# Patient Record
Sex: Male | Born: 1955
Health system: Southern US, Community
[De-identification: ages and names within clinical notes are randomized; demographics above are authoritative.]

## PROBLEM LIST (undated history)

## (undated) DIAGNOSIS — C61 Malignant neoplasm of prostate: Principal | ICD-10-CM

## (undated) DIAGNOSIS — C419 Malignant neoplasm of bone and articular cartilage, unspecified: Secondary | ICD-10-CM

## (undated) DIAGNOSIS — I82409 Acute embolism and thrombosis of unspecified deep veins of unspecified lower extremity: Secondary | ICD-10-CM

## (undated) DIAGNOSIS — C801 Malignant (primary) neoplasm, unspecified: Secondary | ICD-10-CM

## (undated) DIAGNOSIS — N200 Calculus of kidney: Secondary | ICD-10-CM

## (undated) DIAGNOSIS — Z800 Family history of malignant neoplasm of digestive organs: Secondary | ICD-10-CM

## (undated) DIAGNOSIS — M1990 Unspecified osteoarthritis, unspecified site: Secondary | ICD-10-CM

## (undated) DIAGNOSIS — Z8042 Family history of malignant neoplasm of prostate: Secondary | ICD-10-CM

## (undated) DIAGNOSIS — C7951 Secondary malignant neoplasm of bone: Secondary | ICD-10-CM

## (undated) HISTORY — DX: Secondary malignant neoplasm of bone: C79.51

## (undated) HISTORY — DX: Malignant neoplasm of prostate: C61

## (undated) HISTORY — DX: Family history of malignant neoplasm of digestive organs: Z80.0

## (undated) HISTORY — PX: CIRCUMCISION: SUR203

## (undated) HISTORY — DX: Family history of malignant neoplasm of prostate: Z80.42

---

## 2002-08-04 DIAGNOSIS — I82409 Acute embolism and thrombosis of unspecified deep veins of unspecified lower extremity: Secondary | ICD-10-CM

## 2002-08-04 HISTORY — DX: Acute embolism and thrombosis of unspecified deep veins of unspecified lower extremity: I82.409

## 2004-01-22 ENCOUNTER — Inpatient Hospital Stay (HOSPITAL_COMMUNITY): Admission: EM | Admit: 2004-01-22 | Discharge: 2004-01-27 | Payer: Self-pay | Admitting: Emergency Medicine

## 2006-11-05 ENCOUNTER — Ambulatory Visit (HOSPITAL_COMMUNITY): Admission: RE | Admit: 2006-11-05 | Discharge: 2006-11-05 | Payer: Self-pay | Admitting: *Deleted

## 2012-02-04 ENCOUNTER — Ambulatory Visit (HOSPITAL_COMMUNITY)
Admission: RE | Admit: 2012-02-04 | Discharge: 2012-02-04 | Disposition: A | Discharge status: Home / Self Care | Payer: PRIVATE HEALTH INSURANCE | Source: Ambulatory Visit | Attending: Internal Medicine | Admitting: Internal Medicine

## 2012-02-04 ENCOUNTER — Other Ambulatory Visit (HOSPITAL_COMMUNITY): Payer: Self-pay | Admitting: Internal Medicine

## 2012-02-04 DIAGNOSIS — M76899 Other specified enthesopathies of unspecified lower limb, excluding foot: Secondary | ICD-10-CM

## 2012-02-04 DIAGNOSIS — M79609 Pain in unspecified limb: Secondary | ICD-10-CM

## 2014-09-04 ENCOUNTER — Emergency Department (HOSPITAL_COMMUNITY): Payer: 59

## 2014-09-04 ENCOUNTER — Emergency Department (HOSPITAL_COMMUNITY)
Admission: EM | Admit: 2014-09-04 | Discharge: 2014-09-04 | Disposition: A | Discharge status: Home / Self Care | Payer: 59 | Attending: Emergency Medicine | Admitting: Emergency Medicine

## 2014-09-04 ENCOUNTER — Encounter (HOSPITAL_COMMUNITY): Payer: Self-pay | Admitting: *Deleted

## 2014-09-04 DIAGNOSIS — M1990 Unspecified osteoarthritis, unspecified site: Secondary | ICD-10-CM | POA: Diagnosis not present

## 2014-09-04 DIAGNOSIS — M25559 Pain in unspecified hip: Secondary | ICD-10-CM

## 2014-09-04 DIAGNOSIS — M549 Dorsalgia, unspecified: Secondary | ICD-10-CM | POA: Diagnosis not present

## 2014-09-04 DIAGNOSIS — M25552 Pain in left hip: Secondary | ICD-10-CM | POA: Insufficient documentation

## 2014-09-04 DIAGNOSIS — Z720 Tobacco use: Secondary | ICD-10-CM | POA: Insufficient documentation

## 2014-09-04 DIAGNOSIS — R200 Anesthesia of skin: Secondary | ICD-10-CM | POA: Insufficient documentation

## 2014-09-04 DIAGNOSIS — Z86718 Personal history of other venous thrombosis and embolism: Secondary | ICD-10-CM | POA: Insufficient documentation

## 2014-09-04 HISTORY — DX: Unspecified osteoarthritis, unspecified site: M19.90

## 2014-09-04 HISTORY — DX: Acute embolism and thrombosis of unspecified deep veins of unspecified lower extremity: I82.409

## 2014-09-04 MED ORDER — IBUPROFEN 800 MG PO TABS
800.0000 mg | ORAL_TABLET | Freq: Once | ORAL | Status: AC
  Administered 2014-09-04: 800 mg via ORAL
  Filled 2014-09-04: qty 1

## 2014-09-04 MED ORDER — IBUPROFEN 600 MG PO TABS: 600.0000 mg | ORAL_TABLET | Freq: Three times a day (TID) | ORAL | Status: DC | PRN

## 2014-09-04 MED ORDER — HYDROMORPHONE HCL 2 MG/ML IJ SOLN
2.0000 mg | Freq: Once | INTRAMUSCULAR | Status: AC
  Administered 2014-09-04: 2 mg via INTRAMUSCULAR
  Filled 2014-09-04: qty 1

## 2014-09-04 MED ORDER — HYDROCODONE-ACETAMINOPHEN 5-325 MG PO TABS: 1.0000 | ORAL_TABLET | ORAL | Status: DC | PRN

## 2014-09-04 NOTE — Discharge Instructions (Signed)

## 2014-09-04 NOTE — ED Provider Notes (Signed)
CSN: [id number]     Arrival date & time 09/04/14  2115 History  This chart was scribed for Matthew Cable, MD by Chester Holstein, ED Scribe. This patient was seen in room APA19/APA19 and the patient's care was started at 9:57 PM.    Chief Complaint  Patient presents with  . Hip Pain    Patient is a 59 y.o. male presenting with hip pain. The history is provided by the patient. No language interpreter was used.  Hip Pain This is a new problem. The current episode started more than 1 week ago. The problem occurs constantly. The problem has not changed since onset.Pertinent negatives include no abdominal pain. The symptoms are aggravated by bending and twisting. The symptoms are relieved by heat.    HPI Comments: TYJON BOWEN is a 59 y.o. male with PMHx of DVT, arthritis, and prostate problems, who presents to the Emergency Department complaining of left hip pain with onset 2.5 weeks. Pt notes he does heavy lifting at work. Pt notes pain radiates to back, flank, and down leg. Pt notes associated numbness in toes but denies weakness in legs. Pt has been using heating pads for relief.  Pt denies any known injury, weakness, urinary incontinence, cough,and abdominal pain.  Reports h/o DVT but has completed therapy in 2012   Past Medical History  Diagnosis Date  . DVT (deep venous thrombosis)   . Arthritis    History reviewed. No pertinent past surgical history. History reviewed. No pertinent family history. History  Substance Use Topics  . Smoking status: Current Every Day Smoker  . Smokeless tobacco: Not on file  . Alcohol Use: Yes     Comment: occ    Review of Systems  Respiratory: Negative for cough.   Gastrointestinal: Negative for abdominal pain.  Musculoskeletal: Positive for myalgias, back pain and arthralgias.  Neurological: Positive for numbness. Negative for weakness.  All other systems reviewed and are negative.     Allergies  Review of patient's allergies indicates  not on file.  Home Medications   Prior to Admission medications   Not on File   BP 131/87 mmHg  Pulse 92  Temp(Src) 98.6 F (37 C) (Oral)  Resp 20  Ht 6' 1.5" (1.867 m)  Wt 256 lb (116.121 kg)  BMI 33.31 kg/m2  SpO2 98% Physical Exam  Nursing note and vitals reviewed. CONSTITUTIONAL: Well developed/well nourished HEAD: Normocephalic/atraumatic EYES: EOMI/PERRL ENMT: Mucous membranes moist NECK: supple no meningeal signs SPINE/BACK:entire spine nontender, lumbar paraspinal tenderness CV: S1/S2 noted, no murmurs/rubs/gallops noted LUNGS: Lungs are clear to auscultation bilaterally, no apparent distress ABDOMEN: soft, nontender, no rebound or guarding, bowel sounds noted throughout abdomen GU:no cva tenderness NEURO: Pt is awake/alert/appropriate, moves all extremitiesx4.  No facial droop., equal distal motor 5/5 strength noted with the following: hip flexion/knee flexion/extension, ankle dorsi/plantar flexion, great toe extension intact bilaterally, no sensory deficit in any dermatome.   EXTREMITIES: pulses normal/equal, full ROM, tenderness to left hip, no bruising or erythema SKIN: warm, color normal PSYCH: no abnormalities of mood noted, alert and oriented to situation   ED Course  Procedures  DIAGNOSTIC STUDIES: Oxygen Saturation is 98% on room air, normal by my interpretation.    COORDINATION OF CARE: 10:00 PM Discussed treatment plan with patient at beside, the patient agrees with the plan and has no further questions at this time.  11:43 PM Pt improved He can ambulate unassisted No focal neuro deficits This may represent lumbar radiculopathy Doubt occult fx (no falls,  xray negative) Advised heating pad, short course of ibuprofen, vicodin and close PCP followup We discussed strict return precautions    Imaging Review Dg Lumbar Spine Complete  09/04/2014   CLINICAL DATA:  Left hip pain with radiation into left lower back for 3 weeks. No known injury.  EXAM:  LUMBAR SPINE - COMPLETE 4+ VIEW  COMPARISON:  None.  FINDINGS: Degenerative disc and facet disease in the lower lumbar spine. SI joints are symmetric and unremarkable. Normal alignment. No fracture.  IMPRESSION: No acute bony abnormality.   Electronically Signed   By: Rolm Baptise M.D.   On: 09/04/2014 22:53   Dg Hip Unilat With Pelvis 2-3 Views Left  09/04/2014   CLINICAL DATA:  Left hip pain and left lower back pain for 3 weeks. No known injury.  EXAM: LEFT HIP (WITH PELVIS) 2-3 VIEWS  COMPARISON:  None.  FINDINGS: Mild joint space narrowing in the hips bilaterally, symmetric. SI joints are symmetric and unremarkable.  No acute bony abnormality. Specifically, no fracture, subluxation, or dislocation. Soft tissues are intact.  IMPRESSION: No acute bony abnormality.   Electronically Signed   By: Rolm Baptise M.D.   On: 09/04/2014 22:55    Medications  HYDROmorphone (DILAUDID) injection 2 mg (2 mg Intramuscular Given 09/04/14 2216)  ibuprofen (ADVIL,MOTRIN) tablet 800 mg (800 mg Oral Given 09/04/14 2331)     MDM   Final diagnoses:  Hip pain  Hip pain, acute, left    Nursing notes including past medical history and social history reviewed and considered in documentation xrays/imaging reviewed by myself and considered during evaluation   I personally performed the services described in this documentation, which was scribed in my presence. The recorded information has been reviewed and is accurate.      Matthew Cable, MD 09/04/14 [phone]

## 2014-09-04 NOTE — ED Notes (Signed)
Pain left hip for 3 weeks. With radiation into back and down lt leg. No injury.

## 2015-12-10 ENCOUNTER — Emergency Department (HOSPITAL_COMMUNITY): Payer: 59

## 2015-12-10 ENCOUNTER — Emergency Department (HOSPITAL_COMMUNITY)
Admission: EM | Admit: 2015-12-10 | Discharge: 2015-12-10 | Disposition: A | Discharge status: Home / Self Care | Payer: 59 | Attending: Emergency Medicine | Admitting: Emergency Medicine

## 2015-12-10 ENCOUNTER — Encounter (HOSPITAL_COMMUNITY): Payer: Self-pay | Admitting: Emergency Medicine

## 2015-12-10 DIAGNOSIS — Z7982 Long term (current) use of aspirin: Secondary | ICD-10-CM | POA: Diagnosis not present

## 2015-12-10 DIAGNOSIS — Z791 Long term (current) use of non-steroidal anti-inflammatories (NSAID): Secondary | ICD-10-CM | POA: Diagnosis not present

## 2015-12-10 DIAGNOSIS — M899 Disorder of bone, unspecified: Secondary | ICD-10-CM | POA: Insufficient documentation

## 2015-12-10 DIAGNOSIS — F17210 Nicotine dependence, cigarettes, uncomplicated: Secondary | ICD-10-CM | POA: Insufficient documentation

## 2015-12-10 DIAGNOSIS — Z86718 Personal history of other venous thrombosis and embolism: Secondary | ICD-10-CM | POA: Insufficient documentation

## 2015-12-10 DIAGNOSIS — R0789 Other chest pain: Secondary | ICD-10-CM

## 2015-12-10 DIAGNOSIS — M1990 Unspecified osteoarthritis, unspecified site: Secondary | ICD-10-CM | POA: Insufficient documentation

## 2015-12-10 DIAGNOSIS — Z79899 Other long term (current) drug therapy: Secondary | ICD-10-CM | POA: Insufficient documentation

## 2015-12-10 LAB — URINALYSIS W MICROSCOPIC (NOT AT ARMC)
Bilirubin Urine: NEGATIVE
GLUCOSE, UA: NEGATIVE mg/dL
HGB URINE DIPSTICK: NEGATIVE
Ketones, ur: NEGATIVE mg/dL
Leukocytes, UA: NEGATIVE
NITRITE: NEGATIVE
PROTEIN: NEGATIVE mg/dL
Specific Gravity, Urine: <1.005 — ABNORMAL LOW (ref 1.005–1.030)
Squamous Epithelial / LPF: NONE SEEN
pH: 5.5 (ref 5.0–8.0)

## 2015-12-10 LAB — CBC
HCT: 42.2 % (ref 39.0–52.0)
HEMOGLOBIN: 14 g/dL (ref 13.0–17.0)
MCH: 28.9 pg (ref 26.0–34.0)
MCHC: 33.2 g/dL (ref 30.0–36.0)
MCV: 87.2 fL (ref 78.0–100.0)
PLATELETS: 242 10*3/uL (ref 150–400)
RBC: 4.84 MIL/uL (ref 4.22–5.81)
RDW: 13.2 % (ref 11.5–15.5)
WBC: 8.7 10*3/uL (ref 4.0–10.5)

## 2015-12-10 LAB — DIFFERENTIAL
BASOS PCT: 0 %
Basophils Absolute: 0 10*3/uL (ref 0.0–0.1)
EOS PCT: 3 %
Eosinophils Absolute: 0.3 10*3/uL (ref 0.0–0.7)
Lymphocytes Relative: 26 %
Lymphs Abs: 2.2 10*3/uL (ref 0.7–4.0)
MONO ABS: 0.7 10*3/uL (ref 0.1–1.0)
Monocytes Relative: 8 %
NEUTROS ABS: 5.5 10*3/uL (ref 1.7–7.7)
NEUTROS PCT: 63 %

## 2015-12-10 LAB — BASIC METABOLIC PANEL
ANION GAP: 12 (ref 5–15)
BUN: 10 mg/dL (ref 6–20)
CALCIUM: 8.8 mg/dL — AB (ref 8.9–10.3)
CO2: 23 mmol/L (ref 22–32)
CREATININE: 0.96 mg/dL (ref 0.61–1.24)
Chloride: 101 mmol/L (ref 101–111)
Glucose, Bld: 116 mg/dL — ABNORMAL HIGH (ref 65–99)
Potassium: 3.7 mmol/L (ref 3.5–5.1)
SODIUM: 136 mmol/L (ref 135–145)

## 2015-12-10 LAB — BRAIN NATRIURETIC PEPTIDE: B NATRIURETIC PEPTIDE 5: 19 pg/mL (ref 0.0–100.0)

## 2015-12-10 LAB — TROPONIN I: Troponin I: <0.03 ng/mL (ref <0.031)

## 2015-12-10 LAB — D-DIMER, QUANTITATIVE (NOT AT ARMC): D DIMER QUANT: 1.12 ug{FEU}/mL — AB (ref 0.00–0.50)

## 2015-12-10 MED ORDER — HYDROMORPHONE HCL 1 MG/ML IJ SOLN
0.5000 mg | Freq: Once | INTRAMUSCULAR | Status: AC
  Administered 2015-12-10: 0.5 mg via INTRAVENOUS
  Filled 2015-12-10: qty 1

## 2015-12-10 MED ORDER — IOPAMIDOL (ISOVUE-370) INJECTION 76%
100.0000 mL | Freq: Once | INTRAVENOUS | Status: AC | PRN
  Administered 2015-12-10: 100 mL via INTRAVENOUS

## 2015-12-10 MED ORDER — HYDROCODONE-ACETAMINOPHEN 5-325 MG PO TABS: 1.0000 | ORAL_TABLET | ORAL | Status: DC | PRN

## 2015-12-10 MED ORDER — FENTANYL CITRATE (PF) 100 MCG/2ML IJ SOLN
100.0000 ug | Freq: Once | INTRAMUSCULAR | Status: AC
  Administered 2015-12-10: 100 ug via INTRAVENOUS
  Filled 2015-12-10: qty 2

## 2015-12-10 NOTE — ED Provider Notes (Signed)
CSN: [id number]     Arrival date & time 12/10/15  [id number] History   First MD Initiated Contact with Patient 12/10/15 [phone]     Chief Complaint  Patient presents with  . Chest Pain     (Consider location/radiation/quality/duration/timing/severity/associated sxs/prior Treatment) HPI 60 year old male Who presents with chest pain. He has a history of DVT, no longer on anticoagulation and history of tobacco use. States onset of intermittent chest pressure and pain over the past 5 days. He has a very strenuous job working in [employer], and states that he has been doing a lot of physical labor. Pain has been off and on, worse with movement, and better when is is not moving. Not exacerbated by walking. 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 Denies any syncope, shortness of breath, diaphoresis, nausea or vomiting. 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 Has chronic left lower extremity edema since his DVT. This is not changed. No recent illness, including fever, cough, congestion.   Past Medical History  Diagnosis Date  . DVT (deep venous thrombosis) ([place])   . Arthritis    History reviewed. No pertinent past surgical history. History reviewed. No pertinent family history. Social History  Substance Use Topics  . Smoking status: Current Every Day Smoker -- 1.50 packs/day    Types: Cigarettes  . Smokeless tobacco: None  . Alcohol Use: Yes     Comment: occ    Review of Systems 10/14 systems reviewed and are negative other than those stated in the HPI    Allergies  Meloxicam  Home Medications   Prior to Admission medications   Medication Sig Start Date End Date Taking? Authorizing Provider  aspirin EC 81 MG tablet Take 81 mg by mouth daily.   Yes Historical Provider, MD  ibuprofen (ADVIL,MOTRIN) 600 MG tablet Take 300 mg by mouth 2 (two) times daily as needed for moderate pain.   Yes Historical Provider, MD   Liniments (BLUE-EMU SUPER STRENGTH EX) Apply 1 application topically 4 (four) times daily as needed (knee/hip pain).   Yes Historical Provider, MD  HYDROcodone-acetaminophen (NORCO/VICODIN) 5-325 MG tablet Take 1-2 tablets by mouth every 4 (four) hours as needed. 12/10/15   Forde Dandy, MD   BP 112/76 mmHg  Pulse 74  Temp(Src) 97.8 F (36.6 C) (Oral)  Resp 12  Ht 6\' 1"  (1.854 m)  Wt 255 lb (115.667 kg)  BMI 33.65 kg/m2  SpO2 96% Physical Exam Physical Exam  Nursing note and vitals reviewed. Constitutional: Well developed, well nourished, non-toxic, and appears uncomfortable Head: Normocephalic and atraumatic.  Mouth/Throat: Oropharynx is clear and moist.  Neck: Normal range of motion. Neck supple.  Cardiovascular: Normal rate and regular rhythm. LLE +1 edema  (he reports baseline) Pulmonary/Chest: Effort normal and breath sounds normal.  Abdominal: Soft. There is no tenderness. There is no rebound and no guarding.  Musculoskeletal: Normal range of motion.  Neurological: Alert, no facial droop, fluent speech, moves all extremities symmetrically Skin: Skin is warm and dry.  Psychiatric: Cooperative  ED Course  Procedures (including critical care time) Labs Review Labs Reviewed  BASIC METABOLIC PANEL - Abnormal; Notable for the following:    Glucose, Bld 116 (*)    Calcium 8.8 (*)    All other components within normal limits  D-DIMER, QUANTITATIVE (NOT AT [facility]) - Abnormal; Notable for the following:    D-Dimer, Quant 1.12 (*)    All other components within normal limits  URINALYSIS W MICROSCOPIC (NOT AT [facility]) - Abnormal; Notable for the following:    Specific Gravity, Urine <1.005 (*)    Bacteria, UA RARE (*)    All other components within normal limits  CBC  TROPONIN I  DIFFERENTIAL  BRAIN NATRIURETIC PEPTIDE  TROPONIN I    Imaging Review Dg Chest 2 View  12/10/2015  CLINICAL DATA:  Chest pressure for 5 days with progression while at work last night. Increasing  shortness of breath. Pain is worse with movement. EXAM: CHEST - 2 VIEW COMPARISON:  One-view chest x-ray 11/05/2006 FINDINGS: The heart size is normal. Emphysematous changes are again noted. Left upper lobe airspace opacity is most consistent with pneumonia. Multilevel degenerative changes are present in the thoracic spine. IMPRESSION: 1. Left upper lobe airspace disease compatible with pneumonia. 2. Emphysema. Electronically Signed   By: San Morelle M.D.   On: 12/10/2015 08:01   Ct Angio Chest Pe W/cm &/or Wo Cm  12/10/2015  CLINICAL DATA:  PT states center chest pressure x5 days and worsening last night while at work with SOB. PT states pain worse with movement EXAM: CT ANGIOGRAPHY CHEST WITH CONTRAST TECHNIQUE: Multidetector CT imaging of the chest was performed using the standard protocol during bolus administration of intravenous contrast. Multiplanar CT image reconstructions and MIPs were obtained to evaluate the vascular anatomy. CONTRAST:  100 mL Isovue 370 IV COMPARISON:  None. FINDINGS: Vascular: Left arm IV contrast injection. Innominate vein and SVC patent. Right atrium and right ventricle are nondilated. Satisfactory opacification of pulmonary arteries noted, and there is no evidence of pulmonary emboli. Patent bilateral pulmonary veins drain into left atrium. Scattered coronary calcifications. Adequate contrast opacification of the thoracic aorta with no evidence of dissection, aneurysm, or stenosis. There is classic 3-vessel brachiocephalic arch anatomy without proximal stenosis. Mild eccentric nonocclusive plaque in the descending thoracic aorta. Mediastinum/Lymph Nodes: No masses or pathologically enlarged lymph nodes identified. No pericardial effusion. Lungs/Pleura: No pneumothorax. No pulmonary mass, infiltrate, or effusion.Mild posterior dependent subsegmental atelectasis or scarring in the posterior left upper lobe and bilateral lower lobes. Upper abdomen: No acute findings.  Musculoskeletal: Mixed sclerotic/lytic lesions in multiple upper and mid thoracic vertebral bodies, with involvement of posterior elements extensively at T2, T3, and T4. Negative for pathologic fracture. Sclerotic lesions in left fourth, fifth, and sixth ribs. Subcentimeter sclerotic foci in the manubrium. Review of the MIP images confirms the above findings. IMPRESSION: 1. Negative for acute PE or thoracic aortic dissection. 2. Multiple thoracic and rib osseous lesions suggesting osseous metastatic disease. No primary evident. Follow-up recommended. These results will be called to the ordering clinician or representative by the Radiologist Assistant, and communication documented in the PACS or zVision Dashboard. 3. Atherosclerosis, including aortic and coronary artery disease. Please note that although the presence of coronary artery calcium documents the presence of coronary artery disease, the severity of this disease and any potential stenosis cannot be assessed on this non-gated CT examination. Assessment for potential risk factor modification, dietary therapy or pharmacologic therapy may be warranted, if clinically indicated. Electronically Signed   By: Lucrezia Europe M.D.   On: 12/10/2015 10:23   I have personally reviewed and evaluated these images and lab results as part of my medical decision-making.   EKG Interpretation   Date/Time:  Monday Dec 10 2015 07:09:46 EDT Ventricular Rate:  95 PR Interval:  182 QRS Duration: 90 QT Interval:  357 QTC Calculation: 449 R Axis:   47 Text Interpretation:  Sinus rhythm Low voltage, extremity  leads No acute  ischemic changes. No prior EKG  Confirmed by Brondon Wann MD, Arleny Kruger [phone]) on  12/10/2015 7:12:24 AM      MDM   Final diagnoses:  Atypical chest pain  Bone lesion    60 year old male with history of DVT and tobacco use who presents with 5 days of chest pain. On presentation is nontoxic and in no acute distress but appears to be uncomfortable secondary  to pain. His vital signs are stable. Cardiopulmonary exam overall unremarkable and he does have some left lower extremity leg swelling compared to the right which she states is baseline. Pain seems to be reproducible with movement and palpation over anterior chest wall.  His EKG is without heart strain or ischemic changes. Serial troponins are negative. Heart score of 2 for age and tobacco use. But pain seems atypical for that of ACS and more consistent with that of more musculoskeletal pain. I did screen for PE and he has a positive d-dimer. A CT PE was performed. He has no PE but evidence of bony lesions throughout his spine as well as ribs, which could also play a role in his chest pain. These lesions are concerning for possible cancer. The patient states long-standing history of stage issues including urinary hesitancy, frequency. his UA looks normal today. The pain has been well controlled with analgesics. He has plans to follow up very closely with his primary care doctor for workup of potential malignant cause and he is also given urology referral for workup of potential prostate malignancy as etiology. Strict return and follow-up instructions reviewed. He expressed understanding of all discharge instructions and felt comfortable with the plan of care.    Forde Dandy, MD 12/10/15 [phone]

## 2015-12-10 NOTE — Discharge Instructions (Signed)
Please return without fail for worsening symptoms, including worsening pain, difficulty breathing, passing out, or any other symptoms concerning to you. Please call your primary care doctor tomorrow to set up very close follow-up appointment for further workup and management. He is also been given urology follow-up for workup of prostate issues. We discussed that your CT chest today was concerning for bony lesions in the ribs and thoracic spine that could be cancerous in nature and Require further workup with her primary care doctor for this.  Chest Wall Pain Chest wall pain is pain in or around the bones and muscles of your chest. Sometimes, an injury causes this pain. Sometimes, the cause may not be known. This pain may take several weeks or longer to get better. HOME CARE INSTRUCTIONS  Pay attention to any changes in your symptoms. Take these actions to help with your pain:   Rest as told by your health care provider.   Avoid activities that cause pain. These include any activities that use your chest muscles or your abdominal and side muscles to lift heavy items.   If directed, apply ice to the painful area:  Put ice in a plastic bag.  Place a towel between your skin and the bag.  Leave the ice on for 20 minutes, 2-3 times per day.  Take over-the-counter and prescription medicines only as told by your health care provider.  Do not use tobacco products, including cigarettes, chewing tobacco, and e-cigarettes. If you need help quitting, ask your health care provider.  Keep all follow-up visits as told by your health care provider. This is important. SEEK MEDICAL CARE IF:  You have a fever.  Your chest pain becomes worse.  You have new symptoms. SEEK IMMEDIATE MEDICAL CARE IF:  You have nausea or vomiting.  You feel sweaty or light-headed.  You have a cough with phlegm (sputum) or you cough up blood.  You develop shortness of breath.   This information is not intended to  replace advice given to you by your health care provider. Make sure you discuss any questions you have with your health care provider.   Document Released: 07/21/2005 Document Revised: 04/11/2015 Document Reviewed: 10/16/2014 Elsevier Interactive Patient Education 2016 [employer]  Nonspecific Chest Pain It is often hard to find the cause of chest pain. There is always a chance that your pain could be related to something serious, such as a heart attack or a blood clot in your lungs. Chest pain can also be caused by conditions that are not life-threatening. If you have chest pain, it is very important to follow up with your doctor.  HOME CARE  If you were prescribed an antibiotic medicine, finish it all even if you start to feel better.  Avoid any activities that cause chest pain.  Do not use any tobacco products, including cigarettes, chewing tobacco, or electronic cigarettes. If you need help quitting, ask your doctor.  Do not drink alcohol.  Take medicines only as told by your doctor.  Keep all follow-up visits as told by your doctor. This is important. This includes any further testing if your chest pain does not go away.  Your doctor may tell you to keep your head raised (elevated) while you sleep.  Make lifestyle changes as told by your doctor. These may include:  Getting regular exercise. Ask your doctor to suggest some activities that are safe for you.  Eating a heart-healthy diet. Your doctor or a diet specialist (dietitian) can help you to  learn healthy eating options.  Maintaining a healthy weight.  Managing diabetes, if necessary.  Reducing stress. GET HELP IF:  Your chest pain does not go away, even after treatment.  You have a rash with blisters on your chest.  You have a fever. GET HELP RIGHT AWAY IF:  Your chest pain is worse.  You have an increasing cough, or you cough up blood.  You have severe belly (abdominal) pain.  You feel extremely  weak.  You pass out (faint).  You have chills.  You have sudden, unexplained chest discomfort.  You have sudden, unexplained discomfort in your arms, back, neck, or jaw.  You have shortness of breath at any time.  You suddenly start to sweat, or your skin gets clammy.  You feel nauseous.  You vomit.  You suddenly feel light-headed or dizzy.  Your heart begins to beat quickly, or it feels like it is skipping beats. These symptoms may be an emergency. Do not wait to see if the symptoms will go away. Get medical help right away. Call your local emergency services (911 in the [place] Do not drive yourself to the hospital.   This information is not intended to replace advice given to you by your health care provider. Make sure you discuss any questions you have with your health care provider.   Document Released: 01/07/2008 Document Revised: 08/11/2014 Document Reviewed: 02/24/2014 Elsevier Interactive Patient Education [employer].

## 2015-12-10 NOTE — ED Notes (Addendum)
PT states center chest pressure x5 days and worsening last night while at work with SOB. PT states pain worse with movement.

## 2015-12-11 ENCOUNTER — Other Ambulatory Visit (HOSPITAL_COMMUNITY): Payer: Self-pay | Admitting: Oncology

## 2015-12-11 DIAGNOSIS — R9389 Abnormal findings on diagnostic imaging of other specified body structures: Secondary | ICD-10-CM

## 2015-12-12 ENCOUNTER — Encounter (HOSPITAL_COMMUNITY): Payer: PRIVATE HEALTH INSURANCE | Attending: Hematology & Oncology

## 2015-12-12 ENCOUNTER — Ambulatory Visit (HOSPITAL_COMMUNITY)
Admission: RE | Admit: 2015-12-12 | Discharge: 2015-12-12 | Disposition: A | Discharge status: Home / Self Care | Payer: 59 | Source: Ambulatory Visit | Attending: Oncology | Admitting: Oncology

## 2015-12-12 DIAGNOSIS — I82409 Acute embolism and thrombosis of unspecified deep veins of unspecified lower extremity: Secondary | ICD-10-CM | POA: Diagnosis not present

## 2015-12-12 DIAGNOSIS — Z7982 Long term (current) use of aspirin: Secondary | ICD-10-CM | POA: Insufficient documentation

## 2015-12-12 DIAGNOSIS — F17210 Nicotine dependence, cigarettes, uncomplicated: Secondary | ICD-10-CM | POA: Insufficient documentation

## 2015-12-12 DIAGNOSIS — M899 Disorder of bone, unspecified: Secondary | ICD-10-CM | POA: Insufficient documentation

## 2015-12-12 DIAGNOSIS — C7951 Secondary malignant neoplasm of bone: Secondary | ICD-10-CM | POA: Insufficient documentation

## 2015-12-12 DIAGNOSIS — Z79899 Other long term (current) drug therapy: Secondary | ICD-10-CM | POA: Insufficient documentation

## 2015-12-12 DIAGNOSIS — I714 Abdominal aortic aneurysm, without rupture: Secondary | ICD-10-CM | POA: Diagnosis not present

## 2015-12-12 DIAGNOSIS — R3911 Hesitancy of micturition: Secondary | ICD-10-CM | POA: Insufficient documentation

## 2015-12-12 DIAGNOSIS — M1990 Unspecified osteoarthritis, unspecified site: Secondary | ICD-10-CM | POA: Diagnosis not present

## 2015-12-12 DIAGNOSIS — N400 Enlarged prostate without lower urinary tract symptoms: Secondary | ICD-10-CM | POA: Insufficient documentation

## 2015-12-12 DIAGNOSIS — R634 Abnormal weight loss: Secondary | ICD-10-CM | POA: Insufficient documentation

## 2015-12-12 DIAGNOSIS — R9389 Abnormal findings on diagnostic imaging of other specified body structures: Secondary | ICD-10-CM

## 2015-12-12 DIAGNOSIS — R972 Elevated prostate specific antigen [PSA]: Secondary | ICD-10-CM | POA: Diagnosis not present

## 2015-12-12 DIAGNOSIS — R938 Abnormal findings on diagnostic imaging of other specified body structures: Secondary | ICD-10-CM | POA: Insufficient documentation

## 2015-12-12 LAB — LACTATE DEHYDROGENASE: LDH: 132 U/L (ref 98–192)

## 2015-12-12 LAB — PSA: PSA: 153.85 ng/mL — AB (ref 0.00–4.00)

## 2015-12-12 MED ORDER — IOPAMIDOL (ISOVUE-300) INJECTION 61%
100.0000 mL | Freq: Once | INTRAVENOUS | Status: AC | PRN
  Administered 2015-12-12: 100 mL via INTRAVENOUS

## 2015-12-13 ENCOUNTER — Encounter (HOSPITAL_BASED_OUTPATIENT_CLINIC_OR_DEPARTMENT_OTHER): Payer: PRIVATE HEALTH INSURANCE | Admitting: Hematology & Oncology

## 2015-12-13 ENCOUNTER — Encounter (HOSPITAL_COMMUNITY): Payer: Self-pay | Admitting: Hematology & Oncology

## 2015-12-13 VITALS — BP 125/80 | HR 79 | Temp 98.5°F | Resp 18 | Ht 73.0 in | Wt 239.8 lb

## 2015-12-13 DIAGNOSIS — R079 Chest pain, unspecified: Secondary | ICD-10-CM

## 2015-12-13 DIAGNOSIS — C7951 Secondary malignant neoplasm of bone: Secondary | ICD-10-CM | POA: Diagnosis not present

## 2015-12-13 DIAGNOSIS — C801 Malignant (primary) neoplasm, unspecified: Secondary | ICD-10-CM | POA: Diagnosis not present

## 2015-12-13 DIAGNOSIS — R350 Frequency of micturition: Secondary | ICD-10-CM

## 2015-12-13 DIAGNOSIS — R634 Abnormal weight loss: Secondary | ICD-10-CM

## 2015-12-13 DIAGNOSIS — Z720 Tobacco use: Secondary | ICD-10-CM

## 2015-12-13 DIAGNOSIS — R938 Abnormal findings on diagnostic imaging of other specified body structures: Secondary | ICD-10-CM

## 2015-12-13 DIAGNOSIS — G893 Neoplasm related pain (acute) (chronic): Secondary | ICD-10-CM

## 2015-12-13 DIAGNOSIS — Z86718 Personal history of other venous thrombosis and embolism: Secondary | ICD-10-CM

## 2015-12-13 DIAGNOSIS — R3911 Hesitancy of micturition: Secondary | ICD-10-CM

## 2015-12-13 DIAGNOSIS — R972 Elevated prostate specific antigen [PSA]: Secondary | ICD-10-CM | POA: Diagnosis not present

## 2015-12-13 LAB — PROTEIN ELECTROPHORESIS, SERUM
A/G Ratio: 1.3 (ref 0.7–1.7)
ALPHA-1-GLOBULIN: 0.3 g/dL (ref 0.0–0.4)
Albumin ELP: 3.8 g/dL (ref 2.9–4.4)
Alpha-2-Globulin: 0.8 g/dL (ref 0.4–1.0)
Beta Globulin: 1 g/dL (ref 0.7–1.3)
GAMMA GLOBULIN: 1 g/dL (ref 0.4–1.8)
Globulin, Total: 3 g/dL (ref 2.2–3.9)
TOTAL PROTEIN ELP: 6.8 g/dL (ref 6.0–8.5)

## 2015-12-13 LAB — IGG, IGA, IGM
IGG (IMMUNOGLOBIN G), SERUM: 996 mg/dL (ref 700–1600)
IgA: 254 mg/dL (ref 90–386)
IgM, Serum: 130 mg/dL (ref 20–172)

## 2015-12-13 LAB — KAPPA/LAMBDA LIGHT CHAINS
KAPPA FREE LGHT CHN: 23.65 mg/L — AB (ref 3.30–19.40)
Kappa, lambda light chain ratio: 1.46 (ref 0.26–1.65)
Lambda free light chains: 16.23 mg/L (ref 5.71–26.30)

## 2015-12-13 LAB — BETA 2 MICROGLOBULIN, SERUM: Beta-2 Microglobulin: 1.9 mg/L (ref 0.6–2.4)

## 2015-12-13 MED ORDER — HYDROCODONE-ACETAMINOPHEN 5-325 MG PO TABS: 1.0000 | ORAL_TABLET | ORAL | Status: DC | PRN

## 2015-12-13 NOTE — Patient Instructions (Signed)
[place] at [facility]  Discharge Instructions:  Seen and evaluated by Dr. Whitney Muse today.  Bone Scan and Bone biopsy for next week.  Return for office visit here in 2 weeks.  _______________________________________________________________  Thank you for choosing [place] at [facility] to provide your oncology and hematology care.  To afford each patient quality time with our providers, please arrive at least 15 minutes before your scheduled appointment.  You need to re-schedule your appointment if you arrive 10 or more minutes late.  We strive to give you quality time with our providers, and arriving late affects you and other patients whose appointments are after yours.  Also, if you no show three or more times for appointments you may be dismissed from the clinic.  Again, thank you for choosing [place] [facility] [place] hope is that these requests will allow you access to exceptional care and in a timely manner. _______________________________________________________________  If you have questions after your visit, please contact our office at [phone] between the hours of 8:30 a.m. and 5:00 p.m. Voicemails left after 4:30 p.m. will not be returned until the following business day. _______________________________________________________________  For prescription refill requests, have your pharmacy contact our office. _______________________________________________________________  Recommendations made by the consultant and any test results will be sent to your referring physician. _______________________________________________________________

## 2015-12-13 NOTE — Progress Notes (Signed)
[facility]  Progress Note  Patient Care Team: [employer], MD as PCP - General (Internal Medicine)  CHIEF COMPLAINTS/PURPOSE OF CONSULTATION:  CT angiography chest w/contrast 12/10/2015 multiple thoracic and rib osseous lesions suggesting osseous metastatic disease. No obvious primary PSA 12/12/2015 153.85  HISTORY OF PRESENTING ILLNESS:  Matthew [name] 60 y.o. male is here because of imaging showing widespread bony metastatic disease. PSA obtained on 12/12/2015 was 153.85. He has no prior known history of prostate cancer. He does report urinary urgency and frequency. Occasional urinary hesitancy.  He presented to the ED on 12/10/2015 with chest pain. Known history of DVT, given his history CTA was performed. D-dimer was positive. EKG was without heart strain or ischemia. Serial troponins were negative. Patient was advised of bone findings on CTA.  He presents today with his two daughters. He underwent CT abdomen/pelvis and laboratory studies (including PSA) prior to his appointment today.   He also notes that he was seen in the ED on 09/04/2014 for hip pain. Plain films were unremarkable.   He has a history of blood clots and DVT in 2004. He has had a big blood clot in his groin area once before.  His chest pain still hurts him now. The pain passes quickly but he says each time it lasts longer than before. Pain is sharp and can "take his breath away." Movement can make it worse. He has hydrocodone and takes 1/2 tablet which can help ease his discomfort.  His hip hurts him some now.   His L leg "swells". He puts an ice pack on this and this helps. He notes this is chronic "for years" and attributes it to a bad knee.   He does not like to go to the doctor and does not go unless he has to.   He smokes a pack a day. He notes he has lost approximately 20 pounds since the fall. 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  Currently he drives to [facility] and stays there in a hotel during the week.   MEDICAL HISTORY:  Past Medical History  Diagnosis Date  . DVT (deep venous thrombosis) ([place])   . Arthritis     SURGICAL HISTORY: History reviewed. No pertinent past surgical history.  SOCIAL HISTORY: Social History   Social History  . Marital Status: Single    Spouse Name: N/A  . Number of Children: N/A  . Years of Education: N/A   Occupational History  . Not on file.   Social History Main Topics  . Smoking status: Current Every Day Smoker -- 1.50 packs/day    Types: Cigarettes  . Smokeless tobacco: Not on file  . Alcohol Use: Yes     Comment: occ  . Drug Use: No  . Sexual Activity: Not on file   Other Topics Concern  . Not on file   Social History Narrative  Divorced for 63 years 2 girls 4 grandchildren Smokes pack a day; Started at 60 years old Beer every once in a while  Hobbies-plays pool Works in Architect  FAMILY HISTORY: No family history on file.  Grandmother (adopted him) Mother died in 68s from bone cancer Never met father Brothers and sisters all died from cancer 3 brother 3 sisters   ALLERGIES:  is allergic to meloxicam.  MEDICATIONS:  Current Outpatient Prescriptions  Medication Sig Dispense Refill  . aspirin EC 81 MG tablet Take 81 mg by  mouth daily.    Marland Kitchen HYDROcodone-acetaminophen (NORCO/VICODIN) 5-325 MG tablet Take 1-2 tablets by mouth every 4 (four) hours as needed. 15 tablet 0  . ibuprofen (ADVIL,MOTRIN) 600 MG tablet Take 300 mg by mouth 2 (two) times daily as needed for moderate pain.    . Liniments (BLUE-EMU SUPER STRENGTH EX) Apply 1 application topically 4 (four) times daily as needed (knee/hip pain).     No current facility-administered medications for this visit.    Review of Systems  Constitutional: Positive for weight loss.  HENT: Negative.   Eyes: Negative.   Respiratory: Negative.   Cardiovascular: Positive for chest pain.    Gastrointestinal: Negative.   Genitourinary: Positive for dysuria.       Every once in a while Dysuria. Has nocturia goes once or twice a night  Musculoskeletal: Negative.        Hip hurts him   Skin: Negative.   Neurological: Negative.        Left leg swells every day. Puts an ice pack on it and this helps it go down.   Endo/Heme/Allergies: Negative.   Psychiatric/Behavioral: Negative.   All other systems reviewed and are negative.  14 point ROS was done and is otherwise as detailed above or in HPI   PHYSICAL EXAMINATION: ECOG PERFORMANCE STATUS: 1 - Symptomatic but completely ambulatory  Filed Vitals:   12/13/15 1523  BP: 125/80  Pulse: 79  Temp: 98.5 F (36.9 C)  Resp: 18   Filed Weights   12/13/15 1523  Weight: 239 lb 12.8 oz (108.773 kg)    Physical Exam  Constitutional: He is oriented to person, place, and time and well-developed, well-nourished, and in no distress.  HENT:  Head: Normocephalic and atraumatic.  Nose: Nose normal.  Mouth/Throat: Oropharynx is clear and moist. No oropharyngeal exudate.  Poor dentition with multiple missing teeth  Eyes: Conjunctivae and EOM are normal. Pupils are equal, round, and reactive to light. Right eye exhibits no discharge. Left eye exhibits no discharge. No scleral icterus.  Neck: Normal range of motion. Neck supple. No tracheal deviation present. No thyromegaly present.  Cardiovascular: Normal rate, regular rhythm and normal heart sounds.  Exam reveals no gallop and no friction rub.   No murmur heard. Pulmonary/Chest: Effort normal and breath sounds normal. He has no wheezes. He has no rales.  Decreased throughout  Abdominal: Soft. Bowel sounds are normal. He exhibits no distension and no mass. There is no tenderness. There is no rebound and no guarding.  Musculoskeletal: Normal range of motion. He exhibits no edema.  Lymphadenopathy:    He has no cervical adenopathy.  Neurological: He is alert and oriented to person,  place, and time. He has normal reflexes. No cranial nerve deficit. Gait normal. Coordination normal.  Skin: Skin is warm and dry. No rash noted.  Psychiatric: Mood, memory, affect and judgment normal.  Nursing note and vitals reviewed.  LABORATORY DATA:  I have reviewed the data as listed  Results for Matthew [name], Matthew [name] (MRN [id number]) as of 12/13/2015 12:37  Ref. Range 12/10/2015 07:27  Sodium Latest Ref Range: 135-145 mmol/L 136  Potassium Latest Ref Range: 3.5-5.1 mmol/L 3.7  Chloride Latest Ref Range: 101-111 mmol/L 101  CO2 Latest Ref Range: 22-32 mmol/L 23  BUN Latest Ref Range: 6-20 mg/dL 10  Creatinine Latest Ref Range: 0.61-1.24 mg/dL 0.96  Calcium Latest Ref Range: 8.9-10.3 mg/dL 8.8 (L)  EGFR (Non-African Amer.) Latest Ref Range: >60 mL/min >60  EGFR (African American) Latest Ref Range: >60 mL/min >  60  Glucose Latest Ref Range: 65-99 mg/dL 116 (H)  Anion gap Latest Ref Range: 5-15  12  B Natriuretic Peptide Latest Ref Range: 0.0-100.0 pg/mL 19.0  Troponin I Latest Ref Range: <0.031 ng/mL <0.03  WBC Latest Ref Range: 4.0-10.5 K/uL 8.7  RBC Latest Ref Range: 4.22-5.81 MIL/uL 4.84  Hemoglobin Latest Ref Range: 13.0-17.0 g/dL 14.0  HCT Latest Ref Range: 39.0-52.0 % 42.2  MCV Latest Ref Range: 78.0-100.0 fL 87.2  MCH Latest Ref Range: 26.0-34.0 pg 28.9  MCHC Latest Ref Range: 30.0-36.0 g/dL 33.2  RDW Latest Ref Range: 11.5-15.5 % 13.2  Platelets Latest Ref Range: 150-400 K/uL 242  Neutrophils Latest Units: % 63  Lymphocytes Latest Units: % 26  Monocytes Relative Latest Units: % 8  Eosinophil Latest Units: % 3  Basophil Latest Units: % 0  NEUT# Latest Ref Range: 1.7-7.7 K/uL 5.5  Lymphocyte # Latest Ref Range: 0.7-4.0 K/uL 2.2  Monocyte # Latest Ref Range: 0.1-1.0 K/uL 0.7  Eosinophils Absolute Latest Ref Range: 0.0-0.7 K/uL 0.3  Basophils Absolute Latest Ref Range: 0.0-0.1 K/uL 0.0  D-Dimer, Quant Latest Ref Range: 0.00-0.50 ug/mL-FEU 1.12 (H)   Results for Matthew [name], Matthew [name]  (MRN [id number]) as of 12/13/2015 12:37  Ref. Range 12/12/2015 07:50 12/12/2015 07:51  LDH Latest Ref Range: 98-192 U/L  132  Beta-2 Microglobulin Latest Ref Range: 0.6-2.4 mg/L  1.9  IgG (Immunoglobin G), Serum Latest Ref Range: 724-227-5458 mg/dL  996  IgA Latest Ref Range: 90-386 mg/dL  254  IgM, Serum Latest Ref Range: 20-172 mg/dL  130  PSA Latest Ref Range: 0.00-4.00 ng/mL 153.85 (H)          RADIOGRAPHIC STUDIES: I have personally reviewed the radiological images as listed and agreed with the findings in the report. Dg Chest 2 View  12/10/2015  CLINICAL DATA:  Chest pressure for 5 days with progression while at work last night. Increasing shortness of breath. Pain is worse with movement. EXAM: CHEST - 2 VIEW COMPARISON:  One-view chest x-ray 11/05/2006 FINDINGS: The heart size is normal. Emphysematous changes are again noted. Left upper lobe airspace opacity is most consistent with pneumonia. Multilevel degenerative changes are present in the thoracic spine. IMPRESSION: 1. Left upper lobe airspace disease compatible with pneumonia. 2. Emphysema. Electronically Signed   By: San Morelle M.D.   On: 12/10/2015 08:01   Ct Angio Chest Pe W/cm &/or Wo Cm  12/10/2015  CLINICAL DATA:  PT states center chest pressure x5 days and worsening last night while at work with SOB. PT states pain worse with movement EXAM: CT ANGIOGRAPHY CHEST WITH CONTRAST TECHNIQUE: Multidetector CT imaging of the chest was performed using the standard protocol during bolus administration of intravenous contrast. Multiplanar CT image reconstructions and MIPs were obtained to evaluate the vascular anatomy. CONTRAST:  100 mL Isovue 370 IV COMPARISON:  None. FINDINGS: Vascular: Left arm IV contrast injection. Innominate vein and SVC patent. Right atrium and right ventricle are nondilated. Satisfactory opacification of pulmonary arteries noted, and there is no evidence of pulmonary emboli. Patent bilateral pulmonary veins drain  into left atrium. Scattered coronary calcifications. Adequate contrast opacification of the thoracic aorta with no evidence of dissection, aneurysm, or stenosis. There is classic 3-vessel brachiocephalic arch anatomy without proximal stenosis. Mild eccentric nonocclusive plaque in the descending thoracic aorta. Mediastinum/Lymph Nodes: No masses or pathologically enlarged lymph nodes identified. No pericardial effusion. Lungs/Pleura: No pneumothorax. No pulmonary mass, infiltrate, or effusion.Mild posterior dependent subsegmental atelectasis or scarring in the posterior left upper lobe  and bilateral lower lobes. Upper abdomen: No acute findings. Musculoskeletal: Mixed sclerotic/lytic lesions in multiple upper and mid thoracic vertebral bodies, with involvement of posterior elements extensively at T2, T3, and T4. Negative for pathologic fracture. Sclerotic lesions in left fourth, fifth, and sixth ribs. Subcentimeter sclerotic foci in the manubrium. Review of the MIP images confirms the above findings. IMPRESSION: 1. Negative for acute PE or thoracic aortic dissection. 2. Multiple thoracic and rib osseous lesions suggesting osseous metastatic disease. No primary evident. Follow-up recommended. These results will be called to the ordering clinician or representative by the Radiologist Assistant, and communication documented in the PACS or zVision Dashboard. 3. Atherosclerosis, including aortic and coronary artery disease. Please note that although the presence of coronary artery calcium documents the presence of coronary artery disease, the severity of this disease and any potential stenosis cannot be assessed on this non-gated CT examination. Assessment for potential risk factor modification, dietary therapy or pharmacologic therapy may be warranted, if clinically indicated. Electronically Signed   By: Lucrezia Europe M.D.   On: 12/10/2015 10:23   Ct Abdomen Pelvis W Contrast  12/12/2015  CLINICAL DATA:  Numerous  sclerotic lesions throughout the thoracic skeleton of unclear etiology on recent chest CT angiogram. EXAM: CT ABDOMEN AND PELVIS WITH CONTRAST TECHNIQUE: Multidetector CT imaging of the abdomen and pelvis was performed using the standard protocol following bolus administration of intravenous contrast. CONTRAST:  161m ISOVUE-300 IOPAMIDOL (ISOVUE-300) INJECTION 61% COMPARISON:  12/10/2015 chest CT.  No prior CT abdomen/pelvis. FINDINGS: Lower chest: No significant pulmonary nodules or acute consolidative airspace disease. Hepatobiliary: Normal liver with no liver mass. Normal gallbladder with no radiopaque cholelithiasis. No biliary ductal dilatation. Pancreas: Normal, with no mass or duct dilation. Spleen: Normal size. No mass. Adrenals/Urinary Tract: Normal adrenals. There are subjacent simple cysts in the lower right kidney measuring 3.5 cm anteriorly and 6.3 cm posteriorly. There are additional subcentimeter hypodense renal cortical lesions in both kidneys, too small to characterize. No hydronephrosis. Normal bladder. Stomach/Bowel: Grossly normal stomach. Normal caliber small bowel with no small bowel wall thickening. Normal appendix. Normal large bowel with no diverticulosis, large bowel wall thickening or pericolonic fat stranding. Vascular/Lymphatic: Atherosclerotic abdominal aorta with a 3.8 cm infrarenal abdominal aortic aneurysm. Patent portal, splenic, hepatic and renal veins. No pathologically enlarged lymph nodes in the abdomen or pelvis. Reproductive: Prostate is mildly enlarged with prostate volume 34 cc (3.9 x 3.4 x 4.9 cm). Nonspecific coarse internal prostatic calcifications. Seminal vesicles appear symmetric and normal in size. Other: No pneumoperitoneum, ascites or focal fluid collection. Musculoskeletal: Sclerotic osseous lesions of various size throughout the lumbar spine, sacrum, bilateral iliac bones, left ischium and left proximal femoral metaphysis. IMPRESSION: 1. Sclerotic osseous lesions  throughout the lumbar spine, sacrum, bilateral iliac bones and left proximal femur worrisome for sclerotic osseous metastases. Mild prostatomegaly. Recommend correlation with PSA level. 2. No lymphadenopathy or other findings of metastatic disease in the abdomen or pelvis. 3. Infrarenal 3.8 cm abdominal aortic aneurysm. Recommend followup by ultrasound in 2 years. This recommendation follows ACR consensus guidelines: White Paper of the ACR Incidental Findings Committee II on Vascular Findings. J Am Coll Radiol 2013; 10:789-794. Electronically Signed   By: JIlona SorrelM.D.   On: 12/12/2015 09:54    ASSESSMENT & PLAN:  Bony Metastatic Disease Abnormal CT imaging Elevated PSA Urinary hesitancy, frequency Tobacco Abuse Pain Weight loss  Findings are certainly most c/w metastatic prostate carcinoma. I have discussed this with the patient and his family in detail today.  He needs a biopsy and we will refer to IR for bone biopsy. Once pathology available additional recommendations will be made. We discussed how we would proceed if pathology c/w prostate carcinoma. They were given copies of NCCN guidelines for patients.   1. LHRH agonist + antiandrogen to prevent testosterone flare 2. Port a cath placement 3. Taxotere given disease burden and proven benefit in regards to OS  4. Institution of zometa, with prior dental exam 5. Bone scan next week for completeness 6. Calcium + vitamin D 7. We discussed surveillance once therapy is complete  I advised the patient and his family that we will discuss all of the above in much more detail once diagnosis is confirmed.  I will have Matthew [name] our navigator reach out to the patient and his family.   His hydrocodone was refilled.   He will return for follow-up post biopsy.  All questions were answered. The patient knows to call the clinic with any problems, questions or concerns.  This document serves as a record of services personally performed by Ancil Linsey, MD. It was created on her behalf by Kandace Blitz, a trained medical scribe. The creation of this record is based on the scribe's personal observations and the provider's statements to them. This document has been checked and approved by the attending provider.  I have reviewed the above documentation for accuracy and completeness, and I agree with the above.  This note was electronically signed.    Molli Hazard, MD  12/13/2015 4:14 PM

## 2015-12-14 ENCOUNTER — Encounter (HOSPITAL_COMMUNITY): Payer: Self-pay | Admitting: Hematology & Oncology

## 2015-12-14 LAB — IMMUNOFIXATION ELECTROPHORESIS
IGA: 254 mg/dL (ref 90–386)
IGM, SERUM: 132 mg/dL (ref 20–172)
IgG (Immunoglobin G), Serum: 949 mg/dL (ref 700–1600)
Total Protein ELP: 6.7 g/dL (ref 6.0–8.5)

## 2015-12-18 ENCOUNTER — Encounter (HOSPITAL_COMMUNITY): Payer: Self-pay

## 2015-12-18 ENCOUNTER — Encounter (HOSPITAL_COMMUNITY)
Admission: RE | Admit: 2015-12-18 | Discharge: 2015-12-18 | Disposition: A | Discharge status: Home / Self Care | Payer: 59 | Source: Ambulatory Visit | Attending: Hematology & Oncology | Admitting: Hematology & Oncology

## 2015-12-18 DIAGNOSIS — C7951 Secondary malignant neoplasm of bone: Secondary | ICD-10-CM | POA: Diagnosis not present

## 2015-12-18 HISTORY — DX: Malignant (primary) neoplasm, unspecified: C80.1

## 2015-12-18 MED ORDER — TECHNETIUM TC 99M MEDRONATE IV KIT
25.0000 | PACK | Freq: Once | INTRAVENOUS | Status: AC | PRN
  Administered 2015-12-18: 25.6 via INTRAVENOUS

## 2015-12-20 ENCOUNTER — Other Ambulatory Visit: Payer: Self-pay | Admitting: Radiology

## 2015-12-20 ENCOUNTER — Other Ambulatory Visit: Payer: Self-pay | Admitting: General Surgery

## 2015-12-21 ENCOUNTER — Other Ambulatory Visit: Payer: Self-pay | Admitting: Radiology

## 2015-12-24 ENCOUNTER — Ambulatory Visit (HOSPITAL_COMMUNITY)
Admission: RE | Admit: 2015-12-24 | Discharge: 2015-12-24 | Disposition: A | Discharge status: Home / Self Care | Payer: 59 | Source: Ambulatory Visit | Attending: Hematology & Oncology | Admitting: Hematology & Oncology

## 2015-12-24 ENCOUNTER — Encounter (HOSPITAL_COMMUNITY): Payer: Self-pay

## 2015-12-24 DIAGNOSIS — Z86718 Personal history of other venous thrombosis and embolism: Secondary | ICD-10-CM | POA: Diagnosis not present

## 2015-12-24 DIAGNOSIS — R9721 Rising PSA following treatment for malignant neoplasm of prostate: Secondary | ICD-10-CM | POA: Diagnosis present

## 2015-12-24 DIAGNOSIS — Z7982 Long term (current) use of aspirin: Secondary | ICD-10-CM | POA: Diagnosis not present

## 2015-12-24 DIAGNOSIS — F17210 Nicotine dependence, cigarettes, uncomplicated: Secondary | ICD-10-CM | POA: Insufficient documentation

## 2015-12-24 DIAGNOSIS — C7951 Secondary malignant neoplasm of bone: Secondary | ICD-10-CM | POA: Diagnosis not present

## 2015-12-24 DIAGNOSIS — C61 Malignant neoplasm of prostate: Secondary | ICD-10-CM | POA: Diagnosis present

## 2015-12-24 DIAGNOSIS — M1990 Unspecified osteoarthritis, unspecified site: Secondary | ICD-10-CM | POA: Diagnosis not present

## 2015-12-24 DIAGNOSIS — M899 Disorder of bone, unspecified: Secondary | ICD-10-CM | POA: Diagnosis not present

## 2015-12-24 LAB — CBC
HCT: 42.1 % (ref 39.0–52.0)
HEMOGLOBIN: 14.1 g/dL (ref 13.0–17.0)
MCH: 29.3 pg (ref 26.0–34.0)
MCHC: 33.5 g/dL (ref 30.0–36.0)
MCV: 87.3 fL (ref 78.0–100.0)
PLATELETS: 227 10*3/uL (ref 150–400)
RBC: 4.82 MIL/uL (ref 4.22–5.81)
RDW: 13.9 % (ref 11.5–15.5)
WBC: 7.3 10*3/uL (ref 4.0–10.5)

## 2015-12-24 LAB — APTT: APTT: 34 s (ref 24–37)

## 2015-12-24 LAB — PROTIME-INR
INR: 1.04 (ref 0.00–1.49)
PROTHROMBIN TIME: 13.4 s (ref 11.6–15.2)

## 2015-12-24 MED ORDER — SODIUM CHLORIDE 0.9 % IV SOLN
Freq: Once | INTRAVENOUS | Status: AC
  Administered 2015-12-24: 08:00:00 via INTRAVENOUS

## 2015-12-24 MED ORDER — FENTANYL CITRATE (PF) 100 MCG/2ML IJ SOLN
INTRAMUSCULAR | Status: AC
  Filled 2015-12-24: qty 2

## 2015-12-24 MED ORDER — HYDROCODONE-ACETAMINOPHEN 5-325 MG PO TABS
1.0000 | ORAL_TABLET | ORAL | Status: DC | PRN
  Filled 2015-12-24: qty 2

## 2015-12-24 MED ORDER — FENTANYL CITRATE (PF) 100 MCG/2ML IJ SOLN
INTRAMUSCULAR | Status: AC | PRN
  Administered 2015-12-24: 50 ug via INTRAVENOUS

## 2015-12-24 MED ORDER — MIDAZOLAM HCL 2 MG/2ML IJ SOLN
INTRAMUSCULAR | Status: AC
  Filled 2015-12-24: qty 4

## 2015-12-24 MED ORDER — FENTANYL CITRATE (PF) 100 MCG/2ML IJ SOLN
50.0000 ug | Freq: Once | INTRAMUSCULAR | Status: AC
  Administered 2015-12-24: 50 ug via INTRAVENOUS
  Filled 2015-12-24: qty 2

## 2015-12-24 MED ORDER — MIDAZOLAM HCL 2 MG/2ML IJ SOLN
INTRAMUSCULAR | Status: AC | PRN
  Administered 2015-12-24 (×3): 1 mg via INTRAVENOUS

## 2015-12-24 NOTE — Sedation Documentation (Signed)
Patient denies pain and is resting comfortably.  

## 2015-12-24 NOTE — H&P (Signed)
Chief Complaint: prostate cancer with bone mets  Referring Physician:Dr. Ancil Linsey  Supervising Physician: Arne Cleveland  Patient Status:  Out-pt  HPI: Matthew [name] is an 60 y.o. male who is followed by Dr. Whitney Muse for a recent CT of the chest that reveals multiple bony metastatic lesions with no obvious primary.  He was referred to her and a PSA was checked which was elevated.  He has been sent here today for a biopsy of one of the bone lesions in order to hopefully make a diagnosis.  The patient is having significant bone pain today in his chest as he has not been able to take his pain medications today.  Otherwise, he has no new complaints.    Past Medical History:  Past Medical History  Diagnosis Date  . DVT (deep venous thrombosis) ([employer])   . Arthritis   . Cancer [facility])     Prostate    Past Surgical History: History reviewed. No pertinent past surgical history.  Family History: History reviewed. No pertinent family history.  Social History:  reports that he has been smoking Cigarettes.  He has been smoking about 1.50 packs per day. He does not have any smokeless tobacco history on file. He reports that he drinks alcohol. He reports that he does not use illicit drugs.  Allergies:  Allergies  Allergen Reactions  . Meloxicam Other (See Comments)    Caused stomach bleeding.    Medications:   Medication List    ASK your doctor about these medications        aspirin EC 81 MG tablet  Take 81 mg by mouth daily.     BLUE-EMU SUPER STRENGTH EX  Apply 1 application topically 4 (four) times daily as needed (knee/hip pain).     HYDROcodone-acetaminophen 5-325 MG tablet  Commonly known as:  NORCO/VICODIN  Take 1-2 tablets by mouth every 4 (four) hours as needed.     ibuprofen 600 MG tablet  Commonly known as:  ADVIL,MOTRIN  Take 300 mg by mouth 2 (two) times daily as needed for moderate pain.        Please HPI for pertinent positives, otherwise complete 10  system ROS negative.  Mallampati Score: MD Evaluation Airway: WNL Heart: WNL Abdomen: WNL Chest/ Lungs: WNL ASA  Classification: 3 Mallampati/Airway Score: Two  Physical Exam: BP 114/76 mmHg  Pulse 82  Temp(Src) 97.8 F (36.6 C) (Oral)  Resp 18  Wt 238 lb 9.6 oz (108.228 kg)  SpO2 98% Body mass index is 31.49 kg/(m^2). General: pleasant, WD, WN white male who is laying in bed in mild discomfort from his chest pain HEENT: head is normocephalic, atraumatic.  Sclera are noninjected.  PERRL.  Ears and nose without any masses or lesions.  Mouth is pink and moist Heart: regular, rate, and rhythm.  Normal s1,s2. No obvious murmurs, gallops, or rubs noted.  Palpable radial and pedal pulses bilaterally Lungs: CTAB, no wheezes, rhonchi, or rales noted.  Respiratory effort nonlabored Abd: soft, NT, ND, +BS, no masses, hernias, or organomegaly MS: all 4 extremities are symmetrical with no cyanosis, clubbing, or edema. Psych: A&Ox3 with an appropriate affect.   Labs: Results for orders placed or performed during the hospital encounter of 12/24/15 (from the past 48 hour(s))  APTT upon arrival     Status: None   Collection Time: 12/24/15  7:40 AM  Result Value Ref Range   aPTT 34 24 - 37 seconds  CBC upon arrival     Status:  None   Collection Time: 12/24/15  7:40 AM  Result Value Ref Range   WBC 7.3 4.0 - 10.5 K/uL   RBC 4.82 4.22 - 5.81 MIL/uL   Hemoglobin 14.1 13.0 - 17.0 g/dL   HCT 42.1 39.0 - 52.0 %   MCV 87.3 78.0 - 100.0 fL   MCH 29.3 26.0 - 34.0 pg   MCHC 33.5 30.0 - 36.0 g/dL   RDW 13.9 11.5 - 15.5 %   Platelets 227 150 - 400 K/uL  Protime-INR upon arrival     Status: None   Collection Time: 12/24/15  7:40 AM  Result Value Ref Range   Prothrombin Time 13.4 11.6 - 15.2 seconds   INR 1.04 0.00 - 1.49    Imaging: No results found.  Assessment/Plan 1. Multiple bony lytic lesions -the patient has a lytic lesion on his left iliac crest.  We will plan for a bone biopsy  today of this area. -his labs and vitals have been reviewed -we will go ahead and give him 60mcg of fentanyl to help ease his significant pain since he has not been able to take his pain meds yet this morning. -Risks and Benefits discussed with the patient including, but not limited to bleeding, infection, damage to adjacent structures or low yield requiring additional tests. All of the patient's questions were answered, patient is agreeable to proceed. Consent signed and in chart.  Thank you for this interesting consult.  I greatly enjoyed meeting SLEVIN SAURER and look forward to participating in their care.  A copy of this report was sent to the requesting provider on this date.  Electronically Signed: Henreitta Cea 12/24/2015, 8:32 AM   I spent a total of  30 Minutes   in face to face in clinical consultation, greater than 50% of which was counseling/coordinating care for left iliac crest lytic lesion

## 2015-12-24 NOTE — Procedures (Signed)
CT-guided  LEFT iliac bone lesion  core biopsy No complication No blood loss. See complete dictation in [facility]

## 2015-12-24 NOTE — Discharge Instructions (Signed)
Needle Biopsy of the Bone, Care After °Refer to this sheet in the next few weeks. These instructions provide you with information about caring for yourself after your procedure. Your health care provider may also give you more specific instructions. Your treatment has been planned according to current medical practices, but problems sometimes occur. Call your health care provider if you have any problems or questions after your procedure. °WHAT TO EXPECT AFTER THE PROCEDURE  °After your procedure, it is common to have soreness or tenderness at the puncture site. °HOME CARE INSTRUCTIONS °· Take over-the-counter and prescription medicines only as told by your health care provider. °· Bathe and shower as told by your health care provider. °· Follow instructions from your health care provider about: °¨ How to take care of your puncture site. °¨ When and how you should change your bandage (dressing). °¨ When you should remove your dressing. °· Check your puncture site every day for signs of infection. Watch for: °¨ Redness, swelling, or worsening pain. °¨ Fluid, blood, or pus. °· Return to your normal activities as told by your health care provider. °· Keep all follow-up visits as told by your health care provider. This is important. °SEEK MEDICAL CARE IF: °· You have redness, swelling, or worsening pain at the site of your puncture. °· You have fluid, blood, or pus coming from your puncture site. °· You have a fever. °· You have persistent nausea or vomiting. °SEEK IMMEDIATE MEDICAL CARE IF: °· You develop a rash. °· You have difficulty breathing. °  °This information is not intended to replace advice given to you by your health care provider. Make sure you discuss any questions you have with your health care provider. °  °Document Released: 02/07/2005 Document Revised: 04/11/2015 Document Reviewed: 08/28/2014 °[employer] Interactive Patient Education ©2016 [employer] °Moderate Conscious Sedation, Adult, Care  After °Refer to this sheet in the next few weeks. These instructions provide you with information on caring for yourself after your procedure. Your health care provider may also give you more specific instructions. Your treatment has been planned according to current medical practices, but problems sometimes occur. Call your health care provider if you have any problems or questions after your procedure. °WHAT TO EXPECT AFTER THE PROCEDURE  °After your procedure: °· You may feel sleepy, clumsy, and have poor balance for several hours. °· Vomiting may occur if you eat too soon after the procedure. °HOME CARE INSTRUCTIONS °· Do not participate in any activities where you could become injured for at least 24 hours. Do not: °¨ Drive. °¨ Swim. °¨ Ride a bicycle. °¨ Operate heavy machinery. °¨ Cook. °¨ Use power tools. °¨ Climb ladders. °¨ Work from a high place. °· Do not make important decisions or sign legal documents until you are improved. °· If you vomit, drink water, juice, or soup when you can drink without vomiting. Make sure you have little or no nausea before eating solid foods. °· Only take over-the-counter or prescription medicines for pain, discomfort, or fever as directed by your health care provider. °· Make sure you and your family fully understand everything about the medicines given to you, including what side effects may occur. °· You should not drink alcohol, take sleeping pills, or take medicines that cause drowsiness for at least 24 hours. °· If you smoke, do not smoke without supervision. °· If you are feeling better, you may resume normal activities 24 hours after you were sedated. °· Keep all appointments with your health   care provider. °SEEK MEDICAL CARE IF: °· Your skin is pale or bluish in color. °· You continue to feel nauseous or vomit. °· Your pain is getting worse and is not helped by medicine. °· You have bleeding or swelling. °· You are still sleepy or feeling clumsy after 24  hours. °SEEK IMMEDIATE MEDICAL CARE IF: °· You develop a rash. °· You have difficulty breathing. °· You develop any type of allergic problem. °· You have a fever. °MAKE SURE YOU: °· Understand these instructions. °· Will watch your condition. °· Will get help right away if you are not doing well or get worse. °  °This information is not intended to replace advice given to you by your health care provider. Make sure you discuss any questions you have with your health care provider. °  °Document Released: 05/11/2013 Document Revised: 08/11/2014 Document Reviewed: 05/11/2013 °[employer] Interactive Patient Education ©2016 [employer] ° °

## 2015-12-26 ENCOUNTER — Other Ambulatory Visit (HOSPITAL_COMMUNITY): Payer: Self-pay | Admitting: *Deleted

## 2015-12-26 ENCOUNTER — Ambulatory Visit (HOSPITAL_COMMUNITY): Payer: PRIVATE HEALTH INSURANCE | Admitting: Hematology & Oncology

## 2015-12-26 ENCOUNTER — Other Ambulatory Visit (HOSPITAL_COMMUNITY): Payer: Self-pay | Admitting: Hematology & Oncology

## 2015-12-26 DIAGNOSIS — C7951 Secondary malignant neoplasm of bone: Secondary | ICD-10-CM | POA: Insufficient documentation

## 2015-12-26 DIAGNOSIS — C61 Malignant neoplasm of prostate: Secondary | ICD-10-CM

## 2015-12-26 HISTORY — DX: Malignant neoplasm of prostate: C61

## 2015-12-26 HISTORY — DX: Secondary malignant neoplasm of bone: C79.51

## 2015-12-27 ENCOUNTER — Other Ambulatory Visit (HOSPITAL_COMMUNITY): Payer: Self-pay | Admitting: Hematology & Oncology

## 2015-12-27 MED ORDER — ONDANSETRON HCL 8 MG PO TABS: 8.0000 mg | ORAL_TABLET | Freq: Three times a day (TID) | ORAL | Status: DC | PRN

## 2015-12-27 MED ORDER — LIDOCAINE-PRILOCAINE 2.5-2.5 % EX CREA: TOPICAL_CREAM | CUTANEOUS | Status: DC

## 2015-12-27 MED ORDER — DEXAMETHASONE 4 MG PO TABS: ORAL_TABLET | ORAL | Status: DC

## 2015-12-27 MED ORDER — PROCHLORPERAZINE MALEATE 10 MG PO TABS: 10.0000 mg | ORAL_TABLET | Freq: Four times a day (QID) | ORAL | Status: DC | PRN

## 2015-12-27 MED ORDER — CALCIUM CARB-CHOLECALCIFEROL 500-600 MG-UNIT PO TABS: 2.0000 | ORAL_TABLET | Freq: Every day | ORAL | Status: DC

## 2015-12-27 NOTE — Patient Instructions (Signed)
Matthew [name]   CHEMOTHERAPY INSTRUCTIONS  Premeds:  Dexamethasone - steroid given IV- given to reduce the risk of you developing fluid retention from the Taxotere chemotherapy. It can also decrease nausea/vomiting. Steroids can make you feel hot/flushed in the face/neck/chest and/or turn red/pink those areas. This will pass as the medication wears off. It can also make you feel jittery, anxious, have trouble sleeping the night after receiving it.   Chemo:   Taxotere - bone marrow suppression (lowers white blood cells (fight infection), lowers red blood cells (make up your blood), lowers platelets (help blood to clot). This chemo can cause fluid retention. You will be responsible for taking a steroid called Dexamethasone at home the day before, day of and day after Taxotere. This steroid will keep you from having fluid retention. Take it whether you think you need it or not. Can cause hair loss, skin/nail changes (darkening of the nail beds, pain where the nail bed meets the skin, loosening of the nail beds, dry skin, palms of hands and soles of feet may darken or get sensitive, nausea/vomiting, paresthesia (numbness or tingling) in extremities - we need to know if this develops, mucositis (inflammation of any mucosal membrane - the mouth, throat), mouth sores, neurotoxicity (loss of memory, headaches, trouble sleeping, etc.), can also cause excessive tear production. Please let us know if any side effect develops. (takes 1 hour to infuse)   Neulasta - this medication is not chemo but being given because you have had chemo. It is usually given 27 hours after the completion of chemotherapy. This medication works by boosting your bone marrow's supply of white blood cells. White blood cells are what protect our bodies against infection. The medication is given in the form of a subcutaneous injection. It is given in the fatty tissue of your abdomen. It is a short needle. The  major side effect of this medication is bone or muscle pain. The drug of choice to relieve or lessen the pain is Aleve or Ibuprofen. If a physician has ever told you not to take Aleve or Ibuprofen - then don't take it. You should then take Tylenol/acetaminophen. Take either medication as the bottle directs you to.  The level of pain you experience as a result of this injection can range from none, to mild or moderate, or severe. Please let us know if you develop moderate or severe bone pain.      [name] - used to treat advanced prostate cancer. We will inject this in the fatty tissue of the abdomen. After the first injection, you will only receive 1 injection every 28 days.  Common Side Effects: hot flashes, injection site pain/redness/swelling, weight gain, increase in some liver enzymes, decreased sex drive and erectile function problems  XGEVA - this is being given to hopefully protect/prevent your bones from fracturing/breaking. This medication is given in the abdominal tissue every 28 days. We use a short needle. The needle goes into the fatty tissue only.  We will monitor your calcium levels prior to you receiving this medication because it can cause low blood calcium levels.  You need to take Calcium 1200mg  and Vit D 1000mg  every day. You must tell your dentist/dental hygienist that you are on this medication!!!!! You must report jaw pain or tooth pain to [place]!!!!   POTENTIAL SIDE EFFECTS OF TREATMENT: Increased Susceptibility to Infection, Vomiting, Constipation, Hair Thinning, Changes in Character of Skin and Nails (brittleness, dryness,etc.), Pigment Changes (darkening of nail beds,  palms of hands, soles of feet, etc.), Bone Marrow Suppression, Abdominal Cramping, Complete Hair Loss, Nausea, Diarrhea, Sun Sensitivity and Mouth Sores   EDUCATIONAL MATERIALS GIVEN AND REVIEWED: Specific Instructions Sheets: Taxotere, Dexamethasone, Firmagon, XGEVA, Zofran, Compazine, EMLA   SELF CARE  ACTIVITIES WHILE ON CHEMOTHERAPY: Increase your fluid intake 48 hours prior to treatment and drink at least 2 quarts per day after treatment., No alcohol intake., No aspirin or other medications unless approved by your oncologist., Eat foods that are light and easy to digest., Eat foods at cold or room temperature., No fried, fatty, or spicy foods immediately before or after treatment., Have teeth cleaned professionally before starting treatment. Keep dentures and partial plates clean., Use soft toothbrush and do not use mouthwashes that contain alcohol. Biotene is a good mouthwash that is available at most pharmacies or may be ordered by calling [phone]., Use warm salt water gargles (1 teaspoon salt per 1 quart warm water) before and after meals and at bedtime. Or you may rinse with 2 tablespoons of three -percent hydrogen peroxide mixed in eight ounces of water., Always use sunscreen with SPF (Sun Protection Factor) of 30 or higher., Use your nausea medication as directed to prevent nausea., Use your stool softener or laxative as directed to prevent constipation. and Use your anti-diarrheal medication as directed to stop diarrhea.   MEDICATIONS: You have been given prescriptions for the following medications:  Dexamethasone 4mg  tablet. The day before, day of, and day after chemo take 2 tabs in the am and 2 tabs in the pm. Take with food.  Zofran/Ondansetron 8mg  tablet. Take 1 tablet every 8 hours as needed for nausea/vomiting. (#1 nausea med to take, this can constipate)  Compazine/Prochlorperazine 10mg  tablet. Take 1 tablet every 6 hours as needed for nausea/vomiting. (#2 nausea med to take, this can make you sleepy)  Calcium 1200mg /Vit D 1000 iu every day!!!  EMLA cream. Apply a quarter size amount to port site 1 hour prior to chemo. Do not rub in. Cover with plastic wrap.   Over-the-Counter Meds:  Miralax 1 capful in 8 oz of fluid daily. May increase to two times a day if needed. This  is a stool softener. If this doesn't work proceed you can add:  Senokot S  - start with 1 tablet two times a day and increase to 4 tablets two times a day if needed. (total of 8 tablets in a 24 hour period). This is a stimulant laxative.   Call us if this does not help your bowels move.   Imodium 2mg  capsule. Take 2 capsules after the 1st loose stool and then 1 capsule every 2 hours until you go a total of 12 hours without having a loose stool. Call the [facility] if loose stools continue. If diarrhea occurs @ bedtime, take 2 capsules @ bedtime. Then take 2 capsules every 4 hours until morning. Call [facility].    SYMPTOMS TO REPORT AS SOON AS POSSIBLE AFTER TREATMENT:  FEVER GREATER THAN 100.5 F  CHILLS WITH OR WITHOUT FEVER  NAUSEA AND VOMITING THAT IS NOT CONTROLLED WITH YOUR NAUSEA MEDICATION  UNUSUAL SHORTNESS OF BREATH  UNUSUAL BRUISING OR BLEEDING  TENDERNESS IN MOUTH AND THROAT WITH OR WITHOUT PRESENCE OF ULCERS  URINARY PROBLEMS  BOWEL PROBLEMS  UNUSUAL RASH    Wear comfortable clothing and clothing appropriate for easy access to any Portacath or PICC line. Let us know if there is anything that we can do to make your therapy better!  I have been informed and understand all of the instructions given to me and have received a copy. I have been instructed to call the clinic [phone] or my family physician as soon as possible for continued medical care, if indicated. I do not have any more questions at this time but understand that I may call the [facility] or the Patient Navigator at [phone] during office hours should I have questions or need assistance in obtaining follow-up care.            Docetaxel injection What is this medicine? DOCETAXEL (doe se TAX el) is a chemotherapy drug. It targets fast dividing cells, like cancer cells, and causes these cells to die. This medicine is used to treat many types of cancers like breast  cancer, certain stomach cancers, head and neck cancer, lung cancer, and prostate cancer. This medicine may be used for other purposes; ask your health care provider or pharmacist if you have questions. What should I tell my health care provider before I take this medicine? They need to know if you have any of these conditions: -infection (especially a virus infection such as chickenpox, cold sores, or herpes) -liver disease -low blood counts, like low white cell, platelet, or red cell counts -an unusual or allergic reaction to docetaxel, polysorbate 80, other chemotherapy agents, other medicines, foods, dyes, or preservatives -pregnant or trying to get pregnant -breast-feeding How should I use this medicine? This drug is given as an infusion into a vein. It is administered in a hospital or clinic by a specially trained health care professional. Talk to your pediatrician regarding the use of this medicine in children. Special care may be needed. Overdosage: If you think you have taken too much of this medicine contact a poison control center or emergency room at once. NOTE: This medicine is only for you. Do not share this medicine with others. What if I miss a dose? It is important not to miss your dose. Call your doctor or health care professional if you are unable to keep an appointment. What may interact with this medicine? -cyclosporine -erythromycin -ketoconazole -medicines to increase blood counts like filgrastim, pegfilgrastim, sargramostim -vaccines Talk to your doctor or health care professional before taking any of these medicines: -acetaminophen -aspirin -ibuprofen -ketoprofen -naproxen This list may not describe all possible interactions. Give your health care provider a list of all the medicines, herbs, non-prescription drugs, or dietary supplements you use. Also tell them if you smoke, drink alcohol, or use illegal drugs. Some items may interact with your medicine. What  should I watch for while using this medicine? Your condition will be monitored carefully while you are receiving this medicine. You will need important blood work done while you are taking this medicine. This drug may make you feel generally unwell. This is not uncommon, as chemotherapy can affect healthy cells as well as cancer cells. Report any side effects. Continue your course of treatment even though you feel ill unless your doctor tells you to stop. In some cases, you may be given additional medicines to help with side effects. Follow all directions for their use. Call your doctor or health care professional for advice if you get a fever, chills or sore throat, or other symptoms of a cold or flu. Do not treat yourself. This drug decreases your body's ability to fight infections. Try to avoid being around people who are sick. This medicine may increase your risk to bruise or bleed. Call your doctor or  health care professional if you notice any unusual bleeding. This medicine may contain alcohol in the product. You may get drowsy or dizzy. Do not drive, use machinery, or do anything that needs mental alertness until you know how this medicine affects you. Do not stand or sit up quickly, especially if you are an older patient. This reduces the risk of dizzy or fainting spells. Avoid alcoholic drinks. Do not become pregnant while taking this medicine. Women should inform their doctor if they wish to become pregnant or think they might be pregnant. There is a potential for serious side effects to an unborn child. Talk to your health care professional or pharmacist for more information. Do not breast-feed an infant while taking this medicine. What side effects may I notice from receiving this medicine? Side effects that you should report to your doctor or health care professional as soon as possible: -allergic reactions like skin rash, itching or hives, swelling of the face, lips, or tongue -low blood  counts - This drug may decrease the number of white blood cells, red blood cells and platelets. You may be at increased risk for infections and bleeding. -signs of infection - fever or chills, cough, sore throat, pain or difficulty passing urine -signs of decreased platelets or bleeding - bruising, pinpoint red spots on the skin, black, tarry stools, nosebleeds -signs of decreased red blood cells - unusually weak or tired, fainting spells, lightheadedness -breathing problems -fast or irregular heartbeat -low blood pressure -mouth sores -nausea and vomiting -pain, swelling, redness or irritation at the injection site -pain, tingling, numbness in the hands or feet -swelling of the ankle, feet, hands -weight gain Side effects that usually do not require medical attention (report to your prescriber or health care professional if they continue or are bothersome): -bone pain -complete hair loss including hair on your head, underarms, pubic hair, eyebrows, and eyelashes -diarrhea -excessive tearing -changes in the color of fingernails -loosening of the fingernails -nausea -muscle pain -red flush to skin -sweating -weak or tired This list may not describe all possible side effects. Call your doctor for medical advice about side effects. You may report side effects to FDA at 1-800-FDA-1088. Where should I keep my medicine? This drug is given in a hospital or clinic and will not be stored at home. NOTE: This sheet is a summary. It may not cover all possible information. If you have questions about this medicine, talk to your doctor, pharmacist, or health care provider.    2016, Elsevier/Gold Standard. (2014-08-07 16:04:57) Dexamethasone injection What is this medicine? DEXAMETHASONE (dex a METH a sone) is a corticosteroid. It is used to treat inflammation of the skin, joints, lungs, and other organs. Common conditions treated include asthma, allergies, and arthritis. It is also used for other  conditions, like blood disorders and diseases of the adrenal glands. This medicine may be used for other purposes; ask your health care provider or pharmacist if you have questions. What should I tell my health care provider before I take this medicine? They need to know if you have any of these conditions: -blood clotting problems -Cushing's syndrome -diabetes -glaucoma -heart problems or disease -high blood pressure -infection like herpes, measles, tuberculosis, or chickenpox -kidney disease -liver disease -mental problems -myasthenia gravis -osteoporosis -previous heart attack -seizures -stomach, ulcer or intestine disease including colitis and diverticulitis -thyroid problem -an unusual or allergic reaction to dexamethasone, corticosteroids, other medicines, lactose, foods, dyes, or preservatives -pregnant or trying to get pregnant -breast-feeding How should I  use this medicine? This medicine is for injection into a muscle, joint, lesion, soft tissue, or vein. It is given by a health care professional in a hospital or clinic setting. Talk to your pediatrician regarding the use of this medicine in children. Special care may be needed. Overdosage: If you think you have taken too much of this medicine contact a poison control center or emergency room at once. NOTE: This medicine is only for you. Do not share this medicine with others. What if I miss a dose? This may not apply. If you are having a series of injections over a prolonged period, try not to miss an appointment. Call your doctor or health care professional to reschedule if you are unable to keep an appointment. What may interact with this medicine? Do not take this medicine with any of the following medications: -mifepristone, RU-486 -vaccines This medicine may also interact with the following medications: -amphotericin B -antibiotics like clarithromycin, erythromycin, and troleandomycin -aspirin and aspirin-like  drugs -barbiturates like phenobarbital -carbamazepine -cholestyramine -cholinesterase inhibitors like donepezil, galantamine, rivastigmine, and tacrine -cyclosporine -digoxin -diuretics -ephedrine -male hormones, like estrogens or progestins and birth control pills -indinavir -isoniazid -ketoconazole -medicines for diabetes -medicines that improve muscle tone or strength for conditions like myasthenia gravis -NSAIDs, medicines for pain and inflammation, like ibuprofen or naproxen -phenytoin -rifampin -thalidomide -warfarin This list may not describe all possible interactions. Give your health care provider a list of all the medicines, herbs, non-prescription drugs, or dietary supplements you use. Also tell them if you smoke, drink alcohol, or use illegal drugs. Some items may interact with your medicine. What should I watch for while using this medicine? Your condition will be monitored carefully while you are receiving this medicine. If you are taking this medicine for a long time, carry an identification card with your name and address, the type and dose of your medicine, and your doctor's name and address. This medicine may increase your risk of getting an infection. Stay away from people who are sick. Tell your doctor or health care professional if you are around anyone with measles or chickenpox. Talk to your health care provider before you get any vaccines that you take this medicine. If you are going to have surgery, tell your doctor or health care professional that you have taken this medicine within the last twelve months. Ask your doctor or health care professional about your diet. You may need to lower the amount of salt you eat. The medicine can increase your blood sugar. If you are a diabetic check with your doctor if you need help adjusting the dose of your diabetic medicine. What side effects may I notice from receiving this medicine? Side effects that you should report  to your doctor or health care professional as soon as possible: -allergic reactions like skin rash, itching or hives, swelling of the face, lips, or tongue -black or tarry stools -change in the amount of urine -changes in vision -confusion, excitement, restlessness, a false sense of well-being -fever, sore throat, sneezing, cough, or other signs of infection, wounds that will not heal -hallucinations -increased thirst -mental depression, mood swings, mistaken feelings of self importance or of being mistreated -pain in hips, back, ribs, arms, shoulders, or legs -pain, redness, or irritation at the injection site -redness, blistering, peeling or loosening of the skin, including inside the mouth -rounding out of face -swelling of feet or lower legs -unusual bleeding or bruising -unusual tired or weak -wounds that do not heal Side effects  that usually do not require medical attention (report to your doctor or health care professional if they continue or are bothersome): -diarrhea or constipation -change in taste -headache -nausea, vomiting -skin problems, acne, thin and shiny skin -touble sleeping -unusual growth of hair on the face or body -weight gain This list may not describe all possible side effects. Call your doctor for medical advice about side effects. You may report side effects to FDA at 1-800-FDA-1088. Where should I keep my medicine? This drug is given in a hospital or clinic and will not be stored at home. NOTE: This sheet is a summary. It may not cover all possible information. If you have questions about this medicine, talk to your doctor, pharmacist, or health care provider.    2016, Elsevier/Gold Standard. (2007-11-11 14:04:12) Pegfilgrastim injection What is this medicine? PEGFILGRASTIM (PEG fil gra stim) is a long-acting granulocyte colony-stimulating factor that stimulates the growth of neutrophils, a type of white blood cell important in the body's fight against  infection. It is used to reduce the incidence of fever and infection in patients with certain types of cancer who are receiving chemotherapy that affects the bone marrow, and to increase survival after being exposed to high doses of radiation. This medicine may be used for other purposes; ask your health care provider or pharmacist if you have questions. What should I tell my health care provider before I take this medicine? They need to know if you have any of these conditions: -kidney disease -latex allergy -ongoing radiation therapy -sickle cell disease -skin reactions to acrylic adhesives (On-Body Injector only) -an unusual or allergic reaction to pegfilgrastim, filgrastim, other medicines, foods, dyes, or preservatives -pregnant or trying to get pregnant -breast-feeding How should I use this medicine? This medicine is for injection under the skin. If you get this medicine at home, you will be taught how to prepare and give the pre-filled syringe or how to use the On-body Injector. Refer to the patient Instructions for Use for detailed instructions. Use exactly as directed. Take your medicine at regular intervals. Do not take your medicine more often than directed. It is important that you put your used needles and syringes in a special sharps container. Do not put them in a trash can. If you do not have a sharps container, call your pharmacist or healthcare provider to get one. Talk to your pediatrician regarding the use of this medicine in children. While this drug may be prescribed for selected conditions, precautions do apply. Overdosage: If you think you have taken too much of this medicine contact a poison control center or emergency room at once. NOTE: This medicine is only for you. Do not share this medicine with others. What if I miss a dose? It is important not to miss your dose. Call your doctor or health care professional if you miss your dose. If you miss a dose due to an On-body  Injector failure or leakage, a new dose should be administered as soon as possible using a single prefilled syringe for manual use. What may interact with this medicine? Interactions have not been studied. Give your health care provider a list of all the medicines, herbs, non-prescription drugs, or dietary supplements you use. Also tell them if you smoke, drink alcohol, or use illegal drugs. Some items may interact with your medicine. This list may not describe all possible interactions. Give your health care provider a list of all the medicines, herbs, non-prescription drugs, or dietary supplements you use. Also tell  them if you smoke, drink alcohol, or use illegal drugs. Some items may interact with your medicine. What should I watch for while using this medicine? You may need blood work done while you are taking this medicine. If you are going to need a MRI, CT scan, or other procedure, tell your doctor that you are using this medicine (On-Body Injector only). What side effects may I notice from receiving this medicine? Side effects that you should report to your doctor or health care professional as soon as possible: -allergic reactions like skin rash, itching or hives, swelling of the face, lips, or tongue -dizziness -fever -pain, redness, or irritation at site where injected -pinpoint red spots on the skin -red or dark-brown urine -shortness of breath or breathing problems -stomach or side pain, or pain at the shoulder -swelling -tiredness -trouble passing urine or change in the amount of urine Side effects that usually do not require medical attention (report to your doctor or health care professional if they continue or are bothersome): -bone pain -muscle pain This list may not describe all possible side effects. Call your doctor for medical advice about side effects. You may report side effects to FDA at 1-800-FDA-1088. Where should I keep my medicine? Keep out of the reach of  children. Store pre-filled syringes in a refrigerator between 2 and 8 degrees C (36 and 46 degrees F). Do not freeze. Keep in carton to protect from light. Throw away this medicine if it is left out of the refrigerator for more than 48 hours. Throw away any unused medicine after the expiration date. NOTE: This sheet is a summary. It may not cover all possible information. If you have questions about this medicine, talk to your doctor, pharmacist, or health care provider.    2016, [employer] Standard. (2014-08-10 14:30:14) Degarelix injection What is this medicine? DEGARELIX (deg a REL ix) is used to treat men with advanced prostate cancer. This medicine may be used for other purposes; ask your health care provider or pharmacist if you have questions. What should I tell my health care provider before I take this medicine? They need to know if you have any of these conditions: -heart disease -kidney disease -liver disease -low levels of potassium or magnesium in the blood -osteoporosis -an unusual or allergic reaction to degarelix, mannitol, other medicines, foods, dyes, or preservatives -pregnant or trying to get pregnant -breast-feeding How should I use this medicine? This medicine is for injection under the skin. It is usually given by a health care professional in a hospital or clinic setting. If you get this medicine at home, you will be taught how to prepare and give this medicine. Use exactly as directed. Take your medicine at regular intervals. Do not take it more often than directed. It is important that you put your used needles and syringes in a special sharps container. Do not put them in a trash can. If you do not have a sharps container, call your pharmacist or healthcare provider to get one. Talk to your pediatrician regarding the use of this medicine in children. Special care may be needed. Overdosage: If you think you have taken too much of this medicine contact a poison  control center or emergency room at once. NOTE: This medicine is only for you. Do not share this medicine with others. What if I miss a dose? Try not to miss a dose. If you do miss a dose, call your doctor or health care professional for advice. What may interact  with this medicine? Do not take this medicine with any of the following medications: -amiodarone -bretylium -disopyramide -dofetilide -droperidol -ibutilide -procainamide -quinidine -sotalol This list may not describe all possible interactions. Give your health care provider a list of all the medicines, herbs, non-prescription drugs, or dietary supplements you use. Also tell them if you smoke, drink alcohol, or use illegal drugs. Some items may interact with your medicine. What should I watch for while using this medicine? Visit your doctor or health care professional for regular checks on your progress and discuss any issues before you start taking this medicine. Do not rub or scratch injection site. There may be a lump at the injection site, or it may be red or sore for a few days after your dose. Your doctor or health care professional will need to monitor your hormone levels in your blood to check your response to treatment. Try to keep any appointments for testing. What side effects may I notice from receiving this medicine? Side effects that you should report to your doctor or health care professional as soon as possible: -allergic reactions like skin rash, itching or hives, swelling of the face, lips, or tongue -fever or chills -irregular heartbeat -nausea and vomiting along with severe abdominal pain -pain or difficulty passing urine -pelvic pain or bloating Side effects that usually do not require medical attention (Report these to your doctor or health care professional if they continue or are bothersome.): -change in sex drive or performance -constipation -headache -high blood pressure - hot flashes (flushing of  skin, increased sweating) - itching, redness or mild pain at site where injected -joint pain -trouble sleeping -unusually weak or tired -weight gain This list may not describe all possible side effects. Call your doctor for medical advice about side effects. You may report side effects to FDA at 1-800-FDA-1088. Where should I keep my medicine? Keep out of the reach of children. This drug is usually given in a hospital or clinic and will not be stored at home. In rare cases, this medicine may be given at home. If you are using this medicine at home, you will be instructed on how to store this medicine. Throw away any unused medicine after the expiration date on the label. NOTE: This sheet is a summary. It may not cover all possible information. If you have questions about this medicine, talk to your doctor, pharmacist, or health care provider.    2016, [employer]. (2014-07-26 17:38:48) Denosumab injection What is this medicine? DENOSUMAB (den oh sue mab) slows bone breakdown. Prolia is used to treat osteoporosis in women after menopause and in men. [name] is used to prevent bone fractures and other bone problems caused by cancer bone metastases. [name] is also used to treat giant cell tumor of the bone. This medicine may be used for other purposes; ask your health care provider or pharmacist if you have questions. What should I tell my health care provider before I take this medicine? They need to know if you have any of these conditions: -dental disease -eczema -infection or history of infections -kidney disease or on dialysis -low blood calcium or vitamin D -malabsorption syndrome -scheduled to have surgery or tooth extraction -taking medicine that contains denosumab -thyroid or parathyroid disease -an unusual reaction to denosumab, other medicines, foods, dyes, or preservatives -pregnant or trying to get pregnant -breast-feeding How should I use this medicine? This  medicine is for injection under the skin. It is given by a health care professional in a  hospital or clinic setting. If you are getting Prolia, a special MedGuide will be given to you by the pharmacist with each prescription and refill. Be sure to read this information carefully each time. For Prolia, talk to your pediatrician regarding the use of this medicine in children. Special care may be needed. For [name], talk to your pediatrician regarding the use of this medicine in children. While this drug may be prescribed for children as young as 13 years for selected conditions, precautions do apply. Overdosage: If you think you have taken too much of this medicine contact a poison control center or emergency room at once. NOTE: This medicine is only for you. Do not share this medicine with others. What if I miss a dose? It is important not to miss your dose. Call your doctor or health care professional if you are unable to keep an appointment. What may interact with this medicine? Do not take this medicine with any of the following medications: -other medicines containing denosumab This medicine may also interact with the following medications: -medicines that suppress the immune system -medicines that treat cancer -steroid medicines like prednisone or cortisone This list may not describe all possible interactions. Give your health care provider a list of all the medicines, herbs, non-prescription drugs, or dietary supplements you use. Also tell them if you smoke, drink alcohol, or use illegal drugs. Some items may interact with your medicine. What should I watch for while using this medicine? Visit your doctor or health care professional for regular checks on your progress. Your doctor or health care professional may order blood tests and other tests to see how you are doing. Call your doctor or health care professional if you get a cold or other infection while receiving this medicine. Do not treat  yourself. This medicine may decrease your body's ability to fight infection. You should make sure you get enough calcium and vitamin D while you are taking this medicine, unless your doctor tells you not to. Discuss the foods you eat and the vitamins you take with your health care professional. See your dentist regularly. Brush and floss your teeth as directed. Before you have any dental work done, tell your dentist you are receiving this medicine. Do not become pregnant while taking this medicine or for 5 months after stopping it. Women should inform their doctor if they wish to become pregnant or think they might be pregnant. There is a potential for serious side effects to an unborn child. Talk to your health care professional or pharmacist for more information. What side effects may I notice from receiving this medicine? Side effects that you should report to your doctor or health care professional as soon as possible: -allergic reactions like skin rash, itching or hives, swelling of the face, lips, or tongue -breathing problems -chest pain -fast, irregular heartbeat -feeling faint or lightheaded, falls -fever, chills, or any other sign of infection -muscle spasms, tightening, or twitches -numbness or tingling -skin blisters or bumps, or is dry, peels, or red -slow healing or unexplained pain in the mouth or jaw -unusual bleeding or bruising Side effects that usually do not require medical attention (Report these to your doctor or health care professional if they continue or are bothersome.): -muscle pain -stomach upset, gas This list may not describe all possible side effects. Call your doctor for medical advice about side effects. You may report side effects to FDA at 1-800-FDA-1088. Where should I keep my medicine? This medicine is only given in  a clinic, doctor's office, or other health care setting and will not be stored at home. NOTE: This sheet is a summary. It may not cover all  possible information. If you have questions about this medicine, talk to your doctor, pharmacist, or health care provider.    2016, [employer]. (2012-01-19 12:37:47) Ondansetron tablets What is this medicine? ONDANSETRON (on DAN se tron) is used to treat nausea and vomiting caused by chemotherapy. It is also used to prevent or treat nausea and vomiting after surgery. This medicine may be used for other purposes; ask your health care provider or pharmacist if you have questions. What should I tell my health care provider before I take this medicine? They need to know if you have any of these conditions: -heart disease -history of irregular heartbeat -liver disease -low levels of magnesium or potassium in the blood -an unusual or allergic reaction to ondansetron, granisetron, other medicines, foods, dyes, or preservatives -pregnant or trying to get pregnant -breast-feeding How should I use this medicine? Take this medicine by mouth with a glass of water. Follow the directions on your prescription label. Take your doses at regular intervals. Do not take your medicine more often than directed. Talk to your pediatrician regarding the use of this medicine in children. Special care may be needed. Overdosage: If you think you have taken too much of this medicine contact a poison control center or emergency room at once. NOTE: This medicine is only for you. Do not share this medicine with others. What if I miss a dose? If you miss a dose, take it as soon as you can. If it is almost time for your next dose, take only that dose. Do not take double or extra doses. What may interact with this medicine? Do not take this medicine with any of the following medications: -apomorphine -certain medicines for fungal infections like fluconazole, itraconazole, ketoconazole, posaconazole, voriconazole -cisapride -dofetilide -dronedarone -pimozide -thioridazine -ziprasidone This medicine may  also interact with the following medications: -carbamazepine -certain medicines for depression, anxiety, or psychotic disturbances -fentanyl -linezolid -MAOIs like Carbex, Eldepryl, Marplan, Nardil, and Parnate -methylene blue (injected into a vein) -other medicines that prolong the QT interval (cause an abnormal heart rhythm) -phenytoin -rifampicin -tramadol This list may not describe all possible interactions. Give your health care provider a list of all the medicines, herbs, non-prescription drugs, or dietary supplements you use. Also tell them if you smoke, drink alcohol, or use illegal drugs. Some items may interact with your medicine. What should I watch for while using this medicine? Check with your doctor or health care professional right away if you have any sign of an allergic reaction. What side effects may I notice from receiving this medicine? Side effects that you should report to your doctor or health care professional as soon as possible: -allergic reactions like skin rash, itching or hives, swelling of the face, lips or tongue -breathing problems -confusion -dizziness -fast or irregular heartbeat -feeling faint or lightheaded, falls -fever and chills -loss of balance or coordination -seizures -sweating -swelling of the hands or feet -tightness in the chest -tremors -unusually weak or tired Side effects that usually do not require medical attention (report to your doctor or health care professional if they continue or are bothersome): -constipation or diarrhea -headache This list may not describe all possible side effects. Call your doctor for medical advice about side effects. You may report side effects to FDA at 1-800-FDA-1088. Where should I keep my medicine? Keep out of  the reach of children. Store between 2 and 30 degrees C (36 and 86 degrees F). Throw away any unused medicine after the expiration date. NOTE: This sheet is a summary. It may not cover all  possible information. If you have questions about this medicine, talk to your doctor, pharmacist, or health care provider.    2016, [employer]. (2013-04-27 16:27:45) Prochlorperazine tablets What is this medicine? PROCHLORPERAZINE (proe klor PER a zeen) helps to control severe nausea and vomiting. This medicine is also used to treat schizophrenia. It can also help patients who experience anxiety that is not due to psychological illness. This medicine may be used for other purposes; ask your health care provider or pharmacist if you have questions. What should I tell my health care provider before I take this medicine? They need to know if you have any of these conditions: -blood disorders or disease -dementia -liver disease or jaundice -Parkinson's disease -uncontrollable movement disorder -an unusual or allergic reaction to prochlorperazine, other medicines, foods, dyes, or preservatives -pregnant or trying to get pregnant -breast-feeding How should I use this medicine? Take this medicine by mouth with a glass of water. Follow the directions on the prescription label. Take your doses at regular intervals. Do not take your medicine more often than directed. Do not stop taking this medicine suddenly. This can cause nausea, vomiting, and dizziness. Ask your doctor or health care professional for advice. Talk to your pediatrician regarding the use of this medicine in children. Special care may be needed. While this drug may be prescribed for children as young as 2 years for selected conditions, precautions do apply. Overdosage: If you think you have taken too much of this medicine contact a poison control center or emergency room at once. NOTE: This medicine is only for you. Do not share this medicine with others. What if I miss a dose? If you miss a dose, take it as soon as you can. If it is almost time for your next dose, take only that dose. Do not take double or extra  doses. What may interact with this medicine? Do not take this medicine with any of the following medications: -amoxapine -antidepressants like citalopram, escitalopram, fluoxetine, paroxetine, and sertraline -deferoxamine -dofetilide -maprotiline -tricyclic antidepressants like amitriptyline, clomipramine, imipramine, nortiptyline and others This medicine may also interact with the following medications: -lithium -medicines for pain -phenytoin -propranolol -warfarin This list may not describe all possible interactions. Give your health care provider a list of all the medicines, herbs, non-prescription drugs, or dietary supplements you use. Also tell them if you smoke, drink alcohol, or use illegal drugs. Some items may interact with your medicine. What should I watch for while using this medicine? Visit your doctor or health care professional for regular checks on your progress. You may get drowsy or dizzy. Do not drive, use machinery, or do anything that needs mental alertness until you know how this medicine affects you. Do not stand or sit up quickly, especially if you are an older patient. This reduces the risk of dizzy or fainting spells. Alcohol may interfere with the effect of this medicine. Avoid alcoholic drinks. This medicine can reduce the response of your body to heat or cold. Dress warm in cold weather and stay hydrated in hot weather. If possible, avoid extreme temperatures like saunas, hot tubs, very hot or cold showers, or activities that can cause dehydration such as vigorous exercise. This medicine can make you more sensitive to the sun. Keep out of the sun.  If you cannot avoid being in the sun, wear protective clothing and use sunscreen. Do not use sun lamps or tanning beds/booths. Your mouth may get dry. Chewing sugarless gum or sucking hard candy, and drinking plenty of water may help. Contact your doctor if the problem does not go away or is severe. What side effects may I  notice from receiving this medicine? Side effects that you should report to your doctor or health care professional as soon as possible: -blurred vision -breast enlargement in men or women -breast milk in women who are not breast-feeding -chest pain, fast or irregular heartbeat -confusion, restlessness -dark yellow or brown urine -difficulty breathing or swallowing -dizziness or fainting spells -drooling, shaking, movement difficulty (shuffling walk) or rigidity -fever, chills, sore throat -involuntary or uncontrollable movements of the eyes, mouth, head, arms, and legs -seizures -stomach area pain -unusually weak or tired -unusual bleeding or bruising -yellowing of skin or eyes Side effects that usually do not require medical attention (report to your doctor or health care professional if they continue or are bothersome): -difficulty passing urine -difficulty sleeping -headache -sexual dysfunction -skin rash, or itching This list may not describe all possible side effects. Call your doctor for medical advice about side effects. You may report side effects to FDA at 1-800-FDA-1088. Where should I keep my medicine? Keep out of the reach of children. Store at room temperature between 15 and 30 degrees C (59 and 86 degrees F). Protect from light. Throw away any unused medicine after the expiration date. NOTE: This sheet is a summary. It may not cover all possible information. If you have questions about this medicine, talk to your doctor, pharmacist, or health care provider.    2016, [employer]. (2011-12-09 16:59:39) Lidocaine; Prilocaine cream What is this medicine? LIDOCAINE; PRILOCAINE (LYE doe kane; PRIL oh kane) is a topical anesthetic that causes loss of feeling in the skin and surrounding tissues. It is used to numb the skin before procedures or injections. This medicine may be used for other purposes; ask your health care provider or pharmacist if you have  questions. What should I tell my health care provider before I take this medicine? They need to know if you have any of these conditions: -glucose-6-phosphate deficiencies -heart disease -kidney or liver disease -methemoglobinemia -an unusual or allergic reaction to lidocaine, prilocaine, other medicines, foods, dyes, or preservatives -pregnant or trying to get pregnant -breast-feeding How should I use this medicine? This medicine is for external use only on the skin. Do not take by mouth. Follow the directions on the prescription label. Wash hands before and after use. Do not use more or leave in contact with the skin longer than directed. Do not apply to eyes or open wounds. It can cause irritation and blurred or temporary loss of vision. If this medicine comes in contact with your eyes, immediately rinse the eye with water. Do not touch or rub the eye. Contact your health care provider right away. Talk to your pediatrician regarding the use of this medicine in children. While this medicine may be prescribed for children for selected conditions, precautions do apply. Overdosage: If you think you have taken too much of this medicine contact a poison control center or emergency room at once. NOTE: This medicine is only for you. Do not share this medicine with others. What if I miss a dose? This medicine is usually only applied once prior to each procedure. It must be in contact with the skin for a period  of time for it to work. If you applied this medicine later than directed, tell your health care professional before starting the procedure. What may interact with this medicine? -acetaminophen -chloroquine -dapsone -medicines to control heart rhythm -nitrates like nitroglycerin and nitroprusside -other ointments, creams, or sprays that may contain anesthetic medicine -phenobarbital -phenytoin -quinine -sulfonamides like sulfacetamide, sulfamethoxazole, sulfasalazine and others This list  may not describe all possible interactions. Give your health care provider a list of all the medicines, herbs, non-prescription drugs, or dietary supplements you use. Also tell them if you smoke, drink alcohol, or use illegal drugs. Some items may interact with your medicine. What should I watch for while using this medicine? Be careful to avoid injury to the treated area while it is numb and you are not aware of pain. Avoid scratching, rubbing, or exposing the treated area to hot or cold temperatures until complete sensation has returned. The numb feeling will wear off a few hours after applying the cream. What side effects may I notice from receiving this medicine? Side effects that you should report to your doctor or health care professional as soon as possible: -blurred vision -chest pain -difficulty breathing -dizziness -drowsiness -fast or irregular heartbeat -skin rash or itching -swelling of your throat, lips, or face -trembling Side effects that usually do not require medical attention (report to your doctor or health care professional if they continue or are bothersome): -changes in ability to feel hot or cold -redness and swelling at the application site This list may not describe all possible side effects. Call your doctor for medical advice about side effects. You may report side effects to FDA at 1-800-FDA-1088. Where should I keep my medicine? Keep out of reach of children. Store at room temperature between 15 and 30 degrees C (59 and 86 degrees F). Keep container tightly closed. Throw away any unused medicine after the expiration date. NOTE: This sheet is a summary. It may not cover all possible information. If you have questions about this medicine, talk to your doctor, pharmacist, or health care provider.    2016, [employer]. (2008-01-24 17:14:35)

## 2015-12-28 ENCOUNTER — Encounter (HOSPITAL_COMMUNITY): Payer: PRIVATE HEALTH INSURANCE

## 2015-12-28 ENCOUNTER — Encounter (HOSPITAL_BASED_OUTPATIENT_CLINIC_OR_DEPARTMENT_OTHER): Payer: PRIVATE HEALTH INSURANCE | Admitting: Hematology & Oncology

## 2015-12-28 ENCOUNTER — Encounter (HOSPITAL_BASED_OUTPATIENT_CLINIC_OR_DEPARTMENT_OTHER): Payer: PRIVATE HEALTH INSURANCE

## 2015-12-28 ENCOUNTER — Ambulatory Visit (HOSPITAL_COMMUNITY)
Admission: RE | Admit: 2015-12-28 | Discharge: 2015-12-28 | Disposition: A | Discharge status: Home / Self Care | Payer: 59 | Source: Ambulatory Visit | Attending: Hematology & Oncology | Admitting: Hematology & Oncology

## 2015-12-28 ENCOUNTER — Encounter (HOSPITAL_COMMUNITY): Payer: Self-pay | Admitting: Hematology & Oncology

## 2015-12-28 ENCOUNTER — Other Ambulatory Visit: Payer: Self-pay | Admitting: Radiology

## 2015-12-28 ENCOUNTER — Other Ambulatory Visit: Payer: Self-pay | Admitting: Physician Assistant

## 2015-12-28 VITALS — BP 140/85 | HR 85 | Temp 97.7°F | Resp 16 | Wt 239.8 lb

## 2015-12-28 DIAGNOSIS — C61 Malignant neoplasm of prostate: Secondary | ICD-10-CM

## 2015-12-28 DIAGNOSIS — C7951 Secondary malignant neoplasm of bone: Secondary | ICD-10-CM

## 2015-12-28 DIAGNOSIS — R634 Abnormal weight loss: Secondary | ICD-10-CM

## 2015-12-28 DIAGNOSIS — Z5111 Encounter for antineoplastic chemotherapy: Secondary | ICD-10-CM | POA: Diagnosis not present

## 2015-12-28 DIAGNOSIS — Z720 Tobacco use: Secondary | ICD-10-CM

## 2015-12-28 DIAGNOSIS — R350 Frequency of micturition: Secondary | ICD-10-CM | POA: Diagnosis not present

## 2015-12-28 DIAGNOSIS — R3911 Hesitancy of micturition: Secondary | ICD-10-CM

## 2015-12-28 DIAGNOSIS — G893 Neoplasm related pain (acute) (chronic): Secondary | ICD-10-CM | POA: Diagnosis not present

## 2015-12-28 LAB — CBC WITH DIFFERENTIAL/PLATELET
BASOS PCT: 0 %
Basophils Absolute: 0 10*3/uL (ref 0.0–0.1)
Eosinophils Absolute: 0.3 10*3/uL (ref 0.0–0.7)
Eosinophils Relative: 3 %
HEMATOCRIT: 44.7 % (ref 39.0–52.0)
HEMOGLOBIN: 14.7 g/dL (ref 13.0–17.0)
LYMPHS ABS: 1.7 10*3/uL (ref 0.7–4.0)
LYMPHS PCT: 20 %
MCH: 29.1 pg (ref 26.0–34.0)
MCHC: 32.9 g/dL (ref 30.0–36.0)
MCV: 88.3 fL (ref 78.0–100.0)
MONOS PCT: 8 %
Monocytes Absolute: 0.7 10*3/uL (ref 0.1–1.0)
NEUTROS ABS: 5.7 10*3/uL (ref 1.7–7.7)
NEUTROS PCT: 69 %
Platelets: 254 10*3/uL (ref 150–400)
RBC: 5.06 MIL/uL (ref 4.22–5.81)
RDW: 13.9 % (ref 11.5–15.5)
WBC: 8.3 10*3/uL (ref 4.0–10.5)

## 2015-12-28 LAB — COMPREHENSIVE METABOLIC PANEL
ALBUMIN: 4.2 g/dL (ref 3.5–5.0)
ALK PHOS: 169 U/L — AB (ref 38–126)
ALT: 22 U/L (ref 17–63)
ANION GAP: 7 (ref 5–15)
AST: 19 U/L (ref 15–41)
BILIRUBIN TOTAL: 0.4 mg/dL (ref 0.3–1.2)
BUN: 10 mg/dL (ref 6–20)
CALCIUM: 8.9 mg/dL (ref 8.9–10.3)
CO2: 26 mmol/L (ref 22–32)
Chloride: 104 mmol/L (ref 101–111)
Creatinine, Ser: 0.85 mg/dL (ref 0.61–1.24)
GFR calc Af Amer: >60 mL/min (ref >60)
GLUCOSE: 96 mg/dL (ref 65–99)
Potassium: 4.3 mmol/L (ref 3.5–5.1)
Sodium: 137 mmol/L (ref 135–145)
TOTAL PROTEIN: 7.6 g/dL (ref 6.5–8.1)

## 2015-12-28 LAB — PSA: PSA: 159 ng/mL — AB (ref 0.00–4.00)

## 2015-12-28 MED ORDER — DEGARELIX ACETATE 120 MG ~~LOC~~ SOLR
240.0000 mg | Freq: Once | SUBCUTANEOUS | Status: AC
Start: 2015-12-28 — End: 2015-12-28
  Administered 2015-12-28: 240 mg via SUBCUTANEOUS
  Filled 2015-12-28: qty 6

## 2015-12-28 MED ORDER — FENTANYL 25 MCG/HR TD PT72: 25.0000 ug | MEDICATED_PATCH | TRANSDERMAL | Status: DC

## 2015-12-28 MED ORDER — HYDROCODONE-ACETAMINOPHEN 10-325 MG PO TABS: 1.0000 | ORAL_TABLET | ORAL | Status: DC | PRN

## 2015-12-28 MED ORDER — DEGARELIX ACETATE 120 MG ~~LOC~~ SOLR
240.0000 mg | Freq: Once | SUBCUTANEOUS | Status: DC
  Filled 2015-12-28: qty 6

## 2015-12-28 NOTE — Patient Instructions (Addendum)
[place] at [facility] Discharge Instructions  RECOMMENDATIONS MADE BY THE CONSULTANT AND ANY TEST RESULTS WILL BE SENT TO YOUR REFERRING PHYSICIAN.  Dr. Whitney Muse discussed test results with you and discussed starting treatment with monthly injections. Start firmagon injection treatment today (works to drop testosterone levels as Dr. Whitney Muse discussed.) Port placement planned, office will confirm date/time with you. Labs will be checked for hormone (testosterone) levels and Vitamin D levels. Films of femur today. You will need calcium and vitamin D supplements during treatment. May use benadryl or hydrocortisone lotion if any redness, itching reaction with treatment. Recommended smoking cessation. Discussed pain management options. Follow-up next week. Will start fentanyl patch 25 mcg change every 72 hours (3 days). Recommended dental appointment prior to starting bone medication.   Thank you for choosing [place] at [facility] to provide your oncology and hematology care.  To afford each patient quality time with our provider, please arrive at least 15 minutes before your scheduled appointment time.   Beginning January 23rd 2017 lab work for the [facility] will be done in the  Main lab at [facility] on 1st floor. If you have a lab appointment with the [facility] please come in thru the  Main Entrance and check in at the main information desk  You need to re-schedule your appointment should you arrive 10 or more minutes late.  We strive to give you quality time with our providers, and arriving late affects you and other patients whose appointments are after yours.  Also, if you no show three or more times for appointments you may be dismissed from the clinic at the providers discretion.     Again, thank you for choosing [facility].  Our hope is that these requests will decrease the amount of time that you wait before being seen by  our physicians.       _____________________________________________________________  Should you have questions after your visit to [facility], please contact our office at [phone] between the hours of 8:30 a.m. and 4:30 p.m.  Voicemails left after 4:30 p.m. will not be returned until the following business day.  For prescription refill requests, have your pharmacy contact our office.         Resources For Cancer Patients and their Caregivers ? American Cancer Society: Can assist with transportation, wigs, general needs, runs Look Good Feel Better.        301-770-4811 ? Cancer Care: Provides financial assistance, online support groups, medication/co-pay assistance.  1-800-813-HOPE 4143890607) ? [place] Assists [place] cancer patients and their families through emotional , educational and financial support.  2253853203 ? [place] DSS Where to apply for food stamps, Medicaid and utility assistance. [phone] ? RCATS: Transportation to medical appointments. ([phone] 300-1680 ? Social Security Administration: May apply for disability if have a Stage IV cancer. 857-515-1639 984-414-1682 ? [employer], Disability and Transit Services: Assists with nutrition, care and transit needs. [place] Support Programs: @10RELATIVEDAYS @ > Cancer Support Group  2nd Tuesday of the month 1pm-2pm, Journey Room  > Creative Journey  3rd Tuesday of the month 1130am-1pm, Journey Room  > Look Good Feel Better  1st Wednesday of the month 10am-12 noon, Journey Room (Call [place] to register [phone])

## 2015-12-28 NOTE — Patient Instructions (Addendum)
[place] at [facility] Discharge Instructions  RECOMMENDATIONS MADE BY THE CONSULTANT AND ANY TEST RESULTS WILL BE SENT TO YOUR REFERRING PHYSICIAN.  You were seen by Dr. Kataleyah Carducci Muse. Port placement scheduled. Chemotherapy scheduled to start next week. Firmagon injection today. Lab work today. Call the clinic with any concerns or questions.   (Firmagon)Degarelix injection What is this medicine? DEGARELIX (deg a REL ix) is used to treat men with advanced prostate cancer. This medicine may be used for other purposes; ask your health care provider or pharmacist if you have questions. What should I tell my health care provider before I take this medicine? They need to know if you have any of these conditions: -heart disease -kidney disease -liver disease -low levels of potassium or magnesium in the blood -osteoporosis -an unusual or allergic reaction to degarelix, mannitol, other medicines, foods, dyes, or preservatives -pregnant or trying to get pregnant -breast-feeding How should I use this medicine? This medicine is for injection under the skin. It is usually given by a health care professional in a hospital or clinic setting. If you get this medicine at home, you will be taught how to prepare and give this medicine. Use exactly as directed. Take your medicine at regular intervals. Do not take it more often than directed. It is important that you put your used needles and syringes in a special sharps container. Do not put them in a trash can. If you do not have a sharps container, call your pharmacist or healthcare provider to get one. Talk to your pediatrician regarding the use of this medicine in children. Special care may be needed. Overdosage: If you think you have taken too much of this medicine contact a poison control center or emergency room at once. NOTE: This medicine is only for you. Do not share this medicine with others. What if I miss a dose? Try not to  miss a dose. If you do miss a dose, call your doctor or health care professional for advice. What may interact with this medicine? Do not take this medicine with any of the following medications: -amiodarone -bretylium -disopyramide -dofetilide -droperidol -ibutilide -procainamide -quinidine -sotalol This list may not describe all possible interactions. Give your health care provider a list of all the medicines, herbs, non-prescription drugs, or dietary supplements you use. Also tell them if you smoke, drink alcohol, or use illegal drugs. Some items may interact with your medicine. What should I watch for while using this medicine? Visit your doctor or health care professional for regular checks on your progress and discuss any issues before you start taking this medicine. Do not rub or scratch injection site. There may be a lump at the injection site, or it may be red or sore for a few days after your dose. Your doctor or health care professional will need to monitor your hormone levels in your blood to check your response to treatment. Try to keep any appointments for testing. What side effects may I notice from receiving this medicine? Side effects that you should report to your doctor or health care professional as soon as possible: -allergic reactions like skin rash, itching or hives, swelling of the face, lips, or tongue -fever or chills -irregular heartbeat -nausea and vomiting along with severe abdominal pain -pain or difficulty passing urine -pelvic pain or bloating Side effects that usually do not require medical attention (Report these to your doctor or health care professional if they continue or are bothersome.): -change in sex  drive or performance -constipation -headache -high blood pressure - hot flashes (flushing of skin, increased sweating) - itching, redness or mild pain at site where injected -joint pain -trouble sleeping -unusually weak or tired -weight  gain This list may not describe all possible side effects. Call your doctor for medical advice about side effects. You may report side effects to FDA at 1-800-FDA-1088.    2016, Elsevier/Gold Standard. (2014-07-26 17:38:48)  (Taxotere)Docetaxel injection What is this medicine? DOCETAXEL (doe se TAX el) is a chemotherapy drug. It targets fast dividing cells, like cancer cells, and causes these cells to die. This medicine is used to treat many types of cancers like breast cancer, certain stomach cancers, head and neck cancer, lung cancer, and prostate cancer. This medicine may be used for other purposes; ask your health care provider or pharmacist if you have questions. What should I tell my health care provider before I take this medicine? They need to know if you have any of these conditions: -infection (especially a virus infection such as chickenpox, cold sores, or herpes) -liver disease -low blood counts, like low white cell, platelet, or red cell counts -an unusual or allergic reaction to docetaxel, polysorbate 80, other chemotherapy agents, other medicines, foods, dyes, or preservatives -pregnant or trying to get pregnant -breast-feeding How should I use this medicine? This drug is given as an infusion into a vein. It is administered in a hospital or clinic by a specially trained health care professional. Talk to your pediatrician regarding the use of this medicine in children. Special care may be needed. Overdosage: If you think you have taken too much of this medicine contact a poison control center or emergency room at once. NOTE: This medicine is only for you. Do not share this medicine with others. What if I miss a dose? It is important not to miss your dose. Call your doctor or health care professional if you are unable to keep an appointment. What may interact with this medicine? -cyclosporine -erythromycin -ketoconazole -medicines to increase blood counts like filgrastim,  pegfilgrastim, sargramostim -vaccines Talk to your doctor or health care professional before taking any of these medicines: -acetaminophen -aspirin -ibuprofen -ketoprofen -naproxen This list may not describe all possible interactions. Give your health care provider a list of all the medicines, herbs, non-prescription drugs, or dietary supplements you use. Also tell them if you smoke, drink alcohol, or use illegal drugs. Some items may interact with your medicine. What should I watch for while using this medicine? Your condition will be monitored carefully while you are receiving this medicine. You will need important blood work done while you are taking this medicine. This drug may make you feel generally unwell. This is not uncommon, as chemotherapy can affect healthy cells as well as cancer cells. Report any side effects. Continue your course of treatment even though you feel ill unless your doctor tells you to stop. In some cases, you may be given additional medicines to help with side effects. Follow all directions for their use. Call your doctor or health care professional for advice if you get a fever, chills or sore throat, or other symptoms of a cold or flu. Do not treat yourself. This drug decreases your body's ability to fight infections. Try to avoid being around people who are sick. This medicine may increase your risk to bruise or bleed. Call your doctor or health care professional if you notice any unusual bleeding. This medicine may contain alcohol in the product. You may get drowsy  or dizzy. Do not drive, use machinery, or do anything that needs mental alertness until you know how this medicine affects you. Do not stand or sit up quickly, especially if you are an older patient. This reduces the risk of dizzy or fainting spells. Avoid alcoholic drinks. Do not become pregnant while taking this medicine. Women should inform their doctor if they wish to become pregnant or think they might  be pregnant. There is a potential for serious side effects to an unborn child. Talk to your health care professional or pharmacist for more information. Do not breast-feed an infant while taking this medicine. What side effects may I notice from receiving this medicine? Side effects that you should report to your doctor or health care professional as soon as possible: -allergic reactions like skin rash, itching or hives, swelling of the face, lips, or tongue -low blood counts - This drug may decrease the number of white blood cells, red blood cells and platelets. You may be at increased risk for infections and bleeding. -signs of infection - fever or chills, cough, sore throat, pain or difficulty passing urine -signs of decreased platelets or bleeding - bruising, pinpoint red spots on the skin, black, tarry stools, nosebleeds -signs of decreased red blood cells - unusually weak or tired, fainting spells, lightheadedness -breathing problems -fast or irregular heartbeat -low blood pressure -mouth sores -nausea and vomiting -pain, swelling, redness or irritation at the injection site -pain, tingling, numbness in the hands or feet -swelling of the ankle, feet, hands -weight gain Side effects that usually do not require medical attention (report to your prescriber or health care professional if they continue or are bothersome): -bone pain -complete hair loss including hair on your head, underarms, pubic hair, eyebrows, and eyelashes -diarrhea -excessive tearing -changes in the color of fingernails -loosening of the fingernails -nausea -muscle pain -red flush to skin -sweating -weak or tired This list may not describe all possible side effects. Call your doctor for medical advice about side effects. You may report side effects to FDA at 1-800-FDA-1088. Where should I keep my medicine? This drug is given in a hospital or clinic and will not be stored at home. NOTE: This sheet is a summary.  It may not cover all possible information. If you have questions about this medicine, talk to your doctor, pharmacist, or health care provider.    2016, Elsevier/Gold Standard. (2014-08-07 16:04:57)    Thank you for choosing [place] at [facility] to provide your oncology and hematology care.  To afford each patient quality time with our provider, please arrive at least 15 minutes before your scheduled appointment time.   Beginning January 23rd 2017 lab work for the [facility] will be done in the  Main lab at [facility] on 1st floor. If you have a lab appointment with the [facility] please come in thru the  Main Entrance and check in at the main information desk  You need to re-schedule your appointment should you arrive 10 or more minutes late.  We strive to give you quality time with our providers, and arriving late affects you and other patients whose appointments are after yours.  Also, if you no show three or more times for appointments you may be dismissed from the clinic at the providers discretion.     Again, thank you for choosing [facility].  Our hope is that these requests will decrease the amount of time that you wait before  being seen by our physicians.       _____________________________________________________________  Should you have questions after your visit to [facility], please contact our office at [phone] between the hours of 8:30 a.m. and 4:30 p.m.  Voicemails left after 4:30 p.m. will not be returned until the following business day.  For prescription refill requests, have your pharmacy contact our office.         Resources For Cancer Patients and their Caregivers ? American Cancer Society: Can assist with transportation, wigs, general needs, runs Look Good Feel Better.        402-649-5924 ? Cancer Care: Provides financial assistance, online support groups, medication/co-pay assistance.   1-800-813-HOPE 662-496-3805) ? [place] Assists [place] cancer patients and their families through emotional , educational and financial support.  609-049-2594 ? [place] DSS Where to apply for food stamps, Medicaid and utility assistance. (813)709-9658 ? [employer]: Transportation to medical appointments. ([phone] ? Social Security Administration: May apply for disability if have a Stage IV cancer. ([phone] 573-625-1442 ? [employer], Disability and Transit Services: Assists with nutrition, care and transit needs. [place] Support Programs: @10RELATIVEDAYS @ > Cancer Support Group  2nd Tuesday of the month 1pm-2pm, Journey Room  > Creative Journey  3rd Tuesday of the month 1130am-1pm, Journey Room  > Look Good Feel Better  1st Wednesday of the month 10am-12 noon, Journey Room (Call [place] to register [phone])

## 2015-12-28 NOTE — Progress Notes (Signed)
[place]  Progress Note  Patient Care Team: [name], MD as PCP - General (Family Medicine)  CHIEF COMPLAINTS/PURPOSE OF CONSULTATION:  CT angiography chest w/contrast 12/10/2015 multiple thoracic and rib osseous lesions suggesting osseous metastatic disease. No obvious primary PSA 12/12/2015 153.85    Prostate carcinoma ([place])   12/10/2015 Imaging CTA chest, multiple thoracic and rib osseous lesions. no primary evident. Atherosclerosis including aortic and CAD   12/12/2015 Imaging CT abdomen/pelvis with sclerotic osseous lesions througout the lumbar spine, sacrum, bilateral iliac bones and L proximal femur worrisome for sclerotic osseous mets,mild prostatomegaly, no LAD, Infrarenal 3.8 cm AAA   12/12/2015 Tumor Marker PSA 153.85   12/18/2015 Imaging Bone Scan Diffuse bony metastatic disease, posterior calvarium, sternum, thoracic, lumbar spine, bilateral ribs, L shoulder, bilateral bony pelvis, proximal L femur   12/24/2015 Initial Biopsy CT biopsy of L iliac bone lesion   12/26/2015 Pathology Results Bone, biopsy, left iliac - POSITIVE FOR METASTATIC ADENOCARCINOMA.   12/28/2015 -  Chemotherapy Firmagon instituted 240 mg      HISTORY OF PRESENTING ILLNESS:  MATTY [name] 60 y.o. male is here because of imaging showing widespread bony metastatic disease. PSA obtained on 12/12/2015 was 153.85. He has no prior known history of prostate cancer. He does report urinary urgency and frequency. Occasional urinary hesitancy. He has undergone biopsy and is here to review results.   Matthew [name] is accompanied by his daughters. I personally reviewed and went over pathology results and imaging.  His sternum continues to hurt constantly. "When I cough or sneeze, I just feel like I'm blowin' apart". He has stopped working. Reports he cannot even pick up 15 lbs, he is in so much pain.  The patient is here for further evaluation and discussion of newly diagnosed prostate cancer along with  treatment options.  He has been pre-authorized for firmagon, xgeva, taxotere. He is willing to proceed with therapy. At our initial visit given the high index of suspicion for prostate cancer, he was given reading information.  His major complaint today is pain. Appetite is still good. Weight is stable. He continues to smoke.   MEDICAL HISTORY:  Past Medical History  Diagnosis Date  . DVT (deep venous thrombosis) ([place])   . Arthritis   . Cancer [facility])     Prostate  . Kidney stones     SURGICAL HISTORY: Past Surgical History  Procedure Laterality Date  . Circumcision  30 years ago    SOCIAL HISTORY: Social History   Social History  . Marital Status: Single    Spouse Name: N/A  . Number of Children: N/A  . Years of Education: N/A   Occupational History  . Not on file.   Social History Main Topics  . Smoking status: Current Every Day Smoker -- 1.00 packs/day    Types: Cigarettes  . Smokeless tobacco: Not on file  . Alcohol Use: Yes     Comment: occ  . Drug Use: No  . Sexual Activity: Not on file   Other Topics Concern  . Not on file   Social History Narrative  Divorced for 50 years 2 girls 4 grandchildren Smokes pack a day; Started at 60 years old Beer every once in a while  Hobbies-plays pool Works in Architect  FAMILY HISTORY: No family history on file.  Grandmother (adopted him) Mother died in 48s from bone cancer Never met father Brothers and sisters all died from cancer 3 brother 3 sisters   ALLERGIES:  is  allergic to meloxicam.  MEDICATIONS:  Current Outpatient Prescriptions  Medication Sig Dispense Refill  . aspirin EC 81 MG tablet Take 81 mg by mouth daily. Reported on 12/28/2015    . Calcium Carb-Cholecalciferol (CALCIUM 500 + D3) 500-600 MG-UNIT TABS Take 2 tablets by mouth daily. 60 tablet 6  . Degarelix Acetate (FIRMAGON ) Inject into the skin every 28 (twenty-eight) days. Reported on 12/28/2015    . denosumab (XGEVA) 120 MG/1.7ML SOLN  injection Inject 120 mg into the skin every 28 (twenty-eight) days.    Marland Kitchen dexamethasone (DECADRON) 4 MG tablet The day before, day of, and day after chemo take 2 tabs in am and 2 tabs in pm. Take with food. (Patient not taking: Reported on 12/28/2015) 36 tablet 1  . DOCEtaxel (TAXOTERE IV) Inject into the vein. Has not started yet, to be given every 21 days    . fentaNYL (DURAGESIC - DOSED MCG/HR) 25 MCG/HR patch Place 1 patch (25 mcg total) onto the skin every 3 (three) days. May change to 2 patches after 3 days if needed 10 patch 0  . HYDROcodone-acetaminophen (NORCO) 10-325 MG tablet Take 1 tablet by mouth every 4 (four) hours as needed. 60 tablet 0  . ibuprofen (ADVIL,MOTRIN) 600 MG tablet Take 300 mg by mouth 2 (two) times daily as needed for moderate pain. Reported on 12/28/2015    . lidocaine-prilocaine (EMLA) cream Apply a quarter size amount to port site 1 hour prior to chemo. Do not rub in. Cover with plastic wrap. 30 g 3  . Liniments (BLUE-EMU SUPER STRENGTH EX) Apply 1 application topically 4 (four) times daily as needed (knee/hip pain). Reported on 12/28/2015    . ondansetron (ZOFRAN) 8 MG tablet Take 1 tablet (8 mg total) by mouth every 8 (eight) hours as needed for nausea or vomiting. (Patient not taking: Reported on 12/28/2015) 30 tablet 2  . pegfilgrastim (NEULASTA ONPRO) 6 MG/0.6ML injection Inject 6 mg into the skin every 21 ( twenty-one) days. Inject via provided programmed delivery device.    . prochlorperazine (COMPAZINE) 10 MG tablet Take 1 tablet (10 mg total) by mouth every 6 (six) hours as needed for nausea or vomiting. (Patient not taking: Reported on 12/28/2015) 30 tablet 2   No current facility-administered medications for this visit.   Facility-Administered Medications Ordered in Other Visits  Medication Dose Route Frequency Provider Last Rate Last Dose  . 0.9 %  sodium chloride infusion   Intravenous Continuous Ardis Rowan, PA-C 50 mL/hr at 01/01/16 1253    .  fentaNYL (SUBLIMAZE) 100 MCG/2ML injection           . heparin lock flush 100 UNIT/ML injection           . midazolam (VERSED) 2 MG/2ML injection             Review of Systems  Constitutional: Positive for malaise/fatigue.  HENT: Negative.   Eyes: Negative.   Respiratory: Negative.   Cardiovascular: Positive for chest pain.  Gastrointestinal: Negative.   Genitourinary: Positive for dysuria.       Every once in a while Dysuria. Has nocturia goes once or twice a night  Musculoskeletal: Positive for joint pain.       Hip pain   Skin: Negative.   Neurological: Negative.   Endo/Heme/Allergies: Negative.   Psychiatric/Behavioral: Negative.   All other systems reviewed and are negative. 14 point ROS was done and is otherwise as detailed above or in HPI   PHYSICAL EXAMINATION: ECOG PERFORMANCE STATUS:  1 - Symptomatic but completely ambulatory  Filed Vitals:   12/28/15 0815  BP: 140/85  Pulse: 85  Temp: 97.7 F (36.5 C)  Resp: 16   Filed Weights   12/28/15 0815  Weight: 239 lb 12.8 oz (108.773 kg)    Physical Exam  Constitutional: He is oriented to person, place, and time and well-developed, well-nourished, and in no distress.  HENT:  Head: Normocephalic and atraumatic.  Nose: Nose normal.  Mouth/Throat: Oropharynx is clear and moist. No oropharyngeal exudate.  Poor dentition with multiple missing teeth  Eyes: Conjunctivae and EOM are normal. Pupils are equal, round, and reactive to light. Right eye exhibits no discharge. Left eye exhibits no discharge. No scleral icterus.  Neck: Normal range of motion. Neck supple. No tracheal deviation present. No thyromegaly present.  Cardiovascular: Normal rate, regular rhythm and normal heart sounds.  Exam reveals no gallop and no friction rub.   No murmur heard. Pulmonary/Chest: Effort normal. He has no wheezes. He has no rales.  Decreased throughout  Abdominal: Soft. Bowel sounds are normal. He exhibits no distension and no mass.  There is no tenderness. There is no rebound and no guarding.  Musculoskeletal: Normal range of motion. He exhibits no edema.  Lymphadenopathy:    He has no cervical adenopathy.  Neurological: He is alert and oriented to person, place, and time. He has normal reflexes. No cranial nerve deficit. Gait normal. Coordination normal.  Skin: Skin is warm and dry. No rash noted.  Psychiatric: Mood, memory, affect and judgment normal.  Nursing note and vitals reviewed.  LABORATORY DATA:  I have reviewed the data as listed Results for Matthew [name], Matthew [name] (MRN [id number]) as of 12/28/2015 08:31  Ref. Range 12/24/2015 07:40  WBC Latest Ref Range: 4.0-10.5 K/uL 7.3  RBC Latest Ref Range: 4.22-5.81 MIL/uL 4.82  Hemoglobin Latest Ref Range: 13.0-17.0 g/dL 14.1  HCT Latest Ref Range: 39.0-52.0 % 42.1  MCV Latest Ref Range: 78.0-100.0 fL 87.3  MCH Latest Ref Range: 26.0-34.0 pg 29.3  MCHC Latest Ref Range: 30.0-36.0 g/dL 33.5  RDW Latest Ref Range: 11.5-15.5 % 13.9  Platelets Latest Ref Range: 150-400 K/uL 227  Prothrombin Time Latest Ref Range: 11.6-15.2 seconds 13.4  INR Latest Ref Range: 0.00-1.49  1.04  APTT Latest Ref Range: 24-37 seconds 34         RADIOGRAPHIC STUDIES: I have personally reviewed the radiological images as listed and agreed with the findings in the report.  Study Result     CLINICAL DATA: 60 year old male with sclerotic bony lesions identified on recent chest abdomen and pelvis CT. Elevated PSA  EXAM: NUCLEAR MEDICINE WHOLE BODY BONE SCAN  TECHNIQUE: Whole body anterior and posterior images were obtained approximately 3 hours after intravenous injection of radiopharmaceutical.  RADIOPHARMACEUTICALS: 25.6 mCi Technetium-72mMDP IV  COMPARISON: 12/12/2015 CT  FINDINGS: Areas of increased focal activity within the posterior calvarium, sternum, thoracic and lumbar spine, bilateral ribs, left shoulder, bilateral bony pelvis, and proximal left femur are  compatible with bony metastatic disease.  Degenerative changes within both knees noted, left-greater-than-right.  IMPRESSION: Diffuse bony metastatic disease as described.   Electronically Signed  By: JMargarette CanadaM.D.  On: 12/18/2015 14:57     PATHOLOGY   ASSESSMENT & PLAN:  Stage IV adenocarcinoma of prostate, hormone sensitive disease Bony Metastatic Disease Abnormal CT imaging Elevated PSA Urinary hesitancy, frequency Tobacco Abuse Cancer related Pain Weight loss  We again reviewed initial therapy of stage IV prostate cancer and will proceed as follows:  1.  Free and total testosterone levels today followed by Firmagon 240 mg today and 80 mg monthly. Will consider changing to [name] at a later date.  2. Port a cath placement 3. Taxotere given disease burden and proven benefit in regards to OS  4. Institution of zometa or xgeva, with prior dental exam 5. Calcium + vitamin D, vitamin D level will be checked today as well.  7. Pain control  I personally reviewed and went over pathology results and bone scans.   I have written the patient a prescription for fentanyl patch 25 mcg. If he continues to have pain when he changes the patch in 72 hours, he can place two patches on for a 50 mcg.  He has been scheduled for a port placement next week. We will plan for 6 total treatments of Taxotere/prednisone. He will return next week for chemotherapy teaching.  All questions were answered. The patient knows to call the clinic with any problems, questions or concerns.  This document serves as a record of services personally performed by Ancil Linsey, MD. It was created on her behalf by Arlyce Harman, a trained medical scribe. The creation of this record is based on the scribe's personal observations and the provider's statements to them. This document has been checked and approved by the attending provider.  I have reviewed the above documentation for accuracy and  completeness, and I agree with the above.  This note was electronically signed.    Molli Hazard, MD  01/01/2016 6:03 PM

## 2015-12-28 NOTE — Progress Notes (Signed)
Matthew [name] presents today for injection per MD orders. Firmagon 240mg  administered SQ in left and right abdomen in divided doses.   Administration without incident. Patient tolerated well. Consents signed for Matthew [name], and taxotere.

## 2015-12-29 LAB — TESTOSTERONE: TESTOSTERONE: 285 ng/dL — AB (ref 348–1197)

## 2015-12-29 LAB — TESTOSTERONE, FREE: Testosterone, Free: 8.6 pg/mL (ref 6.6–18.1)

## 2015-12-29 LAB — VITAMIN D 25 HYDROXY (VIT D DEFICIENCY, FRACTURES): VIT D 25 HYDROXY: 15.2 ng/mL — AB (ref 30.0–100.0)

## 2016-01-01 ENCOUNTER — Other Ambulatory Visit (HOSPITAL_COMMUNITY): Payer: Self-pay | Admitting: Hematology & Oncology

## 2016-01-01 ENCOUNTER — Telehealth (HOSPITAL_COMMUNITY): Payer: Self-pay | Admitting: Emergency Medicine

## 2016-01-01 ENCOUNTER — Other Ambulatory Visit (HOSPITAL_COMMUNITY): Payer: Self-pay | Admitting: Oncology

## 2016-01-01 ENCOUNTER — Ambulatory Visit (HOSPITAL_COMMUNITY)
Admission: RE | Admit: 2016-01-01 | Discharge: 2016-01-01 | Disposition: A | Discharge status: Home / Self Care | Payer: 59 | Source: Ambulatory Visit | Attending: Hematology & Oncology | Admitting: Hematology & Oncology

## 2016-01-01 ENCOUNTER — Encounter (HOSPITAL_COMMUNITY): Payer: Self-pay

## 2016-01-01 DIAGNOSIS — C61 Malignant neoplasm of prostate: Secondary | ICD-10-CM

## 2016-01-01 DIAGNOSIS — Z79899 Other long term (current) drug therapy: Secondary | ICD-10-CM | POA: Insufficient documentation

## 2016-01-01 DIAGNOSIS — C7951 Secondary malignant neoplasm of bone: Secondary | ICD-10-CM | POA: Diagnosis present

## 2016-01-01 DIAGNOSIS — Z8546 Personal history of malignant neoplasm of prostate: Secondary | ICD-10-CM | POA: Diagnosis not present

## 2016-01-01 DIAGNOSIS — Z79891 Long term (current) use of opiate analgesic: Secondary | ICD-10-CM | POA: Insufficient documentation

## 2016-01-01 DIAGNOSIS — Z86718 Personal history of other venous thrombosis and embolism: Secondary | ICD-10-CM | POA: Insufficient documentation

## 2016-01-01 DIAGNOSIS — M1990 Unspecified osteoarthritis, unspecified site: Secondary | ICD-10-CM | POA: Diagnosis not present

## 2016-01-01 DIAGNOSIS — Z791 Long term (current) use of non-steroidal anti-inflammatories (NSAID): Secondary | ICD-10-CM | POA: Insufficient documentation

## 2016-01-01 DIAGNOSIS — Z7982 Long term (current) use of aspirin: Secondary | ICD-10-CM | POA: Diagnosis not present

## 2016-01-01 DIAGNOSIS — F17210 Nicotine dependence, cigarettes, uncomplicated: Secondary | ICD-10-CM | POA: Diagnosis not present

## 2016-01-01 HISTORY — DX: Calculus of kidney: N20.0

## 2016-01-01 LAB — PROTIME-INR
INR: 0.96 (ref 0.00–1.49)
PROTHROMBIN TIME: 13 s (ref 11.6–15.2)

## 2016-01-01 LAB — CBC
HCT: 45.8 % (ref 39.0–52.0)
Hemoglobin: 15.1 g/dL (ref 13.0–17.0)
MCH: 28.9 pg (ref 26.0–34.0)
MCHC: 33 g/dL (ref 30.0–36.0)
MCV: 87.6 fL (ref 78.0–100.0)
PLATELETS: 215 10*3/uL (ref 150–400)
RBC: 5.23 MIL/uL (ref 4.22–5.81)
RDW: 13.7 % (ref 11.5–15.5)
WBC: 6.3 10*3/uL (ref 4.0–10.5)

## 2016-01-01 LAB — APTT: APTT: 33 s (ref 24–37)

## 2016-01-01 MED ORDER — FENTANYL CITRATE (PF) 100 MCG/2ML IJ SOLN
INTRAMUSCULAR | Status: AC
  Filled 2016-01-01: qty 2

## 2016-01-01 MED ORDER — HEPARIN SOD (PORK) LOCK FLUSH 100 UNIT/ML IV SOLN
INTRAVENOUS | Status: AC | PRN
  Administered 2016-01-01: 500 [IU]

## 2016-01-01 MED ORDER — LIDOCAINE HCL 1 % IJ SOLN
INTRAMUSCULAR | Status: AC
  Filled 2016-01-01: qty 20

## 2016-01-01 MED ORDER — MIDAZOLAM HCL 2 MG/2ML IJ SOLN
INTRAMUSCULAR | Status: AC | PRN
  Administered 2016-01-01 (×2): 1 mg via INTRAVENOUS
  Administered 2016-01-01 (×3): 0.5 mg via INTRAVENOUS

## 2016-01-01 MED ORDER — FENTANYL CITRATE (PF) 100 MCG/2ML IJ SOLN
INTRAMUSCULAR | Status: AC | PRN
  Administered 2016-01-01 (×2): 25 ug via INTRAVENOUS
  Administered 2016-01-01: 50 ug via INTRAVENOUS

## 2016-01-01 MED ORDER — LIDOCAINE HCL 1 % IJ SOLN
INTRAMUSCULAR | Status: AC | PRN
  Administered 2016-01-01: 10 mL

## 2016-01-01 MED ORDER — SODIUM CHLORIDE 0.9 % IV SOLN
INTRAVENOUS | Status: DC
  Administered 2016-01-01: 13:00:00 via INTRAVENOUS

## 2016-01-01 MED ORDER — HEPARIN SOD (PORK) LOCK FLUSH 100 UNIT/ML IV SOLN
INTRAVENOUS | Status: AC
  Filled 2016-01-01: qty 5

## 2016-01-01 MED ORDER — CEFAZOLIN SODIUM-DEXTROSE 2-4 GM/100ML-% IV SOLN
2.0000 g | Freq: Once | INTRAVENOUS | Status: AC
  Administered 2016-01-01: 2 g via INTRAVENOUS
  Filled 2016-01-01 (×2): qty 100

## 2016-01-01 MED ORDER — MIDAZOLAM HCL 2 MG/2ML IJ SOLN
INTRAMUSCULAR | Status: DC
Start: 2016-01-01 — End: 2016-01-02
  Filled 2016-01-01: qty 4

## 2016-01-01 NOTE — Sedation Documentation (Signed)
Patient denies pain and is resting comfortably.  

## 2016-01-01 NOTE — H&P (Signed)
Chief Complaint: metastatic prostate cancer  Referring Physician:Dr. Ancil Linsey  Supervising Physician: Marybelle Killings  Patient Status: Out-pt  HPI: Matthew [name] is an 60 y.o. male who is known to [place] for a recent iliac bone biopsy which confirmed metastatic prostate cancer.  He represents today for placement of a PAC in order to start chemotherapy next week.  No change to his history since last week.  Past Medical History:  Past Medical History  Diagnosis Date  . DVT (deep venous thrombosis) ([place])   . Arthritis   . Cancer [facility])     Prostate  . Kidney stones     Past Surgical History:  Past Surgical History  Procedure Laterality Date  . Circumcision  30 years ago    Family History: History reviewed. No pertinent family history.  Social History:  reports that he has been smoking Cigarettes.  He has been smoking about 1.00 pack per day. He does not have any smokeless tobacco history on file. He reports that he drinks alcohol. He reports that he does not use illicit drugs.  Allergies:  Allergies  Allergen Reactions  . Meloxicam Other (See Comments)    Caused stomach bleeding.    Medications:   Medication List    ASK your doctor about these medications        aspirin EC 81 MG tablet  Take 81 mg by mouth daily. Reported on 12/28/2015     BLUE-EMU SUPER STRENGTH EX  Apply 1 application topically 4 (four) times daily as needed (knee/hip pain). Reported on 12/28/2015     Calcium Carb-Cholecalciferol 500-600 MG-UNIT Tabs  Commonly known as:  CALCIUM 500 + D3  Take 2 tablets by mouth daily.     dexamethasone 4 MG tablet  Commonly known as:  DECADRON  The day before, day of, and day after chemo take 2 tabs in am and 2 tabs in pm. Take with food.     fentaNYL 25 MCG/HR patch  Commonly known as:  DURAGESIC - dosed mcg/hr  Place 1 patch (25 mcg total) onto the skin every 3 (three) days. May change to 2 patches after 3 days if needed     [facility] [place]  Inject into  the skin every 28 (twenty-eight) days. Reported on 12/28/2015     HYDROcodone-acetaminophen 10-325 MG tablet  Commonly known as:  NORCO  Take 1 tablet by mouth every 4 (four) hours as needed.     ibuprofen 600 MG tablet  Commonly known as:  ADVIL,MOTRIN  Take 300 mg by mouth 2 (two) times daily as needed for moderate pain. Reported on 12/28/2015     lidocaine-prilocaine cream  Commonly known as:  EMLA  Apply a quarter size amount to port site 1 hour prior to chemo. Do not rub in. Cover with plastic wrap.     NEULASTA ONPRO 6 MG/0.6ML injection  Generic drug:  pegfilgrastim  Inject 6 mg into the skin every 21 ( twenty-one) days. Inject via provided programmed delivery device.     ondansetron 8 MG tablet  Commonly known as:  ZOFRAN  Take 1 tablet (8 mg total) by mouth every 8 (eight) hours as needed for nausea or vomiting.     prochlorperazine 10 MG tablet  Commonly known as:  COMPAZINE  Take 1 tablet (10 mg total) by mouth every 6 (six) hours as needed for nausea or vomiting.     TAXOTERE IV  Inject into the vein. Has not started yet, to be given every  21 days     XGEVA 120 MG/1.7ML Soln injection  Generic drug:  denosumab  Inject 120 mg into the skin every 28 (twenty-eight) days.        Please HPI for pertinent positives, otherwise complete 10 system ROS negative.  Mallampati Score: MD Evaluation Airway: WNL Heart: WNL Abdomen: WNL Chest/ Lungs: WNL ASA  Classification: 3 Mallampati/Airway Score: Two  Physical Exam: There were no vitals taken for this visit. There is no weight on file to calculate BMI. General: pleasant, WD, WN white male who is laying in bed in NAD HEENT: head is normocephalic, atraumatic.  Sclera are noninjected.  PERRL.  Ears and nose without any masses or lesions.  Mouth is pink and moist Heart: regular, rate, and rhythm.  Normal s1,s2. No obvious murmurs, gallops, or rubs noted.  Palpable radial and pedal pulses bilaterally Lungs: CTAB except  slight expiratory wheeze on the right side, otherwise no rhonchi, or rales noted.  Respiratory effort nonlabored Abd: soft, NT, ND, +BS, no masses, hernias, or organomegaly MS: all 4 extremities are symmetrical with no cyanosis, clubbing, or edema. Psych: A&Ox3 with an appropriate affect.   Labs: Results for orders placed or performed during the hospital encounter of 01/01/16 (from the past 48 hour(s))  APTT upon arrival     Status: None   Collection Time: 01/01/16 12:40 PM  Result Value Ref Range   aPTT 33 24 - 37 seconds  CBC upon arrival     Status: None   Collection Time: 01/01/16 12:40 PM  Result Value Ref Range   WBC 6.3 4.0 - 10.5 K/uL   RBC 5.23 4.22 - 5.81 MIL/uL   Hemoglobin 15.1 13.0 - 17.0 g/dL   HCT 45.8 39.0 - 52.0 %   MCV 87.6 78.0 - 100.0 fL   MCH 28.9 26.0 - 34.0 pg   MCHC 33.0 30.0 - 36.0 g/dL   RDW 13.7 11.5 - 15.5 %   Platelets 215 150 - 400 K/uL  Protime-INR upon arrival     Status: None   Collection Time: 01/01/16 12:40 PM  Result Value Ref Range   Prothrombin Time 13.0 11.6 - 15.2 seconds   INR 0.96 0.00 - 1.49    Imaging: No results found.  Assessment/Plan 1. Metastatic prostate cancer -he will start chemotherapy next week and needs a PAC placed. -vitals and labs reviewed -Risks and Benefits discussed with the patient including, but not limited to bleeding, infection, pneumothorax, or fibrin sheath development and need for additional procedures. All of the patient's questions were answered, patient is agreeable to proceed. Consent signed and in chart.   Thank you for this interesting consult.  I greatly enjoyed meeting Matthew [name] and look forward to participating in their care.  A copy of this report was sent to the requesting provider on this date.  Electronically Signed: Henreitta Cea 01/01/2016, 1:57 PM   I spent a total of    25 Minutes in face to face in clinical consultation, greater than 50% of which was counseling/coordinating care  for metastatic prostate cancer, poor venous access

## 2016-01-01 NOTE — Telephone Encounter (Signed)
24 hour follow up- pts family member states that he has did well after chemo injection.  His pain level has been lower.  Only complaint is injection site is tender.

## 2016-01-01 NOTE — Discharge Instructions (Signed)
Implanted Port Insertion, Care After °Refer to this sheet in the next few weeks. These instructions provide you with information on caring for yourself after your procedure. Your health care provider may also give you more specific instructions. Your treatment has been planned according to current medical practices, but problems sometimes occur. Call your health care provider if you have any problems or questions after your procedure. °WHAT TO EXPECT AFTER THE PROCEDURE °After your procedure, it is typical to have the following:  °· Discomfort at the port insertion site. Ice packs to the area will help. °· Bruising on the skin over the port. This will subside in 3-4 days. °HOME CARE INSTRUCTIONS °· After your port is placed, you will get a manufacturer's information card. The card has information about your port. Keep this card with you at all times.   °· Know what kind of port you have. There are many types of ports available.   °· Wear a medical alert bracelet in case of an emergency. This can help alert health care workers that you have a port.   °· The port can stay in for as long as your health care provider believes it is necessary.   °· A home health care nurse may give medicines and take care of the port.   °· You or a family member can get special training and directions for giving medicine and taking care of the port at home.   °SEEK MEDICAL CARE IF:  °· Your port does not flush or you are unable to get a blood return.   °· You have a fever or chills. °SEEK IMMEDIATE MEDICAL CARE IF: °· You have new fluid or pus coming from your incision.   °· You notice a bad smell coming from your incision site.   °· You have swelling, pain, or more redness at the incision or port site.   °· You have chest pain or shortness of breath. °  °This information is not intended to replace advice given to you by your health care provider. Make sure you discuss any questions you have with your health care provider. °  °Document  Released: 05/11/2013 Document Revised: 07/26/2013 Document Reviewed: 05/11/2013 °Elsevier Interactive Patient Education ©2016 Elsevier Inc. °Implanted Port Home Guide °An implanted port is a type of central line that is placed under the skin. Central lines are used to provide IV access when treatment or nutrition needs to be given through a person's veins. Implanted ports are used for long-term IV access. An implanted port may be placed because:  °· You need IV medicine that would be irritating to the small veins in your hands or arms.   °· You need long-term IV medicines, such as antibiotics.   °· You need IV nutrition for a long period.   °· You need frequent blood draws for lab tests.   °· You need dialysis.   °Implanted ports are usually placed in the chest area, but they can also be placed in the upper arm, the abdomen, or the leg. An implanted port has two main parts:  °· Reservoir. The reservoir is round and will appear as a small, raised area under your skin. The reservoir is the part where a needle is inserted to give medicines or draw blood.   °· Catheter. The catheter is a thin, flexible tube that extends from the reservoir. The catheter is placed into a large vein. Medicine that is inserted into the reservoir goes into the catheter and then into the vein.   °HOW WILL I CARE FOR MY INCISION SITE? °Do not get the   incision site wet. Bathe or shower as directed by your health care provider.  °HOW IS MY PORT ACCESSED? °Special steps must be taken to access the port:  °· Before the port is accessed, a numbing cream can be placed on the skin. This helps numb the skin over the port site.   °· Your health care provider uses a sterile technique to access the port. °· Your health care provider must put on a mask and sterile gloves. °· The skin over your port is cleaned carefully with an antiseptic and allowed to dry. °· The port is gently pinched between sterile gloves, and a needle is inserted into the  port. °· Only "non-coring" port needles should be used to access the port. Once the port is accessed, a blood return should be checked. This helps ensure that the port is in the vein and is not clogged.   °· If your port needs to remain accessed for a constant infusion, a clear (transparent) bandage will be placed over the needle site. The bandage and needle will need to be changed every week, or as directed by your health care provider.   °· Keep the bandage covering the needle clean and dry. Do not get it wet. Follow your health care provider's instructions on how to take a shower or bath while the port is accessed.   °· If your port does not need to stay accessed, no bandage is needed over the port.   °WHAT IS FLUSHING? °Flushing helps keep the port from getting clogged. Follow your health care provider's instructions on how and when to flush the port. Ports are usually flushed with saline solution or a medicine called heparin. The need for flushing will depend on how the port is used.  °· If the port is used for intermittent medicines or blood draws, the port will need to be flushed:   °· After medicines have been given.   °· After blood has been drawn.   °· As part of routine maintenance.   °· If a constant infusion is running, the port may not need to be flushed.   °HOW LONG WILL MY PORT STAY IMPLANTED? °The port can stay in for as long as your health care provider thinks it is needed. When it is time for the port to come out, surgery will be done to remove it. The procedure is similar to the one performed when the port was put in.  °WHEN SHOULD I SEEK IMMEDIATE MEDICAL CARE? °When you have an implanted port, you should seek immediate medical care if:  °· You notice a bad smell coming from the incision site.   °· You have swelling, redness, or drainage at the incision site.   °· You have more swelling or pain at the port site or the surrounding area.   °· You have a fever that is not controlled with  medicine. °  °This information is not intended to replace advice given to you by your health care provider. Make sure you discuss any questions you have with your health care provider. °  °Document Released: 07/21/2005 Document Revised: 05/11/2013 Document Reviewed: 03/28/2013 °Elsevier Interactive Patient Education ©2016 Elsevier Inc. ° ° °Moderate Conscious Sedation, Adult, Care After °Refer to this sheet in the next few weeks. These instructions provide you with information on caring for yourself after your procedure. Your health care provider may also give you more specific instructions. Your treatment has been planned according to current medical practices, but problems sometimes occur. Call your health care provider if you have any problems or questions   after your procedure. °WHAT TO EXPECT AFTER THE PROCEDURE  °After your procedure: °· You may feel sleepy, clumsy, and have poor balance for several hours. °· Vomiting may occur if you eat too soon after the procedure. °HOME CARE INSTRUCTIONS °· Do not participate in any activities where you could become injured for at least 24 hours. Do not: °¨ Drive. °¨ Swim. °¨ Ride a bicycle. °¨ Operate heavy machinery. °¨ Cook. °¨ Use power tools. °¨ Climb ladders. °¨ Work from a high place. °· Do not make important decisions or sign legal documents until you are improved. °· If you vomit, drink water, juice, or soup when you can drink without vomiting. Make sure you have little or no nausea before eating solid foods. °· Only take over-the-counter or prescription medicines for pain, discomfort, or fever as directed by your health care provider. °· Make sure you and your family fully understand everything about the medicines given to you, including what side effects may occur. °· You should not drink alcohol, take sleeping pills, or take medicines that cause drowsiness for at least 24 hours. °· If you smoke, do not smoke without supervision. °· If you are feeling better, you  may resume normal activities 24 hours after you were sedated. °· Keep all appointments with your health care provider. °SEEK MEDICAL CARE IF: °· Your skin is pale or bluish in color. °· You continue to feel nauseous or vomit. °· Your pain is getting worse and is not helped by medicine. °· You have bleeding or swelling. °· You are still sleepy or feeling clumsy after 24 hours. °SEEK IMMEDIATE MEDICAL CARE IF: °· You develop a rash. °· You have difficulty breathing. °· You develop any type of allergic problem. °· You have a fever. °MAKE SURE YOU: °· Understand these instructions. °· Will watch your condition. °· Will get help right away if you are not doing well or get worse. °  °This information is not intended to replace advice given to you by your health care provider. Make sure you discuss any questions you have with your health care provider. °  °Document Released: 05/11/2013 Document Revised: 08/11/2014 Document Reviewed: 05/11/2013 °Elsevier Interactive Patient Education ©2016 Elsevier Inc. ° °

## 2016-01-01 NOTE — Procedures (Signed)
RIJV PAC SVC RA No comp/EBL 

## 2016-01-03 ENCOUNTER — Other Ambulatory Visit (HOSPITAL_COMMUNITY): Payer: Self-pay | Admitting: Emergency Medicine

## 2016-01-03 MED ORDER — ERGOCALCIFEROL 1.25 MG (50000 UT) PO CAPS: 50000.0000 [IU] | ORAL_CAPSULE | ORAL | Status: DC

## 2016-01-05 ENCOUNTER — Encounter (HOSPITAL_COMMUNITY): Payer: Self-pay | Admitting: *Deleted

## 2016-01-07 ENCOUNTER — Encounter (HOSPITAL_COMMUNITY): Payer: Self-pay | Admitting: Oncology

## 2016-01-07 ENCOUNTER — Encounter (HOSPITAL_BASED_OUTPATIENT_CLINIC_OR_DEPARTMENT_OTHER): Payer: 59 | Admitting: Oncology

## 2016-01-07 ENCOUNTER — Encounter (HOSPITAL_COMMUNITY): Payer: 59 | Attending: Hematology & Oncology

## 2016-01-07 VITALS — BP 126/78 | HR 91 | Temp 98.4°F | Resp 16 | Wt 235.0 lb

## 2016-01-07 VITALS — BP 120/73 | HR 77 | Temp 97.7°F | Resp 20

## 2016-01-07 DIAGNOSIS — R3911 Hesitancy of micturition: Secondary | ICD-10-CM | POA: Diagnosis not present

## 2016-01-07 DIAGNOSIS — I82409 Acute embolism and thrombosis of unspecified deep veins of unspecified lower extremity: Secondary | ICD-10-CM | POA: Insufficient documentation

## 2016-01-07 DIAGNOSIS — R634 Abnormal weight loss: Secondary | ICD-10-CM | POA: Diagnosis not present

## 2016-01-07 DIAGNOSIS — F17210 Nicotine dependence, cigarettes, uncomplicated: Secondary | ICD-10-CM | POA: Diagnosis not present

## 2016-01-07 DIAGNOSIS — R972 Elevated prostate specific antigen [PSA]: Secondary | ICD-10-CM | POA: Diagnosis not present

## 2016-01-07 DIAGNOSIS — C61 Malignant neoplasm of prostate: Secondary | ICD-10-CM | POA: Diagnosis not present

## 2016-01-07 DIAGNOSIS — Z79899 Other long term (current) drug therapy: Secondary | ICD-10-CM | POA: Insufficient documentation

## 2016-01-07 DIAGNOSIS — Z7982 Long term (current) use of aspirin: Secondary | ICD-10-CM | POA: Insufficient documentation

## 2016-01-07 DIAGNOSIS — C7951 Secondary malignant neoplasm of bone: Secondary | ICD-10-CM | POA: Insufficient documentation

## 2016-01-07 DIAGNOSIS — Z5111 Encounter for antineoplastic chemotherapy: Secondary | ICD-10-CM | POA: Diagnosis not present

## 2016-01-07 DIAGNOSIS — M1990 Unspecified osteoarthritis, unspecified site: Secondary | ICD-10-CM | POA: Diagnosis not present

## 2016-01-07 LAB — CBC WITH DIFFERENTIAL/PLATELET
Basophils Absolute: 0 10*3/uL (ref 0.0–0.1)
Basophils Relative: 0 %
Eosinophils Absolute: 0 10*3/uL (ref 0.0–0.7)
Eosinophils Relative: 0 %
HEMATOCRIT: 42.8 % (ref 39.0–52.0)
HEMOGLOBIN: 14.2 g/dL (ref 13.0–17.0)
LYMPHS ABS: 1.1 10*3/uL (ref 0.7–4.0)
LYMPHS PCT: 7 %
MCH: 28.9 pg (ref 26.0–34.0)
MCHC: 33.2 g/dL (ref 30.0–36.0)
MCV: 87.2 fL (ref 78.0–100.0)
MONOS PCT: 3 %
Monocytes Absolute: 0.5 10*3/uL (ref 0.1–1.0)
NEUTROS ABS: 13.9 10*3/uL — AB (ref 1.7–7.7)
NEUTROS PCT: 90 %
Platelets: 248 10*3/uL (ref 150–400)
RBC: 4.91 MIL/uL (ref 4.22–5.81)
RDW: 13.4 % (ref 11.5–15.5)
WBC: 15.5 10*3/uL — ABNORMAL HIGH (ref 4.0–10.5)

## 2016-01-07 LAB — COMPREHENSIVE METABOLIC PANEL
ALK PHOS: 194 U/L — AB (ref 38–126)
ALT: 17 U/L (ref 17–63)
ANION GAP: 9 (ref 5–15)
AST: 19 U/L (ref 15–41)
Albumin: 4 g/dL (ref 3.5–5.0)
BILIRUBIN TOTAL: 0.4 mg/dL (ref 0.3–1.2)
BUN: 16 mg/dL (ref 6–20)
CALCIUM: 9.3 mg/dL (ref 8.9–10.3)
CO2: 24 mmol/L (ref 22–32)
CREATININE: 1.09 mg/dL (ref 0.61–1.24)
Chloride: 103 mmol/L (ref 101–111)
GFR calc non Af Amer: >60 mL/min (ref >60)
GLUCOSE: 171 mg/dL — AB (ref 65–99)
Potassium: 4.4 mmol/L (ref 3.5–5.1)
Sodium: 136 mmol/L (ref 135–145)
TOTAL PROTEIN: 7.4 g/dL (ref 6.5–8.1)

## 2016-01-07 LAB — PSA: PSA: 89.94 ng/mL — ABNORMAL HIGH (ref 0.00–4.00)

## 2016-01-07 MED ORDER — LORAZEPAM 2 MG/ML IJ SOLN
0.5000 mg | Freq: Once | INTRAMUSCULAR | Status: AC
  Administered 2016-01-07: 0.5 mg via INTRAVENOUS

## 2016-01-07 MED ORDER — HEPARIN SOD (PORK) LOCK FLUSH 100 UNIT/ML IV SOLN
500.0000 [IU] | Freq: Once | INTRAVENOUS | Status: AC | PRN
  Administered 2016-01-07: 500 [IU]

## 2016-01-07 MED ORDER — PREDNISONE 5 MG PO TABS: 5.0000 mg | ORAL_TABLET | Freq: Every day | ORAL | Status: DC

## 2016-01-07 MED ORDER — SODIUM CHLORIDE 0.9% FLUSH: 10.0000 mL | INTRAVENOUS | Status: DC | PRN

## 2016-01-07 MED ORDER — DOCETAXEL CHEMO INJECTION 160 MG/16ML
75.0000 mg/m2 | Freq: Once | INTRAVENOUS | Status: AC
  Administered 2016-01-07: 180 mg via INTRAVENOUS
  Filled 2016-01-07: qty 18

## 2016-01-07 MED ORDER — SODIUM CHLORIDE 0.9 % IV SOLN
10.0000 mg | Freq: Once | INTRAVENOUS | Status: DC
  Filled 2016-01-07: qty 1

## 2016-01-07 MED ORDER — LORAZEPAM 2 MG/ML IJ SOLN
INTRAMUSCULAR | Status: AC
  Filled 2016-01-07: qty 1

## 2016-01-07 MED ORDER — PEGFILGRASTIM 6 MG/0.6ML ~~LOC~~ PSKT
PREFILLED_SYRINGE | SUBCUTANEOUS | Status: AC
  Filled 2016-01-07: qty 0.6

## 2016-01-07 MED ORDER — SODIUM CHLORIDE 0.9 % IV SOLN
Freq: Once | INTRAVENOUS | Status: DC
  Filled 2016-01-07: qty 4

## 2016-01-07 MED ORDER — ONDANSETRON HCL 40 MG/20ML IJ SOLN
Freq: Once | INTRAMUSCULAR | Status: AC
  Administered 2016-01-07: 8 mg via INTRAVENOUS
  Filled 2016-01-07: qty 4

## 2016-01-07 MED ORDER — PEGFILGRASTIM 6 MG/0.6ML ~~LOC~~ PSKT
6.0000 mg | PREFILLED_SYRINGE | Freq: Once | SUBCUTANEOUS | Status: AC
  Administered 2016-01-07: 6 mg via SUBCUTANEOUS

## 2016-01-07 MED ORDER — SODIUM CHLORIDE 0.9 % IV SOLN
Freq: Once | INTRAVENOUS | Status: AC
  Administered 2016-01-07: 11:00:00 via INTRAVENOUS

## 2016-01-07 NOTE — Patient Instructions (Signed)
[facility] Discharge Instructions for Patients Receiving Chemotherapy   Beginning January 23rd 2017 lab work for the [facility] will be done in the  Main lab at [facility] on 1st floor. If you have a lab appointment with the [place] please come in thru the  Main Entrance and check in at the main information desk   Today you received the following chemotherapy agents taxotere   Docetaxel injection What is this medicine? DOCETAXEL (doe se TAX el) is a chemotherapy drug. It targets fast dividing cells, like cancer cells, and causes these cells to die. This medicine is used to treat many types of cancers like breast cancer, certain stomach cancers, head and neck cancer, lung cancer, and prostate cancer. This medicine may be used for other purposes; ask your health care provider or pharmacist if you have questions. What should I tell my health care provider before I take this medicine? They need to know if you have any of these conditions: -infection (especially a virus infection such as chickenpox, cold sores, or herpes) -liver disease -low blood counts, like low white cell, platelet, or red cell counts -an unusual or allergic reaction to docetaxel, polysorbate 80, other chemotherapy agents, other medicines, foods, dyes, or preservatives -pregnant or trying to get pregnant -breast-feeding How should I use this medicine? This drug is given as an infusion into a vein. It is administered in a hospital or clinic by a specially trained health care professional. Talk to your pediatrician regarding the use of this medicine in children. Special care may be needed. Overdosage: If you think you have taken too much of this medicine contact a poison control center or emergency room at once. NOTE: This medicine is only for you. Do not share this medicine with others. What if I miss a dose? It is important not to miss your dose. Call your doctor or health care professional if you  are unable to keep an appointment. What may interact with this medicine? -cyclosporine -erythromycin -ketoconazole -medicines to increase blood counts like filgrastim, pegfilgrastim, sargramostim -vaccines Talk to your doctor or health care professional before taking any of these medicines: -acetaminophen -aspirin -ibuprofen -ketoprofen -naproxen This list may not describe all possible interactions. Give your health care provider a list of all the medicines, herbs, non-prescription drugs, or dietary supplements you use. Also tell them if you smoke, drink alcohol, or use illegal drugs. Some items may interact with your medicine. What should I watch for while using this medicine? Your condition will be monitored carefully while you are receiving this medicine. You will need important blood work done while you are taking this medicine. This drug may make you feel generally unwell. This is not uncommon, as chemotherapy can affect healthy cells as well as cancer cells. Report any side effects. Continue your course of treatment even though you feel ill unless your doctor tells you to stop. In some cases, you may be given additional medicines to help with side effects. Follow all directions for their use. Call your doctor or health care professional for advice if you get a fever, chills or sore throat, or other symptoms of a cold or flu. Do not treat yourself. This drug decreases your body's ability to fight infections. Try to avoid being around people who are sick. This medicine may increase your risk to bruise or bleed. Call your doctor or health care professional if you notice any unusual bleeding. This medicine may contain alcohol in the product. You may get  drowsy or dizzy. Do not drive, use machinery, or do anything that needs mental alertness until you know how this medicine affects you. Do not stand or sit up quickly, especially if you are an older patient. This reduces the risk of dizzy or  fainting spells. Avoid alcoholic drinks. Do not become pregnant while taking this medicine. Women should inform their doctor if they wish to become pregnant or think they might be pregnant. There is a potential for serious side effects to an unborn child. Talk to your health care professional or pharmacist for more information. Do not breast-feed an infant while taking this medicine. What side effects may I notice from receiving this medicine? Side effects that you should report to your doctor or health care professional as soon as possible: -allergic reactions like skin rash, itching or hives, swelling of the face, lips, or tongue -low blood counts - This drug may decrease the number of white blood cells, red blood cells and platelets. You may be at increased risk for infections and bleeding. -signs of infection - fever or chills, cough, sore throat, pain or difficulty passing urine -signs of decreased platelets or bleeding - bruising, pinpoint red spots on the skin, black, tarry stools, nosebleeds -signs of decreased red blood cells - unusually weak or tired, fainting spells, lightheadedness -breathing problems -fast or irregular heartbeat -low blood pressure -mouth sores -nausea and vomiting -pain, swelling, redness or irritation at the injection site -pain, tingling, numbness in the hands or feet -swelling of the ankle, feet, hands -weight gain Side effects that usually do not require medical attention (report to your prescriber or health care professional if they continue or are bothersome): -bone pain -complete hair loss including hair on your head, underarms, pubic hair, eyebrows, and eyelashes -diarrhea -excessive tearing -changes in the color of fingernails -loosening of the fingernails -nausea -muscle pain -red flush to skin -sweating -weak or tired This list may not describe all possible side effects. Call your doctor for medical advice about side effects. You may report side  effects to FDA at 1-800-FDA-1088. Where should I keep my medicine? This drug is given in a hospital or clinic and will not be stored at home. NOTE: This sheet is a summary. It may not cover all possible information. If you have questions about this medicine, talk to your doctor, pharmacist, or health care provider.    2016, Elsevier/Gold Standard. (2014-08-07 16:04:57)   To help prevent nausea and vomiting after your treatment, we encourage you to take your nausea medication  If you develop nausea and vomiting, or diarrhea that is not controlled by your medication, call the clinic.  The clinic phone number is [phone]. Office hours are Monday-Friday 8:30am-5:00pm.  BELOW ARE SYMPTOMS THAT SHOULD BE REPORTED IMMEDIATELY:  *FEVER GREATER THAN 101.0 F  *CHILLS WITH OR WITHOUT FEVER  NAUSEA AND VOMITING THAT IS NOT CONTROLLED WITH YOUR NAUSEA MEDICATION  *UNUSUAL SHORTNESS OF BREATH  *UNUSUAL BRUISING OR BLEEDING  TENDERNESS IN MOUTH AND THROAT WITH OR WITHOUT PRESENCE OF ULCERS  *URINARY PROBLEMS  *BOWEL PROBLEMS  UNUSUAL RASH Items with * indicate a potential emergency and should be followed up as soon as possible. If you have an emergency after office hours please contact your primary care physician or go to the nearest emergency department.  Please call the clinic during office hours if you have any questions or concerns.   You may also contact the Patient Navigator at [phone] should you have any questions or need  assistance in obtaining follow up care.      Resources For Cancer Patients and their Caregivers ? American Cancer Society: Can assist with transportation, wigs, general needs, runs Look Good Feel Better.        ([phone] ? Cancer Care: Provides financial assistance, online support groups, medication/co-pay assistance.  1-800-813-HOPE 567-593-4186) ? [facility] Assists [place] cancer patients and their families  through emotional , educational and financial support.  217-396-1239 ? [place] DSS Where to apply for food stamps, Medicaid and utility assistance. (302) 059-0817 ? [facility]: Transportation to medical appointments. 757 669 3880 ? Social Security Administration: May apply for disability if have a Stage IV cancer. 4074298151 812-057-2668 ? [employer], Disability and Transit Services: Assists with nutrition, care and transit needs. ([phone]

## 2016-01-07 NOTE — Patient Instructions (Signed)
[place] at [facility] Discharge Instructions  RECOMMENDATIONS MADE BY THE CONSULTANT AND ANY TEST RESULTS WILL BE SENT TO YOUR REFERRING PHYSICIAN.  You were seen by Darcella Gasman, PA. He has called in your prednisone prescription to your pharmacy. Start this medication after the dexamethasone. Return as scheduled for your follow-up visit on 01/14/16 with Dr. Whitney Muse. Keep your dental appointment as scheduled. Call the office with any questions or concerns.   Thank you for choosing [place] at [facility] to provide your oncology and hematology care.  To afford each patient quality time with our provider, please arrive at least 15 minutes before your scheduled appointment time.   Beginning January 23rd 2017 lab work for the [facility] will be done in the  Main lab at [facility] on 1st floor. If you have a lab appointment with the [facility] please come in thru the  Main Entrance and check in at the main information desk  You need to re-schedule your appointment should you arrive 10 or more minutes late.  We strive to give you quality time with our providers, and arriving late affects you and other patients whose appointments are after yours.  Also, if you no show three or more times for appointments you may be dismissed from the clinic at the providers discretion.     Again, thank you for choosing [facility].  Our hope is that these requests will decrease the amount of time that you wait before being seen by our physicians.       _____________________________________________________________  Should you have questions after your visit to [facility], please contact our office at [phone] between the hours of 8:30 a.m. and 4:30 p.m.  Voicemails left after 4:30 p.m. will not be returned until the following business day.  For prescription refill requests, have your pharmacy contact our office.          Resources For Cancer Patients and their Caregivers ? American Cancer Society: Can assist with transportation, wigs, general needs, runs Look Good Feel Better.        707-177-0366 ? Cancer Care: Provides financial assistance, online support groups, medication/co-pay assistance.  1-800-813-HOPE 236-422-9162) ? [facility] [place] Assists [place] cancer patients and their families through emotional , educational and financial support.  (984)604-6630 ? [place] DSS Where to apply for food stamps, Medicaid and utility assistance. 7160858609 ? [facility]: Transportation to medical appointments. (534)452-2362 ? Social Security Administration: May apply for disability if have a Stage IV cancer. 806-167-5214 (618)043-8436 ? [employer], Disability and Transit Services: Assists with nutrition, care and transit needs. [place] Support Programs: @10RELATIVEDAYS @ > Cancer Support Group  2nd Tuesday of the month 1pm-2pm, Journey Room  > Creative Journey  3rd Tuesday of the month 1130am-1pm, Journey Room  > Look Good Feel Better  1st Wednesday of the month 10am-12 noon, Journey Room (Call [place] to register [phone])

## 2016-01-07 NOTE — Assessment & Plan Note (Addendum)
Metastatic prostate adenocarcinoma, to bone.  Began [place] on 12/28/2015.  Oncology history is updated.  Staging in [facility] problem list completed.  Oncology Flowsheet 12/28/2015  degarelix (FIRMAGON) [place] 240 mg   [name] is planned to begin on 01/25/2016.  He is to continue with Ca++ and Vit D. He has a dental appointment tomorrow, 01/08/2016 for dental exam and clearance for Xgeva treatment.  Firmagon dose will be reduced to 80 mg monthly moving forward per protocol.  Will transition to Depo-[name] in the future.  Current pain regimen: PO Hydrocodone 2/day.  He did not start Fentanyl due to significant pain improvement following Firmagon injection.  Pre-treatment labs today as ordered: CBC diff, CMET, PSA.  Ativan was added to treatment plan for chemotherapy-induced nausea prevention and anxiety.  Rx for Prednisone 5 mg BID escribed with his Docetaxel regimen.  Cancer policy information is completed.  Patient's daughter given original.  Copy is made and will be faxed accordingly while keeping a copy for [facility] scanning.  Return as scheduled for follow-up.

## 2016-01-07 NOTE — Progress Notes (Signed)
Matthew [name], [place] [address]  Prostate carcinoma [facility]) - Plan: CBC with Differential, Comprehensive metabolic panel, PSA, predniSONE (DELTASONE) 5 MG tablet  CURRENT THERAPY: Firmagon beginning on 12/28/2015.  Docetaxel every 21 days beginning on 01/07/2016.  Xgeva to begin in 01/25/2016.  INTERVAL HISTORY: Matthew [name] 60 y.o. 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    Prostate carcinoma ([place])   12/10/2015 Imaging CTA chest, multiple thoracic and rib osseous lesions. no primary evident. Atherosclerosis including aortic and CAD   12/12/2015 Imaging CT abdomen/pelvis with sclerotic osseous lesions througout the lumbar spine, sacrum, bilateral iliac bones and L proximal femur worrisome for sclerotic osseous mets,mild prostatomegaly, no LAD, Infrarenal 3.8 cm AAA   12/12/2015 Tumor Marker PSA 153.85   12/18/2015 Imaging Bone Scan Diffuse bony metastatic disease, posterior calvarium, sternum, thoracic, lumbar spine, bilateral ribs, L shoulder, bilateral bony pelvis, proximal L femur   12/24/2015 Initial Biopsy CT biopsy of L iliac bone lesion   12/26/2015 Pathology Results Bone, biopsy, left iliac - POSITIVE FOR METASTATIC ADENOCARCINOMA.   12/28/2015 -  Chemotherapy Firmagon instituted 240 mg    01/01/2016 Procedure Port placed by IR.   01/07/2016 -  Chemotherapy Docetaxel every 21 days   I personally reviewed and went over radiographic studies with the patient.  The results are noted within this dictation.  Labs will be updated today.  Today, he will start docetaxel systemic chemotherapy. He is a little anxious about this. We reviewed the risks, benefits, alternatives, and side effects of docetaxel chemotherapy. He is undergone chemotherapy teaching as well. He is ready.  His pain is significantly improved following firmagon injection. As a matter of fact, he continues to use  hydrocodone for pain management taking a prostate 2 tablets per day. He never started that due to significant pain improvement approximately 12-24 hours post Firmagon injection.  "How are my bones?"  I reviewed his bone scan in addition to femur x-rays. His real question is regarding density above. He knows that I do not have a bone density exam at this time but moving forward, we will ascertain on following completion of systemic chemotherapy while on androgen deprivation therapy.  He notes that he has a dental appointment tomorrow, 01/08/2016 for clearance to start Xgeva.   Review of Systems  Constitutional: Positive for malaise/fatigue. Negative for fever, chills and weight loss.  HENT: Negative.   Eyes: Negative.  Negative for blurred vision and double vision.  Respiratory: Negative.   Cardiovascular: Negative.   Gastrointestinal: Negative.   Genitourinary: Negative.   Musculoskeletal: Negative.   Skin: Negative.   Neurological: Negative.  Negative for headaches.  Endo/Heme/Allergies: Negative.   Psychiatric/Behavioral: Negative.     Past Medical History  Diagnosis Date  . DVT (deep venous thrombosis) ([place])   . Arthritis   . Cancer [facility])     Prostate  . Kidney stones   . Prostate carcinoma ([place]) 12/26/2015  . Bone metastases ([place]) 12/26/2015    Past Surgical History  Procedure Laterality Date  . Circumcision  30 years ago    History reviewed. No pertinent family history.  Social History   Social History  . Marital Status: Single    Spouse Name: N/A  . Number of Children: N/A  . Years of Education: N/A   Social History Main Topics  . Smoking status: Current Every Day Smoker -- 1.00 packs/day  Types: Cigarettes  . Smokeless tobacco: None  . Alcohol Use: Yes     Comment: occ  . Drug Use: No  . Sexual Activity: Not Asked   Other Topics Concern  . None   Social History Narrative     PHYSICAL EXAMINATION  ECOG PERFORMANCE STATUS: 1 - Symptomatic but  completely ambulatory  Filed Vitals:   01/07/16 1003  BP: 126/78  Pulse: 91  Temp: 98.4 F (36.9 C)  Resp: 16    GENERAL:alert, no distress, well nourished, well developed, comfortable, cooperative, obese, smiling and Accompanied by his daughter SKIN: skin color, texture, turgor are normal, no rashes or significant lesions HEAD: Normocephalic, No masses, lesions, tenderness or abnormalities EYES: normal, Conjunctiva are pink and non-injected EARS: External ears normal OROPHARYNX:lips, buccal mucosa, and tongue normal and mucous membranes are moist  NECK: supple, trachea midline LYMPH:  no palpable lymphadenopathy BREAST:not examined LUNGS: clear to auscultation  HEART: regular rate & rhythm, no murmurs, no gallops, S1 normal and S2 normal ABDOMEN:abdomen soft, non-tender, obese and normal bowel sounds BACK: Back symmetric, no curvature. EXTREMITIES:less then 2 second capillary refill, no joint deformities, effusion, or inflammation, no skin discoloration, no cyanosis  NEURO: alert & oriented x 3 with fluent speech, no focal motor/sensory deficits, gait normal   LABORATORY DATA: CBC    Component Value Date/Time   WBC 15.5* 01/07/2016 1021   RBC 4.91 01/07/2016 1021   HGB 14.2 01/07/2016 1021   HCT 42.8 01/07/2016 1021   PLT 248 01/07/2016 1021   MCV 87.2 01/07/2016 1021   MCH 28.9 01/07/2016 1021   MCHC 33.2 01/07/2016 1021   RDW 13.4 01/07/2016 1021   LYMPHSABS 1.1 01/07/2016 1021   MONOABS 0.5 01/07/2016 1021   EOSABS 0.0 01/07/2016 1021   BASOSABS 0.0 01/07/2016 1021      Chemistry      Component Value Date/Time   NA 136 01/07/2016 1021   K 4.4 01/07/2016 1021   CL 103 01/07/2016 1021   CO2 24 01/07/2016 1021   BUN 16 01/07/2016 1021   CREATININE 1.09 01/07/2016 1021      Component Value Date/Time   CALCIUM 9.3 01/07/2016 1021   ALKPHOS 194* 01/07/2016 1021   AST 19 01/07/2016 1021   ALT 17 01/07/2016 1021   BILITOT 0.4 01/07/2016 1021      Lab  Results  Component Value Date   PSA 159.00* 12/28/2015   PSA 153.85* 12/12/2015     PENDING LABS:   RADIOGRAPHIC STUDIES:  Dg Chest 2 View  12/10/2015  CLINICAL DATA:  Chest pressure for 5 days with progression while at work last night. Increasing shortness of breath. Pain is worse with movement. EXAM: CHEST - 2 VIEW COMPARISON:  One-view chest x-ray 11/05/2006 FINDINGS: The heart size is normal. Emphysematous changes are again noted. Left upper lobe airspace opacity is most consistent with pneumonia. Multilevel degenerative changes are present in the thoracic spine. IMPRESSION: 1. Left upper lobe airspace disease compatible with pneumonia. 2. Emphysema. Electronically Signed   By: San Morelle M.D.   On: 12/10/2015 08:01   Ct Angio Chest Pe W/cm &/or Wo Cm  12/10/2015  CLINICAL DATA:  PT states center chest pressure x5 days and worsening last night while at work with SOB. PT states pain worse with movement EXAM: CT ANGIOGRAPHY CHEST WITH CONTRAST TECHNIQUE: Multidetector CT imaging of the chest was performed using the standard protocol during bolus administration of intravenous contrast. Multiplanar CT image reconstructions and MIPs were obtained to evaluate  the vascular anatomy. CONTRAST:  100 mL Isovue 370 IV COMPARISON:  None. FINDINGS: Vascular: Left arm IV contrast injection. Innominate vein and SVC patent. Right atrium and right ventricle are nondilated. Satisfactory opacification of pulmonary arteries noted, and there is no evidence of pulmonary emboli. Patent bilateral pulmonary veins drain into left atrium. Scattered coronary calcifications. Adequate contrast opacification of the thoracic aorta with no evidence of dissection, aneurysm, or stenosis. There is classic 3-vessel brachiocephalic arch anatomy without proximal stenosis. Mild eccentric nonocclusive plaque in the descending thoracic aorta. Mediastinum/Lymph Nodes: No masses or pathologically enlarged lymph nodes identified. No  pericardial effusion. Lungs/Pleura: No pneumothorax. No pulmonary mass, infiltrate, or effusion.Mild posterior dependent subsegmental atelectasis or scarring in the posterior left upper lobe and bilateral lower lobes. Upper abdomen: No acute findings. Musculoskeletal: Mixed sclerotic/lytic lesions in multiple upper and mid thoracic vertebral bodies, with involvement of posterior elements extensively at T2, T3, and T4. Negative for pathologic fracture. Sclerotic lesions in left fourth, fifth, and sixth ribs. Subcentimeter sclerotic foci in the manubrium. Review of the MIP images confirms the above findings. IMPRESSION: 1. Negative for acute PE or thoracic aortic dissection. 2. Multiple thoracic and rib osseous lesions suggesting osseous metastatic disease. No primary evident. Follow-up recommended. These results will be called to the ordering clinician or representative by the Radiologist Assistant, and communication documented in the PACS or zVision Dashboard. 3. Atherosclerosis, including aortic and coronary artery disease. Please note that although the presence of coronary artery calcium documents the presence of coronary artery disease, the severity of this disease and any potential stenosis cannot be assessed on this non-gated CT examination. Assessment for potential risk factor modification, dietary therapy or pharmacologic therapy may be warranted, if clinically indicated. Electronically Signed   By: Lucrezia Europe M.D.   On: 12/10/2015 10:23   Nm Bone Scan Whole Body  12/18/2015  CLINICAL DATA:  60 year old male with sclerotic bony lesions identified on recent chest abdomen and pelvis CT. Elevated PSA EXAM: NUCLEAR MEDICINE WHOLE BODY BONE SCAN TECHNIQUE: Whole body anterior and posterior images were obtained approximately 3 hours after intravenous injection of radiopharmaceutical. RADIOPHARMACEUTICALS:  25.6 mCi Technetium-11mMDP IV COMPARISON:  12/12/2015 CT FINDINGS: Areas of increased focal activity  within the posterior calvarium, sternum, thoracic and lumbar spine, bilateral ribs, left shoulder, bilateral bony pelvis, and proximal left femur are compatible with bony metastatic disease. Degenerative changes within both knees noted, left-greater-than-right. IMPRESSION: Diffuse bony metastatic disease as described. Electronically Signed   By: JMargarette CanadaM.D.   On: 12/18/2015 14:57   Ct Abdomen Pelvis W Contrast  12/12/2015  CLINICAL DATA:  Numerous sclerotic lesions throughout the thoracic skeleton of unclear etiology on recent chest CT angiogram. EXAM: CT ABDOMEN AND PELVIS WITH CONTRAST TECHNIQUE: Multidetector CT imaging of the abdomen and pelvis was performed using the standard protocol following bolus administration of intravenous contrast. CONTRAST:  1014mISOVUE-300 IOPAMIDOL (ISOVUE-300) INJECTION 61% COMPARISON:  12/10/2015 chest CT.  No prior CT abdomen/pelvis. FINDINGS: Lower chest: No significant pulmonary nodules or acute consolidative airspace disease. Hepatobiliary: Normal liver with no liver mass. Normal gallbladder with no radiopaque cholelithiasis. No biliary ductal dilatation. Pancreas: Normal, with no mass or duct dilation. Spleen: Normal size. No mass. Adrenals/Urinary Tract: Normal adrenals. There are subjacent simple cysts in the lower right kidney measuring 3.5 cm anteriorly and 6.3 cm posteriorly. There are additional subcentimeter hypodense renal cortical lesions in both kidneys, too small to characterize. No hydronephrosis. Normal bladder. Stomach/Bowel: Grossly normal stomach. Normal caliber small bowel with  no small bowel wall thickening. Normal appendix. Normal large bowel with no diverticulosis, large bowel wall thickening or pericolonic fat stranding. Vascular/Lymphatic: Atherosclerotic abdominal aorta with a 3.8 cm infrarenal abdominal aortic aneurysm. Patent portal, splenic, hepatic and renal veins. No pathologically enlarged lymph nodes in the abdomen or pelvis.  Reproductive: Prostate is mildly enlarged with prostate volume 34 cc (3.9 x 3.4 x 4.9 cm). Nonspecific coarse internal prostatic calcifications. Seminal vesicles appear symmetric and normal in size. Other: No pneumoperitoneum, ascites or focal fluid collection. Musculoskeletal: Sclerotic osseous lesions of various size throughout the lumbar spine, sacrum, bilateral iliac bones, left ischium and left proximal femoral metaphysis. IMPRESSION: 1. Sclerotic osseous lesions throughout the lumbar spine, sacrum, bilateral iliac bones and left proximal femur worrisome for sclerotic osseous metastases. Mild prostatomegaly. Recommend correlation with PSA level. 2. No lymphadenopathy or other findings of metastatic disease in the abdomen or pelvis. 3. Infrarenal 3.8 cm abdominal aortic aneurysm. Recommend followup by ultrasound in 2 years. This recommendation follows ACR consensus guidelines: White Paper of the ACR Incidental Findings Committee II on Vascular Findings. J Am Coll Radiol 2013; 10:789-794. Electronically Signed   By: Ilona Sorrel M.D.   On: 12/12/2015 09:54   Ir Fluoro Guide Cv Line Right  01/01/2016  INDICATION: Cancer EXAM: IMPLANTED PORT A CATH PLACEMENT WITH ULTRASOUND AND FLUOROSCOPIC GUIDANCE MEDICATIONS: ; The antibiotic was administered within an appropriate time interval prior to skin puncture. ANESTHESIA/SEDATION: Moderate (conscious) sedation was employed during this procedure. A total of Versed 3.5 mg and Fentanyl 100 mcg was administered intravenously. Moderate Sedation Time: 27 minutes. The patient's level of consciousness and vital signs were monitored continuously by radiology nursing throughout the procedure under my direct supervision. FLUOROSCOPY TIME:  minutes, 24 seconds (50.3 mGy) COMPLICATIONS: None immediate. PROCEDURE: The procedure, risks, benefits, and alternatives were explained to the patient. Questions regarding the procedure were encouraged and answered. The patient understands  and consents to the procedure. The right neck and chest were prepped with chlorhexidine in a sterile fashion, and a sterile drape was applied covering the operative field. Maximum barrier sterile technique with sterile gowns and gloves were used for the procedure. A timeout was performed prior to the initiation of the procedure. Local anesthesia was provided with 1% lidocaine with epinephrine. After creating a small venotomy incision, a micropuncture kit was utilized to access the internal jugular vein under direct, real-time ultrasound guidance. Ultrasound image documentation was performed. The microwire was kinked to measure appropriate catheter length. A subcutaneous port pocket was then created along the upper chest wall utilizing a combination of sharp and blunt dissection. The pocket was irrigated with sterile saline. A single lumen ISP power injectable port was chosen for placement. The 8 Fr catheter was tunneled from the port pocket site to the venotomy incision. The port was placed in the pocket. 3-0 Ethilon stitches were utilized to secure the port into the pocket. The external catheter was trimmed to appropriate length. At the venotomy, an 8 Fr peel-away sheath was placed over a guidewire under fluoroscopic guidance. The catheter was then placed through the sheath and the sheath was removed. Final catheter positioning was confirmed and documented with a fluoroscopic spot radiograph. The port was accessed with a Huber needle, aspirated and flushed with heparinized saline. The venotomy site was closed with an interrupted 4-0 Vicryl suture. The port pocket incision was closed with interrupted 2-0 Vicryl suture and the skin was opposed with a running subcuticular 4-0 Vicryl suture. Dermabond and Steri-strips were applied to  both incisions. Dressings were placed. The patient tolerated the procedure well without immediate post procedural complication. IMPRESSION: Successful placement of a right internal jugular  approach power injectable Port-A-Cath. The catheter is ready for immediate use. Electronically Signed   By: Marybelle Killings M.D.   On: 01/01/2016 17:24   Ir US Guide Vasc Access Right  01/01/2016  INDICATION: Cancer EXAM: IMPLANTED PORT A CATH PLACEMENT WITH ULTRASOUND AND FLUOROSCOPIC GUIDANCE MEDICATIONS: ; The antibiotic was administered within an appropriate time interval prior to skin puncture. ANESTHESIA/SEDATION: Moderate (conscious) sedation was employed during this procedure. A total of Versed 3.5 mg and Fentanyl 100 mcg was administered intravenously. Moderate Sedation Time: 27 minutes. The patient's level of consciousness and vital signs were monitored continuously by radiology nursing throughout the procedure under my direct supervision. FLUOROSCOPY TIME:  minutes, 24 seconds (33.8 mGy) COMPLICATIONS: None immediate. PROCEDURE: The procedure, risks, benefits, and alternatives were explained to the patient. Questions regarding the procedure were encouraged and answered. The patient understands and consents to the procedure. The right neck and chest were prepped with chlorhexidine in a sterile fashion, and a sterile drape was applied covering the operative field. Maximum barrier sterile technique with sterile gowns and gloves were used for the procedure. A timeout was performed prior to the initiation of the procedure. Local anesthesia was provided with 1% lidocaine with epinephrine. After creating a small venotomy incision, a micropuncture kit was utilized to access the internal jugular vein under direct, real-time ultrasound guidance. Ultrasound image documentation was performed. The microwire was kinked to measure appropriate catheter length. A subcutaneous port pocket was then created along the upper chest wall utilizing a combination of sharp and blunt dissection. The pocket was irrigated with sterile saline. A single lumen ISP power injectable port was chosen for placement. The 8 Fr catheter was  tunneled from the port pocket site to the venotomy incision. The port was placed in the pocket. 3-0 Ethilon stitches were utilized to secure the port into the pocket. The external catheter was trimmed to appropriate length. At the venotomy, an 8 Fr peel-away sheath was placed over a guidewire under fluoroscopic guidance. The catheter was then placed through the sheath and the sheath was removed. Final catheter positioning was confirmed and documented with a fluoroscopic spot radiograph. The port was accessed with a Huber needle, aspirated and flushed with heparinized saline. The venotomy site was closed with an interrupted 4-0 Vicryl suture. The port pocket incision was closed with interrupted 2-0 Vicryl suture and the skin was opposed with a running subcuticular 4-0 Vicryl suture. Dermabond and Steri-strips were applied to both incisions. Dressings were placed. The patient tolerated the procedure well without immediate post procedural complication. IMPRESSION: Successful placement of a right internal jugular approach power injectable Port-A-Cath. The catheter is ready for immediate use. Electronically Signed   By: Marybelle Killings M.D.   On: 01/01/2016 17:24   Ct Biopsy  12/24/2015  CLINICAL DATA:  Multiple sclerotic osseous metastases EXAM: CT GUIDED core BIOPSY OF left iliac bone lesion ANESTHESIA/SEDATION: Intravenous Fentanyl and Versed were administered as conscious sedation during continuous monitoring of the patient's level of consciousness and physiological / cardiorespiratory status by the radiology RN, with a total moderate sedation time of 13 minutes. PROCEDURE: The procedure risks, benefits, and alternatives were explained to the patient. Questions regarding the procedure were encouraged and answered. The patient understands and consents to the procedure. Patient placed prone. Select axial scans through the pelvis obtained and a representative left iliac sclerotic lesion  was identified, and an  appropriate skin entry site determined and marked. The operative field was prepped with chlorhexidinein a sterile fashion, and a sterile drape was applied covering the operative field. A sterile gown and sterile gloves were used for the procedure. Local anesthesia was provided with 1% Lidocaine. Under CT fluoroscopic guidance, 11 gauge OnControl needle was advanced to the margin of the lesion. Core biopsy sample obtained , submitted in formalin to surgical pathology. Postprocedure scan shows no hemorrhage or other apparent complication. The patient tolerated the procedure well. COMPLICATIONS: None immediate FINDINGS: Sclerotic sacral and bilateral iliac bone lesions were identified. CT-guided core biopsy of a representative left iliac lesion obtained as above. IMPRESSION: 1. Technically successful CT-guided core biopsy of left iliac bone lesion. Electronically Signed   By: Lucrezia Europe M.D.   On: 12/24/2015 10:28   Dg Femur Min 2 Views Left  12/28/2015  CLINICAL DATA:  Prostate cancer metastatic to bone. EXAM: LEFT FEMUR 2 VIEWS COMPARISON:  Nuclear medicine whole-body bone scan 12/18/2015. Plain films 09/04/2014. FINDINGS: No acute bony abnormality. No focal bony lesion visualized. No abnormality seen by plain film to correspond to the area of abnormality within the proximal left femur on prior bone scan. IMPRESSION: No acute bony abnormality. Electronically Signed   By: Rolm Baptise M.D.   On: 12/28/2015 11:22     PATHOLOGY:    ASSESSMENT AND PLAN:  Prostate carcinoma ([place]) Metastatic prostate adenocarcinoma, to bone.  Began [place] on 12/28/2015.  Oncology history is updated.  Staging in [facility] problem list completed.  Oncology Flowsheet 12/28/2015  degarelix (FIRMAGON) [place] 240 mg   [name] is planned to begin on 01/25/2016.  He is to continue with Ca++ and Vit D. He has a dental appointment tomorrow, 01/08/2016 for dental exam and clearance for Xgeva treatment.  Firmagon dose will be reduced to 80 mg  monthly moving forward per protocol.  Will transition to Depo-[name] in the future.  Current pain regimen: PO Hydrocodone 2/day.  He did not start Fentanyl due to significant pain improvement following Firmagon injection.  Pre-treatment labs today as ordered: CBC diff, CMET, PSA.  Ativan was added to treatment plan for chemotherapy-induced nausea prevention and anxiety.  Rx for Prednisone 5 mg BID escribed with his Docetaxel regimen.  Cancer policy information is completed.  Patient's daughter given original.  Copy is made and will be faxed accordingly while keeping a copy for [facility] scanning.  Return as scheduled for follow-up.    ORDERS PLACED FOR THIS ENCOUNTER: Orders Placed This Encounter  Procedures  . CBC with Differential  . Comprehensive metabolic panel  . PSA    MEDICATIONS PRESCRIBED THIS ENCOUNTER: Meds ordered this encounter  Medications  . predniSONE (DELTASONE) 5 MG tablet    Sig: Take 1 tablet (5 mg total) by mouth daily with breakfast.    Dispense:  60 tablet    Refill:  1    Order Specific Question:  Supervising Provider    Answer:  [name] [id number]    THERAPY PLAN:  Continue palliative treatment as outlined above with monthly Firmagon and beginning systemic chemotherapy consisting of Docetaxel for a planned 6 cycles.  Xgeva to reduce the risk of SRE to begin in 3 weeks following dental evaluation and clearance.  All questions were answered. The patient knows to call the clinic with any problems, questions or concerns. We can certainly see the patient much sooner if necessary.  Patient and plan discussed with Dr. Ancil Linsey and she is  in agreement with the aforementioned.   This note is electronically signed by: Doy Mince 01/07/2016 12:05 PM

## 2016-01-07 NOTE — Progress Notes (Signed)
Matthew [name] chemotherapy well today Discharged ambulatory   .Marland KitchenNorton Pastel Hallpresents today for neulasta OBI placement per MD orders. OBI device filled per protocol and placed on left Upper Arm. Needle/catheter placement noted prior to patient leaving. [name] without incident and aware of injection to be delivered in  27 hours.

## 2016-01-08 ENCOUNTER — Encounter (HOSPITAL_COMMUNITY): Payer: Self-pay | Admitting: *Deleted

## 2016-01-08 ENCOUNTER — Encounter: Payer: Self-pay | Admitting: *Deleted

## 2016-01-08 NOTE — Progress Notes (Signed)
[facility] Psychosocial Distress Screening Clinical Social Work  Clinical Social Work was referred by distress screening protocol.  The patient scored a 10 on the Psychosocial Distress Thermometer which indicates severe distress. Clinical Social Worker phoned pt to assess for distress and other psychosocial needs. CSW introduced self and explained role of CSW, resources to assist and explained about support programs. Pt reports to be coping well currently. His biggest concern is finances, per his report. He reports he is applying for medicaid or medicare. He could not remember which one, but he shared financial concerns related to insurance. He reports to have his [employer] through work for three months, but is concerned about future coverage. Pt reports he will try to determine which resource he applied for.  CSW explained they do very different things. CSW educated Pt about additional resource of financial advocate. Pt reports his pain was addressed by the medical team and his anxiety has decreased now that he has a treatment plan in place. Pt was encouraged to reach out as needed.   ONCBCN DISTRESS SCREENING 01/07/2016  Screening Type Initial Screening  Distress experienced in past week (1-10) 10  Practical problem type Work/school  Emotional problem type Adjusting to illness;Nervousness/Anxiety  Physical Problem type Pain  Physician notified of physical symptoms Yes  Referral to clinical social work Yes    Clinical Social Worker follow up needed: No.  If yes, follow up plan: Loren Racer, [place] Tuesdays   [phone]

## 2016-01-08 NOTE — Progress Notes (Signed)
Pt called for 24 hour f/u post first tx.  Message left for pt to contact the clinic with any questions or concerns.

## 2016-01-14 ENCOUNTER — Encounter (HOSPITAL_BASED_OUTPATIENT_CLINIC_OR_DEPARTMENT_OTHER): Payer: 59 | Admitting: Hematology & Oncology

## 2016-01-14 ENCOUNTER — Encounter (HOSPITAL_COMMUNITY): Payer: Self-pay | Admitting: Hematology & Oncology

## 2016-01-14 VITALS — BP 118/72 | HR 101 | Temp 97.0°F | Resp 16 | Wt 235.0 lb

## 2016-01-14 DIAGNOSIS — C61 Malignant neoplasm of prostate: Secondary | ICD-10-CM

## 2016-01-14 DIAGNOSIS — C7951 Secondary malignant neoplasm of bone: Secondary | ICD-10-CM

## 2016-01-14 DIAGNOSIS — G893 Neoplasm related pain (acute) (chronic): Secondary | ICD-10-CM

## 2016-01-14 DIAGNOSIS — T300 Burn of unspecified body region, unspecified degree: Secondary | ICD-10-CM | POA: Insufficient documentation

## 2016-01-14 MED ORDER — HYDROCODONE-ACETAMINOPHEN 10-325 MG PO TABS: 1.0000 | ORAL_TABLET | Freq: Four times a day (QID) | ORAL | Status: DC | PRN

## 2016-01-14 MED ORDER — SILVER SULFADIAZINE 1 % EX CREA: 1.0000 "application " | TOPICAL_CREAM | Freq: Three times a day (TID) | CUTANEOUS | Status: DC

## 2016-01-14 NOTE — Patient Instructions (Signed)
[place] at [facility] Discharge Instructions  RECOMMENDATIONS MADE BY THE CONSULTANT AND ANY TEST RESULTS WILL BE SENT TO YOUR REFERRING PHYSICIAN.  When it is time to change your patch - Dr. Whitney Muse wants you to double your patches.   We are writing you a prescription for Hydrocodone 10/325. Take 1 tablet every 4 hours as needed for pain.   Continue the Miralax. This softens the stool.   Start Senokot if needed - This is a laxative. Sometimes people need this to help them have a bowel movement.  Return to [place] as scheduled.   Please review the Constipation Sheet that we gave you.     Thank you for choosing [place] at [facility] to provide your oncology and hematology care.  To afford each patient quality time with our provider, please arrive at least 15 minutes before your scheduled appointment time.   Beginning January 23rd 2017 lab work for the [facility] will be done in the  Main lab at [facility] on 1st floor. If you have a lab appointment with the [facility] please come in thru the  Main Entrance and check in at the main information desk  You need to re-schedule your appointment should you arrive 10 or more minutes late.  We strive to give you quality time with our providers, and arriving late affects you and other patients whose appointments are after yours.  Also, if you no show three or more times for appointments you may be dismissed from the clinic at the providers discretion.     Again, thank you for choosing [facility].  Our hope is that these requests will decrease the amount of time that you wait before being seen by our physicians.       _____________________________________________________________  Should you have questions after your visit to [facility], please contact our office at [phone] between the hours of 8:30 a.m. and 4:30 p.m.  Voicemails left after 4:30 p.m. will not  be returned until the following business day.  For prescription refill requests, have your pharmacy contact our office.         Resources For Cancer Patients and their Caregivers ? American Cancer Society: Can assist with transportation, wigs, general needs, runs Look Good Feel Better.        ([phone] ? Cancer Care: Provides financial assistance, online support groups, medication/co-pay assistance.  1-800-813-HOPE 5026956146) ? [place] Assists [place] cancer patients and their families through emotional , educational and financial support.  639-154-3648 ? [place] DSS Where to apply for food stamps, Medicaid and utility assistance. (307)852-1489 ? RCATS: Transportation to medical appointments. (531) 488-4001 ? Social Security Administration: May apply for disability if have a Stage IV cancer. 212-365-8303 (312)678-1575 ? [employer], Disability and Transit Services: Assists with nutrition, care and transit needs. [place] Support Programs: @10RELATIVEDAYS @ > Cancer Support Group  2nd Tuesday of the month 1pm-2pm, Journey Room  > Creative Journey  3rd Tuesday of the month 1130am-1pm, Journey Room  > Look Good Feel Better  1st Wednesday of the month 10am-12 noon, Journey Room (Call [place] to register [phone])

## 2016-01-14 NOTE — Progress Notes (Signed)
Matthew [name]  PROGRESS NOTE  Patient Care Team: [name], MD as PCP - General (Family Medicine)  CHIEF COMPLAINTS/PURPOSE OF CONSULTATION:  CT angiography chest w/contrast 12/10/2015 multiple thoracic and rib osseous lesions suggesting osseous metastatic disease. No obvious primary PSA 12/12/2015 153.85    Prostate carcinoma ([place])   12/10/2015 Imaging CTA chest, multiple thoracic and rib osseous lesions. no primary evident. Atherosclerosis including aortic and CAD   12/12/2015 Imaging CT abdomen/pelvis with sclerotic osseous lesions througout the lumbar spine, sacrum, bilateral iliac bones and L proximal femur worrisome for sclerotic osseous mets,mild prostatomegaly, no LAD, Infrarenal 3.8 cm AAA   12/12/2015 Tumor Marker PSA 153.85   12/18/2015 Imaging Bone Scan Diffuse bony metastatic disease, posterior calvarium, sternum, thoracic, lumbar spine, bilateral ribs, L shoulder, bilateral bony pelvis, proximal L femur   12/24/2015 Initial Biopsy CT biopsy of L iliac bone lesion   12/26/2015 Pathology Results Bone, biopsy, left iliac - POSITIVE FOR METASTATIC ADENOCARCINOMA.   12/28/2015 -  Chemotherapy Firmagon instituted 240 mg    01/01/2016 Procedure Port placed by IR.   01/07/2016 -  Chemotherapy Docetaxel every 21 days     HISTORY OF PRESENTING ILLNESS:  Matthew [name] 60 y.o. male is here for further follow-up of stage IV prostate cancer. He is on firmagon (which he notes drastically improved his pain) taxotere and prednisone.   [name] returns to the [facility] today accompanied by one of his daughters.  He says he's no more tired than what he usually is. He says he liked the firmagon. He noted an increase in pain after chemotherapy. He finally started the duragesic patch yesterday.   He currently feels that he doesn't have the pressure across his sternum when he gets up or gets down.  He notes that he's had bad teeth all of his life and needs to get all of his teeth  out, but he is having issues with his dental insurance. He has to be on his current dental insurance X 1 year before they will pay for his dental extractions.  He denies any nausea or vomiting, but notes that he has been constipated. He was reminded that he needs to drink liquids with the Miralax, and that he may need to use Senokot in addition to the Miralax to stimulate his stools along with softening them. His daughter adds that some of his pain was alleviated once he finally had a bowel movement recently. He additionally reports chronic swallowing issues "once in a while," and that he has never had an EGD. He thinks that being so stopped up was mostly what made him feel so bad.  When noted that it's obvious that he's more comfortable, he says "oh yeah, oh yeah; it's amazing what dope will do for you." But he notes that he seriously does notice an improvement in his pain.   As an aside, he reports some grease burns on his L hand from grilling out.  Overall he is pleased with how he has done with chemotherapy.    MEDICAL HISTORY:  Past Medical History  Diagnosis Date  . DVT (deep venous thrombosis) ([place])   . Arthritis   . Cancer [facility])     Prostate  . Kidney stones   . Prostate carcinoma ([place]) 12/26/2015  . Bone metastases ([place]) 12/26/2015    SURGICAL HISTORY: Past Surgical History  Procedure Laterality Date  . Circumcision  30 years ago    SOCIAL HISTORY: Social History   Social History  .  Marital Status: Single    Spouse Name: N/A  . Number of Children: N/A  . Years of Education: N/A   Occupational History  . Not on file.   Social History Main Topics  . Smoking status: Current Every Day Smoker -- 1.00 packs/day    Types: Cigarettes  . Smokeless tobacco: Not on file  . Alcohol Use: Yes     Comment: occ  . Drug Use: No  . Sexual Activity: Not on file   Other Topics Concern  . Not on file   Social History Narrative  Divorced for 20 years 2 girls 4  grandchildren Smokes pack a day; Started at 60 years old Beer every once in a while  Hobbies-plays pool Works in Architect  FAMILY HISTORY: History reviewed. No pertinent family history.  Grandmother (adopted him) Mother died in 101s from bone cancer Never met father Brothers and sisters all died from cancer 3 brother 3 sisters   ALLERGIES:  is allergic to meloxicam.  MEDICATIONS:  Current Outpatient Prescriptions  Medication Sig Dispense Refill  . fentaNYL (DURAGESIC - DOSED MCG/HR) 25 MCG/HR patch Place 1 patch (25 mcg total) onto the skin every 3 (three) days. May change to 2 patches after 3 days if needed 10 patch 0  . aspirin EC 81 MG tablet Take 81 mg by mouth daily. Reported on 01/14/2016    . Calcium Carb-Cholecalciferol (CALCIUM 500 + D3) 500-600 MG-UNIT TABS Take 2 tablets by mouth daily. 60 tablet 6  . Degarelix Acetate (FIRMAGON [place]) Inject into the skin every 28 (twenty-eight) days. Reported on 12/28/2015    . denosumab (XGEVA) 120 MG/1.7ML SOLN injection Inject 120 mg into the skin every 28 (twenty-eight) days.    Marland Kitchen dexamethasone (DECADRON) 4 MG tablet The day before, day of, and day after chemo take 2 tabs in am and 2 tabs in pm. Take with food. 36 tablet 1  . DOCEtaxel (TAXOTERE IV) Inject into the vein. Has not started yet, to be given every 21 days    . ergocalciferol (VITAMIN D2) 50000 units capsule Take 1 capsule (50,000 Units total) by mouth once a week. 4 capsule 6  . HYDROcodone-acetaminophen (NORCO) 10-325 MG tablet Take 1 tablet by mouth every 4 (four) hours as needed. 60 tablet 0  . HYDROcodone-acetaminophen (NORCO) 10-325 MG tablet Take 1 tablet by mouth every 6 (six) hours as needed. 90 tablet 0  . ibuprofen (ADVIL,MOTRIN) 600 MG tablet Take 300 mg by mouth 2 (two) times daily as needed for moderate pain. Reported on 01/14/2016    . lidocaine-prilocaine (EMLA) cream Apply a quarter size amount to port site 1 hour prior to chemo. Do not rub in. Cover with  plastic wrap. 30 g 3  . Liniments (BLUE-EMU SUPER STRENGTH EX) Apply 1 application topically 4 (four) times daily as needed (knee/hip pain). Reported on 01/14/2016    . ondansetron (ZOFRAN) 8 MG tablet Take 1 tablet (8 mg total) by mouth every 8 (eight) hours as needed for nausea or vomiting. 30 tablet 2  . pegfilgrastim (NEULASTA ONPRO) 6 MG/0.6ML injection Inject 6 mg into the skin every 21 ( twenty-one) days. Inject via provided programmed delivery device.    . predniSONE (DELTASONE) 5 MG tablet Take 1 tablet (5 mg total) by mouth daily with breakfast. 60 tablet 1  . prochlorperazine (COMPAZINE) 10 MG tablet Take 1 tablet (10 mg total) by mouth every 6 (six) hours as needed for nausea or vomiting. 30 tablet 2  . silver sulfADIAZINE (SILVADENE)  1 % cream Apply 1 application topically 3 (three) times daily. 400 g 0   No current facility-administered medications for this visit.    Review of Systems  Constitutional: Positive for malaise/fatigue.  HENT: Negative.   Eyes: Negative.   Respiratory: Negative.   Cardiovascular: Positive for chest pain.  Gastrointestinal: Negative.   Genitourinary: Positive for dysuria.       Every once in a while Dysuria. Has nocturia goes once or twice a night  Musculoskeletal: Positive for joint pain.       Hip pain   Skin: Negative.   Neurological: Negative.   Endo/Heme/Allergies: Negative.   Psychiatric/Behavioral: Negative.   All other systems reviewed and are negative. 14 point ROS was done and is otherwise as detailed above or in HPI   PHYSICAL EXAMINATION: ECOG PERFORMANCE STATUS: 1 - Symptomatic but completely ambulatory  Filed Vitals:   01/14/16 0900 01/14/16 0945  BP: 118/72 118/72  Pulse: 101 101  Temp: 97 F (36.1 C) 97 F (36.1 C)  Resp: 16 16   Filed Weights   01/14/16 0900 01/14/16 0945  Weight: 235 lb (106.595 kg) 235 lb (106.595 kg)    Physical Exam  Constitutional: He is oriented to person, place, and time and  well-developed, well-nourished, and in no distress.  HENT:  Head: Normocephalic and atraumatic.  Nose: Nose normal.  Mouth/Throat: Oropharynx is clear and moist. No oropharyngeal exudate.  Poor dentition with multiple missing teeth  Eyes: Conjunctivae and EOM are normal. Pupils are equal, round, and reactive to light. Right eye exhibits no discharge. Left eye exhibits no discharge. No scleral icterus.  Neck: Normal range of motion. Neck supple. No tracheal deviation present. No thyromegaly present.  Cardiovascular: Normal rate, regular rhythm and normal heart sounds.  Exam reveals no gallop and no friction rub.   No murmur heard. Pulmonary/Chest: Effort normal. He has no wheezes. He has no rales.  Decreased throughout  Abdominal: Soft. Bowel sounds are normal. He exhibits no distension and no mass. There is no tenderness. There is no rebound and no guarding.  Musculoskeletal: Normal range of motion. He exhibits no edema.  Lymphadenopathy:    He has no cervical adenopathy.  Neurological: He is alert and oriented to person, place, and time. He has normal reflexes. No cranial nerve deficit. Gait normal. Coordination normal.  Skin: Skin is warm and dry. No rash noted.  Psychiatric: Mood, memory, affect and judgment normal.  Nursing note and vitals reviewed.  LABORATORY DATA:  I have reviewed the data as listed  CBC    Component Value Date/Time   WBC 15.5* 01/07/2016 1021   RBC 4.91 01/07/2016 1021   HGB 14.2 01/07/2016 1021   HCT 42.8 01/07/2016 1021   PLT 248 01/07/2016 1021   MCV 87.2 01/07/2016 1021   MCH 28.9 01/07/2016 1021   MCHC 33.2 01/07/2016 1021   RDW 13.4 01/07/2016 1021   LYMPHSABS 1.1 01/07/2016 1021   MONOABS 0.5 01/07/2016 1021   EOSABS 0.0 01/07/2016 1021   BASOSABS 0.0 01/07/2016 1021   CMP     Component Value Date/Time   NA 136 01/07/2016 1021   K 4.4 01/07/2016 1021   CL 103 01/07/2016 1021   CO2 24 01/07/2016 1021   GLUCOSE 171* 01/07/2016 1021   BUN  16 01/07/2016 1021   CREATININE 1.09 01/07/2016 1021   CALCIUM 9.3 01/07/2016 1021   PROT 7.4 01/07/2016 1021   ALBUMIN 4.0 01/07/2016 1021   AST 19 01/07/2016 1021   ALT  17 01/07/2016 1021   ALKPHOS 194* 01/07/2016 1021   BILITOT 0.4 01/07/2016 1021   GFRNONAA >60 01/07/2016 1021   GFRAA >60 01/07/2016 1021    Results for SENECA, HOBACK (MRN [id number]) as of 01/14/2016 17:35  Ref. Range 12/12/2015 07:50 12/28/2015 09:15 01/07/2016 10:21  PSA Latest Ref Range: 0.00-4.00 ng/mL 153.85 (H) 159.00 (H) 89.94 (H)          RADIOGRAPHIC STUDIES: I have personally reviewed the radiological images as listed and agreed with the findings in the report.  Study Result     CLINICAL DATA: 60 year old male with sclerotic bony lesions identified on recent chest abdomen and pelvis CT. Elevated PSA  EXAM: NUCLEAR MEDICINE WHOLE BODY BONE SCAN  TECHNIQUE: Whole body anterior and posterior images were obtained approximately 3 hours after intravenous injection of radiopharmaceutical.  RADIOPHARMACEUTICALS: 25.6 mCi Technetium-57mMDP IV  COMPARISON: 12/12/2015 CT  FINDINGS: Areas of increased focal activity within the posterior calvarium, sternum, thoracic and lumbar spine, bilateral ribs, left shoulder, bilateral bony pelvis, and proximal left femur are compatible with bony metastatic disease.  Degenerative changes within both knees noted, left-greater-than-right.  IMPRESSION: Diffuse bony metastatic disease as described.   Electronically Signed  By: JMargarette CanadaM.D.  On: 12/18/2015 14:57     PATHOLOGY   ASSESSMENT & PLAN:  Stage IV adenocarcinoma of prostate, hormone sensitive disease Bony Metastatic Disease Abnormal CT imaging Elevated PSA Urinary hesitancy, frequency Tobacco Abuse Cancer related Pain Weight loss  We will proceed as follows:  1. He is to double his fentanyl when he changes his patch as he is still using his hydrocodone every 4 hours  for breakthrough 2. Patient was again instructed on treatment/prevention of constipation 3. He will return for follow-up on cycle #2 of taxotere with labs and physical exam 4. We will refer him to Dr. KEnrique Sack xgeva will continue to be held 5. Advised patient and family to call with any problems or concerns.  6. Silvidene prescribed for his burn on his hand  All questions were answered. The patient knows to call the clinic with any problems, questions or concerns.  This document serves as a record of services personally performed by SAncil Linsey MD. It was created on her behalf by KToni Amend a trained medical scribe. The creation of this record is based on the scribe's personal observations and the provider's statements to them. This document has been checked and approved by the attending provider.  I have reviewed the above documentation for accuracy and completeness, and I agree with the above.  This note was electronically signed.    PMolli Hazard MD  01/14/2016 5:35 PM

## 2016-01-21 ENCOUNTER — Telehealth (HOSPITAL_COMMUNITY): Payer: Self-pay

## 2016-01-21 ENCOUNTER — Other Ambulatory Visit (HOSPITAL_COMMUNITY): Payer: Self-pay | Admitting: Oncology

## 2016-01-21 NOTE — Telephone Encounter (Signed)
01/21/16       Called patient's daughter Melissa Hand and left msg.  for patient to  call Dental Medicine and schedule Dental Consult w/Dr. Enrique Sack.  LRI

## 2016-01-21 NOTE — Progress Notes (Signed)
I received the following message from Dr. Enrique Sack via a staff message:  I have been informed that the patient saw Dr. Gerald Stabs (DDS) for dental evaln and was treatment planned for full mouth extractions. However, he could not afford dental care with is DDS and Dr. Ahmed Prima has referred patient to me for evaluation and treatment. I am trying to schedule him for a consult this week.  DO NOT GIVE ANY XGEVA until I have extracted all remaining teeth and have sufficient time for healing before [name] is administered.  Thank you.   Dr. Jorja Loa   I have deleted the [facility] supportive therapy plan.  Firmagon and chemotherapy will continue as planned.  Kemon Devincenzi, PA-C 01/21/2016 2:30 PM

## 2016-01-25 ENCOUNTER — Encounter (HOSPITAL_COMMUNITY): Payer: Self-pay

## 2016-01-25 ENCOUNTER — Other Ambulatory Visit (HOSPITAL_COMMUNITY): Payer: Self-pay | Admitting: Oncology

## 2016-01-25 ENCOUNTER — Encounter (HOSPITAL_BASED_OUTPATIENT_CLINIC_OR_DEPARTMENT_OTHER): Payer: 59

## 2016-01-25 ENCOUNTER — Encounter (HOSPITAL_COMMUNITY): Payer: 59

## 2016-01-25 VITALS — BP 104/68 | HR 74 | Resp 16

## 2016-01-25 DIAGNOSIS — C61 Malignant neoplasm of prostate: Secondary | ICD-10-CM

## 2016-01-25 DIAGNOSIS — C7951 Secondary malignant neoplasm of bone: Secondary | ICD-10-CM

## 2016-01-25 DIAGNOSIS — Z5111 Encounter for antineoplastic chemotherapy: Secondary | ICD-10-CM | POA: Diagnosis not present

## 2016-01-25 LAB — COMPREHENSIVE METABOLIC PANEL
ALK PHOS: 219 U/L — AB (ref 38–126)
ALT: 23 U/L (ref 17–63)
AST: 18 U/L (ref 15–41)
Albumin: 3.6 g/dL (ref 3.5–5.0)
Anion gap: 6 (ref 5–15)
BUN: 13 mg/dL (ref 6–20)
CHLORIDE: 106 mmol/L (ref 101–111)
CO2: 25 mmol/L (ref 22–32)
CREATININE: 0.87 mg/dL (ref 0.61–1.24)
Calcium: 8.3 mg/dL — ABNORMAL LOW (ref 8.9–10.3)
GFR calc Af Amer: >60 mL/min (ref >60)
GFR calc non Af Amer: >60 mL/min (ref >60)
GLUCOSE: 109 mg/dL — AB (ref 65–99)
Potassium: 4.2 mmol/L (ref 3.5–5.1)
Sodium: 137 mmol/L (ref 135–145)
Total Bilirubin: 0.6 mg/dL (ref 0.3–1.2)
Total Protein: 6.3 g/dL — ABNORMAL LOW (ref 6.5–8.1)

## 2016-01-25 MED ORDER — DENOSUMAB 120 MG/1.7ML ~~LOC~~ SOLN
120.0000 mg | Freq: Once | SUBCUTANEOUS | Status: AC
  Administered 2016-01-25: 120 mg via SUBCUTANEOUS
  Filled 2016-01-25: qty 1.7

## 2016-01-25 MED ORDER — DEGARELIX ACETATE 80 MG ~~LOC~~ SOLR
80.0000 mg | Freq: Once | SUBCUTANEOUS | Status: AC
  Administered 2016-01-25: 80 mg via SUBCUTANEOUS
  Filled 2016-01-25: qty 4

## 2016-01-25 NOTE — Patient Instructions (Signed)
[place] at [facility] Discharge Instructions  RECOMMENDATIONS MADE BY THE CONSULTANT AND ANY TEST RESULTS WILL BE SENT TO YOUR REFERRING PHYSICIAN.  Jayme Cloud and Firmagon given today per orders Follow up as scheduled  Thank you for choosing [place] at [facility] to provide your oncology and hematology care.  To afford each patient quality time with our provider, please arrive at least 15 minutes before your scheduled appointment time.   Beginning January 23rd 2017 lab work for the [facility] will be done in the  Main lab at [facility] on 1st floor. If you have a lab appointment with the [facility] please come in thru the  Main Entrance and check in at the main information desk  You need to re-schedule your appointment should you arrive 10 or more minutes late.  We strive to give you quality time with our providers, and arriving late affects you and other patients whose appointments are after yours.  Also, if you no show three or more times for appointments you may be dismissed from the clinic at the providers discretion.     Again, thank you for choosing [facility].  Our hope is that these requests will decrease the amount of time that you wait before being seen by our physicians.       _____________________________________________________________  Should you have questions after your visit to [facility], please contact our office at [phone] between the hours of 8:30 a.m. and 4:30 p.m.  Voicemails left after 4:30 p.m. will not be returned until the following business day.  For prescription refill requests, have your pharmacy contact our office.         Resources For Cancer Patients and their Caregivers ? American Cancer Society: Can assist with transportation, wigs, general needs, runs Look Good Feel Better.        930-388-6790 ? Cancer Care: Provides financial assistance, online support  groups, medication/co-pay assistance.  1-800-813-HOPE 816-766-7866) ? [place] Assists [place] cancer patients and their families through emotional , educational and financial support.  734-014-7250 ? [place] DSS Where to apply for food stamps, Medicaid and utility assistance. 252-724-8101 ? [employer]: Transportation to medical appointments. (581)182-8050 ? Social Security Administration: May apply for disability if have a Stage IV cancer. [phone] 603-551-4784 ? [employer], Disability and Transit Services: Assists with nutrition, care and transit needs. [place] Support Programs: @10RELATIVEDAYS @ > Cancer Support Group  2nd Tuesday of the month 1pm-2pm, Journey Room  > Creative Journey  3rd Tuesday of the month 1130am-1pm, Journey Room  > Look Good Feel Better  1st Wednesday of the month 10am-12 noon, Journey Room (Call [place] to register [phone])

## 2016-01-25 NOTE — Progress Notes (Signed)
Matthew [name] presents today for injection per MD orders. X-geva 120mg  administered SQ in left Abdomen. Administration without incident. Patient tolerated well.   Matthew [name] presents today for injection per MD orders. Firgagon 80mg  administered SQ in right Abdomen. Administration without incident. Patient tolerated well.

## 2016-01-28 ENCOUNTER — Encounter (HOSPITAL_BASED_OUTPATIENT_CLINIC_OR_DEPARTMENT_OTHER): Payer: 59

## 2016-01-28 ENCOUNTER — Encounter (HOSPITAL_COMMUNITY): Payer: Self-pay

## 2016-01-28 ENCOUNTER — Encounter (HOSPITAL_BASED_OUTPATIENT_CLINIC_OR_DEPARTMENT_OTHER): Payer: 59 | Admitting: Oncology

## 2016-01-28 ENCOUNTER — Inpatient Hospital Stay (HOSPITAL_COMMUNITY): Payer: 59

## 2016-01-28 VITALS — BP 118/73 | HR 80 | Temp 97.3°F | Resp 18 | Wt 237.6 lb

## 2016-01-28 VITALS — BP 107/60 | HR 77 | Temp 98.0°F | Resp 18

## 2016-01-28 DIAGNOSIS — C61 Malignant neoplasm of prostate: Secondary | ICD-10-CM | POA: Diagnosis not present

## 2016-01-28 DIAGNOSIS — C7951 Secondary malignant neoplasm of bone: Secondary | ICD-10-CM

## 2016-01-28 DIAGNOSIS — Z5111 Encounter for antineoplastic chemotherapy: Secondary | ICD-10-CM | POA: Diagnosis not present

## 2016-01-28 LAB — PSA: PSA: 33.38 ng/mL — ABNORMAL HIGH (ref 0.00–4.00)

## 2016-01-28 LAB — CBC WITH DIFFERENTIAL/PLATELET
Basophils Absolute: 0 10*3/uL (ref 0.0–0.1)
Basophils Relative: 0 %
Eosinophils Absolute: 0 10*3/uL (ref 0.0–0.7)
Eosinophils Relative: 0 %
HEMATOCRIT: 39.3 % (ref 39.0–52.0)
HEMOGLOBIN: 13.1 g/dL (ref 13.0–17.0)
LYMPHS ABS: 0.7 10*3/uL (ref 0.7–4.0)
LYMPHS PCT: 8 %
MCH: 29.2 pg (ref 26.0–34.0)
MCHC: 33.3 g/dL (ref 30.0–36.0)
MCV: 87.5 fL (ref 78.0–100.0)
MONO ABS: 0.2 10*3/uL (ref 0.1–1.0)
MONOS PCT: 2 %
NEUTROS ABS: 7.8 10*3/uL — AB (ref 1.7–7.7)
NEUTROS PCT: 89 %
Platelets: 329 10*3/uL (ref 150–400)
RBC: 4.49 MIL/uL (ref 4.22–5.81)
RDW: 14.5 % (ref 11.5–15.5)
WBC: 8.7 10*3/uL (ref 4.0–10.5)

## 2016-01-28 LAB — COMPREHENSIVE METABOLIC PANEL
ALK PHOS: 207 U/L — AB (ref 38–126)
ALT: 18 U/L (ref 17–63)
ANION GAP: 6 (ref 5–15)
AST: 20 U/L (ref 15–41)
Albumin: 3.6 g/dL (ref 3.5–5.0)
BILIRUBIN TOTAL: 0.6 mg/dL (ref 0.3–1.2)
BUN: 14 mg/dL (ref 6–20)
CALCIUM: 8.3 mg/dL — AB (ref 8.9–10.3)
CO2: 24 mmol/L (ref 22–32)
Chloride: 106 mmol/L (ref 101–111)
Creatinine, Ser: 0.8 mg/dL (ref 0.61–1.24)
GFR calc Af Amer: >60 mL/min (ref >60)
GLUCOSE: 197 mg/dL — AB (ref 65–99)
POTASSIUM: 4.3 mmol/L (ref 3.5–5.1)
Sodium: 136 mmol/L (ref 135–145)
TOTAL PROTEIN: 6.8 g/dL (ref 6.5–8.1)

## 2016-01-28 MED ORDER — SODIUM CHLORIDE 0.9 % IV SOLN
Freq: Once | INTRAVENOUS | Status: DC
  Filled 2016-01-28: qty 4

## 2016-01-28 MED ORDER — LORAZEPAM 2 MG/ML IJ SOLN
0.5000 mg | Freq: Once | INTRAMUSCULAR | Status: AC
  Administered 2016-01-28: 0.5 mg via INTRAVENOUS

## 2016-01-28 MED ORDER — SODIUM CHLORIDE 0.9 % IV SOLN
Freq: Once | INTRAVENOUS | Status: AC
  Administered 2016-01-28: 11:00:00 via INTRAVENOUS

## 2016-01-28 MED ORDER — LORAZEPAM 2 MG/ML IJ SOLN
INTRAMUSCULAR | Status: AC
  Filled 2016-01-28: qty 1

## 2016-01-28 MED ORDER — SODIUM CHLORIDE 0.9 % IV SOLN
Freq: Once | INTRAVENOUS | Status: AC
  Administered 2016-01-28: 8 mg via INTRAVENOUS
  Filled 2016-01-28: qty 4

## 2016-01-28 MED ORDER — SODIUM CHLORIDE 0.9 % IV SOLN
10.0000 mg | Freq: Once | INTRAVENOUS | Status: DC
  Filled 2016-01-28: qty 1

## 2016-01-28 MED ORDER — PEGFILGRASTIM 6 MG/0.6ML ~~LOC~~ PSKT
6.0000 mg | PREFILLED_SYRINGE | Freq: Once | SUBCUTANEOUS | Status: AC
  Administered 2016-01-28: 6 mg via SUBCUTANEOUS
  Filled 2016-01-28: qty 0.6

## 2016-01-28 MED ORDER — DOCETAXEL CHEMO INJECTION 160 MG/16ML
75.0000 mg/m2 | Freq: Once | INTRAVENOUS | Status: AC
  Administered 2016-01-28: 180 mg via INTRAVENOUS
  Filled 2016-01-28: qty 10

## 2016-01-28 MED ORDER — SODIUM CHLORIDE 0.9% FLUSH: 10.0000 mL | INTRAVENOUS | Status: DC | PRN

## 2016-01-28 MED ORDER — HEPARIN SOD (PORK) LOCK FLUSH 100 UNIT/ML IV SOLN
500.0000 [IU] | Freq: Once | INTRAVENOUS | Status: AC | PRN
  Administered 2016-01-28: 500 [IU]
  Filled 2016-01-28: qty 5

## 2016-01-28 MED ORDER — FENTANYL 25 MCG/HR TD PT72: 25.0000 ug | MEDICATED_PATCH | TRANSDERMAL | Status: DC

## 2016-01-28 NOTE — Progress Notes (Signed)
Matthew [name] chemotherapy well today Discharged ambulatory   .Marland KitchenNorton Pastel Hallpresents today for neulasta OBI placement per MD orders. OBI device filled per protocol and placed on left Upper Arm. Needle/catheter placement noted prior to patient leaving. [name] without incident and aware of injection to be delivered in  27 hours.

## 2016-01-28 NOTE — Patient Instructions (Signed)
[place] at [facility] Discharge Instructions  RECOMMENDATIONS MADE BY THE CONSULTANT AND ANY TEST RESULTS WILL BE SENT TO YOUR REFERRING PHYSICIAN.    taxotere today Return as scheduled  Please call the clinic if you have any questions or concerns    Thank you for choosing [place] at [facility] to provide your oncology and hematology care.  To afford each patient quality time with our provider, please arrive at least 15 minutes before your scheduled appointment time.   Beginning January 23rd 2017 lab work for the [facility] will be done in the  Main lab at [facility] on 1st floor. If you have a lab appointment with the [facility] please come in thru the  Main Entrance and check in at the main information desk  You need to re-schedule your appointment should you arrive 10 or more minutes late.  We strive to give you quality time with our providers, and arriving late affects you and other patients whose appointments are after yours.  Also, if you no show three or more times for appointments you may be dismissed from the clinic at the providers discretion.     Again, thank you for choosing [facility].  Our hope is that these requests will decrease the amount of time that you wait before being seen by our physicians.       _____________________________________________________________  Should you have questions after your visit to [facility], please contact our office at [phone] between the hours of 8:30 a.m. and 4:30 p.m.  Voicemails left after 4:30 p.m. will not be returned until the following business day.  For prescription refill requests, have your pharmacy contact our office.         Resources For Cancer Patients and their Caregivers ? American Cancer Society: Can assist with transportation, wigs, general needs, runs Look Good Feel Better.        519 669 0632 ? Cancer Care: Provides  financial assistance, online support groups, medication/co-pay assistance.  1-800-813-HOPE 620-371-5634) ? [place] Assists [place] cancer patients and their families through emotional , educational and financial support.  986-096-0157 ? [place] DSS Where to apply for food stamps, Medicaid and utility assistance. (220)884-8879 ? RCATS: Transportation to medical appointments. (810)730-4640 ? Social Security Administration: May apply for disability if have a Stage IV cancer. 947-136-3919 228 632 1902 ? [employer], Disability and Transit Services: Assists with nutrition, care and transit needs. [place] Support Programs: @10RELATIVEDAYS @ > Cancer Support Group  2nd Tuesday of the month 1pm-2pm, Journey Room  > Creative Journey  3rd Tuesday of the month 1130am-1pm, Journey Room  > Look Good Feel Better  1st Wednesday of the month 10am-12 noon, Journey Room (Call [place] to register [phone])

## 2016-01-28 NOTE — Assessment & Plan Note (Signed)
Bone metastases.  [place] ON HOLD until dental clearance.

## 2016-01-28 NOTE — Patient Instructions (Signed)
[place] at [facility] Discharge Instructions  RECOMMENDATIONS MADE BY THE CONSULTANT AND ANY TEST RESULTS WILL BE SENT TO YOUR REFERRING PHYSICIAN.  Exam done and seen today by Kirby Crigler Taxotere every 3 weeks Nanda Quinton as planned [name] on Hold Dr. Enrique Sack appointment tomorrow Return to see the Doctor in 3 weeks for follow up. Call the clinic for any concerns or questions.  Thank you for choosing [place] at [facility] to provide your oncology and hematology care.  To afford each patient quality time with our provider, please arrive at least 15 minutes before your scheduled appointment time.   Beginning January 23rd 2017 lab work for the [facility] will be done in the  Main lab at [facility] on 1st floor. If you have a lab appointment with the [facility] please come in thru the  Main Entrance and check in at the main information desk  You need to re-schedule your appointment should you arrive 10 or more minutes late.  We strive to give you quality time with our providers, and arriving late affects you and other patients whose appointments are after yours.  Also, if you no show three or more times for appointments you may be dismissed from the clinic at the providers discretion.     Again, thank you for choosing [facility]  Our hope is that these requests will decrease the amount of time that you wait before being seen by our physicians.       _____________________________________________________________  Should you have questions after your visit to [facility], please contact our office at [phone] between the hours of 8:30 a.m. and 4:30 p.m.  Voicemails left after 4:30 p.m. will not be returned until the following business day.  For prescription refill requests, have your pharmacy contact our office.         Resources For Cancer Patients and their Caregivers ? American Cancer  Society: Can assist with transportation, wigs, general needs, runs Look Good Feel Better.        (507) 185-1589 ? Cancer Care: Provides financial assistance, online support groups, medication/co-pay assistance.  1-800-813-HOPE 971-111-3275) ? [place] Assists [place] cancer patients and their families through emotional , educational and financial support.  (878) 879-0366 ? [place] DSS Where to apply for food stamps, Medicaid and utility assistance. (716) 451-3221 ? RCATS: Transportation to medical appointments. 303-406-8625 ? Social Security Administration: May apply for disability if have a Stage IV cancer. 714-129-3520 223-514-1816 ? [employer], Disability and Transit Services: Assists with nutrition, care and transit needs. [place] Support Programs: @10RELATIVEDAYS @ > Cancer Support Group  2nd Tuesday of the month 1pm-2pm, Journey Room  > Creative Journey  3rd Tuesday of the month 1130am-1pm, Journey Room  > Look Good Feel Better  1st Wednesday of the month 10am-12 noon, Journey Room (Call [place] to register [phone])

## 2016-01-28 NOTE — Progress Notes (Signed)
Purvis Kilts, MD [place] [address]  Prostate carcinoma [facility]) - Plan: fentaNYL ([place] - DOSED MCG/HR) 25 MCG/HR patch  Bone metastases ([place]) - Plan: fentaNYL ([place] - DOSED MCG/HR) 25 MCG/HR patch  CURRENT THERAPY: Firmagon beginning on 12/28/2015.  Docetaxel every 21 days beginning on 01/07/2016.  XGEVA INAPPROPRIATELY GIVEN ON 01/25/2016. XGEVA ON HOLD UNTIL DENTAL CLEARANCE.  INTERVAL HISTORY: CHANEY MACLAREN 60 y.o. 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    Prostate carcinoma ([place])   12/10/2015 Imaging CTA chest, multiple thoracic and rib osseous lesions. no primary evident. Atherosclerosis including aortic and CAD   12/12/2015 Imaging CT abdomen/pelvis with sclerotic osseous lesions througout the lumbar spine, sacrum, bilateral iliac bones and L proximal femur worrisome for sclerotic osseous mets,mild prostatomegaly, no LAD, Infrarenal 3.8 cm AAA   12/12/2015 Tumor Marker PSA 153.85   12/18/2015 Imaging Bone Scan Diffuse bony metastatic disease, posterior calvarium, sternum, thoracic, lumbar spine, bilateral ribs, L shoulder, bilateral bony pelvis, proximal L femur   12/24/2015 Initial Biopsy CT biopsy of L iliac bone lesion   12/26/2015 Pathology Results Bone, biopsy, left iliac - POSITIVE FOR METASTATIC ADENOCARCINOMA.   12/28/2015 -  Chemotherapy Firmagon instituted 240 mg    01/01/2016 Procedure Port placed by IR.   01/07/2016 -  Chemotherapy Docetaxel every 21 days    He is doing well.  He has two nonspecific complaints that are unrelated to chemotherapy and his prostate cancer: 1. A left chest "odd feeling."  This occurred when he was resting in bed, laying in the left lateral, decubitus position.  He denies any SOB or dyspnea.  He denies any chest pain at that time.  He denies any other issues related to it.  He notes that it resolved with re-positioning in bed and  has not occurred since. 2. Low pelvic/groin pain x 3.  Each lasting less than 1 minute.  It resolved after BM (loose stool).  No recurrence since.  He denies any nausea/vomiting.  He denies any peripheral neuropathy.  He denies any diarrhea (other than the episode above).   Review of Systems  Constitutional: Negative for fever, chills and weight loss.  HENT: Negative.   Eyes: Negative.  Negative for blurred vision and double vision.  Respiratory: Negative.   Cardiovascular: Negative.   Gastrointestinal: Negative.   Genitourinary: Negative.   Musculoskeletal: Negative.   Skin: Negative.   Neurological: Negative.  Negative for headaches.  Endo/Heme/Allergies: Negative.   Psychiatric/Behavioral: Negative.     Past Medical History  Diagnosis Date  . DVT (deep venous thrombosis) ([place])   . Arthritis   . Cancer [facility])     Prostate  . Kidney stones   . Prostate carcinoma ([place]) 12/26/2015  . Bone metastases ([place]) 12/26/2015    Past Surgical History  Procedure Laterality Date  . Circumcision  30 years ago    No family history on file.  Social History   Social History  . Marital Status: Single    Spouse Name: N/A  . Number of Children: N/A  . Years of Education: N/A   Social History Main Topics  . Smoking status: Current Every Day Smoker -- 1.00 packs/day    Types: Cigarettes  . Smokeless tobacco: Not on file  . Alcohol Use: Yes     Comment: occ  . Drug Use: No  . Sexual Activity: Not on file   Other  Topics Concern  . Not on file   Social History Narrative     PHYSICAL EXAMINATION  ECOG PERFORMANCE STATUS: 1 - Symptomatic but completely ambulatory  Filed Vitals:   01/28/16 0936  BP: 118/73  Pulse: 80  Temp: 97.3 F (36.3 C)  Resp: 18    GENERAL:alert, no distress, well nourished, well developed, comfortable, cooperative, obese, smiling and Accompanied by his daughter.  He is in chemo-recliner having started chemotherapy. SKIN: skin color, texture, turgor  are normal, no rashes or significant lesions HEAD: Normocephalic, No masses, lesions, tenderness or abnormalities EYES: normal, Conjunctiva are pink and non-injected EARS: External ears normal OROPHARYNX:lips, buccal mucosa, and tongue normal and mucous membranes are moist  NECK: supple, trachea midline LYMPH:  no palpable lymphadenopathy BREAST:not examined LUNGS: clear to auscultation and percussion. HEART: regular rate & rhythm, no murmurs, no gallops, S1 normal and S2 normal ABDOMEN:abdomen soft, non-tender, obese and normal bowel sounds BACK: Back symmetric, no curvature. EXTREMITIES:less then 2 second capillary refill, no joint deformities, effusion, or inflammation, no skin discoloration, no cyanosis  NEURO: alert & oriented x 3 with fluent speech, no focal motor/sensory deficits, gait normal   LABORATORY DATA: CBC    Component Value Date/Time   WBC 8.7 01/28/2016 0915   RBC 4.49 01/28/2016 0915   HGB 13.1 01/28/2016 0915   HCT 39.3 01/28/2016 0915   PLT 329 01/28/2016 0915   MCV 87.5 01/28/2016 0915   MCH 29.2 01/28/2016 0915   MCHC 33.3 01/28/2016 0915   RDW 14.5 01/28/2016 0915   LYMPHSABS 0.7 01/28/2016 0915   MONOABS 0.2 01/28/2016 0915   EOSABS 0.0 01/28/2016 0915   BASOSABS 0.0 01/28/2016 0915      Chemistry      Component Value Date/Time   NA 136 01/28/2016 0915   K 4.3 01/28/2016 0915   CL 106 01/28/2016 0915   CO2 24 01/28/2016 0915   BUN 14 01/28/2016 0915   CREATININE 0.80 01/28/2016 0915      Component Value Date/Time   CALCIUM 8.3* 01/28/2016 0915   ALKPHOS 207* 01/28/2016 0915   AST 20 01/28/2016 0915   ALT 18 01/28/2016 0915   BILITOT 0.6 01/28/2016 0915      Lab Results  Component Value Date   PSA 89.94* 01/07/2016   PSA 159.00* 12/28/2015   PSA 153.85* 12/12/2015     PENDING LABS:   RADIOGRAPHIC STUDIES:  Ir Fluoro Guide Cv Line Right  01/01/2016  INDICATION: Cancer EXAM: IMPLANTED PORT A CATH PLACEMENT WITH ULTRASOUND AND  FLUOROSCOPIC GUIDANCE MEDICATIONS: ; The antibiotic was administered within an appropriate time interval prior to skin puncture. ANESTHESIA/SEDATION: Moderate (conscious) sedation was employed during this procedure. A total of Versed 3.5 mg and Fentanyl 100 mcg was administered intravenously. Moderate Sedation Time: 27 minutes. The patient's level of consciousness and vital signs were monitored continuously by radiology nursing throughout the procedure under my direct supervision. FLUOROSCOPY TIME:  minutes, 24 seconds (73.7 mGy) COMPLICATIONS: None immediate. PROCEDURE: The procedure, risks, benefits, and alternatives were explained to the patient. Questions regarding the procedure were encouraged and answered. The patient understands and consents to the procedure. The right neck and chest were prepped with chlorhexidine in a sterile fashion, and a sterile drape was applied covering the operative field. Maximum barrier sterile technique with sterile gowns and gloves were used for the procedure. A timeout was performed prior to the initiation of the procedure. Local anesthesia was provided with 1% lidocaine with epinephrine. After creating a small venotomy incision,  a micropuncture kit was utilized to access the internal jugular vein under direct, real-time ultrasound guidance. Ultrasound image documentation was performed. The microwire was kinked to measure appropriate catheter length. A subcutaneous port pocket was then created along the upper chest wall utilizing a combination of sharp and blunt dissection. The pocket was irrigated with sterile saline. A single lumen ISP power injectable port was chosen for placement. The 8 Fr catheter was tunneled from the port pocket site to the venotomy incision. The port was placed in the pocket. 3-0 Ethilon stitches were utilized to secure the port into the pocket. The external catheter was trimmed to appropriate length. At the venotomy, an 8 Fr peel-away sheath was placed  over a guidewire under fluoroscopic guidance. The catheter was then placed through the sheath and the sheath was removed. Final catheter positioning was confirmed and documented with a fluoroscopic spot radiograph. The port was accessed with a Huber needle, aspirated and flushed with heparinized saline. The venotomy site was closed with an interrupted 4-0 Vicryl suture. The port pocket incision was closed with interrupted 2-0 Vicryl suture and the skin was opposed with a running subcuticular 4-0 Vicryl suture. Dermabond and Steri-strips were applied to both incisions. Dressings were placed. The patient tolerated the procedure well without immediate post procedural complication. IMPRESSION: Successful placement of a right internal jugular approach power injectable Port-A-Cath. The catheter is ready for immediate use. Electronically Signed   By: Marybelle Killings M.D.   On: 01/01/2016 17:24   Ir US Guide Vasc Access Right  01/01/2016  INDICATION: Cancer EXAM: IMPLANTED PORT A CATH PLACEMENT WITH ULTRASOUND AND FLUOROSCOPIC GUIDANCE MEDICATIONS: ; The antibiotic was administered within an appropriate time interval prior to skin puncture. ANESTHESIA/SEDATION: Moderate (conscious) sedation was employed during this procedure. A total of Versed 3.5 mg and Fentanyl 100 mcg was administered intravenously. Moderate Sedation Time: 27 minutes. The patient's level of consciousness and vital signs were monitored continuously by radiology nursing throughout the procedure under my direct supervision. FLUOROSCOPY TIME:  minutes, 24 seconds (13.2 mGy) COMPLICATIONS: None immediate. PROCEDURE: The procedure, risks, benefits, and alternatives were explained to the patient. Questions regarding the procedure were encouraged and answered. The patient understands and consents to the procedure. The right neck and chest were prepped with chlorhexidine in a sterile fashion, and a sterile drape was applied covering the operative field. Maximum  barrier sterile technique with sterile gowns and gloves were used for the procedure. A timeout was performed prior to the initiation of the procedure. Local anesthesia was provided with 1% lidocaine with epinephrine. After creating a small venotomy incision, a micropuncture kit was utilized to access the internal jugular vein under direct, real-time ultrasound guidance. Ultrasound image documentation was performed. The microwire was kinked to measure appropriate catheter length. A subcutaneous port pocket was then created along the upper chest wall utilizing a combination of sharp and blunt dissection. The pocket was irrigated with sterile saline. A single lumen ISP power injectable port was chosen for placement. The 8 Fr catheter was tunneled from the port pocket site to the venotomy incision. The port was placed in the pocket. 3-0 Ethilon stitches were utilized to secure the port into the pocket. The external catheter was trimmed to appropriate length. At the venotomy, an 8 Fr peel-away sheath was placed over a guidewire under fluoroscopic guidance. The catheter was then placed through the sheath and the sheath was removed. Final catheter positioning was confirmed and documented with a fluoroscopic spot radiograph. The port was accessed  with a Huber needle, aspirated and flushed with heparinized saline. The venotomy site was closed with an interrupted 4-0 Vicryl suture. The port pocket incision was closed with interrupted 2-0 Vicryl suture and the skin was opposed with a running subcuticular 4-0 Vicryl suture. Dermabond and Steri-strips were applied to both incisions. Dressings were placed. The patient tolerated the procedure well without immediate post procedural complication. IMPRESSION: Successful placement of a right internal jugular approach power injectable Port-A-Cath. The catheter is ready for immediate use. Electronically Signed   By: Marybelle Killings M.D.   On: 01/01/2016 17:24     PATHOLOGY:      ASSESSMENT AND PLAN:  Prostate carcinoma ([place]) Metastatic prostate adenocarcinoma, to bone.  Began [place] on 12/28/2015 and systemic chemotherapy with Docetaxel on 01/07/2016 and Prednisone 5 mg BID.  Oncology history is up to date.  Oncology Flowsheet 01/25/2016  degarelix (FIRMAGON) [place] 80 mg   Firmagon monthly moving forward per protocol.  Will transition to Depo-[name] in the future.  XGEVA INAPPROPRIATELY GIVEN ON 01/25/2016. XGEVA ON HOLD UNTIL DENTAL CLEARANCE.  All orders for Xgeva were deleted weeks ago, but on 01/25/2016, Xgeva injection was approved by a covering physician in Dr. Donald Pore absence.  Message via [facility], has been sent to Dr. Enrique Sack to update him on this issue.  He has an appointment to see Dr. Enrique Sack tomorrow, 01/29/2016.   Current pain regimen: Fentanyl 25 mcg and PO Hydrocodone 2/day.  He requests a refill on his Fentanyl which will be done today.  He notes #50 of Hydrocodone left in his Rx and therefore, he does not need a refill at this time.  Pre-treatment labs today as ordered: CBC diff, CMET, PSA.  I personally reviewed and went over laboratory results with the patient.  The results are noted within this dictation.  Labs today meet chemotherapy treatment parameters.  Therefore, chemotherapy will be administered today as planned.  He note a groin pain x 3 lasting about 30 seconds, happening in one after another.  He notes that it bent him over in pain.  This was relieved with a BM and has not occurred.  Stools have returned back to normal.  He notes a left chest "odd feeling."  He reports that it was like a vein pulsating.  He denies any pain.  He reports that it occurred resting when he was laying on his left side in bed (about 30 min after getting in bed).  It lasted for a few minutes and resolved on re-positioning.   This occurred about 1 week ago.  No recurrence since.  He has a right upper chest suture that has broken through the skin and is causing irritation.   I have cut this suture.  Return as scheduled for follow-up on 02/18/2016.  Bone metastases (HCC) Bone metastases.  [place] ON HOLD until dental clearance.    ORDERS PLACED FOR THIS ENCOUNTER: No orders of the defined types were placed in this encounter.    MEDICATIONS PRESCRIBED THIS ENCOUNTER: Meds ordered this encounter  Medications  . fentaNYL ([place] - DOSED MCG/HR) 25 MCG/HR patch    Sig: Place 1 patch (25 mcg total) onto the skin every 3 (three) days. May change to 2 patches after 3 days if needed    Dispense:  10 patch    Refill:  0    Order Specific Question:  Supervising Provider    Answer:  [name] [id number]    THERAPY PLAN:  Continue palliative treatment as outlined above with  monthly Firmagon and beginning systemic chemotherapy consisting of Docetaxel for a planned 6 cycles.  Xgeva to reduce the risk of SRE to begin AFTER dental clearance.  All questions were answered. The patient knows to call the clinic with any problems, questions or concerns. We can certainly see the patient much sooner if necessary.  Patient and plan discussed with Dr. Ancil Linsey and she is in agreement with the aforementioned.   This note is electronically signed by: Robynn Pane, PA-C 01/28/2016 1:16 PM

## 2016-01-28 NOTE — Assessment & Plan Note (Addendum)
Metastatic prostate adenocarcinoma, to bone.  Began [place] on 12/28/2015 and systemic chemotherapy with Docetaxel on 01/07/2016 and Prednisone 5 mg BID.  Oncology history is up to date.  Oncology Flowsheet 01/25/2016  degarelix (FIRMAGON) [place] 80 mg   Firmagon monthly moving forward per protocol.  Will transition to Depo-[name] in the future.  XGEVA INAPPROPRIATELY GIVEN ON 01/25/2016. XGEVA ON HOLD UNTIL DENTAL CLEARANCE.  All orders for Xgeva were deleted weeks ago, but on 01/25/2016, Xgeva injection was approved by a covering physician in Dr. Donald Pore absence.  Message via [facility], has been sent to Dr. Enrique Sack to update him on this issue.  He has an appointment to see Dr. Enrique Sack tomorrow, 01/29/2016.   Current pain regimen: Fentanyl 25 mcg and PO Hydrocodone 2/day.  He requests a refill on his Fentanyl which will be done today.  He notes #50 of Hydrocodone left in his Rx and therefore, he does not need a refill at this time.  Pre-treatment labs today as ordered: CBC diff, CMET, PSA.  I personally reviewed and went over laboratory results with the patient.  The results are noted within this dictation.  Labs today meet chemotherapy treatment parameters.  Therefore, chemotherapy will be administered today as planned.  He note a groin pain x 3 lasting about 30 seconds, happening in one after another.  He notes that it bent him over in pain.  This was relieved with a BM and has not occurred.  Stools have returned back to normal.  He notes a left chest "odd feeling."  He reports that it was like a vein pulsating.  He denies any pain.  He reports that it occurred resting when he was laying on his left side in bed (about 30 min after getting in bed).  It lasted for a few minutes and resolved on re-positioning.   This occurred about 1 week ago.  No recurrence since.  He has a right upper chest suture that has broken through the skin and is causing irritation.  I have cut this suture.  Return as scheduled for  follow-up on 02/18/2016.

## 2016-01-29 ENCOUNTER — Encounter (HOSPITAL_COMMUNITY): Payer: Self-pay | Admitting: Dentistry

## 2016-01-29 ENCOUNTER — Ambulatory Visit (HOSPITAL_COMMUNITY): Payer: Self-pay | Admitting: Dentistry

## 2016-01-29 VITALS — BP 128/74 | HR 78 | Temp 97.8°F

## 2016-01-29 DIAGNOSIS — M264 Malocclusion, unspecified: Secondary | ICD-10-CM

## 2016-01-29 DIAGNOSIS — C7951 Secondary malignant neoplasm of bone: Secondary | ICD-10-CM

## 2016-01-29 DIAGNOSIS — IMO0002 Reserved for concepts with insufficient information to code with codable children: Secondary | ICD-10-CM

## 2016-01-29 DIAGNOSIS — K0530 Chronic periodontitis, unspecified: Secondary | ICD-10-CM | POA: Diagnosis not present

## 2016-01-29 DIAGNOSIS — C61 Malignant neoplasm of prostate: Secondary | ICD-10-CM

## 2016-01-29 DIAGNOSIS — K029 Dental caries, unspecified: Secondary | ICD-10-CM

## 2016-01-29 DIAGNOSIS — K0889 Other specified disorders of teeth and supporting structures: Secondary | ICD-10-CM

## 2016-01-29 DIAGNOSIS — K08409 Partial loss of teeth, unspecified cause, unspecified class: Secondary | ICD-10-CM

## 2016-01-29 DIAGNOSIS — Z9189 Other specified personal risk factors, not elsewhere classified: Secondary | ICD-10-CM

## 2016-01-29 DIAGNOSIS — M270 Developmental disorders of jaws: Secondary | ICD-10-CM

## 2016-01-29 DIAGNOSIS — K045 Chronic apical periodontitis: Secondary | ICD-10-CM | POA: Diagnosis not present

## 2016-01-29 DIAGNOSIS — K083 Retained dental root: Secondary | ICD-10-CM

## 2016-01-29 DIAGNOSIS — K036 Deposits [accretions] on teeth: Secondary | ICD-10-CM

## 2016-01-29 NOTE — Patient Instructions (Signed)
Informed consent was obtained for the surgical procedures including the risk for osteonecrosis of the jaw related to previous Xgeva therapy. Patient wishes to proceed with extraction of all remaining teeth with alveoloplasty and pre-prosthetic surgery in the operating room with general anesthesia. Patient will need approximately 6-8 weeks of being off the [facility] therapy before proceeding with the dental procedures in the operating room. Patient will also need to have additional time between the last anticipated chemotherapy's dose and the dose expected on approximately 03/10/2016.  A tentative date for the surgical procedures will be approximately August 3 or 4, 2017 after further discussion with the medical oncology team. No additional Xgeva therapy is to be given until clearance was provided by DENTAL MEDICINE.  Dr. Enrique Sack

## 2016-01-29 NOTE — Progress Notes (Signed)
DENTAL CONSULTATION  Date of Consultation:  01/29/2016 Patient Name:   Matthew Matthew [name] Date of Birth:   25-Sep-1955 Medical Record Number: [id number]  VITALS: BP 128/74 mmHg  Pulse 78  Temp(Src) 97.8 F (36.6 C) (Oral)  CHIEF COMPLAINT: Patient referred by Dr. Whitney Muse for a dental consultation.  HPI: CASTO Matthew [name] is a 60 year old male recently diagnosed with prostate cancer with metastasis to bone. Patient has had 1 dose of IV Xgeva therapy. Patient now seen to evaluate poor dentition and to provide treatment with discussion of potential risk for osteonecrosis of the jaw with invasive dental procedures related to previous Xgeva therapy.  The patient currently denies acute toothaches, swellings, or abscesses. Patient was last seen in May 2017 by Dr. Gerald Stabs as part of a pre-Xgeva therapy dental protocol examination. Patient was scheduled for extraction of remaining teeth with alveoloplasty and pre-prosthetic surgery as needed. Due to concerns with his dental insurance coverage, the patient was subsequently referred to dental medicine to evaluate patient did discuss treatment options in relationship to the risks, benefits and potential complications.   The patient was previously seen for dental treatment approximately 3-4 years ago with Dr. Ahmed Prima to have several teeth extracted. There were no complications with those extraction procedures. The patient does not seek regular dental care. Patient denies having any partial dentures. Patient denies having dental phobia. Patient is interested in having all remaining teeth extracted at this time.  "I want them all pulled out ".   PROBLEM LIST: Patient Active Problem List   Diagnosis Date Noted  . Prostate carcinoma ([place]) 12/26/2015    Priority: High  . Bone metastases (Matthew [name]) 12/26/2015    Priority: Medium  . Burn 01/14/2016    PMH: Past Medical History  Diagnosis Date  . DVT (deep venous thrombosis) ([place]) 2004  . Arthritis     Left  knee  . Cancer [name])     Prostate  . Kidney stones     ~2002  . Prostate carcinoma (Matthew [name]) 12/26/2015  . Bone metastases (Matthew Matthew [name]) 12/26/2015     PSH: Past Surgical History  Procedure Laterality Date  . Circumcision  30 years ago    ALLERGIES: Allergies  Allergen Reactions  . Meloxicam Other (See Comments)    Caused stomach bleeding.    MEDICATIONS: Current Outpatient Prescriptions  Medication Sig Dispense Refill  . Calcium Carb-Cholecalciferol (CALCIUM 500 + D3) 500-600 MG-UNIT TABS Take 2 tablets by mouth daily. 60 tablet 6  . Degarelix Acetate (FIRMAGON [place]) Inject into the skin every 28 (twenty-eight) days. Reported on 12/28/2015    . denosumab (XGEVA) 120 MG/1.7ML SOLN injection Inject 120 mg into the skin every 28 (twenty-eight) days.    Marland Kitchen dexamethasone (DECADRON) 4 MG tablet The day before, day of, and day after chemo take 2 tabs in am and 2 tabs in pm. Take with food. 36 tablet 1  . DOCEtaxel (TAXOTERE IV) Inject into the vein. Has not started yet, to be given every 21 days    . ergocalciferol (VITAMIN D2) 50000 units capsule Take 1 capsule (50,000 Units total) by mouth once a week. 4 capsule 6  . fentaNYL (DURAGESIC - DOSED MCG/HR) 25 MCG/HR patch Place 1 patch (25 mcg total) onto the skin every 3 (three) days. May change to 2 patches after 3 days if needed 10 patch 0  . HYDROcodone-acetaminophen (NORCO) 10-325 MG tablet Take 1 tablet by mouth every 4 (four) hours as needed. 60 tablet 0  . lidocaine-prilocaine (EMLA) cream Apply  a quarter size amount to port site 1 hour prior to chemo. Do not rub in. Cover with plastic wrap. 30 g 3  . ondansetron (ZOFRAN) 8 MG tablet Take 1 tablet (8 mg total) by mouth every 8 (eight) hours as needed for nausea or vomiting. 30 tablet 2  . pegfilgrastim (NEULASTA ONPRO) 6 MG/0.6ML injection Inject 6 mg into the skin every 21 ( twenty-one) days. Inject via provided programmed delivery device.    . predniSONE (DELTASONE) 5 MG tablet Take 1 tablet (5  mg total) by mouth daily with breakfast. 60 tablet 1  . prochlorperazine (COMPAZINE) 10 MG tablet Take 1 tablet (10 mg total) by mouth every 6 (six) hours as needed for nausea or vomiting. 30 tablet 2  . silver sulfADIAZINE (SILVADENE) 1 % cream Apply 1 application topically 3 (three) times daily. 400 g 0  . aspirin EC 81 MG tablet Take 81 mg by mouth daily. Reported on 01/29/2016    . HYDROcodone-acetaminophen (NORCO) 10-325 MG tablet Take 1 tablet by mouth every 6 (six) hours as needed. (Patient not taking: Reported on 01/29/2016) 90 tablet 0  . ibuprofen (ADVIL,MOTRIN) 600 MG tablet Take 300 mg by mouth 2 (two) times daily as needed for moderate pain. Reported on 01/29/2016    . Liniments (BLUE-EMU SUPER STRENGTH EX) Apply 1 application topically 4 (four) times daily as needed (knee/hip pain). Reported on 01/29/2016     No current facility-administered medications for this visit.     LABS: Lab Results  Component Value Date   WBC 8.7 01/28/2016   HGB 13.1 01/28/2016   HCT 39.3 01/28/2016   MCV 87.5 01/28/2016   PLT 329 01/28/2016      Component Value Date/Time   NA 136 01/28/2016 0915   K 4.3 01/28/2016 0915   CL 106 01/28/2016 0915   CO2 24 01/28/2016 0915   GLUCOSE 197* 01/28/2016 0915   BUN 14 01/28/2016 0915   CREATININE 0.80 01/28/2016 0915   CALCIUM 8.3* 01/28/2016 0915   GFRNONAA >60 01/28/2016 0915   GFRAA >60 01/28/2016 0915   Lab Results  Component Value Date   INR 0.96 01/01/2016   INR 1.04 12/24/2015   No results found for: PTT  SOCIAL HISTORY: Social History   Social History  . Marital Status: Single    Spouse Name: N/A  . Number of Children: 2  . Years of Education: N/A   Occupational History  . Not on file.   Social History Main Topics  . Smoking status: Current Every Day Smoker -- 1.00 packs/day    Types: Cigarettes  . Smokeless tobacco: Former Systems developer    Types: Chew    Quit date: 08/04/2005  . Alcohol Use: 0.0 oz/week    0 Standard drinks or  equivalent per week     Comment: occ'l  . Drug Use: Yes     Comment: THC for appetite  . Sexual Activity: Not on file   Other Topics Concern  . Not on file   Social History Narrative     FAMILY HISTORY: Family History  Problem Relation Age of Onset  . Cancer Mother     REVIEW OF SYSTEMS: Reviewed with patient as per History of present illness. Psych: Patient denies dental phobia.  DENTAL HISTORY: CHIEF COMPLAINT: Patient referred by Dr. Whitney Muse for a dental consultation.  HPI: BAIRON WOLTHUIS is a 60 year old male recently diagnosed with prostate cancer with metastasis to bone. Patient has had 1 dose of IV Xgeva therapy. Patient now  seen to evaluate poor dentition and to provide treatment with discussion of potential risk for osteonecrosis of the jaw with invasive dental procedures related to previous Xgeva therapy.  The patient currently denies acute toothaches, swellings, or abscesses. Patient was last seen in May 2017 by Dr. Gerald Stabs as part of a pre-Xgeva therapy dental protocol examination. Patient was scheduled for extraction of remaining teeth with alveoloplasty and pre-prosthetic surgery as needed. Due to concerns with his dental insurance coverage, the patient was subsequently referred to dental medicine to evaluate patient did discuss treatment options in relationship to the risks, benefits and potential complications.   The patient was previously seen for dental treatment approximately 3-4 years ago with Dr. Ahmed Prima to have several teeth extracted. There were no complications with those extraction procedures. The patient does not seek regular dental care. Patient denies having any partial dentures. Patient denies having dental phobia. Patient is interested in having all remaining teeth extracted at this time.  "I want them all pulled out ".  DENTAL EXAMINATION: GENERAL:The patient is a well-developed, well-nourished male in no acute distress. HEAD AND NECK: There is  no palpable submandibular lymphadenopathy. Patient denies acute TMJ symptoms. Patient has a maximum interincisal opening of 45 mm. INTRAORAL EXAM: The patient has normal saliva. Patient with bilateral mandibular lingual tori.  There is no evidence of oral abscess formation. DENTITION: Patient is missing tooth numbers 1, 2, 4, 7, 13-19, 23-26, 29,30,32.  There is a retained root segment in the area of tooth #21.  PERIODONTAL: Patient has chronic periodontal disease with plaque and calculus accumulations, generalized gingival recession, and tooth mobility as charted. There is moderate to severe bone loss. DENTAL CARIES/SUBOPTIMAL RESTORATIONS: There are multiple dental caries noted as per dental charting form. There is evidence of maxillary and mandibular incisal attrition. ENDODONTIC: Patient currently denies acute pulpitis symptoms. The patient has multiple areas of periapical pathology involving tooth numbers 12 and 21. CROWN AND BRIDGE: Patient has a PFM crown on tooth #6 with PFM pontic attached involving #7. PROSTHODONTIC: Patient denies having any partial dentures. OCCLUSION: Patient has a poor occlusal scheme secondary to multiple missing teeth, supra-eruption and drifting of the unopposed teeth into the edentulous areas, and lack of replacement of missing teeth with dental prostheses.  RADIOGRAPHIC INTERPRETATION: An orthopantogram was taken on 01/29/2016 and previous full series from Dr. Ahmed Prima was reviewed from 01/08/2016. There are multiple missing teeth. There is moderate to severe bone loss. There is supra-eruption and drifting of the unopposed teeth into the edentulous areas. There is a retained root segment in the area #21. There is periapical pathology associated with the apices of tooth numbers 12 and 21. Multiple dental caries are noted. There is a radiopaque area at the previous apices area of numbers 7 consistent with probable amalgam tattoo.  There is an earring in the left ear with  superimposition of this earring in the area of the the right maxillary sinus.   ASSESSMENTS: 1. Prostate cancer with metastasis to bone 2. History of one dose of Xgeva with the the risk for osteonecrosis of the jaw with anticipated invasive dental procedures 3. Chronic apical periodontitis 4. Retained root segment #21 5. Dental caries 6. Chronic periodontitis with bone loss 7. Gingival recession 8. Accretions 9. Tooth mobility 10. Supra-eruption and drifting of the unopposed teeth into the edentulous areas 11. Multiple missing teeth 12. No history of partial dentures 13. Poor occlusal scheme with malocclusion 14. Radiopaque area #7 probably due to an amalgam tattoo 15.  Risk for osteonecrosis of jaw with anticipated invasive dental procedures  PLAN/RECOMMENDATIONS: 1. I discussed the risks, benefits, and complications of various treatment options with the patient in relationship to his medical and dental conditions, current chemotherapy, and history of one dose of IV Xgeva therapy and the risk for osteonecrosis of the jaw with anticipated invasive dental procedures. We discussed various treatment options to include no treatment, multiple extractions with alveoloplasty, pre-prosthetic surgery as indicated, periodontal therapy, dental restorations, root canal therapy, crown and bridge therapy, implant therapy, and replacement of missing teeth as indicated. The patient currently wishes to proceed with extraction of remaining teeth with alveoloplasty and pre-prosthetic surgery as needed in the operating room with general anesthesia. Patient understands that we will need to have approximately 2 months of time between the last Xgeva therapy dose and the anticipated oral surgical procedures to reduce the risk for osteonecrosis of the jaw. Patient also is aware that the dental treatment will need to be time between the current schedule of chemotherapy. Therefore, the surgery date will be approximately  August 2 or March 06, 2016 prior to the anticipated chemotherapy dose on 03/10/2016.  The exact date of treatment will be determined after further discussion with Dr. Whitney Muse in the medical oncology team.    2. Discussion of findings with medical team and coordination of future medical and dental care as needed.  I spent in excess of  120 minutes during the conduct of this consultation and >50% of this time involved direct face-to-face encounter for counseling and/or coordination of the patient's care.    Lenn Cal, DDS

## 2016-01-31 ENCOUNTER — Other Ambulatory Visit (HOSPITAL_COMMUNITY): Payer: Self-pay | Admitting: Dentistry

## 2016-02-18 ENCOUNTER — Inpatient Hospital Stay (HOSPITAL_COMMUNITY): Payer: 59

## 2016-02-18 ENCOUNTER — Encounter (HOSPITAL_COMMUNITY): Payer: PRIVATE HEALTH INSURANCE | Attending: Hematology & Oncology | Admitting: Hematology & Oncology

## 2016-02-18 ENCOUNTER — Encounter (HOSPITAL_COMMUNITY): Payer: Self-pay | Admitting: Hematology & Oncology

## 2016-02-18 ENCOUNTER — Encounter (HOSPITAL_BASED_OUTPATIENT_CLINIC_OR_DEPARTMENT_OTHER): Payer: PRIVATE HEALTH INSURANCE

## 2016-02-18 VITALS — BP 109/47 | HR 79 | Temp 98.4°F | Resp 18

## 2016-02-18 VITALS — BP 136/76 | HR 76 | Temp 97.9°F | Resp 18 | Wt 239.8 lb

## 2016-02-18 DIAGNOSIS — F17210 Nicotine dependence, cigarettes, uncomplicated: Secondary | ICD-10-CM | POA: Insufficient documentation

## 2016-02-18 DIAGNOSIS — C7951 Secondary malignant neoplasm of bone: Secondary | ICD-10-CM

## 2016-02-18 DIAGNOSIS — I82409 Acute embolism and thrombosis of unspecified deep veins of unspecified lower extremity: Secondary | ICD-10-CM | POA: Insufficient documentation

## 2016-02-18 DIAGNOSIS — K30 Functional dyspepsia: Secondary | ICD-10-CM

## 2016-02-18 DIAGNOSIS — R972 Elevated prostate specific antigen [PSA]: Secondary | ICD-10-CM | POA: Diagnosis not present

## 2016-02-18 DIAGNOSIS — Z5111 Encounter for antineoplastic chemotherapy: Secondary | ICD-10-CM

## 2016-02-18 DIAGNOSIS — C61 Malignant neoplasm of prostate: Secondary | ICD-10-CM | POA: Diagnosis not present

## 2016-02-18 DIAGNOSIS — M1990 Unspecified osteoarthritis, unspecified site: Secondary | ICD-10-CM | POA: Diagnosis not present

## 2016-02-18 DIAGNOSIS — R3911 Hesitancy of micturition: Secondary | ICD-10-CM | POA: Insufficient documentation

## 2016-02-18 DIAGNOSIS — Z7982 Long term (current) use of aspirin: Secondary | ICD-10-CM | POA: Insufficient documentation

## 2016-02-18 DIAGNOSIS — Z79899 Other long term (current) drug therapy: Secondary | ICD-10-CM | POA: Diagnosis not present

## 2016-02-18 DIAGNOSIS — R634 Abnormal weight loss: Secondary | ICD-10-CM | POA: Diagnosis not present

## 2016-02-18 LAB — COMPREHENSIVE METABOLIC PANEL
ALK PHOS: 172 U/L — AB (ref 38–126)
ALT: 20 U/L (ref 17–63)
ANION GAP: 6 (ref 5–15)
AST: 17 U/L (ref 15–41)
Albumin: 3.8 g/dL (ref 3.5–5.0)
BUN: 14 mg/dL (ref 6–20)
CALCIUM: 9 mg/dL (ref 8.9–10.3)
CO2: 23 mmol/L (ref 22–32)
Chloride: 105 mmol/L (ref 101–111)
Creatinine, Ser: 0.76 mg/dL (ref 0.61–1.24)
GFR calc non Af Amer: >60 mL/min (ref >60)
Glucose, Bld: 143 mg/dL — ABNORMAL HIGH (ref 65–99)
POTASSIUM: 4.6 mmol/L (ref 3.5–5.1)
SODIUM: 134 mmol/L — AB (ref 135–145)
TOTAL PROTEIN: 6.8 g/dL (ref 6.5–8.1)
Total Bilirubin: 0.5 mg/dL (ref 0.3–1.2)

## 2016-02-18 LAB — CBC WITH DIFFERENTIAL/PLATELET
BASOS PCT: 0 %
Basophils Absolute: 0 10*3/uL (ref 0.0–0.1)
EOS ABS: 0 10*3/uL (ref 0.0–0.7)
EOS PCT: 0 %
HCT: 37.9 % — ABNORMAL LOW (ref 39.0–52.0)
HEMOGLOBIN: 12.8 g/dL — AB (ref 13.0–17.0)
LYMPHS ABS: 0.8 10*3/uL (ref 0.7–4.0)
Lymphocytes Relative: 10 %
MCH: 29.8 pg (ref 26.0–34.0)
MCHC: 33.8 g/dL (ref 30.0–36.0)
MCV: 88.1 fL (ref 78.0–100.0)
MONO ABS: 0.5 10*3/uL (ref 0.1–1.0)
MONOS PCT: 5 %
Neutro Abs: 7.3 10*3/uL (ref 1.7–7.7)
Neutrophils Relative %: 85 %
PLATELETS: 256 10*3/uL (ref 150–400)
RBC: 4.3 MIL/uL (ref 4.22–5.81)
RDW: 16.1 % — AB (ref 11.5–15.5)
WBC: 8.6 10*3/uL (ref 4.0–10.5)

## 2016-02-18 LAB — PSA: PSA: 12.57 ng/mL — ABNORMAL HIGH (ref 0.00–4.00)

## 2016-02-18 MED ORDER — SODIUM CHLORIDE 0.9 % IV SOLN
Freq: Once | INTRAVENOUS | Status: AC
  Administered 2016-02-18: 11:00:00 via INTRAVENOUS
  Filled 2016-02-18: qty 4

## 2016-02-18 MED ORDER — LORAZEPAM 2 MG/ML IJ SOLN
0.5000 mg | Freq: Once | INTRAMUSCULAR | Status: AC
  Administered 2016-02-18: 0.5 mg via INTRAVENOUS

## 2016-02-18 MED ORDER — OMEPRAZOLE 40 MG PO CPDR: 40.0000 mg | DELAYED_RELEASE_CAPSULE | Freq: Every day | ORAL | Status: DC

## 2016-02-18 MED ORDER — HYDROCODONE-ACETAMINOPHEN 10-325 MG PO TABS: 1.0000 | ORAL_TABLET | ORAL | Status: DC | PRN

## 2016-02-18 MED ORDER — SODIUM CHLORIDE 0.9 % IV SOLN
Freq: Once | INTRAVENOUS | Status: AC
  Administered 2016-02-18: 11:00:00 via INTRAVENOUS

## 2016-02-18 MED ORDER — PEGFILGRASTIM 6 MG/0.6ML ~~LOC~~ PSKT
6.0000 mg | PREFILLED_SYRINGE | Freq: Once | SUBCUTANEOUS | Status: AC
  Administered 2016-02-18: 6 mg via SUBCUTANEOUS

## 2016-02-18 MED ORDER — PEGFILGRASTIM 6 MG/0.6ML ~~LOC~~ PSKT
PREFILLED_SYRINGE | SUBCUTANEOUS | Status: AC
Start: 2016-02-18 — End: 2016-02-18
  Filled 2016-02-18: qty 0.6

## 2016-02-18 MED ORDER — SODIUM CHLORIDE 0.9% FLUSH: 10.0000 mL | INTRAVENOUS | Status: DC | PRN

## 2016-02-18 MED ORDER — LORAZEPAM 2 MG/ML IJ SOLN
INTRAMUSCULAR | Status: AC
  Filled 2016-02-18: qty 1

## 2016-02-18 MED ORDER — DOCETAXEL CHEMO INJECTION 160 MG/16ML
60.0000 mg/m2 | Freq: Once | INTRAVENOUS | Status: AC
  Administered 2016-02-18: 140 mg via INTRAVENOUS
  Filled 2016-02-18: qty 8

## 2016-02-18 MED ORDER — HEPARIN SOD (PORK) LOCK FLUSH 100 UNIT/ML IV SOLN
500.0000 [IU] | Freq: Once | INTRAVENOUS | Status: AC | PRN
  Administered 2016-02-18: 500 [IU]

## 2016-02-18 MED ORDER — FENTANYL 25 MCG/HR TD PT72: 25.0000 ug | MEDICATED_PATCH | TRANSDERMAL | Status: DC

## 2016-02-18 NOTE — Patient Instructions (Signed)
[facility] Discharge Instructions for Patients Receiving Chemotherapy   Beginning January 23rd 2017 lab work for the Cancer Center will be done in the  Main lab at [facility] on 1st floor. If you have a lab appointment with the Cancer Center please come in thru the  Main Entrance and check in at the main information desk   Today you received the following chemotherapy agents:  Taxotere  If you develop nausea and vomiting, or diarrhea that is not controlled by your medication, call the clinic.  The clinic phone number is [phone]. Office hours are Monday-Friday 8:30am-5:00pm.  BELOW ARE SYMPTOMS THAT SHOULD BE REPORTED IMMEDIATELY:  *FEVER GREATER THAN 101.0 F  *CHILLS WITH OR WITHOUT FEVER  NAUSEA AND VOMITING THAT IS NOT CONTROLLED WITH YOUR NAUSEA MEDICATION  *UNUSUAL SHORTNESS OF BREATH  *UNUSUAL BRUISING OR BLEEDING  TENDERNESS IN MOUTH AND THROAT WITH OR WITHOUT PRESENCE OF ULCERS  *URINARY PROBLEMS  *BOWEL PROBLEMS  UNUSUAL RASH Items with * indicate a potential emergency and should be followed up as soon as possible. If you have an emergency after office hours please contact your primary care physician or go to the nearest emergency department.  Please call the clinic during office hours if you have any questions or concerns.   You may also contact the Patient Navigator at [phone] should you have any questions or need assistance in obtaining follow up care.      Resources For Cancer Patients and their Caregivers ? American Cancer Society: Can assist with transportation, wigs, general needs, runs Look Good Feel Better.        1-888-227-6333 ? Cancer Care: Provides financial assistance, online support groups, medication/co-pay assistance.  1-800-813-HOPE (4673) ? [facility] Assists [place] cancer patients and their families through emotional , educational and financial support.   336-427-4357 ? [place] DSS Where to apply for food stamps, Medicaid and utility assistance. 336-342-1394 ? RCATS: Transportation to medical appointments. 336-347-2287 ? Social Security Administration: May apply for disability if have a Stage IV cancer. 336-342-7796 1-800-772-1213 ? [place] Aging, Disability and Transit Services: Assists with nutrition, care and transit needs. 336-349-2343         

## 2016-02-18 NOTE — Patient Instructions (Signed)
[place] at [facility] Discharge Instructions  RECOMMENDATIONS MADE BY THE CONSULTANT AND ANY TEST RESULTS WILL BE SENT TO YOUR REFERRING PHYSICIAN.  You were seen by Dr. Whitney Muse today Return to clinic in 3 weeks for chemo and labs. Call clinic with any concerns.  Thank you for choosing [facility] [place] [facility] to provide your oncology and hematology care.  To afford each patient quality time with our provider, please arrive at least 15 minutes before your scheduled appointment time.   Beginning January 23rd 2017 lab work for the [facility] will be done in the  Main lab at [facility] on 1st floor. If you have a lab appointment with the [facility] please come in thru the  Main Entrance and check in at the main information desk  You need to re-schedule your appointment should you arrive 10 or more minutes late.  We strive to give you quality time with our providers, and arriving late affects you and other patients whose appointments are after yours.  Also, if you no show three or more times for appointments you may be dismissed from the clinic at the providers discretion.     Again, thank you for choosing [facility].  Our hope is that these requests will decrease the amount of time that you wait before being seen by our physicians.       _____________________________________________________________  Should you have questions after your visit to [facility], please contact our office at [phone] between the hours of 8:30 a.m. and 4:30 p.m.  Voicemails left after 4:30 p.m. will not be returned until the following business day.  For prescription refill requests, have your pharmacy contact our office.         Resources For Cancer Patients and their Caregivers ? American Cancer Society: Can assist with transportation, wigs, general needs, runs Look Good Feel Better.        838-377-1389 ? Cancer  Care: Provides financial assistance, online support groups, medication/co-pay assistance.  1-800-813-HOPE 931-696-0364) ? [facility] Assists [place] cancer patients and their families through emotional , educational and financial support.  787-293-0419 ? [place] DSS Where to apply for food stamps, Medicaid and utility assistance. (910)189-3077 ? RCATS: Transportation to medical appointments. 607-096-3875 ? Social Security Administration: May apply for disability if have a Stage IV cancer. 760-882-9813 445-857-0021 ? [employer], Disability and Transit Services: Assists with nutrition, care and transit needs. [place] Support Programs: @10RELATIVEDAYS @ > Cancer Support Group  2nd Tuesday of the month 1pm-2pm, Journey Room  > Creative Journey  3rd Tuesday of the month 1130am-1pm, Journey Room  > Look Good Feel Better  1st Wednesday of the month 10am-12 noon, Journey Room (Call [facility] to register [phone])

## 2016-02-18 NOTE — Progress Notes (Signed)
Patient tolerated infusion well.  VSS.  Patient understands the directions on OnPro injector.

## 2016-02-18 NOTE — Progress Notes (Signed)
[name]  PROGRESS NOTE  Patient Care Team: [name], MD as PCP - General (Family Medicine)  CHIEF COMPLAINTS:  CT angiography chest w/contrast 12/10/2015 multiple thoracic and rib osseous lesions suggesting osseous metastatic disease. No obvious primary PSA 12/12/2015 153.85    Prostate carcinoma ([place])   12/10/2015 Imaging CTA chest, multiple thoracic and rib osseous lesions. no primary evident. Atherosclerosis including aortic and CAD   12/12/2015 Imaging CT abdomen/pelvis with sclerotic osseous lesions througout the lumbar spine, sacrum, bilateral iliac bones and L proximal femur worrisome for sclerotic osseous mets,mild prostatomegaly, no LAD, Infrarenal 3.8 cm AAA   12/12/2015 Tumor Marker PSA 153.85   12/18/2015 Imaging Bone Scan Diffuse bony metastatic disease, posterior calvarium, sternum, thoracic, lumbar spine, bilateral ribs, L shoulder, bilateral bony pelvis, proximal L femur   12/24/2015 Initial Biopsy CT biopsy of L iliac bone lesion   12/26/2015 Pathology Results Bone, biopsy, left iliac - POSITIVE FOR METASTATIC ADENOCARCINOMA.   12/28/2015 -  Chemotherapy Firmagon instituted 240 mg    01/01/2016 Procedure Port placed by IR.   01/07/2016 -  Chemotherapy Docetaxel every 21 days   02/18/2016 Adverse Reaction GI toxicity from docetaxel, dose reduced to 60 mg/m2     HISTORY OF PRESENTING ILLNESS:  Matthew [name] 60 y.o. male is here for further follow-up of stage IV prostate cancer. He is on firmagon (which he notes drastically improved his pain) taxotere and prednisone.   Matthew [name] is accompanied by his daughter and here for Cycle #3 Docetaxel.  He complains of bad, bad abdominal pain and intestinal cramps. He was feeling really bad intermittent abdominal pain for around 2 weeks. He would experience diarrhea with this. The pain got so intense that he could not even pick up a gallon of milk. He had to increase his pain medication to deal with this pain. He only takes  them unless he has to. The last three days his stomach has quit feeling like this and felt good. He felt his best day was yesterday. He was able to get outside and do things. His daughter reports his main issue is the stomach pain.   Reports daily nausea, though he is not taking his anti-nausea medication regularly. He has experienced three episodes of vomiting since our last visit. He explains one of these was due to acid reflux after drinking too much coffee.  Reports his bowels are irregular and alternate between constipation, normal stool, and diarrhea. Diarrhea was significant after the first week of treatment. He did use immodium. He did not call the clinic.   He has also been dealing with dizziness. He has dealt with this dizziness before, however it has worsened with treatment. He has been drinking gatorade, tea, and water, as well as coffee. His daughter notes he has been taking in plenty of fluids and even eats popsicles.   He has been swishing listerine and salt rinses when he had what was presumed to be thrush on his tongue and in his throat. This affected his taste. Admits he was "hard-headed" and did not reach out to the clinic because he believed he could deal with it himself. His daughter purchased biotene when he complained of dry mouth and also encouraged him to eat yogurt. After starting the biotene and eating yogurt, the thrush resolved.  His daughter reports he has acid reflux as well. He took one antiacid which resolved the reflux.   He is scheduled to have teeth pulled on August 3rd with Dr.  Kulinski.  He notes that for the last 3 days he feels great. He remarks that the abdominal cramping was present after his first cycle however after cycle #2 it significantly worsened in duration and severity.     MEDICAL HISTORY:  Past Medical History  Diagnosis Date  . DVT (deep venous thrombosis) ([place]) 2004  . Arthritis     Left knee  . Cancer [facility])     Prostate  . Kidney stones      ~2002  . Prostate carcinoma ([place]) 12/26/2015  . Bone metastases ([place]) 12/26/2015    SURGICAL HISTORY: Past Surgical History  Procedure Laterality Date  . Circumcision  30 years ago    SOCIAL HISTORY: Social History   Social History  . Marital Status: Single    Spouse Name: N/A  . Number of Children: 2  . Years of Education: N/A   Occupational History  . Construction/carpenter    Social History Main Topics  . Smoking status: Current Every Day Smoker -- 1.00 packs/day for 50 years    Types: Cigarettes  . Smokeless tobacco: Former Systems developer    Types: Chew    Quit date: 08/04/2005  . Alcohol Use: 0.0 oz/week    0 Standard drinks or equivalent per week     Comment: occ'l  . Drug Use: Yes     Comment: THC for appetite  . Sexual Activity: Not on file   Other Topics Concern  . Not on file   Social History Narrative  Divorced for 32 years 2 girls 4 grandchildren Smokes pack a day; Started at 60 years old Beer every once in a while  Hobbies-plays pool Works in Architect  FAMILY HISTORY: Family History  Problem Relation Age of Onset  . Cancer Mother     Cory Roughen (adopted him) Mother died in 60s from bone cancer Never met father Brothers and sisters all died from cancer 3 brother 3 sisters   ALLERGIES:  is allergic to meloxicam.  MEDICATIONS:  Current Outpatient Prescriptions  Medication Sig Dispense Refill  . Calcium Carb-Cholecalciferol (CALCIUM 500 + D3) 500-600 MG-UNIT TABS Take 2 tablets by mouth daily. 60 tablet 6  . Degarelix Acetate (FIRMAGON [place]) Inject into the skin every 28 (twenty-eight) days. Reported on 12/28/2015    . denosumab (XGEVA) 120 MG/1.7ML SOLN injection Inject 120 mg into the skin every 28 (twenty-eight) days.    Marland Kitchen dexamethasone (DECADRON) 4 MG tablet The day before, day of, and day after chemo take 2 tabs in am and 2 tabs in pm. Take with food. 36 tablet 1  . DOCEtaxel (TAXOTERE IV) Inject into the vein. Has not started yet, to be  given every 21 days    . ergocalciferol (VITAMIN D2) 50000 units capsule Take 1 capsule (50,000 Units total) by mouth once a week. 4 capsule 6  . fentaNYL (DURAGESIC - DOSED MCG/HR) 25 MCG/HR patch Place 1 patch (25 mcg total) onto the skin every 3 (three) days. May change to 2 patches after 3 days if needed 10 patch 0  . HYDROcodone-acetaminophen (NORCO) 10-325 MG tablet Take 1 tablet by mouth every 6 (six) hours as needed. 90 tablet 0  . HYDROcodone-acetaminophen (NORCO) 10-325 MG tablet Take 1 tablet by mouth every 4 (four) hours as needed. 60 tablet 0  . ibuprofen (ADVIL,MOTRIN) 600 MG tablet Take 300 mg by mouth 2 (two) times daily as needed for moderate pain. Reported on 01/29/2016    . lidocaine-prilocaine (EMLA) cream Apply a quarter size amount to port  site 1 hour prior to chemo. Do not rub in. Cover with plastic wrap. 30 g 3  . ondansetron (ZOFRAN) 8 MG tablet Take 1 tablet (8 mg total) by mouth every 8 (eight) hours as needed for nausea or vomiting. 30 tablet 2  . pegfilgrastim (NEULASTA ONPRO) 6 MG/0.6ML injection Inject 6 mg into the skin every 21 ( twenty-one) days. Inject via provided programmed delivery device.    . predniSONE (DELTASONE) 5 MG tablet Take 1 tablet (5 mg total) by mouth daily with breakfast. 60 tablet 1  . prochlorperazine (COMPAZINE) 10 MG tablet Take 1 tablet (10 mg total) by mouth every 6 (six) hours as needed for nausea or vomiting. 30 tablet 2  . silver sulfADIAZINE (SILVADENE) 1 % cream Apply 1 application topically 3 (three) times daily. 400 g 0  . aspirin EC 81 MG tablet Take 81 mg by mouth daily. Reported on 02/18/2016    . Liniments (BLUE-EMU SUPER STRENGTH EX) Apply 1 application topically 4 (four) times daily as needed (knee/hip pain). Reported on 02/18/2016    . omeprazole (PRILOSEC) 40 MG capsule Take 1 capsule (40 mg total) by mouth daily. 30 capsule 3   No current facility-administered medications for this visit.    Review of Systems  HENT: Negative.     Eyes: Negative.   Respiratory: Negative.   Cardiovascular: Positive for chest pain.       Occasional chest cramping  Gastrointestinal: Positive for nausea, vomiting, abdominal pain, diarrhea and constipation.       Intermittent abdominal pain / intestinal cramping for two weeks. Resolved over the last 3 days. Nausea almost daily, not taking antinausea regularly 3 episodes of vomiting since our last visit Irregular bowels.  Musculoskeletal: Positive for joint pain.       Hip pain   Skin: Negative.   Neurological: Positive for dizziness and tingling.       Worse tingling in feet. Tingling near his eyes associated with migraine headaches. Dizziness worse with treatment  Endo/Heme/Allergies: Negative.   Psychiatric/Behavioral: Negative.   All other systems reviewed and are negative. 14 point ROS was done and is otherwise as detailed above or in HPI   PHYSICAL EXAMINATION: ECOG PERFORMANCE STATUS: 1 - Symptomatic but completely ambulatory  Filed Vitals:   02/18/16 0944  BP: 136/76  Pulse: 76  Temp: 97.9 F (36.6 C)  Resp: 18   Filed Weights   02/18/16 0944  Weight: 239 lb 12.8 oz (108.773 kg)    Physical Exam  Constitutional: He is oriented to person, place, and time and well-developed, well-nourished, and in no distress.  New hair growth noted  HENT:  Head: Normocephalic and atraumatic.  Nose: Nose normal.  Mouth/Throat: Oropharynx is clear and moist. No oropharyngeal exudate.  Poor dentition with multiple missing teeth  Eyes: Conjunctivae and EOM are normal. Pupils are equal, round, and reactive to light. Right eye exhibits no discharge. Left eye exhibits no discharge. No scleral icterus.  Neck: Normal range of motion. Neck supple. No tracheal deviation present. No thyromegaly present.  Cardiovascular: Normal rate, regular rhythm and normal heart sounds.  Exam reveals no gallop and no friction rub.   No murmur heard. Pulmonary/Chest: Effort normal. He has no wheezes.  He has no rales.  Decreased throughout but clear  Abdominal: Soft. Bowel sounds are normal. He exhibits no distension and no mass. There is no tenderness. There is no rebound and no guarding.  Musculoskeletal: Normal range of motion. He exhibits no edema.  Lymphadenopathy:  He has no cervical adenopathy.  Neurological: He is alert and oriented to person, place, and time. He has normal reflexes. No cranial nerve deficit. Gait normal. Coordination normal.  Skin: Skin is warm and dry. No rash noted.  Psychiatric: Mood, memory, affect and judgment normal.  Nursing note and vitals reviewed.  LABORATORY DATA:  I have reviewed the data as listed  CBC    Component Value Date/Time   WBC 8.6 02/18/2016 0959   RBC 4.30 02/18/2016 0959   HGB 12.8* 02/18/2016 0959   HCT 37.9* 02/18/2016 0959   PLT 256 02/18/2016 0959   MCV 88.1 02/18/2016 0959   MCH 29.8 02/18/2016 0959   MCHC 33.8 02/18/2016 0959   RDW 16.1* 02/18/2016 0959   LYMPHSABS 0.8 02/18/2016 0959   MONOABS 0.5 02/18/2016 0959   EOSABS 0.0 02/18/2016 0959   BASOSABS 0.0 02/18/2016 0959   CMP     Component Value Date/Time   NA 134* 02/18/2016 0959   K 4.6 02/18/2016 0959   CL 105 02/18/2016 0959   CO2 23 02/18/2016 0959   GLUCOSE 143* 02/18/2016 0959   BUN 14 02/18/2016 0959   CREATININE 0.76 02/18/2016 0959   CALCIUM 9.0 02/18/2016 0959   PROT 6.8 02/18/2016 0959   ALBUMIN 3.8 02/18/2016 0959   AST 17 02/18/2016 0959   ALT 20 02/18/2016 0959   ALKPHOS 172* 02/18/2016 0959   BILITOT 0.5 02/18/2016 0959   GFRNONAA >60 02/18/2016 0959   GFRAA >60 02/18/2016 0959    Results for ISHAQ, MAFFEI (MRN [id number]) as of 01/14/2016 17:35  Ref. Range 12/12/2015 07:50 12/28/2015 09:15 01/07/2016 10:21  PSA Latest Ref Range: 0.00-4.00 ng/mL 153.85 (H) 159.00 (H) 89.94 (H)          RADIOGRAPHIC STUDIES: I have personally reviewed the radiological images as listed and agreed with the findings in the report.  Study Result      CLINICAL DATA: Prostate cancer metastatic to bone.  EXAM: LEFT FEMUR 2 VIEWS  COMPARISON: Nuclear medicine whole-body bone scan 12/18/2015. Plain films 09/04/2014.  FINDINGS: No acute bony abnormality. No focal bony lesion visualized. No abnormality seen by plain film to correspond to the area of abnormality within the proximal left femur on prior bone scan.  IMPRESSION: No acute bony abnormality.   Electronically Signed  By: Rolm Baptise M.D.  On: 12/28/2015 11:22     PATHOLOGY   ASSESSMENT & PLAN:  Stage IV adenocarcinoma of prostate, hormone sensitive disease Bony Metastatic Disease Abnormal CT imaging Elevated PSA Urinary hesitancy, frequency Tobacco Abuse Cancer related Pain Weight loss  We will proceed as follows:  The patient is here for Cycle #3 Docetaxel today. I have adjusted his dose due to significant GI symptoms.  The patient has been experiencing GI symptoms including increased acid reflux, abdominal pain / intestinal cramping, irregular bowels, nausea, and vomiting. He was instructed again to please call with problems after treatment so we can address his symptoms sooner.   I have refilled his pain medications. He was also prescribed a PPI to take daily during the remainder of his therapy.   He will return for follow up with his next cycle of treatment.   All questions were answered. The patient knows to call the clinic with any problems, questions or concerns.  This document serves as a record of services personally performed by Ancil Linsey, MD. It was created on her behalf by Arlyce Harman, a trained medical scribe. The creation of this record is based on the  scribe's personal observations and the provider's statements to them. This document has been checked and approved by the attending provider.  I have reviewed the above documentation for accuracy and completeness, and I agree with the above.  This note was electronically  signed.    Molli Hazard, MD  02/18/2016 8:03 PM

## 2016-02-19 MED ORDER — FENTANYL 50 MCG/HR TD PT72: 50.0000 ug | MEDICATED_PATCH | TRANSDERMAL | Status: DC

## 2016-02-22 ENCOUNTER — Encounter (HOSPITAL_BASED_OUTPATIENT_CLINIC_OR_DEPARTMENT_OTHER): Payer: PRIVATE HEALTH INSURANCE

## 2016-02-22 ENCOUNTER — Encounter (HOSPITAL_COMMUNITY): Payer: Self-pay

## 2016-02-22 VITALS — BP 105/65 | HR 91 | Temp 98.2°F | Resp 18

## 2016-02-22 DIAGNOSIS — Z5111 Encounter for antineoplastic chemotherapy: Secondary | ICD-10-CM | POA: Diagnosis not present

## 2016-02-22 DIAGNOSIS — C7951 Secondary malignant neoplasm of bone: Secondary | ICD-10-CM | POA: Diagnosis not present

## 2016-02-22 DIAGNOSIS — C61 Malignant neoplasm of prostate: Secondary | ICD-10-CM | POA: Diagnosis not present

## 2016-02-22 MED ORDER — DEGARELIX ACETATE 80 MG ~~LOC~~ SOLR
80.0000 mg | Freq: Once | SUBCUTANEOUS | Status: AC
  Administered 2016-02-22: 80 mg via SUBCUTANEOUS
  Filled 2016-02-22: qty 4

## 2016-02-22 NOTE — Patient Instructions (Signed)
[place] at [facility] Discharge Instructions  RECOMMENDATIONS MADE BY THE CONSULTANT AND ANY TEST RESULTS WILL BE SENT TO YOUR REFERRING PHYSICIAN.  Firmagon injection today. Magic Mouthwash called into your pharmacy. Return as scheduled for chemotherapy. Return as scheduled for office visit.   Thank you for choosing [place] at [facility] to provide your oncology and hematology care.  To afford each patient quality time with our provider, please arrive at least 15 minutes before your scheduled appointment time.   Beginning January 23rd 2017 lab work for the [facility] will be done in the  Main lab at [facility] on 1st floor. If you have a lab appointment with the [facility] please come in thru the  Main Entrance and check in at the main information desk  You need to re-schedule your appointment should you arrive 10 or more minutes late.  We strive to give you quality time with our providers, and arriving late affects you and other patients whose appointments are after yours.  Also, if you no show three or more times for appointments you may be dismissed from the clinic at the providers discretion.     Again, thank you for choosing [facility].  Our hope is that these requests will decrease the amount of time that you wait before being seen by our physicians.       _____________________________________________________________  Should you have questions after your visit to [facility], please contact our office at [phone] between the hours of 8:30 a.m. and 4:30 p.m.  Voicemails left after 4:30 p.m. will not be returned until the following business day.  For prescription refill requests, have your pharmacy contact our office.         Resources For Cancer Patients and their Caregivers ? American Cancer Society: Can assist with transportation, wigs, general needs, runs Look Good Feel Better.         (352)292-1808 ? Cancer Care: Provides financial assistance, online support groups, medication/co-pay assistance.  1-800-813-HOPE (956)366-3354) ? [place] Assists [place] cancer patients and their families through emotional , educational and financial support.  551-267-8341 ? [place] DSS Where to apply for food stamps, Medicaid and utility assistance. (312)580-2651 ? [facility]: Transportation to medical appointments. 234-578-6205 ? Social Security Administration: May apply for disability if have a Stage IV cancer. 270-609-7467 873-130-1256 ? [employer], Disability and Transit Services: Assists with nutrition, care and transit needs. [place] Support Programs: @10RELATIVEDAYS @ > Cancer Support Group  2nd Tuesday of the month 1pm-2pm, Journey Room  > Creative Journey  3rd Tuesday of the month 1130am-1pm, Journey Room  > Look Good Feel Better  1st Wednesday of the month 10am-12 noon, Journey Room (Call [place] to register [phone])

## 2016-02-22 NOTE — Progress Notes (Signed)
1120:  Matthew [name]. Linke presents today for injection per the provider's orders.  Firmagon administration without incident; see MAR for injection details.  Patient tolerated procedure well and without incident.  No questions or complaints noted at this time.

## 2016-03-03 ENCOUNTER — Encounter (HOSPITAL_COMMUNITY): Payer: Self-pay

## 2016-03-03 ENCOUNTER — Other Ambulatory Visit (HOSPITAL_COMMUNITY): Payer: Self-pay | Admitting: *Deleted

## 2016-03-03 NOTE — Pre-Procedure Instructions (Signed)
Matthew [name]  03/03/2016      [facility] [place], [place] - [id number] [place] [address] Phone: [phone] Fax: [phone]    Your procedure is scheduled on August 3rd  Report to [place] at [place].  Call this number if you have problems the morning of surgery:  [phone]   Remember:  Do not eat food or drink liquids after midnight.   Take these medicines the morning of surgery with A SIP OF WATER hydrocodone-acetaminophen if needed, omeprazole (prilosec), ondansetron (zofran) if needed,   7 days prior to surgery STOP taking any Aspirin, Aleve, Naproxen, Ibuprofen, Motrin, Advil, Goody's, BC's, all herbal medications, fish oil, and all vitamins    Do not wear jewelry.  Do not wear lotions, powders, or colonge.  You may NOT wear deoderant.  Men may shave face and neck.  Do not bring valuables to the hospital.  [facility] is not responsible for any belongings or valuables.  Contacts, dentures or bridgework may not be worn into surgery.  Leave your suitcase in the car.  After surgery it may be brought to your room.  For patients admitted to the hospital, discharge time will be determined by your treatment team.  Patients discharged the day of surgery will not be allowed to drive home.    Special instructions:   [place]- Preparing For Surgery  Before surgery, you can play an important role. Because skin is not sterile, your skin needs to be as free of germs as possible. You can reduce the number of germs on your skin by washing with CHG (chlorahexidine gluconate) Soap before surgery.  CHG is an antiseptic cleaner which kills germs and bonds with the skin to continue killing germs even after washing.  Please do not use if you have an allergy to CHG or antibacterial soaps. If your skin becomes reddened/irritated stop using the CHG.  Do not shave (including legs and underarms) for at least 48  hours prior to first CHG shower. It is OK to shave your face.  Please follow these instructions carefully.   1. Shower the NIGHT BEFORE SURGERY and the MORNING OF SURGERY with CHG.   2. If you chose to wash your hair, wash your hair first as usual with your normal shampoo.  3. After you shampoo, rinse your hair and body thoroughly to remove the shampoo.  4. Use CHG as you would any other liquid soap. You can apply CHG directly to the skin and wash gently with a scrungie or a clean washcloth.   5. Apply the CHG Soap to your body ONLY FROM THE NECK DOWN.  Do not use on open wounds or open sores. Avoid contact with your eyes, ears, mouth and genitals (private parts). Wash genitals (private parts) with your normal soap.  6. Wash thoroughly, paying special attention to the area where your surgery will be performed.  7. Thoroughly rinse your body with warm water from the neck down.  8. DO NOT shower/wash with your normal soap after using and rinsing off the CHG Soap.  9. Pat yourself dry with a CLEAN TOWEL.   10. Wear CLEAN PAJAMAS   11. Place CLEAN SHEETS on your bed the night of your first shower and DO NOT SLEEP WITH PETS.    Day of Surgery: Do not apply any deodorants/lotions. Please wear clean clothes to the hospital/surgery center.      Please read  over the following fact sheets that you were given. Pain Booklet, Coughing and Deep Breathing and Surgical Site Infection Prevention

## 2016-03-04 ENCOUNTER — Encounter (HOSPITAL_COMMUNITY)
Admission: RE | Admit: 2016-03-04 | Discharge: 2016-03-04 | Disposition: A | Discharge status: Home / Self Care | Payer: 59 | Source: Ambulatory Visit | Attending: Dentistry | Admitting: Dentistry

## 2016-03-04 ENCOUNTER — Encounter (HOSPITAL_COMMUNITY): Payer: Self-pay

## 2016-03-04 DIAGNOSIS — K0889 Other specified disorders of teeth and supporting structures: Secondary | ICD-10-CM | POA: Diagnosis not present

## 2016-03-04 DIAGNOSIS — Z7982 Long term (current) use of aspirin: Secondary | ICD-10-CM | POA: Diagnosis not present

## 2016-03-04 DIAGNOSIS — Z7952 Long term (current) use of systemic steroids: Secondary | ICD-10-CM | POA: Diagnosis not present

## 2016-03-04 DIAGNOSIS — C7951 Secondary malignant neoplasm of bone: Secondary | ICD-10-CM | POA: Diagnosis not present

## 2016-03-04 DIAGNOSIS — K045 Chronic apical periodontitis: Secondary | ICD-10-CM | POA: Diagnosis present

## 2016-03-04 DIAGNOSIS — Z79899 Other long term (current) drug therapy: Secondary | ICD-10-CM | POA: Diagnosis not present

## 2016-03-04 DIAGNOSIS — K083 Retained dental root: Secondary | ICD-10-CM | POA: Diagnosis not present

## 2016-03-04 DIAGNOSIS — F17210 Nicotine dependence, cigarettes, uncomplicated: Secondary | ICD-10-CM | POA: Diagnosis not present

## 2016-03-04 DIAGNOSIS — C61 Malignant neoplasm of prostate: Secondary | ICD-10-CM | POA: Diagnosis not present

## 2016-03-04 DIAGNOSIS — M270 Developmental disorders of jaws: Secondary | ICD-10-CM | POA: Diagnosis not present

## 2016-03-04 DIAGNOSIS — K029 Dental caries, unspecified: Secondary | ICD-10-CM | POA: Diagnosis not present

## 2016-03-04 DIAGNOSIS — J449 Chronic obstructive pulmonary disease, unspecified: Secondary | ICD-10-CM | POA: Diagnosis not present

## 2016-03-04 LAB — BASIC METABOLIC PANEL
ANION GAP: 6 (ref 5–15)
BUN: 8 mg/dL (ref 6–20)
CO2: 23 mmol/L (ref 22–32)
CREATININE: 0.89 mg/dL (ref 0.61–1.24)
Calcium: 8.2 mg/dL — ABNORMAL LOW (ref 8.9–10.3)
Chloride: 109 mmol/L (ref 101–111)
GFR calc non Af Amer: >60 mL/min (ref >60)
GLUCOSE: 118 mg/dL — AB (ref 65–99)
POTASSIUM: 4.3 mmol/L (ref 3.5–5.1)
Sodium: 138 mmol/L (ref 135–145)

## 2016-03-04 LAB — CBC
HEMATOCRIT: 40.4 % (ref 39.0–52.0)
Hemoglobin: 12.7 g/dL — ABNORMAL LOW (ref 13.0–17.0)
MCH: 29.1 pg (ref 26.0–34.0)
MCHC: 31.4 g/dL (ref 30.0–36.0)
MCV: 92.4 fL (ref 78.0–100.0)
PLATELETS: 166 10*3/uL (ref 150–400)
RBC: 4.37 MIL/uL (ref 4.22–5.81)
RDW: 17.3 % — AB (ref 11.5–15.5)
WBC: 8 10*3/uL (ref 4.0–10.5)

## 2016-03-04 NOTE — Progress Notes (Addendum)
PCP - [name] Cardiologist - denies  Chest x-ray - not needed EKG - 12/10/15 Stress Test - denies ECHO -  >10 years Cardiac Cath - denies    Patient denies shortness of breath, fever, cough and chest pain at PAT appointment   Patient smokes marijuana occasionally to help with his eating since he was diagnosed with cancer.  Patient stated that his last smoke was sometime last week.  Instructed patient to reframed from smoking anymore until surgery

## 2016-03-05 MED ORDER — CEFAZOLIN SODIUM-DEXTROSE 2-4 GM/100ML-% IV SOLN
2.0000 g | INTRAVENOUS | Status: AC
  Administered 2016-03-06: 2 g via INTRAVENOUS
  Filled 2016-03-05: qty 100

## 2016-03-06 ENCOUNTER — Encounter (HOSPITAL_COMMUNITY)
Admission: RE | Disposition: A | Discharge status: Home / Self Care | Payer: Self-pay | Source: Ambulatory Visit | Attending: Dentistry

## 2016-03-06 ENCOUNTER — Encounter (HOSPITAL_COMMUNITY): Payer: Self-pay | Admitting: Urology

## 2016-03-06 ENCOUNTER — Ambulatory Visit (HOSPITAL_COMMUNITY)
Admission: RE | Admit: 2016-03-06 | Discharge: 2016-03-06 | Disposition: A | Discharge status: Home / Self Care | Payer: 59 | Source: Ambulatory Visit | Attending: Dentistry | Admitting: Dentistry

## 2016-03-06 ENCOUNTER — Ambulatory Visit (HOSPITAL_COMMUNITY): Payer: 59 | Admitting: Anesthesiology

## 2016-03-06 DIAGNOSIS — K0530 Chronic periodontitis, unspecified: Secondary | ICD-10-CM | POA: Diagnosis present

## 2016-03-06 DIAGNOSIS — K0889 Other specified disorders of teeth and supporting structures: Secondary | ICD-10-CM

## 2016-03-06 DIAGNOSIS — F17210 Nicotine dependence, cigarettes, uncomplicated: Secondary | ICD-10-CM | POA: Insufficient documentation

## 2016-03-06 DIAGNOSIS — K083 Retained dental root: Secondary | ICD-10-CM | POA: Diagnosis not present

## 2016-03-06 DIAGNOSIS — M270 Developmental disorders of jaws: Secondary | ICD-10-CM | POA: Diagnosis present

## 2016-03-06 DIAGNOSIS — C61 Malignant neoplasm of prostate: Secondary | ICD-10-CM | POA: Insufficient documentation

## 2016-03-06 DIAGNOSIS — K045 Chronic apical periodontitis: Secondary | ICD-10-CM | POA: Diagnosis not present

## 2016-03-06 DIAGNOSIS — Z7982 Long term (current) use of aspirin: Secondary | ICD-10-CM | POA: Insufficient documentation

## 2016-03-06 DIAGNOSIS — Z79899 Other long term (current) drug therapy: Secondary | ICD-10-CM | POA: Insufficient documentation

## 2016-03-06 DIAGNOSIS — C7951 Secondary malignant neoplasm of bone: Secondary | ICD-10-CM

## 2016-03-06 DIAGNOSIS — Z7952 Long term (current) use of systemic steroids: Secondary | ICD-10-CM | POA: Insufficient documentation

## 2016-03-06 DIAGNOSIS — K029 Dental caries, unspecified: Secondary | ICD-10-CM | POA: Insufficient documentation

## 2016-03-06 DIAGNOSIS — J449 Chronic obstructive pulmonary disease, unspecified: Secondary | ICD-10-CM | POA: Insufficient documentation

## 2016-03-06 HISTORY — PX: MULTIPLE EXTRACTIONS WITH ALVEOLOPLASTY: SHX5342

## 2016-03-06 SURGERY — MULTIPLE EXTRACTION WITH ALVEOLOPLASTY
Anesthesia: General | Site: Mouth

## 2016-03-06 MED ORDER — SUCCINYLCHOLINE CHLORIDE 200 MG/10ML IV SOSY
PREFILLED_SYRINGE | INTRAVENOUS | Status: AC
  Filled 2016-03-06: qty 10

## 2016-03-06 MED ORDER — ROCURONIUM BROMIDE 50 MG/5ML IV SOLN
INTRAVENOUS | Status: AC
  Filled 2016-03-06: qty 1

## 2016-03-06 MED ORDER — MIDAZOLAM HCL 5 MG/5ML IJ SOLN
INTRAMUSCULAR | Status: DC | PRN
  Administered 2016-03-06 (×2): 1 mg via INTRAVENOUS

## 2016-03-06 MED ORDER — NEOSTIGMINE METHYLSULFATE 5 MG/5ML IV SOSY
PREFILLED_SYRINGE | INTRAVENOUS | Status: AC
  Filled 2016-03-06: qty 5

## 2016-03-06 MED ORDER — HYDROMORPHONE HCL 1 MG/ML IJ SOLN: 0.2500 mg | INTRAMUSCULAR | Status: DC | PRN

## 2016-03-06 MED ORDER — BUPIVACAINE-EPINEPHRINE 0.5% -1:200000 IJ SOLN
INTRAMUSCULAR | Status: DC | PRN
  Administered 2016-03-06 (×2): 1.8 mL

## 2016-03-06 MED ORDER — LACTATED RINGERS IV SOLN
INTRAVENOUS | Status: DC | PRN
  Administered 2016-03-06 (×2): via INTRAVENOUS

## 2016-03-06 MED ORDER — NEOSTIGMINE METHYLSULFATE 10 MG/10ML IV SOLN
INTRAVENOUS | Status: DC | PRN
  Administered 2016-03-06: 4 mg via INTRAVENOUS

## 2016-03-06 MED ORDER — LIDOCAINE HCL (CARDIAC) 20 MG/ML IV SOLN
INTRAVENOUS | Status: DC | PRN
  Administered 2016-03-06: 100 mg via INTRAVENOUS

## 2016-03-06 MED ORDER — 0.9 % SODIUM CHLORIDE (POUR BTL) OPTIME
TOPICAL | Status: DC | PRN
  Administered 2016-03-06: 1000 mL

## 2016-03-06 MED ORDER — SUCCINYLCHOLINE CHLORIDE 20 MG/ML IJ SOLN
INTRAMUSCULAR | Status: DC | PRN
  Administered 2016-03-06: 120 mg via INTRAVENOUS

## 2016-03-06 MED ORDER — LIDOCAINE-EPINEPHRINE 2 %-1:100000 IJ SOLN
INTRAMUSCULAR | Status: AC
  Filled 2016-03-06: qty 10.2

## 2016-03-06 MED ORDER — MIDAZOLAM HCL 2 MG/2ML IJ SOLN
INTRAMUSCULAR | Status: AC
  Filled 2016-03-06: qty 2

## 2016-03-06 MED ORDER — BUPIVACAINE-EPINEPHRINE (PF) 0.5% -1:200000 IJ SOLN
INTRAMUSCULAR | Status: AC
  Filled 2016-03-06: qty 3.6

## 2016-03-06 MED ORDER — STERILE WATER FOR IRRIGATION IR SOLN
Status: DC | PRN
  Administered 2016-03-06: 1000 mL

## 2016-03-06 MED ORDER — PROPOFOL 10 MG/ML IV BOLUS
INTRAVENOUS | Status: DC | PRN
  Administered 2016-03-06: 180 mg via INTRAVENOUS
  Administered 2016-03-06: 50 mg via INTRAVENOUS

## 2016-03-06 MED ORDER — HEMOSTATIC AGENTS (NO CHARGE) OPTIME
TOPICAL | Status: DC | PRN
  Administered 2016-03-06: 1 via TOPICAL

## 2016-03-06 MED ORDER — FENTANYL CITRATE (PF) 100 MCG/2ML IJ SOLN
INTRAMUSCULAR | Status: DC | PRN
  Administered 2016-03-06: 50 ug via INTRAVENOUS
  Administered 2016-03-06: 75 ug via INTRAVENOUS
  Administered 2016-03-06: 25 ug via INTRAVENOUS
  Administered 2016-03-06: 50 ug via INTRAVENOUS
  Administered 2016-03-06 (×2): 25 ug via INTRAVENOUS

## 2016-03-06 MED ORDER — OXYMETAZOLINE HCL 0.05 % NA SOLN
1.0000 | NASAL | Status: AC
  Administered 2016-03-06 (×2): 2 via NASAL
  Filled 2016-03-06: qty 15

## 2016-03-06 MED ORDER — METOPROLOL TARTRATE 5 MG/5ML IV SOLN
INTRAVENOUS | Status: AC
  Filled 2016-03-06: qty 5

## 2016-03-06 MED ORDER — GLYCOPYRROLATE 0.2 MG/ML IJ SOLN
INTRAMUSCULAR | Status: DC | PRN
  Administered 2016-03-06: 0.6 mg via INTRAVENOUS

## 2016-03-06 MED ORDER — PROPOFOL 10 MG/ML IV BOLUS
INTRAVENOUS | Status: AC
  Filled 2016-03-06: qty 40

## 2016-03-06 MED ORDER — ONDANSETRON HCL 4 MG/2ML IJ SOLN
INTRAMUSCULAR | Status: DC | PRN
Start: 2016-03-06 — End: 2016-03-06
  Administered 2016-03-06: 4 mg via INTRAVENOUS

## 2016-03-06 MED ORDER — LIDOCAINE-EPINEPHRINE 2 %-1:100000 IJ SOLN
INTRAMUSCULAR | Status: DC | PRN
  Administered 2016-03-06 (×6): 1.7 mL via INTRADERMAL

## 2016-03-06 MED ORDER — DEXAMETHASONE SODIUM PHOSPHATE 10 MG/ML IJ SOLN
INTRAMUSCULAR | Status: AC
  Filled 2016-03-06: qty 1

## 2016-03-06 MED ORDER — FENTANYL CITRATE (PF) 250 MCG/5ML IJ SOLN
INTRAMUSCULAR | Status: AC
  Filled 2016-03-06: qty 5

## 2016-03-06 MED ORDER — LIDOCAINE 2% (20 MG/ML) 5 ML SYRINGE
INTRAMUSCULAR | Status: AC
  Filled 2016-03-06: qty 5

## 2016-03-06 MED ORDER — DEXAMETHASONE SODIUM PHOSPHATE 10 MG/ML IJ SOLN
INTRAMUSCULAR | Status: DC | PRN
  Administered 2016-03-06: 10 mg via INTRAVENOUS

## 2016-03-06 MED ORDER — ROCURONIUM BROMIDE 100 MG/10ML IV SOLN
INTRAVENOUS | Status: DC | PRN
  Administered 2016-03-06: 30 mg via INTRAVENOUS

## 2016-03-06 MED ORDER — ONDANSETRON HCL 4 MG/2ML IJ SOLN
INTRAMUSCULAR | Status: AC
  Filled 2016-03-06: qty 2

## 2016-03-06 MED ORDER — PROMETHAZINE HCL 25 MG/ML IJ SOLN: 6.2500 mg | INTRAMUSCULAR | Status: DC | PRN

## 2016-03-06 MED ORDER — GLYCOPYRROLATE 0.2 MG/ML IV SOSY
PREFILLED_SYRINGE | INTRAVENOUS | Status: AC
  Filled 2016-03-06: qty 3

## 2016-03-06 MED ORDER — PHENYLEPHRINE HCL 10 MG/ML IJ SOLN
INTRAMUSCULAR | Status: DC | PRN
  Administered 2016-03-06 (×5): 40 ug via INTRAVENOUS
  Administered 2016-03-06: 80 ug via INTRAVENOUS

## 2016-03-06 SURGICAL SUPPLY — 36 items

## 2016-03-06 NOTE — Progress Notes (Signed)
PRE-OPERATIVE NOTE:  03/06/2016 Matthew [name] [id number]  VITALS: BP 109/81   Pulse 80   Temp 97.9 F (36.6 C) (Oral)   Resp 18   Wt 239 lb (108.4 kg)   SpO2 100%   BMI 31.53 kg/m   Lab Results  Component Value Date   WBC 8.0 03/04/2016   HGB 12.7 (L) 03/04/2016   HCT 40.4 03/04/2016   MCV 92.4 03/04/2016   PLT 166 03/04/2016   BMET    Component Value Date/Time   NA 138 03/04/2016 0835   K 4.3 03/04/2016 0835   CL 109 03/04/2016 0835   CO2 23 03/04/2016 0835   GLUCOSE 118 (H) 03/04/2016 0835   BUN 8 03/04/2016 0835   CREATININE 0.89 03/04/2016 0835   CALCIUM 8.2 (L) 03/04/2016 0835   GFRNONAA >60 03/04/2016 0835   GFRAA >60 03/04/2016 0835    Lab Results  Component Value Date   INR 0.96 01/01/2016   INR 1.04 12/24/2015   No results found for: PTT   Matthew [name] presents for Multiple dental extractions with alveoloplasty and pre-prosthetic surgery as needed in the operating room with  general anesthesia.    SUBJECTIVE: The patient denies any acute medical or dental changes and agrees to proceed with treatment as planned.  EXAM: No sign of acute dental changes.  ASSESSMENT: Patient is affected by chronic apical periodontitis, multiple retained root segments, dental caries, chronic periodontitis, loose teeth, and bilateral mandibular lingual tori.  PLAN: Patient agrees to proceed with treatment as planned in the operating room as previously discussed and accepts the risks, benefits, and complications of the proposed treatment. Patient is aware of the risk for bleeding, bruising, swelling, infection, pain, nerve damage, soft tissue damage, sinus involvement, root tip fracture, mandible fracture, and the risks of complications associated with the anesthesia. Patient is aware of the risk for potential osteonecrosis of the jaws related to the previous IV dose of Xgeva therapy. Patient also is aware of the potential for other complications not mentioned  above.   Lenn Cal, DDS

## 2016-03-06 NOTE — H&P (Signed)
03/06/2016  Patient:            Matthew [name] Date of Birth:  Dec 24, 1955 MRN:                [id number]   BP 109/81   Pulse 80   Temp 97.9 F (36.6 C) (Oral)   Resp 18   Wt 239 lb (108.4 kg)   SpO2 100%   BMI 31.53 kg/m   Matthew [name] is a 60 year old male with history of prostate cancer with bone metastases. Patient now presents for extraction remaining teeth with alveoloplasty and pre-prosthetic surgery as needed in the operative room and general anesthesia. Patient denies any acute medical or dental changes. Please see note from Dr. Whitney [name] dated 02/18/2016 to act as the H&P for the dental operating room procedure.   Matthew [name], DDS  Progress Notes Encounter Date: 02/18/2016 Matthew Ranks, MD  Oncology  Expand All Collapse All     [place]  PROGRESS NOTE  Patient Care Team: [name], MD as PCP - General (Family Medicine)  CHIEF COMPLAINTS:  CT angiography chest w/contrast 12/10/2015 multiple thoracic and rib osseous lesions suggesting osseous metastatic disease. No obvious primary PSA 12/12/2015 153.85         Prostate carcinoma ([place])   12/10/2015 Imaging CTA chest, multiple thoracic and rib osseous lesions. no primary evident. Atherosclerosis including aortic and CAD   12/12/2015 Imaging CT abdomen/pelvis with sclerotic osseous lesions througout the lumbar spine, sacrum, bilateral iliac bones and L proximal femur worrisome for sclerotic osseous mets,mild prostatomegaly, no LAD, Infrarenal 3.8 cm AAA   12/12/2015 Tumor Marker PSA 153.85   12/18/2015 Imaging Bone Scan Diffuse bony metastatic disease, posterior calvarium, sternum, thoracic, lumbar spine, bilateral ribs, L shoulder, bilateral bony pelvis, proximal L femur   12/24/2015 Initial Biopsy CT biopsy of L iliac bone lesion   12/26/2015 Pathology Results Bone, biopsy, left iliac - POSITIVE FOR METASTATIC ADENOCARCINOMA.   12/28/2015 -  Chemotherapy Firmagon instituted 240 mg     01/01/2016 Procedure Port placed by IR.   01/07/2016 -  Chemotherapy Docetaxel every 21 days   02/18/2016 Adverse Reaction GI toxicity from docetaxel, dose reduced to 60 mg/m2     HISTORY OF PRESENTING ILLNESS:  Matthew [name] 60 y.o. male is here for further follow-up of stage IV prostate cancer. He is on firmagon (which he notes drastically improved his pain) taxotere and prednisone.   Matthew [name] is accompanied by his daughter and here for Cycle #3 Docetaxel.  He complains of bad, bad abdominal pain and intestinal cramps. He was feeling really bad intermittent abdominal pain for around 2 weeks. He would experience diarrhea with this. The pain got so intense that he could not even pick up a gallon of milk. He had to increase his pain medication to deal with this pain. He only takes them unless he has to. The last three days his stomach has quit feeling like this and felt good. He felt his best day was yesterday. He was able to get outside and do things. His daughter reports his main issue is the stomach pain.   Reports daily nausea, though he is not taking his anti-nausea medication regularly. He has experienced three episodes of vomiting since our last visit. He explains one of these was due to acid reflux after drinking too much coffee.  Reports his bowels are irregular and alternate between constipation, normal stool, and diarrhea. Diarrhea was significant after the first week  of treatment. He did use immodium. He did not call the clinic.   He has also been dealing with dizziness. He has dealt with this dizziness before, however it has worsened with treatment. He has been drinking gatorade, tea, and water, as well as coffee. His daughter notes he has been taking in plenty of fluids and even eats popsicles.   He has been swishing listerine and salt rinses when he had what was presumed to be thrush on his tongue and in his throat. This affected his taste. Admits he was "hard-headed" and did  not reach out to the clinic because he believed he could deal with it himself. His daughter purchased biotene when he complained of dry mouth and also encouraged him to eat yogurt. After starting the biotene and eating yogurt, the thrush resolved.  His daughter reports he has acid reflux as well. He took one antiacid which resolved the reflux.   He is scheduled to have teeth pulled on August 3rd with Dr. Enrique [name].  He notes that for the last 3 days he feels great. He remarks that the abdominal cramping was present after his first cycle however after cycle #2 it significantly worsened in duration and severity.     MEDICAL HISTORY:       Past Medical History  Diagnosis Date  . DVT (deep venous thrombosis) ([place]) 2004  . Arthritis     Left knee  . Cancer [facility])     Prostate  . Kidney stones     ~2002  . Prostate carcinoma ([place]) 12/26/2015  . Bone metastases ([place]) 12/26/2015    SURGICAL HISTORY:      Past Surgical History  Procedure Laterality Date  . Circumcision  30 years ago    SOCIAL HISTORY: Social History        Social History  . Marital Status: Single    Spouse Name: N/A  . Number of Children: 2  . Years of Education: N/A       Occupational History  . Construction/carpenter          Social History Main Topics  . Smoking status: Current Every Day Smoker -- 1.00 packs/day for 50 years    Types: Cigarettes  . Smokeless tobacco: Former Systems developer    Types: Chew    Quit date: 08/04/2005  . Alcohol Use: 0.0 oz/week    0 Standard drinks or equivalent per week     Comment: occ'l  . Drug Use: Yes     Comment: THC for appetite  . Sexual Activity: Not on file       Other Topics Concern  . Not on file   Social History Narrative  Divorced for 60 years 2 girls 4 grandchildren Smokes pack a day; Started at 61 years old Beer every once in a while  Hobbies-plays pool Works in Architect  FAMILY HISTORY:      Family  History  Problem Relation Age of Onset  . Cancer Mother     Matthew [name] (adopted him) Mother died in 58s from bone cancer Never met father Brothers and sisters all died from cancer 3 brother 3 sisters   ALLERGIES:  is allergic to meloxicam.  MEDICATIONS:        Current Outpatient Prescriptions  Medication Sig Dispense Refill  . Calcium Carb-Cholecalciferol (CALCIUM 500 + D3) 500-600 MG-UNIT TABS Take 2 tablets by mouth daily. 60 tablet 6  . Degarelix Acetate (FIRMAGON [place]) Inject into the skin every 28 (twenty-eight) days. Reported on 12/28/2015    .  denosumab (XGEVA) 120 MG/1.7ML SOLN injection Inject 120 mg into the skin every 28 (twenty-eight) days.    Marland Kitchen dexamethasone (DECADRON) 4 MG tablet The day before, day of, and day after chemo take 2 tabs in am and 2 tabs in pm. Take with food. 36 tablet 1  . DOCEtaxel (TAXOTERE IV) Inject into the vein. Has not started yet, to be given every 21 days    . ergocalciferol (VITAMIN D2) 50000 units capsule Take 1 capsule (50,000 Units total) by mouth once a week. 4 capsule 6  . fentaNYL (DURAGESIC - DOSED MCG/HR) 25 MCG/HR patch Place 1 patch (25 mcg total) onto the skin every 3 (three) days. May change to 2 patches after 3 days if needed 10 patch 0  . HYDROcodone-acetaminophen (NORCO) 10-325 MG tablet Take 1 tablet by mouth every 6 (six) hours as needed. 90 tablet 0  . HYDROcodone-acetaminophen (NORCO) 10-325 MG tablet Take 1 tablet by mouth every 4 (four) hours as needed. 60 tablet 0  . ibuprofen (ADVIL,MOTRIN) 600 MG tablet Take 300 mg by mouth 2 (two) times daily as needed for moderate pain. Reported on 01/29/2016    . lidocaine-prilocaine (EMLA) cream Apply a quarter size amount to port site 1 hour prior to chemo. Do not rub in. Cover with plastic wrap. 30 g 3  . ondansetron (ZOFRAN) 8 MG tablet Take 1 tablet (8 mg total) by mouth every 8 (eight) hours as needed for nausea or vomiting. 30 tablet 2  . pegfilgrastim (NEULASTA ONPRO) 6  MG/0.6ML injection Inject 6 mg into the skin every 21 ( twenty-one) days. Inject via provided programmed delivery device.    . predniSONE (DELTASONE) 5 MG tablet Take 1 tablet (5 mg total) by mouth daily with breakfast. 60 tablet 1  . prochlorperazine (COMPAZINE) 10 MG tablet Take 1 tablet (10 mg total) by mouth every 6 (six) hours as needed for nausea or vomiting. 30 tablet 2  . silver sulfADIAZINE (SILVADENE) 1 % cream Apply 1 application topically 3 (three) times daily. 400 g 0  . aspirin EC 81 MG tablet Take 81 mg by mouth daily. Reported on 02/18/2016    . Liniments (BLUE-EMU SUPER STRENGTH EX) Apply 1 application topically 4 (four) times daily as needed (knee/hip pain). Reported on 02/18/2016    . omeprazole (PRILOSEC) 40 MG capsule Take 1 capsule (40 mg total) by mouth daily. 30 capsule 3   No current facility-administered medications for this visit.    Review of Systems  HENT: Negative.   Eyes: Negative.   Respiratory: Negative.   Cardiovascular: Positive for chest pain.       Occasional chest cramping  Gastrointestinal: Positive for nausea, vomiting, abdominal pain, diarrhea and constipation.       Intermittent abdominal pain / intestinal cramping for two weeks. Resolved over the last 3 days. Nausea almost daily, not taking antinausea regularly 3 episodes of vomiting since our last visit Irregular bowels.  Musculoskeletal: Positive for joint pain.       Hip pain   Skin: Negative.   Neurological: Positive for dizziness and tingling.       Worse tingling in feet. Tingling near his eyes associated with migraine headaches. Dizziness worse with treatment  Endo/Heme/Allergies: Negative.   Psychiatric/Behavioral: Negative.   All other systems reviewed and are negative. 14 point ROS was done and is otherwise as detailed above or in HPI   PHYSICAL EXAMINATION: ECOG PERFORMANCE STATUS: 1 - Symptomatic but completely ambulatory     Filed Vitals:  02/18/16 0944  BP:  136/76  Pulse: 76  Temp: 97.9 F (36.6 C)  Resp: 18      Filed Weights   02/18/16 0944  Weight: 239 lb 12.8 oz (108.773 kg)    Physical Exam  Constitutional: He is oriented to person, place, and time and well-developed, well-nourished, and in no distress.  New hair growth noted  HENT:  Head: Normocephalic and atraumatic.  Nose: Nose normal.  Mouth/Throat: Oropharynx is clear and moist. No oropharyngeal exudate.  Poor dentition with multiple missing teeth  Eyes: Conjunctivae and EOM are normal. Pupils are equal, round, and reactive to light. Right eye exhibits no discharge. Left eye exhibits no discharge. No scleral icterus.  Neck: Normal range of motion. Neck supple. No tracheal deviation present. No thyromegaly present.  Cardiovascular: Normal rate, regular rhythm and normal heart sounds.  Exam reveals no gallop and no friction rub.   No murmur heard. Pulmonary/Chest: Effort normal. He has no wheezes. He has no rales.  Decreased throughout but clear  Abdominal: Soft. Bowel sounds are normal. He exhibits no distension and no mass. There is no tenderness. There is no rebound and no guarding.  Musculoskeletal: Normal range of motion. He exhibits no edema.  Lymphadenopathy:    He has no cervical adenopathy.  Neurological: He is alert and oriented to person, place, and time. He has normal reflexes. No cranial nerve deficit. Gait normal. Coordination normal.  Skin: Skin is warm and dry. No rash noted.  Psychiatric: Mood, memory, affect and judgment normal.  Nursing note and vitals reviewed.  LABORATORY DATA:  I have reviewed the data as listed  CBC Labs (Brief)          Component Value Date/Time   WBC 8.6 02/18/2016 0959   RBC 4.30 02/18/2016 0959   HGB 12.8* 02/18/2016 0959   HCT 37.9* 02/18/2016 0959   PLT 256 02/18/2016 0959   MCV 88.1 02/18/2016 0959   MCH 29.8 02/18/2016 0959   MCHC 33.8 02/18/2016 0959   RDW 16.1* 02/18/2016 0959   LYMPHSABS 0.8  02/18/2016 0959   MONOABS 0.5 02/18/2016 0959   EOSABS 0.0 02/18/2016 0959   BASOSABS 0.0 02/18/2016 0959     CMP               Labs (Brief)          Component Value Date/Time   NA 134* 02/18/2016 0959   K 4.6 02/18/2016 0959   CL 105 02/18/2016 0959   CO2 23 02/18/2016 0959   GLUCOSE 143* 02/18/2016 0959   BUN 14 02/18/2016 0959   CREATININE 0.76 02/18/2016 0959   CALCIUM 9.0 02/18/2016 0959   PROT 6.8 02/18/2016 0959   ALBUMIN 3.8 02/18/2016 0959   AST 17 02/18/2016 0959   ALT 20 02/18/2016 0959   ALKPHOS 172* 02/18/2016 0959   BILITOT 0.5 02/18/2016 0959   GFRNONAA >60 02/18/2016 0959   GFRAA >60 02/18/2016 0959      Results for AEDON, DEASON (MRN [id number]) as of 01/14/2016 17:35  Ref. Range 12/12/2015 07:50 12/28/2015 09:15 01/07/2016 10:21  PSA Latest Ref Range: 0.00-4.00 ng/mL 153.85 (H) 159.00 (H) 89.94 (H)          RADIOGRAPHIC STUDIES: I have personally reviewed the radiological images as listed and agreed with the findings in the report.      Study Result     CLINICAL DATA: Prostate cancer metastatic to bone.  EXAM: LEFT FEMUR 2 VIEWS  COMPARISON: Nuclear medicine whole-body bone scan  12/18/2015. Plain films 09/04/2014.  FINDINGS: No acute bony abnormality. No focal bony lesion visualized. No abnormality seen by plain film to correspond to the area of abnormality within the proximal left femur on prior bone scan.  IMPRESSION: No acute bony abnormality.   Electronically Signed  By: Rolm Baptise M.D.  On: 12/28/2015 11:22     PATHOLOGY   ASSESSMENT & PLAN:  Stage IV adenocarcinoma of prostate, hormone sensitive disease Bony Metastatic Disease Abnormal CT imaging Elevated PSA Urinary hesitancy, frequency Tobacco Abuse Cancer related Pain Weight loss  We will proceed as follows:  The patient is here for Cycle #3 Docetaxel today. I have adjusted his dose due to significant GI  symptoms.  The patient has been experiencing GI symptoms including increased acid reflux, abdominal pain / intestinal cramping, irregular bowels, nausea, and vomiting. He was instructed again to please call with problems after treatment so we can address his symptoms sooner.   I have refilled his pain medications. He was also prescribed a PPI to take daily during the remainder of his therapy.   He will return for follow up with his next cycle of treatment.   All questions were answered. The patient knows to call the clinic with any problems, questions or concerns.  This document serves as a record of services personally performed by Ancil Linsey, MD. It was created on her behalf by Arlyce Harman, a trained medical scribe. The creation of this record is based on the scribe's personal observations and the provider's statements to them. This document has been checked and approved by the attending provider.  I have reviewed the above documentation for accuracy and completeness, and I agree with the above.  This note was electronically signed.    Molli Hazard, MD  02/18/2016 8:03 PM

## 2016-03-06 NOTE — Discharge Instructions (Signed)

## 2016-03-06 NOTE — Transfer of Care (Signed)
Immediate Anesthesia Transfer of Care Note  Patient: Matthew [name]  Procedure(s) Performed: Procedure(s): Extraction of tooth #'s 3,5,6,8-12,20-22, 27, 28, and 31 with alveoloplasty and bilateral mandibular tori reductions (N/A)  Patient Location: PACU  Anesthesia Type:General  Level of Consciousness: awake  Airway & Oxygen Therapy: Patient Spontanous Breathing and Patient connected to face mask oxygen  Post-op Assessment: Report given to RN, Post -op Vital signs reviewed and stable and Patient moving all extremities  Post vital signs: Reviewed and stable  Last Vitals:  Vitals:   03/06/16 0946 03/06/16 0947  BP:  (!) 143/91  Pulse: 97 98  Resp: 18 19  Temp:      Last Pain:  Vitals:   03/06/16 0945  TempSrc:   PainSc: 0-No pain         Complications: No apparent anesthesia complications

## 2016-03-06 NOTE — Anesthesia Preprocedure Evaluation (Addendum)
Anesthesia Evaluation  Patient identified by MRN, date of birth, ID band Patient awake    Reviewed: Allergy & Precautions, NPO status , Patient's Chart, lab work & pertinent test results, reviewed documented beta blocker date and time   Airway Mallampati: I  TM Distance: >3 FB Neck ROM: Full    Dental  (+) Poor Dentition, Loose, Dental Advisory Given   Pulmonary neg pulmonary ROS, COPD, Current Smoker,  Pt has a loose nonproductive cough every day   breath sounds clear to auscultation       Cardiovascular  Rhythm:Regular Rate:Normal     Neuro/Psych Neck pain from ?disease negative neurological ROS     GI/Hepatic   Endo/Other  Pt is on daily prednisone and decadron on chemo days; has a porta cath right upper chest  Renal/GU Renal diseaseProstate ca c mets      Musculoskeletal  (+) Arthritis ,   Abdominal   Peds  Hematology   Anesthesia Other Findings Discussed nasal intubation with pt.  Reproductive/Obstetrics                          Anesthesia Physical Anesthesia Plan  ASA: II  Anesthesia Plan: General   Post-op Pain Management:    Induction: Intravenous  Airway Management Planned: Nasal ETT  Additional Equipment:   Intra-op Plan:   Post-operative Plan: Extubation in OR  Informed Consent: I have reviewed the patients History and Physical, chart, labs and discussed the procedure including the risks, benefits and alternatives for the proposed anesthesia with the patient or authorized representative who has indicated his/her understanding and acceptance.   Dental advisory given  Plan Discussed with: CRNA  Anesthesia Plan Comments:         Anesthesia Quick Evaluation

## 2016-03-06 NOTE — Progress Notes (Signed)
Pt with fentanyl patch to right chest, per daughter, 74mcg, placed last night.

## 2016-03-06 NOTE — Op Note (Signed)
OPERATIVE REPORT  Patient:            Matthew [name] Date of Birth:  May 16, 1956 MRN:                [id number]   DATE OF PROCEDURE:  03/06/2016  PREOPERATIVE DIAGNOSES: 1. Prostate cancer with bone metastases 2. Chronic apical periodontitis 3. Retained root segment 4. Dental caries 5. Chronic periodontitis 6. Loose teeth 7. Bilateral mandibular lingual tori  POSTOPERATIVE DIAGNOSES: 1. Prostate cancer with bone metastases 2. Chronic apical periodontitis 3. Retained root segment 4. Dental caries 5. Chronic periodontitis 6. Loose teeth 7. Bilateral mandibular lingual tori  OPERATIONS: 1. Multiple extraction of tooth numbers 3, 5, 6, 8, 9, 10, 11, 12, 20, 21, 22, 27, 28, and 31 2. 4 Quadrants of alveoloplasty 3. Bilateral mandibular lingual tori reductions   SURGEON: Lenn Cal, DDS  ASSISTANT: Camie Patience, (dental assistant)  ANESTHESIA: General anesthesia via nasoendotracheal tube.  MEDICATIONS: 1. Ancef 2 g IV prior to invasive dental procedures. 2. Local anesthesia with a total utilization of 6 carpules each containing 34 mg of lidocaine with 0.017 mg of epinephrine as well as 2 carpules each containing 9 mg of bupivacaine with 0.009 mg of epinephrine.  SPECIMENS: There are 14 teeth that were discarded.  DRAINS: None  CULTURES: None  COMPLICATIONS: None   ESTIMATED BLOOD LOSS: 100 mLs.  INTRAVENOUS FLUIDS: 1400 mLs of Lactated ringers solution.  INDICATIONS: The patient was recently diagnosed with prostate cancer with metastasis to bone.  A dental consultation was then requested to evaluate poor dentition as part of a pre-Xgeva therapy dental protocol examination.  The patient was examined and treatment planned for extraction of remaining teeth with alveoloplasty and pre-prosthetic surgery as indicated in the operating room with general anesthesia.  This treatment plan was formulated to decrease the risks and complications associated with dental  infection from affecting the patient's systemic health and to prevent future complications such as osteonecrosis of the jaws.  OPERATIVE FINDINGS: Patient was examined operating room number 8.  The teeth were identified for extraction. The patient was noted be affected by chronic periodontitis, chronic apical periodontitis, dental caries, retained root segment, loose teeth and bilateral mandibular lingual tori.   DESCRIPTION OF PROCEDURE: Patient was brought to the main operating room number 8. Patient was then placed in the supine position on the operating table. General anesthesia was then induced per the anesthesia team. The patient was then prepped and draped in the usual manner for dental medicine procedure. A timeout was performed. The patient was identified and procedures were verified. A throat pack was placed at this time. The oral cavity was then thoroughly examined with the findings noted above. The patient was then ready for dental medicine procedure as follows:  Local anesthesia was then administered sequentially with a total utilization of 6 carpules each containing 34 mg of lidocaine with 0.017 mg of epinephrine as well as 2 carpules  each containing 9 mg bupivacaine with 0.009 mg of epinephrine.  The Maxillary left and right quadrants first approached. Anesthesia was then delivered utilizing infiltration with lidocaine with epinephrine. A #15 blade incision was then made from the mesial of #1 and extended to the distal of #15.  A  surgical flap was then carefully reflected. The maxillary teeth were then subluxated with a series of straight elevators. Tooth #3 was then removed with a 53R forceps without complications. Tooth #5, 6, 8, 9, 10, 11, 12 were then removed with a  150 forceps leaving retained roots in the area of tooth numbers 5 and 12. Further bone was then removed around retained root tips in the area of #5 and 12 and the root tips were elevated out with a root tip pick without  further complications.  Alveoloplasty was then performed utilizing a ronguers and bone file to assist in obtaining primary closure. The surgical site was then irrigated with copious amounts of sterile saline 4. The tissues were approximated and trimmed appropriately. A piece of Surgifoam was placed extraction socket area #3. The surgical site was then closed from the mesial of #1 and extended to the mesial #8 utilizing 3-0 chromic gut suture in a continuous interrupted suture technique 1. The maxillary left surgical site was then closed from the distal of #15 extended the mesial of #9 utilizing 3-0 chromic gut suture in a continuous interrupted suture technique 1.  At this point time, the mandibular quadrants were approached. The patient was given bilateral inferior alveolar nerve blocks and long buccal nerve blocks utilizing the bupivacaine with epinephrine. Further infiltration was then achieved utilizing the lidocaine with epinephrine. A 15 blade incision was then made from the distal of number 18 and extended to the distal of #32.  A surgical flap was then carefully reflected. The lower teeth were then subluxated with a series of straight elevators. Tooth #31 was then removed with a 23 forceps leaving the distal root remaining. Further bone was then removed with a surgical handpiece and bur and copious amounts sterile water around the retained root tip as well as tooth numbers 20 and 22 at this time.  This root tip was then elevated out with a tip pick without further complication. Tooth numbers 28, 27, 22, 21, and 20 were then removed with a 151 forceps without complications. At this point time, the surgical flap was further reflected to expose the bilateral mandibular lingual tori. These tori were then reduced utilizing a surgical handpiece and bur and copious amounts sterile water. Alveoloplasty was then performed utilizing a rongeurs and bone file. The tissues were approximated and trimmed  appropriately. The surgical sites were then irrigated with copious amounts of sterile saline 4. The mandibular left surgical site was then closed from the distal of  18 and extended the mesial #24 utilizing 3-0 chromic gut suture in a continuous interrupted suture technique 1.  The mandibular right surgical site was then closed from the distal of 32 and extended the mesial #25 utilizing 3-0 chromic gut suture in a continuous surface suture technique 1. One additional interrupted sutures then placed to further closed surgical site as needed with 3-0 chromic gut material.   At this point time, the entire mouth was irrigated with copious amounts of sterile saline. The patient was examined for complications, seeing none, the dental medicine procedure was deemed to be complete. The throat pack was removed at this time. An oral airway was then placed at the request of the anesthesia team. A series of 4 x 4 gauze were placed in the mouth to aid hemostasis. The patient was then handed over to the anesthesia team for final disposition. After an appropriate amount of time, the patient was extubated and taken to the postanesthsia care unit in good condition. All counts were correct for the dental medicine procedure. The patient will be seen and approximately 7-10 days for evaluation for suture removal. Ideally, the Xgeva therapy will continue to be withheld until approximately one month after the surgical extractions to allow for initial  healing.  Patient will then follow-up with a dentist of his choice for fabrication of upper and lower complete dentures after adequate healing.   Lenn Cal, DDS.

## 2016-03-06 NOTE — Anesthesia Procedure Notes (Signed)
Procedure Name: Intubation Date/Time: 03/06/2016 7:50 AM Performed by: Terrill Mohr Pre-anesthesia Checklist: Patient identified, Emergency Drugs available, Suction available and Patient being monitored Patient Re-evaluated:Patient Re-evaluated prior to inductionOxygen Delivery Method: Circle system utilized Preoxygenation: Pre-oxygenation with 100% oxygen Intubation Type: IV induction Ventilation: Mask ventilation without difficulty Laryngoscope Size: Mac, 3 and 4 Grade View: Grade II Nasal Tubes: Right and Magill forceps- large, utilized Tube size: 7.0 mm Number of attempts: 2 (3 Mac Blade not long enough) Airway Equipment and Method: Stylet Placement Confirmation: ETT inserted through vocal cords under direct vision,  positive ETCO2 and breath sounds checked- equal and bilateral Tube secured with: Tape (taped across nose and forhead) Dental Injury: Teeth and Oropharynx as per pre-operative assessment  Comments: Dr. Orene Desanctis verified depth of cuff with Video Glide

## 2016-03-07 ENCOUNTER — Encounter (HOSPITAL_COMMUNITY): Payer: Self-pay | Admitting: Dentistry

## 2016-03-07 NOTE — Anesthesia Postprocedure Evaluation (Signed)
Anesthesia Post Note  Patient: Matthew [name]  Procedure(s) Performed: Procedure(s) (LRB): Extraction of tooth #'s 3,5,6,8-12,20-22, 27, 28, and 31 with alveoloplasty and bilateral mandibular tori reductions (N/A)  Patient location during evaluation: PACU Anesthesia Type: General Level of consciousness: awake and alert Pain management: pain level controlled Vital Signs Assessment: post-procedure vital signs reviewed and stable Respiratory status: spontaneous breathing, nonlabored ventilation, respiratory function stable and patient connected to nasal cannula oxygen Cardiovascular status: blood pressure returned to baseline and stable Postop Assessment: no signs of nausea or vomiting Anesthetic complications: no    Last Vitals:  Vitals:   03/06/16 1030 03/06/16 1040  BP: 106/72 116/69  Pulse: 77   Resp: 17   Temp: 36.7 C     Last Pain:  Vitals:   03/06/16 1040  TempSrc:   PainSc: 0-No pain                 Kerolos Nehme,JAMES TERRILL

## 2016-03-10 ENCOUNTER — Encounter (HOSPITAL_BASED_OUTPATIENT_CLINIC_OR_DEPARTMENT_OTHER): Payer: 59 | Admitting: Oncology

## 2016-03-10 ENCOUNTER — Encounter (HOSPITAL_COMMUNITY): Payer: 59 | Attending: Hematology & Oncology

## 2016-03-10 VITALS — BP 121/78 | HR 88 | Temp 98.9°F | Resp 16 | Wt 234.0 lb

## 2016-03-10 DIAGNOSIS — Z5111 Encounter for antineoplastic chemotherapy: Secondary | ICD-10-CM

## 2016-03-10 DIAGNOSIS — F17210 Nicotine dependence, cigarettes, uncomplicated: Secondary | ICD-10-CM | POA: Diagnosis not present

## 2016-03-10 DIAGNOSIS — I82409 Acute embolism and thrombosis of unspecified deep veins of unspecified lower extremity: Secondary | ICD-10-CM | POA: Diagnosis not present

## 2016-03-10 DIAGNOSIS — R3911 Hesitancy of micturition: Secondary | ICD-10-CM | POA: Insufficient documentation

## 2016-03-10 DIAGNOSIS — M1990 Unspecified osteoarthritis, unspecified site: Secondary | ICD-10-CM | POA: Insufficient documentation

## 2016-03-10 DIAGNOSIS — Z79899 Other long term (current) drug therapy: Secondary | ICD-10-CM | POA: Diagnosis not present

## 2016-03-10 DIAGNOSIS — R972 Elevated prostate specific antigen [PSA]: Secondary | ICD-10-CM | POA: Insufficient documentation

## 2016-03-10 DIAGNOSIS — C7951 Secondary malignant neoplasm of bone: Secondary | ICD-10-CM | POA: Diagnosis not present

## 2016-03-10 DIAGNOSIS — Z7982 Long term (current) use of aspirin: Secondary | ICD-10-CM | POA: Insufficient documentation

## 2016-03-10 DIAGNOSIS — C61 Malignant neoplasm of prostate: Secondary | ICD-10-CM

## 2016-03-10 DIAGNOSIS — R634 Abnormal weight loss: Secondary | ICD-10-CM | POA: Diagnosis not present

## 2016-03-10 LAB — COMPREHENSIVE METABOLIC PANEL
ALBUMIN: 3.9 g/dL (ref 3.5–5.0)
ALT: 28 U/L (ref 17–63)
ANION GAP: 8 (ref 5–15)
AST: 19 U/L (ref 15–41)
Alkaline Phosphatase: 126 U/L (ref 38–126)
BUN: 15 mg/dL (ref 6–20)
CHLORIDE: 102 mmol/L (ref 101–111)
CO2: 24 mmol/L (ref 22–32)
Calcium: 8.9 mg/dL (ref 8.9–10.3)
Creatinine, Ser: 0.82 mg/dL (ref 0.61–1.24)
GFR calc Af Amer: >60 mL/min (ref >60)
GFR calc non Af Amer: >60 mL/min (ref >60)
GLUCOSE: 159 mg/dL — AB (ref 65–99)
POTASSIUM: 4.4 mmol/L (ref 3.5–5.1)
SODIUM: 134 mmol/L — AB (ref 135–145)
TOTAL PROTEIN: 7 g/dL (ref 6.5–8.1)
Total Bilirubin: 0.6 mg/dL (ref 0.3–1.2)

## 2016-03-10 LAB — CBC WITH DIFFERENTIAL/PLATELET
BASOS ABS: 0 10*3/uL (ref 0.0–0.1)
BASOS PCT: 0 %
EOS ABS: 0 10*3/uL (ref 0.0–0.7)
Eosinophils Relative: 0 %
HEMATOCRIT: 39.9 % (ref 39.0–52.0)
Hemoglobin: 13.3 g/dL (ref 13.0–17.0)
Lymphocytes Relative: 7 %
Lymphs Abs: 0.7 10*3/uL (ref 0.7–4.0)
MCH: 29.9 pg (ref 26.0–34.0)
MCHC: 33.3 g/dL (ref 30.0–36.0)
MCV: 89.7 fL (ref 78.0–100.0)
MONO ABS: 0.4 10*3/uL (ref 0.1–1.0)
MONOS PCT: 4 %
NEUTROS ABS: 8.2 10*3/uL — AB (ref 1.7–7.7)
Neutrophils Relative %: 89 %
PLATELETS: 269 10*3/uL (ref 150–400)
RBC: 4.45 MIL/uL (ref 4.22–5.81)
RDW: 16.4 % — AB (ref 11.5–15.5)
WBC: 9.2 10*3/uL (ref 4.0–10.5)

## 2016-03-10 LAB — PSA: PSA: 6.17 ng/mL — ABNORMAL HIGH (ref 0.00–4.00)

## 2016-03-10 MED ORDER — ERGOCALCIFEROL 1.25 MG (50000 UT) PO CAPS: 50000.0000 [IU] | ORAL_CAPSULE | ORAL | 2 refills | Status: DC

## 2016-03-10 MED ORDER — PEGFILGRASTIM 6 MG/0.6ML ~~LOC~~ PSKT
6.0000 mg | PREFILLED_SYRINGE | Freq: Once | SUBCUTANEOUS | Status: AC
Start: 2016-03-10 — End: 2016-03-10
  Administered 2016-03-10: 6 mg via SUBCUTANEOUS
  Filled 2016-03-10: qty 0.6

## 2016-03-10 MED ORDER — FENTANYL 50 MCG/HR TD PT72: 50.0000 ug | MEDICATED_PATCH | TRANSDERMAL | 0 refills | Status: DC

## 2016-03-10 MED ORDER — HEPARIN SOD (PORK) LOCK FLUSH 100 UNIT/ML IV SOLN
500.0000 [IU] | Freq: Once | INTRAVENOUS | Status: AC | PRN
  Administered 2016-03-10: 500 [IU]
  Filled 2016-03-10: qty 5

## 2016-03-10 MED ORDER — LORAZEPAM 2 MG/ML IJ SOLN
0.5000 mg | Freq: Once | INTRAMUSCULAR | Status: AC
  Administered 2016-03-10: 0.5 mg via INTRAVENOUS

## 2016-03-10 MED ORDER — SODIUM CHLORIDE 0.9 % IV SOLN
Freq: Once | INTRAVENOUS | Status: AC
  Administered 2016-03-10: 12:00:00 via INTRAVENOUS
  Filled 2016-03-10: qty 4

## 2016-03-10 MED ORDER — SODIUM CHLORIDE 0.9% FLUSH
10.0000 mL | INTRAVENOUS | Status: DC | PRN
  Administered 2016-03-10: 10 mL
  Filled 2016-03-10: qty 10

## 2016-03-10 MED ORDER — DOCETAXEL CHEMO INJECTION 160 MG/16ML
60.0000 mg/m2 | Freq: Once | INTRAVENOUS | Status: AC
  Administered 2016-03-10: 140 mg via INTRAVENOUS
  Filled 2016-03-10: qty 8

## 2016-03-10 MED ORDER — LORAZEPAM 2 MG/ML IJ SOLN
INTRAMUSCULAR | Status: AC
  Filled 2016-03-10: qty 1

## 2016-03-10 MED ORDER — SODIUM CHLORIDE 0.9 % IV SOLN
Freq: Once | INTRAVENOUS | Status: AC
  Administered 2016-03-10: 11:00:00 via INTRAVENOUS

## 2016-03-10 NOTE — Progress Notes (Signed)
Matthew Kilts, MD [place] [id number]  Bone metastases [facility]) - Plan: fentaNYL ([place] - DOSED MCG/HR) 50 MCG/HR  Prostate carcinoma ([place]) - Plan: fentaNYL ([place] - DOSED MCG/HR) 50 MCG/HR  CURRENT THERAPY: Firmagon beginning on 12/28/2015.  Docetaxel every 21 days beginning on 01/07/2016.  XGEVA on HOLD for 8-12 weeks due to recent extraction.  INTERVAL HISTORY: Matthew [name] 60 y.o. 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    Prostate carcinoma ([place])   12/10/2015 Imaging    CTA chest, multiple thoracic and rib osseous lesions. no primary evident. Atherosclerosis including aortic and CAD     12/12/2015 Imaging    CT abdomen/pelvis with sclerotic osseous lesions througout the lumbar spine, sacrum, bilateral iliac bones and L proximal femur worrisome for sclerotic osseous mets,mild prostatomegaly, no LAD, Infrarenal 3.8 cm AAA     12/12/2015 Tumor Marker    PSA 153.85     12/18/2015 Imaging    Bone Scan Diffuse bony metastatic disease, posterior calvarium, sternum, thoracic, lumbar spine, bilateral ribs, L shoulder, bilateral bony pelvis, proximal L femur     12/24/2015 Initial Biopsy    CT biopsy of L iliac bone lesion     12/26/2015 Pathology Results    Bone, biopsy, left iliac - POSITIVE FOR METASTATIC ADENOCARCINOMA.     12/28/2015 -  Chemotherapy    Firmagon instituted 240 mg      01/01/2016 Procedure    Port placed by IR.     01/07/2016 -  Chemotherapy    Docetaxel every 21 days     02/18/2016 Adverse Reaction    GI toxicity from docetaxel, dose reduced to 60 mg/m2     02/18/2016 Treatment Plan Change    Docetaxel dose reduced by 20%     03/06/2016 Procedure    Dr. Enrique Sack- Multiple extraction of tooth numbers 3, 5, 6, 8, 9, 10, 11, 12, 20, 21, 22, 27, 28, and 31. 4 Quadrants of alveoloplasty. Bilateral mandibular lingual tori reductions.      He is  doing well.  He really denies any oncology or chemotherapy-related complaints.  He notes a "bump" on the anterior aspect of his left lower leg.  He does have varicosities.  It appears to be an ecchymosis with surrounding erythema.  He denies any pain or changes in the area.  Review of Systems  Constitutional: Negative for chills, fever and weight loss.  HENT: Negative.   Eyes: Negative.  Negative for blurred vision and double vision.  Respiratory: Negative.   Cardiovascular: Negative.   Gastrointestinal: Negative.   Genitourinary: Negative.   Musculoskeletal: Negative.   Skin: Negative.   Neurological: Negative.  Negative for headaches.  Endo/Heme/Allergies: Negative.   Psychiatric/Behavioral: Negative.     Past Medical History:  Diagnosis Date  . Arthritis    Left knee  . Bone metastases ([place]) 12/26/2015  . Cancer [facility])    Prostate  . DVT (deep venous thrombosis) ([place]) 2004  . Kidney stones    ~2002  . Prostate carcinoma ([place]) 12/26/2015    Past Surgical History:  Procedure Laterality Date  . CIRCUMCISION  30 years ago  . MULTIPLE EXTRACTIONS WITH ALVEOLOPLASTY N/A 03/06/2016   Procedure: Extraction of tooth #'s 3,5,6,8-12,20-22, 27, 28, and 31 with alveoloplasty and bilateral mandibular tori reductions;  Surgeon: Lenn Cal, DDS;  Location: [facility];  Service: Oral Surgery;  Laterality: N/A;  Family History  Problem Relation Age of Onset  . Cancer Mother     Social History   Social History  . Marital status: Single    Spouse name: N/A  . Number of children: 2  . Years of education: N/A   Occupational History  . Construction/carpenter    Social History Main Topics  . Smoking status: Current Every Day Smoker    Packs/day: 1.00    Years: 50.00    Types: Cigarettes  . Smokeless tobacco: Former Systems developer    Types: Chew    Quit date: 08/04/2005  . Alcohol use No  . Drug use:     Types: Marijuana     Comment: THC for appetite  . Sexual activity: Not on file    Other Topics Concern  . Not on file   Social History Narrative  . No narrative on file     PHYSICAL EXAMINATION  ECOG PERFORMANCE STATUS: 1 - Symptomatic but completely ambulatory  There were no vitals filed for this visit.  BP 121/78 P 88 T 98.9 R 16  GENERAL:alert, no distress, well nourished, well developed, comfortable, cooperative, obese, smiling and unaccompanied and in chemo-recliner having started chemotherapy. SKIN: skin color, texture, turgor are normal, no rashes or significant lesions.  See extremity exam below. HEAD: Normocephalic, No masses, lesions, tenderness or abnormalities EYES: normal, Conjunctiva are pink and non-injected EARS: External ears normal OROPHARYNX:lips, buccal mucosa, and tongue normal and mucous membranes are moist, edentulous NECK: supple, trachea midline LYMPH:  no palpable lymphadenopathy BREAST:not examined LUNGS: clear to auscultation and percussion. HEART: regular rate & rhythm, no murmurs, no gallops, S1 normal and S2 normal ABDOMEN:abdomen soft, non-tender, obese and normal bowel sounds BACK: Back symmetric, no curvature. EXTREMITIES:less then 2 second capillary refill, no joint deformities, effusion, or inflammation, no skin discoloration, no cyanosis.  Left anterior aspect of lower leg, superior section, there is a small dark purple lesion measuring 0.5 cm in size with surrounding erythema, well demarcated.  Erythematous area is not blanchable on palpation.  Left LE with many varicosities. NEURO: alert & oriented x 3 with fluent speech, no focal motor/sensory deficits, gait normal   LABORATORY DATA: CBC    Component Value Date/Time   WBC 9.2 03/10/2016 1017   RBC 4.45 03/10/2016 1017   HGB 13.3 03/10/2016 1017   HCT 39.9 03/10/2016 1017   PLT 269 03/10/2016 1017   MCV 89.7 03/10/2016 1017   MCH 29.9 03/10/2016 1017   MCHC 33.3 03/10/2016 1017   RDW 16.4 (H) 03/10/2016 1017   LYMPHSABS 0.7 03/10/2016 1017   MONOABS 0.4  03/10/2016 1017   EOSABS 0.0 03/10/2016 1017   BASOSABS 0.0 03/10/2016 1017      Chemistry      Component Value Date/Time   NA 134 (L) 03/10/2016 1017   K 4.4 03/10/2016 1017   CL 102 03/10/2016 1017   CO2 24 03/10/2016 1017   BUN 15 03/10/2016 1017   CREATININE 0.82 03/10/2016 1017      Component Value Date/Time   CALCIUM 8.9 03/10/2016 1017   ALKPHOS 126 03/10/2016 1017   AST 19 03/10/2016 1017   ALT 28 03/10/2016 1017   BILITOT 0.6 03/10/2016 1017      Lab Results  Component Value Date   PSA 6.17 (H) 03/10/2016   PSA 12.57 (H) 02/18/2016   PSA 33.38 (H) 01/28/2016     PENDING LABS:   RADIOGRAPHIC STUDIES:  No results found.   PATHOLOGY:    ASSESSMENT AND  PLAN:  Prostate carcinoma ([place]) Metastatic prostate adenocarcinoma, to bone.  Began [place] on 12/28/2015 and systemic chemotherapy with Docetaxel on 01/07/2016 and Prednisone 5 mg BID.  Oncology history is up to date.  Firmagon monthly moving forward per protocol.  Will transition to Depo-[name] in the future.  Oncology Flowsheet 02/22/2016  degarelix (FIRMAGON) [place] 80 mg    XGEVA ON HOLD for 8-12 weeks after undergoing extractions by Dr. Enrique Sack on 03/06/2016.  Current pain regimen: Fentanyl 50 mcg and PO Hydrocodone 2/day.  He requests a refill on his Fentanyl which will be done today.  He reports 40-50 Hydrocodone pills left in his last Rx.  Pre-treatment labs today as ordered: CBC diff, CMET, PSA.  I personally reviewed and went over laboratory results with the patient.  The results are noted within this dictation.  Labs today meet chemotherapy treatment parameters.  Therefore, chemotherapy will be administered today as planned.  Left lower extremity "bump" is suspicious for a varicosity subcutaneous bleed.  No intervention needed at this time other than supportive care.  Return in 3 weeks for follow-up and next cycle of chemotherapy.   ORDERS PLACED FOR THIS ENCOUNTER: No orders of the defined  types were placed in this encounter.   MEDICATIONS PRESCRIBED THIS ENCOUNTER: Meds ordered this encounter  Medications  . fentaNYL ([place] - DOSED MCG/HR) 50 MCG/HR    Sig: Place 1 patch (50 mcg total) onto the skin every 3 (three) days.    Dispense:  10 patch    Refill:  0    THERAPY PLAN:  Continue palliative treatment as outlined above with monthly Firmagon and beginning systemic chemotherapy consisting of Docetaxel for a planned 6 cycles.  Xgeva to reduce the risk of SRE on HOLD for 8-12 weeks following dental extractions as outlined above.  All questions were answered. The patient knows to call the clinic with any problems, questions or concerns. We can certainly see the patient much sooner if necessary.  Patient and plan discussed with Dr. Ancil Linsey and she is in agreement with the aforementioned.   This note is electronically signed by: Doy Mince 03/10/2016 6:13 PM

## 2016-03-10 NOTE — Patient Instructions (Signed)
[facility] Discharge Instructions for Patients Receiving Chemotherapy   Beginning January 23rd 2017 lab work for the [facility] will be done in the  Main lab at [facility] on 1st floor. If you have a lab appointment with the [facility] please come in thru the  Main Entrance and check in at the main information desk   Today you received the following chemotherapy agents Taxotere.  Neulasta OnPro will dispense medication between 4pm and 5pm tomorrow (Tuesday). You may remove device after 5pm tomorrow.   If you develop nausea and vomiting, or diarrhea that is not controlled by your medication, call the clinic.  The clinic phone number is [phone]. Office hours are Monday-Friday 8:30am-5:00pm.  BELOW ARE SYMPTOMS THAT SHOULD BE REPORTED IMMEDIATELY:  *FEVER GREATER THAN 101.0 F  *CHILLS WITH OR WITHOUT FEVER  NAUSEA AND VOMITING THAT IS NOT CONTROLLED WITH YOUR NAUSEA MEDICATION  *UNUSUAL SHORTNESS OF BREATH  *UNUSUAL BRUISING OR BLEEDING  TENDERNESS IN MOUTH AND THROAT WITH OR WITHOUT PRESENCE OF ULCERS  *URINARY PROBLEMS  *BOWEL PROBLEMS  UNUSUAL RASH Items with * indicate a potential emergency and should be followed up as soon as possible. If you have an emergency after office hours please contact your primary care physician or go to the nearest emergency department.  Please call the clinic during office hours if you have any questions or concerns.   You may also contact the Patient Navigator at [phone] should you have any questions or need assistance in obtaining follow up care.      Resources For Cancer Patients and their Caregivers ? American Cancer Society: Can assist with transportation, wigs, general needs, runs Look Good Feel Better.        815-083-0529 ? Cancer Care: Provides financial assistance, online support groups, medication/co-pay assistance.  1-800-813-HOPE (782)860-2361) ? [facility] Assists  [place] cancer patients and their families through emotional , educational and financial support.  727-142-4367 ? [place] DSS Where to apply for food stamps, Medicaid and utility assistance. 601-121-8075 ? RCATS: Transportation to medical appointments. 813 165 6370 ? Social Security Administration: May apply for disability if have a Stage IV cancer. 912-079-3733 858-496-0366 ? [employer], Disability and Transit Services: Assists with nutrition, care and transit needs. 8058477089

## 2016-03-10 NOTE — Progress Notes (Signed)
Tolerated chemo well. Marland KitchenLeonette Most arrived today for [facility] neulasta on body injector. See MAR for administration details. Injector in place and engaged with green light indicator on flashing. Tolerated application with out problems.  Ambulatory on discharge home to self.

## 2016-03-10 NOTE — Patient Instructions (Addendum)
[facility] at [facility] Discharge Instructions  RECOMMENDATIONS MADE BY THE CONSULTANT AND ANY TEST RESULTS WILL BE SENT TO YOUR REFERRING PHYSICIAN.    Thank you for choosing [facility] at [facility] to provide your oncology and hematology care.  To afford each patient quality time with our provider, please arrive at least 15 minutes before your scheduled appointment time.   Beginning January 23rd 2017 lab work for the Cancer Center will be done in the  Main lab at [facility] on 1st floor. If you have a lab appointment with the Cancer Center please come in thru the  Main Entrance and check in at the main information desk  You need to re-schedule your appointment should you arrive 10 or more minutes late.  We strive to give you quality time with our providers, and arriving late affects you and other patients whose appointments are after yours.  Also, if you no show three or more times for appointments you may be dismissed from the clinic at the providers discretion.     Again, thank you for choosing [facility].  Our hope is that these requests will decrease the amount of time that you wait before being seen by our physicians.       _____________________________________________________________  Should you have questions after your visit to [facility], please contact our office at [phone] between the hours of 8:30 a.m. and 4:30 p.m.  Voicemails left after 4:30 p.m. will not be returned until the following business day.  For prescription refill requests, have your pharmacy contact our office.         Resources For Cancer Patients and their Caregivers ? American Cancer Society: Can assist with transportation, wigs, general needs, runs Look Good Feel Better.        1-888-227-6333 ? Cancer Care: Provides financial assistance, online support groups, medication/co-pay assistance.  1-800-813-HOPE (4673) ? [facility] Assists [place] cancer patients and their families through emotional , educational and financial support.  336-427-4357 ? [place] DSS Where to apply for food stamps, Medicaid and utility assistance. 336-342-1394 ? RCATS: Transportation to medical appointments. 336-347-2287 ? Social Security Administration: May apply for disability if have a Stage IV cancer. 336-342-7796 1-800-772-1213 ? [place] Aging, Disability and Transit Services: Assists with nutrition, care and transit needs. 336-349-2343  Cancer Center Support Programs: @[email or web address]@ > Cancer Support Group  2nd Tuesday of the month 1pm-2pm, Journey Room  > Creative Journey  3rd Tuesday of the month 1130am-1pm, Journey Room  > Look Good Feel Better  1st Wednesday of the month 10am-12 noon, Journey Room (Call American Cancer Society to register 1-800-395-5775)   

## 2016-03-10 NOTE — Assessment & Plan Note (Addendum)
Metastatic prostate adenocarcinoma, to bone.  Began [place] on 12/28/2015 and systemic chemotherapy with Docetaxel on 01/07/2016 and Prednisone 5 mg BID.  Oncology history is up to date.  Firmagon monthly moving forward per protocol.  Will transition to Depo-[name] in the future.  Oncology Flowsheet 02/22/2016  degarelix (FIRMAGON) [place] 80 mg    XGEVA ON HOLD for 8-12 weeks after undergoing extractions by Dr. Enrique Sack on 03/06/2016.  Current pain regimen: Fentanyl 50 mcg and PO Hydrocodone 2/day.  He requests a refill on his Fentanyl which will be done today.  He reports 40-50 Hydrocodone pills left in his last Rx.  Pre-treatment labs today as ordered: CBC diff, CMET, PSA.  I personally reviewed and went over laboratory results with the patient.  The results are noted within this dictation.  Labs today meet chemotherapy treatment parameters.  Therefore, chemotherapy will be administered today as planned.  Left lower extremity "bump" is suspicious for a varicosity subcutaneous bleed.  No intervention needed at this time other than supportive care.  Return in 3 weeks for follow-up and next cycle of chemotherapy.

## 2016-03-17 ENCOUNTER — Encounter (HOSPITAL_COMMUNITY): Payer: Self-pay | Admitting: Dentistry

## 2016-03-17 ENCOUNTER — Ambulatory Visit (HOSPITAL_COMMUNITY): Payer: Medicaid - Dental | Admitting: Dentistry

## 2016-03-17 VITALS — BP 112/82 | HR 84 | Temp 98.4°F

## 2016-03-17 DIAGNOSIS — C7951 Secondary malignant neoplasm of bone: Secondary | ICD-10-CM

## 2016-03-17 DIAGNOSIS — K08109 Complete loss of teeth, unspecified cause, unspecified class: Secondary | ICD-10-CM

## 2016-03-17 DIAGNOSIS — C61 Malignant neoplasm of prostate: Secondary | ICD-10-CM

## 2016-03-17 DIAGNOSIS — T148XXD Other injury of unspecified body region, subsequent encounter: Secondary | ICD-10-CM

## 2016-03-17 DIAGNOSIS — K0820 Unspecified atrophy of edentulous alveolar ridge: Secondary | ICD-10-CM

## 2016-03-17 MED ORDER — CHLORHEXIDINE GLUCONATE 0.12 % MT SOLN: OROMUCOSAL | 99 refills | Status: AC

## 2016-03-17 NOTE — Patient Instructions (Signed)
PLAN: 1. Use chlorhexidine rinses 3 times daily after breakfast, lunch, and at bedtime. Patient is rinse for 30 seconds. Patient is to use in a swish and spit manner. Prescription was sent to [facility] in [place], [place]. 2. Continue soft diet at this time. 3. Return to clinic in 1 month for reevaluation of healing. Call if problems arise before then. 4. We will suggest holding of Xgeva therapy for at least 2 months to allow for healing of the extraction sites especially the lower right  lingual aspect.  Restarting of Xgeva therapy, however, is at the discretion of Dr. Whitney Muse should clinical conditions arise for the restart before 2 months.   Lenn Cal, DDS

## 2016-03-17 NOTE — Progress Notes (Signed)
POST OPERATIVE NOTE:  03/17/2016 Matthew [name] [id number]  VITALS: BP 112/82   Pulse 84   Temp 98.4 F (36.9 C)   LABS:  Lab Results  Component Value Date   WBC 9.2 03/10/2016   HGB 13.3 03/10/2016   HCT 39.9 03/10/2016   MCV 89.7 03/10/2016   PLT 269 03/10/2016   BMET    Component Value Date/Time   NA 134 (L) 03/10/2016 1017   K 4.4 03/10/2016 1017   CL 102 03/10/2016 1017   CO2 24 03/10/2016 1017   GLUCOSE 159 (H) 03/10/2016 1017   BUN 15 03/10/2016 1017   CREATININE 0.82 03/10/2016 1017   CALCIUM 8.9 03/10/2016 1017   GFRNONAA >60 03/10/2016 1017   GFRAA >60 03/10/2016 1017    Lab Results  Component Value Date   INR 0.96 01/01/2016   INR 1.04 12/24/2015   No results found for: PTT   Matthew [name] is status post extraction remaining teeth with alveoloplasty and pre-prosthetic surgery as needed the operating room with general anesthesia on 03/06/2016.patient now presents for examination of healing and suture removal as needed.  SUBJECTIVE: Patient with minimal complaints. Patient is using pain medication as needed. Patient did proceed with chemotherapy last week. Patient is using a soft diet at this time.  EXAM: There is no sign of infection, heme, or ooze. Sutures are loosely intact. There is an area of delayed healing involving the lower right lingual in the area of tooth numbers 30-32.  PROCEDURE: The patient was given a chlorhexidine gluconate rinse for 30 seconds. Sutures were then removed without complication. Patient tolerated the procedure well.  ASSESSMENT: Post operative course is consistent with dental procedures performed in the operating room with general anesthesia. The patient is now edentulous. There is atrophy of the edentulous alveolar ridges. There is some delayed healing involving the lower right lingual aspect area #30-32.  PLAN: 1. Use chlorhexidine rinses 3 times daily after breakfast, lunch, and at bedtime. Patient is rinse for 30  seconds. Patient is to use in a swish and spit manner. Prescription was sent to [facility] in [place], [place]. 2. Continue soft diet at this time. 3. Return to clinic in 1 month for reevaluation of healing. Call if problems arise before then. 4. We will suggest holding of Xgeva therapy for at least 2 months to allow for healing of the extraction sites especially the lower right  lingual aspect.  Restarting of Xgeva therapy, however, is at the discretion of Dr. Whitney Muse should clinical conditions arise for the restart before 2 months.   Lenn Cal, DDS

## 2016-03-21 ENCOUNTER — Other Ambulatory Visit (HOSPITAL_COMMUNITY): Payer: Self-pay | Admitting: Hematology & Oncology

## 2016-03-21 ENCOUNTER — Encounter (HOSPITAL_BASED_OUTPATIENT_CLINIC_OR_DEPARTMENT_OTHER): Payer: 59

## 2016-03-21 VITALS — BP 104/72 | HR 78 | Temp 98.3°F

## 2016-03-21 DIAGNOSIS — C61 Malignant neoplasm of prostate: Secondary | ICD-10-CM | POA: Diagnosis not present

## 2016-03-21 DIAGNOSIS — Z5111 Encounter for antineoplastic chemotherapy: Secondary | ICD-10-CM

## 2016-03-21 DIAGNOSIS — C7951 Secondary malignant neoplasm of bone: Secondary | ICD-10-CM

## 2016-03-21 MED ORDER — DEGARELIX ACETATE 80 MG ~~LOC~~ SOLR
80.0000 mg | Freq: Once | SUBCUTANEOUS | Status: AC
  Administered 2016-03-21: 80 mg via SUBCUTANEOUS
  Filled 2016-03-21: qty 4

## 2016-03-21 MED ORDER — HYDROCODONE-ACETAMINOPHEN 10-325 MG PO TABS: 1.0000 | ORAL_TABLET | ORAL | 0 refills | Status: DC | PRN

## 2016-03-21 NOTE — Progress Notes (Signed)
Matthew [name] presents today for injection per MD orders. Firmagon administered SQ in right Abdomen. Administration without incident. Patient tolerated well.

## 2016-03-21 NOTE — Progress Notes (Signed)
Patient discharged ambulatory and stable.  

## 2016-03-28 NOTE — Progress Notes (Signed)
[facility]  PROGRESS NOTE  Patient Care Team: [name], MD as PCP - General (Family Medicine)  CHIEF COMPLAINTS:  CT angiography chest w/contrast 12/10/2015 multiple thoracic and rib osseous lesions suggesting osseous metastatic disease. No obvious primary PSA 12/12/2015 153.85    Prostate carcinoma ([place])   12/10/2015 Imaging    CTA chest, multiple thoracic and rib osseous lesions. no primary evident. Atherosclerosis including aortic and CAD      12/12/2015 Imaging    CT abdomen/pelvis with sclerotic osseous lesions througout the lumbar spine, sacrum, bilateral iliac bones and L proximal femur worrisome for sclerotic osseous mets,mild prostatomegaly, no LAD, Infrarenal 3.8 cm AAA      12/12/2015 Tumor Marker    PSA 153.85      12/18/2015 Imaging    Bone Scan Diffuse bony metastatic disease, posterior calvarium, sternum, thoracic, lumbar spine, bilateral ribs, L shoulder, bilateral bony pelvis, proximal L femur      12/24/2015 Initial Biopsy    CT biopsy of L iliac bone lesion      12/26/2015 Pathology Results    Bone, biopsy, left iliac - POSITIVE FOR METASTATIC ADENOCARCINOMA.      12/28/2015 -  Chemotherapy    Firmagon instituted 240 mg       01/01/2016 Procedure    Port placed by IR.      01/07/2016 -  Chemotherapy    Docetaxel every 21 days      02/18/2016 Adverse Reaction    GI toxicity from docetaxel, dose reduced to 60 mg/m2      02/18/2016 Treatment Plan Change    Docetaxel dose reduced by 20%      03/06/2016 Procedure    Dr. Enrique Sack- Multiple extraction of tooth numbers 3, 5, 6, 8, 9, 10, 11, 12, 20, 21, 22, 27, 28, and 31. 4 Quadrants of alveoloplasty. Bilateral mandibular lingual tori reductions.        HISTORY OF PRESENTING ILLNESS:  Matthew [name] 60 y.o. male is here for further follow-up of stage IV prostate cancer. He is on firmagon (which he notes drastically improved his pain) taxotere and prednisone.   Matthew [name] is accompanied  by his daughter and here for Cycle #5 Docetaxel.  He is tearful today. He notes that the reality of what has been going on has "finally" settled in. His daughter does want something for his mood, he is agreeable. He is however doing much better physically overall. Occasional sternal pain rated as a 2/10.   He reports an increase in his GERD over the past few weeks. He takes omeprazole once daily. He adds he cannot take tums because he can't currently chew them.  He complains of headaches that have increased over the last few weeks as well. He noes dizziness at times that accompanies them. No change in vision. No nausea or vomiting with the headaches.   MEDICAL HISTORY:  Past Medical History:  Diagnosis Date  . Arthritis    Left knee  . Bone metastases ([facility]) 12/26/2015  . Cancer [facility])    Prostate  . DVT (deep venous thrombosis) ([facility]) 2004  . Kidney stones    ~2002  . Prostate carcinoma ([facility]) 12/26/2015    SURGICAL HISTORY: Past Surgical History:  Procedure Laterality Date  . CIRCUMCISION  30 years ago  . MULTIPLE EXTRACTIONS WITH ALVEOLOPLASTY N/A 03/06/2016   Procedure: Extraction of tooth #'s 3,5,6,8-12,20-22, 27, 28, and 31 with alveoloplasty and bilateral mandibular tori reductions;  Surgeon: Lenn Cal, DDS;  Location: [facility] OR;  Service: Oral Surgery;  Laterality: N/A;    SOCIAL HISTORY: Social History   Social History  . Marital status: Single    Spouse name: N/A  . Number of children: 2  . Years of education: N/A   Occupational History  . Construction/carpenter    Social History Main Topics  . Smoking status: Current Every Day Smoker    Packs/day: 1.00    Years: 50.00    Types: Cigarettes  . Smokeless tobacco: Former Systems developer    Types: Chew    Quit date: 08/04/2005  . Alcohol use No  . Drug use:     Types: Marijuana     Comment: THC for appetite  . Sexual activity: Not on file   Other Topics Concern  . Not on file   Social History Narrative  . No narrative  on file  Divorced for 46 years 2 girls 4 grandchildren Smokes pack a day; Started at 60 years old Beer every once in a while  Hobbies-plays pool Works in Architect  FAMILY HISTORY: Family History  Problem Relation Age of Onset  . Cancer Mother     Matthew [name] (adopted him) Mother died in 54s from bone cancer Never met father Brothers and sisters all died from cancer 3 brother 3 sisters   ALLERGIES:  is allergic to meloxicam.  MEDICATIONS:  Current Outpatient Prescriptions  Medication Sig Dispense Refill  . Calcium Carb-Cholecalciferol (CALCIUM 500 + D3) 500-600 MG-UNIT TABS Take 2 tablets by mouth daily. 60 tablet 6  . calcium carbonate (OS-CAL) 1250 (500 Ca) MG chewable tablet Chew 1 tablet by mouth daily as needed for heartburn.    . chlorhexidine (PERIDEX) 0.12 % solution Rinse with 15 mls three times daily for 30 seconds. Use after breakfast, lunch, and at bedtime. Spit out excess. Do not swallow. 960 mL prn  . Degarelix Acetate (FIRMAGON [place]) Inject into the skin every 28 (twenty-eight) days. Reported on 12/28/2015    . dexamethasone (DECADRON) 4 MG tablet The day before, day of, and day after chemo take 2 tabs in am and 2 tabs in pm. Take with food. 36 tablet 1  . DOCEtaxel (TAXOTERE IV) Inject into the vein. Has not started yet, to be given every 21 days    . ergocalciferol (VITAMIN D2) 50000 units capsule Take 1 capsule (50,000 Units total) by mouth every Friday. 30 capsule 2  . fentaNYL (DURAGESIC - DOSED MCG/HR) 50 MCG/HR Place 1 patch (50 mcg total) onto the skin every 3 (three) days. 10 patch 0  . HYDROcodone-acetaminophen (NORCO) 10-325 MG tablet Take 1 tablet by mouth every 4 (four) hours as needed. 60 tablet 0  . lidocaine-prilocaine (EMLA) cream Apply a quarter size amount to port site 1 hour prior to chemo. Do not rub in. Cover with plastic wrap. 30 g 3  . omeprazole (PRILOSEC) 40 MG capsule Take 1 capsule (40 mg total) by mouth 2 (two) times daily. 60 capsule 3    . ondansetron (ZOFRAN) 8 MG tablet Take 1 tablet (8 mg total) by mouth every 8 (eight) hours as needed for nausea or vomiting. 30 tablet 2  . pegfilgrastim (NEULASTA ONPRO) 6 MG/0.6ML injection Inject 6 mg into the skin every 21 ( twenty-one) days. Inject via provided programmed delivery device.    . predniSONE (DELTASONE) 5 MG tablet Take 1 tablet (5 mg total) by mouth daily with breakfast. 60 tablet 1  . prochlorperazine (COMPAZINE) 10 MG tablet Take 1 tablet (10 mg total) by  mouth every 6 (six) hours as needed for nausea or vomiting. 30 tablet 2  . silver sulfADIAZINE (SILVADENE) 1 % cream Apply 1 application topically 3 (three) times daily. 400 g 0  . escitalopram (LEXAPRO) 10 MG tablet Take 1 tablet (10 mg total) by mouth daily. 30 tablet 3   No current facility-administered medications for this visit.     Review of Systems  Eyes: Negative.   Respiratory: Negative.   Cardiovascular: Positive for chest pain.       Occasional chest cramping  Gastrointestinal: Positive for heartburn and nausea. Negative for abdominal pain, diarrhea and vomiting.  Musculoskeletal: Positive for joint pain.       Hip pain   Skin: Negative.   Neurological: Positive for dizziness, tingling and headaches.  Endo/Heme/Allergies: Negative.   Psychiatric/Behavioral: Negative.   All other systems reviewed and are negative. 14 point ROS was done and is otherwise as detailed above or in HPI   PHYSICAL EXAMINATION: ECOG PERFORMANCE STATUS: 1 - Symptomatic but completely ambulatory Vitals - 1 value per visit 5/32/9924  SYSTOLIC 268  DIASTOLIC 74  Pulse 80  Temperature 98.6  Respirations 18  Weight (lb) 235.4  Height   BMI 31.06  VISIT REPORT     Physical Exam  Constitutional: He is oriented to person, place, and time and well-developed, well-nourished, and in no distress.  New hair growth noted  HENT:  Head: Normocephalic and atraumatic.  Nose: Nose normal.  Mouth/Throat: Oropharynx is clear and  moist. No oropharyngeal exudate.  Edentulous  Eyes: Conjunctivae and EOM are normal. Pupils are equal, round, and reactive to light. Right eye exhibits no discharge. Left eye exhibits no discharge. No scleral icterus.  Neck: Normal range of motion. Neck supple. No tracheal deviation present. No thyromegaly present.  Cardiovascular: Normal rate, regular rhythm and normal heart sounds.  Exam reveals no gallop and no friction rub.   No murmur heard. Pulmonary/Chest: Effort normal. He has no wheezes. He has no rales.  Decreased throughout but clear  Abdominal: Soft. Bowel sounds are normal. He exhibits no distension and no mass. There is no tenderness. There is no rebound and no guarding.  Musculoskeletal: Normal range of motion. He exhibits no edema.  Lymphadenopathy:    He has no cervical adenopathy.  Neurological: He is alert and oriented to person, place, and time. He has normal reflexes. No cranial nerve deficit. Gait normal. Coordination normal.  Skin: Skin is warm and dry. No rash noted.  Psychiatric: Mood, memory, affect and judgment normal.  Nursing note and vitals reviewed.  LABORATORY DATA:  I have reviewed the data as listed  CBC    Component Value Date/Time   WBC 9.4 03/31/2016 1010   RBC 4.36 03/31/2016 1010   HGB 13.1 03/31/2016 1010   HCT 40.0 03/31/2016 1010   PLT 279 03/31/2016 1010   MCV 91.7 03/31/2016 1010   MCH 30.0 03/31/2016 1010   MCHC 32.8 03/31/2016 1010   RDW 16.0 (H) 03/31/2016 1010   LYMPHSABS 0.8 03/31/2016 1010   MONOABS 0.6 03/31/2016 1010   EOSABS 0.0 03/31/2016 1010   BASOSABS 0.0 03/31/2016 1010   CMP     Component Value Date/Time   NA 136 03/31/2016 1010   K 4.3 03/31/2016 1010   CL 104 03/31/2016 1010   CO2 22 03/31/2016 1010   GLUCOSE 145 (H) 03/31/2016 1010   BUN 13 03/31/2016 1010   CREATININE 0.76 03/31/2016 1010   CALCIUM 9.0 03/31/2016 1010   PROT 6.8 03/31/2016  1010   ALBUMIN 3.8 03/31/2016 1010   AST 19 03/31/2016 1010   ALT  17 03/31/2016 1010   ALKPHOS 125 03/31/2016 1010   BILITOT 0.5 03/31/2016 1010   GFRNONAA >60 03/31/2016 1010   GFRAA >60 03/31/2016 1010   Results for TAYON, PAREKH (MRN [id number])   Ref. Range 12/28/2015 09:15 01/07/2016 10:21 01/28/2016 09:15 02/18/2016 09:59 03/10/2016 10:17  PSA Latest Ref Range: 0.00 - 4.00 ng/mL 159.00 (H) 89.94 (H) 33.38 (H) 12.57 (H) 6.17 (H)      RADIOGRAPHIC STUDIES: I have personally reviewed the radiological images as listed and agreed with the findings in the report.  Study Result     CLINICAL DATA: Prostate cancer metastatic to bone.  EXAM: LEFT FEMUR 2 VIEWS  COMPARISON: Nuclear medicine whole-body bone scan 12/18/2015. Plain films 09/04/2014.  FINDINGS: No acute bony abnormality. No focal bony lesion visualized. No abnormality seen by plain film to correspond to the area of abnormality within the proximal left femur on prior bone scan.  IMPRESSION: No acute bony abnormality.   Electronically Signed  By: Rolm Baptise M.D.  On: 12/28/2015 11:22     PATHOLOGY   ASSESSMENT & PLAN:  Stage IV adenocarcinoma of prostate, hormone sensitive disease Bony Metastatic Disease Abnormal CT imaging Elevated PSA Urinary hesitancy, frequency Tobacco Abuse Cancer related Pain Weight loss GERD Situational Depression headache  We will proceed as follows:  The patient is here for Cycle #5 Docetaxel today. We will proceed with therapy.   Weight is stable. GI symptoms are overall improved since decreasing docetaxel dose. I have asked him to increase his omeprazole to twice daily for his GERD. If his symptoms persist after completion of chemotherapy we will refer him to GI.  I have called him in a 40 mg celexa, he will take 20 mg for 1 to 2 weeks and then increase to 40 mg thereafter.   He will return for follow up with his next cycle of treatment.   In regards to his headaches, MRI of the brain is ordered. He will be apprised of the  results once available.   Orders Placed This Encounter  Procedures  . MR Brain W Wo Contrast    Standing Status:   Future    Standing Expiration Date:   03/31/2017    Order Specific Question:   If indicated for the ordered procedure, I authorize the administration of contrast media per Radiology protocol    Answer:   Yes    Order Specific Question:   Reason for Exam (SYMPTOM  OR DIAGNOSIS REQUIRED)    Answer:   stage IV prostate cancer, severe headaches    Order Specific Question:   Preferred imaging location?    Answer:   [facility] (table limit-350lbs)    Order Specific Question:   What is the patient's sedation requirement?    Answer:   No Sedation    Order Specific Question:   Does the patient have a pacemaker or implanted devices?    Answer:   No  . CBC with Differential    Standing Status:   Future    Standing Expiration Date:   03/31/2017  . Comprehensive metabolic panel    Standing Status:   Future    Standing Expiration Date:   03/31/2017  . PSA    Standing Status:   Future    Standing Expiration Date:   03/31/2017   All questions were answered. The patient knows to call the clinic with any problems,  questions or concerns.  This note was electronically signed.    Molli Hazard, MD  03/31/2016 5:26 PM

## 2016-03-31 ENCOUNTER — Encounter (HOSPITAL_COMMUNITY): Payer: Self-pay | Admitting: Hematology & Oncology

## 2016-03-31 ENCOUNTER — Encounter (HOSPITAL_COMMUNITY): Payer: 59

## 2016-03-31 ENCOUNTER — Encounter (HOSPITAL_BASED_OUTPATIENT_CLINIC_OR_DEPARTMENT_OTHER): Payer: 59 | Admitting: Hematology & Oncology

## 2016-03-31 VITALS — BP 113/74 | HR 80 | Temp 98.6°F | Resp 18 | Wt 235.4 lb

## 2016-03-31 DIAGNOSIS — G893 Neoplasm related pain (acute) (chronic): Secondary | ICD-10-CM

## 2016-03-31 DIAGNOSIS — R972 Elevated prostate specific antigen [PSA]: Secondary | ICD-10-CM

## 2016-03-31 DIAGNOSIS — Z5111 Encounter for antineoplastic chemotherapy: Secondary | ICD-10-CM | POA: Diagnosis not present

## 2016-03-31 DIAGNOSIS — C61 Malignant neoplasm of prostate: Secondary | ICD-10-CM

## 2016-03-31 DIAGNOSIS — R519 Headache, unspecified: Secondary | ICD-10-CM

## 2016-03-31 DIAGNOSIS — R51 Headache: Secondary | ICD-10-CM

## 2016-03-31 DIAGNOSIS — Z720 Tobacco use: Secondary | ICD-10-CM

## 2016-03-31 DIAGNOSIS — C7951 Secondary malignant neoplasm of bone: Secondary | ICD-10-CM | POA: Diagnosis not present

## 2016-03-31 DIAGNOSIS — F4321 Adjustment disorder with depressed mood: Secondary | ICD-10-CM

## 2016-03-31 DIAGNOSIS — R634 Abnormal weight loss: Secondary | ICD-10-CM

## 2016-03-31 DIAGNOSIS — R350 Frequency of micturition: Secondary | ICD-10-CM

## 2016-03-31 DIAGNOSIS — K219 Gastro-esophageal reflux disease without esophagitis: Secondary | ICD-10-CM

## 2016-03-31 LAB — COMPREHENSIVE METABOLIC PANEL
ALK PHOS: 125 U/L (ref 38–126)
ALT: 17 U/L (ref 17–63)
AST: 19 U/L (ref 15–41)
Albumin: 3.8 g/dL (ref 3.5–5.0)
Anion gap: 10 (ref 5–15)
BUN: 13 mg/dL (ref 6–20)
CALCIUM: 9 mg/dL (ref 8.9–10.3)
CHLORIDE: 104 mmol/L (ref 101–111)
CO2: 22 mmol/L (ref 22–32)
CREATININE: 0.76 mg/dL (ref 0.61–1.24)
Glucose, Bld: 145 mg/dL — ABNORMAL HIGH (ref 65–99)
Potassium: 4.3 mmol/L (ref 3.5–5.1)
Sodium: 136 mmol/L (ref 135–145)
Total Bilirubin: 0.5 mg/dL (ref 0.3–1.2)
Total Protein: 6.8 g/dL (ref 6.5–8.1)

## 2016-03-31 LAB — CBC WITH DIFFERENTIAL/PLATELET
BASOS PCT: 0 %
Basophils Absolute: 0 10*3/uL (ref 0.0–0.1)
EOS ABS: 0 10*3/uL (ref 0.0–0.7)
EOS PCT: 0 %
HCT: 40 % (ref 39.0–52.0)
HEMOGLOBIN: 13.1 g/dL (ref 13.0–17.0)
LYMPHS PCT: 8 %
Lymphs Abs: 0.8 10*3/uL (ref 0.7–4.0)
MCH: 30 pg (ref 26.0–34.0)
MCHC: 32.8 g/dL (ref 30.0–36.0)
MCV: 91.7 fL (ref 78.0–100.0)
MONOS PCT: 6 %
Monocytes Absolute: 0.6 10*3/uL (ref 0.1–1.0)
NEUTROS ABS: 8 10*3/uL — AB (ref 1.7–7.7)
NEUTROS PCT: 86 %
PLATELETS: 279 10*3/uL (ref 150–400)
RBC: 4.36 MIL/uL (ref 4.22–5.81)
RDW: 16 % — ABNORMAL HIGH (ref 11.5–15.5)
WBC: 9.4 10*3/uL (ref 4.0–10.5)

## 2016-03-31 LAB — PSA: PSA: 3.12 ng/mL (ref 0.00–4.00)

## 2016-03-31 MED ORDER — SODIUM CHLORIDE 0.9 % IV SOLN
Freq: Once | INTRAVENOUS | Status: AC
  Administered 2016-03-31: 13:00:00 via INTRAVENOUS
  Filled 2016-03-31: qty 4

## 2016-03-31 MED ORDER — SODIUM CHLORIDE 0.9% FLUSH
10.0000 mL | INTRAVENOUS | Status: DC | PRN
  Administered 2016-03-31: 10 mL
  Filled 2016-03-31: qty 10

## 2016-03-31 MED ORDER — OMEPRAZOLE 40 MG PO CPDR: 40.0000 mg | DELAYED_RELEASE_CAPSULE | Freq: Two times a day (BID) | ORAL | 3 refills | Status: DC

## 2016-03-31 MED ORDER — LORAZEPAM 2 MG/ML IJ SOLN
INTRAMUSCULAR | Status: AC
  Filled 2016-03-31: qty 1

## 2016-03-31 MED ORDER — SODIUM CHLORIDE 0.9 % IV SOLN
Freq: Once | INTRAVENOUS | Status: AC
  Administered 2016-03-31: 12:00:00 via INTRAVENOUS

## 2016-03-31 MED ORDER — PEGFILGRASTIM 6 MG/0.6ML ~~LOC~~ PSKT
6.0000 mg | PREFILLED_SYRINGE | Freq: Once | SUBCUTANEOUS | Status: AC
  Administered 2016-03-31: 6 mg via SUBCUTANEOUS

## 2016-03-31 MED ORDER — PEGFILGRASTIM 6 MG/0.6ML ~~LOC~~ PSKT
PREFILLED_SYRINGE | SUBCUTANEOUS | Status: AC
  Filled 2016-03-31: qty 0.6

## 2016-03-31 MED ORDER — LORAZEPAM 2 MG/ML IJ SOLN
0.5000 mg | Freq: Once | INTRAMUSCULAR | Status: AC
  Administered 2016-03-31: 0.5 mg via INTRAVENOUS

## 2016-03-31 MED ORDER — DOCETAXEL CHEMO INJECTION 160 MG/16ML
60.0000 mg/m2 | Freq: Once | INTRAVENOUS | Status: AC
  Administered 2016-03-31: 140 mg via INTRAVENOUS
  Filled 2016-03-31: qty 14

## 2016-03-31 MED ORDER — HEPARIN SOD (PORK) LOCK FLUSH 100 UNIT/ML IV SOLN
INTRAVENOUS | Status: AC
  Filled 2016-03-31: qty 5

## 2016-03-31 MED ORDER — ESCITALOPRAM OXALATE 10 MG PO TABS: 10.0000 mg | ORAL_TABLET | Freq: Every day | ORAL | 3 refills | Status: DC

## 2016-03-31 MED ORDER — HEPARIN SOD (PORK) LOCK FLUSH 100 UNIT/ML IV SOLN
500.0000 [IU] | Freq: Once | INTRAVENOUS | Status: AC | PRN
  Administered 2016-03-31: 500 [IU]

## 2016-03-31 NOTE — Patient Instructions (Signed)
[facility] Discharge Instructions for Patients Receiving Chemotherapy   Beginning January 23rd 2017 lab work for the [facility] will be done in the  Main lab at [facility] on 1st floor. If you have a lab appointment with the [facility] please come in thru the  Main Entrance and check in at the main information desk   Today you received the following chemotherapy agents Taxotere. Neulasta Onpro on left upper arm. Medication will dispense between 5pm and 6pm tomorrow. You may remove devive after 6pm tomorrow.  If you develop nausea and vomiting, or diarrhea that is not controlled by your medication, call the clinic.  The clinic phone number is [phone]. Office hours are Monday-Friday 8:30am-5:00pm.  BELOW ARE SYMPTOMS THAT SHOULD BE REPORTED IMMEDIATELY:  *FEVER GREATER THAN 101.0 F  *CHILLS WITH OR WITHOUT FEVER  NAUSEA AND VOMITING THAT IS NOT CONTROLLED WITH YOUR NAUSEA MEDICATION  *UNUSUAL SHORTNESS OF BREATH  *UNUSUAL BRUISING OR BLEEDING  TENDERNESS IN MOUTH AND THROAT WITH OR WITHOUT PRESENCE OF ULCERS  *URINARY PROBLEMS  *BOWEL PROBLEMS  UNUSUAL RASH Items with * indicate a potential emergency and should be followed up as soon as possible. If you have an emergency after office hours please contact your primary care physician or go to the nearest emergency department.  Please call the clinic during office hours if you have any questions or concerns.   You may also contact the Patient Navigator at [phone] should you have any questions or need assistance in obtaining follow up care.      Resources For Cancer Patients and their Caregivers ? American Cancer Society: Can assist with transportation, wigs, general needs, runs Look Good Feel Better.        (712) 214-3762 ? Cancer Care: Provides financial assistance, online support groups, medication/co-pay assistance.  1-800-813-HOPE 437-340-8381) ? [place] Assists [place] cancer patients and their families through emotional , educational and financial support.  929-521-9144 ? [place] DSS Where to apply for food stamps, Medicaid and utility assistance. 516-630-6394 ? RCATS: Transportation to medical appointments. 671-721-4872 ? Social Security Administration: May apply for disability if have a Stage IV cancer. 702-867-1410 (434)076-5093 ? [employer], Disability and Transit Services: Assists with nutrition, care and transit needs. 726-012-8245

## 2016-03-31 NOTE — Patient Instructions (Addendum)
[facility] at [facility] Discharge Instructions  RECOMMENDATIONS MADE BY THE CONSULTANT AND ANY TEST RESULTS WILL BE SENT TO YOUR REFERRING PHYSICIAN.  You saw Dr. Whitney Muse today. Follow up in 3 weeks with labs and chemo. We will schedule you for head MRI. Increase Prilosec to twice daily. Start Lexapro once a day.  Thank you for choosing [facility] at [facility] to provide your oncology and hematology care.  To afford each patient quality time with our provider, please arrive at least 15 minutes before your scheduled appointment time.   Beginning January 23rd 2017 lab work for the [facility] will be done in the  Main lab at [facility] on 1st floor. If you have a lab appointment with the [facility] please come in thru the  Main Entrance and check in at the main information desk  You need to re-schedule your appointment should you arrive 10 or more minutes late.  We strive to give you quality time with our providers, and arriving late affects you and other patients whose appointments are after yours.  Also, if you no show three or more times for appointments you may be dismissed from the clinic at the providers discretion.     Again, thank you for choosing [facility].  Our hope is that these requests will decrease the amount of time that you wait before being seen by our physicians.       _____________________________________________________________  Should you have questions after your visit to [facility], please contact our office at [phone] between the hours of 8:30 a.m. and 4:30 p.m.  Voicemails left after 4:30 p.m. will not be returned until the following business day.  For prescription refill requests, have your pharmacy contact our office.         Resources For Cancer Patients and their Caregivers ? American Cancer Society: Can assist with transportation, wigs, general needs, runs Look Good  Feel Better.        [phone] ? Cancer Care: Provides financial assistance, online support groups, medication/co-pay assistance.  1-800-813-HOPE 8481495994) ? [place] Assists [place] cancer patients and their families through emotional , educational and financial support.  (581) 369-1720 ? [place] DSS Where to apply for food stamps, Medicaid and utility assistance. 6030596013 ? RCATS: Transportation to medical appointments. 617-023-3277 ? Social Security Administration: May apply for disability if have a Stage IV cancer. 949-586-4145 831-619-0976 ? [employer], Disability and Transit Services: Assists with nutrition, care and transit needs. [facility] Support Programs: @10RELATIVEDAYS @ > Cancer Support Group  2nd Tuesday of the month 1pm-2pm, Journey Room  > Creative Journey  3rd Tuesday of the month 1130am-1pm, Journey Room  > Look Good Feel Better  1st Wednesday of the month 10am-12 noon, Journey Room (Call [facility] to register [phone])

## 2016-03-31 NOTE — Progress Notes (Signed)
..  Matthew [name] arrived today for [employer] on body injector. See MAR for administration details. Injector in place and engaged with green light indicator on flashing. Tolerated application with out problems. Tolerated chemo well. Stable and ambulatory on discharge home with daughter.

## 2016-04-01 ENCOUNTER — Telehealth (HOSPITAL_COMMUNITY): Payer: Self-pay | Admitting: *Deleted

## 2016-04-01 NOTE — Telephone Encounter (Signed)
-----   Message from [facility], PA-C sent at 04/01/2016  9:54 AM EDT ----- PSA is WNL.  Let him know.

## 2016-04-01 NOTE — Telephone Encounter (Signed)
Pt aware of lab results 

## 2016-04-09 ENCOUNTER — Ambulatory Visit (HOSPITAL_COMMUNITY)
Admission: RE | Admit: 2016-04-09 | Discharge: 2016-04-09 | Disposition: A | Discharge status: Home / Self Care | Payer: 59 | Source: Ambulatory Visit | Attending: Hematology & Oncology | Admitting: Hematology & Oncology

## 2016-04-09 DIAGNOSIS — C61 Malignant neoplasm of prostate: Secondary | ICD-10-CM

## 2016-04-09 DIAGNOSIS — R51 Headache: Secondary | ICD-10-CM | POA: Insufficient documentation

## 2016-04-09 MED ORDER — GADOBENATE DIMEGLUMINE 529 MG/ML IV SOLN
20.0000 mL | Freq: Once | INTRAVENOUS | Status: AC | PRN
  Administered 2016-04-09: 20 mL via INTRAVENOUS

## 2016-04-10 ENCOUNTER — Telehealth (HOSPITAL_COMMUNITY): Payer: Self-pay | Admitting: *Deleted

## 2016-04-10 NOTE — Telephone Encounter (Signed)
-----   Message from [name], MD sent at 04/10/2016  2:57 PM EDT ----- Advise head MRI is WNL. Dr.P

## 2016-04-11 NOTE — Telephone Encounter (Signed)
-----   Message from [name], MD sent at 04/10/2016  2:57 PM EDT ----- Advise head MRI is WNL. Dr.P

## 2016-04-14 ENCOUNTER — Ambulatory Visit (HOSPITAL_COMMUNITY): Payer: Self-pay | Admitting: Dentistry

## 2016-04-14 NOTE — Telephone Encounter (Signed)
Multiple attempts to reach the pt and advise of MRI results. Pt not available.

## 2016-04-14 NOTE — Telephone Encounter (Signed)
-----   Message from [name], MD sent at 04/10/2016  2:57 PM EDT ----- Advise head MRI is WNL. Dr.P

## 2016-04-15 ENCOUNTER — Encounter (HOSPITAL_COMMUNITY): Payer: Self-pay | Admitting: Dentistry

## 2016-04-15 ENCOUNTER — Ambulatory Visit (HOSPITAL_COMMUNITY): Payer: Medicaid - Dental | Admitting: Dentistry

## 2016-04-15 VITALS — BP 128/79 | HR 76 | Temp 97.6°F

## 2016-04-15 DIAGNOSIS — K0820 Unspecified atrophy of edentulous alveolar ridge: Secondary | ICD-10-CM | POA: Diagnosis not present

## 2016-04-15 DIAGNOSIS — C7951 Secondary malignant neoplasm of bone: Secondary | ICD-10-CM

## 2016-04-15 DIAGNOSIS — R29898 Other symptoms and signs involving the musculoskeletal system: Secondary | ICD-10-CM

## 2016-04-15 DIAGNOSIS — T148 Other injury of unspecified body region: Secondary | ICD-10-CM | POA: Diagnosis not present

## 2016-04-15 DIAGNOSIS — K08109 Complete loss of teeth, unspecified cause, unspecified class: Secondary | ICD-10-CM

## 2016-04-15 DIAGNOSIS — T148XXD Other injury of unspecified body region, subsequent encounter: Secondary | ICD-10-CM

## 2016-04-15 DIAGNOSIS — M898X9 Other specified disorders of bone, unspecified site: Secondary | ICD-10-CM | POA: Diagnosis not present

## 2016-04-15 DIAGNOSIS — C61 Malignant neoplasm of prostate: Secondary | ICD-10-CM

## 2016-04-15 NOTE — Progress Notes (Signed)
PROGRESS NOTE:  04/15/2016 Matthew [name] [id number]  VITALS: BP 128/79 (BP Location: Left Arm)   Pulse 76   Temp 97.6 F (36.4 C) (Oral)   LABS:  Lab Results  Component Value Date   WBC 9.4 03/31/2016   HGB 13.1 03/31/2016   HCT 40.0 03/31/2016   MCV 91.7 03/31/2016   PLT 279 03/31/2016   BMET    Component Value Date/Time   NA 136 03/31/2016 1010   K 4.3 03/31/2016 1010   CL 104 03/31/2016 1010   CO2 22 03/31/2016 1010   GLUCOSE 145 (H) 03/31/2016 1010   BUN 13 03/31/2016 1010   CREATININE 0.76 03/31/2016 1010   CALCIUM 9.0 03/31/2016 1010   GFRNONAA >60 03/31/2016 1010   GFRAA >60 03/31/2016 1010    Lab Results  Component Value Date   INR 0.96 01/01/2016   INR 1.04 12/24/2015   No results found for: PTT   Matthew [name] is status post extraction OF remaining teeth with alveoloplasty and pre-prosthetic surgery as needed in the operating room with general anesthesia on 03/06/2016. Patient was seen on 03/17/16 for Evaluation of healing and suture removal.  Delayed healing was noted on the mandibular right lingual alveolar ridge area 30-32. Patient was then scheduled for follow-up evaluation in 1 month. Patient now presents for reevaluation of healing.  SUBJECTIVE: Patient with minimal complaints. Patient denies having any problems with tongue irritation. Patient's last chemotherapy was on 03/31/2016 with next dose due on 04/21/2016.  Patient continues to use a soft diet at this time.  EXAM: The patient is edentulous. There is atrophy of the edentulous alveolar ridges. There is an area of exposed bone measuring approximately 2 mm x 8 mm involving the lower right lingual alveolar ridge area #30 through 32. There is no evidence of trauma to the right lateral tongue.   ASSESSMENT: The patient is now edentulous. There is atrophy of the edentulous alveolar ridges. There is exposed mandible in the area of #30-32.   PLAN: 1.  We discussed the risks, benefits, and  complications associated with various treatment options at this time to include no treatment, continued chlorhexidine rinses, partial ostectomy today or in the future.  Patient currently refuses partial ostectomy procedure today. Patient instead will return to dental medicine in approximately 1 month for re-evaluation of the exposed bone with consideration for sequestrectomy at that time.  In the meantime, the patient will continue to use chlorhexidine rinses 3 times daily after breakfast, lunch, and at bedtime.  2. Continue soft diet at this time. 3. Return to clinic in 1 month for reevaluation of healing. Call if problems arise before then. 4. We will suggest continued holding of Xgeva therapy for at least 1-2 months to allow for healing of the lower right lingual area #30-32.   Lenn Cal, DDS

## 2016-04-18 ENCOUNTER — Encounter (HOSPITAL_COMMUNITY): Payer: Self-pay

## 2016-04-18 ENCOUNTER — Encounter (HOSPITAL_COMMUNITY): Payer: 59 | Attending: Hematology & Oncology

## 2016-04-18 VITALS — BP 106/75 | HR 86 | Temp 98.3°F | Resp 22

## 2016-04-18 DIAGNOSIS — C7951 Secondary malignant neoplasm of bone: Secondary | ICD-10-CM

## 2016-04-18 DIAGNOSIS — Z7982 Long term (current) use of aspirin: Secondary | ICD-10-CM | POA: Insufficient documentation

## 2016-04-18 DIAGNOSIS — Z5111 Encounter for antineoplastic chemotherapy: Secondary | ICD-10-CM | POA: Diagnosis not present

## 2016-04-18 DIAGNOSIS — F17210 Nicotine dependence, cigarettes, uncomplicated: Secondary | ICD-10-CM | POA: Insufficient documentation

## 2016-04-18 DIAGNOSIS — R634 Abnormal weight loss: Secondary | ICD-10-CM | POA: Insufficient documentation

## 2016-04-18 DIAGNOSIS — Z79899 Other long term (current) drug therapy: Secondary | ICD-10-CM | POA: Insufficient documentation

## 2016-04-18 DIAGNOSIS — M1990 Unspecified osteoarthritis, unspecified site: Secondary | ICD-10-CM | POA: Insufficient documentation

## 2016-04-18 DIAGNOSIS — C61 Malignant neoplasm of prostate: Secondary | ICD-10-CM

## 2016-04-18 DIAGNOSIS — R3911 Hesitancy of micturition: Secondary | ICD-10-CM | POA: Insufficient documentation

## 2016-04-18 DIAGNOSIS — R972 Elevated prostate specific antigen [PSA]: Secondary | ICD-10-CM | POA: Insufficient documentation

## 2016-04-18 DIAGNOSIS — I82409 Acute embolism and thrombosis of unspecified deep veins of unspecified lower extremity: Secondary | ICD-10-CM | POA: Insufficient documentation

## 2016-04-18 MED ORDER — HYDROCODONE-ACETAMINOPHEN 10-325 MG PO TABS: 1.0000 | ORAL_TABLET | ORAL | 0 refills | Status: DC | PRN

## 2016-04-18 MED ORDER — FENTANYL 50 MCG/HR TD PT72: 50.0000 ug | MEDICATED_PATCH | TRANSDERMAL | 0 refills | Status: DC

## 2016-04-18 MED ORDER — DEGARELIX ACETATE 80 MG ~~LOC~~ SOLR
80.0000 mg | Freq: Once | SUBCUTANEOUS | Status: AC
  Administered 2016-04-18: 80 mg via SUBCUTANEOUS
  Filled 2016-04-18: qty 4

## 2016-04-18 NOTE — Patient Instructions (Signed)
[place] at [facility] Discharge Instructions  RECOMMENDATIONS MADE BY THE CONSULTANT AND ANY TEST RESULTS WILL BE SENT TO YOUR REFERRING PHYSICIAN.  You were given a Firmagon injection today. Return as scheduled.   Thank you for choosing [place] at [facility] to provide your oncology and hematology care.  To afford each patient quality time with our provider, please arrive at least 15 minutes before your scheduled appointment time.   Beginning January 23rd 2017 lab work for the [facility] will be done in the  Main lab at [facility] on 1st floor. If you have a lab appointment with the [place] please come in thru the  Main Entrance and check in at the main information desk  You need to re-schedule your appointment should you arrive 10 or more minutes late.  We strive to give you quality time with our providers, and arriving late affects you and other patients whose appointments are after yours.  Also, if you no show three or more times for appointments you may be dismissed from the clinic at the providers discretion.     Again, thank you for choosing [facility].  Our hope is that these requests will decrease the amount of time that you wait before being seen by our physicians.       _____________________________________________________________  Should you have questions after your visit to [facility], please contact our office at [phone] between the hours of 8:30 a.m. and 4:30 p.m.  Voicemails left after 4:30 p.m. will not be returned until the following business day.  For prescription refill requests, have your pharmacy contact our office.         Resources For Cancer Patients and their Caregivers ? American Cancer Society: Can assist with transportation, wigs, general needs, runs Look Good Feel Better.        313 754 6519 ? Cancer Care: Provides financial assistance, online support  groups, medication/co-pay assistance.  1-800-813-HOPE 863-384-9035) ? [place] Assists [place] cancer patients and their families through emotional , educational and financial support.  (313)396-7057 ? [place] DSS Where to apply for food stamps, Medicaid and utility assistance. 878-375-9392 ? [facility]: Transportation to medical appointments. (303)564-5256 ? Social Security Administration: May apply for disability if have a Stage IV cancer. 916-588-6539 424 061 5175 ? [employer], Disability and Transit Services: Assists with nutrition, care and transit needs. [place] Support Programs: @10RELATIVEDAYS @ > Cancer Support Group  2nd Tuesday of the month 1pm-2pm, Journey Room  > Creative Journey  3rd Tuesday of the month 1130am-1pm, Journey Room  > Look Good Feel Better  1st Wednesday of the month 10am-12 noon, Journey Room (Call [place] to register [phone])

## 2016-04-18 NOTE — Progress Notes (Signed)
 [facility]  PROGRESS NOTE  Patient Care Team: John Golding, MD as PCP - General (Family Medicine)  CHIEF COMPLAINTS:  CT angiography chest w/contrast 12/10/2015 multiple thoracic and rib osseous lesions suggesting osseous metastatic disease. No obvious primary PSA 12/12/2015 153.85    Prostate carcinoma (HCC)   12/10/2015 Imaging    CTA chest, multiple thoracic and rib osseous lesions. no primary evident. Atherosclerosis including aortic and CAD      12/12/2015 Imaging    CT abdomen/pelvis with sclerotic osseous lesions througout the lumbar spine, sacrum, bilateral iliac bones and L proximal femur worrisome for sclerotic osseous mets,mild prostatomegaly, no LAD, Infrarenal 3.8 cm AAA      12/12/2015 Tumor Marker    PSA 153.85      12/18/2015 Imaging    Bone Scan Diffuse bony metastatic disease, posterior calvarium, sternum, thoracic, lumbar spine, bilateral ribs, L shoulder, bilateral bony pelvis, proximal L femur      12/24/2015 Initial Biopsy    CT biopsy of L iliac bone lesion      12/26/2015 Pathology Results    Bone, biopsy, left iliac - POSITIVE FOR METASTATIC ADENOCARCINOMA.      12/28/2015 -  Chemotherapy    Firmagon instituted 240 mg       01/01/2016 Procedure    Port placed by IR.      01/07/2016 -  Chemotherapy    Docetaxel every 21 days      02/18/2016 Adverse Reaction    GI toxicity from docetaxel, dose reduced to 60 mg/m2      02/18/2016 Treatment Plan Change    Docetaxel dose reduced by 20%      03/06/2016 Procedure    Dr. Kulinski- Multiple extraction of tooth numbers 3, 5, 6, 8, 9, 10, 11, 12, 20, 21, 22, 27, 28, and 31. 4 Quadrants of alveoloplasty. Bilateral mandibular lingual tori reductions.      04/09/2016 Imaging    Brain MRI 1. No intracranial metastatic disease 2. No acute intracranial abnormality 3. Findings of chronic microvascular disease        HISTORY OF PRESENTING ILLNESS:  Matthew [name] 60 y.o. male is here for  further follow-up of stage IV prostate cancer. He is on firmagon (which he notes drastically improved his pain) taxotere and prednisone.   Mr. Marina is accompanied by his daughter and presents in treatment chair. He is here for Cycle #6 Docetaxel. I personally reviewed and went over recent brain MRI results with the patient.   He feels pretty good right now. He mentions his appetite comes and goes.  He reports severe chest pain/sternal pain the other night which woke him up. Since this episode he has had no issues like that again. He notes really hurting on Friday. He states the pain patches really help keep his pain under control. He asks why it seems that the medication is wearing off faster. He takes 2 to 4 pain pills, as needed.  He reports his pain is mostly in his chest and across his neck. If he tries to lift anything over 15 lbs, "I'm just wooped". He has been doing weight lifting exercises with small dumbbells to strengthen his arms.    He states some interest in having his prostate taken out and wonders if this is even necessary.    He continues to smoke 1 ppd.   He denies any bowel issues. No abdominal pain or cramping. No urinary retention. No hematuria. Weight is stable.    MEDICAL   HISTORY:  Past Medical History:  Diagnosis Date  . Arthritis    Left knee  . Bone metastases ([place]) 12/26/2015  . Cancer [facility])    Prostate  . DVT (deep venous thrombosis) ([place]) 2004  . Kidney stones    ~2002  . Prostate carcinoma ([place]) 12/26/2015    SURGICAL HISTORY: Past Surgical History:  Procedure Laterality Date  . CIRCUMCISION  30 years ago  . MULTIPLE EXTRACTIONS WITH ALVEOLOPLASTY N/A 03/06/2016   Procedure: Extraction of tooth #'s 3,5,6,8-12,20-22, 27, 28, and 31 with alveoloplasty and bilateral mandibular tori reductions;  Surgeon: Lenn Cal, DDS;  Location: [facility];  Service: Oral Surgery;  Laterality: N/A;    SOCIAL HISTORY: Social History   Social History  . Marital  status: Single    Spouse name: N/A  . Number of children: 2  . Years of education: N/A   Occupational History  . Construction/carpenter    Social History Main Topics  . Smoking status: Current Every Day Smoker    Packs/day: 1.00    Years: 50.00    Types: Cigarettes  . Smokeless tobacco: Former Systems developer    Types: Chew    Quit date: 08/04/2005  . Alcohol use No  . Drug use:     Types: Marijuana     Comment: THC for appetite  . Sexual activity: Not on file   Other Topics Concern  . Not on file   Social History Narrative  . No narrative on file  Divorced for 9 years 2 girls 4 grandchildren Smokes pack a day; Started at 60 years old Beer every once in a while  Hobbies-plays pool Works in Architect  FAMILY HISTORY: Family History  Problem Relation Age of Onset  . Cancer Mother     Cory Roughen (adopted him) Mother died in 67s from bone cancer Never met father Brothers and sisters all died from cancer 3 brother 3 sisters  ALLERGIES:  is allergic to meloxicam.  MEDICATIONS:  Current Outpatient Prescriptions  Medication Sig Dispense Refill  . Calcium Carb-Cholecalciferol (CALCIUM 500 + D3) 500-600 MG-UNIT TABS Take 2 tablets by mouth daily. 60 tablet 6  . calcium carbonate (OS-CAL) 1250 (500 Ca) MG chewable tablet Chew 1 tablet by mouth daily as needed for heartburn.    . chlorhexidine (PERIDEX) 0.12 % solution Rinse with 15 mls three times daily for 30 seconds. Use after breakfast, lunch, and at bedtime. Spit out excess. Do not swallow. 960 mL prn  . Degarelix Acetate (FIRMAGON [place]) Inject into the skin every 28 (twenty-eight) days. Reported on 12/28/2015    . dexamethasone (DECADRON) 4 MG tablet The day before, day of, and day after chemo take 2 tabs in am and 2 tabs in pm. Take with food. 36 tablet 1  . DOCEtaxel (TAXOTERE IV) Inject into the vein. Has not started yet, to be given every 21 days    . ergocalciferol (VITAMIN D2) 50000 units capsule Take 1 capsule (50,000  Units total) by mouth every Friday. 30 capsule 2  . fentaNYL (DURAGESIC - DOSED MCG/HR) 50 MCG/HR Place 1 patch (50 mcg total) onto the skin every 3 (three) days. 10 patch 0  . HYDROcodone-acetaminophen (NORCO) 10-325 MG tablet Take 1 tablet by mouth every 4 (four) hours as needed. 60 tablet 0  . lidocaine-prilocaine (EMLA) cream Apply a quarter size amount to port site 1 hour prior to chemo. Do not rub in. Cover with plastic wrap. 30 g 3  . omeprazole (PRILOSEC) 40 MG capsule Take 1  capsule (40 mg total) by mouth 2 (two) times daily. 60 capsule 3  . ondansetron (ZOFRAN) 8 MG tablet Take 1 tablet (8 mg total) by mouth every 8 (eight) hours as needed for nausea or vomiting. 30 tablet 2  . pegfilgrastim (NEULASTA ONPRO) 6 MG/0.6ML injection Inject 6 mg into the skin every 21 ( twenty-one) days. Inject via provided programmed delivery device.    . predniSONE (DELTASONE) 5 MG tablet Take 1 tablet (5 mg total) by mouth daily with breakfast. 60 tablet 1  . prochlorperazine (COMPAZINE) 10 MG tablet Take 1 tablet (10 mg total) by mouth every 6 (six) hours as needed for nausea or vomiting. 30 tablet 2  . silver sulfADIAZINE (SILVADENE) 1 % cream Apply 1 application topically 3 (three) times daily. 400 g 0  . escitalopram (LEXAPRO) 10 MG tablet Take 1 tablet (10 mg total) by mouth daily. (Patient not taking: Reported on 04/21/2016) 30 tablet 3   No current facility-administered medications for this visit.    Facility-Administered Medications Ordered in Other Visits  Medication Dose Route Frequency Provider Last Rate Last Dose  . 0.9 %  sodium chloride infusion   Intravenous Once Shannon K Penland, MD      . DOCEtaxel (TAXOTERE) 140 mg in dextrose 5 % 250 mL chemo infusion  60 mg/m2 (Treatment Plan Recorded) Intravenous Once Shannon K Penland, MD      . heparin lock flush 100 unit/mL  500 Units Intracatheter Once PRN Shannon K Penland, MD      . ondansetron (ZOFRAN) 8 mg, dexamethasone (DECADRON) 10 mg in  sodium chloride 0.9 % 50 mL IVPB   Intravenous Once Shannon K Penland, MD      . pegfilgrastim (NEULASTA ONPRO KIT) injection 6 mg  6 mg Subcutaneous Once Shannon K Penland, MD      . sodium chloride flush (NS) 0.9 % injection 10 mL  10 mL Intracatheter PRN Shannon K Penland, MD      . sodium chloride flush (NS) 0.9 % injection 3 mL  3 mL Intravenous PRN Shannon K Penland, MD        Review of Systems  Constitutional: Negative for weight loss.  Eyes: Negative.   Respiratory: Negative.   Cardiovascular: Positive for chest pain.       Occasional chest cramping  Gastrointestinal: Positive for heartburn and nausea. Negative for abdominal pain, diarrhea and vomiting.  Musculoskeletal: Positive for joint pain and neck pain.  Skin: Negative.   Neurological: Positive for dizziness, tingling and headaches.  Endo/Heme/Allergies: Negative.   Psychiatric/Behavioral: Negative.   All other systems reviewed and are negative. 14 point ROS was done and is otherwise as detailed above or in HPI   PHYSICAL EXAMINATION: ECOG PERFORMANCE STATUS: 1 - Symptomatic but completely ambulatory   Vitals with BMI 04/21/2016  Height   Weight 235 lbs  BMI   Systolic 126  Diastolic 69  Pulse 80  Respirations 20   Physical Exam  Constitutional: He is oriented to person, place, and time and well-developed, well-nourished, and in no distress.  In treatment chair  HENT:  Head: Normocephalic and atraumatic.  Nose: Nose normal.  Mouth/Throat: Oropharynx is clear and moist. No oropharyngeal exudate.  Edentulous  Eyes: Conjunctivae and EOM are normal. Pupils are equal, round, and reactive to light. Right eye exhibits no discharge. Left eye exhibits no discharge. No scleral icterus.  Neck: Normal range of motion. Neck supple. No tracheal deviation present. No thyromegaly present.  Cardiovascular: Normal rate, regular rhythm and   normal heart sounds.  Exam reveals no gallop and no friction rub.   No murmur  heard. Pulmonary/Chest: Effort normal. He has no wheezes. He has no rales.  Decreased throughout but clear  Abdominal: Soft. Bowel sounds are normal. He exhibits no distension and no mass. There is no tenderness. There is no rebound and no guarding.  Musculoskeletal: Normal range of motion. He exhibits no edema.  Lymphadenopathy:    He has no cervical adenopathy.  Neurological: He is alert and oriented to person, place, and time. He has normal reflexes. No cranial nerve deficit. Gait normal. Coordination normal.  Skin: Skin is warm and dry. No rash noted.  Psychiatric: Mood, memory, affect and judgment normal.  Nursing note and vitals reviewed.  LABORATORY DATA:  I have reviewed the data as listed  CBC    Component Value Date/Time   WBC 11.9 (H) 04/21/2016 1032   RBC 3.85 (L) 04/21/2016 1032   HGB 11.9 (L) 04/21/2016 1032   HCT 35.5 (L) 04/21/2016 1032   PLT 231 04/21/2016 1032   MCV 92.2 04/21/2016 1032   MCH 30.9 04/21/2016 1032   MCHC 33.5 04/21/2016 1032   RDW 15.6 (H) 04/21/2016 1032   LYMPHSABS 0.6 (L) 04/21/2016 1032   MONOABS 0.6 04/21/2016 1032   EOSABS 0.0 04/21/2016 1032   BASOSABS 0.0 04/21/2016 1032   CMP     Component Value Date/Time   NA 135 04/21/2016 1032   K 4.4 04/21/2016 1032   CL 105 04/21/2016 1032   CO2 22 04/21/2016 1032   GLUCOSE 184 (H) 04/21/2016 1032   BUN 13 04/21/2016 1032   CREATININE 0.75 04/21/2016 1032   CALCIUM 8.9 04/21/2016 1032   PROT 6.6 04/21/2016 1032   ALBUMIN 3.6 04/21/2016 1032   AST 18 04/21/2016 1032   ALT 19 04/21/2016 1032   ALKPHOS 101 04/21/2016 1032   BILITOT 0.4 04/21/2016 1032   GFRNONAA >60 04/21/2016 1032   GFRAA >60 04/21/2016 1032   Results for Kithcart, Nilesh J (MRN [id number]) as of 05/01/2016 21:05  Ref. Range 01/28/2016 09:15 02/18/2016 09:59 03/10/2016 10:17 03/31/2016 10:10 04/21/2016 10:32  PSA Latest Ref Range: 0.00 - 4.00 ng/mL 33.38 (H) 12.57 (H) 6.17 (H) 3.12 2.10     RADIOGRAPHIC STUDIES: I have  personally reviewed the radiological images as listed and agreed with the findings in the report. Study Result   CLINICAL DATA:  Prostate cancer with severe headaches. Evaluation for metastases.  EXAM: MRI HEAD WITHOUT AND WITH CONTRAST  TECHNIQUE: Multiplanar, multiecho pulse sequences of the brain and surrounding structures were obtained without and with intravenous contrast.  CONTRAST:  20mL MULTIHANCE GADOBENATE DIMEGLUMINE 529 MG/ML IV SOLN  COMPARISON:  None.  FINDINGS: Despite efforts by the technologist and patient, motion artifact is present on today's examination and could not be eliminated. This reduces the sensitivity and specificity of the study.  Brain: No acute infarct or intraparenchymal hemorrhage. The midline structures are normal. There is multifocal hyperintense T2 weighted signal within the periventricular and subcortical white matter. No mass lesion or midline shift. No contrast-enhancing lesions. No hydrocephalus or extra-axial fluid collection.  Vascular: Major intracranial flow voids are preserved. No evidence of chronic microhemorrhage or amyloid angiopathy.  Skull and upper cervical spine: The visualized skull base, calvarium, upper cervical spine and extracranial soft tissues are normal.  Sinuses/Orbits: No fluid levels or advanced mucosal thickening. No mastoid effusion. Normal orbits.  IMPRESSION: 1. No intracranial metastatic disease. 2. No acute intracranial abnormality. 3. Findings of chronic microvascular   disease.   Electronically Signed   By: Ulyses Jarred M.D.   On: 04/09/2016 23:41     PATHOLOGY   ASSESSMENT & PLAN:  Stage IV adenocarcinoma of prostate, hormone sensitive disease Bony Metastatic Disease Abnormal CT imaging Elevated PSA Urinary hesitancy, frequency Tobacco Abuse Cancer related Pain Weight loss GERD Situational Depression headache   The patient is here for Cycle #6 Docetaxel.  04/09/2016  Brain MRI reviewed.  I spoke with Dr. Drexel Iha about the patient's case. Will continue to hold XGEVA until cleared.   The patient continues to complain of chest and neck pain. I spoke with the patient about potential for palliative radiation treatment for continued pain. I have increased his fentanyl patch dose to 75 mcg. He has sternal disease, I would however prefer not to have this area radiated to preserve marrow. His pain is markedly improved since diagnosis.  I would recommend cardiology evaluation given findings on CTA from May and his heavy tobacco use. We will discuss at his follow-up.   I have ordered repeat Chest CT.   He will return for follow up post scans to discuss these results. We will transition him from firmagon in the near future.  Orders Placed This Encounter  Procedures  . CT Abdomen Pelvis W Contrast    Standing Status:   Future    Standing Expiration Date:   04/21/2017    Order Specific Question:   If indicated for the ordered procedure, I authorize the administration of contrast media per Radiology protocol    Answer:   Yes    Order Specific Question:   Reason for Exam (SYMPTOM  OR DIAGNOSIS REQUIRED)    Answer:   restaging stage IV prostate cancer    Order Specific Question:   Preferred imaging location?    Answer:   [facility]  . CT Chest W Contrast    Standing Status:   Future    Standing Expiration Date:   04/21/2017    Order Specific Question:   If indicated for the ordered procedure, I authorize the administration of contrast media per Radiology protocol    Answer:   Yes    Order Specific Question:   Reason for Exam (SYMPTOM  OR DIAGNOSIS REQUIRED)    Answer:   restaging stage IV prostate cancer    Order Specific Question:   Preferred imaging location?    Answer:   [facility]    Standing Status:   Future    Standing Expiration Date:   04/21/2017    Order Specific Question:   Reason for Exam (SYMPTOM  OR  DIAGNOSIS REQUIRED)    Answer:   restaging stage IV prostate cancer    Order Specific Question:   Preferred imaging location?    Answer:   [facility]    Order Specific Question:   If indicated for the ordered procedure, I authorize the administration of a radiopharmaceutical per Radiology protocol    Answer:   Yes  . CBC with Differential    Standing Status:   Future    Standing Expiration Date:   05/01/2017  . Comprehensive metabolic panel    Standing Status:   Future    Standing Expiration Date:   05/01/2017  . PSA    Standing Status:   Future    Standing Expiration Date:   05/01/2017  . Testosterone, free    Standing Status:   Future    Standing Expiration  Date:   05/01/2017  . Testosterone    Standing Status:   Future    Standing Expiration Date:   05/01/2017    All questions were answered. The patient knows to call the clinic with any problems, questions or concerns.  This document serves as a record of services personally performed by Ancil Linsey, MD. It was created on her behalf by Arlyce Harman, a trained medical scribe. The creation of this record is based on the scribe's personal observations and the provider's statements to them. This document has been checked and approved by the attending provider.  I have reviewed the above documentation for accuracy and completeness and I agree with the above.  This note was electronically signed.    Molli Hazard, MD  04/21/2016 11:49 AM

## 2016-04-18 NOTE — Progress Notes (Signed)
Pt here today for his firmagon injection. Pt given injection in right lower abdomen. Pt tolerated well. Pt stable and discharged home ambulatory. Pt to return as scheduled.

## 2016-04-21 ENCOUNTER — Encounter (HOSPITAL_COMMUNITY): Payer: Self-pay | Admitting: Hematology & Oncology

## 2016-04-21 ENCOUNTER — Ambulatory Visit (HOSPITAL_COMMUNITY): Payer: Self-pay

## 2016-04-21 ENCOUNTER — Encounter (HOSPITAL_BASED_OUTPATIENT_CLINIC_OR_DEPARTMENT_OTHER): Payer: 59 | Admitting: Hematology & Oncology

## 2016-04-21 ENCOUNTER — Encounter (HOSPITAL_BASED_OUTPATIENT_CLINIC_OR_DEPARTMENT_OTHER): Payer: 59

## 2016-04-21 VITALS — BP 118/70 | HR 76 | Temp 98.1°F | Resp 20 | Wt 235.0 lb

## 2016-04-21 DIAGNOSIS — R972 Elevated prostate specific antigen [PSA]: Secondary | ICD-10-CM | POA: Diagnosis not present

## 2016-04-21 DIAGNOSIS — I82409 Acute embolism and thrombosis of unspecified deep veins of unspecified lower extremity: Secondary | ICD-10-CM | POA: Diagnosis not present

## 2016-04-21 DIAGNOSIS — R634 Abnormal weight loss: Secondary | ICD-10-CM | POA: Diagnosis not present

## 2016-04-21 DIAGNOSIS — C7951 Secondary malignant neoplasm of bone: Secondary | ICD-10-CM | POA: Diagnosis not present

## 2016-04-21 DIAGNOSIS — Z79899 Other long term (current) drug therapy: Secondary | ICD-10-CM | POA: Diagnosis not present

## 2016-04-21 DIAGNOSIS — G893 Neoplasm related pain (acute) (chronic): Secondary | ICD-10-CM

## 2016-04-21 DIAGNOSIS — R3911 Hesitancy of micturition: Secondary | ICD-10-CM | POA: Diagnosis not present

## 2016-04-21 DIAGNOSIS — C61 Malignant neoplasm of prostate: Secondary | ICD-10-CM | POA: Diagnosis not present

## 2016-04-21 DIAGNOSIS — Z5111 Encounter for antineoplastic chemotherapy: Secondary | ICD-10-CM | POA: Diagnosis not present

## 2016-04-21 DIAGNOSIS — F17210 Nicotine dependence, cigarettes, uncomplicated: Secondary | ICD-10-CM | POA: Diagnosis not present

## 2016-04-21 DIAGNOSIS — M1990 Unspecified osteoarthritis, unspecified site: Secondary | ICD-10-CM | POA: Diagnosis not present

## 2016-04-21 DIAGNOSIS — Z720 Tobacco use: Secondary | ICD-10-CM | POA: Diagnosis not present

## 2016-04-21 DIAGNOSIS — Z7982 Long term (current) use of aspirin: Secondary | ICD-10-CM | POA: Diagnosis not present

## 2016-04-21 LAB — COMPREHENSIVE METABOLIC PANEL
ALK PHOS: 101 U/L (ref 38–126)
ALT: 19 U/L (ref 17–63)
ANION GAP: 8 (ref 5–15)
AST: 18 U/L (ref 15–41)
Albumin: 3.6 g/dL (ref 3.5–5.0)
BUN: 13 mg/dL (ref 6–20)
CALCIUM: 8.9 mg/dL (ref 8.9–10.3)
CO2: 22 mmol/L (ref 22–32)
Chloride: 105 mmol/L (ref 101–111)
Creatinine, Ser: 0.75 mg/dL (ref 0.61–1.24)
GFR calc non Af Amer: >60 mL/min (ref >60)
Glucose, Bld: 184 mg/dL — ABNORMAL HIGH (ref 65–99)
POTASSIUM: 4.4 mmol/L (ref 3.5–5.1)
SODIUM: 135 mmol/L (ref 135–145)
TOTAL PROTEIN: 6.6 g/dL (ref 6.5–8.1)
Total Bilirubin: 0.4 mg/dL (ref 0.3–1.2)

## 2016-04-21 LAB — CBC WITH DIFFERENTIAL/PLATELET
Basophils Absolute: 0 10*3/uL (ref 0.0–0.1)
Basophils Relative: 0 %
Eosinophils Absolute: 0 10*3/uL (ref 0.0–0.7)
Eosinophils Relative: 0 %
HEMATOCRIT: 35.5 % — AB (ref 39.0–52.0)
Hemoglobin: 11.9 g/dL — ABNORMAL LOW (ref 13.0–17.0)
LYMPHS PCT: 5 %
Lymphs Abs: 0.6 10*3/uL — ABNORMAL LOW (ref 0.7–4.0)
MCH: 30.9 pg (ref 26.0–34.0)
MCHC: 33.5 g/dL (ref 30.0–36.0)
MCV: 92.2 fL (ref 78.0–100.0)
MONO ABS: 0.6 10*3/uL (ref 0.1–1.0)
MONOS PCT: 5 %
NEUTROS ABS: 10.6 10*3/uL — AB (ref 1.7–7.7)
Neutrophils Relative %: 90 %
Platelets: 231 10*3/uL (ref 150–400)
RBC: 3.85 MIL/uL — ABNORMAL LOW (ref 4.22–5.81)
RDW: 15.6 % — AB (ref 11.5–15.5)
WBC: 11.9 10*3/uL — ABNORMAL HIGH (ref 4.0–10.5)

## 2016-04-21 LAB — PSA: PSA: 2.1 ng/mL (ref 0.00–4.00)

## 2016-04-21 MED ORDER — DEXTROSE 5 % IV SOLN
60.0000 mg/m2 | Freq: Once | INTRAVENOUS | Status: AC
  Administered 2016-04-21: 140 mg via INTRAVENOUS
  Filled 2016-04-21: qty 14

## 2016-04-21 MED ORDER — SODIUM CHLORIDE 0.9% FLUSH: 3.0000 mL | INTRAVENOUS | Status: DC | PRN

## 2016-04-21 MED ORDER — FENTANYL 25 MCG/HR TD PT72: 25.0000 ug | MEDICATED_PATCH | TRANSDERMAL | 0 refills | Status: DC

## 2016-04-21 MED ORDER — SODIUM CHLORIDE 0.9% FLUSH
10.0000 mL | INTRAVENOUS | Status: DC | PRN
  Administered 2016-04-21: 10 mL
  Filled 2016-04-21: qty 10

## 2016-04-21 MED ORDER — HEPARIN SOD (PORK) LOCK FLUSH 100 UNIT/ML IV SOLN
500.0000 [IU] | Freq: Once | INTRAVENOUS | Status: AC | PRN
  Administered 2016-04-21: 500 [IU]
  Filled 2016-04-21: qty 5

## 2016-04-21 MED ORDER — PEGFILGRASTIM 6 MG/0.6ML ~~LOC~~ PSKT
6.0000 mg | PREFILLED_SYRINGE | Freq: Once | SUBCUTANEOUS | Status: AC
  Administered 2016-04-21: 6 mg via SUBCUTANEOUS
  Filled 2016-04-21: qty 0.6

## 2016-04-21 MED ORDER — SODIUM CHLORIDE 0.9 % IV SOLN
Freq: Once | INTRAVENOUS | Status: AC
  Administered 2016-04-21: 11:00:00 via INTRAVENOUS

## 2016-04-21 MED ORDER — LORAZEPAM 2 MG/ML IJ SOLN
0.5000 mg | Freq: Once | INTRAMUSCULAR | Status: AC
  Administered 2016-04-21: 0.5 mg via INTRAVENOUS
  Filled 2016-04-21: qty 1

## 2016-04-21 MED ORDER — DEXAMETHASONE SODIUM PHOSPHATE 100 MG/10ML IJ SOLN
Freq: Once | INTRAMUSCULAR | Status: AC
  Administered 2016-04-21: 12:00:00 via INTRAVENOUS
  Filled 2016-04-21: qty 4

## 2016-04-21 NOTE — Progress Notes (Signed)
Matthew [name] tolerated chemo tx well without incident or complaints. Pt refused Influenza vaccine for today. VSS upon discharge. Pt discharged self ambulatory in satisfactory condition with daughter

## 2016-04-21 NOTE — Patient Instructions (Signed)
[place] at [facility] Discharge Instructions  RECOMMENDATIONS MADE BY THE CONSULTANT AND ANY TEST RESULTS WILL BE SENT TO YOUR REFERRING PHYSICIAN.  You saw Dr. Whitney Muse today. Follow up in 4 weeks, after scans, with lab work. Continue [place].  Thank you for choosing [place] at [facility] to provide your oncology and hematology care.  To afford each patient quality time with our provider, please arrive at least 15 minutes before your scheduled appointment time.   Beginning January 23rd 2017 lab work for the [facility] will be done in the  Main lab at [facility] on 1st floor. If you have a lab appointment with the [facility] please come in thru the  Main Entrance and check in at the main information desk  You need to re-schedule your appointment should you arrive 10 or more minutes late.  We strive to give you quality time with our providers, and arriving late affects you and other patients whose appointments are after yours.  Also, if you no show three or more times for appointments you may be dismissed from the clinic at the providers discretion.     Again, thank you for choosing [facility].  Our hope is that these requests will decrease the amount of time that you wait before being seen by our physicians.       _____________________________________________________________  Should you have questions after your visit to [facility], please contact our office at [phone] between the hours of 8:30 a.m. and 4:30 p.m.  Voicemails left after 4:30 p.m. will not be returned until the following business day.  For prescription refill requests, have your pharmacy contact our office.         Resources For Cancer Patients and their Caregivers ? American Cancer Society: Can assist with transportation, wigs, general needs, runs Look Good Feel Better.        ([phone] ? Cancer Care: Provides financial  assistance, online support groups, medication/co-pay assistance.  1-800-813-HOPE 306-617-2162) ? [place] Assists [place] cancer patients and their families through emotional , educational and financial support.  (647)369-1483 ? [place] DSS Where to apply for food stamps, Medicaid and utility assistance. 334-291-3103 ? RCATS: Transportation to medical appointments. 956-502-4269 ? Social Security Administration: May apply for disability if have a Stage IV cancer. [phone] 8485944900 ? [employer], Disability and Transit Services: Assists with nutrition, care and transit needs. [place] Support Programs: @10RELATIVEDAYS @ > Cancer Support Group  2nd Tuesday of the month 1pm-2pm, Journey Room  > Creative Journey  3rd Tuesday of the month 1130am-1pm, Journey Room  > Look Good Feel Better  1st Wednesday of the month 10am-12 noon, Journey Room (Call [place] to register [phone])

## 2016-04-21 NOTE — Patient Instructions (Signed)
[facility] Discharge Instructions for Patients Receiving Chemotherapy   Beginning January 23rd 2017 lab work for the Cancer Center will be done in the  Main lab at [facility] on 1st floor. If you have a lab appointment with the Cancer Center please come in thru the  Main Entrance and check in at the main information desk   Today you received the following chemotherapy agents Taxotere. Follow-up as scheduled. Call clinic for any questions or concerns  To help prevent nausea and vomiting after your treatment, we encourage you to take your nausea medication   If you develop nausea and vomiting, or diarrhea that is not controlled by your medication, call the clinic.  The clinic phone number is [phone]. Office hours are Monday-Friday 8:30am-5:00pm.  BELOW ARE SYMPTOMS THAT SHOULD BE REPORTED IMMEDIATELY:  *FEVER GREATER THAN 101.0 F  *CHILLS WITH OR WITHOUT FEVER  NAUSEA AND VOMITING THAT IS NOT CONTROLLED WITH YOUR NAUSEA MEDICATION  *UNUSUAL SHORTNESS OF BREATH  *UNUSUAL BRUISING OR BLEEDING  TENDERNESS IN MOUTH AND THROAT WITH OR WITHOUT PRESENCE OF ULCERS  *URINARY PROBLEMS  *BOWEL PROBLEMS  UNUSUAL RASH Items with * indicate a potential emergency and should be followed up as soon as possible. If you have an emergency after office hours please contact your primary care physician or go to the nearest emergency department.  Please call the clinic during office hours if you have any questions or concerns.   You may also contact the Patient Navigator at [phone] should you have any questions or need assistance in obtaining follow up care.      Resources For Cancer Patients and their Caregivers ? American Cancer Society: Can assist with transportation, wigs, general needs, runs Look Good Feel Better.        1-888-227-6333 ? Cancer Care: Provides financial assistance, online support groups, medication/co-pay assistance.  1-800-813-HOPE  (4673) ? [facility] Assists [place] cancer patients and their families through emotional , educational and financial support.  336-427-4357 ? [place] DSS Where to apply for food stamps, Medicaid and utility assistance. 336-342-1394 ? RCATS: Transportation to medical appointments. 336-347-2287 ? Social Security Administration: May apply for disability if have a Stage IV cancer. 336-342-7796 1-800-772-1213 ? [place] Aging, Disability and Transit Services: Assists with nutrition, care and transit needs. 336-349-2343         

## 2016-04-28 ENCOUNTER — Other Ambulatory Visit (HOSPITAL_COMMUNITY): Payer: Self-pay

## 2016-04-28 DIAGNOSIS — C61 Malignant neoplasm of prostate: Secondary | ICD-10-CM

## 2016-04-28 MED ORDER — PREDNISONE 5 MG PO TABS: 5.0000 mg | ORAL_TABLET | Freq: Every day | ORAL | 1 refills | Status: DC

## 2016-04-28 NOTE — Telephone Encounter (Signed)
Received refill request for prednisone for patient. Chart checked and was refilled.

## 2016-05-12 ENCOUNTER — Encounter (HOSPITAL_COMMUNITY)
Admission: RE | Admit: 2016-05-12 | Discharge: 2016-05-12 | Disposition: A | Discharge status: Home / Self Care | Payer: 59 | Source: Ambulatory Visit | Attending: Hematology & Oncology | Admitting: Hematology & Oncology

## 2016-05-12 ENCOUNTER — Encounter (HOSPITAL_COMMUNITY): Payer: Self-pay

## 2016-05-12 DIAGNOSIS — C61 Malignant neoplasm of prostate: Secondary | ICD-10-CM | POA: Diagnosis present

## 2016-05-12 DIAGNOSIS — C7951 Secondary malignant neoplasm of bone: Secondary | ICD-10-CM | POA: Diagnosis present

## 2016-05-12 MED ORDER — TECHNETIUM TC 99M MEDRONATE IV KIT
25.0000 | PACK | Freq: Once | INTRAVENOUS | Status: AC | PRN
  Administered 2016-05-12: 19.9 via INTRAVENOUS

## 2016-05-14 ENCOUNTER — Ambulatory Visit (HOSPITAL_COMMUNITY)
Admission: RE | Admit: 2016-05-14 | Discharge: 2016-05-14 | Disposition: A | Discharge status: Home / Self Care | Payer: 59 | Source: Ambulatory Visit | Attending: Hematology & Oncology | Admitting: Hematology & Oncology

## 2016-05-14 DIAGNOSIS — I700 Atherosclerosis of aorta: Secondary | ICD-10-CM | POA: Insufficient documentation

## 2016-05-14 DIAGNOSIS — J432 Centrilobular emphysema: Secondary | ICD-10-CM | POA: Insufficient documentation

## 2016-05-14 DIAGNOSIS — C61 Malignant neoplasm of prostate: Secondary | ICD-10-CM | POA: Diagnosis present

## 2016-05-14 DIAGNOSIS — I714 Abdominal aortic aneurysm, without rupture: Secondary | ICD-10-CM | POA: Diagnosis not present

## 2016-05-14 DIAGNOSIS — R911 Solitary pulmonary nodule: Secondary | ICD-10-CM | POA: Diagnosis not present

## 2016-05-14 DIAGNOSIS — N281 Cyst of kidney, acquired: Secondary | ICD-10-CM | POA: Insufficient documentation

## 2016-05-14 DIAGNOSIS — C7951 Secondary malignant neoplasm of bone: Secondary | ICD-10-CM | POA: Diagnosis present

## 2016-05-14 DIAGNOSIS — M47814 Spondylosis without myelopathy or radiculopathy, thoracic region: Secondary | ICD-10-CM | POA: Insufficient documentation

## 2016-05-14 MED ORDER — IOPAMIDOL (ISOVUE-300) INJECTION 61%
100.0000 mL | Freq: Once | INTRAVENOUS | Status: AC | PRN
  Administered 2016-05-14: 100 mL via INTRAVENOUS

## 2016-05-16 ENCOUNTER — Ambulatory Visit (HOSPITAL_COMMUNITY): Payer: Self-pay | Admitting: Hematology & Oncology

## 2016-05-16 ENCOUNTER — Other Ambulatory Visit (HOSPITAL_COMMUNITY): Payer: Self-pay

## 2016-05-21 ENCOUNTER — Other Ambulatory Visit (HOSPITAL_COMMUNITY): Payer: Self-pay | Admitting: Oncology

## 2016-05-21 DIAGNOSIS — C7951 Secondary malignant neoplasm of bone: Secondary | ICD-10-CM

## 2016-05-21 DIAGNOSIS — C61 Malignant neoplasm of prostate: Secondary | ICD-10-CM

## 2016-05-21 MED ORDER — HYDROCODONE-ACETAMINOPHEN 10-325 MG PO TABS: 1.0000 | ORAL_TABLET | ORAL | 0 refills | Status: DC | PRN

## 2016-05-26 ENCOUNTER — Encounter (HOSPITAL_COMMUNITY): Payer: Self-pay | Admitting: Dentistry

## 2016-05-26 ENCOUNTER — Ambulatory Visit (HOSPITAL_COMMUNITY): Payer: Medicaid - Dental | Admitting: Dentistry

## 2016-05-26 VITALS — BP 116/79 | HR 77 | Temp 98.0°F

## 2016-05-26 DIAGNOSIS — R29898 Other symptoms and signs involving the musculoskeletal system: Secondary | ICD-10-CM

## 2016-05-26 DIAGNOSIS — K0820 Unspecified atrophy of edentulous alveolar ridge: Secondary | ICD-10-CM

## 2016-05-26 DIAGNOSIS — C7951 Secondary malignant neoplasm of bone: Secondary | ICD-10-CM

## 2016-05-26 DIAGNOSIS — T148XXD Other injury of unspecified body region, subsequent encounter: Secondary | ICD-10-CM

## 2016-05-26 DIAGNOSIS — K08109 Complete loss of teeth, unspecified cause, unspecified class: Secondary | ICD-10-CM

## 2016-05-26 DIAGNOSIS — C61 Malignant neoplasm of prostate: Secondary | ICD-10-CM

## 2016-05-26 NOTE — Progress Notes (Signed)
PROGRESS NOTE:  05/26/2016 Matthew [name] [id number]  VITALS: BP 116/79 (BP Location: Left Arm)   Pulse 77   Temp 98 F (36.7 C) (Oral)   LABS:  Lab Results  Component Value Date   WBC 11.9 (H) 04/21/2016   HGB 11.9 (L) 04/21/2016   HCT 35.5 (L) 04/21/2016   MCV 92.2 04/21/2016   PLT 231 04/21/2016   BMET    Component Value Date/Time   NA 135 04/21/2016 1032   K 4.4 04/21/2016 1032   CL 105 04/21/2016 1032   CO2 22 04/21/2016 1032   GLUCOSE 184 (H) 04/21/2016 1032   BUN 13 04/21/2016 1032   CREATININE 0.75 04/21/2016 1032   CALCIUM 8.9 04/21/2016 1032   GFRNONAA >60 04/21/2016 1032   GFRAA >60 04/21/2016 1032    Lab Results  Component Value Date   INR 0.96 01/01/2016   INR 1.04 12/24/2015   No results found for: PTT   Matthew [name] is status post extraction of remaining teeth with alveoloplasty and pre-prosthetic surgery as needed in the operating room with general anesthesia on 03/06/2016. Patient was seen on 03/17/16 for evaluation of healing and suture removal.  Delayed healing was noted on the mandibular right lingual alveolar ridge area 30-32. Patient was then scheduled for follow-up evaluation in 1 month. Patient was seen on 04/15/2016 with persistent delayed healing and exposed bone area #30-32 measuring 2 mm x 8 mm. The patient now presents for reevaluation of healing of the lower right quadrant.  SUBJECTIVE: Patient with minimal complaints. Patient indicates the bone fell out about a week ago. Patient denies having any problems with this area. Patient denies having any tongue irritation. Patient completed 6 cycles of chemotherapy.  Patient will follow-up examination with Dr. Whitney Muse for 06/02/16 for discussion of since cans and additional need for chemotherapy or Xgeva therapy. Patient continues to use a soft diet at this time.  EXAM: The patient is edentulous. There is atrophy of the edentulous alveolar ridges. The area of exposed bone measuring of the lower  right quadrant has healed in. Bone sequestrum is gone. Soft tissue is covering all extraction sites.  ASSESSMENT: The patient is now edentulous. There is atrophy of the edentulous alveolar ridges. The previous area of exposed mandible in the area of #30-32 has resolved.   PLAN: 1. The patient will continue to use chlorhexidine rinses 3 times daily after breakfast, lunch, and at bedtime.  2. Continue soft diet at this time. 3. Patient is currently cleared for [facility] therapy with Dr. Whitney Muse as needed.  4. We discussed fabrication of upper lower complete dentures with a dentist of his choice. A quote was provided for the denture fabrication. Patient to call if he desires to proceed with denture fabrication with Dental Medicine. 5. Patient will call if problems arise before then.   Lenn Cal, DDS

## 2016-05-26 NOTE — Patient Instructions (Signed)
PLAN: 1. The patient will continue to use chlorhexidine rinses 3 times daily after breakfast, lunch, and at bedtime.  2. Continue soft diet at this time. 3. Patient is currently cleared for [facility] therapy with Dr. Whitney Muse as needed.  4. We discussed fabrication of upper lower complete dentures with a dentist of his choice. A quote was provided for the denture fabrication. Patient to call if he desires to proceed with denture fabrication with Dental Medicine. 5. Patient will call if problems arise before then.   Lenn Cal, DDS

## 2016-06-02 ENCOUNTER — Encounter (HOSPITAL_COMMUNITY): Payer: 59 | Attending: Hematology & Oncology | Admitting: Hematology & Oncology

## 2016-06-02 ENCOUNTER — Encounter (HOSPITAL_COMMUNITY): Payer: Self-pay | Admitting: Hematology & Oncology

## 2016-06-02 ENCOUNTER — Encounter (HOSPITAL_COMMUNITY): Payer: 59

## 2016-06-02 VITALS — BP 122/78 | HR 76 | Temp 98.6°F | Resp 18 | Wt 243.0 lb

## 2016-06-02 DIAGNOSIS — R634 Abnormal weight loss: Secondary | ICD-10-CM | POA: Diagnosis not present

## 2016-06-02 DIAGNOSIS — M1990 Unspecified osteoarthritis, unspecified site: Secondary | ICD-10-CM | POA: Diagnosis not present

## 2016-06-02 DIAGNOSIS — Z79899 Other long term (current) drug therapy: Secondary | ICD-10-CM | POA: Insufficient documentation

## 2016-06-02 DIAGNOSIS — Z7982 Long term (current) use of aspirin: Secondary | ICD-10-CM | POA: Insufficient documentation

## 2016-06-02 DIAGNOSIS — C61 Malignant neoplasm of prostate: Secondary | ICD-10-CM

## 2016-06-02 DIAGNOSIS — F17210 Nicotine dependence, cigarettes, uncomplicated: Secondary | ICD-10-CM | POA: Insufficient documentation

## 2016-06-02 DIAGNOSIS — R3911 Hesitancy of micturition: Secondary | ICD-10-CM | POA: Diagnosis not present

## 2016-06-02 DIAGNOSIS — R972 Elevated prostate specific antigen [PSA]: Secondary | ICD-10-CM | POA: Insufficient documentation

## 2016-06-02 DIAGNOSIS — I82409 Acute embolism and thrombosis of unspecified deep veins of unspecified lower extremity: Secondary | ICD-10-CM | POA: Insufficient documentation

## 2016-06-02 DIAGNOSIS — K5900 Constipation, unspecified: Secondary | ICD-10-CM

## 2016-06-02 DIAGNOSIS — G893 Neoplasm related pain (acute) (chronic): Secondary | ICD-10-CM | POA: Diagnosis not present

## 2016-06-02 DIAGNOSIS — F4321 Adjustment disorder with depressed mood: Secondary | ICD-10-CM

## 2016-06-02 DIAGNOSIS — C7951 Secondary malignant neoplasm of bone: Secondary | ICD-10-CM

## 2016-06-02 DIAGNOSIS — R110 Nausea: Secondary | ICD-10-CM | POA: Diagnosis not present

## 2016-06-02 DIAGNOSIS — Z720 Tobacco use: Secondary | ICD-10-CM

## 2016-06-02 DIAGNOSIS — M2550 Pain in unspecified joint: Secondary | ICD-10-CM

## 2016-06-02 LAB — COMPREHENSIVE METABOLIC PANEL
ALK PHOS: 102 U/L (ref 38–126)
ALT: 15 U/L — ABNORMAL LOW (ref 17–63)
ANION GAP: 6 (ref 5–15)
AST: 18 U/L (ref 15–41)
Albumin: 3.9 g/dL (ref 3.5–5.0)
BUN: 14 mg/dL (ref 6–20)
CALCIUM: 8.9 mg/dL (ref 8.9–10.3)
CHLORIDE: 105 mmol/L (ref 101–111)
CO2: 28 mmol/L (ref 22–32)
Creatinine, Ser: 0.99 mg/dL (ref 0.61–1.24)
GFR calc non Af Amer: >60 mL/min (ref >60)
Glucose, Bld: 96 mg/dL (ref 65–99)
Potassium: 4.1 mmol/L (ref 3.5–5.1)
SODIUM: 139 mmol/L (ref 135–145)
Total Bilirubin: 0.5 mg/dL (ref 0.3–1.2)
Total Protein: 6.8 g/dL (ref 6.5–8.1)

## 2016-06-02 LAB — CBC WITH DIFFERENTIAL/PLATELET
Basophils Absolute: 0 10*3/uL (ref 0.0–0.1)
Basophils Relative: 0 %
EOS PCT: 6 %
Eosinophils Absolute: 0.4 10*3/uL (ref 0.0–0.7)
HCT: 44.1 % (ref 39.0–52.0)
Hemoglobin: 14.5 g/dL (ref 13.0–17.0)
LYMPHS ABS: 2.1 10*3/uL (ref 0.7–4.0)
Lymphocytes Relative: 33 %
MCH: 30.7 pg (ref 26.0–34.0)
MCHC: 32.9 g/dL (ref 30.0–36.0)
MCV: 93.4 fL (ref 78.0–100.0)
MONOS PCT: 9 %
Monocytes Absolute: 0.6 10*3/uL (ref 0.1–1.0)
Neutro Abs: 3.4 10*3/uL (ref 1.7–7.7)
Neutrophils Relative %: 52 %
PLATELETS: 193 10*3/uL (ref 150–400)
RBC: 4.72 MIL/uL (ref 4.22–5.81)
RDW: 13 % (ref 11.5–15.5)
WBC: 6.4 10*3/uL (ref 4.0–10.5)

## 2016-06-02 LAB — PSA: PSA: 0.99 ng/mL (ref 0.00–4.00)

## 2016-06-02 MED ORDER — PREDNISONE 5 MG PO TABS: 5.0000 mg | ORAL_TABLET | Freq: Every day | ORAL | 1 refills | Status: DC

## 2016-06-02 MED ORDER — HYDROCODONE-ACETAMINOPHEN 10-325 MG PO TABS: 1.0000 | ORAL_TABLET | ORAL | 0 refills | Status: DC | PRN

## 2016-06-02 MED ORDER — FENTANYL 50 MCG/HR TD PT72: 50.0000 ug | MEDICATED_PATCH | TRANSDERMAL | 0 refills | Status: DC

## 2016-06-02 MED ORDER — FENTANYL 25 MCG/HR TD PT72: 25.0000 ug | MEDICATED_PATCH | TRANSDERMAL | 0 refills | Status: DC

## 2016-06-02 NOTE — Patient Instructions (Addendum)
[place] at [facility] Discharge Instructions  RECOMMENDATIONS MADE BY THE CONSULTANT AND ANY TEST RESULTS WILL BE SENT TO YOUR REFERRING PHYSICIAN.  You saw Dr.Penland today. You are being referred to Cardiology because of Chest Pain. Labs today. Follow up in 4 weeks. Start Xgeva monthly. See Amy at checkout for appointments.  Thank you for choosing [place] at [facility] to provide your oncology and hematology care.  To afford each patient quality time with our provider, please arrive at least 15 minutes before your scheduled appointment time.   Beginning January 23rd 2017 lab work for the [facility] will be done in the  Main lab at [facility] on 1st floor. If you have a lab appointment with the [place] please come in thru the  Main Entrance and check in at the main information desk  You need to re-schedule your appointment should you arrive 10 or more minutes late.  We strive to give you quality time with our providers, and arriving late affects you and other patients whose appointments are after yours.  Also, if you no show three or more times for appointments you may be dismissed from the clinic at the providers discretion.     Again, thank you for choosing [facility].  Our hope is that these requests will decrease the amount of time that you wait before being seen by our physicians.       _____________________________________________________________  Should you have questions after your visit to [facility], please contact our office at [phone] between the hours of 8:30 a.m. and 4:30 p.m.  Voicemails left after 4:30 p.m. will not be returned until the following business day.  For prescription refill requests, have your pharmacy contact our office.         Resources For Cancer Patients and their Caregivers ? American Cancer Society: Can assist with transportation, wigs, general needs,  runs Look Good Feel Better.        ([phone] ? Cancer Care: Provides financial assistance, online support groups, medication/co-pay assistance.  1-800-813-HOPE 364-731-6947) ? [place] Assists [place] cancer patients and their families through emotional , educational and financial support.  6295697170 ? [place] DSS Where to apply for food stamps, Medicaid and utility assistance. 661-429-1423 ? [employer]ATS: Transportation to medical appointments. 606-142-1041 ? Social Security Administration: May apply for disability if have a Stage IV cancer. 204-229-1053 254-341-0638 ? [employer], Disability and Transit Services: Assists with nutrition, care and transit needs. [place] Support Programs: @10RELATIVEDAYS @ > Cancer Support Group  2nd Tuesday of the month 1pm-2pm, Journey Room  > Creative Journey  3rd Tuesday of the month 1130am-1pm, Journey Room  > Look Good Feel Better  1st Wednesday of the month 10am-12 noon, Journey Room (Call [place] to register [phone])

## 2016-06-02 NOTE — Progress Notes (Signed)
[place]  PROGRESS NOTE  Patient Care Team: [name], MD as PCP - General (Family Medicine)  CHIEF COMPLAINTS:  CT angiography chest w/contrast 12/10/2015 multiple thoracic and rib osseous lesions suggesting osseous metastatic disease. No obvious primary PSA 12/12/2015 153.85    Prostate carcinoma ([place])   12/10/2015 Imaging    CTA chest, multiple thoracic and rib osseous lesions. no primary evident. Atherosclerosis including aortic and CAD      12/12/2015 Imaging    CT abdomen/pelvis with sclerotic osseous lesions througout the lumbar spine, sacrum, bilateral iliac bones and L proximal femur worrisome for sclerotic osseous mets,mild prostatomegaly, no LAD, Infrarenal 3.8 cm AAA      12/12/2015 Tumor Marker    PSA 153.85      12/18/2015 Imaging    Bone Scan Diffuse bony metastatic disease, posterior calvarium, sternum, thoracic, lumbar spine, bilateral ribs, L shoulder, bilateral bony pelvis, proximal L femur      12/24/2015 Initial Biopsy    CT biopsy of L iliac bone lesion      12/26/2015 Pathology Results    Bone, biopsy, left iliac - POSITIVE FOR METASTATIC ADENOCARCINOMA.      12/28/2015 -  Chemotherapy    Firmagon instituted 240 mg       01/01/2016 Procedure    Port placed by IR.      01/07/2016 -  Chemotherapy    Docetaxel every 21 days      02/18/2016 Adverse Reaction    GI toxicity from docetaxel, dose reduced to 60 mg/m2      02/18/2016 Treatment Plan Change    Docetaxel dose reduced by 20%      03/06/2016 Procedure    Dr. Enrique Sack- Multiple extraction of tooth numbers 3, 5, 6, 8, 9, 10, 11, 12, 20, 21, 22, 27, 28, and 31. 4 Quadrants of alveoloplasty. Bilateral mandibular lingual tori reductions.      04/09/2016 Imaging    Brain MRI 1. No intracranial metastatic disease 2. No acute intracranial abnormality 3. Findings of chronic microvascular disease      05/14/2016 Imaging    CT C/A/P Stable appearing diffuse bony metastatic disease.  No new significant disease is seen Innumerable patchy sclerotic osseous metastases throughout the axial and proximal appendicular skeleton, stable in size and distribution, significantly increased in density in the interval, likely reflecting treatment effect. 2. No new or progressive metastatic disease in the chest, abdomen or pelvis. 3. Bilateral perifissural pulmonary nodules are stable and probably benign. 4. Aortic atherosclerosis. Infrarenal 4.3 cm abdominal aortic aneurysm, minimally increased in size. Recommend followup by ultrasound in 1 year.      06/02/2016 Tumor Marker    PSA 0.99 ng/ml        HISTORY OF PRESENTING ILLNESS:  Matthew [name] 60 y.o. male is here for further follow-up of stage IV prostate cancer. He is on firmagon (which he notes drastically improved his pain). He completed 6 cycles of taxotere/prednisone. He could not tolerate full dose taxotere secondary to GI side effects.   [name] is accompanied by his daughter. I personally reviewed and went over imaging studies with the patient.   His chest still hurts a bit. His hip still hurts as well. He also notes swelling in his left knee.  He reports shortness of breath when his "chest gets to hurting".  It is hard to differentiate if his CP is from sternal metastases vs. Cardiac, he is not specific. He continues to smoke heavily.   He is  nauseated on a daily basis per his daughter and they are not sure why. He is currently taking a PPI twice daily. He continues to eat well. Patient states the nausea comes and goes.  He reports poor energy level. He is unable to weed eat or split wood. He walks as much as he can. He can hardly pick up his grandson.   He has been constipated and only has a bowel movement about once weekly.  He smokes about 1 ppd.   His daughter believes he may benefit from an antidepressant.   MEDICAL HISTORY:  Past Medical History:  Diagnosis Date  . Arthritis    Left knee  . Bone  metastases ([place]) 12/26/2015  . Cancer [facility])    Prostate  . DVT (deep venous thrombosis) ([place]) 2004  . Kidney stones    ~2002  . Prostate carcinoma ([place]) 12/26/2015    SURGICAL HISTORY: Past Surgical History:  Procedure Laterality Date  . CIRCUMCISION  30 years ago  . MULTIPLE EXTRACTIONS WITH ALVEOLOPLASTY N/A 03/06/2016   Procedure: Extraction of tooth #'s 3,5,6,8-12,20-22, 27, 28, and 31 with alveoloplasty and bilateral mandibular tori reductions;  Surgeon: Lenn Cal, DDS;  Location: [facility];  Service: Oral Surgery;  Laterality: N/A;    SOCIAL HISTORY: Social History   Social History  . Marital status: Single    Spouse name: N/A  . Number of children: 2  . Years of education: N/A   Occupational History  . Construction/carpenter    Social History Main Topics  . Smoking status: Current Every Day Smoker    Packs/day: 1.00    Years: 50.00    Types: Cigarettes    Start date: 06/16/1965  . Smokeless tobacco: Former Systems developer    Types: Chew    Quit date: 08/04/2005  . Alcohol use No  . Drug use:     Types: Marijuana     Comment: THC for appetite  . Sexual activity: Not on file   Other Topics Concern  . Not on file   Social History Narrative  . No narrative on file  Divorced for 57 years 2 girls 4 grandchildren Smokes pack a day; Started at 60 years old Beer every once in a while  Hobbies-plays pool Works in Architect  FAMILY HISTORY: Family History  Problem Relation Age of Onset  . Cancer Mother     Cory Roughen (adopted him) Mother died in 39s from bone cancer Never met father Brothers and sisters all died from cancer 3 brother 3 sisters  ALLERGIES:  is allergic to meloxicam.  MEDICATIONS:  Current Outpatient Prescriptions  Medication Sig Dispense Refill  . Calcium Carb-Cholecalciferol (CALCIUM 500 + D3) 500-600 MG-UNIT TABS Take 2 tablets by mouth daily. 60 tablet 6  . calcium carbonate (OS-CAL) 1250 (500 Ca) MG chewable tablet Chew 1 tablet by mouth  daily as needed for heartburn.    . chlorhexidine (PERIDEX) 0.12 % solution Rinse with 15 mls three times daily for 30 seconds. Use after breakfast, lunch, and at bedtime. Spit out excess. Do not swallow. 960 mL prn  . Degarelix Acetate (FIRMAGON [place]) Inject into the skin every 28 (twenty-eight) days. Reported on 12/28/2015    . ergocalciferol (VITAMIN D2) 50000 units capsule Take 1 capsule (50,000 Units total) by mouth every Friday. 30 capsule 2  . fentaNYL (DURAGESIC - DOSED MCG/HR) 25 MCG/HR patch Place 1 patch (25 mcg total) onto the skin every 3 (three) days. Add to 50 mcg patch for total of 75 mcg  10 patch 0  . fentaNYL (DURAGESIC - DOSED MCG/HR) 50 MCG/HR Place 1 patch (50 mcg total) onto the skin every 3 (three) days. 10 patch 0  . HYDROcodone-acetaminophen (NORCO) 10-325 MG tablet Take 1 tablet by mouth every 4 (four) hours as needed. 60 tablet 0  . lidocaine-prilocaine (EMLA) cream Apply a quarter size amount to port site 1 hour prior to chemo. Do not rub in. Cover with plastic wrap. 30 g 3  . omeprazole (PRILOSEC) 40 MG capsule Take 1 capsule (40 mg total) by mouth 2 (two) times daily. 60 capsule 3  . ondansetron (ZOFRAN) 8 MG tablet Take 1 tablet (8 mg total) by mouth every 8 (eight) hours as needed for nausea or vomiting. 30 tablet 2  . prochlorperazine (COMPAZINE) 10 MG tablet Take 1 tablet (10 mg total) by mouth every 6 (six) hours as needed for nausea or vomiting. 30 tablet 2  . predniSONE (DELTASONE) 5 MG tablet Take 1 tablet (5 mg total) by mouth daily with breakfast. 60 tablet 1   No current facility-administered medications for this visit.     Review of Systems  Constitutional: Positive for malaise/fatigue. Negative for weight loss.  Eyes: Negative.   Respiratory: Positive for shortness of breath.        SOB with chest pain  Cardiovascular: Positive for chest pain.  Gastrointestinal: Positive for constipation, heartburn and nausea. Negative for abdominal pain, diarrhea and  vomiting.  Musculoskeletal: Positive for joint pain.       Hip pain and left knee pain  Skin: Negative.   Endo/Heme/Allergies: Negative.   Psychiatric/Behavioral: Negative.   All other systems reviewed and are negative. 14 point ROS was done and is otherwise as detailed above or in HPI   PHYSICAL EXAMINATION: ECOG PERFORMANCE STATUS: 1 - Symptomatic but completely ambulatory  Vitals with BMI 06/02/2016  Height   Weight 243 lbs  BMI   Systolic 468  Diastolic 78  Pulse 76  Respirations 18    Physical Exam  Constitutional: He is oriented to person, place, and time and well-developed, well-nourished, and in no distress.  HENT:  Head: Normocephalic and atraumatic.  Nose: Nose normal.  Mouth/Throat: Oropharynx is clear and moist. No oropharyngeal exudate.  Edentulous  Eyes: Conjunctivae and EOM are normal. Pupils are equal, round, and reactive to light. Right eye exhibits no discharge. Left eye exhibits no discharge. No scleral icterus.  Neck: Normal range of motion. Neck supple. No tracheal deviation present. No thyromegaly present.  Cardiovascular: Normal rate, regular rhythm and normal heart sounds.  Exam reveals no gallop and no friction rub.   No murmur heard. Pulmonary/Chest: Effort normal. He has no wheezes. He has no rales.  Decreased throughout but clear  Abdominal: Soft. Bowel sounds are normal. He exhibits no distension and no mass. There is no tenderness. There is no rebound and no guarding.  Musculoskeletal: Normal range of motion. He exhibits no edema.  Left knee effusion  Lymphadenopathy:    He has no cervical adenopathy.  Neurological: He is alert and oriented to person, place, and time. He has normal reflexes. No cranial nerve deficit. Gait normal. Coordination normal.  Skin: Skin is warm and dry. No rash noted.  Psychiatric: Mood, memory, affect and judgment normal.  Nursing note and vitals reviewed.  LABORATORY DATA:  I have reviewed the data as  listed  CBC    Component Value Date/Time   WBC 6.4 06/02/2016 0931   RBC 4.72 06/02/2016 0931   HGB 14.5 06/02/2016  0931   HCT 44.1 06/02/2016 0931   PLT 193 06/02/2016 0931   MCV 93.4 06/02/2016 0931   MCH 30.7 06/02/2016 0931   MCHC 32.9 06/02/2016 0931   RDW 13.0 06/02/2016 0931   LYMPHSABS 2.1 06/02/2016 0931   MONOABS 0.6 06/02/2016 0931   EOSABS 0.4 06/02/2016 0931   BASOSABS 0.0 06/02/2016 0931   CMP     Component Value Date/Time   NA 139 06/02/2016 0931   K 4.1 06/02/2016 0931   CL 105 06/02/2016 0931   CO2 28 06/02/2016 0931   GLUCOSE 96 06/02/2016 0931   BUN 14 06/02/2016 0931   CREATININE 0.99 06/02/2016 0931   CALCIUM 8.9 06/02/2016 0931   PROT 6.8 06/02/2016 0931   ALBUMIN 3.9 06/02/2016 0931   AST 18 06/02/2016 0931   ALT 15 (L) 06/02/2016 0931   ALKPHOS 102 06/02/2016 0931   BILITOT 0.5 06/02/2016 0931   GFRNONAA >60 06/02/2016 0931   GFRAA >60 06/02/2016 0931   Results for SHANE, BADEAUX (MRN [id number])   Ref. Range 02/18/2016 09:59 03/10/2016 10:17 03/31/2016 10:10 04/21/2016 10:32 06/02/2016 09:31  PSA Latest Ref Range: 0.00 - 4.00 ng/mL 12.57 (H) 6.17 (H) 3.12 2.10 0.99     RADIOGRAPHIC STUDIES: I have personally reviewed the radiological images as listed and agreed with the findings in the report. Study Result   CLINICAL DATA:  Stage IV prostate cancer diagnosed May 2017 presenting for restaging with ongoing chemotherapy.  EXAM: CT CHEST, ABDOMEN, AND PELVIS WITH CONTRAST  TECHNIQUE: Multidetector CT imaging of the chest, abdomen and pelvis was performed following the standard protocol during bolus administration of intravenous contrast.  CONTRAST:  118m ISOVUE-300 IOPAMIDOL (ISOVUE-300) INJECTION 61%  COMPARISON:  12/10/2015 chest CT angiogram. 12/12/2015 CT abdomen/ pelvis.  FINDINGS: CT CHEST FINDINGS  Cardiovascular: Normal heart size. No significant pericardial fluid/thickening. Right internal jugular MediPort terminates  at the cavoatrial junction. Great vessels are normal in course and caliber. No central pulmonary emboli.  Mediastinum/Nodes: No discrete thyroid nodules. Unremarkable esophagus. No pathologically enlarged axillary, mediastinal or hilar lymph nodes.  Lungs/Pleura: No pneumothorax. No pleural effusion. Subpleural anterior right lower lobe 6 mm pulmonary nodule associated with the right major fissure (series 3/ image 97) is stable since 12/10/2015. Subpleural anterior left lower lobe 5 mm pulmonary nodule associated with the left major fissure (series 3/image 80) is stable. No acute consolidative airspace disease, lung masses or new significant pulmonary nodules. Mild centrilobular emphysema with mild diffuse bronchial wall thickening.  Musculoskeletal: Innumerable patchy sclerotic osseous metastases throughout the entire thoracic skeleton including the bilateral shoulder girdles, sternum, bilateral ribs and thoracic spine are significantly increased in density since 12/10/2015 and appear stable in size and distribution. Moderate thoracic spondylosis.  CT ABDOMEN PELVIS FINDINGS  Hepatobiliary: Normal liver with no liver mass. Normal gallbladder with no radiopaque cholelithiasis. No biliary ductal dilatation.  Pancreas: Normal, with no mass or duct dilation.  Spleen: Normal size. No mass.  Adrenals/Urinary Tract: Normal adrenals. No hydronephrosis. Simple renal cysts in the lower right kidney measuring up to 6.2 cm. Numerous subcentimeter hypodense renal cortical lesions in both kidneys are too small to characterize and are not appreciably changed, most consistent with benign renal cysts. Relatively collapsed and grossly normal bladder.  Stomach/Bowel: Grossly normal stomach. Normal caliber small bowel with no small bowel wall thickening. Normal appendix. Normal large bowel with no diverticulosis, large bowel wall thickening or pericolonic fat  stranding.  Vascular/Lymphatic: Abdominal aortic atherosclerosis. Infrarenal abdominal aortic aneurysm measures 4.3 cm maximum  diameter, previously 4.2 cm using similar measurement technique, minimally increased. Patent portal, splenic, hepatic and renal veins. No pathologically enlarged lymph nodes in the abdomen or pelvis.  Reproductive: Top-normal size prostate is decreased in size (transverse diameter 4.0 cm, previously 4.9 cm). Nonspecific internal prostatic calcifications.  Other: No pneumoperitoneum, ascites or focal fluid collection.  Musculoskeletal: Innumerable scattered and confluent sclerotic osseous metastases throughout the abdominopelvic skeleton, including the lumbar spine, sacrum, bilateral iliac bones and left proximal femur, are significantly increased in density since 12/12/2015, and appear stable in size and distribution. Mild lumbar spondylosis.  IMPRESSION: 1. Innumerable patchy sclerotic osseous metastases throughout the axial and proximal appendicular skeleton, stable in size and distribution, significantly increased in density in the interval, likely reflecting treatment effect. 2. No new or progressive metastatic disease in the chest, abdomen or pelvis. 3. Bilateral perifissural pulmonary nodules are stable and probably benign. 4. Aortic atherosclerosis. Infrarenal 4.3 cm abdominal aortic aneurysm, minimally increased in size. Recommend followup by ultrasound in 1 year. This recommendation follows ACR consensus guidelines: White Paper of the ACR Incidental Findings Committee II on Vascular Findings. J Am Coll Radiol 2013; 10:789-794. 5. Mild centrilobular emphysema with mild diffuse bronchial wall thickening, suggesting COPD.   Electronically Signed   By: Ilona Sorrel M.D.   On: 05/14/2016 15:37   Study Result   CLINICAL DATA:  History of prostate carcinoma with increasing neck and back pain common no known injury, initial  encounter  EXAM: NUCLEAR MEDICINE WHOLE BODY BONE SCAN  TECHNIQUE: Whole body anterior and posterior images were obtained approximately 3 hours after intravenous injection of radiopharmaceutical.  RADIOPHARMACEUTICALS:  19.9 mCi Technetium-75mMDP IV  COMPARISON:  12/18/2015  FINDINGS: There again noted is areas of increased activity in the posterior calvarium, Rib cage, thoracic spine, lumbar spine, proximal left femur, sacrum, left shoulder and pelvic bones similar to that noted on the prior exam. No new focal area of increased activity is identified to correspond progressive metastatic disease.  Degenerative changes are again seen within the knee joints predominately on the left as well as about the shoulder girdles bilaterally.  IMPRESSION: Stable appearing diffuse bony metastatic disease. No new significant disease is seen.   Electronically Signed   By: MInez CatalinaM.D.   On: 05/12/2016 15:46     PATHOLOGY   ASSESSMENT & PLAN:  Stage IV adenocarcinoma of prostate, hormone sensitive disease Bony Metastatic Disease Abnormal CT imaging Elevated PSA Urinary hesitancy, frequency Tobacco Abuse Cancer related Pain Weight loss GERD Situational Depression headache  Imaging studies were reviewed. Results are noted above. No new disease, post treatment effect noted. Aneurysm noted, this will continue to be followed.  PSA level reviewed. Results noted above.   He has been cleared by Dr.Kalinski for Xgeva. He will begin taking Xgeva. He will continue taking [place] be transitioned to [name].   He complains of joint pain and nausea. The patient will restart prednisone and then taper off. If his nausea persists we will refer him to GI for an EGD.  I have referred the patient to cardiology for ongoing CP complaints. I suspect it is related to his disease but he is a heavy smoker.  After he has been checked out, the patient will be referred to physical  therapy.  Smoking cessation was addressed again.   He will return for follow up in 1 month. Medications refilled as below:  Meds ordered this encounter  Medications  . HYDROcodone-acetaminophen (NORCO) 10-325 MG tablet    Sig:  Take 1 tablet by mouth every 4 (four) hours as needed.    Dispense:  60 tablet    Refill:  0  . predniSONE (DELTASONE) 5 MG tablet    Sig: Take 1 tablet (5 mg total) by mouth daily with breakfast.    Dispense:  60 tablet    Refill:  1  . fentaNYL (DURAGESIC - DOSED MCG/HR) 50 MCG/HR    Sig: Place 1 patch (50 mcg total) onto the skin every 3 (three) days.    Dispense:  10 patch    Refill:  0  . fentaNYL (DURAGESIC - DOSED MCG/HR) 25 MCG/HR patch    Sig: Place 1 patch (25 mcg total) onto the skin every 3 (three) days. Add to 50 mcg patch for total of 75 mcg    Dispense:  10 patch    Refill:  0   All questions were answered. The patient knows to call the clinic with any problems, questions or concerns.  This document serves as a record of services personally performed by Ancil Linsey, MD. It was created on her behalf by Arlyce Harman, a trained medical scribe. The creation of this record is based on the scribe's personal observations and the provider's statements to them. This document has been checked and approved by the attending provider.  I have reviewed the above documentation for accuracy and completeness and I agree with the above.  This note was electronically signed.    Molli Hazard, MD  06/28/2016 3:02 PM

## 2016-06-02 NOTE — Assessment & Plan Note (Signed)
Metastatic prostate adenocarcinoma, to bone.  Began Real on 12/28/2015 and systemic chemotherapy with Docetaxel on 01/07/2016 and Prednisone 5 mg BID.  Oncology history is up to date.  Firmagon monthly moving forward per protocol.  Will transition to Depo-[name] in the future.  Oncology Flowsheet 02/22/2016  degarelix (FIRMAGON) [place] 80 mg    XGEVA ON HOLD for 8-12 weeks after undergoing extractions by Dr. Enrique Sack on 03/06/2016.  Current pain regimen: Fentanyl 50 mcg and PO Hydrocodone 2/day.  He requests a refill on his Fentanyl which will be done today.  He reports 40-50 Hydrocodone pills left in his last Rx.  Pre-treatment labs today as ordered: CBC diff, CMET, PSA.  I personally reviewed and went over laboratory results with the patient.  The results are noted within this dictation.  Labs today meet chemotherapy treatment parameters.  Therefore, chemotherapy will be administered today as planned.  Left lower extremity "bump" is suspicious for a varicosity subcutaneous bleed.  No intervention needed at this time other than supportive care.  Return in 3 weeks for follow-up and next cycle of chemotherapy.

## 2016-06-03 ENCOUNTER — Other Ambulatory Visit (HOSPITAL_COMMUNITY): Payer: Self-pay | Admitting: Oncology

## 2016-06-03 LAB — TESTOSTERONE, FREE: Testosterone, Free: 1.2 pg/mL — ABNORMAL LOW (ref 6.6–18.1)

## 2016-06-03 LAB — TESTOSTERONE: Testosterone: 16 ng/dL — ABNORMAL LOW (ref 264–916)

## 2016-06-16 ENCOUNTER — Encounter: Payer: Self-pay | Admitting: Internal Medicine

## 2016-06-16 ENCOUNTER — Ambulatory Visit (INDEPENDENT_AMBULATORY_CARE_PROVIDER_SITE_OTHER): Payer: 59 | Admitting: Internal Medicine

## 2016-06-16 VITALS — BP 110/72 | HR 83 | Ht 73.0 in | Wt 238.0 lb

## 2016-06-16 DIAGNOSIS — R072 Precordial pain: Secondary | ICD-10-CM | POA: Diagnosis not present

## 2016-06-16 DIAGNOSIS — I713 Abdominal aortic aneurysm, ruptured, unspecified: Secondary | ICD-10-CM

## 2016-06-16 NOTE — Progress Notes (Signed)
Cardiology Office Note   Date:  06/16/2016   ID:  Matthew [name], DOB 11-05-1955, MRN [id number]  PCP:  Purvis Kilts, MD  Cardiologist:   Dorris Carnes, MD   Pt referred for eval of CP      History of Present Illness: Matthew [name] is a 60 y.o. male with a history of  CP  Referrred by Dr Whitney Muse  Pt with a history of prostate Ca  He presented to ER last spring with severe CP  More R sided  Found to have metastatic prostated CA    Rx with [name] and Docetaxel   He has continued to have some CP throughout He does note cough  Pain now is more right sided an pleuritic  Not worse with walking       Outpatient Medications Prior to Visit  Medication Sig Dispense Refill  . Calcium Carb-Cholecalciferol (CALCIUM 500 + D3) 500-600 MG-UNIT TABS Take 2 tablets by mouth daily. 60 tablet 6  . calcium carbonate (OS-CAL) 1250 (500 Ca) MG chewable tablet Chew 1 tablet by mouth daily as needed for heartburn.    . chlorhexidine (PERIDEX) 0.12 % solution Rinse with 15 mls three times daily for 30 seconds. Use after breakfast, lunch, and at bedtime. Spit out excess. Do not swallow. 960 mL prn  . Degarelix Acetate (FIRMAGON [place]) Inject into the skin every 28 (twenty-eight) days. Reported on 12/28/2015    . ergocalciferol (VITAMIN D2) 50000 units capsule Take 1 capsule (50,000 Units total) by mouth every Friday. 30 capsule 2  . fentaNYL (DURAGESIC - DOSED MCG/HR) 25 MCG/HR patch Place 1 patch (25 mcg total) onto the skin every 3 (three) days. Add to 50 mcg patch for total of 75 mcg 10 patch 0  . fentaNYL (DURAGESIC - DOSED MCG/HR) 50 MCG/HR Place 1 patch (50 mcg total) onto the skin every 3 (three) days. 10 patch 0  . HYDROcodone-acetaminophen (NORCO) 10-325 MG tablet Take 1 tablet by mouth every 4 (four) hours as needed. 60 tablet 0  . lidocaine-prilocaine (EMLA) cream Apply a quarter size amount to port site 1 hour prior to chemo. Do not rub in. Cover with plastic wrap. 30 g 3  . omeprazole  (PRILOSEC) 40 MG capsule Take 1 capsule (40 mg total) by mouth 2 (two) times daily. 60 capsule 3  . ondansetron (ZOFRAN) 8 MG tablet Take 1 tablet (8 mg total) by mouth every 8 (eight) hours as needed for nausea or vomiting. 30 tablet 2  . predniSONE (DELTASONE) 5 MG tablet Take 1 tablet (5 mg total) by mouth daily with breakfast. 60 tablet 1  . prochlorperazine (COMPAZINE) 10 MG tablet Take 1 tablet (10 mg total) by mouth every 6 (six) hours as needed for nausea or vomiting. 30 tablet 2  . dexamethasone (DECADRON) 4 MG tablet The day before, day of, and day after chemo take 2 tabs in am and 2 tabs in pm. Take with food. (Patient not taking: Reported on 06/02/2016) 36 tablet 1  . DOCEtaxel (TAXOTERE IV) Inject into the vein. Has not started yet, to be given every 21 days    . escitalopram (LEXAPRO) 10 MG tablet Take 1 tablet (10 mg total) by mouth daily. (Patient not taking: Reported on 06/02/2016) 30 tablet 3  . pegfilgrastim (NEULASTA ONPRO) 6 MG/0.6ML injection Inject 6 mg into the skin every 21 ( twenty-one) days. Inject via provided programmed delivery device.    . silver sulfADIAZINE (SILVADENE) 1 % cream Apply 1 application  topically 3 (three) times daily. 400 g 0   No facility-administered medications prior to visit.      Allergies:   Meloxicam   Past Medical History:  Diagnosis Date  . Arthritis    Left knee  . Bone metastases ([place]) 12/26/2015  . Cancer [facility])    Prostate  . DVT (deep venous thrombosis) ([place]) 2004  . Kidney stones    ~2002  . Prostate carcinoma ([place]) 12/26/2015    Past Surgical History:  Procedure Laterality Date  . CIRCUMCISION  30 years ago  . MULTIPLE EXTRACTIONS WITH ALVEOLOPLASTY N/A 03/06/2016   Procedure: Extraction of tooth #'s 3,5,6,8-12,20-22, 27, 28, and 31 with alveoloplasty and bilateral mandibular tori reductions;  Surgeon: Lenn Cal, DDS;  Location: [facility];  Service: Oral Surgery;  Laterality: N/A;     Social History:  The patient   reports that he has been smoking Cigarettes.  He started smoking about 51 years ago. He has a 50.00 pack-year smoking history. He quit smokeless tobacco use about 10 years ago. His smokeless tobacco use included Chew. He reports that he uses drugs, including Marijuana. He reports that he does not drink alcohol.   Family History:  The patient's family history includes Cancer in his mother.    ROS:  Please see the history of present illness. All other systems are reviewed and  Negative to the above problem except as noted.    PHYSICAL EXAM: VS:  BP 110/72 (BP Location: Right Arm)   Pulse 83   Ht 6\' 1"  (1.854 m)   Wt 238 lb (108 kg)   SpO2 95%   BMI 31.40 kg/m   GEN: Well nourished, well developed, in no acute distress  HEENT: normal  Neck: no JVD, carotid bruits, or masses Cardiac: RRR; no murmurs, rubs, or gallops,no edema  Chest  Nontender   Respiratory:  clear to auscultation bilaterally, normal work of breathing GI: soft, nontender, nondistended, + BS  No hepatomegaly  MS: no deformity Moving all extremities   Skin: warm and dry, no rash Neuro:  Strength and sensation are intact Psych: euthymic mood, full affect   EKG:  EKG is not ordered today.  In May 2017  SR     Lipid Panel No results found for: CHOL, TRIG, HDL, CHOLHDL, VLDL, LDLCALC, LDLDIRECT    Wt Readings from Last 3 Encounters:  06/16/16 238 lb (108 kg)  06/02/16 243 lb (110.2 kg)  04/21/16 235 lb (106.6 kg)      ASSESSMENT AND PLAN:  60 yo who is referred for CP  Histoy of metastatic prostate CA  Has undgeone chemotherapy CP has been present to some degree since last spring  Less intense now   Current discomfort is more R sided an pleuritic  He does have history of coughng   I do not think current symptoms represent coronary ischemia  Appears more musculoskel or pleuritic in origin    2  Aortic aneurysm  Patient with evid of perip vasc dz  Aorta 4.2 cm  I do not think this is causing him symptoms   Needs t  obe followed though  Would repeat CT in 1 yeatr Also would check lipids  He should be on a statin  I have written order so that he can have this drawn when he gets his other labs done  Will f/u with labs  F/U in clinic in 1 year       Current medicines are reviewed at length with the  patient today.  The patient does not have concerns regarding medicines.  Signed, Dorris Carnes, MD  06/16/2016 1:19 PM    [facility] [address], [place], [place]  [address] Phone: [phone]; Fax: [phone]

## 2016-06-16 NOTE — Patient Instructions (Signed)
Your physician wants you to follow-up in: 1 year with Dr Theressa Stamps will receive a reminder letter in the mail two months in advance. If you don't receive a letter, please call our office to schedule the follow-up appointment.      Your physician recommends that you continue on your current medications as directed. Please refer to the Current Medication list given to you today.       Thank you for choosing [facility] !

## 2016-06-28 ENCOUNTER — Encounter (HOSPITAL_COMMUNITY): Payer: Self-pay | Admitting: Hematology & Oncology

## 2016-07-01 ENCOUNTER — Encounter (HOSPITAL_COMMUNITY): Payer: Self-pay | Admitting: Lab

## 2016-07-01 ENCOUNTER — Encounter (HOSPITAL_COMMUNITY): Payer: Self-pay | Admitting: Hematology & Oncology

## 2016-07-01 ENCOUNTER — Other Ambulatory Visit (HOSPITAL_COMMUNITY)
Admission: RE | Admit: 2016-07-01 | Discharge: 2016-07-01 | Disposition: A | Discharge status: Home / Self Care | Payer: 59 | Source: Ambulatory Visit | Attending: Internal Medicine | Admitting: Internal Medicine

## 2016-07-01 ENCOUNTER — Encounter (HOSPITAL_COMMUNITY): Payer: 59

## 2016-07-01 ENCOUNTER — Encounter (HOSPITAL_BASED_OUTPATIENT_CLINIC_OR_DEPARTMENT_OTHER): Payer: 59

## 2016-07-01 ENCOUNTER — Encounter (HOSPITAL_COMMUNITY): Payer: 59 | Attending: Hematology & Oncology | Admitting: Hematology & Oncology

## 2016-07-01 VITALS — BP 101/76 | HR 95 | Temp 97.8°F | Resp 18 | Wt 239.4 lb

## 2016-07-01 DIAGNOSIS — J432 Centrilobular emphysema: Secondary | ICD-10-CM | POA: Diagnosis not present

## 2016-07-01 DIAGNOSIS — E785 Hyperlipidemia, unspecified: Secondary | ICD-10-CM | POA: Insufficient documentation

## 2016-07-01 DIAGNOSIS — Z809 Family history of malignant neoplasm, unspecified: Secondary | ICD-10-CM | POA: Insufficient documentation

## 2016-07-01 DIAGNOSIS — R634 Abnormal weight loss: Secondary | ICD-10-CM | POA: Insufficient documentation

## 2016-07-01 DIAGNOSIS — C7951 Secondary malignant neoplasm of bone: Secondary | ICD-10-CM

## 2016-07-01 DIAGNOSIS — I700 Atherosclerosis of aorta: Secondary | ICD-10-CM | POA: Diagnosis not present

## 2016-07-01 DIAGNOSIS — R3911 Hesitancy of micturition: Secondary | ICD-10-CM | POA: Diagnosis not present

## 2016-07-01 DIAGNOSIS — G893 Neoplasm related pain (acute) (chronic): Secondary | ICD-10-CM

## 2016-07-01 DIAGNOSIS — Z5111 Encounter for antineoplastic chemotherapy: Secondary | ICD-10-CM

## 2016-07-01 DIAGNOSIS — Z79899 Other long term (current) drug therapy: Secondary | ICD-10-CM | POA: Diagnosis not present

## 2016-07-01 DIAGNOSIS — C61 Malignant neoplasm of prostate: Secondary | ICD-10-CM

## 2016-07-01 DIAGNOSIS — F4321 Adjustment disorder with depressed mood: Secondary | ICD-10-CM

## 2016-07-01 DIAGNOSIS — M25552 Pain in left hip: Secondary | ICD-10-CM | POA: Diagnosis not present

## 2016-07-01 DIAGNOSIS — Z8546 Personal history of malignant neoplasm of prostate: Secondary | ICD-10-CM | POA: Insufficient documentation

## 2016-07-01 DIAGNOSIS — I714 Abdominal aortic aneurysm, without rupture: Secondary | ICD-10-CM | POA: Insufficient documentation

## 2016-07-01 DIAGNOSIS — Z9221 Personal history of antineoplastic chemotherapy: Secondary | ICD-10-CM | POA: Diagnosis not present

## 2016-07-01 DIAGNOSIS — F17210 Nicotine dependence, cigarettes, uncomplicated: Secondary | ICD-10-CM | POA: Insufficient documentation

## 2016-07-01 DIAGNOSIS — Z9889 Other specified postprocedural states: Secondary | ICD-10-CM | POA: Diagnosis not present

## 2016-07-01 DIAGNOSIS — Z720 Tobacco use: Secondary | ICD-10-CM

## 2016-07-01 DIAGNOSIS — Z87442 Personal history of urinary calculi: Secondary | ICD-10-CM | POA: Insufficient documentation

## 2016-07-01 DIAGNOSIS — R51 Headache: Secondary | ICD-10-CM | POA: Diagnosis not present

## 2016-07-01 DIAGNOSIS — R079 Chest pain, unspecified: Secondary | ICD-10-CM

## 2016-07-01 DIAGNOSIS — K219 Gastro-esophageal reflux disease without esophagitis: Secondary | ICD-10-CM | POA: Diagnosis not present

## 2016-07-01 LAB — LIPID PANEL
CHOL/HDL RATIO: 5.5 ratio
CHOLESTEROL: 235 mg/dL — AB (ref 0–200)
HDL: 43 mg/dL (ref >40)
LDL Cholesterol: 155 mg/dL — ABNORMAL HIGH (ref 0–99)
TRIGLYCERIDES: 186 mg/dL — AB (ref <150)
VLDL: 37 mg/dL (ref 0–40)

## 2016-07-01 LAB — COMPREHENSIVE METABOLIC PANEL
ALK PHOS: 87 U/L (ref 38–126)
ALT: 19 U/L (ref 17–63)
ANION GAP: 9 (ref 5–15)
AST: 21 U/L (ref 15–41)
Albumin: 3.9 g/dL (ref 3.5–5.0)
BILIRUBIN TOTAL: 0.4 mg/dL (ref 0.3–1.2)
BUN: 13 mg/dL (ref 6–20)
CALCIUM: 9.4 mg/dL (ref 8.9–10.3)
CO2: 24 mmol/L (ref 22–32)
CREATININE: 1.02 mg/dL (ref 0.61–1.24)
Chloride: 102 mmol/L (ref 101–111)
GFR calc non Af Amer: >60 mL/min (ref >60)
GLUCOSE: 110 mg/dL — AB (ref 65–99)
Potassium: 4 mmol/L (ref 3.5–5.1)
Sodium: 135 mmol/L (ref 135–145)
TOTAL PROTEIN: 7.1 g/dL (ref 6.5–8.1)

## 2016-07-01 LAB — CBC WITH DIFFERENTIAL/PLATELET
Basophils Absolute: 0 10*3/uL (ref 0.0–0.1)
Basophils Relative: 1 %
Eosinophils Absolute: 0.3 10*3/uL (ref 0.0–0.7)
Eosinophils Relative: 5 %
HEMATOCRIT: 44.4 % (ref 39.0–52.0)
HEMOGLOBIN: 14.9 g/dL (ref 13.0–17.0)
LYMPHS ABS: 2.9 10*3/uL (ref 0.7–4.0)
LYMPHS PCT: 40 %
MCH: 30 pg (ref 26.0–34.0)
MCHC: 33.6 g/dL (ref 30.0–36.0)
MCV: 89.5 fL (ref 78.0–100.0)
MONOS PCT: 6 %
Monocytes Absolute: 0.4 10*3/uL (ref 0.1–1.0)
NEUTROS ABS: 3.5 10*3/uL (ref 1.7–7.7)
NEUTROS PCT: 48 %
Platelets: 202 10*3/uL (ref 150–400)
RBC: 4.96 MIL/uL (ref 4.22–5.81)
RDW: 12.7 % (ref 11.5–15.5)
WBC: 7.2 10*3/uL (ref 4.0–10.5)

## 2016-07-01 LAB — PSA: PSA: 1.05 ng/mL (ref 0.00–4.00)

## 2016-07-01 MED ORDER — FENTANYL 25 MCG/HR TD PT72: 25.0000 ug | MEDICATED_PATCH | TRANSDERMAL | 0 refills | Status: DC

## 2016-07-01 MED ORDER — FENTANYL 50 MCG/HR TD PT72: 50.0000 ug | MEDICATED_PATCH | TRANSDERMAL | 0 refills | Status: DC

## 2016-07-01 MED ORDER — HYDROCODONE-ACETAMINOPHEN 10-325 MG PO TABS: 1.0000 | ORAL_TABLET | ORAL | 0 refills | Status: DC | PRN

## 2016-07-01 MED ORDER — DEGARELIX ACETATE 80 MG ~~LOC~~ SOLR
80.0000 mg | Freq: Once | SUBCUTANEOUS | Status: AC
  Administered 2016-07-01: 80 mg via SUBCUTANEOUS
  Filled 2016-07-01: qty 4

## 2016-07-01 NOTE — Progress Notes (Signed)
[name]  PROGRESS NOTE  Patient Care Team: [name], MD as PCP - General (Family Medicine)  CHIEF COMPLAINTS:  CT angiography chest w/contrast 12/10/2015 multiple thoracic and rib osseous lesions suggesting osseous metastatic disease. No obvious primary PSA 12/12/2015 153.85    Prostate carcinoma ([place])   12/10/2015 Imaging    CTA chest, multiple thoracic and rib osseous lesions. no primary evident. Atherosclerosis including aortic and CAD      12/12/2015 Imaging    CT abdomen/pelvis with sclerotic osseous lesions througout the lumbar spine, sacrum, bilateral iliac bones and L proximal femur worrisome for sclerotic osseous mets,mild prostatomegaly, no LAD, Infrarenal 3.8 cm AAA      12/12/2015 Tumor Marker    PSA 153.85      12/18/2015 Imaging    Bone Scan Diffuse bony metastatic disease, posterior calvarium, sternum, thoracic, lumbar spine, bilateral ribs, L shoulder, bilateral bony pelvis, proximal L femur      12/24/2015 Initial Biopsy    CT biopsy of L iliac bone lesion      12/26/2015 Pathology Results    Bone, biopsy, left iliac - POSITIVE FOR METASTATIC ADENOCARCINOMA.      12/28/2015 -  Chemotherapy    Firmagon instituted 240 mg       01/01/2016 Procedure    Port placed by IR.      01/07/2016 -  Chemotherapy    Docetaxel every 21 days      02/18/2016 Adverse Reaction    GI toxicity from docetaxel, dose reduced to 60 mg/m2      02/18/2016 Treatment Plan Change    Docetaxel dose reduced by 20%      03/06/2016 Procedure    Dr. Enrique Sack- Multiple extraction of tooth numbers 3, 5, 6, 8, 9, 10, 11, 12, 20, 21, 22, 27, 28, and 31. 4 Quadrants of alveoloplasty. Bilateral mandibular lingual tori reductions.      04/09/2016 Imaging    Brain MRI 1. No intracranial metastatic disease 2. No acute intracranial abnormality 3. Findings of chronic microvascular disease      05/14/2016 Imaging    CT C/A/P Stable appearing diffuse bony metastatic disease.  No new significant disease is seen Innumerable patchy sclerotic osseous metastases throughout the axial and proximal appendicular skeleton, stable in size and distribution, significantly increased in density in the interval, likely reflecting treatment effect. 2. No new or progressive metastatic disease in the chest, abdomen or pelvis. 3. Bilateral perifissural pulmonary nodules are stable and probably benign. 4. Aortic atherosclerosis. Infrarenal 4.3 cm abdominal aortic aneurysm, minimally increased in size. Recommend followup by ultrasound in 1 year.      06/02/2016 Tumor Marker    PSA 0.99 ng/ml        HISTORY OF PRESENTING ILLNESS:  Matthew [name] 60 y.o. male is here for further follow-up of stage IV prostate cancer. He is on firmagon (which he notes drastically improved his pain). He completed 6 cycles of taxotere/prednisone. He could not tolerate full dose taxotere secondary to GI side effects.   Patient is accompanied by his daughter today. He continues to experience chest pain and left hip pain. Patient has been evaluated by a cardiologist, Dr. Harrington Challenger. Dr. Harrington Challenger' note indicates that Christobal's symptoms appear more musculoskeletal in origin.   Patient has cut back on smoking. He now smokes one to two packs per week.   He has not had [place] injection since September. He has been cleared to start [place] by Dr. Enrique Sack.  Appetite is good. Weight  is stable.  Energy is so so. He finds it hard to do strenuous activity. Previous employment would be very difficult for him.  No nausea vomiting, diarrhea or constipation.   MEDICAL HISTORY:  Past Medical History:  Diagnosis Date  . Arthritis    Left knee  . Bone metastases ([place]) 12/26/2015  . Cancer [facility])    Prostate  . DVT (deep venous thrombosis) ([place]) 2004  . Kidney stones    ~2002  . Prostate carcinoma ([place]) 12/26/2015    SURGICAL HISTORY: Past Surgical History:  Procedure Laterality Date  . CIRCUMCISION  30 years ago    . MULTIPLE EXTRACTIONS WITH ALVEOLOPLASTY N/A 03/06/2016   Procedure: Extraction of tooth #'s 3,5,6,8-12,20-22, 27, 28, and 31 with alveoloplasty and bilateral mandibular tori reductions;  Surgeon: Lenn Cal, DDS;  Location: [facility];  Service: Oral Surgery;  Laterality: N/A;    SOCIAL HISTORY: Social History   Social History  . Marital status: Single    Spouse name: N/A  . Number of children: 2  . Years of education: N/A   Occupational History  . Construction/carpenter    Social History Main Topics  . Smoking status: Current Every Day Smoker    Packs/day: 1.00    Years: 50.00    Types: Cigarettes    Start date: 06/16/1965  . Smokeless tobacco: Former Systems developer    Types: Chew    Quit date: 08/04/2005  . Alcohol use No  . Drug use:     Types: Marijuana     Comment: THC for appetite  . Sexual activity: Not on file   Other Topics Concern  . Not on file   Social History Narrative  . No narrative on file  Divorced for 80 years 2 girls 4 grandchildren Smokes pack a day; Started at 60 years old Beer every once in a while  Hobbies-plays pool Works in Architect  FAMILY HISTORY: Family History  Problem Relation Age of Onset  . Cancer Mother     Cory Roughen (adopted him) Mother died in 44s from bone cancer Never met father Brothers and sisters all died from cancer 3 brother 3 sisters  ALLERGIES:  is allergic to meloxicam.  MEDICATIONS:  Current Outpatient Prescriptions  Medication Sig Dispense Refill  . Calcium Carb-Cholecalciferol (CALCIUM 500 + D3) 500-600 MG-UNIT TABS Take 2 tablets by mouth daily. 60 tablet 6  . calcium carbonate (OS-CAL) 1250 (500 Ca) MG chewable tablet Chew 1 tablet by mouth daily as needed for heartburn.    . chlorhexidine (PERIDEX) 0.12 % solution Rinse with 15 mls three times daily for 30 seconds. Use after breakfast, lunch, and at bedtime. Spit out excess. Do not swallow. 960 mL prn  . Degarelix Acetate (FIRMAGON [place]) Inject into the skin  every 28 (twenty-eight) days. Reported on 12/28/2015    . ergocalciferol (VITAMIN D2) 50000 units capsule Take 1 capsule (50,000 Units total) by mouth every Friday. 30 capsule 2  . fentaNYL (DURAGESIC - DOSED MCG/HR) 25 MCG/HR patch Place 1 patch (25 mcg total) onto the skin every 3 (three) days. Add to 50 mcg patch for total of 75 mcg 10 patch 0  . fentaNYL (DURAGESIC - DOSED MCG/HR) 50 MCG/HR Place 1 patch (50 mcg total) onto the skin every 3 (three) days. 10 patch 0  . HYDROcodone-acetaminophen (NORCO) 10-325 MG tablet Take 1 tablet by mouth every 4 (four) hours as needed. 60 tablet 0  . lidocaine-prilocaine (EMLA) cream Apply a quarter size amount to port site 1 hour  prior to chemo. Do not rub in. Cover with plastic wrap. 30 g 3  . omeprazole (PRILOSEC) 40 MG capsule Take 1 capsule (40 mg total) by mouth 2 (two) times daily. 60 capsule 3  . ondansetron (ZOFRAN) 8 MG tablet Take 1 tablet (8 mg total) by mouth every 8 (eight) hours as needed for nausea or vomiting. 30 tablet 2  . predniSONE (DELTASONE) 5 MG tablet Take 1 tablet (5 mg total) by mouth daily with breakfast. 60 tablet 1  . prochlorperazine (COMPAZINE) 10 MG tablet Take 1 tablet (10 mg total) by mouth every 6 (six) hours as needed for nausea or vomiting. 30 tablet 2   No current facility-administered medications for this visit.     Review of Systems  Constitutional: Positive for malaise/fatigue. Negative for weight loss.  Eyes: Negative.   Respiratory: Positive for shortness of breath.        SOB with chest pain  Gastrointestinal: Negative for abdominal pain, diarrhea, heartburn, nausea and vomiting.  Musculoskeletal: Positive for joint pain.       Hip pain, left knee pain, chest (sternum) pain  Skin: Negative.   Endo/Heme/Allergies: Negative.   Psychiatric/Behavioral: Negative.   All other systems reviewed and are negative. 14 point ROS was done and is otherwise as detailed above or in HPI   PHYSICAL EXAMINATION: ECOG  PERFORMANCE STATUS: 1 - Symptomatic but completely ambulatory  Vitals with BMI 07/01/2016  Height   Weight 239 lbs 6 oz  BMI   Systolic 643  Diastolic 76  Pulse 95  Respirations 18     Physical Exam  Constitutional: He is oriented to person, place, and time and well-developed, well-nourished, and in no distress.  HENT:  Head: Normocephalic and atraumatic.  Nose: Nose normal.  Mouth/Throat: Oropharynx is clear and moist. No oropharyngeal exudate.  Edentulous  Eyes: Conjunctivae and EOM are normal. Pupils are equal, round, and reactive to light. Right eye exhibits no discharge. Left eye exhibits no discharge. No scleral icterus.  Neck: Normal range of motion. Neck supple. No tracheal deviation present. No thyromegaly present.  Cardiovascular: Normal rate, regular rhythm and normal heart sounds.  Exam reveals no gallop and no friction rub.   No murmur heard. Pulmonary/Chest: Effort normal. He has no wheezes. He has no rales.  Decreased throughout but clear  Abdominal: Soft. Bowel sounds are normal. He exhibits no distension and no mass. There is no tenderness. There is no rebound and no guarding.  Musculoskeletal: Normal range of motion. He exhibits no edema.  Lymphadenopathy:    He has no cervical adenopathy.  Neurological: He is alert and oriented to person, place, and time. He has normal reflexes. No cranial nerve deficit. Gait normal. Coordination normal.  Skin: Skin is warm and dry. No rash noted.  Psychiatric: Mood, memory, affect and judgment normal.  Nursing note and vitals reviewed.  LABORATORY DATA:  I have reviewed the data as listed  CBC    Component Value Date/Time   WBC 7.2 07/01/2016 0921   RBC 4.96 07/01/2016 0921   HGB 14.9 07/01/2016 0921   HCT 44.4 07/01/2016 0921   PLT 202 07/01/2016 0921   MCV 89.5 07/01/2016 0921   MCH 30.0 07/01/2016 0921   MCHC 33.6 07/01/2016 0921   RDW 12.7 07/01/2016 0921   LYMPHSABS 2.9 07/01/2016 0921   MONOABS 0.4  07/01/2016 0921   EOSABS 0.3 07/01/2016 0921   BASOSABS 0.0 07/01/2016 0921   CMP     Component Value Date/Time  NA 135 07/01/2016 0921   K 4.0 07/01/2016 0921   CL 102 07/01/2016 0921   CO2 24 07/01/2016 0921   GLUCOSE 110 (H) 07/01/2016 0921   BUN 13 07/01/2016 0921   CREATININE 1.02 07/01/2016 0921   CALCIUM 9.4 07/01/2016 0921   PROT 7.1 07/01/2016 0921   ALBUMIN 3.9 07/01/2016 0921   AST 21 07/01/2016 0921   ALT 19 07/01/2016 0921   ALKPHOS 87 07/01/2016 0921   BILITOT 0.4 07/01/2016 0921   GFRNONAA >60 07/01/2016 0921   GFRAA >60 07/01/2016 0921   Results for JERAMI, TAMMEN (MRN [id number])   Ref. Range 02/18/2016 09:59 03/10/2016 10:17 03/31/2016 10:10 04/21/2016 10:32 06/02/2016 09:31  PSA Latest Ref Range: 0.00 - 4.00 ng/mL 12.57 (H) 6.17 (H) 3.12 2.10 0.99     RADIOGRAPHIC STUDIES: I have personally reviewed the radiological images as listed and agreed with the findings in the report. Study Result   CLINICAL DATA:  Stage IV prostate cancer diagnosed May 2017 presenting for restaging with ongoing chemotherapy.  EXAM: CT CHEST, ABDOMEN, AND PELVIS WITH CONTRAST  TECHNIQUE: Multidetector CT imaging of the chest, abdomen and pelvis was performed following the standard protocol during bolus administration of intravenous contrast.  CONTRAST:  160m ISOVUE-300 IOPAMIDOL (ISOVUE-300) INJECTION 61%  COMPARISON:  12/10/2015 chest CT angiogram. 12/12/2015 CT abdomen/ pelvis.  FINDINGS: CT CHEST FINDINGS  Cardiovascular: Normal heart size. No significant pericardial fluid/thickening. Right internal jugular MediPort terminates at the cavoatrial junction. Great vessels are normal in course and caliber. No central pulmonary emboli.  Mediastinum/Nodes: No discrete thyroid nodules. Unremarkable esophagus. No pathologically enlarged axillary, mediastinal or hilar lymph nodes.  Lungs/Pleura: No pneumothorax. No pleural effusion. Subpleural anterior right lower  lobe 6 mm pulmonary nodule associated with the right major fissure (series 3/ image 97) is stable since 12/10/2015. Subpleural anterior left lower lobe 5 mm pulmonary nodule associated with the left major fissure (series 3/image 80) is stable. No acute consolidative airspace disease, lung masses or new significant pulmonary nodules. Mild centrilobular emphysema with mild diffuse bronchial wall thickening.  Musculoskeletal: Innumerable patchy sclerotic osseous metastases throughout the entire thoracic skeleton including the bilateral shoulder girdles, sternum, bilateral ribs and thoracic spine are significantly increased in density since 12/10/2015 and appear stable in size and distribution. Moderate thoracic spondylosis.  CT ABDOMEN PELVIS FINDINGS  Hepatobiliary: Normal liver with no liver mass. Normal gallbladder with no radiopaque cholelithiasis. No biliary ductal dilatation.  Pancreas: Normal, with no mass or duct dilation.  Spleen: Normal size. No mass.  Adrenals/Urinary Tract: Normal adrenals. No hydronephrosis. Simple renal cysts in the lower right kidney measuring up to 6.2 cm. Numerous subcentimeter hypodense renal cortical lesions in both kidneys are too small to characterize and are not appreciably changed, most consistent with benign renal cysts. Relatively collapsed and grossly normal bladder.  Stomach/Bowel: Grossly normal stomach. Normal caliber small bowel with no small bowel wall thickening. Normal appendix. Normal large bowel with no diverticulosis, large bowel wall thickening or pericolonic fat stranding.  Vascular/Lymphatic: Abdominal aortic atherosclerosis. Infrarenal abdominal aortic aneurysm measures 4.3 cm maximum diameter, previously 4.2 cm using similar measurement technique, minimally increased. Patent portal, splenic, hepatic and renal veins. No pathologically enlarged lymph nodes in the abdomen or pelvis.  Reproductive: Top-normal size  prostate is decreased in size (transverse diameter 4.0 cm, previously 4.9 cm). Nonspecific internal prostatic calcifications.  Other: No pneumoperitoneum, ascites or focal fluid collection.  Musculoskeletal: Innumerable scattered and confluent sclerotic osseous metastases throughout the abdominopelvic skeleton, including the lumbar spine,  sacrum, bilateral iliac bones and left proximal femur, are significantly increased in density since 12/12/2015, and appear stable in size and distribution. Mild lumbar spondylosis.  IMPRESSION: 1. Innumerable patchy sclerotic osseous metastases throughout the axial and proximal appendicular skeleton, stable in size and distribution, significantly increased in density in the interval, likely reflecting treatment effect. 2. No new or progressive metastatic disease in the chest, abdomen or pelvis. 3. Bilateral perifissural pulmonary nodules are stable and probably benign. 4. Aortic atherosclerosis. Infrarenal 4.3 cm abdominal aortic aneurysm, minimally increased in size. Recommend followup by ultrasound in 1 year. This recommendation follows ACR consensus guidelines: White Paper of the ACR Incidental Findings Committee II on Vascular Findings. J Am Coll Radiol 2013; 10:789-794. 5. Mild centrilobular emphysema with mild diffuse bronchial wall thickening, suggesting COPD.   Electronically Signed   By: Ilona Sorrel M.D.   On: 05/14/2016 15:37   Study Result   CLINICAL DATA:  History of prostate carcinoma with increasing neck and back pain common no known injury, initial encounter  EXAM: NUCLEAR MEDICINE WHOLE BODY BONE SCAN  TECHNIQUE: Whole body anterior and posterior images were obtained approximately 3 hours after intravenous injection of radiopharmaceutical.  RADIOPHARMACEUTICALS:  19.9 mCi Technetium-11mMDP IV  COMPARISON:  12/18/2015  FINDINGS: There again noted is areas of increased activity in the posterior calvarium,  Rib cage, thoracic spine, lumbar spine, proximal left femur, sacrum, left shoulder and pelvic bones similar to that noted on the prior exam. No new focal area of increased activity is identified to correspond progressive metastatic disease.  Degenerative changes are again seen within the knee joints predominately on the left as well as about the shoulder girdles bilaterally.  IMPRESSION: Stable appearing diffuse bony metastatic disease. No new significant disease is seen.   Electronically Signed   By: MInez CatalinaM.D.   On: 05/12/2016 15:46     PATHOLOGY   ASSESSMENT & PLAN:  Stage IV adenocarcinoma of prostate, hormone sensitive disease Bony Metastatic Disease Abnormal CT imaging Elevated PSA Urinary hesitancy, frequency Tobacco Abuse Cancer related Pain Weight loss GERD Situational Depression headache  I was going to transition him to [name] but he has missed two of his firmagon injections for reasons that are not currently clear to me. We are going to look into this today. He will get his firmagon today. XDelton Seewill be preauthorized and he will return to receive his XGEVA.   He has cut back on his smoking significantly. We will continue to work on this.   He continues to experience chest (sternal) pain and left hip pain. I highly recommended palliative radiation. [place] to do [place] I have made the referral.  I have refilled his pain medication.  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   Follow up in one month with labs including testosterone. Will transition to [name] after he has been back on firmagon X 2 months.   Meds ordered this encounter  Medications  . fentaNYL (DURAGESIC - DOSED MCG/HR) 25 MCG/HR patch    Sig: Place 1 patch (25 mcg total) onto the skin every 3 (three) days. Add to 50 mcg patch for total of 75 mcg    Dispense:  10 patch    Refill:  0    . fentaNYL (DURAGESIC - DOSED MCG/HR) 50 MCG/HR    Sig: Place 1 patch (50 mcg total) onto the skin every 3 (three) days.  Dispense:  10 patch    Refill:  0  . HYDROcodone-acetaminophen (NORCO) 10-325 MG tablet    Sig: Take 1 tablet by mouth every 4 (four) hours as needed.    Dispense:  60 tablet    Refill:  0   Orders Placed This Encounter  Procedures  . CBC with Differential    Standing Status:   Future    Standing Expiration Date:   07/01/2017  . Comprehensive metabolic panel    Standing Status:   Future    Standing Expiration Date:   07/01/2017  . PSA    Standing Status:   Future    Standing Expiration Date:   07/01/2017  . Testosterone, free    Standing Status:   Future    Standing Expiration Date:   07/01/2017  . Testosterone, % free    Standing Status:   Future    Standing Expiration Date:   07/01/2017  . Testosterone    Standing Status:   Future    Standing Expiration Date:   07/01/2017   All questions were answered. The patient knows to call the clinic with any problems, questions or concerns.  This document serves as a record of services personally performed by Ancil Linsey, MD. It was created on her behalf by Elmyra Ricks, a trained medical scribe. The creation of this record is based on the scribe's personal observations and the provider's statements to them. This document has been checked and approved by the attending provider.  I have reviewed the above documentation for accuracy and completeness and I agree with the above.  This note was electronically signed.    Molli Hazard, MD  07/01/2016 11:40 AM

## 2016-07-01 NOTE — Patient Instructions (Addendum)
[place] at [facility] Discharge Instructions  RECOMMENDATIONS MADE BY THE CONSULTANT AND ANY TEST RESULTS WILL BE SENT TO YOUR REFERRING PHYSICIAN.  Exam with Dr. Aneesh Faller Muse today. Firmagon injection today.  We have to get authorization from insurance to give you the Xgeva.  We will call you once we get that to have you come in for the injection.  Please see Amy as you leave for your follow up appointments.  Thank you for choosing [place] at [facility] to provide your oncology and hematology care.  To afford each patient quality time with our provider, please arrive at least 15 minutes before your scheduled appointment time.   Beginning January 23rd 2017 lab work for the [facility] will be done in the  Main lab at [facility] on 1st floor. If you have a lab appointment with the [facility] please come in thru the  Main Entrance and check in at the main information desk  You need to re-schedule your appointment should you arrive 10 or more minutes late.  We strive to give you quality time with our providers, and arriving late affects you and other patients whose appointments are after yours.  Also, if you no show three or more times for appointments you may be dismissed from the clinic at the providers discretion.     Again, thank you for choosing [facility].  Our hope is that these requests will decrease the amount of time that you wait before being seen by our physicians.       _____________________________________________________________  Should you have questions after your visit to [facility], please contact our office at [phone] between the hours of 8:30 a.m. and 4:30 p.m.  Voicemails left after 4:30 p.m. will not be returned until the following business day.  For prescription refill requests, have your pharmacy contact our office.         Resources For Cancer Patients and their  Caregivers ? American Cancer Society: Can assist with transportation, wigs, general needs, runs Look Good Feel Better.        301 184 7751 ? Cancer Care: Provides financial assistance, online support groups, medication/co-pay assistance.  1-800-813-HOPE 308-496-8763) ? [facility] Assists [place] cancer patients and their families through emotional , educational and financial support.  952-039-1878 ? [place] DSS Where to apply for food stamps, Medicaid and utility assistance. (662) 178-0015 ? [facility]: Transportation to medical appointments. 236-517-6433 ? Social Security Administration: May apply for disability if have a Stage IV cancer. (404)671-6631 615-117-0158 ? [employer], Disability and Transit Services: Assists with nutrition, care and transit needs. [place] Support Programs: @10RELATIVEDAYS @ > Cancer Support Group  2nd Tuesday of the month 1pm-2pm, Journey Room  > Creative Journey  3rd Tuesday of the month 1130am-1pm, Journey Room  > Look Good Feel Better  1st Wednesday of the month 10am-12 noon, Journey Room (Call [place] to register [phone])

## 2016-07-01 NOTE — Progress Notes (Unsigned)
Referral sent to [facility].  Records faxed on 11/28.  They will contact patient with appt.

## 2016-07-02 ENCOUNTER — Other Ambulatory Visit: Payer: Self-pay | Admitting: *Deleted

## 2016-07-02 DIAGNOSIS — E7800 Pure hypercholesterolemia, unspecified: Secondary | ICD-10-CM

## 2016-07-02 MED ORDER — ROSUVASTATIN CALCIUM 20 MG PO TABS
20.0000 mg | ORAL_TABLET | Freq: Every day | ORAL | 3 refills | Status: DC
Start: 2016-07-02 — End: 2017-08-05

## 2016-07-07 ENCOUNTER — Ambulatory Visit (HOSPITAL_COMMUNITY): Payer: Self-pay

## 2016-07-08 ENCOUNTER — Encounter (HOSPITAL_COMMUNITY): Payer: 59 | Attending: Hematology & Oncology

## 2016-07-08 ENCOUNTER — Encounter (HOSPITAL_COMMUNITY): Payer: Self-pay

## 2016-07-08 VITALS — BP 118/74 | HR 76 | Temp 98.2°F | Resp 18

## 2016-07-08 DIAGNOSIS — R972 Elevated prostate specific antigen [PSA]: Secondary | ICD-10-CM | POA: Insufficient documentation

## 2016-07-08 DIAGNOSIS — R3911 Hesitancy of micturition: Secondary | ICD-10-CM | POA: Insufficient documentation

## 2016-07-08 DIAGNOSIS — R634 Abnormal weight loss: Secondary | ICD-10-CM | POA: Insufficient documentation

## 2016-07-08 DIAGNOSIS — Z79899 Other long term (current) drug therapy: Secondary | ICD-10-CM | POA: Insufficient documentation

## 2016-07-08 DIAGNOSIS — Z7982 Long term (current) use of aspirin: Secondary | ICD-10-CM | POA: Insufficient documentation

## 2016-07-08 DIAGNOSIS — I82409 Acute embolism and thrombosis of unspecified deep veins of unspecified lower extremity: Secondary | ICD-10-CM | POA: Insufficient documentation

## 2016-07-08 DIAGNOSIS — F17210 Nicotine dependence, cigarettes, uncomplicated: Secondary | ICD-10-CM | POA: Insufficient documentation

## 2016-07-08 DIAGNOSIS — C7951 Secondary malignant neoplasm of bone: Secondary | ICD-10-CM | POA: Insufficient documentation

## 2016-07-08 DIAGNOSIS — C61 Malignant neoplasm of prostate: Secondary | ICD-10-CM

## 2016-07-08 DIAGNOSIS — M1990 Unspecified osteoarthritis, unspecified site: Secondary | ICD-10-CM | POA: Insufficient documentation

## 2016-07-08 MED ORDER — DENOSUMAB 120 MG/1.7ML ~~LOC~~ SOLN
120.0000 mg | Freq: Once | SUBCUTANEOUS | Status: AC
  Administered 2016-07-08: 120 mg via SUBCUTANEOUS
  Filled 2016-07-08: qty 1.7

## 2016-07-08 NOTE — Patient Instructions (Signed)
[place] at [facility] Discharge Instructions  RECOMMENDATIONS MADE BY THE CONSULTANT AND ANY TEST RESULTS WILL BE SENT TO YOUR REFERRING PHYSICIAN.  Xgeva injection today. Return as scheduled for injections. Return as scheduled for lab work and office visit.   Thank you for choosing [place] at [facility] to provide your oncology and hematology care.  To afford each patient quality time with our provider, please arrive at least 15 minutes before your scheduled appointment time.   Beginning January 23rd 2017 lab work for the [facility] will be done in the  Main lab at [facility] on 1st floor. If you have a lab appointment with the [place] please come in thru the  Main Entrance and check in at the main information desk  You need to re-schedule your appointment should you arrive 10 or more minutes late.  We strive to give you quality time with our providers, and arriving late affects you and other patients whose appointments are after yours.  Also, if you no show three or more times for appointments you may be dismissed from the clinic at the providers discretion.     Again, thank you for choosing [facility].  Our hope is that these requests will decrease the amount of time that you wait before being seen by our physicians.       _____________________________________________________________  Should you have questions after your visit to [facility], please contact our office at [phone] between the hours of 8:30 a.m. and 4:30 p.m.  Voicemails left after 4:30 p.m. will not be returned until the following business day.  For prescription refill requests, have your pharmacy contact our office.         Resources For Cancer Patients and their Caregivers ? American Cancer Society: Can assist with transportation, wigs, general needs, runs Look Good Feel Better.        831-177-3925 ? Cancer  Care: Provides financial assistance, online support groups, medication/co-pay assistance.  1-800-813-HOPE (978)339-7405) ? [place] Assists [place] cancer patients and their families through emotional , educational and financial support.  (205)529-3230 ? [place] DSS Where to apply for food stamps, Medicaid and utility assistance. (801)258-6414 ? RCATS: Transportation to medical appointments. 806-830-4189 ? Social Security Administration: May apply for disability if have a Stage IV cancer. (831)356-4518 321-670-2880 ? [employer], Disability and Transit Services: Assists with nutrition, care and transit needs. [place] Support Programs: @10RELATIVEDAYS @ > Cancer Support Group  2nd Tuesday of the month 1pm-2pm, Journey Room  > Creative Journey  3rd Tuesday of the month 1130am-1pm, Journey Room  > Look Good Feel Better  1st Wednesday of the month 10am-12 noon, Journey Room (Call [place] to register [phone])

## 2016-07-08 NOTE — Progress Notes (Signed)
Matthew [name] presents today for injection per the provider's orders.  Xgeva administration without incident; see MAR for injection details.  Patient tolerated procedure well and without incident.  No questions or complaints noted at this time. Alert, in no distress.  Discharged ambulatory.

## 2016-07-25 ENCOUNTER — Other Ambulatory Visit (HOSPITAL_COMMUNITY): Payer: Self-pay | Admitting: Hematology & Oncology

## 2016-07-25 DIAGNOSIS — C61 Malignant neoplasm of prostate: Secondary | ICD-10-CM

## 2016-07-31 ENCOUNTER — Ambulatory Visit (HOSPITAL_COMMUNITY): Payer: Self-pay | Admitting: Oncology

## 2016-07-31 ENCOUNTER — Ambulatory Visit (HOSPITAL_COMMUNITY): Payer: Self-pay

## 2016-07-31 ENCOUNTER — Ambulatory Visit (HOSPITAL_COMMUNITY): Payer: Self-pay | Admitting: Hematology & Oncology

## 2016-07-31 ENCOUNTER — Other Ambulatory Visit (HOSPITAL_COMMUNITY): Payer: Self-pay

## 2016-08-01 ENCOUNTER — Encounter (HOSPITAL_COMMUNITY): Payer: 59

## 2016-08-01 ENCOUNTER — Encounter (HOSPITAL_BASED_OUTPATIENT_CLINIC_OR_DEPARTMENT_OTHER): Payer: 59 | Admitting: Oncology

## 2016-08-01 ENCOUNTER — Encounter (HOSPITAL_BASED_OUTPATIENT_CLINIC_OR_DEPARTMENT_OTHER): Payer: 59

## 2016-08-01 ENCOUNTER — Encounter (HOSPITAL_COMMUNITY): Payer: Self-pay | Admitting: Oncology

## 2016-08-01 VITALS — BP 89/65 | HR 89 | Temp 98.6°F | Resp 16 | Wt 235.2 lb

## 2016-08-01 DIAGNOSIS — Z79899 Other long term (current) drug therapy: Secondary | ICD-10-CM | POA: Diagnosis not present

## 2016-08-01 DIAGNOSIS — R112 Nausea with vomiting, unspecified: Secondary | ICD-10-CM | POA: Diagnosis not present

## 2016-08-01 DIAGNOSIS — F17210 Nicotine dependence, cigarettes, uncomplicated: Secondary | ICD-10-CM | POA: Diagnosis not present

## 2016-08-01 DIAGNOSIS — R197 Diarrhea, unspecified: Secondary | ICD-10-CM

## 2016-08-01 DIAGNOSIS — C61 Malignant neoplasm of prostate: Secondary | ICD-10-CM

## 2016-08-01 DIAGNOSIS — Z5111 Encounter for antineoplastic chemotherapy: Secondary | ICD-10-CM | POA: Diagnosis not present

## 2016-08-01 DIAGNOSIS — K219 Gastro-esophageal reflux disease without esophagitis: Secondary | ICD-10-CM

## 2016-08-01 DIAGNOSIS — K5900 Constipation, unspecified: Secondary | ICD-10-CM

## 2016-08-01 DIAGNOSIS — I959 Hypotension, unspecified: Secondary | ICD-10-CM | POA: Diagnosis not present

## 2016-08-01 DIAGNOSIS — C7951 Secondary malignant neoplasm of bone: Secondary | ICD-10-CM

## 2016-08-01 DIAGNOSIS — I82409 Acute embolism and thrombosis of unspecified deep veins of unspecified lower extremity: Secondary | ICD-10-CM | POA: Diagnosis not present

## 2016-08-01 DIAGNOSIS — Z7982 Long term (current) use of aspirin: Secondary | ICD-10-CM | POA: Diagnosis not present

## 2016-08-01 DIAGNOSIS — R3911 Hesitancy of micturition: Secondary | ICD-10-CM | POA: Diagnosis not present

## 2016-08-01 DIAGNOSIS — M1990 Unspecified osteoarthritis, unspecified site: Secondary | ICD-10-CM | POA: Diagnosis not present

## 2016-08-01 DIAGNOSIS — E860 Dehydration: Secondary | ICD-10-CM

## 2016-08-01 DIAGNOSIS — R634 Abnormal weight loss: Secondary | ICD-10-CM | POA: Diagnosis not present

## 2016-08-01 DIAGNOSIS — R972 Elevated prostate specific antigen [PSA]: Secondary | ICD-10-CM | POA: Diagnosis not present

## 2016-08-01 LAB — CBC WITH DIFFERENTIAL/PLATELET
Basophils Absolute: 0 10*3/uL (ref 0.0–0.1)
Basophils Relative: 0 %
EOS PCT: 6 %
Eosinophils Absolute: 0.2 10*3/uL (ref 0.0–0.7)
HEMATOCRIT: 41.7 % (ref 39.0–52.0)
Hemoglobin: 13.9 g/dL (ref 13.0–17.0)
LYMPHS ABS: 0.5 10*3/uL — AB (ref 0.7–4.0)
LYMPHS PCT: 11 %
MCH: 30 pg (ref 26.0–34.0)
MCHC: 33.3 g/dL (ref 30.0–36.0)
MCV: 89.9 fL (ref 78.0–100.0)
MONO ABS: 0.2 10*3/uL (ref 0.1–1.0)
Monocytes Relative: 5 %
NEUTROS ABS: 3.3 10*3/uL (ref 1.7–7.7)
Neutrophils Relative %: 78 %
PLATELETS: 116 10*3/uL — AB (ref 150–400)
RBC: 4.64 MIL/uL (ref 4.22–5.81)
RDW: 13.5 % (ref 11.5–15.5)
WBC: 4.2 10*3/uL (ref 4.0–10.5)

## 2016-08-01 LAB — COMPREHENSIVE METABOLIC PANEL
ALT: 18 U/L (ref 17–63)
AST: 18 U/L (ref 15–41)
Albumin: 3.7 g/dL (ref 3.5–5.0)
Alkaline Phosphatase: 61 U/L (ref 38–126)
Anion gap: 6 (ref 5–15)
BILIRUBIN TOTAL: 0.5 mg/dL (ref 0.3–1.2)
BUN: 8 mg/dL (ref 6–20)
CHLORIDE: 105 mmol/L (ref 101–111)
CO2: 24 mmol/L (ref 22–32)
CREATININE: 0.88 mg/dL (ref 0.61–1.24)
Calcium: 7.8 mg/dL — ABNORMAL LOW (ref 8.9–10.3)
Glucose, Bld: 116 mg/dL — ABNORMAL HIGH (ref 65–99)
Potassium: 4 mmol/L (ref 3.5–5.1)
Sodium: 135 mmol/L (ref 135–145)
TOTAL PROTEIN: 6.7 g/dL (ref 6.5–8.1)

## 2016-08-01 LAB — PSA: PSA: 0.84 ng/mL (ref 0.00–4.00)

## 2016-08-01 MED ORDER — SODIUM CHLORIDE 0.9% FLUSH
10.0000 mL | INTRAVENOUS | Status: DC | PRN
  Administered 2016-08-01: 10 mL via INTRAVENOUS
  Filled 2016-08-01: qty 10

## 2016-08-01 MED ORDER — FENTANYL 50 MCG/HR TD PT72: 50.0000 ug | MEDICATED_PATCH | TRANSDERMAL | 0 refills | Status: DC

## 2016-08-01 MED ORDER — FENTANYL 25 MCG/HR TD PT72: 25.0000 ug | MEDICATED_PATCH | TRANSDERMAL | 0 refills | Status: DC

## 2016-08-01 MED ORDER — DEGARELIX ACETATE 80 MG ~~LOC~~ SOLR
80.0000 mg | Freq: Once | SUBCUTANEOUS | Status: AC
  Administered 2016-08-01: 80 mg via SUBCUTANEOUS
  Filled 2016-08-01: qty 4

## 2016-08-01 MED ORDER — SODIUM CHLORIDE 0.9 % IV SOLN
INTRAVENOUS | Status: DC
  Administered 2016-08-01: 15:00:00 via INTRAVENOUS

## 2016-08-01 MED ORDER — HEPARIN SOD (PORK) LOCK FLUSH 100 UNIT/ML IV SOLN
500.0000 [IU] | Freq: Once | INTRAVENOUS | Status: AC
  Administered 2016-08-01: 500 [IU] via INTRAVENOUS

## 2016-08-01 MED ORDER — ERGOCALCIFEROL 1.25 MG (50000 UT) PO CAPS: 50000.0000 [IU] | ORAL_CAPSULE | ORAL | 2 refills | Status: DC

## 2016-08-01 NOTE — Progress Notes (Signed)
Patient received one liter of IVF today per Kirby Crigler PA-C. No problems noted.  Vitals stable, discharged home ambulatory from clinic.

## 2016-08-01 NOTE — Progress Notes (Signed)
Matthew [name] tolerated hydration well without complaints or incident.VSS Pt discharged self ambulatory in satisfactory condition.

## 2016-08-01 NOTE — Progress Notes (Signed)
Matthew [name] presents today for injection per MD orders. Firmagon 80 mg  administered SQ in the abdomen Administration without incident. Patient tolerated well.   No Xgeva given due to abnormal labs per MD.

## 2016-08-01 NOTE — Patient Instructions (Signed)
[place] at [facility] Discharge Instructions  RECOMMENDATIONS MADE BY THE CONSULTANT AND ANY TEST RESULTS WILL BE SENT TO YOUR REFERRING PHYSICIAN.  firmagon given and one unit of hydration fluids given. Follow up as scheduled.  Thank you for choosing [place] at [facility] to provide your oncology and hematology care.  To afford each patient quality time with our provider, please arrive at least 15 minutes before your scheduled appointment time.    If you have a lab appointment with the [place] please come in thru the  Main Entrance and check in at the main information desk  You need to re-schedule your appointment should you arrive 10 or more minutes late.  We strive to give you quality time with our providers, and arriving late affects you and other patients whose appointments are after yours.  Also, if you no show three or more times for appointments you may be dismissed from the clinic at the providers discretion.     Again, thank you for choosing [facility].  Our hope is that these requests will decrease the amount of time that you wait before being seen by our physicians.       _____________________________________________________________  Should you have questions after your visit to [facility], please contact our office at [phone] between the hours of 8:30 a.m. and 4:30 p.m.  Voicemails left after 4:30 p.m. will not be returned until the following business day.  For prescription refill requests, have your pharmacy contact our office.       Resources For Cancer Patients and their Caregivers ? American Cancer Society: Can assist with transportation, wigs, general needs, runs Look Good Feel Better.        610-538-9548 ? Cancer Care: Provides financial assistance, online support groups, medication/co-pay assistance.  1-800-813-HOPE 269-736-2793) ? [place] Assists  [place] cancer patients and their families through emotional , educational and financial support.  703-586-4979 ? [place] DSS Where to apply for food stamps, Medicaid and utility assistance. 979-325-3932 ? RCATS: Transportation to medical appointments. 780-351-5682 ? Social Security Administration: May apply for disability if have a Stage IV cancer. 312 620 1030 4146264208 ? [employer], Disability and Transit Services: Assists with nutrition, care and transit needs. [place] Support Programs: @10RELATIVEDAYS @ > Cancer Support Group  2nd Tuesday of the month 1pm-2pm, Journey Room  > Creative Journey  3rd Tuesday of the month 1130am-1pm, Journey Room  > Look Good Feel Better  1st Wednesday of the month 10am-12 noon, Journey Room (Call [place] to register [phone])

## 2016-08-01 NOTE — Patient Instructions (Addendum)
[place] at [facility] Discharge Instructions  RECOMMENDATIONS MADE BY THE CONSULTANT AND ANY TEST RESULTS WILL BE SENT TO YOUR REFERRING PHYSICIAN.  You were seen today by Kirby Crigler, PA-C We will defer [name] today due to lab values You will receive 1 liter of fluids today to reverse dehydration Firmagon given today Recommended take Maalox or Mylanta for GERD Recommended take 2 tums daily for low calcium levels Take Calcium ++ 1200mg  and a Vitamin D daily Vitamin D has been called in to pharmacy and you have been given prescriptions for fentanyl patches. You have been given information sheets on diarrhea and constipation.  If you have any questions please give our office a call.   Follow up in 4 weeks for labs, Xgeva and follow up appointment.  Thank you for choosing [place] at [facility] to provide your oncology and hematology care.  To afford each patient quality time with our provider, please arrive at least 15 minutes before your scheduled appointment time.    If you have a lab appointment with the [place] please come in thru the  Main Entrance and check in at the main information desk  You need to re-schedule your appointment should you arrive 10 or more minutes late.  We strive to give you quality time with our providers, and arriving late affects you and other patients whose appointments are after yours.  Also, if you no show three or more times for appointments you may be dismissed from the clinic at the providers discretion.     Again, thank you for choosing [facility].  Our hope is that these requests will decrease the amount of time that you wait before being seen by our physicians.       _____________________________________________________________  Should you have questions after your visit to [facility], please contact our office at [phone] between the hours of 8:30 a.m. and 4:30  p.m.  Voicemails left after 4:30 p.m. will not be returned until the following business day.  For prescription refill requests, have your pharmacy contact our office.       Resources For Cancer Patients and their Caregivers ? American Cancer Society: Can assist with transportation, wigs, general needs, runs Look Good Feel Better.        629-113-0362 ? Cancer Care: Provides financial assistance, online support groups, medication/co-pay assistance.  1-800-813-HOPE 717-536-1272) ? [place] Assists [place] cancer patients and their families through emotional , educational and financial support.  716 170 4294 ? [place] DSS Where to apply for food stamps, Medicaid and utility assistance. 782-284-1214 ? RCATS: Transportation to medical appointments. (213) 053-7794 ? Social Security Administration: May apply for disability if have a Stage IV cancer. 3056061909 843-407-7826 ? [employer], Disability and Transit Services: Assists with nutrition, care and transit needs. [place] Support Programs: @10RELATIVEDAYS @ > Cancer Support Group  2nd Tuesday of the month 1pm-2pm, Journey Room  > Creative Journey  3rd Tuesday of the month 1130am-1pm, Journey Room  > Look Good Feel Better  1st Wednesday of the month 10am-12 noon, Journey Room (Call [place] to register [phone])    Diarrhea, Adult Introduction Diarrhea is when you have loose and water poop (stool) often. Diarrhea can make you feel weak and cause you to get dehydrated. Dehydration can make you tired and thirsty, make you have a dry mouth, and make it so you pee (urinate) less  often. Diarrhea often lasts 2-3 days. However, it can last longer if it is a sign of something more serious. It is important to treat your diarrhea as told by your doctor. Follow these instructions at home: Eating and drinking Follow these recommendations as told by your  doctor:  Take an oral rehydration solution (ORS). This is a drink that is sold at pharmacies and stores.  Drink clear fluids, such as:  Water.  Ice chips.  Diluted fruit juice.  Low-calorie sports drinks.  Eat bland, easy-to-digest foods in small amounts as you are able. These foods include:  Bananas.  Applesauce.  Rice.  Low-fat (lean) meats.  Toast.  Crackers.  Avoid drinking fluids that have a lot of sugar or caffeine in them.  Avoid alcohol.  Avoid spicy or fatty foods. General instructions  Drink enough fluid to keep your pee (urine) clear or pale yellow.  Wash your hands often. If you cannot use soap and water, use hand sanitizer.  Make sure that all people in your home wash their hands well and often.  Take over-the-counter and prescription medicines only as told by your doctor.  Rest at home while you get better.  Watch your condition for any changes.  Take a warm bath to help with any burning or pain from having diarrhea.  Keep all follow-up visits as told by your doctor. This is important. Contact a doctor if:  You have a fever.  Your diarrhea gets worse.  You have new symptoms.  You cannot keep fluids down.  You feel light-headed or dizzy.  You have a headache.  You have muscle cramps. Get help right away if:  You have chest pain.  You feel very weak or you pass out (faint).  You have bloody or black poop or poop that look like tar.  You have very bad pain, cramping, or bloating in your belly (abdomen).  You have trouble breathing or you are breathing very quickly.  Your heart is beating very quickly.  Your skin feels cold and clammy.  You feel confused.  You have signs of dehydration, such as:  Dark pee, hardly any pee, or no pee.  Cracked lips.  Dry mouth.  Sunken eyes.  Sleepiness.  Weakness. This information is not intended to replace advice given to you by your health care provider. Make sure you discuss  any questions you have with your health care provider. Document Released: 01/07/2008 Document Revised: 02/08/2016 Document Reviewed: 03/27/2015  2017 Elsevier    Constipation, Adult Constipation is when a person:  Poops (has a bowel movement) fewer times in a week than normal.  Has a hard time pooping.  Has poop that is dry, hard, or bigger than normal. Follow these instructions at home: Eating and drinking  Eat foods that have a lot of fiber, such as:  Fresh fruits and vegetables.  Whole grains.  Beans.  Eat less of foods that are high in fat, low in fiber, or overly processed, such as:  [place] fries.  Hamburgers.  Cookies.  Candy.  Soda.  Drink enough fluid to keep your pee (urine) clear or pale yellow. General instructions  Exercise regularly or as told by your doctor.  Go to the restroom when you feel like you need to poop. Do not hold it in.  Take over-the-counter and prescription medicines only as told by your doctor. These include any fiber supplements.  Do pelvic floor retraining exercises, such as:  Doing deep breathing while relaxing your lower belly (  abdomen).  Relaxing your pelvic floor while pooping.  Watch your condition for any changes.  Keep all follow-up visits as told by your doctor. This is important. Contact a doctor if:  You have pain that gets worse.  You have a fever.  You have not pooped for 4 days.  You throw up (vomit).  You are not hungry.  You lose weight.  You are bleeding from the anus.  You have thin, pencil-like poop (stool). Get help right away if:  You have a fever, and your symptoms suddenly get worse.  You leak poop or have blood in your poop.  Your belly feels hard or bigger than normal (is bloated).  You have very bad belly pain.  You feel dizzy or you faint. This information is not intended to replace advice given to you by your health care provider. Make sure you discuss any questions you have  with your health care provider. Document Released: 01/07/2008 Document Revised: 02/08/2016 Document Reviewed: 01/09/2016 Elsevier Interactive Patient Education  2017 [employer].

## 2016-08-01 NOTE — Assessment & Plan Note (Signed)
Stage IV prostate cancer with osseous involvement, S/P 6 cycles of Taxotere/Prednisone (01/07/2016- 04/11/2016), and [name] monthly beginning on 12/28/2015.  Now S/P palliative XRT to osseous lesion(s).  Oncology history updated.  Labs today: CBC diff, CMET, PSA, Testosterone.  I personally reviewed and went over laboratory results with the patient.  The results are noted within this dictation.  Due to his hypotension today, I will give 1 L NS.  He is given both Imodium sheet and constipation sheet for his alternating diarrhea and constipation.  His fatigue will improve and is likely secondary to XRT.  Due to his hypocalcemia, XGEVA will be HELD today.  We will try again next month.  He is advised to continue with his Ca++ and Vit D.  I have also asked him to take 2 TUMS per day to help with his low calcium.  For his GERD, he can take Maalox/Mylanta since he is having difficulty with pills.  I gave him 2 samples for Nexium too.  He will get [name] today and again next month.  In Feb 2018, he will start Depo-[name] every 3 months.  Supportive therapy plan is manipulated and built accordingly.  Labs in 1 month: CBC diff, CMET, PSA.  I have refilled his Fentanyl patches and escribed a refill on Vit D.  His daughter requested some information regarding Depo-[name].  I provided her some patient information from up-to-date.  Return next month for follow-up appointment, labs, Firmagon injection, and [name] (labs permitting)

## 2016-08-01 NOTE — Progress Notes (Signed)
Matthew Kilts, MD [place] [id number]  Prostate carcinoma [facility]) - Plan: fentaNYL (DURAGESIC - DOSED MCG/HR) 25 MCG/HR patch, fentaNYL (DURAGESIC - DOSED MCG/HR) 50 MCG/HR, ergocalciferol (VITAMIN D2) 50000 units capsule  Dehydration - Plan: DISCONTINUED: 0.9 %  sodium chloride infusion  Bone metastases ([facility]) - Plan: fentaNYL (DURAGESIC - DOSED MCG/HR) 25 MCG/HR patch, fentaNYL (DURAGESIC - DOSED MCG/HR) 50 MCG/HR, ergocalciferol (VITAMIN D2) 50000 units capsule  CURRENT THERAPY: [name] + [name]  INTERVAL HISTORY: Matthew [name] 60 y.o. male returns for followup of Stage IV prostate cancer with osseous involvement, S/P 6 cycles of Taxotere/Prednisone (01/07/2016- 04/11/2016), and [name] monthly beginning on 12/28/2015.  Now S/P palliative XRT to osseous lesion(s)    Prostate carcinoma ([place])   12/10/2015 Imaging    CTA chest, multiple thoracic and rib osseous lesions. no primary evident. Atherosclerosis including aortic and CAD      12/12/2015 Imaging    CT abdomen/pelvis with sclerotic osseous lesions througout the lumbar spine, sacrum, bilateral iliac bones and L proximal femur worrisome for sclerotic osseous mets,mild prostatomegaly, no LAD, Infrarenal 3.8 cm AAA      12/12/2015 Tumor Marker    PSA 153.85      12/18/2015 Imaging    Bone Scan Diffuse bony metastatic disease, posterior calvarium, sternum, thoracic, lumbar spine, bilateral ribs, L shoulder, bilateral bony pelvis, proximal L femur      12/24/2015 Initial Biopsy    CT biopsy of L iliac bone lesion      12/26/2015 Pathology Results    Bone, biopsy, left iliac - POSITIVE FOR METASTATIC ADENOCARCINOMA.      12/28/2015 -  Chemotherapy    Firmagon instituted 240 mg       01/01/2016 Procedure    Port placed by IR.      01/07/2016 - 04/11/2016 Chemotherapy    Docetaxel every 21 days x 6 cycles with dose reductions due to tolerance      02/18/2016 Adverse Reaction    GI toxicity from  docetaxel, dose reduced to 60 mg/m2      02/18/2016 Treatment Plan Change    Docetaxel dose reduced by 20%      03/06/2016 Procedure    Dr. Enrique Sack- Multiple extraction of tooth numbers 3, 5, 6, 8, 9, 10, 11, 12, 20, 21, 22, 27, 28, and 31. 4 Quadrants of alveoloplasty. Bilateral mandibular lingual tori reductions.      04/09/2016 Imaging    Brain MRI 1. No intracranial metastatic disease 2. No acute intracranial abnormality 3. Findings of chronic microvascular disease      05/14/2016 Imaging    CT C/A/P Stable appearing diffuse bony metastatic disease. No new significant disease is seen Innumerable patchy sclerotic osseous metastases throughout the axial and proximal appendicular skeleton, stable in size and distribution, significantly increased in density in the interval, likely reflecting treatment effect. 2. No new or progressive metastatic disease in the chest, abdomen or pelvis. 3. Bilateral perifissural pulmonary nodules are stable and probably benign. 4. Aortic atherosclerosis. Infrarenal 4.3 cm abdominal aortic aneurysm, minimally increased in size. Recommend followup by ultrasound in 1 year.      06/02/2016 Tumor Marker    PSA 0.99 ng/ml       - 07/31/2016 Radiation Therapy     The patient saw No care team member to display for radiation treatment. This is the current list of radiation treatment: Radiation Treatments       Patient's record has no active or  historical radiation treatments documented.              He just finished XRT yesterday.  He notes that his XRT treatment course has been complicated by fatigue, diarrhea alternating with constipation, throat/chest pain.  He notes increased oral intake as a result of the chest/throat pain.  His BP is below baseline today and therefore I suspect an element of dehydration.  He has been out of his Vit D x 1 month.  I will refill this today.  He is continuing to take Ca++.    He needs a refill on his pain  medications.  He was given liquid Lidocaine by his radiation oncologist.    He reports GERD symptoms.    He reports 1 episode of blood in toilet water.  Review of Systems  Constitutional: Positive for malaise/fatigue. Negative for chills and fever.  HENT: Negative.   Respiratory: Negative.  Negative for cough.   Cardiovascular: Negative.  Negative for chest pain and leg swelling.  Gastrointestinal: Positive for constipation, diarrhea and heartburn. Negative for nausea and vomiting.  Genitourinary: Negative.   Musculoskeletal: Negative.   Skin: Negative.        Erythema at treatment site of chest  Neurological: Positive for weakness.  Endo/Heme/Allergies: Negative.   Psychiatric/Behavioral: Negative.     Past Medical History:  Diagnosis Date  . Arthritis    Left knee  . Bone metastases ([place]) 12/26/2015  . Cancer [facility])    Prostate  . DVT (deep venous thrombosis) ([place]) 2004  . Kidney stones    ~2002  . Prostate carcinoma ([place]) 12/26/2015    Past Surgical History:  Procedure Laterality Date  . CIRCUMCISION  30 years ago  . MULTIPLE EXTRACTIONS WITH ALVEOLOPLASTY N/A 03/06/2016   Procedure: Extraction of tooth #'s 3,5,6,8-12,20-22, 27, 28, and 31 with alveoloplasty and bilateral mandibular tori reductions;  Surgeon: Lenn Cal, DDS;  Location: [place];  Service: Oral Surgery;  Laterality: N/A;    Family History  Problem Relation Age of Onset  . Cancer Mother     Social History   Social History  . Marital status: Single    Spouse name: N/A  . Number of children: 2  . Years of education: N/A   Occupational History  . Construction/carpenter    Social History Main Topics  . Smoking status: Current Every Day Smoker    Packs/day: 1.00    Years: 50.00    Types: Cigarettes    Start date: 06/16/1965  . Smokeless tobacco: Former Systems developer    Types: Chew    Quit date: 08/04/2005  . Alcohol use No  . Drug use:     Types: Marijuana     Comment: THC for appetite  . Sexual  activity: Not Asked   Other Topics Concern  . None   Social History Narrative  . None     PHYSICAL EXAMINATION  ECOG PERFORMANCE STATUS: 1 - Symptomatic but completely ambulatory  Vitals:   08/01/16 1443  BP: (!) 89/65  Pulse: 89  Resp: 16  Temp: 98.6 F (37 C)    GENERAL:alert, no distress, cooperative, ill looking and accompanied by his daughter SKIN: skin color, texture, turgor are normal HEAD: Normocephalic, No masses, lesions, tenderness or abnormalities EYES: normal, Conjunctiva are pink and non-injected EARS: External ears normal OROPHARYNX:lips, buccal mucosa, and tongue normal, mucous membranes are moist and no thrush or posterior pharynx erythema  NECK: supple, trachea midline LYMPH:  no palpable lymphadenopathy BREAST:not examined LUNGS: clear to  auscultation and percussion HEART: regular rate & rhythm, no murmurs and no gallops ABDOMEN:abdomen soft and normal bowel sounds BACK: Back symmetric, no curvature. EXTREMITIES:less then 2 second capillary refill, no joint deformities, effusion, or inflammation, no skin discoloration, no cyanosis  NEURO: alert & oriented x 3 with fluent speech, no focal motor/sensory deficits, gait normal   LABORATORY DATA: CBC    Component Value Date/Time   WBC 4.2 08/01/2016 1328   RBC 4.64 08/01/2016 1328   HGB 13.9 08/01/2016 1328   HCT 41.7 08/01/2016 1328   PLT 116 (L) 08/01/2016 1328   MCV 89.9 08/01/2016 1328   MCH 30.0 08/01/2016 1328   MCHC 33.3 08/01/2016 1328   RDW 13.5 08/01/2016 1328   LYMPHSABS 0.5 (L) 08/01/2016 1328   MONOABS 0.2 08/01/2016 1328   EOSABS 0.2 08/01/2016 1328   BASOSABS 0.0 08/01/2016 1328      Chemistry      Component Value Date/Time   NA 135 08/01/2016 1328   K 4.0 08/01/2016 1328   CL 105 08/01/2016 1328   CO2 24 08/01/2016 1328   BUN 8 08/01/2016 1328   CREATININE 0.88 08/01/2016 1328      Component Value Date/Time   CALCIUM 7.8 (L) 08/01/2016 1328   ALKPHOS 61 08/01/2016  1328   AST 18 08/01/2016 1328   ALT 18 08/01/2016 1328   BILITOT 0.5 08/01/2016 1328        PENDING LABS:   RADIOGRAPHIC STUDIES:  No results found.   PATHOLOGY:    ASSESSMENT AND PLAN:  Prostate carcinoma ([place]) Stage IV prostate cancer with osseous involvement, S/P 6 cycles of Taxotere/Prednisone (01/07/2016- 04/11/2016), and [name] monthly beginning on 12/28/2015.  Now S/P palliative XRT to osseous lesion(s).  Oncology history updated.  Labs today: CBC diff, CMET, PSA, Testosterone.  I personally reviewed and went over laboratory results with the patient.  The results are noted within this dictation.  Due to his hypotension today, I will give 1 L NS.  He is given both Imodium sheet and constipation sheet for his alternating diarrhea and constipation.  His fatigue will improve and is likely secondary to XRT.  Due to his hypocalcemia, XGEVA will be HELD today.  We will try again next month.  He is advised to continue with his Ca++ and Vit D.  I have also asked him to take 2 TUMS per day to help with his low calcium.  For his GERD, he can take Maalox/Mylanta since he is having difficulty with pills.  I gave him 2 samples for Nexium too.  He will get [name] today and again next month.  In Feb 2018, he will start Depo-[name] every 3 months.  Supportive therapy plan is manipulated and built accordingly.  Labs in 1 month: CBC diff, CMET, PSA.  I have refilled his Fentanyl patches and escribed a refill on Vit D.  His daughter requested some information regarding Depo-[name].  I provided her some patient information from up-to-date.  Return next month for follow-up appointment, labs, Firmagon injection, and Xgeva (labs permitting)   ORDERS PLACED FOR THIS ENCOUNTER: No orders of the defined types were placed in this encounter.   MEDICATIONS PRESCRIBED THIS ENCOUNTER: Meds ordered this encounter  Medications  . fentaNYL (DURAGESIC - DOSED MCG/HR) 25 MCG/HR patch     Sig: Place 1 patch (25 mcg total) onto the skin every 3 (three) days. Add to 50 mcg patch for total of 75 mcg    Dispense:  10 patch  Refill:  0    Order Specific Question:   Supervising Provider    Answer:   [name] [id number]  . fentaNYL (DURAGESIC - DOSED MCG/HR) 50 MCG/HR    Sig: Place 1 patch (50 mcg total) onto the skin every 3 (three) days.    Dispense:  10 patch    Refill:  0    Order Specific Question:   Supervising Provider    Answer:   [name] [id number]  . ergocalciferol (VITAMIN D2) 50000 units capsule    Sig: Take 1 capsule (50,000 Units total) by mouth every Friday.    Dispense:  30 capsule    Refill:  2    Order Specific Question:   Supervising Provider    Answer:   [name] [id number]    THERAPY PLAN:  Continue [name] with transition to Depo-[name] every 3 months in Feb 2018.  Xgeva monthly.  All questions were answered. The patient knows to call the clinic with any problems, questions or concerns. We can certainly see the patient much sooner if necessary.  Patient and plan discussed with Dr. Ancil Linsey and she is in agreement with the aforementioned.   This note is electronically signed by: Doy Mince 08/01/2016 4:47 PM

## 2016-08-02 LAB — TESTOSTERONE, FREE: Testosterone, Free: 0.4 pg/mL — ABNORMAL LOW (ref 6.6–18.1)

## 2016-08-02 LAB — TESTOSTERONE: Testosterone: 12 ng/dL — ABNORMAL LOW (ref 264–916)

## 2016-08-07 LAB — TESTOSTERONE, % FREE: Testosterone-% Free: 0.8 % — ABNORMAL LOW (ref 0.2–0.7)

## 2016-08-25 ENCOUNTER — Other Ambulatory Visit: Payer: 59

## 2016-08-29 ENCOUNTER — Ambulatory Visit (HOSPITAL_COMMUNITY): Payer: Self-pay

## 2016-08-29 ENCOUNTER — Ambulatory Visit (HOSPITAL_COMMUNITY): Payer: Self-pay | Admitting: Oncology

## 2016-08-29 ENCOUNTER — Other Ambulatory Visit (HOSPITAL_COMMUNITY): Payer: Self-pay

## 2016-09-01 ENCOUNTER — Encounter (HOSPITAL_COMMUNITY): Payer: 59

## 2016-09-01 ENCOUNTER — Encounter (HOSPITAL_COMMUNITY): Payer: Self-pay | Admitting: Oncology

## 2016-09-01 ENCOUNTER — Encounter (HOSPITAL_COMMUNITY): Payer: 59 | Attending: Oncology | Admitting: Oncology

## 2016-09-01 ENCOUNTER — Encounter (HOSPITAL_BASED_OUTPATIENT_CLINIC_OR_DEPARTMENT_OTHER): Payer: 59

## 2016-09-01 VITALS — BP 116/67 | HR 91 | Temp 98.1°F | Resp 18 | Ht 73.0 in | Wt 234.5 lb

## 2016-09-01 DIAGNOSIS — R5383 Other fatigue: Secondary | ICD-10-CM | POA: Insufficient documentation

## 2016-09-01 DIAGNOSIS — Z9221 Personal history of antineoplastic chemotherapy: Secondary | ICD-10-CM | POA: Insufficient documentation

## 2016-09-01 DIAGNOSIS — C61 Malignant neoplasm of prostate: Secondary | ICD-10-CM | POA: Diagnosis present

## 2016-09-01 DIAGNOSIS — C7951 Secondary malignant neoplasm of bone: Secondary | ICD-10-CM

## 2016-09-01 DIAGNOSIS — F17210 Nicotine dependence, cigarettes, uncomplicated: Secondary | ICD-10-CM | POA: Insufficient documentation

## 2016-09-01 DIAGNOSIS — Z5111 Encounter for antineoplastic chemotherapy: Secondary | ICD-10-CM

## 2016-09-01 DIAGNOSIS — Z809 Family history of malignant neoplasm, unspecified: Secondary | ICD-10-CM | POA: Insufficient documentation

## 2016-09-01 DIAGNOSIS — Z923 Personal history of irradiation: Secondary | ICD-10-CM | POA: Diagnosis not present

## 2016-09-01 DIAGNOSIS — I2510 Atherosclerotic heart disease of native coronary artery without angina pectoris: Secondary | ICD-10-CM | POA: Diagnosis not present

## 2016-09-01 DIAGNOSIS — Z87442 Personal history of urinary calculi: Secondary | ICD-10-CM | POA: Insufficient documentation

## 2016-09-01 LAB — CBC WITH DIFFERENTIAL/PLATELET
BASOS ABS: 0 10*3/uL (ref 0.0–0.1)
BASOS PCT: 1 %
Eosinophils Absolute: 0.2 10*3/uL (ref 0.0–0.7)
Eosinophils Relative: 4 %
HCT: 39.8 % (ref 39.0–52.0)
HEMOGLOBIN: 13.4 g/dL (ref 13.0–17.0)
LYMPHS PCT: 14 %
Lymphs Abs: 0.6 10*3/uL — ABNORMAL LOW (ref 0.7–4.0)
MCH: 29.8 pg (ref 26.0–34.0)
MCHC: 33.7 g/dL (ref 30.0–36.0)
MCV: 88.6 fL (ref 78.0–100.0)
MONO ABS: 0.3 10*3/uL (ref 0.1–1.0)
MONOS PCT: 7 %
NEUTROS ABS: 3.2 10*3/uL (ref 1.7–7.7)
NEUTROS PCT: 74 %
Platelets: 153 10*3/uL (ref 150–400)
RBC: 4.49 MIL/uL (ref 4.22–5.81)
RDW: 14.1 % (ref 11.5–15.5)
WBC: 4.2 10*3/uL (ref 4.0–10.5)

## 2016-09-01 LAB — COMPREHENSIVE METABOLIC PANEL
ALBUMIN: 3.6 g/dL (ref 3.5–5.0)
ALK PHOS: 69 U/L (ref 38–126)
ALT: 18 U/L (ref 17–63)
AST: 23 U/L (ref 15–41)
Anion gap: 8 (ref 5–15)
BILIRUBIN TOTAL: 0.4 mg/dL (ref 0.3–1.2)
BUN: 9 mg/dL (ref 6–20)
CO2: 24 mmol/L (ref 22–32)
CREATININE: 1.08 mg/dL (ref 0.61–1.24)
Calcium: 8 mg/dL — ABNORMAL LOW (ref 8.9–10.3)
Chloride: 105 mmol/L (ref 101–111)
GFR calc Af Amer: >60 mL/min (ref >60)
Glucose, Bld: 172 mg/dL — ABNORMAL HIGH (ref 65–99)
Potassium: 3.8 mmol/L (ref 3.5–5.1)
Sodium: 137 mmol/L (ref 135–145)
Total Protein: 6.6 g/dL (ref 6.5–8.1)

## 2016-09-01 LAB — PSA: PSA: 0.43 ng/mL (ref 0.00–4.00)

## 2016-09-01 MED ORDER — FENTANYL 50 MCG/HR TD PT72: 50.0000 ug | MEDICATED_PATCH | TRANSDERMAL | 0 refills | Status: DC

## 2016-09-01 MED ORDER — HYDROCODONE-ACETAMINOPHEN 10-325 MG PO TABS: 1.0000 | ORAL_TABLET | ORAL | 0 refills | Status: DC | PRN

## 2016-09-01 MED ORDER — FENTANYL 25 MCG/HR TD PT72: 25.0000 ug | MEDICATED_PATCH | TRANSDERMAL | 0 refills | Status: DC

## 2016-09-01 MED ORDER — DEGARELIX ACETATE 80 MG ~~LOC~~ SOLR
80.0000 mg | Freq: Once | SUBCUTANEOUS | Status: AC
  Administered 2016-09-01: 80 mg via SUBCUTANEOUS
  Filled 2016-09-01: qty 4

## 2016-09-01 NOTE — Progress Notes (Signed)
Matthew Kilts, MD [name] [id number]  Prostate carcinoma [facility] - Plan: fentaNYL (DURAGESIC - DOSED MCG/HR) 25 MCG/HR patch, fentaNYL (DURAGESIC - DOSED MCG/HR) 50 MCG/HR, HYDROcodone-acetaminophen ([place]) 10-325 MG tablet, CBC with Differential, Comprehensive metabolic panel, PSA, Testosterone  Bone metastases ([name]) - Plan: fentaNYL (DURAGESIC - DOSED MCG/HR) 25 MCG/HR patch, fentaNYL (DURAGESIC - DOSED MCG/HR) 50 MCG/HR, HYDROcodone-acetaminophen (NORCO) 10-325 MG tablet  CURRENT THERAPY: Matthew [name] + Matthew [name]  INTERVAL HISTORY: Matthew [name] 61 y.o. male returns for followup of Stage IV prostate cancer with osseous involvement, S/P 6 cycles of Taxotere/Prednisone (01/07/2016- 04/11/2016), and Matthew [name] monthly beginning on 12/28/2015.  Now S/P palliative XRT to osseous lesion(s)    Prostate carcinoma ([place])   12/10/2015 Imaging    CTA chest, multiple thoracic and rib osseous lesions. no primary evident. Atherosclerosis including aortic and CAD      12/12/2015 Imaging    CT abdomen/pelvis with sclerotic osseous lesions througout the lumbar spine, sacrum, bilateral iliac bones and L proximal femur worrisome for sclerotic osseous mets,mild prostatomegaly, no LAD, Infrarenal 3.8 cm AAA      12/12/2015 Tumor Marker    PSA 153.85      12/18/2015 Imaging    Bone Scan Diffuse bony metastatic disease, posterior calvarium, sternum, thoracic, lumbar spine, bilateral ribs, L shoulder, bilateral bony pelvis, proximal L femur      12/24/2015 Initial Biopsy    CT biopsy of L iliac bone lesion      12/26/2015 Pathology Results    Bone, biopsy, left iliac - POSITIVE FOR METASTATIC ADENOCARCINOMA.      12/28/2015 - 09/01/2016 Chemotherapy    Firmagon instituted 240 mg       01/01/2016 Procedure    Port placed by IR.      01/07/2016 - 04/11/2016 Chemotherapy    Docetaxel every 21 days x 6 cycles with dose reductions due to tolerance      02/18/2016 Adverse Reaction      GI toxicity from docetaxel, dose reduced to 60 mg/m2      02/18/2016 Treatment Plan Change    Docetaxel dose reduced by 20%      03/06/2016 Procedure    Dr. Enrique [name]- Multiple extraction of tooth numbers 3, 5, 6, 8, 9, 10, 11, 12, 20, 21, 22, 27, 28, and 31. 4 Quadrants of alveoloplasty. Bilateral mandibular lingual tori reductions.      04/09/2016 Imaging    Brain MRI 1. No intracranial metastatic disease 2. No acute intracranial abnormality 3. Findings of chronic microvascular disease      05/14/2016 Imaging    CT C/A/P Stable appearing diffuse bony metastatic disease. No new significant disease is seen Innumerable patchy sclerotic osseous metastases throughout the axial and proximal appendicular skeleton, stable in size and distribution, significantly increased in density in the interval, likely reflecting treatment effect. 2. No new or progressive metastatic disease in the chest, abdomen or pelvis. 3. Bilateral perifissural pulmonary nodules are stable and probably benign. 4. Aortic atherosclerosis. Infrarenal 4.3 cm abdominal aortic aneurysm, minimally increased in size. Recommend followup by ultrasound in 1 year.      06/02/2016 Tumor Marker    PSA 0.99 ng/ml       - 07/31/2016 Radiation Therapy    Palliative XRT by Dr. Lianne [name] in [place].       Chemotherapy    Depo-[name] every 3 months, beginning in Feb 2018       He has recovered from XRT therapy.  He  notes that his chest pain is much improved.  He is on Fentanyl and Hydrocodone.  He has tried to taper his Fentanyl down to 50 mcg, but due to his pain, he went back to 75 mcg.   He notes that he has been out of his Hydrocodone for 2 days.  He notes a left lateral thorax pain with extension of his left arm/shoulder.  He notes that it is a cramping pain.   It resolves spontaneously, but is lasts for hours.  He notes mild point tenderness to palpation.  His daughter reports depression, but the patient denies.  He is  not interested in evaluation/management of this issue.  She notes that he spends a lot of time in the house.  He is less active.  He reports that it is secondary to pain, but he reports that his pain is overall improved.  He is adamant that he is not depressed or that depression limits his activity level.  I provided him some education regarding depression and its management.  Review of Systems  Constitutional: Positive for malaise/fatigue. Negative for chills and fever.  HENT: Negative.   Respiratory: Negative.  Negative for cough.   Cardiovascular: Negative.  Negative for chest pain and leg swelling.  Gastrointestinal: Positive for heartburn. Negative for constipation, diarrhea, nausea and vomiting.  Genitourinary: Negative.   Musculoskeletal: Negative.   Skin: Negative.   Endo/Heme/Allergies: Negative.   Psychiatric/Behavioral: Negative.     Past Medical History:  Diagnosis Date  . Arthritis    Left knee  . Bone metastases ([place]) 12/26/2015  . Cancer [facility])    Prostate  . DVT (deep venous thrombosis) ([place]) 2004  . Kidney stones    ~2002  . Prostate carcinoma ([place]) 12/26/2015    Past Surgical History:  Procedure Laterality Date  . CIRCUMCISION  30 years ago  . MULTIPLE EXTRACTIONS WITH ALVEOLOPLASTY N/A 03/06/2016   Procedure: Extraction of tooth #'s 3,5,6,8-12,20-22, 27, 28, and 31 with alveoloplasty and bilateral mandibular tori reductions;  Surgeon: Lenn Cal, DDS;  Location: [place];  Service: Oral Surgery;  Laterality: N/A;    Family History  Problem Relation Age of Onset  . Cancer Mother     Social History   Social History  . Marital status: Single    Spouse name: N/A  . Number of children: 2  . Years of education: N/A   Occupational History  . Construction/carpenter    Social History Main Topics  . Smoking status: Current Every Day Smoker    Packs/day: 1.00    Years: 50.00    Types: Cigarettes    Start date: 06/16/1965  . Smokeless tobacco: Former  Systems developer    Types: Chew    Quit date: 08/04/2005  . Alcohol use No  . Drug use: Yes    Types: Marijuana     Comment: THC for appetite  . Sexual activity: Not Asked   Other Topics Concern  . None   Social History Narrative  . None     PHYSICAL EXAMINATION  ECOG PERFORMANCE STATUS: 1 - Symptomatic but completely ambulatory  Vitals:   09/01/16 1049  BP: 116/67  Pulse: 91  Resp: 18  Temp: 98.1 F (36.7 C)    GENERAL:alert, no distress, cooperative, and accompanied by his daughter SKIN: skin color, texture, turgor are normal HEAD: Normocephalic, No masses, lesions, tenderness or abnormalities EYES: normal, Conjunctiva are pink and non-injected EARS: External ears normal OROPHARYNX:lips, buccal mucosa, and tongue normal, mucous membranes are moist and  no thrush or posterior pharynx erythema  NECK: supple, trachea midline LYMPH:  no palpable lymphadenopathy BREAST:not examined LUNGS: clear to auscultation and percussion HEART: regular rate & rhythm, no murmurs and no gallops ABDOMEN:abdomen soft and normal bowel sounds BACK: Back symmetric, no curvature. THORAX: left lateral point tenderness overlying #6/#7 rib at anterior axillary line. EXTREMITIES:less then 2 second capillary refill, no joint deformities, effusion, or inflammation, no skin discoloration, no cyanosis  NEURO: alert & oriented x 3 with fluent speech, no focal motor/sensory deficits, gait normal   LABORATORY DATA: CBC    Component Value Date/Time   WBC 4.2 09/01/2016 1008   RBC 4.49 09/01/2016 1008   HGB 13.4 09/01/2016 1008   HCT 39.8 09/01/2016 1008   PLT 153 09/01/2016 1008   MCV 88.6 09/01/2016 1008   MCH 29.8 09/01/2016 1008   MCHC 33.7 09/01/2016 1008   RDW 14.1 09/01/2016 1008   LYMPHSABS 0.6 (L) 09/01/2016 1008   MONOABS 0.3 09/01/2016 1008   EOSABS 0.2 09/01/2016 1008   BASOSABS 0.0 09/01/2016 1008      Chemistry      Component Value Date/Time   NA 137 09/01/2016 1008   K 3.8 09/01/2016  1008   CL 105 09/01/2016 1008   CO2 24 09/01/2016 1008   BUN 9 09/01/2016 1008   CREATININE 1.08 09/01/2016 1008      Component Value Date/Time   CALCIUM 8.0 (L) 09/01/2016 1008   ALKPHOS 69 09/01/2016 1008   AST 23 09/01/2016 1008   ALT 18 09/01/2016 1008   BILITOT 0.4 09/01/2016 1008        PENDING LABS:   RADIOGRAPHIC STUDIES:  No results found.   PATHOLOGY:    ASSESSMENT AND PLAN:  Prostate carcinoma ([place]) Stage IV prostate cancer with osseous involvement, S/P 6 cycles of Taxotere/Prednisone (01/07/2016- 04/11/2016), and Matthew [name] monthly beginning on 12/28/2015.  Now S/P palliative XRT to osseous lesion(s).  Oncology history updated.  Labs today: CBC diff, CMET, PSA.  I personally reviewed and went over laboratory results with the patient.  The results are noted within this dictation.  Labs in 4 weeks: CBC diff, CMET, PSA, Testosterone.  His fatigue will improve.  Chest pain from XRT is also improved.  I have refilled his Fentanyl patches and Hydrocodone.  He notes that he has tried to decrease his Fentanyl dose, but this caused significant increase in pain.  Therefore, he went back to 75 mcg/hr.  Due to his hypocalcemia, XGEVA will be HELD today.  We will try again next month.  He is advised to continue with his Ca++ and Vit D.  I have also asked him to take 4 TUMS per day to help with his low calcium.  He will get [place] today.  Then, next month (Feb 2018), he will start Depo-[name] every 3 months.  Supportive therapy plan is manipulated and built accordingly.  We discussed his depressive symptoms, but the patient denies that he has depression and therefore, no intervention is instituted at this time.  Return next month for follow-up appointment, labs, Depo-[name] injection, and Xgeva (labs permitting).  He has follow-up with Dr. Lianne [name] (XRT).   ORDERS PLACED FOR THIS ENCOUNTER: Orders Placed This Encounter  Procedures  . CBC with Differential  .  Comprehensive metabolic panel  . PSA  . Testosterone    MEDICATIONS PRESCRIBED THIS ENCOUNTER: Meds ordered this encounter  Medications  . fentaNYL (DURAGESIC - DOSED MCG/HR) 25 MCG/HR patch    Sig: Place 1 patch (25 mcg  total) onto the skin every 3 (three) days. Add to 50 mcg patch for total of 75 mcg    Dispense:  10 patch    Refill:  0    Order Specific Question:   Supervising Provider    Answer:   Brunetta Genera [id number]  . fentaNYL (DURAGESIC - DOSED MCG/HR) 50 MCG/HR    Sig: Place 1 patch (50 mcg total) onto the skin every 3 (three) days.    Dispense:  10 patch    Refill:  0    Order Specific Question:   Supervising Provider    Answer:   Brunetta Genera [id number]  . HYDROcodone-acetaminophen (NORCO) 10-325 MG tablet    Sig: Take 1 tablet by mouth every 4 (four) hours as needed.    Dispense:  60 tablet    Refill:  0    Order Specific Question:   Supervising Provider    Answer:   Brunetta Genera [id number]    THERAPY PLAN:  Continue Matthew [name] with transition to Depo-[name] every 3 months in Feb 2018.  Xgeva monthly.  All questions were answered. The patient knows to call the clinic with any problems, questions or concerns. We can certainly [name] the patient much sooner if necessary.  Patient and plan discussed with Dr. Ancil Linsey and she is in agreement with the aforementioned.   This note is electronically signed by: Doy Mince 09/01/2016 11:42 AM

## 2016-09-01 NOTE — Assessment & Plan Note (Addendum)
Stage IV prostate cancer with osseous involvement, S/P 6 cycles of Taxotere/Prednisone (01/07/2016- 04/11/2016), and [name] monthly beginning on 12/28/2015.  Now S/P palliative XRT to osseous lesion(s).  Oncology history updated.  Labs today: CBC diff, CMET, PSA.  I personally reviewed and went over laboratory results with the patient.  The results are noted within this dictation.  Labs in 4 weeks: CBC diff, CMET, PSA, Testosterone.  His fatigue will improve.  Chest pain from XRT is also improved.  I have refilled his Fentanyl patches and Hydrocodone.  He notes that he has tried to decrease his Fentanyl dose, but this caused significant increase in pain.  Therefore, he went back to 75 mcg/hr.  Due to his hypocalcemia, XGEVA will be HELD today.  We will try again next month.  He is advised to continue with his Ca++ and Vit D.  I have also asked him to take 4 TUMS per day to help with his low calcium.  He will get [place] today.  Then, next month (Feb 2018), he will start Depo-[name] every 3 months.  Supportive therapy plan is manipulated and built accordingly.  We discussed his depressive symptoms, but the patient denies that he has depression and therefore, no intervention is instituted at this time.  Return next month for follow-up appointment, labs, Depo-[name] injection, and Xgeva (labs permitting).  He has follow-up with Dr. Lianne Cure (XRT).

## 2016-09-01 NOTE — Patient Instructions (Addendum)
[place] at [facility] Discharge Instructions  RECOMMENDATIONS MADE BY THE CONSULTANT AND ANY TEST RESULTS WILL BE SENT TO YOUR REFERRING PHYSICIAN.  You were seen today by Kirby Crigler PA-C. Firmagon today, hold xgeva. Continue taking calcium, add 4 TUMS daily. Vit D should still have refills. Return in 4 weeks for Xgeva and DEPO [name] injections, then every 3 months. Return in 4 weeks for follow up and labs.    Thank you for choosing [place] at [facility] to provide your oncology and hematology care.  To afford each patient quality time with our provider, please arrive at least 15 minutes before your scheduled appointment time.    If you have a lab appointment with the [facility] please come in thru the  Main Entrance and check in at the main information desk  You need to re-schedule your appointment should you arrive 10 or more minutes late.  We strive to give you quality time with our providers, and arriving late affects you and other patients whose appointments are after yours.  Also, if you no show three or more times for appointments you may be dismissed from the clinic at the providers discretion.     Again, thank you for choosing [facility].  Our hope is that these requests will decrease the amount of time that you wait before being seen by our physicians.       _____________________________________________________________  Should you have questions after your visit to [facility], please contact our office at [phone] between the hours of 8:30 a.m. and 4:30 p.m.  Voicemails left after 4:30 p.m. will not be returned until the following business day.  For prescription refill requests, have your pharmacy contact our office.       Resources For Cancer Patients and their Caregivers ? American Cancer Society: Can assist with transportation, wigs, general needs, runs Look Good Feel Better.         (412)780-1420 ? Cancer Care: Provides financial assistance, online support groups, medication/co-pay assistance.  1-800-813-HOPE (732)486-6872) ? [place] Assists [place] cancer patients and their families through emotional , educational and financial support.  239-252-5963 ? [place] DSS Where to apply for food stamps, Medicaid and utility assistance. 279-681-1577 ? RCATS: Transportation to medical appointments. 581-559-8551 ? Social Security Administration: May apply for disability if have a Stage IV cancer. ([phone] 431-237-8744 ? [employer], Disability and Transit Services: Assists with nutrition, care and transit needs. [place] Support Programs: @10RELATIVEDAYS @ > Cancer Support Group  2nd Tuesday of the month 1pm-2pm, Journey Room  > Creative Journey  3rd Tuesday of the month 1130am-1pm, Journey Room  > Look Good Feel Better  1st Wednesday of the month 10am-12 noon, Journey Room (Call [place] to register [phone])

## 2016-09-01 NOTE — Patient Instructions (Signed)
[facility] Discharge Instructions for Patients Receiving Chemotherapy   Beginning January 23rd 2017 lab work for the [facility] will be done in the  Main lab at [facility] on 1st floor. If you have a lab appointment with the [facility] please come in thru the  Main Entrance and check in at the main information desk   Today you received the following chemotherapy agents Firmagon injection. Follow-up as scheduled. Call clinic for any questions or concerns  To help prevent nausea and vomiting after your treatment, we encourage you to take your nausea medication   If you develop nausea and vomiting, or diarrhea that is not controlled by your medication, call the clinic.  The clinic phone number is [phone]. Office hours are Monday-Friday 8:30am-5:00pm.  BELOW ARE SYMPTOMS THAT SHOULD BE REPORTED IMMEDIATELY:  *FEVER GREATER THAN 101.0 F  *CHILLS WITH OR WITHOUT FEVER  NAUSEA AND VOMITING THAT IS NOT CONTROLLED WITH YOUR NAUSEA MEDICATION  *UNUSUAL SHORTNESS OF BREATH  *UNUSUAL BRUISING OR BLEEDING  TENDERNESS IN MOUTH AND THROAT WITH OR WITHOUT PRESENCE OF ULCERS  *URINARY PROBLEMS  *BOWEL PROBLEMS  UNUSUAL RASH Items with * indicate a potential emergency and should be followed up as soon as possible. If you have an emergency after office hours please contact your primary care physician or go to the nearest emergency department.  Please call the clinic during office hours if you have any questions or concerns.   You may also contact the Patient Navigator at [phone] should you have any questions or need assistance in obtaining follow up care.      Resources For Cancer Patients and their Caregivers ? American Cancer Society: Can assist with transportation, wigs, general needs, runs Look Good Feel Better.        (938)286-6949 ? Cancer Care: Provides financial assistance, online support groups, medication/co-pay assistance.   1-800-813-HOPE (660)169-5414) ? [place] Assists [place] cancer patients and their families through emotional , educational and financial support.  519-244-8992 ? [place] DSS Where to apply for food stamps, Medicaid and utility assistance. 769-026-5270 ? RCATS: Transportation to medical appointments. 873 644 7598 ? Social Security Administration: May apply for disability if have a Stage IV cancer. 743-083-2557 250-445-6061 ? [employer], Disability and Transit Services: Assists with nutrition, care and transit needs. [phone]

## 2016-09-01 NOTE — Progress Notes (Signed)
Matthew [name] tolerated Firmagon injection well without complaints or incident. Firmagon 80 mg administered SQ in right abdomen. Pt discharged self ambulatory in satisfactory condition accompanied by family member

## 2016-09-26 ENCOUNTER — Ambulatory Visit (HOSPITAL_COMMUNITY): Payer: Self-pay

## 2016-09-26 ENCOUNTER — Ambulatory Visit (HOSPITAL_COMMUNITY): Payer: Self-pay | Admitting: Hematology & Oncology

## 2016-09-26 ENCOUNTER — Other Ambulatory Visit (HOSPITAL_COMMUNITY): Payer: Self-pay

## 2016-09-29 ENCOUNTER — Ambulatory Visit (HOSPITAL_COMMUNITY): Payer: Self-pay | Admitting: Adult Health

## 2016-09-29 ENCOUNTER — Ambulatory Visit (HOSPITAL_COMMUNITY): Payer: Self-pay

## 2016-09-29 ENCOUNTER — Encounter (HOSPITAL_COMMUNITY): Payer: Self-pay

## 2016-09-29 ENCOUNTER — Encounter (HOSPITAL_COMMUNITY): Payer: 59 | Attending: Oncology | Admitting: Oncology

## 2016-09-29 ENCOUNTER — Encounter (HOSPITAL_BASED_OUTPATIENT_CLINIC_OR_DEPARTMENT_OTHER): Payer: 59

## 2016-09-29 ENCOUNTER — Encounter (HOSPITAL_COMMUNITY): Payer: 59

## 2016-09-29 ENCOUNTER — Other Ambulatory Visit (HOSPITAL_COMMUNITY): Payer: Self-pay

## 2016-09-29 VITALS — BP 103/77 | HR 90 | Temp 98.2°F | Resp 20 | Wt 237.5 lb

## 2016-09-29 DIAGNOSIS — C61 Malignant neoplasm of prostate: Secondary | ICD-10-CM

## 2016-09-29 DIAGNOSIS — C7951 Secondary malignant neoplasm of bone: Secondary | ICD-10-CM | POA: Diagnosis not present

## 2016-09-29 DIAGNOSIS — Z87442 Personal history of urinary calculi: Secondary | ICD-10-CM | POA: Diagnosis not present

## 2016-09-29 DIAGNOSIS — F5101 Primary insomnia: Secondary | ICD-10-CM | POA: Diagnosis not present

## 2016-09-29 DIAGNOSIS — I2510 Atherosclerotic heart disease of native coronary artery without angina pectoris: Secondary | ICD-10-CM | POA: Diagnosis not present

## 2016-09-29 DIAGNOSIS — R5383 Other fatigue: Secondary | ICD-10-CM | POA: Diagnosis not present

## 2016-09-29 DIAGNOSIS — Z9221 Personal history of antineoplastic chemotherapy: Secondary | ICD-10-CM | POA: Diagnosis not present

## 2016-09-29 DIAGNOSIS — Z809 Family history of malignant neoplasm, unspecified: Secondary | ICD-10-CM | POA: Diagnosis not present

## 2016-09-29 DIAGNOSIS — Z923 Personal history of irradiation: Secondary | ICD-10-CM | POA: Insufficient documentation

## 2016-09-29 DIAGNOSIS — Z5111 Encounter for antineoplastic chemotherapy: Secondary | ICD-10-CM

## 2016-09-29 DIAGNOSIS — F17210 Nicotine dependence, cigarettes, uncomplicated: Secondary | ICD-10-CM | POA: Diagnosis not present

## 2016-09-29 LAB — COMPREHENSIVE METABOLIC PANEL
ALBUMIN: 3.9 g/dL (ref 3.5–5.0)
ALK PHOS: 65 U/L (ref 38–126)
ALT: 16 U/L — ABNORMAL LOW (ref 17–63)
ANION GAP: 9 (ref 5–15)
AST: 16 U/L (ref 15–41)
BILIRUBIN TOTAL: 0.2 mg/dL — AB (ref 0.3–1.2)
BUN: 15 mg/dL (ref 6–20)
CALCIUM: 9.3 mg/dL (ref 8.9–10.3)
CO2: 27 mmol/L (ref 22–32)
Chloride: 101 mmol/L (ref 101–111)
Creatinine, Ser: 1.02 mg/dL (ref 0.61–1.24)
GLUCOSE: 111 mg/dL — AB (ref 65–99)
POTASSIUM: 3.8 mmol/L (ref 3.5–5.1)
Sodium: 137 mmol/L (ref 135–145)
TOTAL PROTEIN: 6.9 g/dL (ref 6.5–8.1)

## 2016-09-29 LAB — CBC WITH DIFFERENTIAL/PLATELET
BASOS PCT: 1 %
Basophils Absolute: 0 10*3/uL (ref 0.0–0.1)
Eosinophils Absolute: 0.2 10*3/uL (ref 0.0–0.7)
Eosinophils Relative: 5 %
HEMATOCRIT: 41 % (ref 39.0–52.0)
Hemoglobin: 13.8 g/dL (ref 13.0–17.0)
Lymphocytes Relative: 17 %
Lymphs Abs: 0.7 10*3/uL (ref 0.7–4.0)
MCH: 30.1 pg (ref 26.0–34.0)
MCHC: 33.7 g/dL (ref 30.0–36.0)
MCV: 89.3 fL (ref 78.0–100.0)
MONOS PCT: 10 %
Monocytes Absolute: 0.4 10*3/uL (ref 0.1–1.0)
NEUTROS ABS: 2.6 10*3/uL (ref 1.7–7.7)
Neutrophils Relative %: 67 %
Platelets: 193 10*3/uL (ref 150–400)
RBC: 4.59 MIL/uL (ref 4.22–5.81)
RDW: 14.8 % (ref 11.5–15.5)
WBC: 4 10*3/uL (ref 4.0–10.5)

## 2016-09-29 LAB — PSA: PSA: 0.28 ng/mL (ref 0.00–4.00)

## 2016-09-29 MED ORDER — TEMAZEPAM 15 MG PO CAPS: 15.0000 mg | ORAL_CAPSULE | Freq: Every evening | ORAL | 0 refills | Status: DC | PRN

## 2016-09-29 MED ORDER — DENOSUMAB 120 MG/1.7ML ~~LOC~~ SOLN
120.0000 mg | Freq: Once | SUBCUTANEOUS | Status: AC
  Administered 2016-09-29: 120 mg via SUBCUTANEOUS
  Filled 2016-09-29: qty 1.7

## 2016-09-29 MED ORDER — FENTANYL 25 MCG/HR TD PT72: 25.0000 ug | MEDICATED_PATCH | TRANSDERMAL | 0 refills | Status: DC

## 2016-09-29 MED ORDER — FENTANYL 50 MCG/HR TD PT72: 50.0000 ug | MEDICATED_PATCH | TRANSDERMAL | 0 refills | Status: DC

## 2016-09-29 MED ORDER — LEUPROLIDE ACETATE (3 MONTH) 22.5 MG IM KIT
22.5000 mg | PACK | Freq: Once | INTRAMUSCULAR | Status: AC
  Administered 2016-09-29: 22.5 mg via INTRAMUSCULAR
  Filled 2016-09-29: qty 22.5

## 2016-09-29 MED ORDER — HYDROCODONE-ACETAMINOPHEN 10-325 MG PO TABS: 1.0000 | ORAL_TABLET | ORAL | 0 refills | Status: DC | PRN

## 2016-09-29 NOTE — Patient Instructions (Signed)
[place] at [facility] Discharge Instructions  RECOMMENDATIONS MADE BY THE CONSULTANT AND ANY TEST RESULTS WILL BE SENT TO YOUR REFERRING PHYSICIAN.  You were seen today by Dr. Twana First Continue monthly xgeva injections Follow up in 3 months with labs See Amy up front for appointments   Thank you for choosing [place] at [facility] to provide your oncology and hematology care.  To afford each patient quality time with our provider, please arrive at least 15 minutes before your scheduled appointment time.    If you have a lab appointment with the [facility] please come in thru the  Main Entrance and check in at the main information desk  You need to re-schedule your appointment should you arrive 10 or more minutes late.  We strive to give you quality time with our providers, and arriving late affects you and other patients whose appointments are after yours.  Also, if you no show three or more times for appointments you may be dismissed from the clinic at the providers discretion.     Again, thank you for choosing [facility].  Our hope is that these requests will decrease the amount of time that you wait before being seen by our physicians.       _____________________________________________________________  Should you have questions after your visit to [facility], please contact our office at [phone] between the hours of 8:30 a.m. and 4:30 p.m.  Voicemails left after 4:30 p.m. will not be returned until the following business day.  For prescription refill requests, have your pharmacy contact our office.       Resources For Cancer Patients and their Caregivers ? American Cancer Society: Can assist with transportation, wigs, general needs, runs Look Good Feel Better.        ([phone] ? Cancer Care: Provides financial assistance, online support groups, medication/co-pay assistance.   1-800-813-HOPE 417-611-0065) ? [place] Assists [place] cancer patients and their families through emotional , educational and financial support.  272-398-7264 ? [place] DSS Where to apply for food stamps, Medicaid and utility assistance. (830)307-2658 ? RCATS: Transportation to medical appointments. 9048422569 ? Social Security Administration: May apply for disability if have a Stage IV cancer. 505-626-9713 [phone] ? [employer], Disability and Transit Services: Assists with nutrition, care and transit needs. [place] Support Programs: @10RELATIVEDAYS @ > Cancer Support Group  2nd Tuesday of the month 1pm-2pm, Journey Room  > Creative Journey  3rd Tuesday of the month 1130am-1pm, Journey Room  > Look Good Feel Better  1st Wednesday of the month 10am-12 noon, Journey Room (Call [place] to register [phone])

## 2016-09-29 NOTE — Progress Notes (Signed)
Matthew Kilts, MD Matthew [name]  Prostate carcinoma [facility]) - Plan: fentaNYL (DURAGESIC - DOSED MCG/HR) 25 MCG/HR patch, fentaNYL (DURAGESIC - DOSED MCG/HR) 50 MCG/HR, HYDROcodone-acetaminophen (NORCO) 10-325 MG tablet  Bone metastases (HCC) - Plan: fentaNYL (DURAGESIC - DOSED MCG/HR) 25 MCG/HR patch, fentaNYL (DURAGESIC - DOSED MCG/HR) 50 MCG/HR, HYDROcodone-acetaminophen (NORCO) 10-325 MG tablet  Primary insomnia - Plan: temazepam (RESTORIL) 15 MG capsule  CURRENT THERAPY: Matthew [name] + Matthew [name]  INTERVAL HISTORY: Matthew [name] 61 y.o. male returns for followup of Stage IV prostate cancer with osseous involvement, S/P 6 cycles of Taxotere/Matthew [name] (01/07/2016- 04/11/2016), and Matthew [name] monthly beginning on 12/28/2015.  Now S/P palliative XRT to osseous lesion(s)    Prostate carcinoma ([place])   12/10/2015 Imaging    CTA chest, multiple thoracic and rib osseous lesions. no primary evident. Atherosclerosis including aortic and CAD      12/12/2015 Imaging    CT abdomen/pelvis with sclerotic osseous lesions througout the lumbar spine, sacrum, bilateral iliac bones and L proximal femur worrisome for sclerotic osseous mets,mild prostatomegaly, no LAD, Infrarenal 3.8 cm AAA      12/12/2015 Tumor Marker    PSA 153.85      12/18/2015 Imaging    Bone Scan Diffuse bony metastatic disease, posterior calvarium, sternum, thoracic, lumbar spine, bilateral ribs, L shoulder, bilateral bony pelvis, proximal L femur      12/24/2015 Initial Biopsy    CT biopsy of L iliac bone lesion      12/26/2015 Pathology Results    Bone, biopsy, left iliac - POSITIVE FOR METASTATIC ADENOCARCINOMA.      12/28/2015 - 09/01/2016 Chemotherapy    Firmagon instituted 240 mg       01/01/2016 Procedure    Port placed by IR.      01/07/2016 - 04/11/2016 Chemotherapy    Matthew [name] every 21 days x 6 cycles with dose reductions due to tolerance      02/18/2016 Adverse Reaction    GI  toxicity from Matthew [name], dose reduced to 60 mg/m2      02/18/2016 Treatment Plan Change    Matthew [name] dose reduced by 20%      03/06/2016 Procedure    Dr. Enrique [name]- Multiple extraction of tooth numbers 3, 5, 6, 8, 9, 10, 11, 12, 20, 21, 22, 27, 28, and 31. 4 Quadrants of alveoloplasty. Bilateral mandibular lingual tori reductions.      04/09/2016 Imaging    Brain MRI 1. No intracranial metastatic disease 2. No acute intracranial abnormality 3. Findings of chronic microvascular disease      05/14/2016 Imaging    CT C/A/P Stable appearing diffuse bony metastatic disease. No new significant disease is seen Innumerable patchy sclerotic osseous metastases throughout the axial and proximal appendicular skeleton, stable in size and distribution, significantly increased in density in the interval, likely reflecting treatment effect. 2. No new or progressive metastatic disease in the chest, abdomen or pelvis. 3. Bilateral perifissural pulmonary nodules are stable and probably benign. 4. Aortic atherosclerosis. Infrarenal 4.3 cm abdominal aortic aneurysm, minimally increased in size. Recommend followup by ultrasound in 1 year.      06/02/2016 Tumor Marker    PSA 0.99 ng/ml       - 07/31/2016 Radiation Therapy    Palliative XRT by Dr. Lianne [name] in [place].       Chemotherapy    Depo-[name] every 3 months, beginning in Feb 2018       The patient reports thoracic spine back pain,  8/10. He has run out of pain pills at this time. He reports he cannot sleep, which is unrelated to the back pain. He denies chest pain, SOB, abdominal pain. He reports his hip does not hurt as badly as it did before radiation. He has pain in unspecified knee ongoing. He reports he is currently taking 6 calcium pill supplements daily.   Review of Systems  HENT: Negative.   Eyes: Negative.   Respiratory: Negative for shortness of breath.   Cardiovascular: Negative.  Negative for chest pain.  Gastrointestinal:  Negative for abdominal pain.  Genitourinary: Negative.   Musculoskeletal: Positive for back pain (8/10 in severity to the thoracic spine.).  Skin: Negative.   Neurological: Negative.   Endo/Heme/Allergies: Negative.   Psychiatric/Behavioral: The patient has insomnia (Reports he may go 2-3 days without sleep.).   All other systems reviewed and are negative.   Past Medical History:  Diagnosis Date   Arthritis    Left knee   Bone metastases ([place]) 12/26/2015   Cancer ([place])    Prostate   DVT (deep venous thrombosis) ([place]) 2004   Kidney stones    ~2002   Prostate carcinoma ([place]) 12/26/2015    Past Surgical History:  Procedure Laterality Date   CIRCUMCISION  30 years ago   MULTIPLE EXTRACTIONS WITH ALVEOLOPLASTY N/A 03/06/2016   Procedure: Extraction of tooth #'s 3,5,6,8-12,20-22, 27, 28, and 31 with alveoloplasty and bilateral mandibular tori reductions;  Surgeon: Lenn Cal, DDS;  Location: [facility];  Service: Oral Surgery;  Laterality: N/A;    Family History  Problem Relation Age of Onset   Cancer Mother     Social History   Social History   Marital status: Single    Spouse name: N/A   Number of children: 2   Years of education: N/A   Occupational History   Construction/carpenter    Social History Main Topics   Smoking status: Current Every Day Smoker    Packs/day: 1.00    Years: 50.00    Types: Cigarettes    Start date: 06/16/1965   Smokeless tobacco: Former Systems developer    Types: Chew    Quit date: 08/04/2005   Alcohol use No   Drug use: Yes    Types: Marijuana     Comment: THC for appetite   Sexual activity: Not Asked   Other Topics Concern   None   Social History Narrative   None     PHYSICAL EXAMINATION  ECOG PERFORMANCE STATUS: 1 - Symptomatic but completely ambulatory  Vitals:   09/29/16 1502  BP: 103/77  Pulse: 90  Resp: 20  Temp: 98.2 F (36.8 C)    Physical Exam  Constitutional: He is oriented to person, place, and time  and well-developed, well-nourished, and in no distress.  HENT:  Head: Normocephalic and atraumatic.  Eyes: Conjunctivae and EOM are normal. Pupils are equal, round, and reactive to light.  Neck: Normal range of motion. Neck supple.  Cardiovascular: Normal rate, regular rhythm and normal heart sounds.   Pulmonary/Chest: Effort normal and breath sounds normal.  Abdominal: Soft. Bowel sounds are normal.  Musculoskeletal: Normal range of motion.  Neurological: He is alert and oriented to person, place, and time. Gait normal.  Skin: Skin is warm and dry.  Nursing note and vitals reviewed.    LABORATORY DATA: I have reviewed the data as follows. CBC    Component Value Date/Time   WBC 4.0 09/29/2016 1349   RBC 4.59 09/29/2016 1349   HGB 13.8  09/29/2016 1349   HCT 41.0 09/29/2016 1349   PLT 193 09/29/2016 1349   MCV 89.3 09/29/2016 1349   MCH 30.1 09/29/2016 1349   MCHC 33.7 09/29/2016 1349   RDW 14.8 09/29/2016 1349   LYMPHSABS 0.7 09/29/2016 1349   MONOABS 0.4 09/29/2016 1349   EOSABS 0.2 09/29/2016 1349   BASOSABS 0.0 09/29/2016 1349      Chemistry      Component Value Date/Time   NA 137 09/29/2016 1349   K 3.8 09/29/2016 1349   CL 101 09/29/2016 1349   CO2 27 09/29/2016 1349   BUN 15 09/29/2016 1349   CREATININE 1.02 09/29/2016 1349      Component Value Date/Time   CALCIUM 9.3 09/29/2016 1349   ALKPHOS 65 09/29/2016 1349   AST 16 09/29/2016 1349   ALT 16 (L) 09/29/2016 1349   BILITOT 0.2 (L) 09/29/2016 1349      PSA 09/01/16: 0.43  PENDING LABS:   RADIOGRAPHIC STUDIES: I have personally reviewed the radiological images as listed and agreed with the findings in the report. No results found.   PATHOLOGY:    ASSESSMENT AND PLAN:  PSA and testosterone today pending.  Rx temazepam 15mg  PRN for insomnia. RX hydrocodone, fentanyl.  Continue xgeva q4 weeks for bone mets.  Continue lupron q 3 months.  RTC in 3 months for follow up with PSA CMP CBC check  at that time.   ORDERS PLACED FOR THIS ENCOUNTER: No orders of the defined types were placed in this encounter.   MEDICATIONS PRESCRIBED THIS ENCOUNTER: Meds ordered this encounter  Medications   fentaNYL (DURAGESIC - DOSED MCG/HR) 25 MCG/HR patch    Sig: Place 1 patch (25 mcg total) onto the skin every 3 (three) days. Add to 50 mcg patch for total of 75 mcg    Dispense:  10 patch    Refill:  0   fentaNYL (DURAGESIC - DOSED MCG/HR) 50 MCG/HR    Sig: Place 1 patch (50 mcg total) onto the skin every 3 (three) days.    Dispense:  10 patch    Refill:  0   HYDROcodone-acetaminophen (NORCO) 10-325 MG tablet    Sig: Take 1 tablet by mouth every 4 (four) hours as needed.    Dispense:  60 tablet    Refill:  0   temazepam (RESTORIL) 15 MG capsule    Sig: Take 1 capsule (15 mg total) by mouth at bedtime as needed for sleep.    Dispense:  30 capsule    Refill:  0    THERAPY PLAN:  Continue Firmagon with transition to Depo-[name] every 3 months in Feb 2018.  Xgeva monthly.  All questions were answered. The patient knows to call the clinic with any problems, questions or concerns. We can certainly [name] the patient much sooner if necessary.  This document serves as a record of services personally performed by Twana First, MD. It was created on her behalf by Maryla Morrow, a trained medical scribe. The creation of this record is based on the scribe's personal observations and the provider's statements to them. This document has been checked and approved by the attending provider.  I have reviewed the above documentation for accuracy and completeness and I agree with the above.  This note is electronically signed by: Twana First, MD 09/29/2016 3:08 PM

## 2016-09-29 NOTE — Progress Notes (Signed)
Matthew [name] presents today for injection per the provider's orders.  Xgeva and [name] administration without incident; see MAR for injection details.  Patient tolerated procedure well and without incident.  No questions or complaints noted at this time.

## 2016-09-30 LAB — TESTOSTERONE: Testosterone: 12 ng/dL — ABNORMAL LOW (ref 264–916)

## 2016-10-24 ENCOUNTER — Other Ambulatory Visit (HOSPITAL_COMMUNITY): Payer: Self-pay

## 2016-10-24 DIAGNOSIS — C7951 Secondary malignant neoplasm of bone: Secondary | ICD-10-CM

## 2016-10-27 ENCOUNTER — Encounter (HOSPITAL_COMMUNITY): Payer: 59 | Attending: Oncology

## 2016-10-27 ENCOUNTER — Other Ambulatory Visit (HOSPITAL_COMMUNITY): Payer: Self-pay

## 2016-10-27 ENCOUNTER — Encounter (HOSPITAL_COMMUNITY): Payer: 59

## 2016-10-27 DIAGNOSIS — C7951 Secondary malignant neoplasm of bone: Secondary | ICD-10-CM | POA: Insufficient documentation

## 2016-10-27 DIAGNOSIS — C61 Malignant neoplasm of prostate: Secondary | ICD-10-CM

## 2016-10-27 LAB — COMPREHENSIVE METABOLIC PANEL
ALBUMIN: 3.7 g/dL (ref 3.5–5.0)
ALT: 17 U/L (ref 17–63)
ANION GAP: 8 (ref 5–15)
AST: 22 U/L (ref 15–41)
Alkaline Phosphatase: 55 U/L (ref 38–126)
BUN: 9 mg/dL (ref 6–20)
CHLORIDE: 107 mmol/L (ref 101–111)
CO2: 26 mmol/L (ref 22–32)
Calcium: 8.7 mg/dL — ABNORMAL LOW (ref 8.9–10.3)
Creatinine, Ser: 1.02 mg/dL (ref 0.61–1.24)
GFR calc Af Amer: >60 mL/min (ref >60)
Glucose, Bld: 131 mg/dL — ABNORMAL HIGH (ref 65–99)
POTASSIUM: 3.6 mmol/L (ref 3.5–5.1)
Sodium: 141 mmol/L (ref 135–145)
Total Bilirubin: 0.5 mg/dL (ref 0.3–1.2)
Total Protein: 6.8 g/dL (ref 6.5–8.1)

## 2016-10-27 MED ORDER — HYDROCODONE-ACETAMINOPHEN 10-325 MG PO TABS: 1.0000 | ORAL_TABLET | ORAL | 0 refills | Status: DC | PRN

## 2016-10-27 MED ORDER — FENTANYL 75 MCG/HR TD PT72: 75.0000 ug | MEDICATED_PATCH | TRANSDERMAL | 0 refills | Status: DC

## 2016-10-27 NOTE — Progress Notes (Signed)
Xgeva not given. Calcium 8.7, treatment parameters not met. Per T.Kefalas,PA, return as scheduled in 1 month for labs +/- xgeva. Instructed patient to be sure he is taking his calcium at home as prescribed. Verbalized understanding.

## 2016-10-27 NOTE — Telephone Encounter (Signed)
Patients daughter called for refill on pain medication because patient did not ask while he was in clinic this am. Also since his insurance has terminated, he is having to pay out of pocket for medication and it is cheaper for him to get fentanyl 75 mcg patches instead of a box of 50 mcg and a box of 25 mcg. Reviewed with PA-C. New prescriptions written.

## 2016-11-24 ENCOUNTER — Other Ambulatory Visit (HOSPITAL_COMMUNITY): Payer: Self-pay

## 2016-11-24 ENCOUNTER — Ambulatory Visit (HOSPITAL_COMMUNITY): Payer: Self-pay

## 2016-12-10 ENCOUNTER — Telehealth (HOSPITAL_COMMUNITY): Payer: Self-pay | Admitting: Oncology

## 2016-12-10 ENCOUNTER — Encounter (HOSPITAL_COMMUNITY): Payer: Self-pay

## 2016-12-10 ENCOUNTER — Encounter (HOSPITAL_BASED_OUTPATIENT_CLINIC_OR_DEPARTMENT_OTHER): Payer: BLUE CROSS/BLUE SHIELD

## 2016-12-10 ENCOUNTER — Encounter (HOSPITAL_COMMUNITY): Payer: BLUE CROSS/BLUE SHIELD

## 2016-12-10 ENCOUNTER — Encounter (HOSPITAL_COMMUNITY): Payer: BLUE CROSS/BLUE SHIELD | Attending: Oncology | Admitting: Oncology

## 2016-12-10 VITALS — BP 121/83 | HR 85 | Temp 98.0°F | Resp 18 | Wt 236.2 lb

## 2016-12-10 DIAGNOSIS — C7951 Secondary malignant neoplasm of bone: Secondary | ICD-10-CM

## 2016-12-10 DIAGNOSIS — C61 Malignant neoplasm of prostate: Secondary | ICD-10-CM | POA: Diagnosis present

## 2016-12-10 DIAGNOSIS — Z95828 Presence of other vascular implants and grafts: Secondary | ICD-10-CM

## 2016-12-10 LAB — COMPREHENSIVE METABOLIC PANEL
ALK PHOS: 66 U/L (ref 38–126)
ALT: 16 U/L — ABNORMAL LOW (ref 17–63)
ANION GAP: 7 (ref 5–15)
AST: 19 U/L (ref 15–41)
Albumin: 3.8 g/dL (ref 3.5–5.0)
BILIRUBIN TOTAL: 0.3 mg/dL (ref 0.3–1.2)
BUN: 12 mg/dL (ref 6–20)
CALCIUM: 8.7 mg/dL — AB (ref 8.9–10.3)
CO2: 26 mmol/L (ref 22–32)
Chloride: 106 mmol/L (ref 101–111)
Creatinine, Ser: 1.06 mg/dL (ref 0.61–1.24)
GFR calc non Af Amer: >60 mL/min (ref >60)
GLUCOSE: 126 mg/dL — AB (ref 65–99)
Potassium: 3.9 mmol/L (ref 3.5–5.1)
Sodium: 139 mmol/L (ref 135–145)
TOTAL PROTEIN: 7.1 g/dL (ref 6.5–8.1)

## 2016-12-10 LAB — CBC WITH DIFFERENTIAL/PLATELET
Basophils Absolute: 0 10*3/uL (ref 0.0–0.1)
Basophils Relative: 0 %
EOS ABS: 0.2 10*3/uL (ref 0.0–0.7)
EOS PCT: 4 %
HCT: 41.5 % (ref 39.0–52.0)
HEMOGLOBIN: 13.8 g/dL (ref 13.0–17.0)
LYMPHS ABS: 0.7 10*3/uL (ref 0.7–4.0)
Lymphocytes Relative: 15 %
MCH: 30 pg (ref 26.0–34.0)
MCHC: 33.3 g/dL (ref 30.0–36.0)
MCV: 90.2 fL (ref 78.0–100.0)
MONO ABS: 0.3 10*3/uL (ref 0.1–1.0)
MONOS PCT: 6 %
NEUTROS PCT: 75 %
Neutro Abs: 3.5 10*3/uL (ref 1.7–7.7)
Platelets: 183 10*3/uL (ref 150–400)
RBC: 4.6 MIL/uL (ref 4.22–5.81)
RDW: 13.7 % (ref 11.5–15.5)
WBC: 4.7 10*3/uL (ref 4.0–10.5)

## 2016-12-10 LAB — PSA: PSA: 0.2 ng/mL (ref 0.00–4.00)

## 2016-12-10 MED ORDER — FENTANYL 75 MCG/HR TD PT72: 75.0000 ug | MEDICATED_PATCH | TRANSDERMAL | 0 refills | Status: DC

## 2016-12-10 MED ORDER — LEUPROLIDE ACETATE (3 MONTH) 22.5 MG IM KIT: 22.5000 mg | PACK | Freq: Once | INTRAMUSCULAR | Status: DC

## 2016-12-10 MED ORDER — HYDROCODONE-ACETAMINOPHEN 10-325 MG PO TABS: 1.0000 | ORAL_TABLET | ORAL | 0 refills | Status: DC | PRN

## 2016-12-10 MED ORDER — HEPARIN SOD (PORK) LOCK FLUSH 100 UNIT/ML IV SOLN: 500.0000 [IU] | Freq: Once | INTRAVENOUS | Status: DC

## 2016-12-10 MED ORDER — DENOSUMAB 120 MG/1.7ML ~~LOC~~ SOLN
120.0000 mg | Freq: Once | SUBCUTANEOUS | Status: AC
  Administered 2016-12-10: 120 mg via SUBCUTANEOUS
  Filled 2016-12-10: qty 1.7

## 2016-12-10 MED ORDER — SODIUM CHLORIDE 0.9% FLUSH: 10.0000 mL | INTRAVENOUS | Status: DC | PRN

## 2016-12-10 NOTE — Progress Notes (Signed)
[name], [place] [id number]  No diagnosis found.  CURRENT THERAPY: [name] + [name] + [name]  INTERVAL HISTORY: SKYLAR PRIEST 61 y.o. male returns for followup of Stage IV prostate cancer with osseous involvement, S/P 6 cycles of Taxotere/Prednisone (01/07/2016- 04/11/2016), and [name] monthly beginning on 12/28/2015.  Now S/P palliative XRT to osseous lesion(s)    Prostate carcinoma ([place])   12/10/2015 Imaging    CTA chest, multiple thoracic and rib osseous lesions. no primary evident. Atherosclerosis including aortic and CAD      12/12/2015 Imaging    CT abdomen/pelvis with sclerotic osseous lesions througout the lumbar spine, sacrum, bilateral iliac bones and L proximal femur worrisome for sclerotic osseous mets,mild prostatomegaly, no LAD, Infrarenal 3.8 cm AAA      12/12/2015 Tumor Marker    PSA 153.85      12/18/2015 Imaging    Bone Scan Diffuse bony metastatic disease, posterior calvarium, sternum, thoracic, lumbar spine, bilateral ribs, L shoulder, bilateral bony pelvis, proximal L femur      12/24/2015 Initial Biopsy    CT biopsy of L iliac bone lesion      12/26/2015 Pathology Results    Bone, biopsy, left iliac - POSITIVE FOR METASTATIC ADENOCARCINOMA.      12/28/2015 - 09/01/2016 Chemotherapy    Firmagon instituted 240 mg       01/01/2016 Procedure    Port placed by IR.      01/07/2016 - 04/11/2016 Chemotherapy    Docetaxel every 21 days x 6 cycles with dose reductions due to tolerance      02/18/2016 Adverse Reaction    GI toxicity from docetaxel, dose reduced to 60 mg/m2      02/18/2016 Treatment Plan Change    Docetaxel dose reduced by 20%      03/06/2016 Procedure    Dr. Enrique Sack- Multiple extraction of tooth numbers 3, 5, 6, 8, 9, 10, 11, 12, 20, 21, 22, 27, 28, and 31. 4 Quadrants of alveoloplasty. Bilateral mandibular lingual tori reductions.      04/09/2016 Imaging    Brain MRI 1. No intracranial metastatic  disease 2. No acute intracranial abnormality 3. Findings of chronic microvascular disease      05/14/2016 Imaging    CT C/A/P Stable appearing diffuse bony metastatic disease. No new significant disease is seen Innumerable patchy sclerotic osseous metastases throughout the axial and proximal appendicular skeleton, stable in size and distribution, significantly increased in density in the interval, likely reflecting treatment effect. 2. No new or progressive metastatic disease in the chest, abdomen or pelvis. 3. Bilateral perifissural pulmonary nodules are stable and probably benign. 4. Aortic atherosclerosis. Infrarenal 4.3 cm abdominal aortic aneurysm, minimally increased in size. Recommend followup by ultrasound in 1 year.      06/02/2016 Tumor Marker    PSA 0.99 ng/ml       - 07/31/2016 Radiation Therapy    Palliative XRT by Dr. Lianne Cure in [place].       Chemotherapy    Depo-[name] every 3 months, beginning in Feb 2018       The patient presents to the clinic today for follow up. The patient reports "the same old bone pain" in his back and hips. He is continuing to take 1000 mg of calcium. He reports ongoing chronic constipation for which he takes Miralax without much relief. He reports he only eats once daily some days, but stays well hydrated. He is not working, and is  mostly sedentary at home. He reports he sleeps all day and cannot sleep at night. The patient reports he has quit smoking.  The patient's PSA has been decreasing recently. Results today pending.    Review of Systems  Constitutional: Negative for malaise/fatigue and weight loss.  HENT: Negative.   Eyes: Negative.   Respiratory: Negative for shortness of breath.   Cardiovascular: Negative.  Negative for chest pain.  Gastrointestinal: Negative for abdominal pain. Constipation: Ongoing, unchanged.  Genitourinary: Negative.   Musculoskeletal: Positive for back pain (8/10 in severity to the thoracic spine.).    Skin: Negative.   Neurological: Negative.   Endo/Heme/Allergies: Negative.   Psychiatric/Behavioral: The patient has insomnia (Reports he may go 2-3 days without sleep.Marland Kitchen He mostly sleeps during the day.).   All other systems reviewed and are negative.   Past Medical History:  Diagnosis Date  . Arthritis    Left knee  . Bone metastases ([facility]) 12/26/2015  . Cancer [facility])    Prostate  . DVT (deep venous thrombosis) ([place]) 2004  . Kidney stones    ~2002  . Prostate carcinoma ([facility]) 12/26/2015    Past Surgical History:  Procedure Laterality Date  . CIRCUMCISION  30 years ago  . MULTIPLE EXTRACTIONS WITH ALVEOLOPLASTY N/A 03/06/2016   Procedure: Extraction of tooth #'s 3,5,6,8-12,20-22, 27, 28, and 31 with alveoloplasty and bilateral mandibular tori reductions;  Surgeon: Lenn Cal, DDS;  Location: [facility];  Service: Oral Surgery;  Laterality: N/A;    Family History  Problem Relation Age of Onset  . Cancer Mother     Social History   Social History  . Marital status: Single    Spouse name: N/A  . Number of children: 2  . Years of education: N/A   Occupational History  . Construction/carpenter    Social History Main Topics  . Smoking status: Current Every Day Smoker    Packs/day: 1.00    Years: 50.00    Types: Cigarettes    Start date: 06/16/1965  . Smokeless tobacco: Former Systems developer    Types: Chew    Quit date: 08/04/2005  . Alcohol use No  . Drug use: Yes    Types: Marijuana     Comment: THC for appetite  . Sexual activity: Not on file   Other Topics Concern  . Not on file   Social History Narrative  . No narrative on file     PHYSICAL EXAMINATION  ECOG PERFORMANCE STATUS: 1 - Symptomatic but completely ambulatory  Vitals:   12/10/16 1021  BP: 121/83  Pulse: 85  Resp: 18  Temp: 98 F (36.7 C)    Physical Exam  Constitutional: He is oriented to person, place, and time and well-developed, well-nourished, and in no distress.  HENT:  Head:  Normocephalic and atraumatic.  Eyes: Conjunctivae and EOM are normal. Pupils are equal, round, and reactive to light.  Neck: Normal range of motion. Neck supple.  Cardiovascular: Normal rate, regular rhythm and normal heart sounds.  Exam reveals no gallop and no friction rub.   No murmur heard. Pulmonary/Chest: Effort normal and breath sounds normal. No respiratory distress. He has no wheezes. He has no rales. He exhibits no tenderness.  Abdominal: Soft. Bowel sounds are normal. He exhibits no distension. There is no tenderness. There is no rebound.  Musculoskeletal: Normal range of motion.  Neurological: He is alert and oriented to person, place, and time. Gait normal.  Skin: Skin is warm and dry.  Nursing note and vitals reviewed.  LABORATORY DATA: I have reviewed the data as follows. CBC    Component Value Date/Time   WBC 4.0 09/29/2016 1349   RBC 4.59 09/29/2016 1349   HGB 13.8 09/29/2016 1349   HCT 41.0 09/29/2016 1349   PLT 193 09/29/2016 1349   MCV 89.3 09/29/2016 1349   MCH 30.1 09/29/2016 1349   MCHC 33.7 09/29/2016 1349   RDW 14.8 09/29/2016 1349   LYMPHSABS 0.7 09/29/2016 1349   MONOABS 0.4 09/29/2016 1349   EOSABS 0.2 09/29/2016 1349   BASOSABS 0.0 09/29/2016 1349      Chemistry      Component Value Date/Time   NA 141 10/27/2016 0800   K 3.6 10/27/2016 0800   CL 107 10/27/2016 0800   CO2 26 10/27/2016 0800   BUN 9 10/27/2016 0800   CREATININE 1.02 10/27/2016 0800      Component Value Date/Time   CALCIUM 8.7 (L) 10/27/2016 0800   ALKPHOS 55 10/27/2016 0800   AST 22 10/27/2016 0800   ALT 17 10/27/2016 0800   BILITOT 0.5 10/27/2016 0800      PSA 09/01/16: 0.43 PSA 09/29/16: 0.28   LABS:  Ref Range & Units 56mo ago 91mo ago 40mo ago 30mo ago    PSA 0.00 - 4.00 ng/mL 0.28  0.43CM  0.84CM  1.05CM      RADIOGRAPHIC STUDIES: I have personally reviewed the radiological images as listed and agreed with the findings in the report. No results  found.   PATHOLOGY:    ASSESSMENT AND PLAN:  Stage IV prostate cancer with osseous involvement, S/P 6 cycles of Taxotere/Prednisone (01/07/2016- 04/11/2016), and [name] monthly beginning on 12/28/2015.  Now S/P palliative XRT to osseous lesion(s). Started on depo-lupron q 3 months in Feb 2018. Chronic pain syndrome from bone mets  PLAN: - Patient's disease has been responding. PSA has been trending down. Will f/u on today's PSA. - Refilled hydrocodone and fentanyl today for pain. - Continue xgeva q4 weeks for bone mets. Injection today. - Continue calcium supplement for hypocalcemia. - Port flush in 2 weeks, then set up for q8 weeks port flushes . - Continue lupron q 3 months, next dose will be in 2 weeks. - RTC in 3 months for follow up with PSA, CBC, CMP at that time.   ORDERS PLACED FOR THIS ENCOUNTER: No orders of the defined types were placed in this encounter.   MEDICATIONS PRESCRIBED THIS ENCOUNTER: No orders of the defined types were placed in this encounter.  All questions were answered. The patient knows to call the clinic with any problems, questions or concerns. We can certainly see the patient much sooner if necessary.  This document serves as a record of services personally performed by Twana First, MD. It was created on her behalf by Maryla Morrow, a trained medical scribe. The creation of this record is based on the scribe's personal observations and the provider's statements to them. This document has been checked and approved by the attending provider.  I have reviewed the above documentation for accuracy and completeness and I agree with the above.  This note is electronically signed by:  Twana First, MD 12/10/2016 9:33 AM

## 2016-12-10 NOTE — Progress Notes (Signed)
Okay to administer Xgeva today per Dr. Talbert Cage.  She is aware of pt calcium 8.8.  Leonette Most presented for Portacath access and flush.   Portacath located right chest wall accessed with  H 20 needle.  Good blood return present. Portacath flushed with 38ml NS and 500U/82ml Heparin and needle removed intact.  Procedure tolerated well and without incident.

## 2016-12-10 NOTE — Telephone Encounter (Signed)
REGISTERED PT FOR A XGEVA COPAY CARD BY PHONE. ELIGIBLE DATES5/9/19-06/2019 ID# [id number] MC# [id number] Y.B 638

## 2016-12-10 NOTE — Patient Instructions (Signed)
[place] at [facility] Discharge Instructions  RECOMMENDATIONS MADE BY THE CONSULTANT AND ANY TEST RESULTS WILL BE SENT TO YOUR REFERRING PHYSICIAN.  You were seen today by Dr. Twana First lupron injection and port flush in 2 weeks Get port flushed every 6-8 weeks xgeva monthly injection, you got it today Follow up in 3 months with lab work   Thank you for choosing [place] at [facility] to provide your oncology and hematology care.  To afford each patient quality time with our provider, please arrive at least 15 minutes before your scheduled appointment time.    If you have a lab appointment with the [facility] please come in thru the  Main Entrance and check in at the main information desk  You need to re-schedule your appointment should you arrive 10 or more minutes late.  We strive to give you quality time with our providers, and arriving late affects you and other patients whose appointments are after yours.  Also, if you no show three or more times for appointments you may be dismissed from the clinic at the providers discretion.     Again, thank you for choosing [facility].  Our hope is that these requests will decrease the amount of time that you wait before being seen by our physicians.       _____________________________________________________________  Should you have questions after your visit to [facility], please contact our office at [phone] between the hours of 8:30 a.m. and 4:30 p.m.  Voicemails left after 4:30 p.m. will not be returned until the following business day.  For prescription refill requests, have your pharmacy contact our office.       Resources For Cancer Patients and their Caregivers ? American Cancer Society: Can assist with transportation, wigs, general needs, runs Look Good Feel Better.        905-860-5294 ? Cancer Care: Provides financial assistance, online  support groups, medication/co-pay assistance.  1-800-813-HOPE 438 105 1070) ? [facility] Assists [place] cancer patients and their families through emotional , educational and financial support.  (803) 235-1596 ? [place] DSS Where to apply for food stamps, Medicaid and utility assistance. 253-736-2604 ? RCATS: Transportation to medical appointments. (818)417-8206 ? Social Security Administration: May apply for disability if have a Stage IV cancer. 629 179 5996 678-126-4246 ? [employer], Disability and Transit Services: Assists with nutrition, care and transit needs. [place] Support Programs: @10RELATIVEDAYS @ > Cancer Support Group  2nd Tuesday of the month 1pm-2pm, Journey Room  > Creative Journey  3rd Tuesday of the month 1130am-1pm, Journey Room  > Look Good Feel Better  1st Wednesday of the month 10am-12 noon, Journey Room (Call [place] to register [phone])

## 2016-12-22 ENCOUNTER — Ambulatory Visit (HOSPITAL_COMMUNITY): Payer: Self-pay

## 2016-12-22 ENCOUNTER — Other Ambulatory Visit (HOSPITAL_COMMUNITY): Payer: Self-pay

## 2016-12-24 ENCOUNTER — Encounter (HOSPITAL_BASED_OUTPATIENT_CLINIC_OR_DEPARTMENT_OTHER): Payer: BLUE CROSS/BLUE SHIELD

## 2016-12-24 ENCOUNTER — Other Ambulatory Visit (HOSPITAL_COMMUNITY): Payer: Self-pay

## 2016-12-24 ENCOUNTER — Encounter (HOSPITAL_COMMUNITY): Payer: Self-pay

## 2016-12-24 VITALS — BP 116/82 | HR 93 | Temp 97.9°F | Resp 18

## 2016-12-24 DIAGNOSIS — Z5111 Encounter for antineoplastic chemotherapy: Secondary | ICD-10-CM

## 2016-12-24 DIAGNOSIS — C61 Malignant neoplasm of prostate: Secondary | ICD-10-CM | POA: Diagnosis not present

## 2016-12-24 MED ORDER — SODIUM CHLORIDE 0.9% FLUSH
10.0000 mL | INTRAVENOUS | Status: DC | PRN
  Administered 2016-12-24: 10 mL via INTRAVENOUS
  Filled 2016-12-24: qty 10

## 2016-12-24 MED ORDER — LEUPROLIDE ACETATE (3 MONTH) 22.5 MG IM KIT
22.5000 mg | PACK | Freq: Once | INTRAMUSCULAR | Status: AC
  Administered 2016-12-24: 22.5 mg via INTRAMUSCULAR
  Filled 2016-12-24: qty 22.5

## 2016-12-24 MED ORDER — HEPARIN SOD (PORK) LOCK FLUSH 100 UNIT/ML IV SOLN
500.0000 [IU] | Freq: Once | INTRAVENOUS | Status: AC
  Administered 2016-12-24: 500 [IU] via INTRAVENOUS
  Filled 2016-12-24: qty 5

## 2016-12-24 NOTE — Patient Instructions (Signed)
[place] [name] at [facility] Discharge Instructions  RECOMMENDATIONS MADE BY THE CONSULTANT AND ANY TEST RESULTS WILL BE SENT TO YOUR REFERRING PHYSICIAN.  Port flush and lupron given today Follow up as scheduled  Thank you for choosing [place] at [facility] to provide your oncology and hematology care.  To afford each patient quality time with our provider, please arrive at least 15 minutes before your scheduled appointment time.    If you have a lab appointment with the [facility] please come in thru the  Main Entrance and check in at the main information desk  You need to re-schedule your appointment should you arrive 10 or more minutes late.  We strive to give you quality time with our providers, and arriving late affects you and other patients whose appointments are after yours.  Also, if you no show three or more times for appointments you may be dismissed from the clinic at the providers discretion.     Again, thank you for choosing [facility].  Our hope is that these requests will decrease the amount of time that you wait before being seen by our physicians.       _____________________________________________________________  Should you have questions after your visit to [facility], please contact our office at [phone] between the hours of 8:30 a.m. and 4:30 p.m.  Voicemails left after 4:30 p.m. will not be returned until the following business day.  For prescription refill requests, have your pharmacy contact our office.       Resources For Cancer Patients and their Caregivers ? American Cancer Society: Can assist with transportation, wigs, general needs, runs Look Good Feel Better.        2897166623 ? Cancer Care: Provides financial assistance, online support groups, medication/co-pay assistance.  1-800-813-HOPE (678)293-0201) ? [facility] Assists [place] cancer  patients and their families through emotional , educational and financial support.  938-202-9417 ? [place] DSS Where to apply for food stamps, Medicaid and utility assistance. 947 227 9419 ? RCATS: Transportation to medical appointments. 838-530-2287 ? Social Security Administration: May apply for disability if have a Stage IV cancer. 513-793-7303 321-046-0255 ? [employer], Disability and Transit Services: Assists with nutrition, care and transit needs. [place] Support Programs: @10RELATIVEDAYS @ > Cancer Support Group  2nd Tuesday of the month 1pm-2pm, Journey Room  > Creative Journey  3rd Tuesday of the month 1130am-1pm, Journey Room  > Look Good Feel Better  1st Wednesday of the month 10am-12 noon, Journey Room (Call [place] to register [phone])

## 2016-12-24 NOTE — Progress Notes (Signed)
Matthew [name] presented for Portacath access and flush. Portacath located rightchest wall accessed with  H 20 needle. Good blood return present. Portacath flushed with 47ml NS and 500U/22ml Heparin and needle removed intact. Procedure without incident. Patient tolerated procedure well.   Matthew [name] presents today for injection per MD orders. [name] 22.5 mg administered IM in the left upper buttocks  Administration without incident. Patient tolerated well.  Vitals stable and discharged home from clinic ambulatory. Follow up as scheduled.

## 2017-01-05 ENCOUNTER — Other Ambulatory Visit (HOSPITAL_COMMUNITY): Payer: Self-pay | Admitting: *Deleted

## 2017-01-05 DIAGNOSIS — C61 Malignant neoplasm of prostate: Secondary | ICD-10-CM

## 2017-01-05 DIAGNOSIS — C7951 Secondary malignant neoplasm of bone: Secondary | ICD-10-CM

## 2017-01-07 ENCOUNTER — Other Ambulatory Visit (HOSPITAL_COMMUNITY): Payer: Self-pay

## 2017-01-07 ENCOUNTER — Ambulatory Visit (HOSPITAL_COMMUNITY): Payer: Self-pay

## 2017-01-09 ENCOUNTER — Other Ambulatory Visit (HOSPITAL_COMMUNITY): Payer: Self-pay | Admitting: *Deleted

## 2017-01-09 DIAGNOSIS — C7951 Secondary malignant neoplasm of bone: Secondary | ICD-10-CM

## 2017-01-09 DIAGNOSIS — C61 Malignant neoplasm of prostate: Secondary | ICD-10-CM

## 2017-01-12 ENCOUNTER — Encounter (HOSPITAL_COMMUNITY): Payer: BLUE CROSS/BLUE SHIELD | Attending: Oncology

## 2017-01-12 ENCOUNTER — Encounter (HOSPITAL_COMMUNITY): Payer: BLUE CROSS/BLUE SHIELD

## 2017-01-12 ENCOUNTER — Encounter (HOSPITAL_COMMUNITY): Payer: Self-pay

## 2017-01-12 VITALS — BP 114/68 | HR 76 | Temp 98.3°F | Resp 18

## 2017-01-12 DIAGNOSIS — C61 Malignant neoplasm of prostate: Secondary | ICD-10-CM

## 2017-01-12 DIAGNOSIS — C7951 Secondary malignant neoplasm of bone: Secondary | ICD-10-CM

## 2017-01-12 LAB — CBC WITH DIFFERENTIAL/PLATELET
Basophils Absolute: 0 10*3/uL (ref 0.0–0.1)
Basophils Relative: 0 %
Eosinophils Absolute: 0.2 10*3/uL (ref 0.0–0.7)
Eosinophils Relative: 4 %
HEMATOCRIT: 40.1 % (ref 39.0–52.0)
HEMOGLOBIN: 13.4 g/dL (ref 13.0–17.0)
LYMPHS ABS: 0.8 10*3/uL (ref 0.7–4.0)
Lymphocytes Relative: 21 %
MCH: 29.7 pg (ref 26.0–34.0)
MCHC: 33.4 g/dL (ref 30.0–36.0)
MCV: 88.9 fL (ref 78.0–100.0)
MONOS PCT: 7 %
Monocytes Absolute: 0.3 10*3/uL (ref 0.1–1.0)
NEUTROS ABS: 2.8 10*3/uL (ref 1.7–7.7)
NEUTROS PCT: 68 %
Platelets: 180 10*3/uL (ref 150–400)
RBC: 4.51 MIL/uL (ref 4.22–5.81)
RDW: 13.9 % (ref 11.5–15.5)
WBC: 4.1 10*3/uL (ref 4.0–10.5)

## 2017-01-12 LAB — COMPREHENSIVE METABOLIC PANEL
ALK PHOS: 61 U/L (ref 38–126)
ALT: 14 U/L — ABNORMAL LOW (ref 17–63)
ANION GAP: 10 (ref 5–15)
AST: 18 U/L (ref 15–41)
Albumin: 3.6 g/dL (ref 3.5–5.0)
BILIRUBIN TOTAL: 0.4 mg/dL (ref 0.3–1.2)
BUN: 11 mg/dL (ref 6–20)
CALCIUM: 8.7 mg/dL — AB (ref 8.9–10.3)
CO2: 22 mmol/L (ref 22–32)
Chloride: 106 mmol/L (ref 101–111)
Creatinine, Ser: 1.01 mg/dL (ref 0.61–1.24)
GFR calc non Af Amer: >60 mL/min (ref >60)
Glucose, Bld: 121 mg/dL — ABNORMAL HIGH (ref 65–99)
Potassium: 4.1 mmol/L (ref 3.5–5.1)
Sodium: 138 mmol/L (ref 135–145)
TOTAL PROTEIN: 6.7 g/dL (ref 6.5–8.1)

## 2017-01-12 MED ORDER — DENOSUMAB 120 MG/1.7ML ~~LOC~~ SOLN
120.0000 mg | Freq: Once | SUBCUTANEOUS | Status: AC
  Administered 2017-01-12: 120 mg via SUBCUTANEOUS
  Filled 2017-01-12: qty 1.7

## 2017-01-12 MED ORDER — HYDROCODONE-ACETAMINOPHEN 10-325 MG PO TABS: 1.0000 | ORAL_TABLET | ORAL | 0 refills | Status: DC | PRN

## 2017-01-12 NOTE — Patient Instructions (Signed)
[facility] at [facility] Discharge Instructions  RECOMMENDATIONS MADE BY THE CONSULTANT AND ANY TEST RESULTS WILL BE SENT TO YOUR REFERRING PHYSICIAN.  Received Xgeva injection today. Follow-up as scheduled. Call clinic for any questions or concerns  Thank you for choosing [facility] at [facility] to provide your oncology and hematology care.  To afford each patient quality time with our provider, please arrive at least 15 minutes before your scheduled appointment time.    If you have a lab appointment with the Cancer Center please come in thru the  Main Entrance and check in at the main information desk  You need to re-schedule your appointment should you arrive 10 or more minutes late.  We strive to give you quality time with our providers, and arriving late affects you and other patients whose appointments are after yours.  Also, if you no show three or more times for appointments you may be dismissed from the clinic at the providers discretion.     Again, thank you for choosing [facility].  Our hope is that these requests will decrease the amount of time that you wait before being seen by our physicians.       _____________________________________________________________  Should you have questions after your visit to [facility], please contact our office at [phone] between the hours of 8:30 a.m. and 4:30 p.m.  Voicemails left after 4:30 p.m. will not be returned until the following business day.  For prescription refill requests, have your pharmacy contact our office.       Resources For Cancer Patients and their Caregivers ? American Cancer Society: Can assist with transportation, wigs, general needs, runs Look Good Feel Better.        1-888-227-6333 ? Cancer Care: Provides financial assistance, online support groups, medication/co-pay assistance.  1-800-813-HOPE (4673) ? [facility] Assists [place] cancer patients and their families through emotional , educational and financial support.  336-427-4357 ? [place] DSS Where to apply for food stamps, Medicaid and utility assistance. 336-342-1394 ? RCATS: Transportation to medical appointments. 336-347-2287 ? Social Security Administration: May apply for disability if have a Stage IV cancer. 336-342-7796 1-800-772-1213 ? [place] Aging, Disability and Transit Services: Assists with nutrition, care and transit needs. 336-349-2343  Cancer Center Support Programs: @10RELATIVEDAYS@ > Cancer Support Group  2nd Tuesday of the month 1pm-2pm, Journey Room  > Creative Journey  3rd Tuesday of the month 1130am-1pm, Journey Room  > Look Good Feel Better  1st Wednesday of the month 10am-12 noon, Journey Room (Call [employer] to register 1-800-395-5775)   

## 2017-01-12 NOTE — Progress Notes (Signed)
1350 Labs including Calcium 8.7 and Albumin 3.6 reviewed with Dr Oliva Bustard and pt approved for Xgeva injection today                                                                       Matthew [name] tolerated Xgeva injection well without complaints or incident. Pt denied any tooth,jaw or leg pain prior to receiving this injection. Pt continues to take his Calcium as prescribed. VSS Pt discharged self ambulatory in satisfactory condition

## 2017-01-13 ENCOUNTER — Telehealth (HOSPITAL_COMMUNITY): Payer: Self-pay | Admitting: Oncology

## 2017-01-13 NOTE — Telephone Encounter (Signed)
xgeva pending ins payment.

## 2017-01-21 ENCOUNTER — Ambulatory Visit (HOSPITAL_COMMUNITY): Payer: Self-pay

## 2017-01-21 ENCOUNTER — Other Ambulatory Visit (HOSPITAL_COMMUNITY): Payer: Self-pay

## 2017-01-27 ENCOUNTER — Other Ambulatory Visit (HOSPITAL_COMMUNITY): Payer: Self-pay | Admitting: Oncology

## 2017-01-27 DIAGNOSIS — C61 Malignant neoplasm of prostate: Secondary | ICD-10-CM

## 2017-01-27 DIAGNOSIS — C7951 Secondary malignant neoplasm of bone: Secondary | ICD-10-CM

## 2017-01-27 MED ORDER — FENTANYL 75 MCG/HR TD PT72: 75.0000 ug | MEDICATED_PATCH | TRANSDERMAL | 0 refills | Status: DC

## 2017-02-05 ENCOUNTER — Ambulatory Visit (HOSPITAL_COMMUNITY): Payer: Self-pay

## 2017-02-05 ENCOUNTER — Other Ambulatory Visit (HOSPITAL_COMMUNITY): Payer: Self-pay

## 2017-02-05 ENCOUNTER — Encounter (HOSPITAL_COMMUNITY): Payer: Self-pay

## 2017-02-05 ENCOUNTER — Encounter (HOSPITAL_COMMUNITY): Payer: BLUE CROSS/BLUE SHIELD

## 2017-02-05 ENCOUNTER — Encounter (HOSPITAL_COMMUNITY): Payer: BLUE CROSS/BLUE SHIELD | Attending: Oncology

## 2017-02-05 VITALS — BP 108/70 | HR 83 | Temp 98.1°F | Resp 18

## 2017-02-05 DIAGNOSIS — C7951 Secondary malignant neoplasm of bone: Secondary | ICD-10-CM

## 2017-02-05 DIAGNOSIS — C61 Malignant neoplasm of prostate: Secondary | ICD-10-CM

## 2017-02-05 LAB — PSA: Prostatic Specific Antigen: 0.26 ng/mL (ref 0.00–4.00)

## 2017-02-05 LAB — COMPREHENSIVE METABOLIC PANEL
ALBUMIN: 3.9 g/dL (ref 3.5–5.0)
ALK PHOS: 68 U/L (ref 38–126)
ALT: 20 U/L (ref 17–63)
AST: 20 U/L (ref 15–41)
Anion gap: 10 (ref 5–15)
BILIRUBIN TOTAL: 0.6 mg/dL (ref 0.3–1.2)
BUN: 12 mg/dL (ref 6–20)
CALCIUM: 9 mg/dL (ref 8.9–10.3)
CO2: 23 mmol/L (ref 22–32)
Chloride: 105 mmol/L (ref 101–111)
Creatinine, Ser: 0.96 mg/dL (ref 0.61–1.24)
GFR calc Af Amer: >60 mL/min (ref >60)
GFR calc non Af Amer: >60 mL/min (ref >60)
GLUCOSE: 114 mg/dL — AB (ref 65–99)
Potassium: 4 mmol/L (ref 3.5–5.1)
Sodium: 138 mmol/L (ref 135–145)
TOTAL PROTEIN: 7.4 g/dL (ref 6.5–8.1)

## 2017-02-05 LAB — CBC WITH DIFFERENTIAL/PLATELET
BASOS PCT: 0 %
Basophils Absolute: 0 10*3/uL (ref 0.0–0.1)
Eosinophils Absolute: 0.2 10*3/uL (ref 0.0–0.7)
Eosinophils Relative: 4 %
HEMATOCRIT: 41.5 % (ref 39.0–52.0)
HEMOGLOBIN: 13.8 g/dL (ref 13.0–17.0)
LYMPHS ABS: 1 10*3/uL (ref 0.7–4.0)
LYMPHS PCT: 23 %
MCH: 29.3 pg (ref 26.0–34.0)
MCHC: 33.3 g/dL (ref 30.0–36.0)
MCV: 88.1 fL (ref 78.0–100.0)
Monocytes Absolute: 0.4 10*3/uL (ref 0.1–1.0)
Monocytes Relative: 8 %
NEUTROS ABS: 2.9 10*3/uL (ref 1.7–7.7)
NEUTROS PCT: 65 %
Platelets: 180 10*3/uL (ref 150–400)
RBC: 4.71 MIL/uL (ref 4.22–5.81)
RDW: 14.1 % (ref 11.5–15.5)
WBC: 4.5 10*3/uL (ref 4.0–10.5)

## 2017-02-05 MED ORDER — DENOSUMAB 120 MG/1.7ML ~~LOC~~ SOLN
120.0000 mg | Freq: Once | SUBCUTANEOUS | Status: AC
  Administered 2017-02-05: 120 mg via SUBCUTANEOUS
  Filled 2017-02-05: qty 1.7

## 2017-02-05 MED ORDER — HYDROCODONE-ACETAMINOPHEN 10-325 MG PO TABS: 1.0000 | ORAL_TABLET | ORAL | 0 refills | Status: DC | PRN

## 2017-02-05 MED ORDER — FENTANYL 75 MCG/HR TD PT72: 75.0000 ug | MEDICATED_PATCH | TRANSDERMAL | 0 refills | Status: DC

## 2017-02-05 NOTE — Progress Notes (Signed)
Matthew [name] presents today for injection per the provider's orders.  Xgeva administration without incident; see MAR for injection details.  Patient tolerated procedure well and without incident.  No questions or complaints noted at this time.  Discharged ambulatory.

## 2017-03-04 ENCOUNTER — Encounter (HOSPITAL_COMMUNITY): Payer: BLUE CROSS/BLUE SHIELD

## 2017-03-04 ENCOUNTER — Encounter (HOSPITAL_COMMUNITY): Payer: BLUE CROSS/BLUE SHIELD | Attending: Oncology | Admitting: Oncology

## 2017-03-04 ENCOUNTER — Encounter (HOSPITAL_BASED_OUTPATIENT_CLINIC_OR_DEPARTMENT_OTHER): Payer: BLUE CROSS/BLUE SHIELD

## 2017-03-04 DIAGNOSIS — F17210 Nicotine dependence, cigarettes, uncomplicated: Secondary | ICD-10-CM | POA: Insufficient documentation

## 2017-03-04 DIAGNOSIS — M25552 Pain in left hip: Secondary | ICD-10-CM | POA: Diagnosis not present

## 2017-03-04 DIAGNOSIS — C61 Malignant neoplasm of prostate: Secondary | ICD-10-CM

## 2017-03-04 DIAGNOSIS — G894 Chronic pain syndrome: Secondary | ICD-10-CM | POA: Insufficient documentation

## 2017-03-04 DIAGNOSIS — R5382 Chronic fatigue, unspecified: Secondary | ICD-10-CM | POA: Diagnosis not present

## 2017-03-04 DIAGNOSIS — C7951 Secondary malignant neoplasm of bone: Secondary | ICD-10-CM

## 2017-03-04 DIAGNOSIS — M549 Dorsalgia, unspecified: Secondary | ICD-10-CM | POA: Diagnosis not present

## 2017-03-04 DIAGNOSIS — M7989 Other specified soft tissue disorders: Secondary | ICD-10-CM | POA: Insufficient documentation

## 2017-03-04 DIAGNOSIS — M1712 Unilateral primary osteoarthritis, left knee: Secondary | ICD-10-CM | POA: Diagnosis not present

## 2017-03-04 LAB — COMPREHENSIVE METABOLIC PANEL
ALBUMIN: 3.7 g/dL (ref 3.5–5.0)
ALK PHOS: 64 U/L (ref 38–126)
ALT: 21 U/L (ref 17–63)
ANION GAP: 9 (ref 5–15)
AST: 22 U/L (ref 15–41)
BILIRUBIN TOTAL: 0.6 mg/dL (ref 0.3–1.2)
BUN: 13 mg/dL (ref 6–20)
CHLORIDE: 107 mmol/L (ref 101–111)
CO2: 26 mmol/L (ref 22–32)
CREATININE: 1 mg/dL (ref 0.61–1.24)
Calcium: 9.3 mg/dL (ref 8.9–10.3)
GFR calc non Af Amer: >60 mL/min (ref >60)
Glucose, Bld: 145 mg/dL — ABNORMAL HIGH (ref 65–99)
POTASSIUM: 4 mmol/L (ref 3.5–5.1)
Sodium: 142 mmol/L (ref 135–145)
Total Protein: 6.9 g/dL (ref 6.5–8.1)

## 2017-03-04 LAB — CBC WITH DIFFERENTIAL/PLATELET
BASOS ABS: 0 10*3/uL (ref 0.0–0.1)
BASOS PCT: 0 %
Eosinophils Absolute: 0.2 10*3/uL (ref 0.0–0.7)
Eosinophils Relative: 5 %
HEMATOCRIT: 40.8 % (ref 39.0–52.0)
HEMOGLOBIN: 13.8 g/dL (ref 13.0–17.0)
Lymphocytes Relative: 23 %
Lymphs Abs: 0.9 10*3/uL (ref 0.7–4.0)
MCH: 30.1 pg (ref 26.0–34.0)
MCHC: 33.8 g/dL (ref 30.0–36.0)
MCV: 89.1 fL (ref 78.0–100.0)
Monocytes Absolute: 0.2 10*3/uL (ref 0.1–1.0)
Monocytes Relative: 6 %
NEUTROS ABS: 2.7 10*3/uL (ref 1.7–7.7)
NEUTROS PCT: 66 %
Platelets: 169 10*3/uL (ref 150–400)
RBC: 4.58 MIL/uL (ref 4.22–5.81)
RDW: 14.4 % (ref 11.5–15.5)
WBC: 4 10*3/uL (ref 4.0–10.5)

## 2017-03-04 LAB — PSA: PROSTATIC SPECIFIC ANTIGEN: 0.31 ng/mL (ref 0.00–4.00)

## 2017-03-04 MED ORDER — HYDROCODONE-ACETAMINOPHEN 10-325 MG PO TABS
1.0000 | ORAL_TABLET | ORAL | 0 refills | Status: DC | PRN
Start: 2017-03-04 — End: 2017-04-29

## 2017-03-04 MED ORDER — DENOSUMAB 120 MG/1.7ML ~~LOC~~ SOLN
120.0000 mg | Freq: Once | SUBCUTANEOUS | Status: AC
  Administered 2017-03-04: 120 mg via SUBCUTANEOUS
  Filled 2017-03-04: qty 1.7

## 2017-03-04 MED ORDER — ERGOCALCIFEROL 1.25 MG (50000 UT) PO CAPS: 50000.0000 [IU] | ORAL_CAPSULE | ORAL | 4 refills | Status: DC

## 2017-03-04 NOTE — Progress Notes (Signed)
Matthew [name] presents today for injection per the provider's orders.  Xgeva administration without incident; see MAR for injection details.  Patient tolerated procedure well and without incident.  No questions or complaints noted at this time.  Discharged ambulatory.

## 2017-03-04 NOTE — Progress Notes (Signed)
[place] [id number]  Prostate carcinoma [facility]) - Plan: ergocalciferol (VITAMIN D2) 50000 units capsule, HYDROcodone-acetaminophen (NORCO) 10-325 MG tablet  Bone metastases ([place]) - Plan: ergocalciferol (VITAMIN D2) 50000 units capsule, HYDROcodone-acetaminophen (NORCO) 10-325 MG tablet  CURRENT THERAPY: Matthew [name] + [name] + [name]  INTERVAL HISTORY: Matthew [name] 61 y.o. male returns for followup of Stage IV prostate cancer with osseous involvement, S/P 6 cycles of Taxotere/Prednisone (01/07/2016- 04/11/2016), and Matthew [name] monthly beginning on 12/28/2015.  Now S/P palliative XRT to osseous lesion(s)    Prostate carcinoma ([place])   12/10/2015 Imaging    CTA chest, multiple thoracic and rib osseous lesions. no primary evident. Atherosclerosis including aortic and CAD      12/12/2015 Imaging    CT abdomen/pelvis with sclerotic osseous lesions througout the lumbar spine, sacrum, bilateral iliac bones and L proximal femur worrisome for sclerotic osseous mets,mild prostatomegaly, no LAD, Infrarenal 3.8 cm AAA      12/12/2015 Tumor Marker    PSA 153.85      12/18/2015 Imaging    Bone Scan Diffuse bony metastatic disease, posterior calvarium, sternum, thoracic, lumbar spine, bilateral ribs, L shoulder, bilateral bony pelvis, proximal L femur      12/24/2015 Initial Biopsy    CT biopsy of L iliac bone lesion      12/26/2015 Pathology Results    Bone, biopsy, left iliac - POSITIVE FOR METASTATIC ADENOCARCINOMA.      12/28/2015 - 09/01/2016 Chemotherapy    Firmagon instituted 240 mg       01/01/2016 Procedure    Port placed by IR.      01/07/2016 - 04/11/2016 Chemotherapy    Docetaxel every 21 days x 6 cycles with dose reductions due to tolerance      02/18/2016 Adverse Reaction    GI toxicity from docetaxel, dose reduced to 60 mg/m2      02/18/2016 Treatment Plan Change    Docetaxel dose reduced by 20%      03/06/2016 Procedure    Dr.  Enrique Sack- Multiple extraction of tooth numbers 3, 5, 6, 8, 9, 10, 11, 12, 20, 21, 22, 27, 28, and 31. 4 Quadrants of alveoloplasty. Bilateral mandibular lingual tori reductions.      04/09/2016 Imaging    Brain MRI 1. No intracranial metastatic disease 2. No acute intracranial abnormality 3. Findings of chronic microvascular disease      05/14/2016 Imaging    CT C/A/P Stable appearing diffuse bony metastatic disease. No new significant disease is seen Innumerable patchy sclerotic osseous metastases throughout the axial and proximal appendicular skeleton, stable in size and distribution, significantly increased in density in the interval, likely reflecting treatment effect. 2. No new or progressive metastatic disease in the chest, abdomen or pelvis. 3. Bilateral perifissural pulmonary nodules are stable and probably benign. 4. Aortic atherosclerosis. Infrarenal 4.3 cm abdominal aortic aneurysm, minimally increased in size. Recommend followup by ultrasound in 1 year.      06/02/2016 Tumor Marker    PSA 0.99 ng/ml       - 07/31/2016 Radiation Therapy    Palliative XRT by Dr. Lianne Cure in [place].       Chemotherapy    Depo-[name] every 3 months, beginning in Feb 2018       Patient presents today for continued follow-up. He states that he is doing the same. He has chronic fatigue. He also complains of chronic pain in his back, left knee, left hip. He states that  his left knee has been swelling the past few weeks since his sperm rating so much. He has a history of arthritis in the left knee. Otherwise has no other complaints today. He has been tolerating [name] well without any side effects. Most recent PSA from 02/05/17 was 0.26. CBC today was within normal limits.  Review of Systems  Constitutional: Positive for malaise/fatigue. Negative for weight loss.  HENT: Negative.   Eyes: Negative.   Respiratory: Negative for shortness of breath.   Cardiovascular: Negative.  Negative for chest  pain.  Gastrointestinal: Negative for abdominal pain. Constipation: Ongoing, unchanged.  Genitourinary: Negative.   Musculoskeletal: Negative for back pain (8/10 in severity to the thoracic spine.).  Skin: Negative.   Neurological: Negative.   Endo/Heme/Allergies: Negative.   Psychiatric/Behavioral: The patient does not have insomnia.   All other systems reviewed and are negative.   Past Medical History:  Diagnosis Date  . Arthritis    Left knee  . Bone metastases ([place]) 12/26/2015  . Cancer [facility])    Prostate  . DVT (deep venous thrombosis) ([place]) 2004  . Kidney stones    ~2002  . Prostate carcinoma ([place]) 12/26/2015    Past Surgical History:  Procedure Laterality Date  . CIRCUMCISION  30 years ago  . MULTIPLE EXTRACTIONS WITH ALVEOLOPLASTY N/A 03/06/2016   Procedure: Extraction of tooth #'s 3,5,6,8-12,20-22, 27, 28, and 31 with alveoloplasty and bilateral mandibular tori reductions;  Surgeon: Lenn Cal, DDS;  Location: [place];  Service: Oral Surgery;  Laterality: N/A;    Family History  Problem Relation Age of Onset  . Cancer Mother     Social History   Social History  . Marital status: Single    Spouse name: N/A  . Number of children: 2  . Years of education: N/A   Occupational History  . Construction/carpenter    Social History Main Topics  . Smoking status: Current Every Day Smoker    Packs/day: 1.00    Years: 50.00    Types: Cigarettes    Start date: 06/16/1965  . Smokeless tobacco: Former Systems developer    Types: Chew    Quit date: 08/04/2005  . Alcohol use No  . Drug use: Yes    Types: Marijuana     Comment: THC for appetite  . Sexual activity: Not on file   Other Topics Concern  . Not on file   Social History Narrative  . No narrative on file     PHYSICAL EXAMINATION  ECOG PERFORMANCE STATUS: 1 - Symptomatic but completely ambulatory  Vitals:   03/04/17 1036  BP: 101/71  Pulse: 89  Resp: 20  Temp: 98.1 F (36.7 C)    Physical Exam    Constitutional: He is oriented to person, place, and time and well-developed, well-nourished, and in no distress.  HENT:  Head: Normocephalic and atraumatic.  Eyes: Pupils are equal, round, and reactive to light. Conjunctivae and EOM are normal.  Neck: Normal range of motion. Neck supple.  Cardiovascular: Normal rate, regular rhythm and normal heart sounds.  Exam reveals no gallop and no friction rub.   No murmur heard. Pulmonary/Chest: Effort normal and breath sounds normal. No respiratory distress. He has no wheezes. He has no rales. He exhibits no tenderness.  Abdominal: Soft. Bowel sounds are normal. He exhibits no distension. There is no tenderness. There is no rebound.  Musculoskeletal: Normal range of motion.  Left knee swelling  Neurological: He is alert and oriented to person, place, and time. Gait normal.  Skin: Skin is warm and dry.  Nursing note and vitals reviewed.    LABORATORY DATA: I have reviewed the data as follows. CBC    Component Value Date/Time   WBC 4.0 03/04/2017 1007   RBC 4.58 03/04/2017 1007   HGB 13.8 03/04/2017 1007   HCT 40.8 03/04/2017 1007   PLT 169 03/04/2017 1007   MCV 89.1 03/04/2017 1007   MCH 30.1 03/04/2017 1007   MCHC 33.8 03/04/2017 1007   RDW 14.4 03/04/2017 1007   LYMPHSABS 0.9 03/04/2017 1007   MONOABS 0.2 03/04/2017 1007   EOSABS 0.2 03/04/2017 1007   BASOSABS 0.0 03/04/2017 1007      Chemistry      Component Value Date/Time   NA 138 02/05/2017 0913   K 4.0 02/05/2017 0913   CL 105 02/05/2017 0913   CO2 23 02/05/2017 0913   BUN 12 02/05/2017 0913   CREATININE 0.96 02/05/2017 0913      Component Value Date/Time   CALCIUM 9.0 02/05/2017 0913   ALKPHOS 68 02/05/2017 0913   AST 20 02/05/2017 0913   ALT 20 02/05/2017 0913   BILITOT 0.6 02/05/2017 0913      PSA 09/01/16: 0.43 PSA 09/29/16: 0.28   LABS:  Ref Range & Units 59mo ago 68mo ago 36mo ago 55mo ago    PSA 0.00 - 4.00 ng/mL 0.28  0.43CM  0.84CM  1.05CM       RADIOGRAPHIC STUDIES: I have personally reviewed the radiological images as listed and agreed with the findings in the report. No results found.   PATHOLOGY:    ASSESSMENT AND PLAN:  Stage IV prostate cancer with osseous involvement, S/P 6 cycles of Taxotere/Prednisone (01/07/2016- 04/11/2016), and Matthew [name] monthly beginning on 12/28/2015.  Now S/P palliative XRT to osseous lesion(s). Started on depo-lupron q 3 months in Feb 2018. Chronic pain syndrome from bone mets  PLAN: - PSA has been holding steady. Will f/u on today's PSA. - Refilled hydrocodone and vitamin D today. - Continue xgeva q4 weeks for bone mets. Injection today. - Continue calcium supplement for hypocalcemia. - Port flush q8 weeks  - Continue lupron q 3 months. - RTC in 3 months for follow up with testorsterone, PSA, CBC, CMP at that time.   ORDERS PLACED FOR THIS ENCOUNTER: Orders Placed This Encounter  Procedures  . Testosterone  . CBC with Differential  . Comprehensive metabolic panel  . PSA    MEDICATIONS PRESCRIBED THIS ENCOUNTER: Meds ordered this encounter  Medications  . ergocalciferol (VITAMIN D2) 50000 units capsule    Sig: Take 1 capsule (50,000 Units total) by mouth every Friday.    Dispense:  30 capsule    Refill:  4  . HYDROcodone-acetaminophen (NORCO) 10-325 MG tablet    Sig: Take 1 tablet by mouth every 4 (four) hours as needed.    Dispense:  80 tablet    Refill:  0   All questions were answered. The patient knows to call the clinic with any problems, questions or concerns. We can certainly see the patient much sooner if necessary.   This note is electronically signed by:  Twana First, MD 03/04/2017 10:59 AM

## 2017-03-04 NOTE — Patient Instructions (Signed)
[facility] at [facility] Discharge Instructions  RECOMMENDATIONS MADE BY THE CONSULTANT AND ANY TEST RESULTS WILL BE SENT TO YOUR REFERRING PHYSICIAN.  You were seen by Dr. Zhou today  Thank you for choosing [facility] at [facility] to provide your oncology and hematology care.  To afford each patient quality time with our provider, please arrive at least 15 minutes before your scheduled appointment time.    If you have a lab appointment with the Cancer Center please come in thru the  Main Entrance and check in at the main information desk  You need to re-schedule your appointment should you arrive 10 or more minutes late.  We strive to give you quality time with our providers, and arriving late affects you and other patients whose appointments are after yours.  Also, if you no show three or more times for appointments you may be dismissed from the clinic at the providers discretion.     Again, thank you for choosing [facility].  Our hope is that these requests will decrease the amount of time that you wait before being seen by our physicians.       _____________________________________________________________  Should you have questions after your visit to [facility], please contact our office at [phone] between the hours of 8:30 a.m. and 4:30 p.m.  Voicemails left after 4:30 p.m. will not be returned until the following business day.  For prescription refill requests, have your pharmacy contact our office.       Resources For Cancer Patients and their Caregivers ? American Cancer Society: Can assist with transportation, wigs, general needs, runs Look Good Feel Better.        1-888-227-6333 ? Cancer Care: Provides financial assistance, online support groups, medication/co-pay assistance.  1-800-813-HOPE (4673) ? [facility] Assists [place] cancer patients and their families  through emotional , educational and financial support.  336-427-4357 ? [place] DSS Where to apply for food stamps, Medicaid and utility assistance. 336-342-1394 ? [facility]: Transportation to medical appointments. 336-347-2287 ? Social Security Administration: May apply for disability if have a Stage IV cancer. 336-342-7796 1-800-772-1213 ? [place] Aging, Disability and Transit Services: Assists with nutrition, care and transit needs. 336-349-2343  Cancer Center Support Programs: @10RELATIVEDAYS@ > Cancer Support Group  2nd Tuesday of the month 1pm-2pm, Journey Room  > Creative Journey  3rd Tuesday of the month 1130am-1pm, Journey Room  > Look Good Feel Better  1st Wednesday of the month 10am-12 noon, Journey Room (Call American [employer] to register 1-800-395-5775)   

## 2017-03-12 ENCOUNTER — Ambulatory Visit (HOSPITAL_COMMUNITY): Payer: Self-pay

## 2017-03-24 ENCOUNTER — Other Ambulatory Visit (HOSPITAL_COMMUNITY): Payer: Self-pay | Admitting: Hematology & Oncology

## 2017-03-24 DIAGNOSIS — C7951 Secondary malignant neoplasm of bone: Secondary | ICD-10-CM

## 2017-03-24 DIAGNOSIS — C61 Malignant neoplasm of prostate: Secondary | ICD-10-CM

## 2017-03-31 ENCOUNTER — Other Ambulatory Visit (HOSPITAL_COMMUNITY): Payer: Self-pay | Admitting: *Deleted

## 2017-03-31 DIAGNOSIS — C61 Malignant neoplasm of prostate: Secondary | ICD-10-CM

## 2017-04-01 ENCOUNTER — Encounter (HOSPITAL_BASED_OUTPATIENT_CLINIC_OR_DEPARTMENT_OTHER): Payer: BLUE CROSS/BLUE SHIELD

## 2017-04-01 ENCOUNTER — Encounter (HOSPITAL_COMMUNITY): Payer: BLUE CROSS/BLUE SHIELD

## 2017-04-01 VITALS — BP 106/74 | HR 87 | Temp 98.6°F | Resp 20 | Wt 231.6 lb

## 2017-04-01 DIAGNOSIS — Z5111 Encounter for antineoplastic chemotherapy: Secondary | ICD-10-CM | POA: Diagnosis not present

## 2017-04-01 DIAGNOSIS — C61 Malignant neoplasm of prostate: Secondary | ICD-10-CM

## 2017-04-01 DIAGNOSIS — C7951 Secondary malignant neoplasm of bone: Secondary | ICD-10-CM | POA: Diagnosis not present

## 2017-04-01 LAB — CBC WITH DIFFERENTIAL/PLATELET
Basophils Absolute: 0 10*3/uL (ref 0.0–0.1)
Basophils Relative: 0 %
EOS PCT: 3 %
Eosinophils Absolute: 0.2 10*3/uL (ref 0.0–0.7)
HEMATOCRIT: 39.7 % (ref 39.0–52.0)
Hemoglobin: 13.4 g/dL (ref 13.0–17.0)
LYMPHS PCT: 23 %
Lymphs Abs: 1.1 10*3/uL (ref 0.7–4.0)
MCH: 30.2 pg (ref 26.0–34.0)
MCHC: 33.8 g/dL (ref 30.0–36.0)
MCV: 89.6 fL (ref 78.0–100.0)
MONO ABS: 0.3 10*3/uL (ref 0.1–1.0)
MONOS PCT: 6 %
NEUTROS ABS: 3.1 10*3/uL (ref 1.7–7.7)
Neutrophils Relative %: 68 %
PLATELETS: 169 10*3/uL (ref 150–400)
RBC: 4.43 MIL/uL (ref 4.22–5.81)
RDW: 14 % (ref 11.5–15.5)
WBC: 4.6 10*3/uL (ref 4.0–10.5)

## 2017-04-01 LAB — COMPREHENSIVE METABOLIC PANEL
ALT: 16 U/L — ABNORMAL LOW (ref 17–63)
ANION GAP: 8 (ref 5–15)
AST: 18 U/L (ref 15–41)
Albumin: 3.6 g/dL (ref 3.5–5.0)
Alkaline Phosphatase: 67 U/L (ref 38–126)
BILIRUBIN TOTAL: 0.3 mg/dL (ref 0.3–1.2)
BUN: 12 mg/dL (ref 6–20)
CHLORIDE: 106 mmol/L (ref 101–111)
CO2: 25 mmol/L (ref 22–32)
Calcium: 9 mg/dL (ref 8.9–10.3)
Creatinine, Ser: 1.07 mg/dL (ref 0.61–1.24)
Glucose, Bld: 128 mg/dL — ABNORMAL HIGH (ref 65–99)
POTASSIUM: 3.8 mmol/L (ref 3.5–5.1)
Sodium: 139 mmol/L (ref 135–145)
TOTAL PROTEIN: 6.4 g/dL — AB (ref 6.5–8.1)

## 2017-04-01 MED ORDER — DENOSUMAB 120 MG/1.7ML ~~LOC~~ SOLN
120.0000 mg | Freq: Once | SUBCUTANEOUS | Status: AC
  Administered 2017-04-01: 120 mg via SUBCUTANEOUS
  Filled 2017-04-01: qty 1.7

## 2017-04-01 MED ORDER — LEUPROLIDE ACETATE (3 MONTH) 22.5 MG IM KIT
22.5000 mg | PACK | Freq: Once | INTRAMUSCULAR | Status: AC
  Administered 2017-04-01: 22.5 mg via INTRAMUSCULAR
  Filled 2017-04-01: qty 22.5

## 2017-04-01 MED ORDER — FENTANYL 75 MCG/HR TD PT72: 75.0000 ug | MEDICATED_PATCH | TRANSDERMAL | 0 refills | Status: DC

## 2017-04-01 NOTE — Progress Notes (Signed)
Matthew [name] presents today for injection per MD orders. XGEVA 120mg  administered SQ in left Abdomen. Administration without incident. Patient tolerated well.  Matthew [name] presents today for injection per MD orders. [name] 22.5mg  administered IM in right Gluteal. Administration without incident. Patient tolerated well.  Fentanyl prescription given to pt today.

## 2017-04-29 ENCOUNTER — Encounter (HOSPITAL_COMMUNITY): Payer: BLUE CROSS/BLUE SHIELD | Attending: Oncology

## 2017-04-29 ENCOUNTER — Encounter (HOSPITAL_COMMUNITY): Payer: BLUE CROSS/BLUE SHIELD

## 2017-04-29 ENCOUNTER — Encounter (HOSPITAL_COMMUNITY): Payer: Self-pay

## 2017-04-29 VITALS — BP 124/78 | HR 80 | Temp 98.1°F | Resp 18

## 2017-04-29 DIAGNOSIS — C7951 Secondary malignant neoplasm of bone: Secondary | ICD-10-CM | POA: Diagnosis not present

## 2017-04-29 DIAGNOSIS — C61 Malignant neoplasm of prostate: Secondary | ICD-10-CM | POA: Insufficient documentation

## 2017-04-29 DIAGNOSIS — Z95828 Presence of other vascular implants and grafts: Secondary | ICD-10-CM

## 2017-04-29 LAB — COMPREHENSIVE METABOLIC PANEL
ALK PHOS: 57 U/L (ref 38–126)
ALT: 19 U/L (ref 17–63)
ANION GAP: 12 (ref 5–15)
AST: 19 U/L (ref 15–41)
Albumin: 3.9 g/dL (ref 3.5–5.0)
BILIRUBIN TOTAL: 0.6 mg/dL (ref 0.3–1.2)
BUN: 19 mg/dL (ref 6–20)
CALCIUM: 10.6 mg/dL — AB (ref 8.9–10.3)
CO2: 26 mmol/L (ref 22–32)
Chloride: 101 mmol/L (ref 101–111)
Creatinine, Ser: 1.15 mg/dL (ref 0.61–1.24)
GFR calc non Af Amer: >60 mL/min (ref >60)
GLUCOSE: 138 mg/dL — AB (ref 65–99)
Potassium: 4 mmol/L (ref 3.5–5.1)
Sodium: 139 mmol/L (ref 135–145)
TOTAL PROTEIN: 6.9 g/dL (ref 6.5–8.1)

## 2017-04-29 LAB — CBC WITH DIFFERENTIAL/PLATELET
Basophils Absolute: 0 10*3/uL (ref 0.0–0.1)
Basophils Relative: 0 %
Eosinophils Absolute: 0.1 10*3/uL (ref 0.0–0.7)
Eosinophils Relative: 3 %
HEMATOCRIT: 39.6 % (ref 39.0–52.0)
HEMOGLOBIN: 13 g/dL (ref 13.0–17.0)
Lymphocytes Relative: 19 %
Lymphs Abs: 0.8 10*3/uL (ref 0.7–4.0)
MCH: 29.8 pg (ref 26.0–34.0)
MCHC: 32.8 g/dL (ref 30.0–36.0)
MCV: 90.8 fL (ref 78.0–100.0)
MONOS PCT: 7 %
Monocytes Absolute: 0.3 10*3/uL (ref 0.1–1.0)
NEUTROS PCT: 71 %
Neutro Abs: 2.8 10*3/uL (ref 1.7–7.7)
Platelets: 162 10*3/uL (ref 150–400)
RBC: 4.36 MIL/uL (ref 4.22–5.81)
RDW: 14.1 % (ref 11.5–15.5)
WBC: 4 10*3/uL (ref 4.0–10.5)

## 2017-04-29 MED ORDER — SODIUM CHLORIDE 0.9% FLUSH
10.0000 mL | INTRAVENOUS | Status: DC | PRN
  Administered 2017-04-29: 10 mL via INTRAVENOUS
  Filled 2017-04-29: qty 10

## 2017-04-29 MED ORDER — HEPARIN SOD (PORK) LOCK FLUSH 100 UNIT/ML IV SOLN
500.0000 [IU] | Freq: Once | INTRAVENOUS | Status: AC
  Administered 2017-04-29: 500 [IU] via INTRAVENOUS

## 2017-04-29 MED ORDER — HYDROCODONE-ACETAMINOPHEN 10-325 MG PO TABS: 1.0000 | ORAL_TABLET | ORAL | 0 refills | Status: DC | PRN

## 2017-04-29 MED ORDER — DENOSUMAB 120 MG/1.7ML ~~LOC~~ SOLN
120.0000 mg | Freq: Once | SUBCUTANEOUS | Status: AC
  Administered 2017-04-29: 120 mg via SUBCUTANEOUS
  Filled 2017-04-29: qty 1.7

## 2017-04-29 MED ORDER — PROCHLORPERAZINE MALEATE 10 MG PO TABS: 10.0000 mg | ORAL_TABLET | Freq: Four times a day (QID) | ORAL | 0 refills | Status: DC | PRN

## 2017-04-29 MED ORDER — FENTANYL 75 MCG/HR TD PT72: 75.0000 ug | MEDICATED_PATCH | TRANSDERMAL | 0 refills | Status: DC

## 2017-04-29 NOTE — Progress Notes (Signed)
Labs reviewed with Dr. Talbert [name] and pt approved for Xgeva injection today. Calcium 10.6 and pt has been taking 3 Calcium pills daily so Dr. Talbert [name] recommended for him to decrease it to 2 pills daily and pt verbalized understanding.Matthew [name] tolerated his Xgeva injection and port flush well without complaints. Port accessed with 20 gauge needle with blood return noted then flushed with 10 ml NS and 5 ml Heparin easily per protocol then de-accessed. Pt denied any tooth,jaw or leg pain as well as recent or future dental visits prior to administering the Xgeva injection. VSS Pt discharged self ambulatory in satisfactory condition

## 2017-04-29 NOTE — Patient Instructions (Signed)
[place] at [facility] Discharge Instructions  RECOMMENDATIONS MADE BY THE CONSULTANT AND ANY TEST RESULTS WILL BE SENT TO YOUR REFERRING PHYSICIAN.  Received Xgeva injection today and portacath flushed per protocol. Follow-up as scheduled. Call clinic for any questions or concerns  Thank you for choosing [place] at [facility] to provide your oncology and hematology care.  To afford each patient quality time with our provider, please arrive at least 15 minutes before your scheduled appointment time.    If you have a lab appointment with the [place] please come in thru the  Main Entrance and check in at the main information desk  You need to re-schedule your appointment should you arrive 10 or more minutes late.  We strive to give you quality time with our providers, and arriving late affects you and other patients whose appointments are after yours.  Also, if you no show three or more times for appointments you may be dismissed from the clinic at the providers discretion.     Again, thank you for choosing [facility].  Our hope is that these requests will decrease the amount of time that you wait before being seen by our physicians.       _____________________________________________________________  Should you have questions after your visit to [facility], please contact our office at [phone] between the hours of 8:30 a.m. and 4:30 p.m.  Voicemails left after 4:30 p.m. will not be returned until the following business day.  For prescription refill requests, have your pharmacy contact our office.       Resources For Cancer Patients and their Caregivers ? American Cancer Society: Can assist with transportation, wigs, general needs, runs Look Good Feel Better.        2157372166 ? Cancer Care: Provides financial assistance, online support groups, medication/co-pay assistance.  1-800-813-HOPE  308 419 5386) ? [facility] Assists [place] cancer patients and their families through emotional , educational and financial support.  817-339-0454 ? [place] DSS Where to apply for food stamps, Medicaid and utility assistance. (434) 342-0304 ? RCATS: Transportation to medical appointments. 270-652-0974 ? Social Security Administration: May apply for disability if have a Stage IV cancer. (574)072-5010 8672195216 ? [employer], Disability and Transit Services: Assists with nutrition, care and transit needs. [place] Support Programs: @10RELATIVEDAYS @ > Cancer Support Group  2nd Tuesday of the month 1pm-2pm, Journey Room  > Creative Journey  3rd Tuesday of the month 1130am-1pm, Journey Room  > Look Good Feel Better  1st Wednesday of the month 10am-12 noon, Journey Room (Call [place] to register [phone])

## 2017-05-25 ENCOUNTER — Encounter (HOSPITAL_COMMUNITY): Payer: Self-pay | Admitting: Oncology

## 2017-05-27 ENCOUNTER — Encounter (HOSPITAL_BASED_OUTPATIENT_CLINIC_OR_DEPARTMENT_OTHER): Payer: BLUE CROSS/BLUE SHIELD

## 2017-05-27 ENCOUNTER — Encounter (HOSPITAL_COMMUNITY): Payer: BLUE CROSS/BLUE SHIELD | Attending: Oncology

## 2017-05-27 ENCOUNTER — Encounter (HOSPITAL_COMMUNITY): Payer: Self-pay

## 2017-05-27 ENCOUNTER — Encounter (HOSPITAL_BASED_OUTPATIENT_CLINIC_OR_DEPARTMENT_OTHER): Payer: BLUE CROSS/BLUE SHIELD | Admitting: Oncology

## 2017-05-27 VITALS — BP 107/75 | HR 89 | Temp 97.9°F | Resp 18 | Wt 235.5 lb

## 2017-05-27 DIAGNOSIS — C7951 Secondary malignant neoplasm of bone: Secondary | ICD-10-CM | POA: Diagnosis not present

## 2017-05-27 DIAGNOSIS — C61 Malignant neoplasm of prostate: Secondary | ICD-10-CM

## 2017-05-27 DIAGNOSIS — G893 Neoplasm related pain (acute) (chronic): Secondary | ICD-10-CM | POA: Diagnosis not present

## 2017-05-27 DIAGNOSIS — Z23 Encounter for immunization: Secondary | ICD-10-CM | POA: Diagnosis not present

## 2017-05-27 LAB — CBC WITH DIFFERENTIAL/PLATELET
Basophils Absolute: 0 10*3/uL (ref 0.0–0.1)
Basophils Relative: 0 %
EOS ABS: 0.2 10*3/uL (ref 0.0–0.7)
Eosinophils Relative: 3 %
HCT: 42.3 % (ref 39.0–52.0)
HEMOGLOBIN: 14.3 g/dL (ref 13.0–17.0)
LYMPHS ABS: 1 10*3/uL (ref 0.7–4.0)
LYMPHS PCT: 19 %
MCH: 30.7 pg (ref 26.0–34.0)
MCHC: 33.8 g/dL (ref 30.0–36.0)
MCV: 90.8 fL (ref 78.0–100.0)
Monocytes Absolute: 0.6 10*3/uL (ref 0.1–1.0)
Monocytes Relative: 11 %
NEUTROS ABS: 3.5 10*3/uL (ref 1.7–7.7)
NEUTROS PCT: 67 %
Platelets: 177 10*3/uL (ref 150–400)
RBC: 4.66 MIL/uL (ref 4.22–5.81)
RDW: 13.8 % (ref 11.5–15.5)
WBC: 5.2 10*3/uL (ref 4.0–10.5)

## 2017-05-27 LAB — COMPREHENSIVE METABOLIC PANEL
ALK PHOS: 67 U/L (ref 38–126)
ALT: 17 U/L (ref 17–63)
AST: 17 U/L (ref 15–41)
Albumin: 4 g/dL (ref 3.5–5.0)
Anion gap: 9 (ref 5–15)
BUN: 14 mg/dL (ref 6–20)
CALCIUM: 10.4 mg/dL — AB (ref 8.9–10.3)
CO2: 27 mmol/L (ref 22–32)
CREATININE: 1.16 mg/dL (ref 0.61–1.24)
Chloride: 103 mmol/L (ref 101–111)
GFR calc non Af Amer: >60 mL/min (ref >60)
GLUCOSE: 97 mg/dL (ref 65–99)
Potassium: 4.5 mmol/L (ref 3.5–5.1)
SODIUM: 139 mmol/L (ref 135–145)
Total Bilirubin: 0.6 mg/dL (ref 0.3–1.2)
Total Protein: 7.1 g/dL (ref 6.5–8.1)

## 2017-05-27 MED ORDER — DENOSUMAB 120 MG/1.7ML ~~LOC~~ SOLN
120.0000 mg | Freq: Once | SUBCUTANEOUS | Status: AC
  Administered 2017-05-27: 120 mg via SUBCUTANEOUS
  Filled 2017-05-27: qty 1.7

## 2017-05-27 MED ORDER — FENTANYL 75 MCG/HR TD PT72: 75.0000 ug | MEDICATED_PATCH | TRANSDERMAL | 0 refills | Status: DC

## 2017-05-27 MED ORDER — INFLUENZA VAC SPLIT QUAD 0.5 ML IM SUSY
PREFILLED_SYRINGE | INTRAMUSCULAR | Status: AC
  Filled 2017-05-27: qty 0.5

## 2017-05-27 MED ORDER — INFLUENZA VAC SPLIT QUAD 0.5 ML IM SUSY
0.5000 mL | PREFILLED_SYRINGE | Freq: Once | INTRAMUSCULAR | Status: AC
  Administered 2017-05-27: 0.5 mL via INTRAMUSCULAR

## 2017-05-27 NOTE — Progress Notes (Signed)
Matthew [name] tolerated Xgeva and Influenza vaccine well without complaints or incident. Calcium 10.4 today and pt denies any tooth,jaw or leg pain and recent or future dental appts prior to receiving the Xgeva injection. Pt reports he continues to take his Calcium pills as prescribed. Pt discharged self ambulatory in satisfactory condition

## 2017-05-27 NOTE — Progress Notes (Signed)
Matthew [name], [place] [address]  No diagnosis found.  CURRENT THERAPY:  [name] + [name]  INTERVAL HISTORY: Matthew [name] 61 y.o. male returns for followup of Stage IV prostate cancer with osseous involvement, S/P 6 cycles of Taxotere/Prednisone (01/07/2016- 04/11/2016), and [name] monthly beginning on 12/28/2015.  Now S/P palliative XRT to osseous lesion(s)    Prostate carcinoma ([place])   12/10/2015 Imaging    CTA chest, multiple thoracic and rib osseous lesions. no primary evident. Atherosclerosis including aortic and CAD      12/12/2015 Imaging    CT abdomen/pelvis with sclerotic osseous lesions througout the lumbar spine, sacrum, bilateral iliac bones and L proximal femur worrisome for sclerotic osseous mets,mild prostatomegaly, no LAD, Infrarenal 3.8 cm AAA      12/12/2015 Tumor Marker    PSA 153.85      12/18/2015 Imaging    Bone Scan Diffuse bony metastatic disease, posterior calvarium, sternum, thoracic, lumbar spine, bilateral ribs, L shoulder, bilateral bony pelvis, proximal L femur      12/24/2015 Initial Biopsy    CT biopsy of L iliac bone lesion      12/26/2015 Pathology Results    Bone, biopsy, left iliac - POSITIVE FOR METASTATIC ADENOCARCINOMA.      12/28/2015 - 09/01/2016 Chemotherapy    Firmagon instituted 240 mg       01/01/2016 Procedure    Port placed by IR.      01/07/2016 - 04/11/2016 Chemotherapy    Docetaxel every 21 days x 6 cycles with dose reductions due to tolerance      02/18/2016 Adverse Reaction    GI toxicity from docetaxel, dose reduced to 60 mg/m2      02/18/2016 Treatment Plan Change    Docetaxel dose reduced by 20%      03/06/2016 Procedure    Dr. Enrique Sack- Multiple extraction of tooth numbers 3, 5, 6, 8, 9, 10, 11, 12, 20, 21, 22, 27, 28, and 31. 4 Quadrants of alveoloplasty. Bilateral mandibular lingual tori reductions.      04/09/2016 Imaging    Brain MRI 1. No intracranial metastatic disease 2. No  acute intracranial abnormality 3. Findings of chronic microvascular disease      05/14/2016 Imaging    CT C/A/P Stable appearing diffuse bony metastatic disease. No new significant disease is seen Innumerable patchy sclerotic osseous metastases throughout the axial and proximal appendicular skeleton, stable in size and distribution, significantly increased in density in the interval, likely reflecting treatment effect. 2. No new or progressive metastatic disease in the chest, abdomen or pelvis. 3. Bilateral perifissural pulmonary nodules are stable and probably benign. 4. Aortic atherosclerosis. Infrarenal 4.3 cm abdominal aortic aneurysm, minimally increased in size. Recommend followup by ultrasound in 1 year.      06/02/2016 Tumor Marker    PSA 0.99 ng/ml       - 07/31/2016 Radiation Therapy    Palliative XRT by Dr. Lianne Cure in [place].       Chemotherapy    Depo-[name] every 3 months, beginning in Feb 2018       Patient presents today for continued follow-up. He complains of chronic pain in his back, knees, and hips, which is overall the same but exacerbated by activity.  He states that sometimes he tries to take extra pain medication when he is having a very bad day with pain, however sometimes makes him feel nauseated.  His appetite is fair.  He denies any weight loss.  He denies any chest pain, shortness breath, abdominal pain, focal weakness.  He states that his chronic fatigue is the same.  He complains about weakness of stream in his urine.  Review of Systems  Constitutional: Positive for malaise/fatigue. Negative for weight loss.  HENT: Negative.   Eyes: Negative.   Respiratory: Negative for shortness of breath.   Cardiovascular: Negative.  Negative for chest pain.  Gastrointestinal: Negative for abdominal pain. Constipation: Ongoing, unchanged.  Genitourinary: Negative.        Weakness of stream of urine  Musculoskeletal: Negative for back pain (8/10 in severity to the  thoracic spine.).  Skin: Negative.   Neurological: Negative.   Endo/Heme/Allergies: Negative.   Psychiatric/Behavioral: The patient does not have insomnia.   All other systems reviewed and are negative.   Past Medical History:  Diagnosis Date  . Arthritis    Left knee  . Bone metastases ([facility]) 12/26/2015  . Cancer [facility])    Prostate  . DVT (deep venous thrombosis) ([facility]) 2004  . Kidney stones    ~2002  . Prostate carcinoma ([place]) 12/26/2015    Past Surgical History:  Procedure Laterality Date  . CIRCUMCISION  30 years ago  . MULTIPLE EXTRACTIONS WITH ALVEOLOPLASTY N/A 03/06/2016   Procedure: Extraction of tooth #'s 3,5,6,8-12,20-22, 27, 28, and 31 with alveoloplasty and bilateral mandibular tori reductions;  Surgeon: Lenn Cal, DDS;  Location: [facility];  Service: Oral Surgery;  Laterality: N/A;    Family History  Problem Relation Age of Onset  . Cancer Mother     Social History   Social History  . Marital status: Single    Spouse name: N/A  . Number of children: 2  . Years of education: N/A   Occupational History  . Construction/carpenter    Social History Main Topics  . Smoking status: Current Every Day Smoker    Packs/day: 1.00    Years: 50.00    Types: Cigarettes    Start date: 06/16/1965  . Smokeless tobacco: Former Systems developer    Types: Chew    Quit date: 08/04/2005  . Alcohol use No  . Drug use: Yes    Types: Marijuana     Comment: THC for appetite  . Sexual activity: Not on file   Other Topics Concern  . Not on file   Social History Narrative  . No narrative on file     PHYSICAL EXAMINATION  ECOG PERFORMANCE STATUS: 1 - Symptomatic but completely ambulatory  Vitals:   05/27/17 1358  BP: 107/75  Pulse: 89  Resp: 18  Temp: 97.9 F (36.6 C)  SpO2: 100%    Physical Exam  Constitutional: He is oriented to person, place, and time and well-developed, well-nourished, and in no distress.  HENT:  Head: Normocephalic and atraumatic.  Eyes: Pupils  are equal, round, and reactive to light. Conjunctivae and EOM are normal.  Neck: Normal range of motion. Neck supple.  Cardiovascular: Normal rate, regular rhythm and normal heart sounds.  Exam reveals no gallop and no friction rub.   No murmur heard. Pulmonary/Chest: Effort normal and breath sounds normal. No respiratory distress. He has no wheezes. He has no rales. He exhibits no tenderness.  Abdominal: Soft. Bowel sounds are normal. He exhibits no distension. There is no tenderness. There is no rebound.  Musculoskeletal: Normal range of motion.  Neurological: He is alert and oriented to person, place, and time. Gait normal.  Skin: Skin is warm and dry.  Nursing note and vitals reviewed.  LABORATORY DATA: I have reviewed the data as follows. CBC    Component Value Date/Time   WBC 5.2 05/27/2017 1327   RBC 4.66 05/27/2017 1327   HGB 14.3 05/27/2017 1327   HCT 42.3 05/27/2017 1327   PLT 177 05/27/2017 1327   MCV 90.8 05/27/2017 1327   MCH 30.7 05/27/2017 1327   MCHC 33.8 05/27/2017 1327   RDW 13.8 05/27/2017 1327   LYMPHSABS 1.0 05/27/2017 1327   MONOABS 0.6 05/27/2017 1327   EOSABS 0.2 05/27/2017 1327   BASOSABS 0.0 05/27/2017 1327      Chemistry      Component Value Date/Time   NA 139 04/29/2017 1010   K 4.0 04/29/2017 1010   CL 101 04/29/2017 1010   CO2 26 04/29/2017 1010   BUN 19 04/29/2017 1010   CREATININE 1.15 04/29/2017 1010      Component Value Date/Time   CALCIUM 10.6 (H) 04/29/2017 1010   ALKPHOS 57 04/29/2017 1010   AST 19 04/29/2017 1010   ALT 19 04/29/2017 1010   BILITOT 0.6 04/29/2017 1010      PSA 09/01/16: 0.43 PSA 09/29/16: 0.28   LABS:  Ref Range & Units 25mo ago 45mo ago 33mo ago 26mo ago    PSA 0.00 - 4.00 ng/mL 0.28  0.43CM  0.84CM  1.05CM      RADIOGRAPHIC STUDIES: I have personally reviewed the radiological images as listed and agreed with the findings in the report. No results found.   PATHOLOGY:    ASSESSMENT AND PLAN:  Stage  IV prostate cancer with osseous involvement, S/P 6 cycles of Taxotere/Prednisone (01/07/2016- 04/11/2016), and [name] monthly beginning on 12/28/2015.  Now S/P palliative XRT to osseous lesion(s). Started on depo-lupron q 3 months in Feb 2018. Chronic pain syndrome from bone mets.  PLAN: - PSA has been relatively stable. Will f/u on today's PSA. - Refilled fentanyl patch today. - Continue xgeva q4 weeks for bone mets. Injection today. - Continue calcium supplement for hypocalcemia. - Port flush q8 weeks  - Continue lupron q 3 months. - Annual flu vaccine given today. - RTC in 3 months for follow up with testorsterone, PSA, CBC, CMP at that time.   ORDERS PLACED FOR THIS ENCOUNTER: No orders of the defined types were placed in this encounter.   MEDICATIONS PRESCRIBED THIS ENCOUNTER: Meds ordered this encounter  Medications  . Influenza vac split quadrivalent PF (FLUARIX) injection 0.5 mL   All questions were answered. The patient knows to call the clinic with any problems, questions or concerns. We can certainly see the patient much sooner if necessary.   This note is electronically signed by:  Twana First, MD 05/27/2017 2:01 PM

## 2017-05-27 NOTE — Patient Instructions (Addendum)
[place] at [facility] Discharge Instructions  RECOMMENDATIONS MADE BY THE CONSULTANT AND ANY TEST RESULTS WILL BE SENT TO YOUR REFERRING PHYSICIAN.  You were seen today by Dr. Twana First You will get your Xgeva injection today Continue port flush every 2 months lupron injection every 3 months Get your xgeva monthly Follow up in 3 months with labs  Thank you for choosing [place] at [facility] to provide your oncology and hematology care.  To afford each patient quality time with our provider, please arrive at least 15 minutes before your scheduled appointment time.    If you have a lab appointment with the [facility] please come in thru the  Main Entrance and check in at the main information desk  You need to re-schedule your appointment should you arrive 10 or more minutes late.  We strive to give you quality time with our providers, and arriving late affects you and other patients whose appointments are after yours.  Also, if you no show three or more times for appointments you may be dismissed from the clinic at the providers discretion.     Again, thank you for choosing [facility].  Our hope is that these requests will decrease the amount of time that you wait before being seen by our physicians.       _____________________________________________________________  Should you have questions after your visit to [facility], please contact our office at [phone] between the hours of 8:30 a.m. and 4:30 p.m.  Voicemails left after 4:30 p.m. will not be returned until the following business day.  For prescription refill requests, have your pharmacy contact our office.       Resources For Cancer Patients and their Caregivers ? American Cancer Society: Can assist with transportation, wigs, general needs, runs Look Good Feel Better.        603-489-2725 ? Cancer Care: Provides financial assistance,  online support groups, medication/co-pay assistance.  1-800-813-HOPE 641-836-4129) ? [place] Assists [place] cancer patients and their families through emotional , educational and financial support.  (952)430-0966 ? [place] DSS Where to apply for food stamps, Medicaid and utility assistance. (702)281-7065 ? RCATS: Transportation to medical appointments. 973 393 3925 ? Social Security Administration: May apply for disability if have a Stage IV cancer. 408-470-7456 6716318968 ? [employer], Disability and Transit Services: Assists with nutrition, care and transit needs. [place] Support Programs: @10RELATIVEDAYS @ > Cancer Support Group  2nd Tuesday of the month 1pm-2pm, Journey Room  > Creative Journey  3rd Tuesday of the month 1130am-1pm, Journey Room  > Look Good Feel Better  1st Wednesday of the month 10am-12 noon, Journey Room (Call [place] to register [phone])

## 2017-05-27 NOTE — Patient Instructions (Signed)
[place] at [facility] Discharge Instructions  RECOMMENDATIONS MADE BY THE CONSULTANT AND ANY TEST RESULTS WILL BE SENT TO YOUR REFERRING PHYSICIAN.  Received Xgeva injection and Influenza vaccine today. Follow-up as scheduled. Call clinic for any questions or concerns  Thank you for choosing [place] at [facility] to provide your oncology and hematology care.  To afford each patient quality time with our provider, please arrive at least 15 minutes before your scheduled appointment time.    If you have a lab appointment with the [facility] please come in thru the  Main Entrance and check in at the main information desk  You need to re-schedule your appointment should you arrive 10 or more minutes late.  We strive to give you quality time with our providers, and arriving late affects you and other patients whose appointments are after yours.  Also, if you no show three or more times for appointments you may be dismissed from the clinic at the providers discretion.     Again, thank you for choosing [facility].  Our hope is that these requests will decrease the amount of time that you wait before being seen by our physicians.       _____________________________________________________________  Should you have questions after your visit to [facility], please contact our office at [phone] between the hours of 8:30 a.m. and 4:30 p.m.  Voicemails left after 4:30 p.m. will not be returned until the following business day.  For prescription refill requests, have your pharmacy contact our office.       Resources For Cancer Patients and their Caregivers ? American Cancer Society: Can assist with transportation, wigs, general needs, runs Look Good Feel Better.        317-254-0457 ? Cancer Care: Provides financial assistance, online support groups, medication/co-pay assistance.  1-800-813-HOPE 936-030-9691) ? [place] Assists [place] cancer patients and their families through emotional , educational and financial support.  (540)131-8359 ? [place] DSS Where to apply for food stamps, Medicaid and utility assistance. (240)252-5443 ? RCATS: Transportation to medical appointments. 6198156450 ? Social Security Administration: May apply for disability if have a Stage IV cancer. 206 006 9400 231-546-0413 ? [employer], Disability and Transit Services: Assists with nutrition, care and transit needs. [place] Support Programs: @10RELATIVEDAYS @ > Cancer Support Group  2nd Tuesday of the month 1pm-2pm, Journey Room  > Creative Journey  3rd Tuesday of the month 1130am-1pm, Journey Room  > Look Good Feel Better  1st Wednesday of the month 10am-12 noon, Journey Room (Call [place] to register [phone])

## 2017-06-24 ENCOUNTER — Encounter (HOSPITAL_BASED_OUTPATIENT_CLINIC_OR_DEPARTMENT_OTHER): Payer: BLUE CROSS/BLUE SHIELD

## 2017-06-24 ENCOUNTER — Encounter (HOSPITAL_COMMUNITY): Payer: BLUE CROSS/BLUE SHIELD | Attending: Oncology

## 2017-06-24 ENCOUNTER — Encounter (HOSPITAL_COMMUNITY): Payer: Self-pay

## 2017-06-24 VITALS — BP 107/77 | HR 84 | Temp 98.2°F | Resp 18 | Wt 239.4 lb

## 2017-06-24 DIAGNOSIS — C61 Malignant neoplasm of prostate: Secondary | ICD-10-CM | POA: Diagnosis not present

## 2017-06-24 DIAGNOSIS — C7951 Secondary malignant neoplasm of bone: Secondary | ICD-10-CM | POA: Diagnosis not present

## 2017-06-24 LAB — COMPREHENSIVE METABOLIC PANEL
ALT: 16 U/L — AB (ref 17–63)
AST: 21 U/L (ref 15–41)
Albumin: 3.7 g/dL (ref 3.5–5.0)
Alkaline Phosphatase: 62 U/L (ref 38–126)
Anion gap: 8 (ref 5–15)
BILIRUBIN TOTAL: 0.5 mg/dL (ref 0.3–1.2)
BUN: 12 mg/dL (ref 6–20)
CHLORIDE: 104 mmol/L (ref 101–111)
CO2: 24 mmol/L (ref 22–32)
CREATININE: 1 mg/dL (ref 0.61–1.24)
Calcium: 9.2 mg/dL (ref 8.9–10.3)
Glucose, Bld: 123 mg/dL — ABNORMAL HIGH (ref 65–99)
POTASSIUM: 4 mmol/L (ref 3.5–5.1)
Sodium: 136 mmol/L (ref 135–145)
TOTAL PROTEIN: 6.8 g/dL (ref 6.5–8.1)

## 2017-06-24 LAB — CBC WITH DIFFERENTIAL/PLATELET
BASOS ABS: 0 10*3/uL (ref 0.0–0.1)
Basophils Relative: 1 %
EOS PCT: 4 %
Eosinophils Absolute: 0.2 10*3/uL (ref 0.0–0.7)
HEMATOCRIT: 39.1 % (ref 39.0–52.0)
Hemoglobin: 12.9 g/dL — ABNORMAL LOW (ref 13.0–17.0)
LYMPHS ABS: 0.8 10*3/uL (ref 0.7–4.0)
LYMPHS PCT: 20 %
MCH: 29.9 pg (ref 26.0–34.0)
MCHC: 33 g/dL (ref 30.0–36.0)
MCV: 90.7 fL (ref 78.0–100.0)
MONO ABS: 0.4 10*3/uL (ref 0.1–1.0)
Monocytes Relative: 11 %
NEUTROS ABS: 2.5 10*3/uL (ref 1.7–7.7)
Neutrophils Relative %: 64 %
PLATELETS: 176 10*3/uL (ref 150–400)
RBC: 4.31 MIL/uL (ref 4.22–5.81)
RDW: 13.4 % (ref 11.5–15.5)
WBC: 3.9 10*3/uL — ABNORMAL LOW (ref 4.0–10.5)

## 2017-06-24 LAB — PSA: PROSTATIC SPECIFIC ANTIGEN: 1.18 ng/mL (ref 0.00–4.00)

## 2017-06-24 MED ORDER — HYDROCODONE-ACETAMINOPHEN 10-325 MG PO TABS: 1.0000 | ORAL_TABLET | ORAL | 0 refills | Status: DC | PRN

## 2017-06-24 MED ORDER — DENOSUMAB 120 MG/1.7ML ~~LOC~~ SOLN
120.0000 mg | Freq: Once | SUBCUTANEOUS | Status: AC
  Administered 2017-06-24: 120 mg via SUBCUTANEOUS
  Filled 2017-06-24: qty 1.7

## 2017-06-24 MED ORDER — FENTANYL 75 MCG/HR TD PT72: 75.0000 ug | MEDICATED_PATCH | TRANSDERMAL | 0 refills | Status: DC

## 2017-06-24 NOTE — Progress Notes (Signed)
1400 Labs reviewed, Calcium 9.2 today, and pt denied any tooth,jaw or leg pain or any recent or future dental appts prior to administering the Xgeva injection.Pt tolerated the Xgeva injection well without complaints or issues. VSS Pt discharged self ambulatory in satisfactory condition

## 2017-06-24 NOTE — Patient Instructions (Signed)
[facility] at [facility] Discharge Instructions  RECOMMENDATIONS MADE BY THE CONSULTANT AND ANY TEST RESULTS WILL BE SENT TO YOUR REFERRING PHYSICIAN.  Received Xgeva injection today. Follow-up as scheduled. Call clinic for any questions or concerns  Thank you for choosing [facility] at [facility] to provide your oncology and hematology care.  To afford each patient quality time with our provider, please arrive at least 15 minutes before your scheduled appointment time.    If you have a lab appointment with the Cancer Center please come in thru the  Main Entrance and check in at the main information desk  You need to re-schedule your appointment should you arrive 10 or more minutes late.  We strive to give you quality time with our providers, and arriving late affects you and other patients whose appointments are after yours.  Also, if you no show three or more times for appointments you may be dismissed from the clinic at the providers discretion.     Again, thank you for choosing [facility].  Our hope is that these requests will decrease the amount of time that you wait before being seen by our physicians.       _____________________________________________________________  Should you have questions after your visit to [facility], please contact our office at [phone] between the hours of 8:30 a.m. and 4:30 p.m.  Voicemails left after 4:30 p.m. will not be returned until the following business day.  For prescription refill requests, have your pharmacy contact our office.       Resources For Cancer Patients and their Caregivers ? American Cancer Society: Can assist with transportation, wigs, general needs, runs Look Good Feel Better.        1-888-227-6333 ? Cancer Care: Provides financial assistance, online support groups, medication/co-pay assistance.  1-800-813-HOPE (4673) ? [facility] Assists [place] cancer patients and their families through emotional , educational and financial support.  336-427-4357 ? [place] DSS Where to apply for food stamps, Medicaid and utility assistance. 336-342-1394 ? RCATS: Transportation to medical appointments. 336-347-2287 ? Social Security Administration: May apply for disability if have a Stage IV cancer. 336-342-7796 1-800-772-1213 ? [place] Aging, Disability and Transit Services: Assists with nutrition, care and transit needs. 336-349-2343  Cancer Center Support Programs: @10RELATIVEDAYS@ > Cancer Support Group  2nd Tuesday of the month 1pm-2pm, Journey Room  > Creative Journey  3rd Tuesday of the month 1130am-1pm, Journey Room  > Look Good Feel Better  1st Wednesday of the month 10am-12 noon, Journey Room (Call [employer] to register 1-800-395-5775)   

## 2017-06-25 LAB — TESTOSTERONE: TESTOSTERONE: 11 ng/dL — AB (ref 264–916)

## 2017-06-28 IMAGING — CT CT BIOPSY
1 series · 22 of 28 positions shown, 29 images · non-contrast
Comparison: none

CLINICAL DATA: Multiple sclerotic osseous metastases

[Series 5: (hospital) 6.0 b30f · axial · 0.99mm/px · z∈[-50,-40]mm · 22 of 28 slices shown, 29 images]
[im 2/28  soft-tissue]
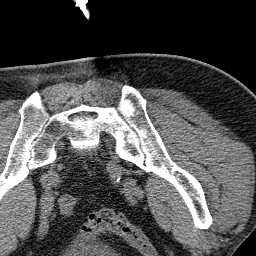
[im 2/28  bone]
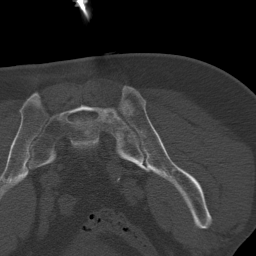
[im 3/28  soft-tissue]
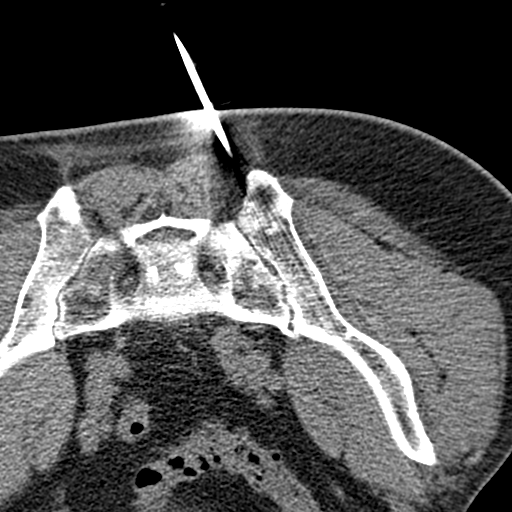
[im 4/28  soft-tissue]
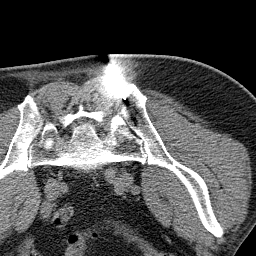
[im 6/28  soft-tissue]
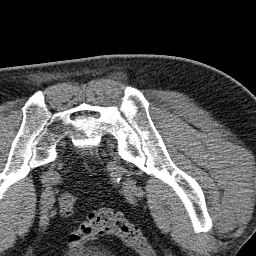
[im 7/28  soft-tissue]
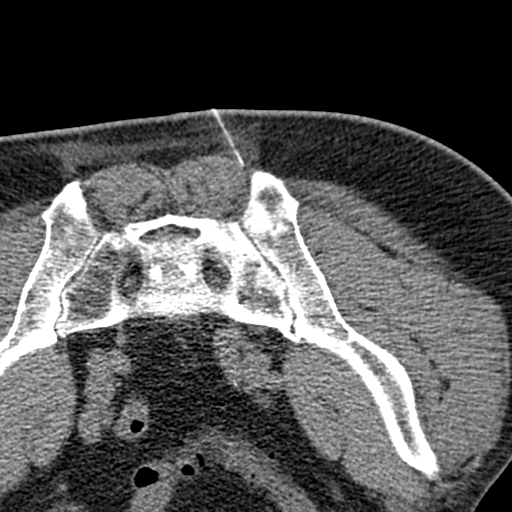
[im 8/28  soft-tissue]
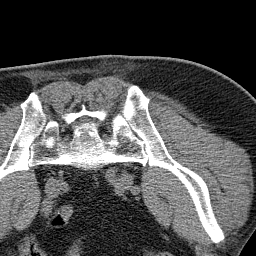
[im 9/28  soft-tissue]
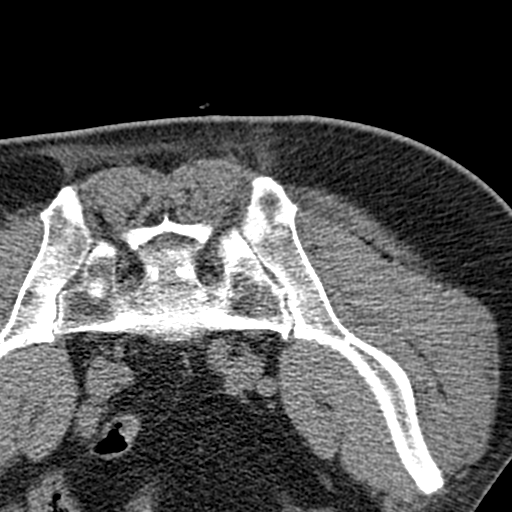
[im 10/28  soft-tissue]
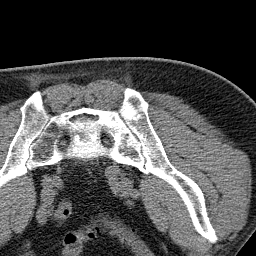
[im 12/28  soft-tissue]
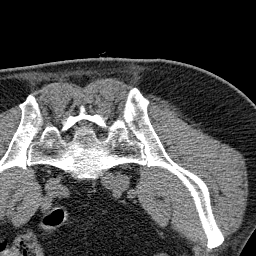
[im 12/28  bone]
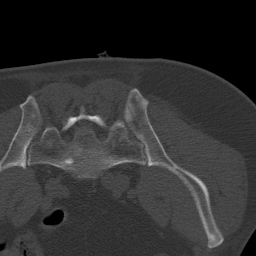
[im 13/28  soft-tissue]
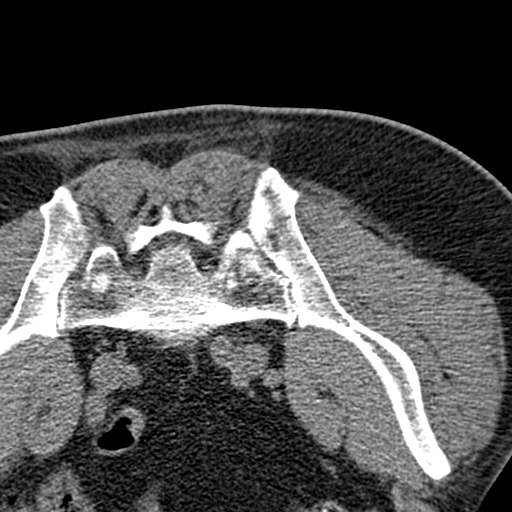
[im 13/28  lung]
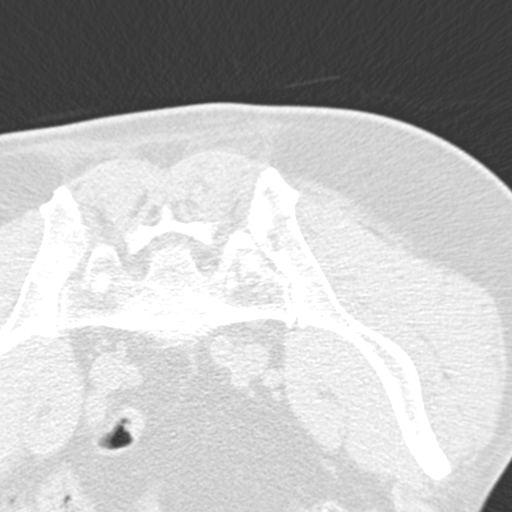
[im 14/28  soft-tissue]
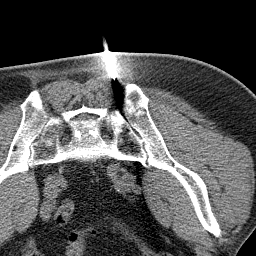
[im 14/28  lung]
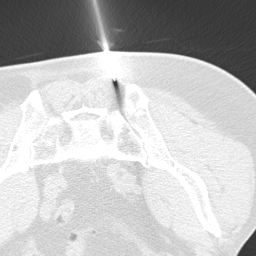
[im 15/28  soft-tissue]
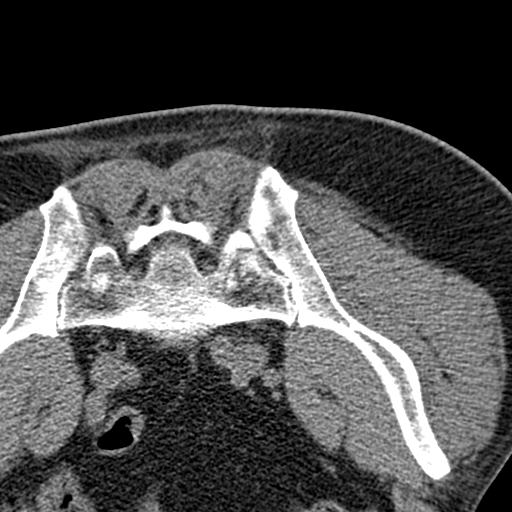
[im 15/28  lung]
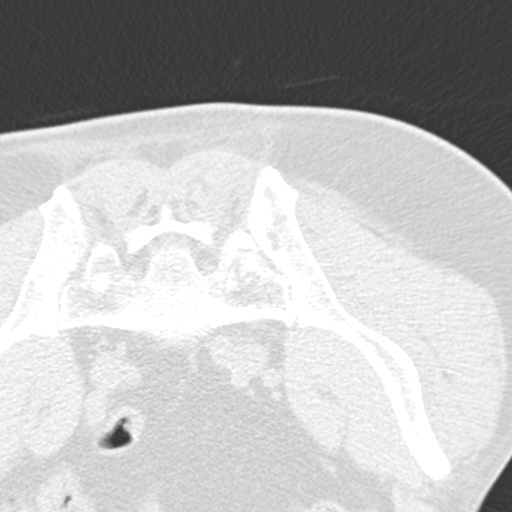
[im 16/28  soft-tissue]
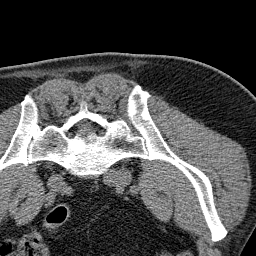
[im 16/28  lung]
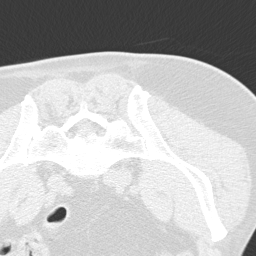
[im 17/28  soft-tissue]
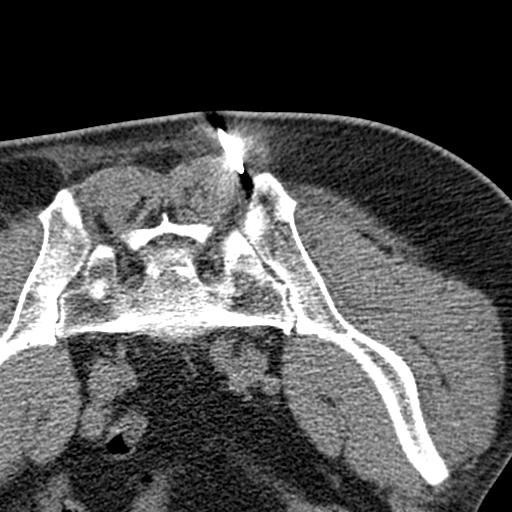
[im 19/28  soft-tissue]
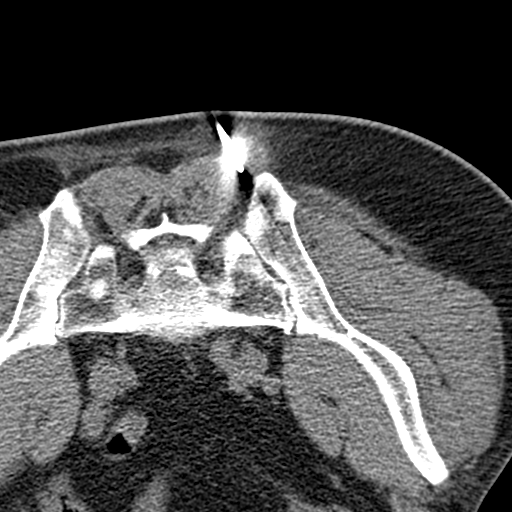
[im 20/28  soft-tissue]
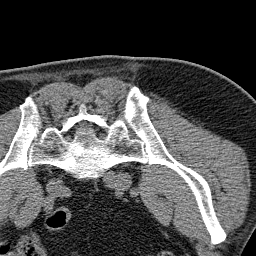
[im 21/28  soft-tissue]
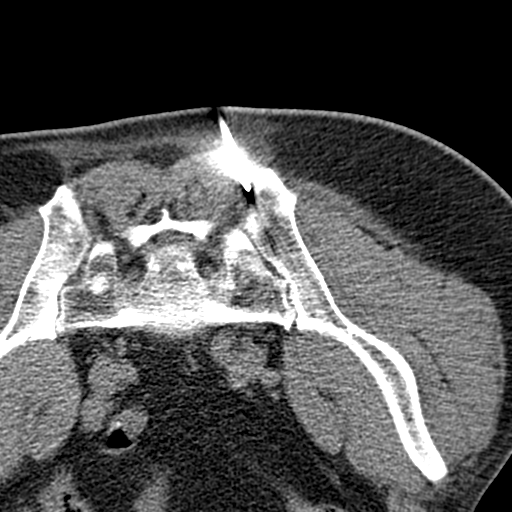
[im 21/28  bone]
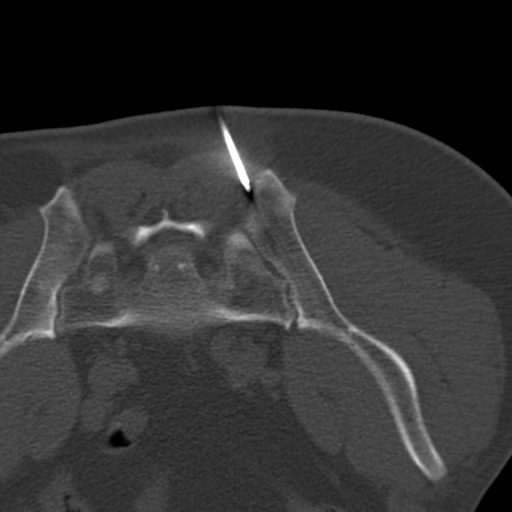
[im 22/28  soft-tissue]
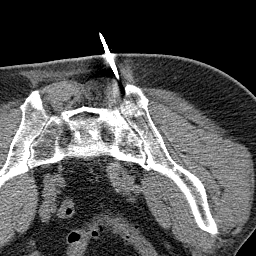
[im 23/28  soft-tissue]
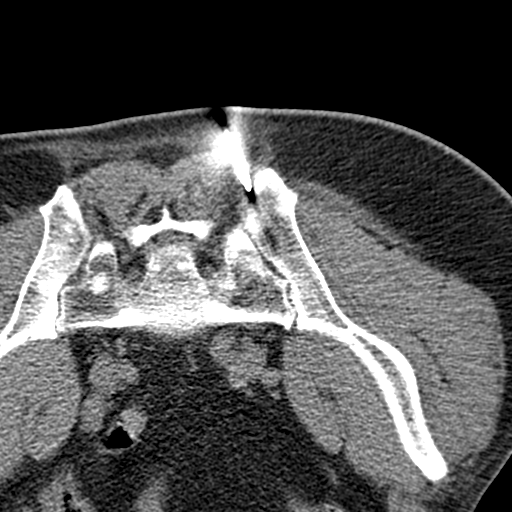
[im 25/28  soft-tissue]
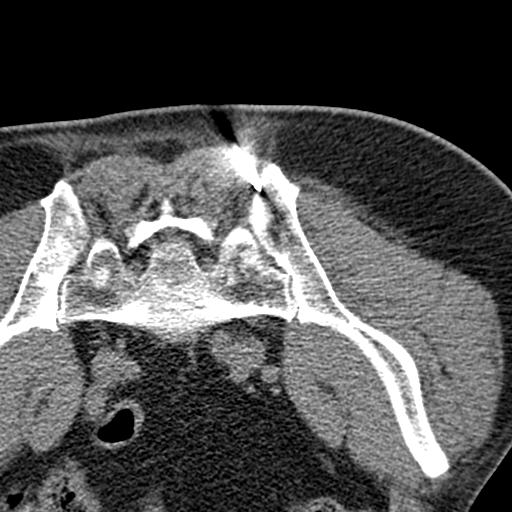
[im 26/28  soft-tissue]
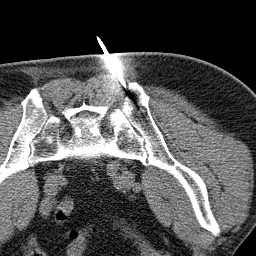
[im 27/28  soft-tissue]
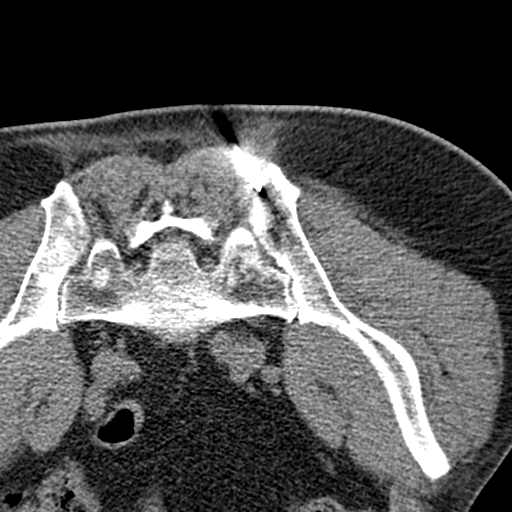

[22 of 28 positions shown; findings below may reference images not displayed]

EXAM:
CT GUIDED core BIOPSY OF left iliac bone lesion

ANESTHESIA/SEDATION:
Intravenous Fentanyl and Versed were administered as conscious
sedation during continuous monitoring of the patient's level of
consciousness and physiological / cardiorespiratory status by the
radiology RN, with a total moderate sedation time of 13 minutes.

PROCEDURE:
The procedure risks, benefits, and alternatives were explained to
the patient. Questions regarding the procedure were encouraged and
answered. The patient understands and consents to the procedure.

Patient placed prone. Select axial scans through the pelvis obtained
and a representative left iliac sclerotic lesion was identified, and
an appropriate skin entry site determined and marked.

The operative field was prepped with chlorhexidinein a sterile
fashion, and a sterile drape was applied covering the operative
field. A sterile gown and sterile gloves were used for the
procedure. Local anesthesia was provided with 1% Lidocaine.

Under CT fluoroscopic guidance, 11 gauge OnControl needle was
advanced to the margin of the lesion. Core biopsy sample obtained ,
submitted in formalin to surgical pathology. Postprocedure scan
shows no hemorrhage or other apparent complication. The patient
tolerated the procedure well.

COMPLICATIONS:
None immediate
FINDINGS: Sclerotic sacral and bilateral iliac bone lesions were identified.
CT-guided core biopsy of a representative left iliac lesion obtained
as above.
IMPRESSION: 1. Technically successful CT-guided core biopsy of left iliac bone
lesion.

## 2017-07-02 ENCOUNTER — Encounter (HOSPITAL_COMMUNITY): Payer: Self-pay

## 2017-07-02 ENCOUNTER — Encounter (HOSPITAL_BASED_OUTPATIENT_CLINIC_OR_DEPARTMENT_OTHER): Payer: BLUE CROSS/BLUE SHIELD

## 2017-07-02 VITALS — BP 102/57 | HR 109 | Temp 98.4°F | Resp 18

## 2017-07-02 DIAGNOSIS — Z5111 Encounter for antineoplastic chemotherapy: Secondary | ICD-10-CM

## 2017-07-02 DIAGNOSIS — C61 Malignant neoplasm of prostate: Secondary | ICD-10-CM

## 2017-07-02 DIAGNOSIS — C7951 Secondary malignant neoplasm of bone: Secondary | ICD-10-CM

## 2017-07-02 MED ORDER — LEUPROLIDE ACETATE (3 MONTH) 22.5 MG IM KIT
22.5000 mg | PACK | Freq: Once | INTRAMUSCULAR | Status: AC
  Administered 2017-07-02: 22.5 mg via INTRAMUSCULAR
  Filled 2017-07-02: qty 22.5

## 2017-07-02 NOTE — Patient Instructions (Signed)
[place] at [facility]  Discharge Instructions:  You received a lupron shot today.  _______________________________________________________________  Thank you for choosing [place] at [facility] to provide your oncology and hematology care.  To afford each patient quality time with our providers, please arrive at least 15 minutes before your scheduled appointment.  You need to re-schedule your appointment if you arrive 10 or more minutes late.  We strive to give you quality time with our providers, and arriving late affects you and other patients whose appointments are after yours.  Also, if you no show three or more times for appointments you may be dismissed from the clinic.  Again, thank you for choosing [place] at [place] hope is that these requests will allow you access to exceptional care and in a timely manner. _______________________________________________________________  If you have questions after your visit, please contact our office at [phone] between the hours of 8:30 a.m. and 5:00 p.m. Voicemails left after 4:30 p.m. will not be returned until the following business day. _______________________________________________________________  For prescription refill requests, have your pharmacy contact our office. _______________________________________________________________  Recommendations made by the consultant and any test results will be sent to your referring physician. _______________________________________________________________

## 2017-07-02 NOTE — Progress Notes (Signed)
Patient tolerated lupron with no complaints of pain voiced.  Injection site clean and dry with no bruising or swelling noted at site.  Band aid applied.  VSs with discharge and left ambulatory.

## 2017-07-22 ENCOUNTER — Encounter (HOSPITAL_BASED_OUTPATIENT_CLINIC_OR_DEPARTMENT_OTHER): Payer: BLUE CROSS/BLUE SHIELD

## 2017-07-22 ENCOUNTER — Encounter (HOSPITAL_COMMUNITY): Payer: Self-pay

## 2017-07-22 ENCOUNTER — Encounter (HOSPITAL_COMMUNITY): Payer: BLUE CROSS/BLUE SHIELD | Attending: Oncology

## 2017-07-22 VITALS — BP 121/70 | HR 79 | Temp 97.9°F | Resp 20 | Wt 242.6 lb

## 2017-07-22 DIAGNOSIS — C61 Malignant neoplasm of prostate: Secondary | ICD-10-CM

## 2017-07-22 DIAGNOSIS — C7951 Secondary malignant neoplasm of bone: Secondary | ICD-10-CM

## 2017-07-22 LAB — CBC WITH DIFFERENTIAL/PLATELET
Basophils Absolute: 0 10*3/uL (ref 0.0–0.1)
Basophils Relative: 0 %
Eosinophils Absolute: 0.1 10*3/uL (ref 0.0–0.7)
Eosinophils Relative: 2 %
HCT: 41.2 % (ref 39.0–52.0)
Hemoglobin: 13.5 g/dL (ref 13.0–17.0)
Lymphocytes Relative: 18 %
Lymphs Abs: 0.9 10*3/uL (ref 0.7–4.0)
MCH: 29.9 pg (ref 26.0–34.0)
MCHC: 32.8 g/dL (ref 30.0–36.0)
MCV: 91.4 fL (ref 78.0–100.0)
Monocytes Absolute: 0.3 10*3/uL (ref 0.1–1.0)
Monocytes Relative: 6 %
Neutro Abs: 3.5 10*3/uL (ref 1.7–7.7)
Neutrophils Relative %: 74 %
Platelets: 178 10*3/uL (ref 150–400)
RBC: 4.51 MIL/uL (ref 4.22–5.81)
RDW: 13.3 % (ref 11.5–15.5)
WBC: 4.8 10*3/uL (ref 4.0–10.5)

## 2017-07-22 LAB — COMPREHENSIVE METABOLIC PANEL
ALBUMIN: 3.6 g/dL (ref 3.5–5.0)
ALT: 14 U/L — ABNORMAL LOW (ref 17–63)
ANION GAP: 11 (ref 5–15)
AST: 19 U/L (ref 15–41)
Alkaline Phosphatase: 58 U/L (ref 38–126)
BILIRUBIN TOTAL: 0.5 mg/dL (ref 0.3–1.2)
BUN: 10 mg/dL (ref 6–20)
CO2: 22 mmol/L (ref 22–32)
Calcium: 8.9 mg/dL (ref 8.9–10.3)
Chloride: 108 mmol/L (ref 101–111)
Creatinine, Ser: 0.91 mg/dL (ref 0.61–1.24)
GFR calc Af Amer: >60 mL/min (ref >60)
GLUCOSE: 124 mg/dL — AB (ref 65–99)
POTASSIUM: 4 mmol/L (ref 3.5–5.1)
Sodium: 141 mmol/L (ref 135–145)
TOTAL PROTEIN: 6.8 g/dL (ref 6.5–8.1)

## 2017-07-22 MED ORDER — HYDROCODONE-ACETAMINOPHEN 10-325 MG PO TABS: 1.0000 | ORAL_TABLET | ORAL | 0 refills | Status: DC | PRN

## 2017-07-22 MED ORDER — DENOSUMAB 120 MG/1.7ML ~~LOC~~ SOLN
120.0000 mg | Freq: Once | SUBCUTANEOUS | Status: AC
  Administered 2017-07-22: 120 mg via SUBCUTANEOUS
  Filled 2017-07-22: qty 1.7

## 2017-07-22 MED ORDER — HEPARIN SOD (PORK) LOCK FLUSH 100 UNIT/ML IV SOLN
INTRAVENOUS | Status: AC
  Filled 2017-07-22: qty 5

## 2017-07-22 MED ORDER — HEPARIN SOD (PORK) LOCK FLUSH 100 UNIT/ML IV SOLN: 500.0000 [IU] | Freq: Once | INTRAVENOUS | Status: DC

## 2017-07-22 MED ORDER — FENTANYL 75 MCG/HR TD PT72: 75.0000 ug | MEDICATED_PATCH | TRANSDERMAL | 0 refills | Status: DC

## 2017-07-22 MED ORDER — SODIUM CHLORIDE 0.9% FLUSH: 10.0000 mL | Freq: Once | INTRAVENOUS | Status: DC

## 2017-07-22 NOTE — Progress Notes (Signed)
Patient for port flush and xgeva.  Patient forgot his EMLA cream and wants to wait until his next xgeva for port flush.    Patient tolerated xgeva shot with no complaints voiced.  Taking calcium as directed and denied tooth, jaw, and leg pain.  No recent or upcoming dental visits.  Injection site clean and dry with no bruising or swelling noted at site.  Band aid applied.  VSS with discharge and left ambulatory.

## 2017-07-22 NOTE — Patient Instructions (Signed)
[facility] at [facility]  Discharge Instructions: You received an xgeva shot today.  _______________________________________________________________  Thank you for choosing [facility] at [facility] to provide your oncology and hematology care.  To afford each patient quality time with our providers, please arrive at least 15 minutes before your scheduled appointment.  You need to re-schedule your appointment if you arrive 10 or more minutes late.  We strive to give you quality time with our providers, and arriving late affects you and other patients whose appointments are after yours.  Also, if you no show three or more times for appointments you may be dismissed from the clinic.  Again, thank you for choosing [facility] at [facility]. Our hope is that these requests will allow you access to exceptional care and in a timely manner. _______________________________________________________________  If you have questions after your visit, please contact our office at [phone] between the hours of 8:30 a.m. and 5:00 p.m. Voicemails left after 4:30 p.m. will not be returned until the following business day. _______________________________________________________________  For prescription refill requests, have your pharmacy contact our office. _______________________________________________________________  Recommendations made by the consultant and any test results will be sent to your referring physician. _______________________________________________________________ 

## 2017-08-05 ENCOUNTER — Other Ambulatory Visit: Payer: Self-pay | Admitting: Internal Medicine

## 2017-08-19 ENCOUNTER — Other Ambulatory Visit: Payer: Self-pay

## 2017-08-19 ENCOUNTER — Inpatient Hospital Stay (HOSPITAL_COMMUNITY): Payer: BLUE CROSS/BLUE SHIELD

## 2017-08-19 ENCOUNTER — Encounter (HOSPITAL_COMMUNITY): Payer: Self-pay | Admitting: Hematology and Oncology

## 2017-08-19 ENCOUNTER — Inpatient Hospital Stay (HOSPITAL_COMMUNITY): Payer: BLUE CROSS/BLUE SHIELD | Attending: Hematology and Oncology | Admitting: Hematology and Oncology

## 2017-08-19 VITALS — BP 116/73 | HR 83 | Temp 97.9°F | Resp 20 | Ht 73.0 in | Wt 246.6 lb

## 2017-08-19 DIAGNOSIS — C7951 Secondary malignant neoplasm of bone: Secondary | ICD-10-CM | POA: Diagnosis not present

## 2017-08-19 DIAGNOSIS — C61 Malignant neoplasm of prostate: Secondary | ICD-10-CM

## 2017-08-19 DIAGNOSIS — Z95828 Presence of other vascular implants and grafts: Secondary | ICD-10-CM

## 2017-08-19 LAB — CBC WITH DIFFERENTIAL/PLATELET
BASOS PCT: 0 %
Basophils Absolute: 0 10*3/uL (ref 0.0–0.1)
EOS PCT: 3 %
Eosinophils Absolute: 0.2 10*3/uL (ref 0.0–0.7)
HCT: 40.1 % (ref 39.0–52.0)
Hemoglobin: 12.8 g/dL — ABNORMAL LOW (ref 13.0–17.0)
LYMPHS ABS: 0.9 10*3/uL (ref 0.7–4.0)
Lymphocytes Relative: 19 %
MCH: 29.3 pg (ref 26.0–34.0)
MCHC: 31.9 g/dL (ref 30.0–36.0)
MCV: 91.8 fL (ref 78.0–100.0)
MONOS PCT: 7 %
Monocytes Absolute: 0.3 10*3/uL (ref 0.1–1.0)
Neutro Abs: 3.1 10*3/uL (ref 1.7–7.7)
Neutrophils Relative %: 71 %
Platelets: 186 10*3/uL (ref 150–400)
RBC: 4.37 MIL/uL (ref 4.22–5.81)
RDW: 13.5 % (ref 11.5–15.5)
WBC: 4.5 10*3/uL (ref 4.0–10.5)

## 2017-08-19 LAB — COMPREHENSIVE METABOLIC PANEL
ALBUMIN: 3.5 g/dL (ref 3.5–5.0)
ALT: 20 U/L (ref 17–63)
AST: 22 U/L (ref 15–41)
Alkaline Phosphatase: 56 U/L (ref 38–126)
Anion gap: 11 (ref 5–15)
BUN: 12 mg/dL (ref 6–20)
CALCIUM: 8.3 mg/dL — AB (ref 8.9–10.3)
CO2: 22 mmol/L (ref 22–32)
CREATININE: 1.06 mg/dL (ref 0.61–1.24)
Chloride: 108 mmol/L (ref 101–111)
GFR calc non Af Amer: >60 mL/min (ref >60)
GLUCOSE: 99 mg/dL (ref 65–99)
Potassium: 3.7 mmol/L (ref 3.5–5.1)
SODIUM: 141 mmol/L (ref 135–145)
TOTAL PROTEIN: 6.6 g/dL (ref 6.5–8.1)
Total Bilirubin: 0.3 mg/dL (ref 0.3–1.2)

## 2017-08-19 LAB — PSA: PROSTATIC SPECIFIC ANTIGEN: 2.83 ng/mL (ref 0.00–4.00)

## 2017-08-19 MED ORDER — HEPARIN SOD (PORK) LOCK FLUSH 100 UNIT/ML IV SOLN
500.0000 [IU] | Freq: Once | INTRAVENOUS | Status: AC
  Administered 2017-08-19: 500 [IU] via INTRAVENOUS

## 2017-08-19 MED ORDER — FENTANYL 75 MCG/HR TD PT72: 75.0000 ug | MEDICATED_PATCH | TRANSDERMAL | 0 refills | Status: DC

## 2017-08-19 MED ORDER — LACTULOSE 10 G PO PACK: 10.0000 g | PACK | Freq: Three times a day (TID) | ORAL | 0 refills | Status: DC | PRN

## 2017-08-19 NOTE — Progress Notes (Signed)
No XGEVA today due to corrected calcium of 8.7 per Dr. Lebron Conners. We will give XGEVA next month if calcium level is normal.   Leonette Most presented for Portacath access and flush. Portacath located rt chest wall accessed with  H 20 needle. Good blood return present. Portacath flushed with 40ml NS and 500U/69ml Heparin and needle removed intact. Procedure without incident. Patient tolerated procedure well.  Appts scheduled for Feb for [name], XGEVA, labs, port flush.  Patient instructed to resume taking Calcium and Vitamin D. Patient states that he ran out of Calcium on Sunday and that he hasn't taken the Vitamin D in a while. Patient given a prescription for Fentanyl and Dr. Lebron Conners called in a prescription for Lactulose for his constipation. I instructed pt to call us if he developed diarrhea so that we could instruct him on how to take Lactulose moving forward. He said okay to all instructions.  Patient discharged ambulatory to self and in stable condition.

## 2017-08-20 LAB — TESTOSTERONE: TESTOSTERONE: 20 ng/dL — AB (ref 264–916)

## 2017-09-01 NOTE — Progress Notes (Signed)
[facility] Follow-up Visit:  Assessment: Prostate carcinoma [facility]) 62 y.o. male with prostate cancer metastatic to the bone, previously treated with docetaxel chemotherapy and currently receiving androgen deprivation therapy with [name] administered every 3 months as well as denosumab for skeletal support.  No significant changes in the symptoms since the last visit.  Patient is suffering from significant constipation likely due to his chronic opioid use.  Plan: -Proceed with [name] injection today -Hold denosumab today due to calcium level of 8.7 -Lactulose 10 g every 8 hours as needed for constipation. -Return to clinic in 3 months with labs, clinic visit, [name] and denosumab.  Voice recognition software was used and creation of this note. Despite my best effort at editing the text, some misspelling/errors may have occurred.  Orders Placed This Encounter  Procedures  . CBC with Differential    Standing Status:   Future    Standing Expiration Date:   08/19/2018  . Comprehensive metabolic panel    Standing Status:   Future    Standing Expiration Date:   08/19/2018  . PSA    Standing Status:   Future    Standing Expiration Date:   08/19/2018    Cancer Staging Prostate carcinoma [facility]) Staging form: Prostate, AJCC 7th Edition - Clinical stage from 12/24/2015: Stage IV (TX, N0, M1b, PSA: 20 or greater) - Signed by Baird Cancer, PA-C on 01/07/2016   All questions were answered.  . The patient knows to call the clinic with any problems, questions or concerns.  This note was electronically signed.    History of Presenting Illness Matthew [name] 62 y.o. presenting to the [facility] for diagnosis of prostate cancer with skeletal metastasis previously treated with docetaxel chemotherapy and currently receiving maintenance treatment with [name] administered every 3 months as well as skeletal supportive therapy with denosumab.  Patient denies any new symptoms since  the last visit to the clinic.  Skeletal pain is about the same level.  No changes to his performance status.  Main complaint today is constipation that is attributable to his opioid requirements..  Oncological/hematological History:   Prostate carcinoma ([place])   12/10/2015 Imaging    CTA chest, multiple thoracic and rib osseous lesions. no primary evident. Atherosclerosis including aortic and CAD      12/12/2015 Imaging    CT abdomen/pelvis with sclerotic osseous lesions througout the lumbar spine, sacrum, bilateral iliac bones and L proximal femur worrisome for sclerotic osseous mets,mild prostatomegaly, no LAD, Infrarenal 3.8 cm AAA      12/12/2015 Tumor Marker    PSA 153.85      12/18/2015 Imaging    Bone Scan Diffuse bony metastatic disease, posterior calvarium, sternum, thoracic, lumbar spine, bilateral ribs, L shoulder, bilateral bony pelvis, proximal L femur      12/24/2015 Initial Biopsy    CT biopsy of L iliac bone lesion      12/26/2015 Pathology Results    Bone, biopsy, left iliac - POSITIVE FOR METASTATIC ADENOCARCINOMA.      12/28/2015 - 09/01/2016 Chemotherapy    Firmagon instituted 240 mg       01/01/2016 Procedure    Port placed by IR.      01/07/2016 - 04/11/2016 Chemotherapy    Docetaxel every 21 days x 6 cycles with dose reductions due to tolerance      02/18/2016 Adverse Reaction    GI toxicity from docetaxel, dose reduced to 60 mg/m2      02/18/2016 Treatment Plan Change  Docetaxel dose reduced by 20%      03/06/2016 Procedure    Dr. Enrique Sack- Multiple extraction of tooth numbers 3, 5, 6, 8, 9, 10, 11, 12, 20, 21, 22, 27, 28, and 31. 4 Quadrants of alveoloplasty. Bilateral mandibular lingual tori reductions.      04/09/2016 Imaging    Brain MRI 1. No intracranial metastatic disease 2. No acute intracranial abnormality 3. Findings of chronic microvascular disease      05/14/2016 Imaging    CT C/A/P Stable appearing diffuse bony metastatic disease. No new  significant disease is seen Innumerable patchy sclerotic osseous metastases throughout the axial and proximal appendicular skeleton, stable in size and distribution, significantly increased in density in the interval, likely reflecting treatment effect. 2. No new or progressive metastatic disease in the chest, abdomen or pelvis. 3. Bilateral perifissural pulmonary nodules are stable and probably benign. 4. Aortic atherosclerosis. Infrarenal 4.3 cm abdominal aortic aneurysm, minimally increased in size. Recommend followup by ultrasound in 1 year.      06/02/2016 Tumor Marker    PSA 0.99 ng/ml       - 07/31/2016 Radiation Therapy    Palliative XRT by Dr. Lianne Cure in [place].       Chemotherapy    Depo-[name] every 3 months, beginning in Feb 2018        Medical History: Past Medical History:  Diagnosis Date  . Arthritis    Left knee  . Bone metastases ([place]) 12/26/2015  . Cancer [facility])    Prostate  . DVT (deep venous thrombosis) ([place]) 2004  . Kidney stones    ~2002  . Prostate carcinoma ([place]) 12/26/2015    Surgical History: Past Surgical History:  Procedure Laterality Date  . CIRCUMCISION  30 years ago  . MULTIPLE EXTRACTIONS WITH ALVEOLOPLASTY N/A 03/06/2016   Procedure: Extraction of tooth #'s 3,5,6,8-12,20-22, 27, 28, and 31 with alveoloplasty and bilateral mandibular tori reductions;  Surgeon: Lenn Cal, DDS;  Location: [place];  Service: Oral Surgery;  Laterality: N/A;    Family History: Family History  Problem Relation Age of Onset  . Cancer Mother     Social History: Social History   Socioeconomic History  . Marital status: Single    Spouse name: Not on file  . Number of children: 2  . Years of education: Not on file  . Highest education level: Not on file  Social Needs  . Financial resource strain: Not on file  . Food insecurity - worry: Not on file  . Food insecurity - inability: Not on file  . Transportation needs - medical: Not on file  .  Transportation needs - non-medical: Not on file  Occupational History  . Occupation: Construction/carpenter  Tobacco Use  . Smoking status: Current Every Day Smoker    Packs/day: 1.00    Years: 50.00    Pack years: 50.00    Types: Cigarettes    Start date: 06/16/1965  . Smokeless tobacco: Former Systems developer    Types: [place] date: 08/04/2005  Substance and Sexual Activity  . Alcohol use: No    Alcohol/week: 0.0 oz  . Drug use: Yes    Types: Marijuana    Comment: THC for appetite  . Sexual activity: Not on file  Other Topics Concern  . Not on file  Social History Narrative  . Not on file    Allergies: Allergies  Allergen Reactions  . Meloxicam Other (See Comments)    Caused stomach bleeding.    Medications:  Current Outpatient Medications  Medication Sig Dispense Refill  . Calcium Carb-Cholecalciferol (CALCIUM 500 + D3) 500-600 MG-UNIT TABS Take 2 tablets by mouth daily. 60 tablet 6  . ergocalciferol (VITAMIN D2) 50000 units capsule Take 1 capsule (50,000 Units total) by mouth every Friday. 30 capsule 4  . fentaNYL (DURAGESIC - DOSED MCG/HR) 75 MCG/HR Place 1 patch (75 mcg total) onto the skin every 3 (three) days. 10 patch 0  . HYDROcodone-acetaminophen (NORCO) 10-325 MG tablet Take 1 tablet by mouth every 4 (four) hours as needed. 80 tablet 0  . lidocaine-prilocaine (EMLA) cream Apply a quarter size amount to port site 1 hour prior to chemo. Do not rub in. Cover with plastic wrap. 30 g 3  . omeprazole (PRILOSEC) 40 MG capsule TAKE ONE CAPSULE BY MOUTH TWICE DAILY 60 capsule 3  . prochlorperazine (COMPAZINE) 10 MG tablet Take 1 tablet (10 mg total) by mouth every 6 (six) hours as needed for nausea or vomiting. 30 tablet 0  . rosuvastatin (CRESTOR) 20 MG tablet TAKE ONE TABLET BY MOUTH ONCE DAILY 30 tablet 11  . lactulose (CEPHULAC) 10 g packet Take 1 packet (10 g total) by mouth 3 (three) times daily as needed (if no bowel movement in 48hrs). 30 each 0  . temazepam (RESTORIL)  15 MG capsule Take 1 capsule (15 mg total) by mouth at bedtime as needed for sleep. (Patient not taking: Reported on 08/19/2017) 30 capsule 0   No current facility-administered medications for this visit.     Review of Systems: Review of Systems  Gastrointestinal: Positive for constipation and nausea.  All other systems reviewed and are negative.    PHYSICAL EXAMINATION Blood pressure 116/73, pulse 83, temperature 97.9 F (36.6 C), temperature source Oral, resp. rate 20, height _0  (1.854 m), weight 246 lb 9.6 oz (111.9 kg), SpO2 99 %.  ECOG PERFORMANCE STATUS: 1 - Symptomatic but completely ambulatory  Physical Exam  Constitutional: He is well-developed, well-nourished, and in no distress. No distress.  HENT:  Head: Normocephalic and atraumatic.  Mouth/Throat: Oropharynx is clear and moist. No oropharyngeal exudate.  Eyes: Conjunctivae and EOM are normal. Pupils are equal, round, and reactive to light. No scleral icterus.  Neck: No thyromegaly present.  Cardiovascular: Normal rate, regular rhythm and normal heart sounds.  No murmur heard. Pulmonary/Chest: Effort normal and breath sounds normal. No respiratory distress. He has no wheezes. He has no rales.  Abdominal: Soft. Bowel sounds are normal. He exhibits no distension and no mass. There is no tenderness. There is no guarding.  Lymphadenopathy:    He has no cervical adenopathy.  Skin: He is not diaphoretic.     LABORATORY DATA: I have personally reviewed the data as listed: Appointment on 08/19/2017  Component Date Value Ref Range Status  . WBC 08/19/2017 4.5  4.0 - 10.5 K/uL Final  . RBC 08/19/2017 4.37  4.22 - 5.81 MIL/uL Final  . Hemoglobin 08/19/2017 12.8* 13.0 - 17.0 g/dL Final  . HCT 08/19/2017 40.1  39.0 - 52.0 % Final  . MCV 08/19/2017 91.8  78.0 - 100.0 fL Final  . MCH 08/19/2017 29.3  26.0 - 34.0 pg Final  . MCHC 08/19/2017 31.9  30.0 - 36.0 g/dL Final  . RDW 08/19/2017 13.5  11.5 - 15.5 % Final  .  Platelets 08/19/2017 186  150 - 400 K/uL Final  . Neutrophils Relative % 08/19/2017 71  % Final  . Neutro Abs 08/19/2017 3.1  1.7 - 7.7 K/uL Final  . Lymphocytes  Relative 08/19/2017 19  % Final  . Lymphs Abs 08/19/2017 0.9  0.7 - 4.0 K/uL Final  . Monocytes Relative 08/19/2017 7  % Final  . Monocytes Absolute 08/19/2017 0.3  0.1 - 1.0 K/uL Final  . Eosinophils Relative 08/19/2017 3  % Final  . Eosinophils Absolute 08/19/2017 0.2  0.0 - 0.7 K/uL Final  . Basophils Relative 08/19/2017 0  % Final  . Basophils Absolute 08/19/2017 0.0  0.0 - 0.1 K/uL Final  . Sodium 08/19/2017 141  135 - 145 mmol/L Final  . Potassium 08/19/2017 3.7  3.5 - 5.1 mmol/L Final  . Chloride 08/19/2017 108  101 - 111 mmol/L Final  . CO2 08/19/2017 22  22 - 32 mmol/L Final  . Glucose, Bld 08/19/2017 99  65 - 99 mg/dL Final  . BUN 08/19/2017 12  6 - 20 mg/dL Final  . Creatinine, Ser 08/19/2017 1.06  0.61 - 1.24 mg/dL Final  . Calcium 08/19/2017 8.3* 8.9 - 10.3 mg/dL Final  . Total Protein 08/19/2017 6.6  6.5 - 8.1 g/dL Final  . Albumin 08/19/2017 3.5  3.5 - 5.0 g/dL Final  . AST 08/19/2017 22  15 - 41 U/L Final  . ALT 08/19/2017 20  17 - 63 U/L Final  . Alkaline Phosphatase 08/19/2017 56  38 - 126 U/L Final  . Total Bilirubin 08/19/2017 0.3  0.3 - 1.2 mg/dL Final  . GFR calc non Af Amer 08/19/2017 >60  >60 mL/min Final  . GFR calc Af Amer 08/19/2017 >60  >60 mL/min Final   Comment: (NOTE) The eGFR has been calculated using the CKD EPI equation. This calculation has not been validated in all clinical situations. eGFR's persistently <60 mL/min signify possible Chronic Kidney Disease.   . Anion gap 08/19/2017 11  5 - 15 Final  . Prostatic Specific Antigen 08/19/2017 2.83  0.00 - 4.00 ng/mL Final   Comment: (NOTE) While PSA levels of <=4.0 ng/ml are reported as reference range, some men with levels below 4.0 ng/ml can have prostate cancer and many men with PSA above 4.0 ng/ml do not have prostate cancer.  Other  tests such as free PSA, age specific reference ranges, PSA velocity and PSA doubling time may be helpful especially in men less than 25 years old. Performed at [facility], [place] [address] [place], [place] [address]   . Testosterone 08/19/2017 20* 264 - 916 ng/dL Final   Comment: (NOTE) Adult male reference interval is based on a population of healthy nonobese males (BMI <30) between 57 and 27 years old. [place], [place] [phone]. PMID: [id number]. Performed At: [facility] [place], [place] [id number] Rush Farmer MD BN:[id number]        Ardath Sax, MD

## 2017-09-01 NOTE — Assessment & Plan Note (Signed)
62 y.o. male with prostate cancer metastatic to the bone, previously treated with docetaxel chemotherapy and currently receiving androgen deprivation therapy with [name] administered every 3 months as well as denosumab for skeletal support.  No significant changes in the symptoms since the last visit.  Patient is suffering from significant constipation likely due to his chronic opioid use.  Plan: -Proceed with [name] injection today -Hold denosumab today due to calcium level of 8.7 -Lactulose 10 g every 8 hours as needed for constipation. -Return to clinic in 3 months with labs, clinic visit, [name] and denosumab.

## 2017-09-16 ENCOUNTER — Other Ambulatory Visit (HOSPITAL_COMMUNITY): Payer: Self-pay

## 2017-09-16 ENCOUNTER — Ambulatory Visit (HOSPITAL_COMMUNITY): Payer: Self-pay

## 2017-09-23 ENCOUNTER — Emergency Department (HOSPITAL_COMMUNITY): Payer: BLUE CROSS/BLUE SHIELD

## 2017-09-23 ENCOUNTER — Other Ambulatory Visit (HOSPITAL_COMMUNITY): Payer: Self-pay | Admitting: *Deleted

## 2017-09-23 ENCOUNTER — Other Ambulatory Visit: Payer: Self-pay

## 2017-09-23 ENCOUNTER — Emergency Department (HOSPITAL_COMMUNITY)
Admission: EM | Admit: 2017-09-23 | Discharge: 2017-09-23 | Disposition: A | Discharge status: Home / Self Care | Payer: BLUE CROSS/BLUE SHIELD | Attending: Emergency Medicine | Admitting: Emergency Medicine

## 2017-09-23 ENCOUNTER — Encounter (HOSPITAL_COMMUNITY): Payer: Self-pay | Admitting: Emergency Medicine

## 2017-09-23 DIAGNOSIS — Z7982 Long term (current) use of aspirin: Secondary | ICD-10-CM | POA: Diagnosis not present

## 2017-09-23 DIAGNOSIS — R55 Syncope and collapse: Secondary | ICD-10-CM

## 2017-09-23 DIAGNOSIS — G893 Neoplasm related pain (acute) (chronic): Secondary | ICD-10-CM | POA: Diagnosis not present

## 2017-09-23 DIAGNOSIS — R079 Chest pain, unspecified: Secondary | ICD-10-CM | POA: Diagnosis not present

## 2017-09-23 DIAGNOSIS — Z8546 Personal history of malignant neoplasm of prostate: Secondary | ICD-10-CM | POA: Insufficient documentation

## 2017-09-23 DIAGNOSIS — Z79899 Other long term (current) drug therapy: Secondary | ICD-10-CM | POA: Insufficient documentation

## 2017-09-23 DIAGNOSIS — Z85118 Personal history of other malignant neoplasm of bronchus and lung: Secondary | ICD-10-CM | POA: Insufficient documentation

## 2017-09-23 DIAGNOSIS — C7951 Secondary malignant neoplasm of bone: Secondary | ICD-10-CM | POA: Diagnosis not present

## 2017-09-23 DIAGNOSIS — F17210 Nicotine dependence, cigarettes, uncomplicated: Secondary | ICD-10-CM | POA: Insufficient documentation

## 2017-09-23 DIAGNOSIS — G8929 Other chronic pain: Secondary | ICD-10-CM

## 2017-09-23 DIAGNOSIS — C61 Malignant neoplasm of prostate: Secondary | ICD-10-CM

## 2017-09-23 HISTORY — DX: Malignant neoplasm of bone and articular cartilage, unspecified: C41.9

## 2017-09-23 LAB — CBC WITH DIFFERENTIAL/PLATELET
Basophils Absolute: 0 10*3/uL (ref 0.0–0.1)
Basophils Relative: 0 %
EOS ABS: 0.1 10*3/uL (ref 0.0–0.7)
EOS PCT: 3 %
HCT: 39.4 % (ref 39.0–52.0)
Hemoglobin: 12.8 g/dL — ABNORMAL LOW (ref 13.0–17.0)
LYMPHS ABS: 0.8 10*3/uL (ref 0.7–4.0)
Lymphocytes Relative: 15 %
MCH: 29.6 pg (ref 26.0–34.0)
MCHC: 32.5 g/dL (ref 30.0–36.0)
MCV: 91.2 fL (ref 78.0–100.0)
MONO ABS: 0.3 10*3/uL (ref 0.1–1.0)
MONOS PCT: 7 %
NEUTROS PCT: 75 %
Neutro Abs: 3.9 10*3/uL (ref 1.7–7.7)
PLATELETS: 219 10*3/uL (ref 150–400)
RBC: 4.32 MIL/uL (ref 4.22–5.81)
RDW: 13.4 % (ref 11.5–15.5)
WBC: 5.2 10*3/uL (ref 4.0–10.5)

## 2017-09-23 LAB — BASIC METABOLIC PANEL
Anion gap: 14 (ref 5–15)
BUN: 11 mg/dL (ref 6–20)
CALCIUM: 8.5 mg/dL — AB (ref 8.9–10.3)
CO2: 21 mmol/L — ABNORMAL LOW (ref 22–32)
CREATININE: 1.09 mg/dL (ref 0.61–1.24)
Chloride: 102 mmol/L (ref 101–111)
GFR calc Af Amer: >60 mL/min (ref >60)
Glucose, Bld: 123 mg/dL — ABNORMAL HIGH (ref 65–99)
POTASSIUM: 3.4 mmol/L — AB (ref 3.5–5.1)
SODIUM: 137 mmol/L (ref 135–145)

## 2017-09-23 LAB — I-STAT TROPONIN, ED
TROPONIN I, POC: 0 ng/mL (ref 0.00–0.08)
TROPONIN I, POC: 0 ng/mL (ref 0.00–0.08)

## 2017-09-23 LAB — INFLUENZA PANEL BY PCR (TYPE A & B)
Influenza A By PCR: NEGATIVE
Influenza B By PCR: NEGATIVE

## 2017-09-23 LAB — CBG MONITORING, ED: Glucose-Capillary: 120 mg/dL — ABNORMAL HIGH (ref 65–99)

## 2017-09-23 MED ORDER — HYDROCODONE-ACETAMINOPHEN 5-325 MG PO TABS
2.0000 | ORAL_TABLET | Freq: Once | ORAL | Status: AC
  Administered 2017-09-23: 2 via ORAL
  Filled 2017-09-23: qty 2

## 2017-09-23 MED ORDER — SODIUM CHLORIDE 0.9 % IV BOLUS (SEPSIS)
500.0000 mL | Freq: Once | INTRAVENOUS | Status: AC
  Administered 2017-09-23: 500 mL via INTRAVENOUS

## 2017-09-23 MED ORDER — IOPAMIDOL (ISOVUE-370) INJECTION 76%
100.0000 mL | Freq: Once | INTRAVENOUS | Status: AC | PRN
  Administered 2017-09-23: 100 mL via INTRAVENOUS

## 2017-09-23 MED ORDER — NITROGLYCERIN 0.4 MG SL SUBL
0.4000 mg | SUBLINGUAL_TABLET | Freq: Once | SUBLINGUAL | Status: AC
Start: 2017-09-23 — End: 2017-09-23
  Administered 2017-09-23: 0.4 mg via SUBLINGUAL
  Filled 2017-09-23: qty 1

## 2017-09-23 MED ORDER — HEPARIN SOD (PORK) LOCK FLUSH 100 UNIT/ML IV SOLN
INTRAVENOUS | Status: AC
  Administered 2017-09-23: 500 [IU]
  Filled 2017-09-23: qty 5

## 2017-09-23 MED ORDER — ASPIRIN 81 MG PO CHEW
324.0000 mg | CHEWABLE_TABLET | Freq: Once | ORAL | Status: AC
  Administered 2017-09-23: 243 mg via ORAL
  Filled 2017-09-23: qty 4

## 2017-09-23 NOTE — ED Notes (Signed)
Pt sitting up on side of bed  States he feels good sitting up.

## 2017-09-23 NOTE — ED Triage Notes (Signed)
Pt states has had head congestion for 10 days. Pt states woke up to use bathroom and woke up in floor. States woke up sob and with left sided chest pressure. gen weakness noted. Non diaphoretic. Abrasions noted to right upper back and right upper post arm from fall.

## 2017-09-23 NOTE — Discharge Instructions (Signed)
I suspect your syncope may be related to a transient low blood pressure when you first stand up from a seated or lying position.  After sleeping or sitting for any length of time, get up slowly and let your body adjust to the new position before walking. Make sure you are drinking plenty of fluids.  See your oncologist next week as planned.

## 2017-09-23 NOTE — ED Provider Notes (Signed)
[facility] EMERGENCY DEPARTMENT Provider Note   CSN: [id number] Arrival date & time: 09/23/17  1001     History   Chief Complaint Chief Complaint  Patient presents with  . Chest Pain    syncope    HPI Matthew [name] is a 62 y.o. male with a history of prostate cancer with bone and lung metastasis and distant history of dvt, reportedly from varicose vein complications, presenting with syncope occurring this am as he was urinating.  Since he passed out he has had persistent left chest pressure.  He endorses having upper uri sx including nasal congestion, clear drainage, persistent nonproductive cough (but endorses has baseline chronic smokers cough) and has been increasingly short of breath.  He denies diaphorsis, n/v, headache and focal weakness. Also denies pain or injury from his fall. He does report chronic pain in his chest due to his metastatic disease and is treated with fentanyl patches and hydrocodone.  He states he frequently gets lightheaded when he bends over than stands up again.   The history is provided by the patient.    Past Medical History:  Diagnosis Date  . Arthritis    Left knee  . Bone cancer ([place])   . Bone metastases ([place]) 12/26/2015  . Cancer [facility])    Prostate  . DVT (deep venous thrombosis) ([place]) 2004  . Kidney stones    ~2002  . Prostate carcinoma ([place]) 12/26/2015    Patient Active Problem List   Diagnosis Date Noted  . Cancer related pain 06/02/2016  . Burn 01/14/2016  . Prostate carcinoma ([place]) 12/26/2015  . Bone metastases ([place]) 12/26/2015    Past Surgical History:  Procedure Laterality Date  . CIRCUMCISION  30 years ago  . MULTIPLE EXTRACTIONS WITH ALVEOLOPLASTY N/A 03/06/2016   Procedure: Extraction of tooth #'s 3,5,6,8-12,20-22, 27, 28, and 31 with alveoloplasty and bilateral mandibular tori reductions;  Surgeon: Lenn Cal, DDS;  Location: [place];  Service: Oral Surgery;  Laterality: N/A;       Home Medications    Prior to  Admission medications   Medication Sig Start Date End Date Taking? Authorizing Provider  aspirin EC 81 MG tablet Take 81 mg by mouth daily.   Yes [provider]  Calcium Carb-Cholecalciferol (CALCIUM 500 + D3) 500-600 MG-UNIT TABS Take 2 tablets by mouth daily. 12/27/15  Yes Penland, Kelby Fam, MD  ergocalciferol (VITAMIN D2) 50000 units capsule Take 1 capsule (50,000 Units total) by mouth every Friday. 03/06/17  Yes Twana First, MD  fentaNYL (DURAGESIC - DOSED MCG/HR) 75 MCG/HR Place 1 patch (75 mcg total) onto the skin every 3 (three) days. 08/26/17 09/25/17 Yes Ardath Sax, MD  HYDROcodone-acetaminophen (NORCO) 10-325 MG tablet Take 1 tablet by mouth every 4 (four) hours as needed. 07/22/17  Yes Twana First, MD  lactulose (CEPHULAC) 10 g packet Take 1 packet (10 g total) by mouth 3 (three) times daily as needed (if no bowel movement in 48hrs). 08/19/17  Yes Ardath Sax, MD  lidocaine-prilocaine (EMLA) cream Apply a quarter size amount to port site 1 hour prior to chemo. Do not rub in. Cover with plastic wrap. 12/27/15  Yes Penland, Kelby Fam, MD  omeprazole (PRILOSEC) 40 MG capsule TAKE ONE CAPSULE BY MOUTH TWICE DAILY 03/25/17  Yes Holley Bouche, NP  prochlorperazine (COMPAZINE) 10 MG tablet Take 1 tablet (10 mg total) by mouth every 6 (six) hours as needed for nausea or vomiting. 04/29/17  Yes Twana First, MD  rosuvastatin (CRESTOR)  20 MG tablet TAKE ONE TABLET BY MOUTH ONCE DAILY 08/06/17  Yes Fay Records, MD  temazepam (RESTORIL) 15 MG capsule Take 1 capsule (15 mg total) by mouth at bedtime as needed for sleep. Patient not taking: Reported on 08/19/2017 09/29/16   Twana First, MD    Family History Family History  Problem Relation Age of Onset  . Cancer Mother     Social History Social History   Tobacco Use  . Smoking status: Current Every Day Smoker    Packs/day: 1.00    Years: 50.00    Pack years: 50.00    Types: Cigarettes    Start date: 06/16/1965  .  Smokeless tobacco: Former Systems developer    Types: Chew    Quit date: 08/04/2005  Substance Use Topics  . Alcohol use: No    Alcohol/week: 0.0 oz  . Drug use: Yes    Types: Marijuana    Comment: THC for appetite     Allergies   Meloxicam   Review of Systems Review of Systems  Constitutional: Negative for fever.  HENT: Negative for congestion and sore throat.   Eyes: Negative.   Respiratory: Negative for chest tightness and shortness of breath.   Cardiovascular: Positive for chest pain. Negative for palpitations and leg swelling.  Gastrointestinal: Negative for abdominal pain and nausea.  Genitourinary: Negative.   Musculoskeletal: Negative for arthralgias, joint swelling and neck pain.  Skin: Negative.  Negative for rash and wound.  Neurological: Positive for syncope. Negative for dizziness, weakness, light-headedness, numbness and headaches.  Psychiatric/Behavioral: Negative.      Physical Exam Updated Vital Signs BP 98/74 (BP Location: Left Arm)   Pulse 79   Temp 98.6 F (37 C) (Oral)   Resp 18   SpO2 98%   Physical Exam  Constitutional: He appears well-developed and well-nourished.  HENT:  Head: Normocephalic and atraumatic.  Eyes: Conjunctivae are normal.  Neck: Normal range of motion.  Cardiovascular: Normal rate, regular rhythm, normal heart sounds and intact distal pulses.  Pulmonary/Chest: Effort normal and breath sounds normal. He has no decreased breath sounds. He has no wheezes. He has no rhonchi.  Abdominal: Soft. Bowel sounds are normal. There is no tenderness.  Musculoskeletal: Normal range of motion.  Neurological: He is alert.  Skin: Skin is warm and dry.  Small abrasion right upper back  Psychiatric: He has a normal mood and affect.  Nursing note and vitals reviewed.    ED Treatments / Results  Labs (all labs ordered are listed, but only abnormal results are displayed) Labs Reviewed  BASIC METABOLIC PANEL - Abnormal; Notable for the following  components:      Result Value   Potassium 3.4 (*)    CO2 21 (*)    Glucose, Bld 123 (*)    Calcium 8.5 (*)    All other components within normal limits  CBC WITH DIFFERENTIAL/PLATELET - Abnormal; Notable for the following components:   Hemoglobin 12.8 (*)    All other components within normal limits  CBG MONITORING, ED - Abnormal; Notable for the following components:   Glucose-Capillary 120 (*)    All other components within normal limits  INFLUENZA PANEL BY PCR (TYPE A & B)  I-STAT TROPONIN, ED  I-STAT TROPONIN, ED  I-STAT TROPONIN, ED    EKG  EKG Interpretation  Date/Time:  Wednesday September 23 2017 10:12:24 EST Ventricular Rate:  96 PR Interval:    QRS Duration: 85 QT Interval:  360 QTC Calculation: 455 R Axis:  81 Text Interpretation:  Sinus rhythm Low voltage, extremity and precordial leads No significant change since last tracing Confirmed by Isla Pence [id number]) on 09/23/2017 10:16:13 AM       Radiology Ct Angio Chest Pe W And/or Wo Contrast  Result Date: 09/23/2017 CLINICAL DATA:  Shortness of breath and left-sided chest pain. EXAM: CT ANGIOGRAPHY CHEST WITH CONTRAST TECHNIQUE: Multidetector CT imaging of the chest was performed using the standard protocol during bolus administration of intravenous contrast. Multiplanar CT image reconstructions and MIPs were obtained to evaluate the vascular anatomy. CONTRAST:  140mL ISOVUE-370 IOPAMIDOL (ISOVUE-370) INJECTION 76% COMPARISON:  CT chest, abdomen, and pelvis dated May 14, 2016. FINDINGS: Cardiovascular: Satisfactory opacification of the pulmonary arteries to the segmental level. No evidence of pulmonary embolism. Normal heart size. No pericardial effusion. Normal caliber thoracic aorta. No aortic dissection. Unchanged right chest wall port catheter with the tip in the proximal right atrium. Mediastinum/Nodes: No enlarged mediastinal, hilar, or axillary lymph nodes. Thyroid gland, trachea, and esophagus demonstrate  no significant findings. Lungs/Pleura: Diffuse central peribronchial thickening. Dependent atelectasis in both lungs. No focal consolidation, pleural effusion, or pneumothorax. 5-6 mm subpleural nodules in the bilateral anterior lower lobes are unchanged. No new pulmonary nodule. Mild emphysema. Upper Abdomen: No acute abnormality. Musculoskeletal: Diffuse sclerotic osseous metastases throughout the visualized bony thorax, not significantly changed. No acute fracture. Review of the MIP images confirms the above findings. IMPRESSION: 1. No evidence of pulmonary embolism. No acute intrathoracic process. 2. Diffuse sclerotic osseous metastases throughout the bony thorax, not significantly changed. 3. Diffuse central peribronchial thickening, likely smoking-related. 4.  Emphysema (ICD10-J43.9). Electronically Signed   By: Titus Dubin M.D.   On: 09/23/2017 13:33   Dg Chest Portable 1 View  Result Date: 09/23/2017 CLINICAL DATA:  12/10/2015 EXAM: PORTABLE CHEST 1 VIEW COMPARISON:  12/10/2015 FINDINGS: Heart is upper limits normal in size. No confluent airspace opacities or effusions. Sclerotic bone metastases within the left 6th rib. Right Port-A-Cath in place with the tip in the SVC. IMPRESSION: No acute cardiopulmonary disease. Sclerotic bony metastatic disease. Electronically Signed   By: Rolm Baptise M.D.   On: 09/23/2017 10:53    Procedures Procedures (including critical care time)  Medications Ordered in ED Medications  aspirin chewable tablet 324 mg (243 mg Oral Given 09/23/17 1045)  nitroGLYCERIN (NITROSTAT) SL tablet 0.4 mg (0.4 mg Sublingual Given 09/23/17 1048)  iopamidol (ISOVUE-370) 76 % injection 100 mL (100 mLs Intravenous Contrast Given 09/23/17 1305)  sodium chloride 0.9 % bolus 500 mL (0 mLs Intravenous Stopped 09/23/17 1603)  HYDROcodone-acetaminophen (NORCO/VICODIN) 5-325 MG per tablet 2 tablet (2 tablets Oral Given 09/23/17 1627)  heparin lock flush 100 UNIT/ML injection (500 Units   Given 09/23/17 1627)     Initial Impression / Assessment and Plan / ED Course  I have reviewed the triage vital signs and the nursing notes.  Pertinent labs & imaging results that were available during my care of the patient were reviewed by me and considered in my medical decision making (see chart for details).     Pt given asa and 1 ntg tablet while here.  No response to ntg tablet.  He continued to have cp but endorsed it was his chronic pain from his metastatic disease.  Labs, imaging reviewed and discussed.  No PE, delta trops negative.  Suspect vasovagal syncope. No arrhythmia's while on monitor. Pt has f/u with oncology in one week.  Advised to increase fluid intake and to use caution when standing quickly.  He  was was asymptomatic when orthostatics obtained prior to dc home.  Final Clinical Impressions(s) / ED Diagnoses   Final diagnoses:  Syncope, unspecified syncope type  Chronic chest pain    ED Discharge Orders    None       Landis Martins 09/23/17 1649    Isla Pence, MD 09/25/17 [phone]

## 2017-09-23 NOTE — ED Notes (Signed)
Pt states he feels ok when he stands up.

## 2017-09-25 ENCOUNTER — Inpatient Hospital Stay (HOSPITAL_COMMUNITY): Payer: BLUE CROSS/BLUE SHIELD

## 2017-09-25 ENCOUNTER — Inpatient Hospital Stay (HOSPITAL_COMMUNITY): Payer: BLUE CROSS/BLUE SHIELD | Attending: Hematology and Oncology

## 2017-09-25 ENCOUNTER — Other Ambulatory Visit: Payer: Self-pay | Admitting: Oncology

## 2017-09-25 ENCOUNTER — Other Ambulatory Visit (HOSPITAL_COMMUNITY): Payer: Self-pay | Admitting: Adult Health

## 2017-09-25 DIAGNOSIS — C61 Malignant neoplasm of prostate: Secondary | ICD-10-CM | POA: Diagnosis not present

## 2017-09-25 DIAGNOSIS — C7951 Secondary malignant neoplasm of bone: Secondary | ICD-10-CM

## 2017-09-25 LAB — COMPREHENSIVE METABOLIC PANEL
ALT: 16 U/L — ABNORMAL LOW (ref 17–63)
ANION GAP: 11 (ref 5–15)
AST: 15 U/L (ref 15–41)
Albumin: 3.6 g/dL (ref 3.5–5.0)
Alkaline Phosphatase: 60 U/L (ref 38–126)
BUN: 14 mg/dL (ref 6–20)
CHLORIDE: 105 mmol/L (ref 101–111)
CO2: 23 mmol/L (ref 22–32)
CREATININE: 1.11 mg/dL (ref 0.61–1.24)
Calcium: 9 mg/dL (ref 8.9–10.3)
Glucose, Bld: 103 mg/dL — ABNORMAL HIGH (ref 65–99)
POTASSIUM: 4 mmol/L (ref 3.5–5.1)
SODIUM: 139 mmol/L (ref 135–145)
Total Bilirubin: 0.4 mg/dL (ref 0.3–1.2)
Total Protein: 6.7 g/dL (ref 6.5–8.1)

## 2017-09-25 LAB — CBC WITH DIFFERENTIAL/PLATELET
Basophils Absolute: 0 10*3/uL (ref 0.0–0.1)
Basophils Relative: 1 %
EOS ABS: 0.1 10*3/uL (ref 0.0–0.7)
Eosinophils Relative: 3 %
HCT: 40.2 % (ref 39.0–52.0)
Hemoglobin: 12.9 g/dL — ABNORMAL LOW (ref 13.0–17.0)
LYMPHS ABS: 0.7 10*3/uL (ref 0.7–4.0)
LYMPHS PCT: 17 %
MCH: 29.3 pg (ref 26.0–34.0)
MCHC: 32.1 g/dL (ref 30.0–36.0)
MCV: 91.4 fL (ref 78.0–100.0)
MONO ABS: 0.4 10*3/uL (ref 0.1–1.0)
Monocytes Relative: 10 %
Neutro Abs: 3 10*3/uL (ref 1.7–7.7)
Neutrophils Relative %: 69 %
PLATELETS: 212 10*3/uL (ref 150–400)
RBC: 4.4 MIL/uL (ref 4.22–5.81)
RDW: 13.4 % (ref 11.5–15.5)
WBC: 4.3 10*3/uL (ref 4.0–10.5)

## 2017-09-25 LAB — PSA: PROSTATIC SPECIFIC ANTIGEN: 7.06 ng/mL — AB (ref 0.00–4.00)

## 2017-09-25 MED ORDER — HYDROCODONE-ACETAMINOPHEN 10-325 MG PO TABS: 1.0000 | ORAL_TABLET | ORAL | 0 refills | Status: DC | PRN

## 2017-10-02 ENCOUNTER — Encounter (HOSPITAL_COMMUNITY): Payer: Self-pay

## 2017-10-02 ENCOUNTER — Inpatient Hospital Stay (HOSPITAL_COMMUNITY): Payer: BLUE CROSS/BLUE SHIELD | Attending: Hematology and Oncology

## 2017-10-02 ENCOUNTER — Other Ambulatory Visit (HOSPITAL_COMMUNITY): Payer: Self-pay

## 2017-10-02 VITALS — BP 121/81 | HR 83 | Temp 98.1°F | Resp 18

## 2017-10-02 DIAGNOSIS — Z5111 Encounter for antineoplastic chemotherapy: Secondary | ICD-10-CM | POA: Diagnosis present

## 2017-10-02 DIAGNOSIS — C7951 Secondary malignant neoplasm of bone: Secondary | ICD-10-CM | POA: Insufficient documentation

## 2017-10-02 DIAGNOSIS — C61 Malignant neoplasm of prostate: Secondary | ICD-10-CM | POA: Diagnosis present

## 2017-10-02 MED ORDER — FENTANYL 75 MCG/HR TD PT72: 75.0000 ug | MEDICATED_PATCH | TRANSDERMAL | 0 refills | Status: AC

## 2017-10-02 MED ORDER — LEUPROLIDE ACETATE (3 MONTH) 22.5 MG IM KIT
22.5000 mg | PACK | Freq: Once | INTRAMUSCULAR | Status: AC
  Administered 2017-10-02: 22.5 mg via INTRAMUSCULAR
  Filled 2017-10-02: qty 22.5

## 2017-10-02 MED ORDER — DENOSUMAB 120 MG/1.7ML ~~LOC~~ SOLN
120.0000 mg | Freq: Once | SUBCUTANEOUS | Status: AC
  Administered 2017-10-02: 120 mg via SUBCUTANEOUS
  Filled 2017-10-02: qty 1.7

## 2017-10-02 NOTE — Progress Notes (Signed)
Matthew [name] tolerated [name] and Xgeva injections well without complaints or incident. Calcium 9 from 09/25/17 and pt denies any tooth or jaw pain. Pt does continue to take his Calcium as prescribed without issues. VSS Pt discharged self ambulatory in satisfactory condition accompanied by family member

## 2017-10-02 NOTE — Patient Instructions (Signed)
[place] at [facility] Discharge Instructions  Received [name] and Xgeva injections today. Follow-up as scheduled. Call clinic for any questions or concerns   Thank you for choosing [place] at [facility] to provide your oncology and hematology care.  To afford each patient quality time with our provider, please arrive at least 15 minutes before your scheduled appointment time.   If you have a lab appointment with the [facility] please come in thru the  Main Entrance and check in at the main information desk  You need to re-schedule your appointment should you arrive 10 or more minutes late.  We strive to give you quality time with our providers, and arriving late affects you and other patients whose appointments are after yours.  Also, if you no show three or more times for appointments you may be dismissed from the clinic at the providers discretion.     Again, thank you for choosing [facility]  Our hope is that these requests will decrease the amount of time that you wait before being seen by our physicians.       _____________________________________________________________  Should you have questions after your visit to [facility], please contact our office at [phone] between the hours of 8:30 a.m. and 4:30 p.m.  Voicemails left after 4:30 p.m. will not be returned until the following business day.  For prescription refill requests, have your pharmacy contact our office.       Resources For Cancer Patients and their Caregivers ? American Cancer Society: Can assist with transportation, wigs, general needs, runs Look Good Feel Better.        534 577 5726 ? Cancer Care: Provides financial assistance, online support groups, medication/co-pay assistance.  1-800-813-HOPE 913 191 9408) ? [facility] Assists [place] cancer patients and their families through emotional ,  educational and financial support.  409-568-3843 ? [place] DSS Where to apply for food stamps, Medicaid and utility assistance. (831)642-8484 ? [employer]: Transportation to medical appointments. 360-418-6641 ? Social Security Administration: May apply for disability if have a Stage IV cancer. 867-388-4522 (301)471-8828 ? [employer], Disability and Transit Services: Assists with nutrition, care and transit needs. [place] Support Programs:   > Cancer Support Group  2nd Tuesday of the month 1pm-2pm, Journey Room   > Creative Journey  3rd Tuesday of the month 1130am-1pm, Journey Room

## 2017-10-23 ENCOUNTER — Ambulatory Visit (HOSPITAL_COMMUNITY): Payer: Self-pay

## 2017-10-23 ENCOUNTER — Other Ambulatory Visit (HOSPITAL_COMMUNITY): Payer: Self-pay

## 2017-10-28 ENCOUNTER — Other Ambulatory Visit (HOSPITAL_COMMUNITY): Payer: Self-pay | Admitting: *Deleted

## 2017-10-28 DIAGNOSIS — C61 Malignant neoplasm of prostate: Secondary | ICD-10-CM

## 2017-10-30 ENCOUNTER — Ambulatory Visit (HOSPITAL_COMMUNITY): Payer: Self-pay

## 2017-10-30 ENCOUNTER — Other Ambulatory Visit (HOSPITAL_COMMUNITY): Payer: Self-pay

## 2017-10-30 ENCOUNTER — Ambulatory Visit (HOSPITAL_COMMUNITY): Payer: Self-pay | Admitting: Internal Medicine

## 2017-11-03 ENCOUNTER — Encounter (HOSPITAL_COMMUNITY): Payer: Self-pay | Admitting: Internal Medicine

## 2017-11-03 ENCOUNTER — Inpatient Hospital Stay (HOSPITAL_BASED_OUTPATIENT_CLINIC_OR_DEPARTMENT_OTHER): Payer: BLUE CROSS/BLUE SHIELD | Admitting: Internal Medicine

## 2017-11-03 ENCOUNTER — Inpatient Hospital Stay (HOSPITAL_COMMUNITY): Payer: BLUE CROSS/BLUE SHIELD

## 2017-11-03 ENCOUNTER — Inpatient Hospital Stay (HOSPITAL_COMMUNITY): Payer: BLUE CROSS/BLUE SHIELD | Attending: Internal Medicine

## 2017-11-03 ENCOUNTER — Other Ambulatory Visit: Payer: Self-pay

## 2017-11-03 VITALS — BP 109/72 | HR 87 | Temp 98.2°F | Resp 18 | Ht 73.0 in | Wt 243.0 lb

## 2017-11-03 DIAGNOSIS — C61 Malignant neoplasm of prostate: Secondary | ICD-10-CM | POA: Diagnosis not present

## 2017-11-03 DIAGNOSIS — C7951 Secondary malignant neoplasm of bone: Secondary | ICD-10-CM

## 2017-11-03 DIAGNOSIS — G893 Neoplasm related pain (acute) (chronic): Secondary | ICD-10-CM | POA: Diagnosis not present

## 2017-11-03 DIAGNOSIS — K5900 Constipation, unspecified: Secondary | ICD-10-CM

## 2017-11-03 DIAGNOSIS — F17210 Nicotine dependence, cigarettes, uncomplicated: Secondary | ICD-10-CM | POA: Diagnosis not present

## 2017-11-03 DIAGNOSIS — R0602 Shortness of breath: Secondary | ICD-10-CM

## 2017-11-03 LAB — COMPREHENSIVE METABOLIC PANEL
ALBUMIN: 3.9 g/dL (ref 3.5–5.0)
ALT: 15 U/L — AB (ref 17–63)
AST: 16 U/L (ref 15–41)
Alkaline Phosphatase: 65 U/L (ref 38–126)
Anion gap: 14 (ref 5–15)
BUN: 15 mg/dL (ref 6–20)
CO2: 22 mmol/L (ref 22–32)
CREATININE: 1.13 mg/dL (ref 0.61–1.24)
Calcium: 9.1 mg/dL (ref 8.9–10.3)
Chloride: 102 mmol/L (ref 101–111)
GFR calc Af Amer: >60 mL/min (ref >60)
GFR calc non Af Amer: >60 mL/min (ref >60)
GLUCOSE: 106 mg/dL — AB (ref 65–99)
Potassium: 4.2 mmol/L (ref 3.5–5.1)
SODIUM: 138 mmol/L (ref 135–145)
Total Bilirubin: 0.5 mg/dL (ref 0.3–1.2)
Total Protein: 7.4 g/dL (ref 6.5–8.1)

## 2017-11-03 LAB — CBC WITH DIFFERENTIAL/PLATELET
BASOS ABS: 0 10*3/uL (ref 0.0–0.1)
BASOS PCT: 0 %
EOS ABS: 0.2 10*3/uL (ref 0.0–0.7)
Eosinophils Relative: 4 %
HCT: 44.7 % (ref 39.0–52.0)
HEMOGLOBIN: 14.8 g/dL (ref 13.0–17.0)
LYMPHS ABS: 1.4 10*3/uL (ref 0.7–4.0)
Lymphocytes Relative: 27 %
MCH: 30.1 pg (ref 26.0–34.0)
MCHC: 33.1 g/dL (ref 30.0–36.0)
MCV: 91 fL (ref 78.0–100.0)
Monocytes Absolute: 0.5 10*3/uL (ref 0.1–1.0)
Monocytes Relative: 9 %
NEUTROS PCT: 60 %
Neutro Abs: 3.1 10*3/uL (ref 1.7–7.7)
Platelets: 166 10*3/uL (ref 150–400)
RBC: 4.91 MIL/uL (ref 4.22–5.81)
RDW: 13.5 % (ref 11.5–15.5)
WBC: 5.2 10*3/uL (ref 4.0–10.5)

## 2017-11-03 LAB — LACTATE DEHYDROGENASE: LDH: 143 U/L (ref 98–192)

## 2017-11-03 MED ORDER — DENOSUMAB 120 MG/1.7ML ~~LOC~~ SOLN
120.0000 mg | Freq: Once | SUBCUTANEOUS | Status: AC
  Administered 2017-11-03: 120 mg via SUBCUTANEOUS
  Filled 2017-11-03: qty 1.7

## 2017-11-03 MED ORDER — HYDROCODONE-ACETAMINOPHEN 10-325 MG PO TABS: 1.0000 | ORAL_TABLET | ORAL | 0 refills | Status: DC | PRN

## 2017-11-03 MED ORDER — FENTANYL 75 MCG/HR TD PT72: 75.0000 ug | MEDICATED_PATCH | TRANSDERMAL | 0 refills | Status: DC

## 2017-11-03 NOTE — Progress Notes (Signed)
Matthew [name] presents today for injection per MD orders. Xgeva 120 mg administered SQ in left Abdomen. Administration without incident. Patient tolerated well.

## 2017-11-03 NOTE — Progress Notes (Signed)
Diagnosis Prostate carcinoma ([place]) - Plan: CT Chest W Contrast, CT Abdomen Pelvis W Contrast, NM Bone Scan Whole Body, HYDROcodone-acetaminophen (NORCO) 10-325 MG tablet  Bone metastases ([facility]) - Plan: HYDROcodone-acetaminophen (NORCO) 10-325 MG tablet  Staging Cancer Staging Prostate carcinoma [facility]) Staging form: Prostate, AJCC 7th Edition - Clinical stage from 12/24/2015: Stage IV (TX, N0, M1b, PSA: 20 or greater) - Signed by Baird Cancer, PA-C on 01/07/2016   Assessment and Plan:   1.  Prostate carcinoma ([place]).  62 y.o. male with prostate cancer metastatic to the bone, previously treated with docetaxel chemotherapy and currently receiving androgen deprivation therapy with [name] administered every 3 months as well as denosumab for skeletal support.  Patient was previously seen by Dr. Lebron Conners.  He continues to report pain.   Review of chart shows he has not had imaging since October 2017.  He will be set up for CT chest, abdomen, pelvis and bone scan for interval evaluation.   He will return to clinic to go over the results.   2.  Bone pain.  He reports he needs refills on Norco and fentanyl. Rxs given today.    3.  Constipation.  This is likely due to chronic opioid use.  When questioned however, he has not had colonoscopy performed.  He will be referred to GI.  No significant changes in the symptoms since the last visit.  Patient is suffering from significant constipation likely due to his chronic opioid use.  Stool softeners recommended.  4.  Shortness of breath.  He had a recent CT angio done February 2019 that showed no PE.  He had diffuse bone metastases and evidence of emphysema.  Pulse ox 99% on room air.  He is set up for CT chest for further evaluation.  Current Status: Patient is seen today for follow-up.  He continues to complain of pain.  He reports he needs refills on Norco and fentanyl.  He is also complaining of ongoing problems with constipation.    Prostate carcinoma  ([place])   12/10/2015 Imaging    CTA chest, multiple thoracic and rib osseous lesions. no primary evident. Atherosclerosis including aortic and CAD      12/12/2015 Imaging    CT abdomen/pelvis with sclerotic osseous lesions througout the lumbar spine, sacrum, bilateral iliac bones and L proximal femur worrisome for sclerotic osseous mets,mild prostatomegaly, no LAD, Infrarenal 3.8 cm AAA      12/12/2015 Tumor Marker    PSA 153.85      12/18/2015 Imaging    Bone Scan Diffuse bony metastatic disease, posterior calvarium, sternum, thoracic, lumbar spine, bilateral ribs, L shoulder, bilateral bony pelvis, proximal L femur      12/24/2015 Initial Biopsy    CT biopsy of L iliac bone lesion      12/26/2015 Pathology Results    Bone, biopsy, left iliac - POSITIVE FOR METASTATIC ADENOCARCINOMA.      12/28/2015 - 09/01/2016 Chemotherapy    Firmagon instituted 240 mg       01/01/2016 Procedure    Port placed by IR.      01/07/2016 - 04/11/2016 Chemotherapy    Docetaxel every 21 days x 6 cycles with dose reductions due to tolerance      02/18/2016 Adverse Reaction    GI toxicity from docetaxel, dose reduced to 60 mg/m2      02/18/2016 Treatment Plan Change    Docetaxel dose reduced by 20%      03/06/2016 Procedure    Dr. Enrique Sack- Multiple extraction  of tooth numbers 3, 5, 6, 8, 9, 10, 11, 12, 20, 21, 22, 27, 28, and 31. 4 Quadrants of alveoloplasty. Bilateral mandibular lingual tori reductions.      04/09/2016 Imaging    Brain MRI 1. No intracranial metastatic disease 2. No acute intracranial abnormality 3. Findings of chronic microvascular disease      05/14/2016 Imaging    CT C/A/P Stable appearing diffuse bony metastatic disease. No new significant disease is seen Innumerable patchy sclerotic osseous metastases throughout the axial and proximal appendicular skeleton, stable in size and distribution, significantly increased in density in the interval, likely reflecting treatment  effect. 2. No new or progressive metastatic disease in the chest, abdomen or pelvis. 3. Bilateral perifissural pulmonary nodules are stable and probably benign. 4. Aortic atherosclerosis. Infrarenal 4.3 cm abdominal aortic aneurysm, minimally increased in size. Recommend followup by ultrasound in 1 year.      06/02/2016 Tumor Marker    PSA 0.99 ng/ml       - 07/31/2016 Radiation Therapy    Palliative XRT by Dr. Lianne Cure in [place].       Chemotherapy    Depo-[name] every 3 months, beginning in Feb 2018         Problem List Patient Active Problem List   Diagnosis Date Noted  . Cancer related pain [G89.3] 06/02/2016  . Burn [T30.0] 01/14/2016  . Prostate carcinoma ([place]) [C61] 12/26/2015  . Bone metastases ([place]) [C79.51] 12/26/2015    Past Medical History Past Medical History:  Diagnosis Date  . Arthritis    Left knee  . Bone cancer ([place])   . Bone metastases ([place]) 12/26/2015  . Cancer [facility])    Prostate  . DVT (deep venous thrombosis) ([place]) 2004  . Kidney stones    ~2002  . Prostate carcinoma ([place]) 12/26/2015    Past Surgical History Past Surgical History:  Procedure Laterality Date  . CIRCUMCISION  30 years ago  . MULTIPLE EXTRACTIONS WITH ALVEOLOPLASTY N/A 03/06/2016   Procedure: Extraction of tooth #'s 3,5,6,8-12,20-22, 27, 28, and 31 with alveoloplasty and bilateral mandibular tori reductions;  Surgeon: Lenn Cal, DDS;  Location: [facility];  Service: Oral Surgery;  Laterality: N/A;    Family History Family History  Problem Relation Age of Onset  . Cancer Mother      Social History  reports that he has been smoking cigarettes.  He started smoking about 52 years ago. He has a 50.00 pack-year smoking history. He quit smokeless tobacco use about 12 years ago. His smokeless tobacco use included chew. He reports that he has current or past drug history. Drug: Marijuana. He reports that he does not drink alcohol.  Medications  Current Outpatient Medications:   .  aspirin EC 81 MG tablet, Take 81 mg by mouth daily., Disp: , Rfl:  .  Calcium Carb-Cholecalciferol (CALCIUM 500 + D3) 500-600 MG-UNIT TABS, Take 2 tablets by mouth daily., Disp: 60 tablet, Rfl: 6 .  ergocalciferol (VITAMIN D2) 50000 units capsule, Take 1 capsule (50,000 Units total) by mouth every Friday., Disp: 30 capsule, Rfl: 4 .  fentaNYL (DURAGESIC - DOSED MCG/HR) 75 MCG/HR, Place 1 patch (75 mcg total) onto the skin every 3 (three) days., Disp: 5 patch, Rfl: 0 .  HYDROcodone-acetaminophen (NORCO) 10-325 MG tablet, Take 1 tablet by mouth every 4 (four) hours as needed., Disp: 80 tablet, Rfl: 0 .  lactulose (CEPHULAC) 10 g packet, Take 1 packet (10 g total) by mouth 3 (three) times daily as needed (if no bowel  movement in 48hrs)., Disp: 30 each, Rfl: 0 .  lidocaine-prilocaine (EMLA) cream, Apply a quarter size amount to port site 1 hour prior to chemo. Do not rub in. Cover with plastic wrap., Disp: 30 g, Rfl: 3 .  omeprazole (PRILOSEC) 40 MG capsule, TAKE ONE CAPSULE BY MOUTH TWICE DAILY, Disp: 60 capsule, Rfl: 3 .  prochlorperazine (COMPAZINE) 10 MG tablet, Take 1 tablet (10 mg total) by mouth every 6 (six) hours as needed for nausea or vomiting., Disp: 30 tablet, Rfl: 0 .  rosuvastatin (CRESTOR) 20 MG tablet, TAKE ONE TABLET BY MOUTH ONCE DAILY, Disp: 30 tablet, Rfl: 11 .  temazepam (RESTORIL) 15 MG capsule, Take 1 capsule (15 mg total) by mouth at bedtime as needed for sleep., Disp: 30 capsule, Rfl: 0  Allergies Meloxicam  Review of Systems Review of Systems - Oncology ROS as per HPI otherwise 12 point ROS is negative.   Physical Exam  Vitals: Temperature 98.2 heart rate 87 respirations 18 blood pressure 109/72 pulse ox 99% on room air. Wt Readings from Last 3 Encounters:  11/03/17 243 lb (110.2 kg)  08/19/17 246 lb 9.6 oz (111.9 kg)  07/22/17 242 lb 9.6 oz (110 kg)   Temp Readings from Last 3 Encounters:  11/03/17 98.2 F (36.8 C) (Oral)  10/02/17 98.1 F (36.7 C) (Oral)   09/23/17 98.6 F (37 C) (Oral)   BP Readings from Last 3 Encounters:  11/03/17 109/72  10/02/17 121/81  09/23/17 98/74   Pulse Readings from Last 3 Encounters:  11/03/17 87  10/02/17 83  09/23/17 79   Constitutional: Well-developed, well-nourished, and in no distress.   HENT: Head: Normocephalic and atraumatic.  Mouth/Throat: No oropharyngeal exudate. Mucosa moist. Eyes: Pupils are equal, round, and reactive to light. Conjunctivae are normal. No scleral icterus.  Neck: Normal range of motion. Neck supple. No JVD present.  Cardiovascular: Normal rate, regular rhythm and normal heart sounds.  Exam reveals no gallop and no friction rub.   No murmur heard. Pulmonary/Chest: Effort normal and breath sounds normal. No respiratory distress. No wheezes.No rales.  Abdominal: Soft. Bowel sounds are normal. No distension. There is no tenderness. There is no guarding.  Musculoskeletal: No edema or tenderness.  Lymphadenopathy: No cervical, axillary or supraclavicular adenopathy.  Neurological: Alert and oriented to person, place, and time. No cranial nerve deficit.  Skin: Skin is warm and dry. No rash noted. No erythema. No pallor.  Psychiatric: Affect and judgment normal.   Labs Lab on 11/03/2017  Component Date Value Ref Range Status  . WBC 11/03/2017 5.2  4.0 - 10.5 K/uL Final  . RBC 11/03/2017 4.91  4.22 - 5.81 MIL/uL Final  . Hemoglobin 11/03/2017 14.8  13.0 - 17.0 g/dL Final  . HCT 11/03/2017 44.7  39.0 - 52.0 % Final  . MCV 11/03/2017 91.0  78.0 - 100.0 fL Final  . MCH 11/03/2017 30.1  26.0 - 34.0 pg Final  . MCHC 11/03/2017 33.1  30.0 - 36.0 g/dL Final  . RDW 11/03/2017 13.5  11.5 - 15.5 % Final  . Platelets 11/03/2017 166  150 - 400 K/uL Final  . Neutrophils Relative % 11/03/2017 60  % Final  . Neutro Abs 11/03/2017 3.1  1.7 - 7.7 K/uL Final  . Lymphocytes Relative 11/03/2017 27  % Final  . Lymphs Abs 11/03/2017 1.4  0.7 - 4.0 K/uL Final  . Monocytes Relative 11/03/2017 9  %  Final  . Monocytes Absolute 11/03/2017 0.5  0.1 - 1.0 K/uL Final  . Eosinophils  Relative 11/03/2017 4  % Final  . Eosinophils Absolute 11/03/2017 0.2  0.0 - 0.7 K/uL Final  . Basophils Relative 11/03/2017 0  % Final  . Basophils Absolute 11/03/2017 0.0  0.0 - 0.1 K/uL Final   Performed at [facility], [address], [place], [place] [address]  . Sodium 11/03/2017 138  135 - 145 mmol/L Final  . Potassium 11/03/2017 4.2  3.5 - 5.1 mmol/L Final  . Chloride 11/03/2017 102  101 - 111 mmol/L Final  . CO2 11/03/2017 22  22 - 32 mmol/L Final  . Glucose, Bld 11/03/2017 106* 65 - 99 mg/dL Final  . BUN 11/03/2017 15  6 - 20 mg/dL Final  . Creatinine, Ser 11/03/2017 1.13  0.61 - 1.24 mg/dL Final  . Calcium 11/03/2017 9.1  8.9 - 10.3 mg/dL Final  . Total Protein 11/03/2017 7.4  6.5 - 8.1 g/dL Final  . Albumin 11/03/2017 3.9  3.5 - 5.0 g/dL Final  . AST 11/03/2017 16  15 - 41 U/L Final  . ALT 11/03/2017 15* 17 - 63 U/L Final  . Alkaline Phosphatase 11/03/2017 65  38 - 126 U/L Final  . Total Bilirubin 11/03/2017 0.5  0.3 - 1.2 mg/dL Final  . GFR calc non Af Amer 11/03/2017 >60  >60 mL/min Final  . GFR calc Af Amer 11/03/2017 >60  >60 mL/min Final   Comment: (NOTE) The eGFR has been calculated using the CKD EPI equation. This calculation has not been validated in all clinical situations. eGFR's persistently <60 mL/min signify possible Chronic Kidney Disease.   Georgiann Hahn gap 11/03/2017 14  5 - 15 Final   Performed at [facility], [address] [place], [place] [address]  . LDH 11/03/2017 143  98 - 192 U/L Final   Performed at [facility], [address] [place], [place] [address]     Pathology Orders Placed This Encounter  Procedures  . CT Chest W Contrast    Standing Status:   Future    Standing Expiration Date:   11/03/2018    Order Specific Question:   If indicated for the ordered procedure, I authorize the administration of contrast media per Radiology protocol    Answer:   Yes    Order  Specific Question:   Preferred imaging location?    Answer:   [facility]    Order Specific Question:   Radiology Contrast Protocol - do NOT remove file path    Answer:   [email or web address]    Order Specific Question:   Reason for Exam additional comments    Answer:   prostate cancer  . CT Abdomen Pelvis W Contrast    Standing Status:   Future    Standing Expiration Date:   11/03/2018    Order Specific Question:   If indicated for the ordered procedure, I authorize the administration of contrast media per Radiology protocol    Answer:   Yes    Order Specific Question:   Preferred imaging location?    Answer:   [facility]    Order Specific Question:   Radiology Contrast Protocol - do NOT remove file path    Answer:   \\[email or web address]  . NM Bone Scan Whole Body    Standing Status:   Future    Standing Expiration Date:   11/03/2018    Order Specific Question:   If indicated for the ordered procedure, I authorize the administration of a radiopharmaceutical per Radiology protocol    Answer:  Yes    Order Specific Question:   Preferred imaging location?    Answer:   [facility]    Order Specific Question:   Radiology Contrast Protocol - do NOT remove file path    Answer:   [email or web address]       Zoila Shutter MD

## 2017-11-03 NOTE — Patient Instructions (Signed)
[place] at [facility] Discharge Instructions   You were seen today by Dr. Zoila Shutter. We will refer you to GI for constipation. We will schedule you for a CT scan and Bone scan.  We will see you back in 2 weeks for follow up. Continue Xgeva. Refill given today for pain medications    Thank you for choosing [place] at [facility] to provide your oncology and hematology care.  To afford each patient quality time with our provider, please arrive at least 15 minutes before your scheduled appointment time.    If you have a lab appointment with the [facility] please come in thru the  Main Entrance and check in at the main information desk  You need to re-schedule your appointment should you arrive 10 or more minutes late.  We strive to give you quality time with our providers, and arriving late affects you and other patients whose appointments are after yours.  Also, if you no show three or more times for appointments you may be dismissed from the clinic at the providers discretion.     Again, thank you for choosing [facility].  Our hope is that these requests will decrease the amount of time that you wait before being seen by our physicians.       _____________________________________________________________  Should you have questions after your visit to [facility], please contact our office at [phone] between the hours of 8:30 a.m. and 4:30 p.m.  Voicemails left after 4:30 p.m. will not be returned until the following business day.  For prescription refill requests, have your pharmacy contact our office.       Resources For Cancer Patients and their Caregivers ? American Cancer Society: Can assist with transportation, wigs, general needs, runs Look Good Feel Better.        (803)275-6624 ? Cancer Care: Provides financial assistance, online support groups, medication/co-pay assistance.   1-800-813-HOPE (709) 100-6779) ? [place] Assists [place] cancer patients and their families through emotional , educational and financial support.  816-129-2922 ? [place] DSS Where to apply for food stamps, Medicaid and utility assistance. 616 722 4855 ? [facility]: Transportation to medical appointments. 727-022-1225 ? Social Security Administration: May apply for disability if have a Stage IV cancer. 930 791 2967 (281) 141-7751 ? [employer], Disability and Transit Services: Assists with nutrition, care and transit needs. [place] Support Programs:   > Cancer Support Group  2nd Tuesday of the month 1pm-2pm, Journey Room   > Creative Journey  3rd Tuesday of the month 1130am-1pm, Journey Room

## 2017-11-04 ENCOUNTER — Encounter: Payer: Self-pay | Admitting: Internal Medicine

## 2017-11-10 ENCOUNTER — Encounter (HOSPITAL_COMMUNITY): Payer: BLUE CROSS/BLUE SHIELD

## 2017-11-13 ENCOUNTER — Encounter (HOSPITAL_COMMUNITY): Payer: Self-pay

## 2017-11-13 ENCOUNTER — Encounter (HOSPITAL_COMMUNITY)
Admission: RE | Admit: 2017-11-13 | Discharge: 2017-11-13 | Disposition: A | Discharge status: Home / Self Care | Payer: BLUE CROSS/BLUE SHIELD | Source: Ambulatory Visit | Attending: Internal Medicine | Admitting: Internal Medicine

## 2017-11-13 DIAGNOSIS — C61 Malignant neoplasm of prostate: Secondary | ICD-10-CM | POA: Diagnosis present

## 2017-11-13 MED ORDER — TECHNETIUM TC 99M MEDRONATE IV KIT
20.0000 | PACK | Freq: Once | INTRAVENOUS | Status: AC | PRN
  Administered 2017-11-13: 21 via INTRAVENOUS

## 2017-11-17 ENCOUNTER — Ambulatory Visit (HOSPITAL_COMMUNITY)
Admission: RE | Admit: 2017-11-17 | Discharge: 2017-11-17 | Disposition: A | Discharge status: Home / Self Care | Payer: BLUE CROSS/BLUE SHIELD | Source: Ambulatory Visit | Attending: Internal Medicine | Admitting: Internal Medicine

## 2017-11-17 DIAGNOSIS — C61 Malignant neoplasm of prostate: Secondary | ICD-10-CM

## 2017-11-17 DIAGNOSIS — C7951 Secondary malignant neoplasm of bone: Secondary | ICD-10-CM | POA: Diagnosis not present

## 2017-11-17 DIAGNOSIS — I714 Abdominal aortic aneurysm, without rupture: Secondary | ICD-10-CM | POA: Diagnosis not present

## 2017-11-17 MED ORDER — IOPAMIDOL (ISOVUE-300) INJECTION 61%
100.0000 mL | Freq: Once | INTRAVENOUS | Status: AC | PRN
  Administered 2017-11-17: 100 mL via INTRAVENOUS

## 2017-11-23 ENCOUNTER — Inpatient Hospital Stay (HOSPITAL_COMMUNITY): Payer: BLUE CROSS/BLUE SHIELD | Attending: Hematology | Admitting: Hematology

## 2017-11-23 ENCOUNTER — Other Ambulatory Visit (HOSPITAL_COMMUNITY): Payer: Self-pay

## 2017-11-23 ENCOUNTER — Encounter (HOSPITAL_COMMUNITY): Payer: Self-pay

## 2017-11-23 ENCOUNTER — Ambulatory Visit (HOSPITAL_COMMUNITY): Payer: Self-pay

## 2017-11-23 ENCOUNTER — Encounter (HOSPITAL_COMMUNITY): Payer: Self-pay | Admitting: Hematology

## 2017-11-23 VITALS — BP 117/76 | HR 85 | Temp 98.2°F | Resp 18 | Wt 246.6 lb

## 2017-11-23 DIAGNOSIS — F17210 Nicotine dependence, cigarettes, uncomplicated: Secondary | ICD-10-CM | POA: Diagnosis not present

## 2017-11-23 DIAGNOSIS — R209 Unspecified disturbances of skin sensation: Secondary | ICD-10-CM | POA: Diagnosis not present

## 2017-11-23 DIAGNOSIS — Z720 Tobacco use: Secondary | ICD-10-CM | POA: Diagnosis not present

## 2017-11-23 DIAGNOSIS — C7951 Secondary malignant neoplasm of bone: Secondary | ICD-10-CM | POA: Diagnosis not present

## 2017-11-23 DIAGNOSIS — C61 Malignant neoplasm of prostate: Secondary | ICD-10-CM

## 2017-11-23 DIAGNOSIS — G4700 Insomnia, unspecified: Secondary | ICD-10-CM

## 2017-11-23 DIAGNOSIS — R0602 Shortness of breath: Secondary | ICD-10-CM | POA: Diagnosis not present

## 2017-11-23 DIAGNOSIS — G893 Neoplasm related pain (acute) (chronic): Secondary | ICD-10-CM | POA: Diagnosis not present

## 2017-11-23 DIAGNOSIS — Z86718 Personal history of other venous thrombosis and embolism: Secondary | ICD-10-CM

## 2017-11-23 DIAGNOSIS — R42 Dizziness and giddiness: Secondary | ICD-10-CM

## 2017-11-23 DIAGNOSIS — R5383 Other fatigue: Secondary | ICD-10-CM

## 2017-11-23 DIAGNOSIS — R110 Nausea: Secondary | ICD-10-CM | POA: Diagnosis not present

## 2017-11-23 MED ORDER — PREDNISONE 5 MG PO TABS: 5.0000 mg | ORAL_TABLET | Freq: Two times a day (BID) | ORAL | 0 refills | Status: AC

## 2017-11-23 MED ORDER — ABIRATERONE ACETATE 250 MG PO TABS: 1000.0000 mg | ORAL_TABLET | Freq: Every day | ORAL | 0 refills | Status: DC

## 2017-11-23 MED ORDER — FENTANYL 75 MCG/HR TD PT72: 75.0000 ug | MEDICATED_PATCH | TRANSDERMAL | 0 refills | Status: DC

## 2017-11-23 NOTE — Progress Notes (Signed)
Matthew [name], [place] [address]   CLINIC:  Medical Oncology/Hematology  PCP:  [name], [place] [address] [phone]   REASON FOR VISIT:  Follow-up for metastatic prostate cancer.  CURRENT THERAPY: [name] and denosumab injections.  BRIEF ONCOLOGIC HISTORY:    Prostate carcinoma ([place])   12/10/2015 Imaging    CTA chest, multiple thoracic and rib osseous lesions. no primary evident. Atherosclerosis including aortic and CAD      12/12/2015 Imaging    CT abdomen/pelvis with sclerotic osseous lesions througout the lumbar spine, sacrum, bilateral iliac bones and L proximal femur worrisome for sclerotic osseous mets,mild prostatomegaly, no LAD, Infrarenal 3.8 cm AAA      12/12/2015 Tumor Marker    PSA 153.85      12/18/2015 Imaging    Bone Scan Diffuse bony metastatic disease, posterior calvarium, sternum, thoracic, lumbar spine, bilateral ribs, L shoulder, bilateral bony pelvis, proximal L femur      12/24/2015 Initial Biopsy    CT biopsy of L iliac bone lesion      12/26/2015 Pathology Results    Bone, biopsy, left iliac - POSITIVE FOR METASTATIC ADENOCARCINOMA.      12/28/2015 - 09/01/2016 Chemotherapy    Firmagon instituted 240 mg       01/01/2016 Procedure    Port placed by IR.      01/07/2016 - 04/11/2016 Chemotherapy    Docetaxel every 21 days x 6 cycles with dose reductions due to tolerance      02/18/2016 Adverse Reaction    GI toxicity from docetaxel, dose reduced to 60 mg/m2      02/18/2016 Treatment Plan Change    Docetaxel dose reduced by 20%      03/06/2016 Procedure    Dr. Enrique Sack- Multiple extraction of tooth numbers 3, 5, 6, 8, 9, 10, 11, 12, 20, 21, 22, 27, 28, and 31. 4 Quadrants of alveoloplasty. Bilateral mandibular lingual tori reductions.      04/09/2016 Imaging    Brain MRI 1. No intracranial metastatic disease 2. No acute intracranial abnormality 3. Findings of chronic  microvascular disease      05/14/2016 Imaging    CT C/A/P Stable appearing diffuse bony metastatic disease. No new significant disease is seen Innumerable patchy sclerotic osseous metastases throughout the axial and proximal appendicular skeleton, stable in size and distribution, significantly increased in density in the interval, likely reflecting treatment effect. 2. No new or progressive metastatic disease in the chest, abdomen or pelvis. 3. Bilateral perifissural pulmonary nodules are stable and probably benign. 4. Aortic atherosclerosis. Infrarenal 4.3 cm abdominal aortic aneurysm, minimally increased in size. Recommend followup by ultrasound in 1 year.      06/02/2016 Tumor Marker    PSA 0.99 ng/ml       - 07/31/2016 Radiation Therapy    Palliative XRT by Dr. Lianne Cure in [place].       Chemotherapy    Depo-[name] every 3 months, beginning in Feb 2018         CANCER STAGING: Cancer Staging Prostate carcinoma [facility]) Staging form: Prostate, AJCC 7th Edition - Clinical stage from 12/24/2015: Stage IV (TX, N0, M1b, PSA: 20 or greater) - Signed by Baird Cancer, PA-C on 01/07/2016    INTERVAL HISTORY:  Matthew [name] 62 y.o. male returns for routine follow-up of his metastatic prostate cancer.  He recently had scans done.  His pain is controlled well on the fentanyl patch.  He is having difficulty  sleeping.  He is tolerating denosumab very well.  He denies any fevers or infections.  No recent hospitalizations.  He is taking calcium and vitamin D supplements.  His appetite has been stable.  He denies any previous history of seizures.  REVIEW OF SYSTEMS:  Review of Systems  Constitutional: Positive for fatigue.  HENT:  Negative.   Respiratory: Positive for shortness of breath.   Cardiovascular: Negative.   Gastrointestinal: Positive for nausea.  Genitourinary: Negative.    Musculoskeletal: Negative.   Skin: Negative.   Neurological: Positive for dizziness and numbness.    Psychiatric/Behavioral: Negative.      PAST MEDICAL/SURGICAL HISTORY:  Past Medical History:  Diagnosis Date  . Arthritis    Left knee  . Bone cancer ([place])   . Bone metastases ([place]) 12/26/2015  . Cancer [facility])    Prostate  . DVT (deep venous thrombosis) ([place]) 2004  . Kidney stones    ~2002  . Prostate carcinoma ([place]) 12/26/2015   Past Surgical History:  Procedure Laterality Date  . CIRCUMCISION  30 years ago  . MULTIPLE EXTRACTIONS WITH ALVEOLOPLASTY N/A 03/06/2016   Procedure: Extraction of tooth #'s 3,5,6,8-12,20-22, 27, 28, and 31 with alveoloplasty and bilateral mandibular tori reductions;  Surgeon: Lenn Cal, DDS;  Location: [facility];  Service: Oral Surgery;  Laterality: N/A;     SOCIAL HISTORY:  Social History   Socioeconomic History  . Marital status: Single    Spouse name: Not on file  . Number of children: 2  . Years of education: Not on file  . Highest education level: Not on file  Occupational History  . Occupation: Research scientist (life sciences)  Social Needs  . Financial resource strain: Not on file  . Food insecurity:    Worry: Not on file    Inability: Not on file  . Transportation needs:    Medical: Not on file    Non-medical: Not on file  Tobacco Use  . Smoking status: Current Every Day Smoker    Packs/day: 1.00    Years: 50.00    Pack years: 50.00    Types: Cigarettes    Start date: 06/16/1965  . Smokeless tobacco: Former Systems developer    Types: [place] date: 08/04/2005  Substance and Sexual Activity  . Alcohol use: No    Alcohol/week: 0.0 oz  . Drug use: Yes    Types: Marijuana    Comment: THC for appetite  . Sexual activity: Not on file  Lifestyle  . Physical activity:    Days per week: Not on file    Minutes per session: Not on file  . Stress: Not on file  Relationships  . Social connections:    Talks on phone: Not on file    Gets together: Not on file    Attends religious service: Not on file    Active member of club or organization:  Not on file    Attends meetings of clubs or organizations: Not on file    Relationship status: Not on file  . Intimate partner violence:    Fear of current or ex partner: Not on file    Emotionally abused: Not on file    Physically abused: Not on file    Forced sexual activity: Not on file  Other Topics Concern  . Not on file  Social History Narrative  . Not on file    FAMILY HISTORY:  Family History  Problem Relation Age of Onset  . Cancer Mother  CURRENT MEDICATIONS:  Outpatient Encounter Medications as of 11/23/2017  Medication Sig  . abiraterone acetate (ZYTIGA) 250 MG tablet Take 4 tablets (1,000 mg total) by mouth daily. Take on an empty stomach 1 hour before or 2 hours after a meal  . aspirin EC 81 MG tablet Take 81 mg by mouth daily.  . Calcium Carb-Cholecalciferol (CALCIUM 500 + D3) 500-600 MG-UNIT TABS Take 2 tablets by mouth daily.  . ergocalciferol (VITAMIN D2) 50000 units capsule Take 1 capsule (50,000 Units total) by mouth every Friday.  . fentaNYL (DURAGESIC - DOSED MCG/HR) 75 MCG/HR Place 1 patch (75 mcg total) onto the skin every 3 (three) days.  Marland Kitchen HYDROcodone-acetaminophen (NORCO) 10-325 MG tablet Take 1 tablet by mouth every 4 (four) hours as needed.  . lactulose (CEPHULAC) 10 g packet Take 1 packet (10 g total) by mouth 3 (three) times daily as needed (if no bowel movement in 48hrs).  . lidocaine-prilocaine (EMLA) cream Apply a quarter size amount to port site 1 hour prior to chemo. Do not rub in. Cover with plastic wrap.  . omeprazole (PRILOSEC) 40 MG capsule TAKE ONE CAPSULE BY MOUTH TWICE DAILY  . predniSONE (DELTASONE) 5 MG tablet Take 1 tablet (5 mg total) by mouth 2 (two) times daily with a meal.  . prochlorperazine (COMPAZINE) 10 MG tablet Take 1 tablet (10 mg total) by mouth every 6 (six) hours as needed for nausea or vomiting.  . rosuvastatin (CRESTOR) 20 MG tablet TAKE ONE TABLET BY MOUTH ONCE DAILY  . temazepam (RESTORIL) 15 MG capsule Take 1  capsule (15 mg total) by mouth at bedtime as needed for sleep.  . [DISCONTINUED] fentaNYL (DURAGESIC - DOSED MCG/HR) 75 MCG/HR Place 1 patch (75 mcg total) onto the skin every 3 (three) days.  . [DISCONTINUED] fentaNYL (DURAGESIC - DOSED MCG/HR) 75 MCG/HR Place 1 patch (75 mcg total) onto the skin every 3 (three) days.   No facility-administered encounter medications on file as of 11/23/2017.     ALLERGIES:  Allergies  Allergen Reactions  . Meloxicam Other (See Comments)    Caused stomach bleeding.     PHYSICAL EXAM:  ECOG Performance status: 1  Vitals:   11/23/17 1540  BP: 117/76  Pulse: 85  Resp: 18  Temp: 98.2 F (36.8 C)  SpO2: 97%   Filed Weights   11/23/17 1540  Weight: 246 lb 9.6 oz (111.9 kg)    Physical Exam   LABORATORY DATA:  I have reviewed the labs as listed.  CBC    Component Value Date/Time   WBC 5.2 11/03/2017 0859   RBC 4.91 11/03/2017 0859   HGB 14.8 11/03/2017 0859   HCT 44.7 11/03/2017 0859   PLT 166 11/03/2017 0859   MCV 91.0 11/03/2017 0859   MCH 30.1 11/03/2017 0859   MCHC 33.1 11/03/2017 0859   RDW 13.5 11/03/2017 0859   LYMPHSABS 1.4 11/03/2017 0859   MONOABS 0.5 11/03/2017 0859   EOSABS 0.2 11/03/2017 0859   BASOSABS 0.0 11/03/2017 0859   CMP Latest Ref Rng & Units 11/03/2017 09/25/2017 09/23/2017  Glucose 65 - 99 mg/dL 106(H) 103(H) 123(H)  BUN 6 - 20 mg/dL 15 14 11   Creatinine 0.61 - 1.24 mg/dL 1.13 1.11 1.09  Sodium 135 - 145 mmol/L 138 139 137  Potassium 3.5 - 5.1 mmol/L 4.2 4.0 3.4(L)  Chloride 101 - 111 mmol/L 102 105 102  CO2 22 - 32 mmol/L 22 23 21(L)  Calcium 8.9 - 10.3 mg/dL 9.1 9.0 8.5(L)  Total Protein  6.5 - 8.1 g/dL 7.4 6.7 -  Total Bilirubin 0.3 - 1.2 mg/dL 0.5 0.4 -  Alkaline Phos 38 - 126 U/L 65 60 -  AST 15 - 41 U/L 16 15 -  ALT 17 - 63 U/L 15(L) 16(L) -       DIAGNOSTIC IMAGING:  I have independently reviewed the images of CT scan and bone scan and agree with report..      ASSESSMENT & PLAN:    Prostate carcinoma ([place]) 1.  Metastatic prostate cancer to the bones: - Status post chemotherapy with docetaxel from 01/07/2016 through 04/11/2016, likely in the castration sensitive setting. -Recent rising PSA levels, patient maintained on [name], last injection 22.5 mg on 10/02/2017 -Most recent PSA of 7, CT scan and bone scan showing slight worsening of bone metastasis, no evidence of visceral metastasis -I discussed the findings of the CT scan and bone scan with the patient and his daughter in detail.  We talked about starting him on Abiraterone/enzalutamide versus chemotherapy.  As the Abiraterone has less side effects, which shows it instead of chemotherapy.  We talked about the side effects of Abiraterone including but not limited to hypertension, hypokalemia, fluid retention, and fatigue.  We will start him on prednisone 5 mg twice daily to decrease the side effects.  We will send prescription to specialty pharmacy.  I will see him back in 2-3 weeks after start of Abiraterone.  Total time spent is 40 minutes with more than 50% of the time spent face-to-face discussing scan results, treatment options and side effects.  2.  Prostate cancer related bone pain: He will continue fentanyl 75 mcg patch.  We have renewed the prescription.  3.  Bone metastasis: He will continue monthly denosumab injections.  4.  Difficulty sleeping: He is on temazepam 15 mg at bedtime.  Occasionally he takes 2 pills and it helps.  I have told him to increase it to 2 pills at bedtime.  If it does not help, we will help to switch him to a different agent.      Orders placed this encounter:  Orders Placed This Encounter  Procedures  . CBC  . Comprehensive metabolic panel  . PSA      Derek Jack, MD [place] [phone]

## 2017-11-23 NOTE — Assessment & Plan Note (Signed)
1.  Metastatic prostate cancer to the bones: - Status post chemotherapy with docetaxel from 01/07/2016 through 04/11/2016, likely in the castration sensitive setting. -Recent rising PSA levels, patient maintained on [name], last injection 22.5 mg on 10/02/2017 -Most recent PSA of 7, CT scan and bone scan showing slight worsening of bone metastasis, no evidence of visceral metastasis -I discussed the findings of the CT scan and bone scan with the patient and his daughter in detail.  We talked about starting him on Abiraterone/enzalutamide versus chemotherapy.  As the Abiraterone has less side effects, which shows it instead of chemotherapy.  We talked about the side effects of Abiraterone including but not limited to hypertension, hypokalemia, fluid retention, and fatigue.  We will start him on prednisone 5 mg twice daily to decrease the side effects.  We will send prescription to specialty pharmacy.  I will see him back in 2-3 weeks after start of Abiraterone.  Total time spent is 40 minutes with more than 50% of the time spent face-to-face discussing scan results, treatment options and side effects.  2.  Prostate cancer related bone pain: He will continue fentanyl 75 mcg patch.  We have renewed the prescription.  3.  Bone metastasis: He will continue monthly denosumab injections.  4.  Difficulty sleeping: He is on temazepam 15 mg at bedtime.  Occasionally he takes 2 pills and it helps.  I have told him to increase it to 2 pills at bedtime.  If it does not help, we will help to switch him to a different agent.

## 2017-11-23 NOTE — Patient Instructions (Signed)
[facility] at [facility] Discharge Instructions  Today you saw Dr. K.   Thank you for choosing [facility] at [facility] to provide your oncology and hematology care.  To afford each patient quality time with our provider, please arrive at least 15 minutes before your scheduled appointment time.   If you have a lab appointment with the Cancer Center please come in thru the  Main Entrance and check in at the main information desk  You need to re-schedule your appointment should you arrive 10 or more minutes late.  We strive to give you quality time with our providers, and arriving late affects you and other patients whose appointments are after yours.  Also, if you no show three or more times for appointments you may be dismissed from the clinic at the providers discretion.     Again, thank you for choosing [facility].  Our hope is that these requests will decrease the amount of time that you wait before being seen by our physicians.       _____________________________________________________________  Should you have questions after your visit to [facility], please contact our office at [phone] between the hours of 8:30 a.m. and 4:30 p.m.  Voicemails left after 4:30 p.m. will not be returned until the following business day.  For prescription refill requests, have your pharmacy contact our office.       Resources For Cancer Patients and their Caregivers ? American Cancer Society: Can assist with transportation, wigs, general needs, runs Look Good Feel Better.        1-888-227-6333 ? Cancer Care: Provides financial assistance, online support groups, medication/co-pay assistance.  1-800-813-HOPE (4673) ? [facility] Assists [place] cancer patients and their families through emotional , educational and financial support.  336-427-4357 ? [place] DSS Where to apply for food  stamps, Medicaid and utility assistance. 336-342-1394 ? RCATS: Transportation to medical appointments. 336-347-2287 ? Social Security Administration: May apply for disability if have a Stage IV cancer. 336-342-7796 1-800-772-1213 ? [place] Aging, Disability and Transit Services: Assists with nutrition, care and transit needs. 336-349-2343  Cancer Center Support Programs:   > Cancer Support Group  2nd Tuesday of the month 1pm-2pm, Journey Room   > Creative Journey  3rd Tuesday of the month 1130am-1pm, Journey Room    

## 2017-11-25 ENCOUNTER — Other Ambulatory Visit (HOSPITAL_COMMUNITY): Payer: Self-pay

## 2017-11-25 DIAGNOSIS — C61 Malignant neoplasm of prostate: Secondary | ICD-10-CM

## 2017-11-25 MED ORDER — ABIRATERONE ACETATE 250 MG PO TABS: 1000.0000 mg | ORAL_TABLET | Freq: Every day | ORAL | 0 refills | Status: DC

## 2017-11-25 NOTE — Telephone Encounter (Signed)
Patient needed prescription for Zytiga. MD filled yesterday but he e-scribed it the local [facility]. Zytiga prescriptions must be printed and faxed to specialty pharmacy. Prescription printed for MD to sign.

## 2017-11-30 ENCOUNTER — Inpatient Hospital Stay (HOSPITAL_COMMUNITY): Payer: BLUE CROSS/BLUE SHIELD

## 2017-11-30 ENCOUNTER — Encounter (HOSPITAL_COMMUNITY): Payer: Self-pay

## 2017-11-30 ENCOUNTER — Other Ambulatory Visit: Payer: Self-pay

## 2017-11-30 ENCOUNTER — Ambulatory Visit (HOSPITAL_COMMUNITY): Payer: Self-pay | Admitting: Hematology

## 2017-11-30 VITALS — BP 118/74 | HR 82 | Temp 98.0°F | Resp 18

## 2017-11-30 DIAGNOSIS — C61 Malignant neoplasm of prostate: Secondary | ICD-10-CM

## 2017-11-30 DIAGNOSIS — C7951 Secondary malignant neoplasm of bone: Secondary | ICD-10-CM | POA: Diagnosis not present

## 2017-11-30 DIAGNOSIS — Z95828 Presence of other vascular implants and grafts: Secondary | ICD-10-CM

## 2017-11-30 LAB — COMPREHENSIVE METABOLIC PANEL
ALK PHOS: 60 U/L (ref 38–126)
ALT: 14 U/L — ABNORMAL LOW (ref 17–63)
AST: 15 U/L (ref 15–41)
Albumin: 3.8 g/dL (ref 3.5–5.0)
Anion gap: 9 (ref 5–15)
BILIRUBIN TOTAL: 0.4 mg/dL (ref 0.3–1.2)
BUN: 13 mg/dL (ref 6–20)
CALCIUM: 9.1 mg/dL (ref 8.9–10.3)
CO2: 22 mmol/L (ref 22–32)
CREATININE: 0.98 mg/dL (ref 0.61–1.24)
Chloride: 107 mmol/L (ref 101–111)
GFR calc Af Amer: >60 mL/min (ref >60)
GFR calc non Af Amer: >60 mL/min (ref >60)
Glucose, Bld: 117 mg/dL — ABNORMAL HIGH (ref 65–99)
Potassium: 4.4 mmol/L (ref 3.5–5.1)
Sodium: 138 mmol/L (ref 135–145)
TOTAL PROTEIN: 6.9 g/dL (ref 6.5–8.1)

## 2017-11-30 LAB — CBC WITH DIFFERENTIAL/PLATELET
BASOS ABS: 0 10*3/uL (ref 0.0–0.1)
BASOS PCT: 0 %
Eosinophils Absolute: 0.2 10*3/uL (ref 0.0–0.7)
Eosinophils Relative: 4 %
HEMATOCRIT: 40.7 % (ref 39.0–52.0)
HEMOGLOBIN: 13.4 g/dL (ref 13.0–17.0)
LYMPHS PCT: 21 %
Lymphs Abs: 1 10*3/uL (ref 0.7–4.0)
MCH: 30 pg (ref 26.0–34.0)
MCHC: 32.9 g/dL (ref 30.0–36.0)
MCV: 91.1 fL (ref 78.0–100.0)
MONOS PCT: 8 %
Monocytes Absolute: 0.3 10*3/uL (ref 0.1–1.0)
NEUTROS ABS: 3.1 10*3/uL (ref 1.7–7.7)
NEUTROS PCT: 67 %
Platelets: 184 10*3/uL (ref 150–400)
RBC: 4.47 MIL/uL (ref 4.22–5.81)
RDW: 13.3 % (ref 11.5–15.5)
WBC: 4.6 10*3/uL (ref 4.0–10.5)

## 2017-11-30 LAB — PSA: Prostatic Specific Antigen: 17.63 ng/mL — ABNORMAL HIGH (ref 0.00–4.00)

## 2017-11-30 MED ORDER — DENOSUMAB 120 MG/1.7ML ~~LOC~~ SOLN
120.0000 mg | Freq: Once | SUBCUTANEOUS | Status: AC
  Administered 2017-11-30: 120 mg via SUBCUTANEOUS
  Filled 2017-11-30: qty 1.7

## 2017-11-30 MED ORDER — HEPARIN SOD (PORK) LOCK FLUSH 100 UNIT/ML IV SOLN
500.0000 [IU] | Freq: Once | INTRAVENOUS | Status: AC
  Administered 2017-11-30: 500 [IU] via INTRAVENOUS

## 2017-11-30 MED ORDER — SODIUM CHLORIDE 0.9% FLUSH
10.0000 mL | INTRAVENOUS | Status: DC | PRN
  Administered 2017-11-30: 10 mL via INTRAVENOUS
  Filled 2017-11-30: qty 10

## 2017-11-30 NOTE — Progress Notes (Signed)
Matthew [name] presents today for injection per the provider's orders.  Xgeva administration without incident; see MAR for injection details.  Patient tolerated procedure well and without incident.  No questions or complaints noted at this time.  Leonette Most presented for Portacath access and flush.  Portacath located right chest wall accessed with  H 20 needle.  Good blood return present. Portacath flushed with 85ml NS and 500U/19ml Heparin and needle removed intact.  Procedure tolerated well and without incident.  Discharged ambulatory in c/o family.

## 2017-12-01 ENCOUNTER — Encounter (HOSPITAL_COMMUNITY): Payer: Self-pay | Admitting: Adult Health

## 2017-12-01 ENCOUNTER — Other Ambulatory Visit (HOSPITAL_COMMUNITY): Payer: Self-pay | Admitting: Adult Health

## 2017-12-01 ENCOUNTER — Telehealth (HOSPITAL_COMMUNITY): Payer: Self-pay

## 2017-12-01 DIAGNOSIS — C7951 Secondary malignant neoplasm of bone: Secondary | ICD-10-CM

## 2017-12-01 DIAGNOSIS — C61 Malignant neoplasm of prostate: Secondary | ICD-10-CM

## 2017-12-01 MED ORDER — HYDROCODONE-ACETAMINOPHEN 10-325 MG PO TABS: 1.0000 | ORAL_TABLET | ORAL | 0 refills | Status: DC | PRN

## 2017-12-01 NOTE — Telephone Encounter (Signed)
Patient called stating he needed a refill on his hydrocodone and wanted something for depression. Reviewed with Dr. Delton Coombes who refilled hydrocodone and said to follow up with PCP for depression medication. Notified patient who verbalized understanding.

## 2017-12-01 NOTE — Progress Notes (Signed)
Patient called cancer center requesting refill of Norco.   [place] Controlled Substance Reporting System reviewed and refill is appropriate on or after 12/01/17. Medication e-scribed to his pharmacy [facility]) using [employer] 2-step verification process.    [place]CCSRS reviewed:    Mike Craze, NP [place] [phone]

## 2017-12-03 ENCOUNTER — Telehealth (HOSPITAL_COMMUNITY): Payer: Self-pay | Admitting: Hematology

## 2017-12-03 NOTE — Telephone Encounter (Signed)
PTS ZYTIGA WAS SHIPPED ON 12/02/17.

## 2017-12-24 ENCOUNTER — Other Ambulatory Visit (HOSPITAL_COMMUNITY): Payer: Self-pay | Admitting: Emergency Medicine

## 2017-12-24 DIAGNOSIS — C61 Malignant neoplasm of prostate: Secondary | ICD-10-CM

## 2017-12-24 MED ORDER — ABIRATERONE ACETATE 250 MG PO TABS: 1000.0000 mg | ORAL_TABLET | Freq: Every day | ORAL | 0 refills | Status: DC

## 2017-12-24 NOTE — Progress Notes (Signed)
zytiga refilled 

## 2017-12-31 ENCOUNTER — Ambulatory Visit (HOSPITAL_COMMUNITY): Payer: Self-pay | Admitting: Hematology

## 2017-12-31 ENCOUNTER — Ambulatory Visit (HOSPITAL_COMMUNITY): Payer: Self-pay

## 2017-12-31 ENCOUNTER — Other Ambulatory Visit (HOSPITAL_COMMUNITY): Payer: Self-pay

## 2018-01-01 ENCOUNTER — Inpatient Hospital Stay (HOSPITAL_COMMUNITY): Payer: BLUE CROSS/BLUE SHIELD | Attending: Hematology and Oncology | Admitting: Hematology

## 2018-01-01 ENCOUNTER — Encounter (HOSPITAL_COMMUNITY): Payer: Self-pay | Admitting: Hematology

## 2018-01-01 ENCOUNTER — Inpatient Hospital Stay (HOSPITAL_COMMUNITY): Payer: BLUE CROSS/BLUE SHIELD | Attending: Hematology

## 2018-01-01 ENCOUNTER — Other Ambulatory Visit: Payer: Self-pay

## 2018-01-01 ENCOUNTER — Inpatient Hospital Stay (HOSPITAL_COMMUNITY): Payer: BLUE CROSS/BLUE SHIELD

## 2018-01-01 VITALS — BP 113/75 | HR 74 | Temp 98.0°F | Resp 18 | Wt 236.1 lb

## 2018-01-01 DIAGNOSIS — F17210 Nicotine dependence, cigarettes, uncomplicated: Secondary | ICD-10-CM

## 2018-01-01 DIAGNOSIS — G893 Neoplasm related pain (acute) (chronic): Secondary | ICD-10-CM

## 2018-01-01 DIAGNOSIS — R112 Nausea with vomiting, unspecified: Secondary | ICD-10-CM

## 2018-01-01 DIAGNOSIS — C61 Malignant neoplasm of prostate: Secondary | ICD-10-CM

## 2018-01-01 DIAGNOSIS — C7951 Secondary malignant neoplasm of bone: Secondary | ICD-10-CM | POA: Insufficient documentation

## 2018-01-01 DIAGNOSIS — R5383 Other fatigue: Secondary | ICD-10-CM

## 2018-01-01 DIAGNOSIS — M549 Dorsalgia, unspecified: Secondary | ICD-10-CM

## 2018-01-01 DIAGNOSIS — G4700 Insomnia, unspecified: Secondary | ICD-10-CM | POA: Diagnosis not present

## 2018-01-01 DIAGNOSIS — Z5111 Encounter for antineoplastic chemotherapy: Secondary | ICD-10-CM | POA: Diagnosis present

## 2018-01-01 LAB — COMPREHENSIVE METABOLIC PANEL
ALBUMIN: 3.7 g/dL (ref 3.5–5.0)
ALT: 14 U/L — ABNORMAL LOW (ref 17–63)
ANION GAP: 8 (ref 5–15)
AST: 19 U/L (ref 15–41)
Alkaline Phosphatase: 71 U/L (ref 38–126)
BILIRUBIN TOTAL: 0.5 mg/dL (ref 0.3–1.2)
BUN: 12 mg/dL (ref 6–20)
CALCIUM: 8.8 mg/dL — AB (ref 8.9–10.3)
CO2: 24 mmol/L (ref 22–32)
Chloride: 107 mmol/L (ref 101–111)
Creatinine, Ser: 1.13 mg/dL (ref 0.61–1.24)
GFR calc non Af Amer: >60 mL/min (ref >60)
Glucose, Bld: 114 mg/dL — ABNORMAL HIGH (ref 65–99)
POTASSIUM: 3.8 mmol/L (ref 3.5–5.1)
Sodium: 139 mmol/L (ref 135–145)
TOTAL PROTEIN: 6.6 g/dL (ref 6.5–8.1)

## 2018-01-01 LAB — CBC
HEMATOCRIT: 39.8 % (ref 39.0–52.0)
Hemoglobin: 12.9 g/dL — ABNORMAL LOW (ref 13.0–17.0)
MCH: 29.7 pg (ref 26.0–34.0)
MCHC: 32.4 g/dL (ref 30.0–36.0)
MCV: 91.5 fL (ref 78.0–100.0)
Platelets: 155 10*3/uL (ref 150–400)
RBC: 4.35 MIL/uL (ref 4.22–5.81)
RDW: 13.7 % (ref 11.5–15.5)
WBC: 4.3 10*3/uL (ref 4.0–10.5)

## 2018-01-01 LAB — PSA: PROSTATIC SPECIFIC ANTIGEN: 4.39 ng/mL — AB (ref 0.00–4.00)

## 2018-01-01 MED ORDER — LEUPROLIDE ACETATE (3 MONTH) 22.5 MG IM KIT
22.5000 mg | PACK | Freq: Once | INTRAMUSCULAR | Status: AC
  Administered 2018-01-01: 22.5 mg via INTRAMUSCULAR
  Filled 2018-01-01: qty 22.5

## 2018-01-01 MED ORDER — FENTANYL 100 MCG/HR TD PT72: 100.0000 ug | MEDICATED_PATCH | TRANSDERMAL | 0 refills | Status: DC

## 2018-01-01 MED ORDER — DENOSUMAB 120 MG/1.7ML ~~LOC~~ SOLN
120.0000 mg | Freq: Once | SUBCUTANEOUS | Status: AC
  Administered 2018-01-01: 120 mg via SUBCUTANEOUS
  Filled 2018-01-01: qty 1.7

## 2018-01-01 MED ORDER — HYDROCODONE-ACETAMINOPHEN 10-325 MG PO TABS: 1.0000 | ORAL_TABLET | ORAL | 0 refills | Status: DC | PRN

## 2018-01-01 NOTE — Patient Instructions (Signed)
[place] at [facility] Discharge Instructions  Received Matthew [name] and [name] injections today. Follow-up as scheduled. Call clinic for any questions or concerns   Thank you for choosing [place] at [facility] to provide your oncology and hematology care.  To afford each patient quality time with our provider, please arrive at least 15 minutes before your scheduled appointment time.   If you have a lab appointment with the [facility] please come in thru the  Main Entrance and check in at the main information desk  You need to re-schedule your appointment should you arrive 10 or more minutes late.  We strive to give you quality time with our providers, and arriving late affects you and other patients whose appointments are after yours.  Also, if you no show three or more times for appointments you may be dismissed from the clinic at the providers discretion.     Again, thank you for choosing [facility].  Our hope is that these requests will decrease the amount of time that you wait before being seen by our physicians.       _____________________________________________________________  Should you have questions after your visit to [facility], please contact our office at [phone] between the hours of 8:30 a.m. and 4:30 p.m.  Voicemails left after 4:30 p.m. will not be returned until the following business day.  For prescription refill requests, have your pharmacy contact our office.       Resources For Cancer Patients and their Caregivers ? American Cancer Society: Can assist with transportation, wigs, general needs, runs Look Good Feel Better.        7025396649 ? Cancer Care: Provides financial assistance, online support groups, medication/co-pay assistance.  1-800-813-HOPE 830-270-7533) ? [facility] Assists [place] cancer patients and their families through emotional ,  educational and financial support.  978-631-4859 ? [place] DSS Where to apply for food stamps, Medicaid and utility assistance. 970 765 3090 ? RCATS: Transportation to medical appointments. (215)521-6004 ? Social Security Administration: May apply for disability if have a Stage IV cancer. 828 744 2397 816-119-7016 ? [employer], Disability and Transit Services: Assists with nutrition, care and transit needs. [facility] Support Programs:   > Cancer Support Group  2nd Tuesday of the month 1pm-2pm, Journey Room   > Creative Journey  3rd Tuesday of the month 1130am-1pm, Journey Room

## 2018-01-01 NOTE — Progress Notes (Signed)
[place] [place], [place] [address]   CLINIC:  Medical Oncology/Hematology  PCP:  [name], [place] [address] [phone]   REASON FOR VISIT:  Follow-up for metastatic prostate cancer to the bones.  CURRENT THERAPY: Abiraterone and prednisone started on 12/02/2017.  BRIEF ONCOLOGIC HISTORY:    Prostate carcinoma ([place])   12/10/2015 Imaging    CTA chest, multiple thoracic and rib osseous lesions. no primary evident. Atherosclerosis including aortic and CAD      12/12/2015 Imaging    CT abdomen/pelvis with sclerotic osseous lesions througout the lumbar spine, sacrum, bilateral iliac bones and L proximal femur worrisome for sclerotic osseous mets,mild prostatomegaly, no LAD, Infrarenal 3.8 cm AAA      12/12/2015 Tumor Marker    PSA 153.85      12/18/2015 Imaging    Bone Scan Diffuse bony metastatic disease, posterior calvarium, sternum, thoracic, lumbar spine, bilateral ribs, L shoulder, bilateral bony pelvis, proximal L femur      12/24/2015 Initial Biopsy    CT biopsy of L iliac bone lesion      12/26/2015 Pathology Results    Bone, biopsy, left iliac - POSITIVE FOR METASTATIC ADENOCARCINOMA.      12/28/2015 - 09/01/2016 Chemotherapy    Firmagon instituted 240 mg       01/01/2016 Procedure    Port placed by IR.      01/07/2016 - 04/11/2016 Chemotherapy    Docetaxel every 21 days x 6 cycles with dose reductions due to tolerance      02/18/2016 Adverse Reaction    GI toxicity from docetaxel, dose reduced to 60 mg/m2      02/18/2016 Treatment Plan Change    Docetaxel dose reduced by 20%      03/06/2016 Procedure    Dr. Enrique Sack- Multiple extraction of tooth numbers 3, 5, 6, 8, 9, 10, 11, 12, 20, 21, 22, 27, 28, and 31. 4 Quadrants of alveoloplasty. Bilateral mandibular lingual tori reductions.      04/09/2016 Imaging    Brain MRI 1. No intracranial metastatic disease 2. No acute intracranial abnormality 3.  Findings of chronic microvascular disease      05/14/2016 Imaging    CT C/A/P Stable appearing diffuse bony metastatic disease. No new significant disease is seen Innumerable patchy sclerotic osseous metastases throughout the axial and proximal appendicular skeleton, stable in size and distribution, significantly increased in density in the interval, likely reflecting treatment effect. 2. No new or progressive metastatic disease in the chest, abdomen or pelvis. 3. Bilateral perifissural pulmonary nodules are stable and probably benign. 4. Aortic atherosclerosis. Infrarenal 4.3 cm abdominal aortic aneurysm, minimally increased in size. Recommend followup by ultrasound in 1 year.      06/02/2016 Tumor Marker    PSA 0.99 ng/ml       - 07/31/2016 Radiation Therapy    Palliative XRT by Dr. Lianne Cure in [place].       Chemotherapy    Depo-[name] every 3 months, beginning in Feb 2018         CANCER STAGING: Cancer Staging Prostate carcinoma [facility]) Staging form: Prostate, AJCC 7th Edition - Clinical stage from 12/24/2015: Stage IV (TX, N0, M1b, PSA: 20 or greater) - Signed by Baird Cancer, PA-C on 01/07/2016    INTERVAL HISTORY:  Matthew [name] 62 y.o. male returns for routine follow-up of metastatic prostate cancer to the bones.  He started taking Abiraterone thousand milligrams daily on 12/02/2017.  He is also  taking prednisone 5 mg twice daily.  His blood pressure is in the normal range.  He has noticed that his bone pains particularly in the lower back and spine region have gotten worse since the start of Abiraterone.  He has taken a lot of hydrocodone tablets sometimes up to 6 to 8 pills/day.  On average she is taking them 2 to 3/day.  He requests refill for both medications.  He denies any worsening of his baseline nausea or vomiting.  Typically nausea vomiting happens once a month.  He denies any fluid retention.  Appetite has been good.  REVIEW OF SYSTEMS:  Review of Systems    Constitutional: Positive for fatigue.  Gastrointestinal: Positive for nausea and vomiting.  Musculoskeletal: Positive for back pain.  All other systems reviewed and are negative.    PAST MEDICAL/SURGICAL HISTORY:  Past Medical History:  Diagnosis Date  . Arthritis    Left knee  . Bone cancer ([place])   . Bone metastases ([place]) 12/26/2015  . Cancer [facility])    Prostate  . DVT (deep venous thrombosis) ([place]) 2004  . Kidney stones    ~2002  . Prostate carcinoma ([place]) 12/26/2015   Past Surgical History:  Procedure Laterality Date  . CIRCUMCISION  30 years ago  . MULTIPLE EXTRACTIONS WITH ALVEOLOPLASTY N/A 03/06/2016   Procedure: Extraction of tooth #'s 3,5,6,8-12,20-22, 27, 28, and 31 with alveoloplasty and bilateral mandibular tori reductions;  Surgeon: Lenn Cal, DDS;  Location: [facility];  Service: Oral Surgery;  Laterality: N/A;     SOCIAL HISTORY:  Social History   Socioeconomic History  . Marital status: Single    Spouse name: Not on file  . Number of children: 2  . Years of education: Not on file  . Highest education level: Not on file  Occupational History  . Occupation: Research scientist (life sciences)  Social Needs  . Financial resource strain: Not on file  . Food insecurity:    Worry: Not on file    Inability: Not on file  . Transportation needs:    Medical: Not on file    Non-medical: Not on file  Tobacco Use  . Smoking status: Current Every Day Smoker    Packs/day: 1.00    Years: 50.00    Pack years: 50.00    Types: Cigarettes    Start date: 06/16/1965  . Smokeless tobacco: Former Systems developer    Types: [place] date: 08/04/2005  Substance and Sexual Activity  . Alcohol use: No    Alcohol/week: 0.0 oz  . Drug use: Yes    Types: Marijuana    Comment: THC for appetite  . Sexual activity: Not on file  Lifestyle  . Physical activity:    Days per week: Not on file    Minutes per session: Not on file  . Stress: Not on file  Relationships  . Social connections:     Talks on phone: Not on file    Gets together: Not on file    Attends religious service: Not on file    Active member of club or organization: Not on file    Attends meetings of clubs or organizations: Not on file    Relationship status: Not on file  . Intimate partner violence:    Fear of current or ex partner: Not on file    Emotionally abused: Not on file    Physically abused: Not on file    Forced sexual activity: Not on file  Other Topics Concern  .  Not on file  Social History Narrative  . Not on file    FAMILY HISTORY:  Family History  Problem Relation Age of Onset  . Cancer Mother     CURRENT MEDICATIONS:  Outpatient Encounter Medications as of 01/01/2018  Medication Sig  . abiraterone acetate (ZYTIGA) 250 MG tablet Take 4 tablets (1,000 mg total) by mouth daily. Take on an empty stomach 1 hour before or 2 hours after a meal  . aspirin EC 81 MG tablet Take 81 mg by mouth daily.  . Calcium Carb-Cholecalciferol (CALCIUM 500 + D3) 500-600 MG-UNIT TABS Take 2 tablets by mouth daily.  . ergocalciferol (VITAMIN D2) 50000 units capsule Take 1 capsule (50,000 Units total) by mouth every Friday.  . fentaNYL (DURAGESIC - DOSED MCG/HR) 75 MCG/HR Place 1 patch (75 mcg total) onto the skin every 3 (three) days.  [place] HYDROcodone-acetaminophen (NORCO) 10-325 MG tablet Take 1 tablet by mouth every 4 (four) hours as needed.  . lactulose (CEPHULAC) 10 g packet Take 1 packet (10 g total) by mouth 3 (three) times daily as needed (if no bowel movement in 48hrs).  . lidocaine-prilocaine (EMLA) cream Apply a quarter size amount to port site 1 hour prior to chemo. Do not rub in. Cover with plastic wrap.  . omeprazole (PRILOSEC) 40 MG capsule TAKE ONE CAPSULE BY MOUTH TWICE DAILY  . prochlorperazine (COMPAZINE) 10 MG tablet Take 1 tablet (10 mg total) by mouth every 6 (six) hours as needed for nausea or vomiting.  . rosuvastatin (CRESTOR) 20 MG tablet TAKE ONE TABLET BY MOUTH ONCE DAILY  . temazepam  (RESTORIL) 15 MG capsule Take 1 capsule (15 mg total) by mouth at bedtime as needed for sleep.  . [DISCONTINUED] HYDROcodone-acetaminophen (NORCO) 10-325 MG tablet Take 1 tablet by mouth every 4 (four) hours as needed.  . fentaNYL (DURAGESIC - DOSED MCG/HR) 100 MCG/HR Place 1 patch (100 mcg total) onto the skin every 3 (three) days.   No facility-administered encounter medications on file as of 01/01/2018.     ALLERGIES:  Allergies  Allergen Reactions  . Meloxicam Other (See Comments)    Caused stomach bleeding.     PHYSICAL EXAM:  ECOG Performance status: 1  Vitals:   01/01/18 1540  BP: 113/75  Pulse: 74  Resp: 18  Temp: 98 F (36.7 C)  SpO2: 99%   Filed Weights   01/01/18 1540  Weight: 236 lb 1.6 oz (107.1 kg)    Physical Exam Deferred  LABORATORY DATA:  I have reviewed the labs as listed.  CBC    Component Value Date/Time   WBC 4.3 01/01/2018 1429   RBC 4.35 01/01/2018 1429   HGB 12.9 (L) 01/01/2018 1429   HCT 39.8 01/01/2018 1429   PLT 155 01/01/2018 1429   MCV 91.5 01/01/2018 1429   MCH 29.7 01/01/2018 1429   MCHC 32.4 01/01/2018 1429   RDW 13.7 01/01/2018 1429   LYMPHSABS 1.0 11/30/2017 1457   MONOABS 0.3 11/30/2017 1457   EOSABS 0.2 11/30/2017 1457   BASOSABS 0.0 11/30/2017 1457   CMP Latest Ref Rng & Units 01/01/2018 11/30/2017 11/03/2017  Glucose 65 - 99 mg/dL 114(H) 117(H) 106(H)  BUN 6 - 20 mg/dL 12 13 15   Creatinine 0.61 - 1.24 mg/dL 1.13 0.98 1.13  Sodium 135 - 145 mmol/L 139 138 138  Potassium 3.5 - 5.1 mmol/L 3.8 4.4 4.2  Chloride 101 - 111 mmol/L 107 107 102  CO2 22 - 32 mmol/L 24 22 22   Calcium 8.9 -  10.3 mg/dL 8.8(L) 9.1 9.1  Total Protein 6.5 - 8.1 g/dL 6.6 6.9 7.4  Total Bilirubin 0.3 - 1.2 mg/dL 0.5 0.4 0.5  Alkaline Phos 38 - 126 U/L 71 60 65  AST 15 - 41 U/L 19 15 16   ALT 17 - 63 U/L 14(L) 14(L) 15(L)       DIAGNOSTIC IMAGING:  I have reviewed images from CT scan dated 11/17/2017 and bone scan dated  11/13/2017.     ASSESSMENT & PLAN:   Prostate carcinoma ([place]) 1.  Metastatic prostate cancer to the bones: - Status post chemotherapy with docetaxel from 01/07/2016 through 04/11/2016, likely in the castration sensitive setting. -Recent rising PSA levels, patient maintained on [name], last injection 22.5 mg on 10/02/2017 -Most recent PSA of 7, CT scan (11/17/2017) and bone scan (11/13/2017) showing slight worsening of bone metastasis, no evidence of visceral metastasis - Abiraterone thousand milligrams daily with prednisone 5 mg twice a day started on 12/02/2017.  He is tolerating it so far very well.  Blood counts are okay today.  He will come back in 4 weeks with repeat blood counts.  We will check PSA at that time.  2.  Prostate cancer related bone pain: He is currently on fentanyl 75 mcg patch.  Since he started Abiraterone, he started noticing more pains in the lower back and spine region.  He had taken hydrocodone 2-3 on an average per day.  Some days he had to take 6 to 8 tablets.  I would increase the fentanyl to 100 mcg patch.  I have asked him to discontinue it immediately if he develops any breathing problems or excessive drowsiness.  I have sent in prescription for both medicines.  3.  Bone metastasis: He will continue monthly denosumab injections.  4.  Difficulty sleeping: He will continue temazepam 15 to 30 mg at bedtime.      Orders placed this encounter:  Orders Placed This Encounter  Procedures  . CBC with Differential ([place] Only)  . CMP ([place] only)  . PSA      Derek Jack, MD [place] [phone]

## 2018-01-01 NOTE — Patient Instructions (Signed)
[facility] at [facility] Discharge Instructions  Today you saw Dr. K.   Thank you for choosing [facility] at [facility] to provide your oncology and hematology care.  To afford each patient quality time with our provider, please arrive at least 15 minutes before your scheduled appointment time.   If you have a lab appointment with the Cancer Center please come in thru the  Main Entrance and check in at the main information desk  You need to re-schedule your appointment should you arrive 10 or more minutes late.  We strive to give you quality time with our providers, and arriving late affects you and other patients whose appointments are after yours.  Also, if you no show three or more times for appointments you may be dismissed from the clinic at the providers discretion.     Again, thank you for choosing [facility].  Our hope is that these requests will decrease the amount of time that you wait before being seen by our physicians.       _____________________________________________________________  Should you have questions after your visit to [facility], please contact our office at [phone] between the hours of 8:30 a.m. and 4:30 p.m.  Voicemails left after 4:30 p.m. will not be returned until the following business day.  For prescription refill requests, have your pharmacy contact our office.       Resources For Cancer Patients and their Caregivers ? American Cancer Society: Can assist with transportation, wigs, general needs, runs Look Good Feel Better.        1-888-227-6333 ? Cancer Care: Provides financial assistance, online support groups, medication/co-pay assistance.  1-800-813-HOPE (4673) ? [facility] Assists [place] cancer patients and their families through emotional , educational and financial support.  336-427-4357 ? [place] DSS Where to apply for food  stamps, Medicaid and utility assistance. 336-342-1394 ? [facility]: Transportation to medical appointments. 336-347-2287 ? Social Security Administration: May apply for disability if have a Stage IV cancer. 336-342-7796 1-800-772-1213 ? [place] Aging, Disability and Transit Services: Assists with nutrition, care and transit needs. 336-349-2343  Cancer Center Support Programs:   > Cancer Support Group  2nd Tuesday of the month 1pm-2pm, Journey Room   > Creative Journey  3rd Tuesday of the month 1130am-1pm, Journey Room    

## 2018-01-01 NOTE — Progress Notes (Signed)
[name] Agcaoili tolerated Xgeva and [name] injections well without complaints or incident. Labs,including Calcium 8.8, reviewed with and pt seen by Dr. Delton Coombes today and pt approved for both injections per MD. Pt denied any tooth or jaw pain and no recent or future dental visits prior to administering the Xgeva injection. VSS Pt discharged self ambulatory in satisfactory condition accompanied by family member

## 2018-01-01 NOTE — Assessment & Plan Note (Addendum)
1.  Metastatic prostate cancer to the bones: - Status post chemotherapy with docetaxel from 01/07/2016 through 04/11/2016, likely in the castration sensitive setting. -Recent rising PSA levels, patient maintained on [name], last injection 22.5 mg on 10/02/2017 -Most recent PSA of 7, CT scan (11/17/2017) and bone scan (11/13/2017) showing slight worsening of bone metastasis, no evidence of visceral metastasis - Abiraterone thousand milligrams daily with prednisone 5 mg twice a day started on 12/02/2017.  He is tolerating it so far very well.  Blood counts are okay today.  He will come back in 4 weeks with repeat blood counts.  We will check PSA at that time.  2.  Prostate cancer related bone pain: He is currently on fentanyl 75 mcg patch.  Since he started Abiraterone, he started noticing more pains in the lower back and spine region.  He had taken hydrocodone 2-3 on an average per day.  Some days he had to take 6 to 8 tablets.  I would increase the fentanyl to 100 mcg patch.  I have asked him to discontinue it immediately if he develops any breathing problems or excessive drowsiness.  I have sent in prescription for both medicines.  3.  Bone metastasis: He will continue monthly denosumab injections.  4.  Difficulty sleeping: He will continue temazepam 15 to 30 mg at bedtime.

## 2018-01-18 ENCOUNTER — Other Ambulatory Visit (HOSPITAL_COMMUNITY): Payer: Self-pay

## 2018-01-18 DIAGNOSIS — C61 Malignant neoplasm of prostate: Secondary | ICD-10-CM

## 2018-01-18 MED ORDER — ABIRATERONE ACETATE 250 MG PO TABS: 1000.0000 mg | ORAL_TABLET | Freq: Every day | ORAL | 0 refills | Status: DC

## 2018-01-18 NOTE — Telephone Encounter (Signed)
Received refill request from patients pharmacy for [place]. Reviewed with provider, chart checked and refilled.

## 2018-01-19 ENCOUNTER — Ambulatory Visit: Payer: Self-pay | Admitting: Gastroenterology

## 2018-01-28 ENCOUNTER — Other Ambulatory Visit (HOSPITAL_COMMUNITY): Payer: Self-pay | Admitting: *Deleted

## 2018-01-29 ENCOUNTER — Inpatient Hospital Stay (HOSPITAL_COMMUNITY): Payer: BLUE CROSS/BLUE SHIELD

## 2018-01-29 ENCOUNTER — Inpatient Hospital Stay (HOSPITAL_COMMUNITY): Payer: BLUE CROSS/BLUE SHIELD | Attending: Hematology | Admitting: Hematology

## 2018-01-29 ENCOUNTER — Ambulatory Visit (HOSPITAL_COMMUNITY): Payer: Self-pay

## 2018-01-29 ENCOUNTER — Inpatient Hospital Stay (HOSPITAL_COMMUNITY): Payer: BLUE CROSS/BLUE SHIELD | Attending: Hematology and Oncology

## 2018-01-29 ENCOUNTER — Other Ambulatory Visit (HOSPITAL_COMMUNITY): Payer: Self-pay

## 2018-01-29 ENCOUNTER — Other Ambulatory Visit: Payer: Self-pay

## 2018-01-29 ENCOUNTER — Encounter (HOSPITAL_COMMUNITY): Payer: Self-pay | Admitting: Hematology

## 2018-01-29 VITALS — BP 118/77 | HR 83 | Temp 98.2°F | Resp 18 | Wt 233.5 lb

## 2018-01-29 DIAGNOSIS — C61 Malignant neoplasm of prostate: Secondary | ICD-10-CM

## 2018-01-29 DIAGNOSIS — Z923 Personal history of irradiation: Secondary | ICD-10-CM | POA: Diagnosis not present

## 2018-01-29 DIAGNOSIS — Z9221 Personal history of antineoplastic chemotherapy: Secondary | ICD-10-CM | POA: Insufficient documentation

## 2018-01-29 DIAGNOSIS — G479 Sleep disorder, unspecified: Secondary | ICD-10-CM | POA: Insufficient documentation

## 2018-01-29 DIAGNOSIS — G893 Neoplasm related pain (acute) (chronic): Secondary | ICD-10-CM

## 2018-01-29 DIAGNOSIS — Z7952 Long term (current) use of systemic steroids: Secondary | ICD-10-CM | POA: Insufficient documentation

## 2018-01-29 DIAGNOSIS — C7951 Secondary malignant neoplasm of bone: Secondary | ICD-10-CM

## 2018-01-29 DIAGNOSIS — Z79899 Other long term (current) drug therapy: Secondary | ICD-10-CM | POA: Insufficient documentation

## 2018-01-29 DIAGNOSIS — K219 Gastro-esophageal reflux disease without esophagitis: Secondary | ICD-10-CM

## 2018-01-29 LAB — COMPREHENSIVE METABOLIC PANEL
ALK PHOS: 72 U/L (ref 38–126)
ALT: 11 U/L (ref 0–44)
ANION GAP: 10 (ref 5–15)
AST: 15 U/L (ref 15–41)
Albumin: 3.6 g/dL (ref 3.5–5.0)
BUN: 8 mg/dL (ref 8–23)
CALCIUM: 8.5 mg/dL — AB (ref 8.9–10.3)
CO2: 23 mmol/L (ref 22–32)
Chloride: 108 mmol/L (ref 98–111)
Creatinine, Ser: 0.88 mg/dL (ref 0.61–1.24)
GFR calc non Af Amer: >60 mL/min (ref >60)
Glucose, Bld: 116 mg/dL — ABNORMAL HIGH (ref 70–99)
POTASSIUM: 3.6 mmol/L (ref 3.5–5.1)
SODIUM: 141 mmol/L (ref 135–145)
Total Bilirubin: 0.7 mg/dL (ref 0.3–1.2)
Total Protein: 6.7 g/dL (ref 6.5–8.1)

## 2018-01-29 LAB — CBC
HCT: 41.2 % (ref 39.0–52.0)
Hemoglobin: 13.8 g/dL (ref 13.0–17.0)
MCH: 30.7 pg (ref 26.0–34.0)
MCHC: 33.5 g/dL (ref 30.0–36.0)
MCV: 91.6 fL (ref 78.0–100.0)
PLATELETS: 151 10*3/uL (ref 150–400)
RBC: 4.5 MIL/uL (ref 4.22–5.81)
RDW: 13.9 % (ref 11.5–15.5)
WBC: 4 10*3/uL (ref 4.0–10.5)

## 2018-01-29 LAB — DIFFERENTIAL
BASOS ABS: 0 10*3/uL (ref 0.0–0.1)
BASOS PCT: 0 %
Eosinophils Absolute: 0.1 10*3/uL (ref 0.0–0.7)
Eosinophils Relative: 3 %
LYMPHS PCT: 19 %
Lymphs Abs: 0.8 10*3/uL (ref 0.7–4.0)
Monocytes Absolute: 0.3 10*3/uL (ref 0.1–1.0)
Monocytes Relative: 7 %
NEUTROS ABS: 2.9 10*3/uL (ref 1.7–7.7)
Neutrophils Relative %: 71 %

## 2018-01-29 LAB — PSA: Prostatic Specific Antigen: 1.95 ng/mL (ref 0.00–4.00)

## 2018-01-29 MED ORDER — PREDNISONE 5 MG PO TABS: 5.0000 mg | ORAL_TABLET | Freq: Two times a day (BID) | ORAL | 3 refills | Status: DC

## 2018-01-29 MED ORDER — HYDROCODONE-ACETAMINOPHEN 10-325 MG PO TABS: 1.0000 | ORAL_TABLET | ORAL | 0 refills | Status: DC | PRN

## 2018-01-29 MED ORDER — ABIRATERONE ACETATE 250 MG PO TABS: 750.0000 mg | ORAL_TABLET | Freq: Every day | ORAL | 0 refills | Status: AC

## 2018-01-29 MED ORDER — DENOSUMAB 120 MG/1.7ML ~~LOC~~ SOLN
120.0000 mg | Freq: Once | SUBCUTANEOUS | Status: AC
  Administered 2018-01-29: 120 mg via SUBCUTANEOUS
  Filled 2018-01-29: qty 1.7

## 2018-01-29 MED ORDER — FENTANYL 75 MCG/HR TD PT72: 75.0000 ug | MEDICATED_PATCH | TRANSDERMAL | 0 refills | Status: DC

## 2018-01-29 MED ORDER — OMEPRAZOLE 20 MG PO CPDR: 20.0000 mg | DELAYED_RELEASE_CAPSULE | Freq: Every day | ORAL | 3 refills | Status: DC

## 2018-01-29 NOTE — Assessment & Plan Note (Signed)
1.  Metastatic prostate cancer to the bones: - Status post chemotherapy with docetaxel from 01/07/2016 through 04/11/2016, likely in the castration sensitive setting. -Recent rising PSA levels, patient maintained on [name], last injection 22.5 mg on 10/02/2017 -Most recent PSA of 7, CT scan (11/17/2017) and bone scan (11/13/2017) showing slight worsening of bone metastasis, no evidence of visceral metastasis - Abiraterone 1000mg  daily with prednisone 5 mg twice a day started on 12/02/2017.  He complained of worsening of pains in his lower back and spine region since the start of Abiraterone.  I will cut back on the dose of Abiraterone to 750 mg daily.  He will continue prednisone at the same dose.  His last PSA on 01/01/2018 has come down to 4.39 from 17.63 prior to start of therapy.  PSA level from today is pending.  2.  Prostate cancer related bone pain: As his pain has increased since the start of Abiraterone, I have increased the fentanyl to 100 mcg patch.  Patient reports having palpitations and lightheadedness lasting about 4 hours each time they puts on the patch.  Hence I would cut back on the fentanyl to 75 mcg patch.  He will take hydrocodone 1 tablet every 4 hours as needed for breakthrough.  I have given prescriptions for both.  3.  Bone metastasis: He will continue monthly denosumab injections.  4.  Difficulty sleeping: He will continue temazepam 15 to 30 mg at bedtime.

## 2018-01-29 NOTE — Progress Notes (Signed)
Pt given Xgeva in left upper abdomen. Pt tolerated well with no complaints. Pt stable and discharged home ambulatory.

## 2018-01-29 NOTE — Progress Notes (Signed)
[facility], [place] [address]   CLINIC:  Medical Oncology/Hematology  PCP:  [name], [place] [address] [phone]   REASON FOR VISIT:  Follow-up for metastatic prostate cancer to the bones.  CURRENT THERAPY: Zytiga and prednisone.  BRIEF ONCOLOGIC HISTORY:    Prostate carcinoma ([place])   12/10/2015 Imaging    CTA chest, multiple thoracic and rib osseous lesions. no primary evident. Atherosclerosis including aortic and CAD      12/12/2015 Imaging    CT abdomen/pelvis with sclerotic osseous lesions througout the lumbar spine, sacrum, bilateral iliac bones and L proximal femur worrisome for sclerotic osseous mets,mild prostatomegaly, no LAD, Infrarenal 3.8 cm AAA      12/12/2015 Tumor Marker    PSA 153.85      12/18/2015 Imaging    Bone Scan Diffuse bony metastatic disease, posterior calvarium, sternum, thoracic, lumbar spine, bilateral ribs, L shoulder, bilateral bony pelvis, proximal L femur      12/24/2015 Initial Biopsy    CT biopsy of L iliac bone lesion      12/26/2015 Pathology Results    Bone, biopsy, left iliac - POSITIVE FOR METASTATIC ADENOCARCINOMA.      12/28/2015 - 09/01/2016 Chemotherapy    Firmagon instituted 240 mg       01/01/2016 Procedure    Port placed by IR.      01/07/2016 - 04/11/2016 Chemotherapy    Docetaxel every 21 days x 6 cycles with dose reductions due to tolerance      02/18/2016 Adverse Reaction    GI toxicity from docetaxel, dose reduced to 60 mg/m2      02/18/2016 Treatment Plan Change    Docetaxel dose reduced by 20%      03/06/2016 Procedure    Dr. Enrique Sack- Multiple extraction of tooth numbers 3, 5, 6, 8, 9, 10, 11, 12, 20, 21, 22, 27, 28, and 31. 4 Quadrants of alveoloplasty. Bilateral mandibular lingual tori reductions.      04/09/2016 Imaging    Brain MRI 1. No intracranial metastatic disease 2. No acute intracranial abnormality 3. Findings of chronic  microvascular disease      05/14/2016 Imaging    CT C/A/P Stable appearing diffuse bony metastatic disease. No new significant disease is seen Innumerable patchy sclerotic osseous metastases throughout the axial and proximal appendicular skeleton, stable in size and distribution, significantly increased in density in the interval, likely reflecting treatment effect. 2. No new or progressive metastatic disease in the chest, abdomen or pelvis. 3. Bilateral perifissural pulmonary nodules are stable and probably benign. 4. Aortic atherosclerosis. Infrarenal 4.3 cm abdominal aortic aneurysm, minimally increased in size. Recommend followup by ultrasound in 1 year.      06/02/2016 Tumor Marker    PSA 0.99 ng/ml       - 07/31/2016 Radiation Therapy    Palliative XRT by Dr. Lianne Cure in [place].       Chemotherapy    Depo-[name] every 3 months, beginning in Feb 2018         CANCER STAGING: Cancer Staging Prostate carcinoma [facility]) Staging form: Prostate, AJCC 7th Edition - Clinical stage from 12/24/2015: Stage IV (TX, N0, M1b, PSA: 20 or greater) - Signed by Baird Cancer, PA-C on 01/07/2016    INTERVAL HISTORY:  Matthew [name] 62 y.o. male returns for follow-up of metastatic prostate cancer to the bones.  He is tolerating Zytiga thousand milligrams daily reasonably well.  However he has noticed more pains in  his lower back.  I increased his fentanyl to 100 mcg patch.  However he has not tolerated it well.  He requests to cut it back to 75 mcg.  He is drinking Ensure daily.  Weight is stable.  No major hot flashes was reported.  No infections or hospitalizations.    REVIEW OF SYSTEMS:  Review of Systems  Constitutional: Positive for fatigue.  HENT:   Positive for trouble swallowing.   Gastrointestinal: Positive for nausea.  Neurological: Positive for dizziness and numbness.  Hematological: Bruises/bleeds easily.  All other systems reviewed and are negative.    PAST MEDICAL/SURGICAL  HISTORY:  Past Medical History:  Diagnosis Date  . Arthritis    Left knee  . Bone cancer ([place])   . Bone metastases ([place]) 12/26/2015  . Cancer [facility])    Prostate  . DVT (deep venous thrombosis) ([place]) 2004  . Kidney stones    ~2002  . Prostate carcinoma ([place]) 12/26/2015   Past Surgical History:  Procedure Laterality Date  . CIRCUMCISION  30 years ago  . MULTIPLE EXTRACTIONS WITH ALVEOLOPLASTY N/A 03/06/2016   Procedure: Extraction of tooth #'s 3,5,6,8-12,20-22, 27, 28, and 31 with alveoloplasty and bilateral mandibular tori reductions;  Surgeon: Lenn Cal, DDS;  Location: [facility];  Service: Oral Surgery;  Laterality: N/A;     SOCIAL HISTORY:  Social History   Socioeconomic History  . Marital status: Single    Spouse name: Not on file  . Number of children: 2  . Years of education: Not on file  . Highest education level: Not on file  Occupational History  . Occupation: Research scientist (life sciences)  Social Needs  . Financial resource strain: Not on file  . Food insecurity:    Worry: Not on file    Inability: Not on file  . Transportation needs:    Medical: Not on file    Non-medical: Not on file  Tobacco Use  . Smoking status: Current Every Day Smoker    Packs/day: 1.00    Years: 50.00    Pack years: 50.00    Types: Cigarettes    Start date: 06/16/1965  . Smokeless tobacco: Former Systems developer    Types: [place] date: 08/04/2005  Substance and Sexual Activity  . Alcohol use: No    Alcohol/week: 0.0 oz  . Drug use: Yes    Types: Marijuana    Comment: THC for appetite  . Sexual activity: Not on file  Lifestyle  . Physical activity:    Days per week: Not on file    Minutes per session: Not on file  . Stress: Not on file  Relationships  . Social connections:    Talks on phone: Not on file    Gets together: Not on file    Attends religious service: Not on file    Active member of club or organization: Not on file    Attends meetings of clubs or organizations: Not on  file    Relationship status: Not on file  . Intimate partner violence:    Fear of current or ex partner: Not on file    Emotionally abused: Not on file    Physically abused: Not on file    Forced sexual activity: Not on file  Other Topics Concern  . Not on file  Social History Narrative  . Not on file    FAMILY HISTORY:  Family History  Problem Relation Age of Onset  . Cancer Mother     CURRENT MEDICATIONS:  Outpatient Encounter Medications as of 01/29/2018  Medication Sig  . abiraterone acetate (ZYTIGA) 250 MG tablet Take 3 tablets (750 mg total) by mouth daily. Take on an empty stomach 1 hour before or 2 hours after a meal  . aspirin EC 81 MG tablet Take 81 mg by mouth daily.  . Calcium Carb-Cholecalciferol (CALCIUM 500 + D3) 500-600 MG-UNIT TABS Take 2 tablets by mouth daily.  . ergocalciferol (VITAMIN D2) 50000 units capsule Take 1 capsule (50,000 Units total) by mouth every Friday.  . fentaNYL (DURAGESIC - DOSED MCG/HR) 75 MCG/HR Place 1 patch (75 mcg total) onto the skin every 3 (three) days.  Marland Kitchen HYDROcodone-acetaminophen (NORCO) 10-325 MG tablet Take 1 tablet by mouth every 4 (four) hours as needed.  . lactulose (CEPHULAC) 10 g packet Take 1 packet (10 g total) by mouth 3 (three) times daily as needed (if no bowel movement in 48hrs).  . lidocaine-prilocaine (EMLA) cream Apply a quarter size amount to port site 1 hour prior to chemo. Do not rub in. Cover with plastic wrap.  . omeprazole (PRILOSEC) 20 MG capsule Take 1 capsule (20 mg total) by mouth daily.  . prochlorperazine (COMPAZINE) 10 MG tablet Take 1 tablet (10 mg total) by mouth every 6 (six) hours as needed for nausea or vomiting.  . rosuvastatin (CRESTOR) 20 MG tablet TAKE ONE TABLET BY MOUTH ONCE DAILY  . temazepam (RESTORIL) 15 MG capsule Take 1 capsule (15 mg total) by mouth at bedtime as needed for sleep.  . [DISCONTINUED] abiraterone acetate (ZYTIGA) 250 MG tablet Take 4 tablets (1,000 mg total) by mouth daily.  Take on an empty stomach 1 hour before or 2 hours after a meal  . [DISCONTINUED] fentaNYL (DURAGESIC - DOSED MCG/HR) 100 MCG/HR Place 1 patch (100 mcg total) onto the skin every 3 (three) days.  . [DISCONTINUED] HYDROcodone-acetaminophen (NORCO) 10-325 MG tablet Take 1 tablet by mouth every 4 (four) hours as needed.  . [DISCONTINUED] omeprazole (PRILOSEC) 40 MG capsule TAKE ONE CAPSULE BY MOUTH TWICE DAILY  . predniSONE (DELTASONE) 5 MG tablet Take 1 tablet (5 mg total) by mouth 2 (two) times daily with a meal.  . [DISCONTINUED] fentaNYL (DURAGESIC - DOSED MCG/HR) 75 MCG/HR Place 1 patch (75 mcg total) onto the skin every 3 (three) days.   No facility-administered encounter medications on file as of 01/29/2018.     ALLERGIES:  Allergies  Allergen Reactions  . Meloxicam Other (See Comments)    Caused stomach bleeding.     PHYSICAL EXAM:  ECOG Performance status: 1  I have reviewed his vitals.  Blood pressure is 118/77.  Pulse rate is 83, respiratory rate is 18, temperature is 98.2. Physical Exam   LABORATORY DATA:  I have reviewed the labs as listed.  CBC    Component Value Date/Time   WBC 4.0 01/29/2018 1344   RBC 4.50 01/29/2018 1344   HGB 13.8 01/29/2018 1344   HCT 41.2 01/29/2018 1344   PLT 151 01/29/2018 1344   MCV 91.6 01/29/2018 1344   MCH 30.7 01/29/2018 1344   MCHC 33.5 01/29/2018 1344   RDW 13.9 01/29/2018 1344   LYMPHSABS 0.8 01/29/2018 1344   MONOABS 0.3 01/29/2018 1344   EOSABS 0.1 01/29/2018 1344   BASOSABS 0.0 01/29/2018 1344   CMP Latest Ref Rng & Units 01/29/2018 01/01/2018 11/30/2017  Glucose 70 - 99 mg/dL 116(H) 114(H) 117(H)  BUN 8 - 23 mg/dL 8 12 13   Creatinine 0.61 - 1.24 mg/dL 0.88 1.13 0.98  Sodium 135 -  145 mmol/L 141 139 138  Potassium 3.5 - 5.1 mmol/L 3.6 3.8 4.4  Chloride 98 - 111 mmol/L 108 107 107  CO2 22 - 32 mmol/L 23 24 22   Calcium 8.9 - 10.3 mg/dL 8.5(L) 8.8(L) 9.1  Total Protein 6.5 - 8.1 g/dL 6.7 6.6 6.9  Total Bilirubin 0.3 - 1.2  mg/dL 0.7 0.5 0.4  Alkaline Phos 38 - 126 U/L 72 71 60  AST 15 - 41 U/L 15 19 15   ALT 0 - 44 U/L 11 14(L) 14(L)        ASSESSMENT & PLAN:   Prostate carcinoma ([facility]) 1.  Metastatic prostate cancer to the bones: - Status post chemotherapy with docetaxel from 01/07/2016 through 04/11/2016, likely in the castration sensitive setting. -Recent rising PSA levels, patient maintained on [name], last injection 22.5 mg on 10/02/2017 -Most recent PSA of 7, CT scan (11/17/2017) and bone scan (11/13/2017) showing slight worsening of bone metastasis, no evidence of visceral metastasis - Abiraterone 1000mg  daily with prednisone 5 mg twice a day started on 12/02/2017.  He complained of worsening of pains in his lower back and spine region since the start of Abiraterone.  I will cut back on the dose of Abiraterone to 750 mg daily.  He will continue prednisone at the same dose.  His last PSA on 01/01/2018 has come down to 4.39 from 17.63 prior to start of therapy.  PSA level from today is pending.  2.  Prostate cancer related bone pain: As his pain has increased since the start of Abiraterone, I have increased the fentanyl to 100 mcg patch.  Patient reports having palpitations and lightheadedness lasting about 4 hours each time they puts on the patch.  Hence I would cut back on the fentanyl to 75 mcg patch.  He will take hydrocodone 1 tablet every 4 hours as needed for breakthrough.  I have given prescriptions for both.  3.  Bone metastasis: He will continue monthly denosumab injections.  4.  Difficulty sleeping: He will continue temazepam 15 to 30 mg at bedtime.      Orders placed this encounter:  Orders Placed This Encounter  Procedures  . CBC with Differential/Platelet  . Comprehensive metabolic panel  . PSA      Derek Jack, MD [place] [phone]

## 2018-02-15 ENCOUNTER — Telehealth: Payer: Self-pay | Admitting: *Deleted

## 2018-02-15 ENCOUNTER — Encounter: Payer: Self-pay | Admitting: *Deleted

## 2018-02-15 ENCOUNTER — Ambulatory Visit (INDEPENDENT_AMBULATORY_CARE_PROVIDER_SITE_OTHER): Payer: BLUE CROSS/BLUE SHIELD | Admitting: Gastroenterology

## 2018-02-15 ENCOUNTER — Encounter: Payer: Self-pay | Admitting: Gastroenterology

## 2018-02-15 ENCOUNTER — Other Ambulatory Visit: Payer: Self-pay | Admitting: *Deleted

## 2018-02-15 DIAGNOSIS — R1319 Other dysphagia: Secondary | ICD-10-CM

## 2018-02-15 DIAGNOSIS — R630 Anorexia: Secondary | ICD-10-CM

## 2018-02-15 DIAGNOSIS — R1013 Epigastric pain: Secondary | ICD-10-CM

## 2018-02-15 DIAGNOSIS — K5900 Constipation, unspecified: Secondary | ICD-10-CM | POA: Insufficient documentation

## 2018-02-15 DIAGNOSIS — R634 Abnormal weight loss: Secondary | ICD-10-CM | POA: Insufficient documentation

## 2018-02-15 DIAGNOSIS — K5903 Drug induced constipation: Secondary | ICD-10-CM

## 2018-02-15 DIAGNOSIS — R1310 Dysphagia, unspecified: Secondary | ICD-10-CM

## 2018-02-15 MED ORDER — PEG 3350-KCL-NA BICARB-NACL 420 G PO SOLR: 4000.0000 mL | Freq: Once | ORAL | 0 refills | Status: AC

## 2018-02-15 MED ORDER — LUBIPROSTONE 24 MCG PO CAPS: 24.0000 ug | ORAL_CAPSULE | Freq: Two times a day (BID) | ORAL | 3 refills | Status: DC

## 2018-02-15 NOTE — Telephone Encounter (Signed)
Pre-op scheduled for 04/12/18 at 10:00am. Letter mailed. LMOVM

## 2018-02-15 NOTE — Progress Notes (Signed)
Primary Care Physician:  [name], MD Referring physician: Dr. Derek Jack Primary Gastroenterologist:  Garfield Cornea, MD   Chief Complaint  Patient presents with  . Constipation  . Colonoscopy    ?, never had tcs    HPI:  Matthew [name] is a 62 y.o. male here the request of Dr. Walden Field for further management of constipation and because the patient has never had a colonoscopy. Dr. Walden Field is no longer at [facility], care taken over by Dr. Delton Coombes.  Patient has history of stage IV metastatic prostate cancer (metastasis to bones) diagnosed in May 2017 treated with chemotherapy, radiation, and ongoing [name] and Zytiga daily (started few months ago).  He is on chronic pain management due to his bone pain.  Currently on fentanyl 75 mcg patch along with hydrocodone 10/325 mg every 4 hours as needed for breakthrough pain. He averages one to four hydrocodone daily.   Patient reports his constipation has worsened in the past several months.  He has had some dose adjustments in his pain management and has had diminished appetite, reports eating less.  He has lost 15 pounds in the past few months.  At this time he may go 3 or 4 days without a bowel movement and then will take either MiraLAX or lactulose.  Also utilizes Benefiber.  Bowel regimen really not working for him very well.  His stools are soft but he is having difficulty passing them.  Reports having to strain and then feels sore in the upper abdomen for days afterwards.  When he does have a bowel movement he passes a significant amount of stool.  No current rectal bleeding although he had some mild rectal bleeding during his radiation for his prostate cancer.  He has a poor appetite.  He does not eat until he gets hungry.  Typically eats 1 meal per day.  No significant nausea or vomiting.  He does have solid food dysphagia, has to stop eating when it happens.  After several "burps" the food with eventually go down.  Often has to  excuse himself from the table until this happens.  Daughter has witnessed several times.  Denies any typical heartburn.  Lactulose causes significant cramps and abdominal pain.  Will take only when everything else has been ineffective.  Also has a history of developing epigastric pain during chemotherapy.  Was put on high-dose omeprazole at the time, currently takes omeprazole only when needed.  He had a CT of the chest/abdomen/pelvis in April which showed no evidence of abdominopelvic lymphadenopathy, no evidence of soft tissue metastasis in the abdomen and pelvis, stable densely sclerotic skeletal metastasis, interval increase in size of abdominal aortic aneurysm measuring up to 4.4 cm with recommendation for one-year follow-up.    Current Outpatient Medications  Medication Sig Dispense Refill  . abiraterone acetate (ZYTIGA) 250 MG tablet Take 3 tablets (750 mg total) by mouth daily. Take on an empty stomach 1 hour before or 2 hours after a meal 90 tablet 0  . Calcium Carb-Cholecalciferol (CALCIUM 500 + D3) 500-600 MG-UNIT TABS Take 2 tablets by mouth daily. 60 tablet 6  . ergocalciferol (VITAMIN D2) 50000 units capsule Take 1 capsule (50,000 Units total) by mouth every Friday. 30 capsule 4  . fentaNYL (DURAGESIC - DOSED MCG/HR) 75 MCG/HR Place 1 patch (75 mcg total) onto the skin every 3 (three) days. 10 patch 0  . HYDROcodone-acetaminophen (NORCO) 10-325 MG tablet Take 1 tablet by mouth every 4 (four) hours as needed. 80  tablet 0  . lactulose (CEPHULAC) 10 g packet Take 1 packet (10 g total) by mouth 3 (three) times daily as needed (if no bowel movement in 48hrs). 30 each 0  . lidocaine-prilocaine (EMLA) cream Apply a quarter size amount to port site 1 hour prior to chemo. Do not rub in. Cover with plastic wrap. 30 g 3  . omeprazole (PRILOSEC) 20 MG capsule Take 1 capsule (20 mg total) by mouth daily. (Patient taking differently: Take 20 mg by mouth as needed. ) 60 capsule 3  . polyethylene  glycol (MIRALAX / GLYCOLAX) packet Take 17 g by mouth daily as needed.    . predniSONE (DELTASONE) 5 MG tablet Take 1 tablet (5 mg total) by mouth 2 (two) times daily with a meal. 60 tablet 3  . prochlorperazine (COMPAZINE) 10 MG tablet Take 1 tablet (10 mg total) by mouth every 6 (six) hours as needed for nausea or vomiting. 30 tablet 0  . rosuvastatin (CRESTOR) 20 MG tablet TAKE ONE TABLET BY MOUTH ONCE DAILY 30 tablet 11  . temazepam (RESTORIL) 15 MG capsule Take 1 capsule (15 mg total) by mouth at bedtime as needed for sleep. 30 capsule 0  . Wheat Dextrin (BENEFIBER PO) Take by mouth as needed.     No current facility-administered medications for this visit.     Allergies as of 02/15/2018 - Review Complete 02/15/2018  Allergen Reaction Noted  . Meloxicam Other (See Comments) 12/10/2015    Past Medical History:  Diagnosis Date  . Arthritis    Left knee  . Bone cancer ([place])   . Bone metastases ([place]) 12/26/2015  . Cancer [facility])    Prostate  . DVT (deep venous thrombosis) ([place]) 2004  . Kidney stones    ~2002  . Prostate carcinoma ([place]) 12/26/2015    Past Surgical History:  Procedure Laterality Date  . CIRCUMCISION  30 years ago  . MULTIPLE EXTRACTIONS WITH ALVEOLOPLASTY N/A 03/06/2016   Procedure: Extraction of tooth #'s 3,5,6,8-12,20-22, 27, 28, and 31 with alveoloplasty and bilateral mandibular tori reductions;  Surgeon: Lenn Cal, DDS;  Location: [place] [facility];  Service: Oral Surgery;  Laterality: N/A;    Family History  Problem Relation Age of Onset  . Cancer Mother     Social History   Socioeconomic History  . Marital status: Single    Spouse name: Not on file  . Number of children: 2  . Years of education: Not on file  . Highest education level: Not on file  Occupational History  . Occupation: Research scientist (life sciences)  Social Needs  . Financial resource strain: Not on file  . Food insecurity:    Worry: Not on file    Inability: Not on file  . Transportation  needs:    Medical: Not on file    Non-medical: Not on file  Tobacco Use  . Smoking status: Current Every Day Smoker    Packs/day: 1.00    Years: 50.00    Pack years: 50.00    Types: Cigarettes    Start date: 06/16/1965  . Smokeless tobacco: Former Systems developer    Types: [place] date: 08/04/2005  Substance and Sexual Activity  . Alcohol use: No    Alcohol/week: 0.0 oz  . Drug use: Yes    Types: Marijuana    Comment: THC for appetite; occ  . Sexual activity: Not on file  Lifestyle  . Physical activity:    Days per week: Not on file    Minutes per  session: Not on file  . Stress: Not on file  Relationships  . Social connections:    Talks on phone: Not on file    Gets together: Not on file    Attends religious service: Not on file    Active member of club or organization: Not on file    Attends meetings of clubs or organizations: Not on file    Relationship status: Not on file  . Intimate partner violence:    Fear of current or ex partner: Not on file    Emotionally abused: Not on file    Physically abused: Not on file    Forced sexual activity: Not on file  Other Topics Concern  . Not on file  Social History Narrative  . Not on file      ROS:  General: poor appetite. Weight down 15 pounds since 11/2017/. No fever, chills, fatigue, weakness. Eyes: Negative for vision changes.  ENT: Negative for hoarseness, difficulty swallowing , nasal congestion. CV: Negative for chest pain, angina, palpitations, dyspnea on exertion, peripheral edema.  Respiratory: Negative for dyspnea at rest, dyspnea on exertion, cough, sputum, wheezing.  GI: See history of present illness. GU:  Negative for dysuria, hematuria, urinary incontinence, urinary frequency, nocturnal urination.  MS: +back/bone pain Derm: Negative for rash or itching.  Neuro: Negative for weakness, abnormal sensation, seizure, frequent headaches, memory loss, confusion.  Psych: Negative for anxiety, depression, suicidal  ideation, hallucinations.  Endo: see hpi Heme: Negative for bruising or bleeding. Allergy: Negative for rash or hives.    Physical Examination:  BP 112/78   Pulse 95   Temp 98.1 F (36.7 C) (Oral)   Ht 6' (1.829 m)   Wt 228 lb 12.8 oz (103.8 kg)   BMI 31.03 kg/m    General: somewhat chronically appearing male, in no acute distress. Accompanied by daughter Head: Normocephalic, atraumatic.   Eyes: Conjunctiva pink, no icterus. Mouth: Oropharyngeal mucosa moist and pink , no lesions erythema or exudate. Neck: Supple without thyromegaly, masses, or lymphadenopathy.  Lungs: Clear to auscultation bilaterally.  Heart: Regular rate and rhythm, no murmurs rubs or gallops.  Abdomen: Bowel sounds are normal, nontender, nondistended, no hepatosplenomegaly or masses, no abdominal bruits or hernia , no rebound or guarding.   Rectal: not performed Extremities: No lower extremity edema. No clubbing or deformities.  Neuro: Alert and oriented x 4 , grossly normal neurologically.  Skin: Warm and dry, no rash or jaundice.   Psych: Alert and cooperative, normal mood and affect.  Labs: Lab Results  Component Value Date   CREATININE 0.88 01/29/2018   BUN 8 01/29/2018   NA 141 01/29/2018   K 3.6 01/29/2018   CL 108 01/29/2018   CO2 23 01/29/2018   Lab Results  Component Value Date   ALT 11 01/29/2018   AST 15 01/29/2018   ALKPHOS 72 01/29/2018   BILITOT 0.7 01/29/2018   Lab Results  Component Value Date   WBC 4.0 01/29/2018   HGB 13.8 01/29/2018   HCT 41.2 01/29/2018   MCV 91.6 01/29/2018   PLT 151 01/29/2018     Imaging Studies: No results found.  Impression/plan:  Very pleasant 61 year old gentleman with history of stage IV prostate cancer with metastases to the bone who presents for further management of constipation, consideration of colonoscopy.  Likely has opioid induced constipation.  Currently poorly managed with MiraLAX and Benefiber.  Patient has significant cramping  with lactulose.  There are multiple prescription options for opioid induced constipation.  We did discuss possibility of colonoscopy and patient and daughter both desire pursuing.  Importance of management of his constipation specifically prior to bowel preparation reinforced with patient today.  Given polypharmacy, chronic narcotics, we will plan for deep sedation.  I have discussed the risks, alternatives, benefits with regards to but not limited to the risk of reaction to medication, bleeding, infection, perforation and the patient is agreeable to proceed. Written consent to be obtained.  In addition he complains of loss of appetite, weight loss for the most part symptoms have been present since his diagnosis of prostate cancer in 2017.  Symptoms somewhat worse since addition of Zytiga.  He complains of epigastric pain, solid food dysphagia persisting on omeprazole 20 mg daily.  Offered upper endoscopy with esophageal dilation in the near future.  Plan for deep sedation, given polypharmacy/chronic narcotics.  I have discussed the risks, alternatives, benefits with regards to but not limited to the risk of reaction to medication, bleeding, infection, perforation and the patient is agreeable to proceed. Written consent to be obtained.

## 2018-02-15 NOTE — Progress Notes (Signed)
Discussed with scheduling staff. Patient was offered sooner date for procedures but due to their scheduling conflict procedures delayed some.

## 2018-02-15 NOTE — Patient Instructions (Signed)
1. Start Amitiza 37mcg twice daily with food to prevent/treat constipation. The first few days you may have several bowel movements and they may be loose. Continue medication unless you have more than 5 BMs within a 24 hours period of time, at that time you should skip 1 to 2 doses and restart. We do not want you to get dehydrated.  2. I have sent RX to [facility] and provided you with samples to try.  3. Call with any problems on Amitiza, ie if not working well enough or too strong and we will make adjustments.  4. Stop Miralax and lactulose.  5. Colonoscopy and upper endoscopy as scheduled. See separate instructions.

## 2018-02-26 ENCOUNTER — Other Ambulatory Visit (HOSPITAL_COMMUNITY): Payer: Self-pay

## 2018-02-26 ENCOUNTER — Ambulatory Visit (HOSPITAL_COMMUNITY): Payer: Self-pay

## 2018-03-01 ENCOUNTER — Other Ambulatory Visit (HOSPITAL_COMMUNITY): Payer: Self-pay | Admitting: *Deleted

## 2018-03-01 ENCOUNTER — Inpatient Hospital Stay (HOSPITAL_COMMUNITY): Payer: BLUE CROSS/BLUE SHIELD | Admitting: Hematology

## 2018-03-01 ENCOUNTER — Inpatient Hospital Stay (HOSPITAL_COMMUNITY): Payer: BLUE CROSS/BLUE SHIELD | Attending: Hematology and Oncology

## 2018-03-01 ENCOUNTER — Encounter (HOSPITAL_COMMUNITY): Payer: Self-pay

## 2018-03-01 ENCOUNTER — Inpatient Hospital Stay (HOSPITAL_COMMUNITY): Payer: BLUE CROSS/BLUE SHIELD

## 2018-03-01 VITALS — BP 118/72 | HR 85 | Temp 98.1°F | Resp 18

## 2018-03-01 DIAGNOSIS — C7951 Secondary malignant neoplasm of bone: Secondary | ICD-10-CM | POA: Diagnosis present

## 2018-03-01 DIAGNOSIS — G893 Neoplasm related pain (acute) (chronic): Secondary | ICD-10-CM

## 2018-03-01 DIAGNOSIS — C61 Malignant neoplasm of prostate: Secondary | ICD-10-CM | POA: Insufficient documentation

## 2018-03-01 LAB — CBC WITH DIFFERENTIAL/PLATELET
BASOS ABS: 0 10*3/uL (ref 0.0–0.1)
Basophils Relative: 0 %
EOS ABS: 0.1 10*3/uL (ref 0.0–0.7)
Eosinophils Relative: 3 %
HCT: 39.9 % (ref 39.0–52.0)
HEMOGLOBIN: 13.5 g/dL (ref 13.0–17.0)
LYMPHS ABS: 0.9 10*3/uL (ref 0.7–4.0)
Lymphocytes Relative: 18 %
MCH: 31.1 pg (ref 26.0–34.0)
MCHC: 33.8 g/dL (ref 30.0–36.0)
MCV: 91.9 fL (ref 78.0–100.0)
Monocytes Absolute: 0.4 10*3/uL (ref 0.1–1.0)
Monocytes Relative: 7 %
NEUTROS PCT: 72 %
Neutro Abs: 3.6 10*3/uL (ref 1.7–7.7)
Platelets: 163 10*3/uL (ref 150–400)
RBC: 4.34 MIL/uL (ref 4.22–5.81)
RDW: 14.1 % (ref 11.5–15.5)
WBC: 5.1 10*3/uL (ref 4.0–10.5)

## 2018-03-01 LAB — COMPREHENSIVE METABOLIC PANEL
ALBUMIN: 3.7 g/dL (ref 3.5–5.0)
ALK PHOS: 72 U/L (ref 38–126)
ALT: 12 U/L (ref 0–44)
AST: 12 U/L — ABNORMAL LOW (ref 15–41)
Anion gap: 8 (ref 5–15)
BUN: 16 mg/dL (ref 8–23)
CALCIUM: 8.2 mg/dL — AB (ref 8.9–10.3)
CO2: 23 mmol/L (ref 22–32)
CREATININE: 1 mg/dL (ref 0.61–1.24)
Chloride: 107 mmol/L (ref 98–111)
GFR calc Af Amer: >60 mL/min (ref >60)
GFR calc non Af Amer: >60 mL/min (ref >60)
GLUCOSE: 103 mg/dL — AB (ref 70–99)
Potassium: 3.9 mmol/L (ref 3.5–5.1)
SODIUM: 138 mmol/L (ref 135–145)
Total Bilirubin: 0.7 mg/dL (ref 0.3–1.2)
Total Protein: 6.8 g/dL (ref 6.5–8.1)

## 2018-03-01 LAB — PSA: Prostatic Specific Antigen: 2.48 ng/mL (ref 0.00–4.00)

## 2018-03-01 MED ORDER — FENTANYL 75 MCG/HR TD PT72: 75.0000 ug | MEDICATED_PATCH | TRANSDERMAL | 0 refills | Status: DC

## 2018-03-01 MED ORDER — HYDROCODONE-ACETAMINOPHEN 10-325 MG PO TABS: 1.0000 | ORAL_TABLET | ORAL | 0 refills | Status: DC | PRN

## 2018-03-01 MED ORDER — DENOSUMAB 120 MG/1.7ML ~~LOC~~ SOLN
120.0000 mg | Freq: Once | SUBCUTANEOUS | Status: AC
  Administered 2018-03-01: 120 mg via SUBCUTANEOUS
  Filled 2018-03-01: qty 1.7

## 2018-03-01 NOTE — Patient Instructions (Signed)
[place] at [facility]  Discharge Instructions:  You received an xgeva today.  _______________________________________________________________  Thank you for choosing [place] at [facility] to provide your oncology and hematology care.  To afford each patient quality time with our providers, please arrive at least 15 minutes before your scheduled appointment.  You need to re-schedule your appointment if you arrive 10 or more minutes late.  We strive to give you quality time with our providers, and arriving late affects you and other patients whose appointments are after yours.  Also, if you no show three or more times for appointments you may be dismissed from the clinic.  Again, thank you for choosing [place] at [place] hope is that these requests will allow you access to exceptional care and in a timely manner. _______________________________________________________________  If you have questions after your visit, please contact our office at [phone] between the hours of 8:30 a.m. and 5:00 p.m. Voicemails left after 4:30 p.m. will not be returned until the following business day. _______________________________________________________________  For prescription refill requests, have your pharmacy contact our office. _______________________________________________________________  Recommendations made by the consultant and any test results will be sent to your referring physician. _______________________________________________________________

## 2018-03-01 NOTE — Progress Notes (Signed)
Patient taking calcium tabs as directed.  Denied jaw, tooth, or leg pain.  Tolerated xgeva with no complaints voiced.  Injection site clean and dry with no bruising or swelling noted at site.  Band aid applied.  VSS with discharge and left ambulatory with no s/s of distress noted.

## 2018-03-04 ENCOUNTER — Inpatient Hospital Stay (HOSPITAL_COMMUNITY): Payer: BLUE CROSS/BLUE SHIELD | Attending: Hematology and Oncology | Admitting: Hematology

## 2018-03-04 ENCOUNTER — Other Ambulatory Visit: Payer: Self-pay

## 2018-03-04 ENCOUNTER — Encounter (HOSPITAL_COMMUNITY): Payer: Self-pay | Admitting: Hematology

## 2018-03-04 VITALS — BP 108/66 | HR 81 | Temp 98.1°F | Resp 16 | Wt 229.0 lb

## 2018-03-04 DIAGNOSIS — G893 Neoplasm related pain (acute) (chronic): Secondary | ICD-10-CM

## 2018-03-04 DIAGNOSIS — Z86718 Personal history of other venous thrombosis and embolism: Secondary | ICD-10-CM

## 2018-03-04 DIAGNOSIS — G4700 Insomnia, unspecified: Secondary | ICD-10-CM | POA: Diagnosis not present

## 2018-03-04 DIAGNOSIS — M1712 Unilateral primary osteoarthritis, left knee: Secondary | ICD-10-CM

## 2018-03-04 DIAGNOSIS — C7951 Secondary malignant neoplasm of bone: Secondary | ICD-10-CM | POA: Insufficient documentation

## 2018-03-04 DIAGNOSIS — F17210 Nicotine dependence, cigarettes, uncomplicated: Secondary | ICD-10-CM | POA: Diagnosis not present

## 2018-03-04 DIAGNOSIS — C61 Malignant neoplasm of prostate: Secondary | ICD-10-CM | POA: Insufficient documentation

## 2018-03-04 DIAGNOSIS — Z5111 Encounter for antineoplastic chemotherapy: Secondary | ICD-10-CM | POA: Diagnosis present

## 2018-03-04 MED ORDER — ABIRATERONE ACETATE 250 MG PO TABS: 1000.0000 mg | ORAL_TABLET | Freq: Every day | ORAL | 0 refills | Status: DC

## 2018-03-04 NOTE — Assessment & Plan Note (Signed)
1.  Metastatic prostate cancer to the bones: - Status post chemotherapy with docetaxel from 01/07/2016 through 04/11/2016, likely in the castration sensitive setting. -Recent rising PSA levels, patient maintained on [name], last injection 22.5 mg on 10/02/2017 -Most recent PSA of 7, CT scan (11/17/2017) and bone scan (11/13/2017) showing slight worsening of bone metastasis, no evidence of visceral metastasis - Abiraterone 1000mg  daily with prednisone 5 mg twice a day started on 12/02/2017.  He complained of worsening of pains in his lower back and spine region since the start of Abiraterone.  He will cut back on the dose of Abiraterone to 750 mg daily at last visit on 01/29/2018.  His PSA has gone up to 2.4 today.  Hence I will increase his Abiraterone to 1000 mg alternating with 750 mg daily.  He is having a lot of pains when he takes full dose of Abiraterone. -He will be due for [name] in a month.  2.  Prostate cancer related bone pain: He is on fentanyl patch 75 mcg daily.  He takes hydrocodone 5 mg 1 tablet every 4 hours as needed for breakthrough pain.  Some days he does not need any pain medication when he is not doing anything.  On other occasions he might have to take 2 tablets at one time.  He also complains of left knee pain with swelling.  He had a history of left knee effusions which were drained.  We will make a referral to orthopedics.  3.  Bone metastasis: He will continue monthly denosumab injections.  He takes 2 calcium pills in the morning.  4.  Difficulty sleeping: He will continue temazepam 15 to 30 mg at bedtime.

## 2018-03-04 NOTE — Patient Instructions (Addendum)
[place] at [facility] Discharge Instructions  Follow up in 1 month with labs a few days prior. Take 4 tabs alternating days with 3 days of your Zytiga. Consult to Ortho for your knee pain.  You were seen today by Dr. Delton Coombes.    Thank you for choosing [place] at [facility] to provide your oncology and hematology care.  To afford each patient quality time with our provider, please arrive at least 15 minutes before your scheduled appointment time.   If you have a lab appointment with the [place] please come in thru the  Main Entrance and check in at the main information desk  You need to re-schedule your appointment should you arrive 10 or more minutes late.  We strive to give you quality time with our providers, and arriving late affects you and other patients whose appointments are after yours.  Also, if you no show three or more times for appointments you may be dismissed from the clinic at the providers discretion.     Again, thank you for choosing [facility].  Our hope is that these requests will decrease the amount of time that you wait before being seen by our physicians.       _____________________________________________________________  Should you have questions after your visit to [facility], please contact our office at [phone] between the hours of 8:00 a.m. and 4:30 p.m.  Voicemails left after 4:00 p.m. will not be returned until the following business day.  For prescription refill requests, have your pharmacy contact our office and allow 72 hours.    Cancer Center Support Programs:   > Cancer Support Group  2nd Tuesday of the month 1pm-2pm, Journey Room

## 2018-03-04 NOTE — Progress Notes (Signed)
[place], [place] [address]   CLINIC:  Medical Oncology/Hematology  PCP:  [name], [place] [address] [phone]   REASON FOR VISIT:  Follow-up for metastatic prostate cancer to the bones  CURRENT THERAPY: Zytiga and prednisone  BRIEF ONCOLOGIC HISTORY:    Prostate carcinoma ([place])   12/10/2015 Imaging    CTA chest, multiple thoracic and rib osseous lesions. no primary evident. Atherosclerosis including aortic and CAD      12/12/2015 Imaging    CT abdomen/pelvis with sclerotic osseous lesions througout the lumbar spine, sacrum, bilateral iliac bones and L proximal femur worrisome for sclerotic osseous mets,mild prostatomegaly, no LAD, Infrarenal 3.8 cm AAA      12/12/2015 Tumor Marker    PSA 153.85      12/18/2015 Imaging    Bone Scan Diffuse bony metastatic disease, posterior calvarium, sternum, thoracic, lumbar spine, bilateral ribs, L shoulder, bilateral bony pelvis, proximal L femur      12/24/2015 Initial Biopsy    CT biopsy of L iliac bone lesion      12/26/2015 Pathology Results    Bone, biopsy, left iliac - POSITIVE FOR METASTATIC ADENOCARCINOMA.      12/28/2015 - 09/01/2016 Chemotherapy    Firmagon instituted 240 mg       01/01/2016 Procedure    Port placed by IR.      01/07/2016 - 04/11/2016 Chemotherapy    Docetaxel every 21 days x 6 cycles with dose reductions due to tolerance      02/18/2016 Adverse Reaction    GI toxicity from docetaxel, dose reduced to 60 mg/m2      02/18/2016 Treatment Plan Change    Docetaxel dose reduced by 20%      03/06/2016 Procedure    Dr. Enrique Sack- Multiple extraction of tooth numbers 3, 5, 6, 8, 9, 10, 11, 12, 20, 21, 22, 27, 28, and 31. 4 Quadrants of alveoloplasty. Bilateral mandibular lingual tori reductions.      04/09/2016 Imaging    Brain MRI 1. No intracranial metastatic disease 2. No acute intracranial abnormality 3. Findings of chronic  microvascular disease      05/14/2016 Imaging    CT C/A/P Stable appearing diffuse bony metastatic disease. No new significant disease is seen Innumerable patchy sclerotic osseous metastases throughout the axial and proximal appendicular skeleton, stable in size and distribution, significantly increased in density in the interval, likely reflecting treatment effect. 2. No new or progressive metastatic disease in the chest, abdomen or pelvis. 3. Bilateral perifissural pulmonary nodules are stable and probably benign. 4. Aortic atherosclerosis. Infrarenal 4.3 cm abdominal aortic aneurysm, minimally increased in size. Recommend followup by ultrasound in 1 year.      06/02/2016 Tumor Marker    PSA 0.99 ng/ml       - 07/31/2016 Radiation Therapy    Palliative XRT by Dr. Lianne Cure in [place].       Chemotherapy    Depo-[name] every 3 months, beginning in Feb 2018         CANCER STAGING: Cancer Staging Prostate carcinoma [facility]) Staging form: Prostate, AJCC 7th Edition - Clinical stage from 12/24/2015: Stage IV (TX, N0, M1b, PSA: 20 or greater) - Signed by Baird Cancer, PA-C on 01/07/2016    INTERVAL HISTORY:  [name] 62 y.o. male returns for routine follow-up for metastatic prostate cancer to the bones. Patient is feeling good since we cut back on his zytiga dose. His pain is a little  better in his hips and back since the dose was adjusted. He is still having to take pain pills 6 a day if he does any actitiy during the day due to the pain. Someday when he lays around the house he doesn't need the narcotics. Patients energy levels and appetite are about 50%. He has arthritis in his left knee. IT is swollen and painful. He is wanting a referral to Ortho to have it drained. He has done this multiple times in the past. Patient denies any fever, chills, or recent infections. Denies any SOB.    REVIEW OF SYSTEMS:  Review of Systems  Constitutional: Negative.   HENT:   Positive for  trouble swallowing.   Eyes: Negative.   Respiratory: Negative.   Cardiovascular: Negative.   Gastrointestinal: Negative.   Endocrine: Negative.   Genitourinary: Negative.    Skin: Negative.   Neurological: Positive for extremity weakness.  Hematological: Negative.   Psychiatric/Behavioral: Negative.      PAST MEDICAL/SURGICAL HISTORY:  Past Medical History:  Diagnosis Date  . Arthritis    Left knee  . Bone cancer ([place])   . Bone metastases ([place]) 12/26/2015  . Cancer [facility])    Prostate  . DVT (deep venous thrombosis) ([place]) 2004  . Kidney stones    ~2002  . Prostate carcinoma ([place]) 12/26/2015   Past Surgical History:  Procedure Laterality Date  . CIRCUMCISION  30 years ago  . MULTIPLE EXTRACTIONS WITH ALVEOLOPLASTY N/A 03/06/2016   Procedure: Extraction of tooth #'s 3,5,6,8-12,20-22, 27, 28, and 31 with alveoloplasty and bilateral mandibular tori reductions;  Surgeon: Lenn Cal, DDS;  Location: [facility];  Service: Oral Surgery;  Laterality: N/A;     SOCIAL HISTORY:  Social History   Socioeconomic History  . Marital status: Single    Spouse name: Not on file  . Number of children: 2  . Years of education: Not on file  . Highest education level: Not on file  Occupational History  . Occupation: Research scientist (life sciences)  Social Needs  . Financial resource strain: Not on file  . Food insecurity:    Worry: Not on file    Inability: Not on file  . Transportation needs:    Medical: Not on file    Non-medical: Not on file  Tobacco Use  . Smoking status: Current Every Day Smoker    Packs/day: 1.00    Years: 50.00    Pack years: 50.00    Types: Cigarettes    Start date: 06/16/1965  . Smokeless tobacco: Former Systems developer    Types: [place] date: 08/04/2005  Substance and Sexual Activity  . Alcohol use: No    Alcohol/week: 0.0 oz  . Drug use: Yes    Types: Marijuana    Comment: THC for appetite; occ  . Sexual activity: Not on file  Lifestyle  . Physical activity:     Days per week: Not on file    Minutes per session: Not on file  . Stress: Not on file  Relationships  . Social connections:    Talks on phone: Not on file    Gets together: Not on file    Attends religious service: Not on file    Active member of club or organization: Not on file    Attends meetings of clubs or organizations: Not on file    Relationship status: Not on file  . Intimate partner violence:    Fear of current or ex partner: Not on file  Emotionally abused: Not on file    Physically abused: Not on file    Forced sexual activity: Not on file  Other Topics Concern  . Not on file  Social History Narrative  . Not on file    FAMILY HISTORY:  Family History  Problem Relation Age of Onset  . Cancer Mother     CURRENT MEDICATIONS:  Outpatient Encounter Medications as of 03/04/2018  Medication Sig  . Calcium Carb-Cholecalciferol (CALCIUM 500 + D3) 500-600 MG-UNIT TABS Take 2 tablets by mouth daily.  . ergocalciferol (VITAMIN D2) 50000 units capsule Take 1 capsule (50,000 Units total) by mouth every Friday.  . fentaNYL (DURAGESIC - DOSED MCG/HR) 75 MCG/HR Place 1 patch (75 mcg total) onto the skin every 3 (three) days.  Mar[place] HYDROcodone-acetaminophen (NORCO) 10-325 MG tablet Take 1 tablet by mouth every 4 (four) hours as needed.  . lidocaine-prilocaine (EMLA) cream Apply a quarter size amount to port site 1 hour prior to chemo. Do not rub in. Cover with plastic wrap.  . lubiprostone (AMITIZA) 24 MCG capsule Take 1 capsule (24 mcg total) by mouth 2 (two) times daily with a meal.  . omeprazole (PRILOSEC) 20 MG capsule Take 1 capsule (20 mg total) by mouth daily. (Patient taking differently: Take 20 mg by mouth as needed. )  . predniSONE (DELTASONE) 5 MG tablet Take 1 tablet (5 mg total) by mouth 2 (two) times daily with a meal.  . prochlorperazine (COMPAZINE) 10 MG tablet Take 1 tablet (10 mg total) by mouth every 6 (six) hours as needed for nausea or vomiting.  . rosuvastatin  (CRESTOR) 20 MG tablet TAKE ONE TABLET BY MOUTH ONCE DAILY  . temazepam (RESTORIL) 15 MG capsule Take 1 capsule (15 mg total) by mouth at bedtime as needed for sleep.  . Wheat Dextrin (BENEFIBER PO) Take by mouth as needed.   No facility-administered encounter medications on file as of 03/04/2018.     ALLERGIES:  Allergies  Allergen Reactions  . Meloxicam Other (See Comments)    Caused stomach bleeding.     PHYSICAL EXAM:  ECOG Performance status: 1  Vitals:   03/04/18 1000  BP: 108/66  Pulse: 81  Resp: 16  Temp: 98.1 F (36.7 C)  SpO2: 99%   Filed Weights   03/04/18 1000  Weight: 229 lb (103.9 kg)    Physical Exam  Constitutional: He is oriented to person, place, and time.  Cardiovascular: Normal rate, regular rhythm and normal heart sounds.  Pulmonary/Chest: Effort normal and breath sounds normal.  Neurological: He is alert and oriented to person, place, and time.  Skin: Skin is warm and dry.     LABORATORY DATA:  I have reviewed the labs as listed.  CBC    Component Value Date/Time   WBC 5.1 03/01/2018 1355   RBC 4.34 03/01/2018 1355   HGB 13.5 03/01/2018 1355   HCT 39.9 03/01/2018 1355   PLT 163 03/01/2018 1355   MCV 91.9 03/01/2018 1355   MCH 31.1 03/01/2018 1355   MCHC 33.8 03/01/2018 1355   RDW 14.1 03/01/2018 1355   LYMPHSABS 0.9 03/01/2018 1355   MONOABS 0.4 03/01/2018 1355   EOSABS 0.1 03/01/2018 1355   BASOSABS 0.0 03/01/2018 1355   CMP Latest Ref Rng & Units 03/01/2018 01/29/2018 01/01/2018  Glucose 70 - 99 mg/dL 103(H) 116(H) 114(H)  BUN 8 - 23 mg/dL 16 8 12   Creatinine 0.61 - 1.24 mg/dL 1.00 0.88 1.13  Sodium 135 - 145 mmol/L 138 141 139  Potassium 3.5 - 5.1 mmol/L 3.9 3.6 3.8  Chloride 98 - 111 mmol/L 107 108 107  CO2 22 - 32 mmol/L 23 23 24   Calcium 8.9 - 10.3 mg/dL 8.2(L) 8.5(L) 8.8(L)  Total Protein 6.5 - 8.1 g/dL 6.8 6.7 6.6  Total Bilirubin 0.3 - 1.2 mg/dL 0.7 0.7 0.5  Alkaline Phos 38 - 126 U/L 72 72 71  AST 15 - 41 U/L 12(L) 15  19  ALT 0 - 44 U/L 12 11 14(L)       DIAGNOSTIC IMAGING:  *The following radiologic images and reports have been reviewed independently and agree with below findings.      ASSESSMENT & PLAN:   Prostate carcinoma ([place]) 1.  Metastatic prostate cancer to the bones: - Status post chemotherapy with docetaxel from 01/07/2016 through 04/11/2016, likely in the castration sensitive setting. -Recent rising PSA levels, patient maintained on [name], last injection 22.5 mg on 10/02/2017 -Most recent PSA of 7, CT scan (11/17/2017) and bone scan (11/13/2017) showing slight worsening of bone metastasis, no evidence of visceral metastasis - Abiraterone 1000mg  daily with prednisone 5 mg twice a day started on 12/02/2017.  He complained of worsening of pains in his lower back and spine region since the start of Abiraterone.  He will cut back on the dose of Abiraterone to 750 mg daily at last visit on 01/29/2018.  His PSA has gone up to 2.4 today.  Hence I will increase his Abiraterone to 1000 mg alternating with 750 mg daily.  He is having a lot of pains when he takes full dose of Abiraterone. -He will be due for [name] in a month.  2.  Prostate cancer related bone pain: He is on fentanyl patch 75 mcg daily.  He takes hydrocodone 5 mg 1 tablet every 4 hours as needed for breakthrough pain.  Some days he does not need any pain medication when he is not doing anything.  On other occasions he might have to take 2 tablets at one time.  He also complains of left knee pain with swelling.  He had a history of left knee effusions which were drained.  We will make a referral to orthopedics.  3.  Bone metastasis: He will continue monthly denosumab injections.  He takes 2 calcium pills in the morning.  4.  Difficulty sleeping: He will continue temazepam 15 to 30 mg at bedtime.      Orders placed this encounter:  Orders Placed This Encounter  Procedures  . CBC with Differential/Platelet  . Comprehensive metabolic panel    . PSA      Derek Jack, MD [place] [phone]

## 2018-03-26 ENCOUNTER — Encounter (HOSPITAL_COMMUNITY): Payer: Self-pay

## 2018-03-26 ENCOUNTER — Inpatient Hospital Stay (HOSPITAL_COMMUNITY): Payer: BLUE CROSS/BLUE SHIELD

## 2018-03-26 ENCOUNTER — Other Ambulatory Visit: Payer: Self-pay

## 2018-03-26 VITALS — BP 117/61 | HR 80

## 2018-03-26 DIAGNOSIS — C61 Malignant neoplasm of prostate: Secondary | ICD-10-CM

## 2018-03-26 DIAGNOSIS — Z5111 Encounter for antineoplastic chemotherapy: Secondary | ICD-10-CM | POA: Diagnosis not present

## 2018-03-26 LAB — COMPREHENSIVE METABOLIC PANEL
ALBUMIN: 3.9 g/dL (ref 3.5–5.0)
ALK PHOS: 62 U/L (ref 38–126)
ALT: 12 U/L (ref 0–44)
ANION GAP: 9 (ref 5–15)
AST: 13 U/L — ABNORMAL LOW (ref 15–41)
BUN: 12 mg/dL (ref 8–23)
CALCIUM: 10.1 mg/dL (ref 8.9–10.3)
CO2: 26 mmol/L (ref 22–32)
Chloride: 106 mmol/L (ref 98–111)
Creatinine, Ser: 1.06 mg/dL (ref 0.61–1.24)
GFR calc Af Amer: >60 mL/min (ref >60)
GFR calc non Af Amer: >60 mL/min (ref >60)
GLUCOSE: 97 mg/dL (ref 70–99)
Potassium: 4.1 mmol/L (ref 3.5–5.1)
SODIUM: 141 mmol/L (ref 135–145)
Total Bilirubin: 0.6 mg/dL (ref 0.3–1.2)
Total Protein: 6.9 g/dL (ref 6.5–8.1)

## 2018-03-26 LAB — CBC WITH DIFFERENTIAL/PLATELET
Basophils Absolute: 0 10*3/uL (ref 0.0–0.1)
Basophils Relative: 1 %
Eosinophils Absolute: 0.1 10*3/uL (ref 0.0–0.7)
Eosinophils Relative: 2 %
HCT: 42 % (ref 39.0–52.0)
Hemoglobin: 14 g/dL (ref 13.0–17.0)
Lymphocytes Relative: 24 %
Lymphs Abs: 1.4 10*3/uL (ref 0.7–4.0)
MCH: 30.8 pg (ref 26.0–34.0)
MCHC: 33.3 g/dL (ref 30.0–36.0)
MCV: 92.3 fL (ref 78.0–100.0)
Monocytes Absolute: 0.4 10*3/uL (ref 0.1–1.0)
Monocytes Relative: 7 %
Neutro Abs: 4 10*3/uL (ref 1.7–7.7)
Neutrophils Relative %: 66 %
Platelets: 165 10*3/uL (ref 150–400)
RBC: 4.55 MIL/uL (ref 4.22–5.81)
RDW: 14 % (ref 11.5–15.5)
WBC: 6 10*3/uL (ref 4.0–10.5)

## 2018-03-26 LAB — PSA: Prostatic Specific Antigen: 2.45 ng/mL (ref 0.00–4.00)

## 2018-03-26 MED ORDER — DENOSUMAB 120 MG/1.7ML ~~LOC~~ SOLN
120.0000 mg | Freq: Once | SUBCUTANEOUS | Status: AC
  Administered 2018-03-26: 120 mg via SUBCUTANEOUS
  Filled 2018-03-26: qty 1.7

## 2018-03-26 MED ORDER — LEUPROLIDE ACETATE (3 MONTH) 22.5 MG IM KIT
22.5000 mg | PACK | Freq: Once | INTRAMUSCULAR | Status: AC
  Administered 2018-03-26: 22.5 mg via INTRAMUSCULAR
  Filled 2018-03-26: qty 22.5

## 2018-03-26 NOTE — Progress Notes (Signed)
xgeva and lupron given today per orders. See MAR for details. Patient tolerated it well without problems. Vitals stable and discharged home from clinic ambulatory. Follow up as scheduled.   In basket message sent to MD and NP for pain medication request.

## 2018-03-26 NOTE — Patient Instructions (Signed)
[place] at [facility] Discharge Instructions  xgeva and lupron given today Follow up as scheduled.  Thank you for choosing [place] at [facility] to provide your oncology and hematology care.  To afford each patient quality time with our provider, please arrive at least 15 minutes before your scheduled appointment time.   If you have a lab appointment with the [facility] please come in thru the  Main Entrance and check in at the main information desk  You need to re-schedule your appointment should you arrive 10 or more minutes late.  We strive to give you quality time with our providers, and arriving late affects you and other patients whose appointments are after yours.  Also, if you no show three or more times for appointments you may be dismissed from the clinic at the providers discretion.     Again, thank you for choosing [facility]  Our hope is that these requests will decrease the amount of time that you wait before being seen by our physicians.       _____________________________________________________________  Should you have questions after your visit to [facility], please contact our office at [phone] between the hours of 8:00 a.m. and 4:30 p.m.  Voicemails left after 4:00 p.m. will not be returned until the following business day.  For prescription refill requests, have your pharmacy contact our office and allow 72 hours.    Cancer Center Support Programs:   > Cancer Support Group  2nd Tuesday of the month 1pm-2pm, Journey Room

## 2018-03-29 ENCOUNTER — Other Ambulatory Visit (HOSPITAL_COMMUNITY): Payer: Self-pay | Admitting: *Deleted

## 2018-03-29 DIAGNOSIS — C61 Malignant neoplasm of prostate: Secondary | ICD-10-CM

## 2018-03-29 MED ORDER — ABIRATERONE ACETATE 250 MG PO TABS: 1000.0000 mg | ORAL_TABLET | Freq: Every day | ORAL | 0 refills | Status: DC

## 2018-03-29 NOTE — Telephone Encounter (Signed)
Chart reviewed, zytiga to be continued, it was refilled.

## 2018-03-31 ENCOUNTER — Telehealth: Payer: Self-pay | Admitting: *Deleted

## 2018-03-31 ENCOUNTER — Other Ambulatory Visit (HOSPITAL_COMMUNITY): Payer: Self-pay | Admitting: *Deleted

## 2018-03-31 DIAGNOSIS — G893 Neoplasm related pain (acute) (chronic): Secondary | ICD-10-CM

## 2018-03-31 DIAGNOSIS — C7951 Secondary malignant neoplasm of bone: Secondary | ICD-10-CM

## 2018-03-31 DIAGNOSIS — C61 Malignant neoplasm of prostate: Secondary | ICD-10-CM

## 2018-03-31 MED ORDER — HYDROCODONE-ACETAMINOPHEN 10-325 MG PO TABS: 1.0000 | ORAL_TABLET | ORAL | 0 refills | Status: DC | PRN

## 2018-03-31 MED ORDER — FENTANYL 75 MCG/HR TD PT72: 75.0000 ug | MEDICATED_PATCH | TRANSDERMAL | 0 refills | Status: DC

## 2018-03-31 NOTE — Telephone Encounter (Signed)
Patient called stating to cancel his procedure/pre-op. He is not wanting to have it done at this time. Reports he will call back when he is ready. FYI to LSL. Carolyn in endo is aware to cancel.

## 2018-03-31 NOTE — Telephone Encounter (Signed)
Noted  

## 2018-04-06 ENCOUNTER — Encounter (HOSPITAL_COMMUNITY): Payer: Self-pay | Admitting: Hematology

## 2018-04-06 ENCOUNTER — Other Ambulatory Visit: Payer: Self-pay

## 2018-04-06 ENCOUNTER — Inpatient Hospital Stay (HOSPITAL_COMMUNITY): Payer: BLUE CROSS/BLUE SHIELD | Attending: Hematology and Oncology | Admitting: Hematology

## 2018-04-06 VITALS — BP 119/84 | HR 88 | Temp 98.0°F | Resp 16 | Wt 228.1 lb

## 2018-04-06 DIAGNOSIS — F17210 Nicotine dependence, cigarettes, uncomplicated: Secondary | ICD-10-CM

## 2018-04-06 DIAGNOSIS — C61 Malignant neoplasm of prostate: Secondary | ICD-10-CM

## 2018-04-06 DIAGNOSIS — C7951 Secondary malignant neoplasm of bone: Secondary | ICD-10-CM | POA: Insufficient documentation

## 2018-04-06 DIAGNOSIS — J449 Chronic obstructive pulmonary disease, unspecified: Secondary | ICD-10-CM

## 2018-04-06 DIAGNOSIS — G4700 Insomnia, unspecified: Secondary | ICD-10-CM | POA: Insufficient documentation

## 2018-04-06 NOTE — Assessment & Plan Note (Signed)
1.  Metastatic prostate cancer to the bones: - Status post chemotherapy with docetaxel from 01/07/2016 through 04/11/2016, likely in the castration sensitive setting. -Recent rising PSA levels, patient maintained on [name], last injection 22.5 mg on 10/02/2017 -Most recent PSA of 7, CT scan (11/17/2017) and bone scan (11/13/2017) showing slight worsening of bone metastasis, no evidence of visceral metastasis - Abiraterone 1000mg  daily with prednisone 5 mg twice a day started on 12/02/2017.  He complained of worsening of pains in his lower back and spine region since the start of Abiraterone. - Zytiga was cut back to 750 mg on 01/29/2018.  PSA on 03/04/2018 went up to 2.48. -Zytiga was increased to thousand milligrams daily on 03/04/2018.  He has tolerated it reasonably well.  Today his PSA is 2.45. -His last [name] was on 03/26/2018.  Last [name] was on 03/26/2018. -I will see him back in 2 months with repeat PSA and other blood work.  2.  Prostate cancer related bone pain: He is on fentanyl 75 mcg patch daily.  He also takes hydrocodone 5 mg 1 to 2 tablets every 4 hours as needed for breakthrough pain.  Past 2 days have been good days.  He has not needed any hydrocodone.   3.  Bone metastasis: He will continue Xgeva monthly.  He takes calcium 2 pills in the morning.  4.  Difficulty sleeping: He is continuing temazepam 15 mg at bedtime as needed.

## 2018-04-06 NOTE — Progress Notes (Signed)
[place], [place] [address]   CLINIC:  Medical Oncology/Hematology  PCP:  Matthew [name], [place] [address] [phone]   REASON FOR VISIT:  Follow-up for prostate cancer  CURRENT THERAPY: Zytiga and prednisone  BRIEF ONCOLOGIC HISTORY:    Prostate carcinoma ([facility])   12/10/2015 Imaging    CTA chest, multiple thoracic and rib osseous lesions. no primary evident. Atherosclerosis including aortic and CAD    12/12/2015 Imaging    CT abdomen/pelvis with sclerotic osseous lesions througout the lumbar spine, sacrum, bilateral iliac bones and L proximal femur worrisome for sclerotic osseous mets,mild prostatomegaly, no LAD, Infrarenal 3.8 cm AAA    12/12/2015 Tumor Marker    PSA 153.85    12/18/2015 Imaging    Bone Scan Diffuse bony metastatic disease, posterior calvarium, sternum, thoracic, lumbar spine, bilateral ribs, L shoulder, bilateral bony pelvis, proximal L femur    12/24/2015 Initial Biopsy    CT biopsy of L iliac bone lesion    12/26/2015 Pathology Results    Bone, biopsy, left iliac - POSITIVE FOR METASTATIC ADENOCARCINOMA.    12/28/2015 - 09/01/2016 Chemotherapy    Firmagon instituted 240 mg     01/01/2016 Procedure    Port placed by IR.    01/07/2016 - 04/11/2016 Chemotherapy    Docetaxel every 21 days x 6 cycles with dose reductions due to tolerance    02/18/2016 Adverse Reaction    GI toxicity from docetaxel, dose reduced to 60 mg/m2    02/18/2016 Treatment Plan Change    Docetaxel dose reduced by 20%    03/06/2016 Procedure    Dr. Enrique Sack- Multiple extraction of tooth numbers 3, 5, 6, 8, 9, 10, 11, 12, 20, 21, 22, 27, 28, and 31. 4 Quadrants of alveoloplasty. Bilateral mandibular lingual tori reductions.    04/09/2016 Imaging    Brain MRI 1. No intracranial metastatic disease 2. No acute intracranial abnormality 3. Findings of chronic microvascular disease    05/14/2016 Imaging    CT C/A/P Stable  appearing diffuse bony metastatic disease. No new significant disease is seen Innumerable patchy sclerotic osseous metastases throughout the axial and proximal appendicular skeleton, stable in size and distribution, significantly increased in density in the interval, likely reflecting treatment effect. 2. No new or progressive metastatic disease in the chest, abdomen or pelvis. 3. Bilateral perifissural pulmonary nodules are stable and probably benign. 4. Aortic atherosclerosis. Infrarenal 4.3 cm abdominal aortic aneurysm, minimally increased in size. Recommend followup by ultrasound in 1 year.    06/02/2016 Tumor Marker    PSA 0.99 ng/ml     - 07/31/2016 Radiation Therapy    Palliative XRT by Dr. Lianne Cure in [place].     Chemotherapy    Depo-[name] every 3 months, beginning in Feb 2018       CANCER STAGING: Cancer Staging Prostate carcinoma [facility]) Staging form: Prostate, AJCC 7th Edition - Clinical stage from 12/24/2015: Stage IV (TX, N0, M1b, PSA: 20 or greater) - Signed by B[facility], PA-C on 01/07/2016    INTERVAL HISTORY:  Matthew [name] 62 y.o. male returns for routine follow-up for prostate cancer. Patient is here today with minor aches and pains due to the Zytiga. Patient states he has good days and bad days. On his bad days he needs more pain medication but on his good days he doesn't take any at all. Patient is SOB on exertion due to his COPD. He also has occasional dizziness, Patient denies any  headaches or vision changes. Denies any nausea, vomiting, or diarrhea. Denies any problems with urination.     REVIEW OF SYSTEMS:  Review of Systems  Respiratory:       SOB due to COPD  Musculoskeletal: Positive for back pain.  Neurological: Positive for dizziness.  All other systems reviewed and are negative.    PAST MEDICAL/SURGICAL HISTORY:  Past Medical History:  Diagnosis Date  . Arthritis    Left knee  . Bone cancer ([facility])   . Bone metastases ([facility]) 12/26/2015  .  Cancer [facility])    Prostate  . DVT (deep venous thrombosis) ([facility]) 2004  . Kidney stones    ~2002  . Prostate carcinoma ([facility]) 12/26/2015   Past Surgical History:  Procedure Laterality Date  . CIRCUMCISION  30 years ago  . MULTIPLE EXTRACTIONS WITH ALVEOLOPLASTY N/A 03/06/2016   Procedure: Extraction of tooth #'s 3,5,6,8-12,20-22, 27, 28, and 31 with alveoloplasty and bilateral mandibular tori reductions;  Surgeon: Lenn Cal, DDS;  Location: [facility];  Service: Oral Surgery;  Laterality: N/A;     SOCIAL HISTORY:  Social History   Socioeconomic History  . Marital status: Single    Spouse name: Not on file  . Number of children: 2  . Years of education: Not on file  . Highest education level: Not on file  Occupational History  . Occupation: Research scientist (life sciences)  Social Needs  . Financial resource strain: Not on file  . Food insecurity:    Worry: Not on file    Inability: Not on file  . Transportation needs:    Medical: Not on file    Non-medical: Not on file  Tobacco Use  . Smoking status: Current Every Day Smoker    Packs/day: 1.00    Years: 50.00    Pack years: 50.00    Types: Cigarettes    Start date: 06/16/1965  . Smokeless tobacco: Former Systems developer    Types: [place] date: 08/04/2005  Substance and Sexual Activity  . Alcohol use: No    Alcohol/week: 0.0 standard drinks  . Drug use: Yes    Types: Marijuana    Comment: THC for appetite; occ  . Sexual activity: Not on file  Lifestyle  . Physical activity:    Days per week: Not on file    Minutes per session: Not on file  . Stress: Not on file  Relationships  . Social connections:    Talks on phone: Not on file    Gets together: Not on file    Attends religious service: Not on file    Active member of club or organization: Not on file    Attends meetings of clubs or organizations: Not on file    Relationship status: Not on file  . Intimate partner violence:    Fear of current or ex partner: Not on file     Emotionally abused: Not on file    Physically abused: Not on file    Forced sexual activity: Not on file  Other Topics Concern  . Not on file  Social History Narrative  . Not on file    FAMILY HISTORY:  Family History  Problem Relation Age of Onset  . Cancer Mother     CURRENT MEDICATIONS:  Outpatient Encounter Medications as of 04/06/2018  Medication Sig  . abiraterone acetate (ZYTIGA) 250 MG tablet Take 4 tablets (1,000 mg total) by mouth daily. Take on an empty stomach 1 hour before or 2 hours after a meal  .  Calcium Carb-Cholecalciferol (CALCIUM 500 + D3) 500-600 MG-UNIT TABS Take 2 tablets by mouth daily.  . ergocalciferol (VITAMIN D2) 50000 units capsule Take 1 capsule (50,000 Units total) by mouth every Friday.  . fentaNYL (DURAGESIC - DOSED MCG/HR) 75 MCG/HR Place 1 patch (75 mcg total) onto the skin every 3 (three) days.  Marland Kitchen HYDROcodone-acetaminophen (NORCO) 10-325 MG tablet Take 1 tablet by mouth every 4 (four) hours as needed.  . lidocaine-prilocaine (EMLA) cream Apply a quarter size amount to port site 1 hour prior to chemo. Do not rub in. Cover with plastic wrap.  . lubiprostone (AMITIZA) 24 MCG capsule Take 1 capsule (24 mcg total) by mouth 2 (two) times daily with a meal.  . omeprazole (PRILOSEC) 20 MG capsule Take 1 capsule (20 mg total) by mouth daily. (Patient taking differently: Take 20 mg by mouth as needed. )  . predniSONE (DELTASONE) 5 MG tablet Take 1 tablet (5 mg total) by mouth 2 (two) times daily with a meal.  . prochlorperazine (COMPAZINE) 10 MG tablet Take 1 tablet (10 mg total) by mouth every 6 (six) hours as needed for nausea or vomiting.  . rosuvastatin (CRESTOR) 20 MG tablet TAKE ONE TABLET BY MOUTH ONCE DAILY  . temazepam (RESTORIL) 15 MG capsule Take 1 capsule (15 mg total) by mouth at bedtime as needed for sleep.  . Wheat Dextrin (BENEFIBER PO) Take by mouth as needed.   No facility-administered encounter medications on file as of 04/06/2018.      ALLERGIES:  Allergies  Allergen Reactions  . Meloxicam Other (See Comments)    Caused stomach bleeding.     PHYSICAL EXAM:  ECOG Performance status: 1  Vitals:   04/06/18 1535  BP: 119/84  Pulse: 88  Resp: 16  Temp: 98 F (36.7 C)  SpO2: 98%   Filed Weights   04/06/18 1535  Weight: 228 lb 1.6 oz (103.5 kg)    Physical Exam  Constitutional: He is oriented to person, place, and time. He appears well-developed and well-nourished.  Cardiovascular: Normal rate, regular rhythm and normal heart sounds.  Pulmonary/Chest: Effort normal and breath sounds normal.  Neurological: He is alert and oriented to person, place, and time.  Skin: Skin is warm and dry.  Psychiatric: He has a normal mood and affect. His behavior is normal. Judgment and thought content normal.  Abdomen: Soft nontender with no palpable abnormality. Extremities: No edema or cyanosis.  LABORATORY DATA:  I have reviewed the labs as listed.  CBC    Component Value Date/Time   WBC 6.0 03/26/2018 1207   RBC 4.55 03/26/2018 1207   HGB 14.0 03/26/2018 1207   HCT 42.0 03/26/2018 1207   PLT 165 03/26/2018 1207   MCV 92.3 03/26/2018 1207   MCH 30.8 03/26/2018 1207   MCHC 33.3 03/26/2018 1207   RDW 14.0 03/26/2018 1207   LYMPHSABS 1.4 03/26/2018 1207   MONOABS 0.4 03/26/2018 1207   EOSABS 0.1 03/26/2018 1207   BASOSABS 0.0 03/26/2018 1207   CMP Latest Ref Rng & Units 03/26/2018 03/01/2018 01/29/2018  Glucose 70 - 99 mg/dL 97 103(H) 116(H)  BUN 8 - 23 mg/dL 12 16 8   Creatinine 0.61 - 1.24 mg/dL 1.06 1.00 0.88  Sodium 135 - 145 mmol/L 141 138 141  Potassium 3.5 - 5.1 mmol/L 4.1 3.9 3.6  Chloride 98 - 111 mmol/L 106 107 108  CO2 22 - 32 mmol/L 26 23 23   Calcium 8.9 - 10.3 mg/dL 10.1 8.2(L) 8.5(L)  Total Protein 6.5 - 8.1  g/dL 6.9 6.8 6.7  Total Bilirubin 0.3 - 1.2 mg/dL 0.6 0.7 0.7  Alkaline Phos 38 - 126 U/L 62 72 72  AST 15 - 41 U/L 13(L) 12(L) 15  ALT 0 - 44 U/L 12 12 11        ASSESSMENT &  PLAN:   Prostate carcinoma ([facility]) 1.  Metastatic prostate cancer to the bones: - Status post chemotherapy with docetaxel from 01/07/2016 through 04/11/2016, likely in the castration sensitive setting. -Recent rising PSA levels, patient maintained on [name], last injection 22.5 mg on 10/02/2017 -Most recent PSA of 7, CT scan (11/17/2017) and bone scan (11/13/2017) showing slight worsening of bone metastasis, no evidence of visceral metastasis - Abiraterone 1000mg  daily with prednisone 5 mg twice a day started on 12/02/2017.  He complained of worsening of pains in his lower back and spine region since the start of Abiraterone. - Zytiga was cut back to 750 mg on 01/29/2018.  PSA on 03/04/2018 went up to 2.48. -Zytiga was increased to thousand milligrams daily on 03/04/2018.  He has tolerated it reasonably well.  Today his PSA is 2.45. -His last [name] was on 03/26/2018.  Last [name] was on 03/26/2018. -I will see him back in 2 months with repeat PSA and other blood work.  2.  Prostate cancer related bone pain: He is on fentanyl 75 mcg patch daily.  He also takes hydrocodone 5 mg 1 to 2 tablets every 4 hours as needed for breakthrough pain.  Past 2 days have been good days.  He has not needed any hydrocodone.   3.  Bone metastasis: He will continue Xgeva monthly.  He takes calcium 2 pills in the morning.  4.  Difficulty sleeping: He is continuing temazepam 15 mg at bedtime as needed.      Orders placed this encounter:  Orders Placed This Encounter  Procedures  . CBC with Differential/Platelet  . Comprehensive metabolic panel  . PSA      Derek Jack, MD [place] [phone]

## 2018-04-06 NOTE — Patient Instructions (Signed)
[facility] at [facility] Discharge Instructions  Follow up in 2 months with labs   Thank you for choosing [facility] at [facility] to provide your oncology and hematology care.  To afford each patient quality time with our provider, please arrive at least 15 minutes before your scheduled appointment time.   If you have a lab appointment with the Cancer Center please come in thru the  Main Entrance and check in at the main information desk  You need to re-schedule your appointment should you arrive 10 or more minutes late.  We strive to give you quality time with our providers, and arriving late affects you and other patients whose appointments are after yours.  Also, if you no show three or more times for appointments you may be dismissed from the clinic at the providers discretion.     Again, thank you for choosing [facility].  Our hope is that these requests will decrease the amount of time that you wait before being seen by our physicians.       _____________________________________________________________  Should you have questions after your visit to [facility], please contact our office at [phone] between the hours of 8:00 a.m. and 4:30 p.m.  Voicemails left after 4:00 p.m. will not be returned until the following business day.  For prescription refill requests, have your pharmacy contact our office and allow 72 hours.    Cancer Center Support Programs:   > Cancer Support Group  2nd Tuesday of the month 1pm-2pm, Journey Room    

## 2018-04-12 ENCOUNTER — Other Ambulatory Visit (HOSPITAL_COMMUNITY): Payer: Self-pay

## 2018-04-19 ENCOUNTER — Ambulatory Visit (HOSPITAL_COMMUNITY): Admit: 2018-04-19 | Payer: BLUE CROSS/BLUE SHIELD | Admitting: Internal Medicine

## 2018-04-19 ENCOUNTER — Encounter (HOSPITAL_COMMUNITY): Payer: Self-pay

## 2018-04-19 SURGERY — COLONOSCOPY WITH PROPOFOL
Anesthesia: Monitor Anesthesia Care

## 2018-04-21 ENCOUNTER — Other Ambulatory Visit (HOSPITAL_COMMUNITY): Payer: Self-pay | Admitting: *Deleted

## 2018-04-21 ENCOUNTER — Other Ambulatory Visit (HOSPITAL_COMMUNITY): Payer: Self-pay

## 2018-04-21 DIAGNOSIS — C61 Malignant neoplasm of prostate: Secondary | ICD-10-CM

## 2018-04-21 DIAGNOSIS — C7951 Secondary malignant neoplasm of bone: Secondary | ICD-10-CM

## 2018-04-21 MED ORDER — ERGOCALCIFEROL 1.25 MG (50000 UT) PO CAPS: 50000.0000 [IU] | ORAL_CAPSULE | ORAL | 4 refills | Status: DC

## 2018-04-23 ENCOUNTER — Inpatient Hospital Stay (HOSPITAL_COMMUNITY): Payer: BLUE CROSS/BLUE SHIELD

## 2018-04-23 ENCOUNTER — Encounter (HOSPITAL_COMMUNITY): Payer: Self-pay

## 2018-04-23 VITALS — BP 123/70 | HR 74 | Temp 97.6°F | Resp 18 | Wt 230.6 lb

## 2018-04-23 DIAGNOSIS — C61 Malignant neoplasm of prostate: Secondary | ICD-10-CM

## 2018-04-23 DIAGNOSIS — C7951 Secondary malignant neoplasm of bone: Secondary | ICD-10-CM | POA: Diagnosis not present

## 2018-04-23 LAB — COMPREHENSIVE METABOLIC PANEL
ALT: 11 U/L (ref 0–44)
AST: 16 U/L (ref 15–41)
Albumin: 3.9 g/dL (ref 3.5–5.0)
Alkaline Phosphatase: 68 U/L (ref 38–126)
Anion gap: 8 (ref 5–15)
BILIRUBIN TOTAL: 0.6 mg/dL (ref 0.3–1.2)
BUN: 10 mg/dL (ref 8–23)
CHLORIDE: 106 mmol/L (ref 98–111)
CO2: 25 mmol/L (ref 22–32)
Calcium: 8.8 mg/dL — ABNORMAL LOW (ref 8.9–10.3)
Creatinine, Ser: 1.01 mg/dL (ref 0.61–1.24)
Glucose, Bld: 153 mg/dL — ABNORMAL HIGH (ref 70–99)
POTASSIUM: 4.1 mmol/L (ref 3.5–5.1)
Sodium: 139 mmol/L (ref 135–145)
Total Protein: 7 g/dL (ref 6.5–8.1)

## 2018-04-23 MED ORDER — DENOSUMAB 120 MG/1.7ML ~~LOC~~ SOLN
120.0000 mg | Freq: Once | SUBCUTANEOUS | Status: AC
  Administered 2018-04-23: 120 mg via SUBCUTANEOUS
  Filled 2018-04-23: qty 1.7

## 2018-04-23 NOTE — Progress Notes (Signed)
Patient taking calcium as directed.  Denied tooth, jaw, or leg pain.  Tolerated injection with no complaints voiced.  Site clean and dry.  Band aid applied. VSS with discharge and left ambulatory with no s/s of distress noted.

## 2018-04-23 NOTE — Patient Instructions (Signed)
[facility] at [facility]  Discharge Instructions:  You received xgeva today. _______________________________________________________________  Thank you for choosing [facility] at [facility] to provide your oncology and hematology care.  To afford each patient quality time with our providers, please arrive at least 15 minutes before your scheduled appointment.  You need to re-schedule your appointment if you arrive 10 or more minutes late.  We strive to give you quality time with our providers, and arriving late affects you and other patients whose appointments are after yours.  Also, if you no show three or more times for appointments you may be dismissed from the clinic.  Again, thank you for choosing [facility] at [facility]. Our hope is that these requests will allow you access to exceptional care and in a timely manner. _______________________________________________________________  If you have questions after your visit, please contact our office at [phone] between the hours of 8:30 a.m. and 5:00 p.m. Voicemails left after 4:30 p.m. will not be returned until the following business day. _______________________________________________________________  For prescription refill requests, have your pharmacy contact our office. _______________________________________________________________  Recommendations made by the consultant and any test results will be sent to your referring physician. _______________________________________________________________ 

## 2018-04-27 ENCOUNTER — Other Ambulatory Visit (HOSPITAL_COMMUNITY): Payer: Self-pay | Admitting: *Deleted

## 2018-04-27 DIAGNOSIS — C61 Malignant neoplasm of prostate: Secondary | ICD-10-CM

## 2018-04-27 MED ORDER — ABIRATERONE ACETATE 250 MG PO TABS: 1000.0000 mg | ORAL_TABLET | Freq: Every day | ORAL | 0 refills | Status: DC

## 2018-04-27 NOTE — Telephone Encounter (Signed)
Chart reviewed, Zytiga refilled. 

## 2018-05-05 ENCOUNTER — Other Ambulatory Visit (HOSPITAL_COMMUNITY): Payer: Self-pay | Admitting: Nurse Practitioner

## 2018-05-05 DIAGNOSIS — C7951 Secondary malignant neoplasm of bone: Secondary | ICD-10-CM

## 2018-05-05 DIAGNOSIS — C61 Malignant neoplasm of prostate: Secondary | ICD-10-CM

## 2018-05-05 MED ORDER — HYDROCODONE-ACETAMINOPHEN 10-325 MG PO TABS: 1.0000 | ORAL_TABLET | ORAL | 0 refills | Status: DC | PRN

## 2018-05-21 ENCOUNTER — Encounter (HOSPITAL_COMMUNITY): Payer: Self-pay

## 2018-05-21 ENCOUNTER — Inpatient Hospital Stay (HOSPITAL_COMMUNITY): Payer: BLUE CROSS/BLUE SHIELD | Attending: Hematology

## 2018-05-21 ENCOUNTER — Inpatient Hospital Stay (HOSPITAL_COMMUNITY): Payer: BLUE CROSS/BLUE SHIELD

## 2018-05-21 ENCOUNTER — Other Ambulatory Visit: Payer: Self-pay

## 2018-05-21 VITALS — BP 127/84 | HR 84 | Temp 97.8°F | Resp 18

## 2018-05-21 DIAGNOSIS — C7951 Secondary malignant neoplasm of bone: Secondary | ICD-10-CM | POA: Diagnosis present

## 2018-05-21 DIAGNOSIS — C61 Malignant neoplasm of prostate: Secondary | ICD-10-CM

## 2018-05-21 LAB — COMPREHENSIVE METABOLIC PANEL
ALBUMIN: 3.7 g/dL (ref 3.5–5.0)
ALT: 12 U/L (ref 0–44)
AST: 16 U/L (ref 15–41)
Alkaline Phosphatase: 66 U/L (ref 38–126)
Anion gap: 7 (ref 5–15)
BUN: 13 mg/dL (ref 8–23)
CHLORIDE: 105 mmol/L (ref 98–111)
CO2: 24 mmol/L (ref 22–32)
CREATININE: 1 mg/dL (ref 0.61–1.24)
Calcium: 8.4 mg/dL — ABNORMAL LOW (ref 8.9–10.3)
GFR calc Af Amer: >60 mL/min (ref >60)
GLUCOSE: 165 mg/dL — AB (ref 70–99)
Potassium: 3.7 mmol/L (ref 3.5–5.1)
Sodium: 136 mmol/L (ref 135–145)
Total Bilirubin: 0.5 mg/dL (ref 0.3–1.2)
Total Protein: 6.4 g/dL — ABNORMAL LOW (ref 6.5–8.1)

## 2018-05-21 LAB — CBC WITH DIFFERENTIAL/PLATELET
ABS IMMATURE GRANULOCYTES: 0.02 10*3/uL (ref 0.00–0.07)
BASOS ABS: 0 10*3/uL (ref 0.0–0.1)
BASOS PCT: 0 %
Eosinophils Absolute: 0.1 10*3/uL (ref 0.0–0.5)
Eosinophils Relative: 3 %
HCT: 41.2 % (ref 39.0–52.0)
Hemoglobin: 13.2 g/dL (ref 13.0–17.0)
IMMATURE GRANULOCYTES: 0 %
LYMPHS ABS: 0.9 10*3/uL (ref 0.7–4.0)
Lymphocytes Relative: 19 %
MCH: 30.3 pg (ref 26.0–34.0)
MCHC: 32 g/dL (ref 30.0–36.0)
MCV: 94.5 fL (ref 80.0–100.0)
MONOS PCT: 7 %
Monocytes Absolute: 0.3 10*3/uL (ref 0.1–1.0)
NEUTROS ABS: 3.2 10*3/uL (ref 1.7–7.7)
Neutrophils Relative %: 71 %
PLATELETS: 184 10*3/uL (ref 150–400)
RBC: 4.36 MIL/uL (ref 4.22–5.81)
RDW: 13.6 % (ref 11.5–15.5)
WBC: 4.6 10*3/uL (ref 4.0–10.5)
nRBC: 0 % (ref 0.0–0.2)

## 2018-05-21 MED ORDER — DENOSUMAB 120 MG/1.7ML ~~LOC~~ SOLN
120.0000 mg | Freq: Once | SUBCUTANEOUS | Status: AC
  Administered 2018-05-21: 120 mg via SUBCUTANEOUS
  Filled 2018-05-21: qty 1.7

## 2018-05-21 MED ORDER — LEUPROLIDE ACETATE (3 MONTH) 22.5 MG IM KIT: 22.5000 mg | PACK | Freq: Once | INTRAMUSCULAR | Status: DC

## 2018-05-21 NOTE — Progress Notes (Signed)
Okay to administer Xgeva today per T. Kefalas, PA-C.   MARKEESE BOYAJIAN presents today for injection per the provider's orders.  Xgeva administration without incident; see MAR for injection details.  Patient tolerated procedure well and without incident.  No questions or complaints noted at this time.  Discharged ambulatory.

## 2018-05-27 ENCOUNTER — Other Ambulatory Visit (HOSPITAL_COMMUNITY): Payer: Self-pay | Admitting: *Deleted

## 2018-05-27 DIAGNOSIS — C61 Malignant neoplasm of prostate: Secondary | ICD-10-CM

## 2018-05-27 MED ORDER — ABIRATERONE ACETATE 250 MG PO TABS: 1000.0000 mg | ORAL_TABLET | Freq: Every day | ORAL | 0 refills | Status: DC

## 2018-05-27 NOTE — Telephone Encounter (Signed)
Chart reviewed, Zytiga refilled. 

## 2018-06-02 ENCOUNTER — Other Ambulatory Visit (HOSPITAL_COMMUNITY): Payer: Self-pay | Admitting: *Deleted

## 2018-06-02 DIAGNOSIS — C61 Malignant neoplasm of prostate: Secondary | ICD-10-CM

## 2018-06-02 DIAGNOSIS — C7951 Secondary malignant neoplasm of bone: Secondary | ICD-10-CM

## 2018-06-02 DIAGNOSIS — G893 Neoplasm related pain (acute) (chronic): Secondary | ICD-10-CM

## 2018-06-02 MED ORDER — FENTANYL 75 MCG/HR TD PT72: 75.0000 ug | MEDICATED_PATCH | TRANSDERMAL | 0 refills | Status: DC

## 2018-06-02 MED ORDER — HYDROCODONE-ACETAMINOPHEN 10-325 MG PO TABS: 1.0000 | ORAL_TABLET | ORAL | 0 refills | Status: DC | PRN

## 2018-06-14 ENCOUNTER — Ambulatory Visit (HOSPITAL_COMMUNITY): Payer: Self-pay | Admitting: Hematology

## 2018-06-21 ENCOUNTER — Inpatient Hospital Stay (HOSPITAL_COMMUNITY): Payer: Medicare Other | Attending: Hematology

## 2018-06-21 DIAGNOSIS — Z23 Encounter for immunization: Secondary | ICD-10-CM | POA: Insufficient documentation

## 2018-06-21 DIAGNOSIS — C7951 Secondary malignant neoplasm of bone: Secondary | ICD-10-CM | POA: Diagnosis not present

## 2018-06-21 DIAGNOSIS — Z5111 Encounter for antineoplastic chemotherapy: Secondary | ICD-10-CM | POA: Insufficient documentation

## 2018-06-21 DIAGNOSIS — C61 Malignant neoplasm of prostate: Secondary | ICD-10-CM

## 2018-06-21 LAB — COMPREHENSIVE METABOLIC PANEL
ALK PHOS: 63 U/L (ref 38–126)
ALT: 10 U/L (ref 0–44)
AST: 15 U/L (ref 15–41)
Albumin: 3.8 g/dL (ref 3.5–5.0)
Anion gap: 9 (ref 5–15)
BUN: 9 mg/dL (ref 8–23)
CALCIUM: 8.8 mg/dL — AB (ref 8.9–10.3)
CO2: 25 mmol/L (ref 22–32)
CREATININE: 1.07 mg/dL (ref 0.61–1.24)
Chloride: 104 mmol/L (ref 98–111)
GFR calc Af Amer: >60 mL/min (ref >60)
GFR calc non Af Amer: >60 mL/min (ref >60)
GLUCOSE: 115 mg/dL — AB (ref 70–99)
Potassium: 3.6 mmol/L (ref 3.5–5.1)
SODIUM: 138 mmol/L (ref 135–145)
Total Bilirubin: 0.6 mg/dL (ref 0.3–1.2)
Total Protein: 7 g/dL (ref 6.5–8.1)

## 2018-06-21 LAB — CBC WITH DIFFERENTIAL/PLATELET
ABS IMMATURE GRANULOCYTES: 0.01 10*3/uL (ref 0.00–0.07)
BASOS ABS: 0 10*3/uL (ref 0.0–0.1)
Basophils Relative: 0 %
EOS ABS: 0.2 10*3/uL (ref 0.0–0.5)
Eosinophils Relative: 4 %
HCT: 41.4 % (ref 39.0–52.0)
Hemoglobin: 13.5 g/dL (ref 13.0–17.0)
IMMATURE GRANULOCYTES: 0 %
Lymphocytes Relative: 20 %
Lymphs Abs: 0.9 10*3/uL (ref 0.7–4.0)
MCH: 30.5 pg (ref 26.0–34.0)
MCHC: 32.6 g/dL (ref 30.0–36.0)
MCV: 93.5 fL (ref 80.0–100.0)
MONOS PCT: 8 %
Monocytes Absolute: 0.4 10*3/uL (ref 0.1–1.0)
NEUTROS ABS: 3 10*3/uL (ref 1.7–7.7)
NEUTROS PCT: 68 %
Platelets: 187 10*3/uL (ref 150–400)
RBC: 4.43 MIL/uL (ref 4.22–5.81)
RDW: 13.1 % (ref 11.5–15.5)
WBC: 4.5 10*3/uL (ref 4.0–10.5)
nRBC: 0 % (ref 0.0–0.2)

## 2018-06-21 LAB — PSA: Prostatic Specific Antigen: 3.53 ng/mL (ref 0.00–4.00)

## 2018-06-24 ENCOUNTER — Other Ambulatory Visit (HOSPITAL_COMMUNITY): Payer: Self-pay | Admitting: Nurse Practitioner

## 2018-06-24 DIAGNOSIS — C61 Malignant neoplasm of prostate: Secondary | ICD-10-CM

## 2018-06-25 ENCOUNTER — Other Ambulatory Visit (HOSPITAL_COMMUNITY): Payer: Self-pay | Admitting: *Deleted

## 2018-06-25 DIAGNOSIS — C61 Malignant neoplasm of prostate: Secondary | ICD-10-CM

## 2018-06-25 MED ORDER — ABIRATERONE ACETATE 250 MG PO TABS: 1000.0000 mg | ORAL_TABLET | Freq: Every day | ORAL | 0 refills | Status: DC

## 2018-06-28 ENCOUNTER — Inpatient Hospital Stay (HOSPITAL_BASED_OUTPATIENT_CLINIC_OR_DEPARTMENT_OTHER): Payer: Medicare Other | Admitting: Hematology

## 2018-06-28 ENCOUNTER — Encounter (HOSPITAL_COMMUNITY): Payer: Self-pay | Admitting: Hematology

## 2018-06-28 ENCOUNTER — Other Ambulatory Visit: Payer: Self-pay

## 2018-06-28 ENCOUNTER — Ambulatory Visit (HOSPITAL_COMMUNITY): Payer: Self-pay

## 2018-06-28 ENCOUNTER — Inpatient Hospital Stay (HOSPITAL_COMMUNITY): Payer: Medicare Other

## 2018-06-28 VITALS — BP 120/78 | HR 89 | Temp 98.2°F | Resp 18 | Wt 235.1 lb

## 2018-06-28 DIAGNOSIS — Z5111 Encounter for antineoplastic chemotherapy: Secondary | ICD-10-CM | POA: Diagnosis not present

## 2018-06-28 DIAGNOSIS — C7951 Secondary malignant neoplasm of bone: Secondary | ICD-10-CM

## 2018-06-28 DIAGNOSIS — F17210 Nicotine dependence, cigarettes, uncomplicated: Secondary | ICD-10-CM

## 2018-06-28 DIAGNOSIS — Z809 Family history of malignant neoplasm, unspecified: Secondary | ICD-10-CM

## 2018-06-28 DIAGNOSIS — C61 Malignant neoplasm of prostate: Secondary | ICD-10-CM

## 2018-06-28 DIAGNOSIS — M25559 Pain in unspecified hip: Secondary | ICD-10-CM | POA: Diagnosis not present

## 2018-06-28 DIAGNOSIS — Z800 Family history of malignant neoplasm of digestive organs: Secondary | ICD-10-CM

## 2018-06-28 DIAGNOSIS — Z23 Encounter for immunization: Secondary | ICD-10-CM | POA: Diagnosis not present

## 2018-06-28 MED ORDER — INFLUENZA VAC SPLIT QUAD 0.5 ML IM SUSY
PREFILLED_SYRINGE | INTRAMUSCULAR | Status: AC
  Filled 2018-06-28: qty 0.5

## 2018-06-28 MED ORDER — LEUPROLIDE ACETATE (3 MONTH) 22.5 MG IM KIT
22.5000 mg | PACK | Freq: Once | INTRAMUSCULAR | Status: AC
  Administered 2018-06-28: 22.5 mg via INTRAMUSCULAR
  Filled 2018-06-28: qty 22.5

## 2018-06-28 MED ORDER — INFLUENZA VAC SPLIT QUAD 0.5 ML IM SUSY
0.5000 mL | PREFILLED_SYRINGE | Freq: Once | INTRAMUSCULAR | Status: AC
  Administered 2018-06-28: 0.5 mL via INTRAMUSCULAR

## 2018-06-28 MED ORDER — DENOSUMAB 120 MG/1.7ML ~~LOC~~ SOLN
120.0000 mg | Freq: Once | SUBCUTANEOUS | Status: AC
  Administered 2018-06-28: 120 mg via SUBCUTANEOUS
  Filled 2018-06-28: qty 1.7

## 2018-06-28 NOTE — Assessment & Plan Note (Addendum)
1.  Metastatic prostate cancer to the bones: - Status post chemotherapy with docetaxel from 01/07/2016 through 04/11/2016, likely in the castration sensitive setting. -Recent rising PSA levels, patient maintained on [name], last injection 22.5 mg on 10/02/2017 -Most recent PSA of 7, CT scan (11/17/2017) and bone scan (11/13/2017) showing slight worsening of bone metastasis, no evidence of visceral metastasis - Abiraterone thousand milligrams daily with prednisone 5 mg twice daily started on 12/02/2017.  He had some mild worsening of back pain since Abiraterone. - Abiraterone was cut back to 750 mg on 01/29/2018.  PSA on 03/04/2018 went up to 2.48.  -Abiraterone was increased back to thousand milligrams daily on 03/04/2018.his PSA was 2.45. - I discussed the results of the blood work today.  His PSA went up to 3.5.  Rest of the blood count was normal.  I would not make any changes at this time.  We will reevaluate him in 8 weeks.  I plan to repeat bone scan and CT scan prior to next visit. -He will proceed with [name] and denosumab.  He will also receive flu shot.  2.  Prostate cancer related bone pain: -He has pain in the mid back region and left hip. -Uses fentanyl 75 mcg patch.  He takes hydrocodone 5 mg 1 to 2 tablets every 4 hours as needed for breakthrough pain.  He did not need any breakthrough medication in the last 2 days.  3.  Bone metastasis: His calcium is within normal limits.  He takes 2 calcium pills in the morning.  He will continue Xgeva monthly.  4.  Family history: - Maternal grandmother had stomach cancer.  Sister had stomach cancer.  Brother had some type of cancer. -We will make a referral to our geneticist Santiago Glad for hereditary mutation testing.  If he has any mismatch repair gene mutations, he will be eligible for PARP inhibitors.

## 2018-06-28 NOTE — Patient Instructions (Signed)
[facility] at [facility] Discharge Instructions     Thank you for choosing [facility] at [facility] to provide your oncology and hematology care.  To afford each patient quality time with our provider, please arrive at least 15 minutes before your scheduled appointment time.   If you have a lab appointment with the Cancer Center please come in thru the  Main Entrance and check in at the main information desk  You need to re-schedule your appointment should you arrive 10 or more minutes late.  We strive to give you quality time with our providers, and arriving late affects you and other patients whose appointments are after yours.  Also, if you no show three or more times for appointments you may be dismissed from the clinic at the providers discretion.     Again, thank you for choosing [facility].  Our hope is that these requests will decrease the amount of time that you wait before being seen by our physicians.       _____________________________________________________________  Should you have questions after your visit to [facility], please contact our office at [phone] between the hours of 8:00 a.m. and 4:30 p.m.  Voicemails left after 4:00 p.m. will not be returned until the following business day.  For prescription refill requests, have your pharmacy contact our office and allow 72 hours.    Cancer Center Support Programs:   > Cancer Support Group  2nd Tuesday of the month 1pm-2pm, Journey Room    

## 2018-06-28 NOTE — Progress Notes (Signed)
[place], Matthew [name] [id number]   CLINIC:  Medical Oncology/Hematology  PCP:  Matthew [name], [place] [address] [phone]   REASON FOR VISIT: Follow-up for prostate [name]  CURRENT THERAPY: Zytiga and prednisone   BRIEF ONCOLOGIC HISTORY:    Prostate carcinoma ([place])   12/10/2015 Imaging    CTA chest, multiple thoracic and rib osseous lesions. no primary evident. Atherosclerosis including aortic and CAD    12/12/2015 Imaging    CT abdomen/pelvis with sclerotic osseous lesions througout the lumbar spine, sacrum, bilateral iliac bones and L proximal femur worrisome for sclerotic osseous mets,mild prostatomegaly, no LAD, Infrarenal 3.8 cm AAA    12/12/2015 Tumor Marker    PSA 153.85    12/18/2015 Imaging    Bone Scan Diffuse bony metastatic disease, posterior calvarium, sternum, thoracic, lumbar spine, bilateral ribs, L shoulder, bilateral bony pelvis, proximal L femur    12/24/2015 Initial Biopsy    CT biopsy of L iliac bone lesion    12/26/2015 Pathology Results    Bone, biopsy, left iliac - POSITIVE FOR METASTATIC ADENOCARCINOMA.    12/28/2015 - 09/01/2016 Chemotherapy    Firmagon instituted 240 mg     01/01/2016 Procedure    Port placed by IR.    01/07/2016 - 04/11/2016 Chemotherapy    Docetaxel every 21 days x 6 cycles with dose reductions due to tolerance    02/18/2016 Adverse Reaction    GI toxicity from docetaxel, dose reduced to 60 mg/m2    02/18/2016 Treatment Plan Change    Docetaxel dose reduced by 20%    03/06/2016 Procedure    Dr. Enrique [name]- Multiple extraction of tooth numbers 3, 5, 6, 8, 9, 10, 11, 12, 20, 21, 22, 27, 28, and 31. 4 Quadrants of alveoloplasty. Bilateral mandibular lingual tori reductions.    04/09/2016 Imaging    Brain MRI 1. No intracranial metastatic disease 2. No acute intracranial abnormality 3. Findings of chronic microvascular disease    05/14/2016 Imaging    CT C/A/P Stable  appearing diffuse bony metastatic disease. No new significant disease is seen Innumerable patchy sclerotic osseous metastases throughout the axial and proximal appendicular skeleton, stable in size and distribution, significantly increased in density in the interval, likely reflecting treatment effect. 2. No new or progressive metastatic disease in the chest, abdomen or pelvis. 3. Bilateral perifissural pulmonary nodules are stable and probably benign. 4. Aortic atherosclerosis. Infrarenal 4.3 cm abdominal aortic aneurysm, minimally increased in size. Recommend followup by ultrasound in 1 year.    06/02/2016 Tumor Marker    PSA 0.99 ng/ml     - 07/31/2016 Radiation Therapy    Palliative XRT by Dr. Lianne [name] in [place].     Chemotherapy    Depo-[name] every 3 months, beginning in Feb 2018       [name] STAGING: [name] Staging Prostate carcinoma [facility]) Staging form: Prostate, AJCC 7th Edition - Clinical stage from 12/24/2015: Stage IV (TX, N0, M1b, PSA: 20 or greater) - Signed by Matthew Cancer, PA-C on 01/07/2016    INTERVAL HISTORY:  Matthew [name] 62 y.o. male returns for routine follow-up for prostate [name]. He reports having pain in his hip and side. He is controling it well with his patch and medications. He denies any fevers or recent infections. Denies any nausea, vomiting, or diarrhea. Denies any bleeding or easy bruising. He reports his appetite and energy level at 75%. He remains active at home and performs all his own ADLs.  REVIEW OF SYSTEMS:  Review of Systems  Respiratory: Positive for shortness of breath.   Psychiatric/Behavioral: Positive for sleep disturbance.  All other systems reviewed and are negative.    PAST MEDICAL/SURGICAL HISTORY:  Past Medical History:  Diagnosis Date  . Arthritis    Left knee  . Bone [name] ([place])   . Bone metastases ([place]) 12/26/2015  . [name] [facility])    Prostate  . DVT (deep venous thrombosis) ([place]) 2004  . Kidney stones    ~2002    . Prostate carcinoma ([place]) 12/26/2015   Past Surgical History:  Procedure Laterality Date  . CIRCUMCISION  30 years ago  . MULTIPLE EXTRACTIONS WITH ALVEOLOPLASTY N/A 03/06/2016   Procedure: Extraction of tooth #'s 3,5,6,8-12,20-22, 27, 28, and 31 with alveoloplasty and bilateral mandibular tori reductions;  Surgeon: Lenn Cal, DDS;  Location: [facility];  Service: Oral Surgery;  Laterality: N/A;     SOCIAL HISTORY:  Social History   Socioeconomic History  . Marital status: Single    Spouse name: Not on file  . Number of children: 2  . Years of education: Not on file  . Highest education level: Not on file  Occupational History  . Occupation: Research scientist (life sciences)  Social Needs  . Financial resource strain: Not on file  . Food insecurity:    Worry: Not on file    Inability: Not on file  . Transportation needs:    Medical: Not on file    Non-medical: Not on file  Tobacco Use  . Smoking status: Current Every [name] Smoker    Packs/[name]: 1.00    Years: 50.00    Pack years: 50.00    Types: Cigarettes    Start date: 06/16/1965  . Smokeless tobacco: Former Systems developer    Types: [place] date: 08/04/2005  Substance and Sexual Activity  . Alcohol use: No    Alcohol/week: 0.0 standard drinks  . Drug use: Yes    Types: Marijuana    Comment: THC for appetite; occ  . Sexual activity: Not on file  Lifestyle  . Physical activity:    Days per week: Not on file    Minutes per session: Not on file  . Stress: Not on file  Relationships  . Social connections:    Talks on phone: Not on file    Gets together: Not on file    Attends religious service: Not on file    Active member of club or organization: Not on file    Attends meetings of clubs or organizations: Not on file    Relationship status: Not on file  . Intimate partner violence:    Fear of current or ex partner: Not on file    Emotionally abused: Not on file    Physically abused: Not on file    Forced sexual activity: Not  on file  Other Topics Concern  . Not on file  Social History Narrative  . Not on file    FAMILY HISTORY:  Family History  Problem Relation Age of Onset  . [name] Mother     CURRENT MEDICATIONS:  Outpatient Encounter Medications as of 06/28/2018  Medication Sig  . abiraterone acetate (ZYTIGA) 250 MG tablet Take 4 tablets (1,000 mg total) by mouth daily. Take on an empty stomach 1 hour before or 2 hours after a meal  . Calcium Carb-Cholecalciferol (CALCIUM 500 + D3) 500-600 MG-UNIT TABS Take 2 tablets by mouth daily.  . ergocalciferol (VITAMIN D2) 50000 units capsule Take 1  capsule (50,000 Units total) by mouth every Friday.  . fentaNYL (DURAGESIC - DOSED MCG/HR) 75 MCG/HR Place 1 patch (75 mcg total) onto the skin every 3 (three) days.  Marland Kitchen HYDROcodone-acetaminophen (NORCO) 10-325 MG tablet Take 1 tablet by mouth every 4 (four) hours as needed.  . lidocaine-prilocaine (EMLA) cream Apply a quarter size amount to port site 1 hour prior to chemo. Do not rub in. Cover with plastic wrap.  . lubiprostone (AMITIZA) 24 MCG capsule Take 1 capsule (24 mcg total) by mouth 2 (two) times daily with a meal.  . omeprazole (PRILOSEC) 20 MG capsule Take 1 capsule (20 mg total) by mouth daily. (Patient taking differently: Take 20 mg by mouth as needed. )  . predniSONE (DELTASONE) 5 MG tablet TAKE 1 TABLET BY MOUTH TWICE DAILY WITH A MEAL  . prochlorperazine (COMPAZINE) 10 MG tablet Take 1 tablet (10 mg total) by mouth every 6 (six) hours as needed for nausea or vomiting.  . rosuvastatin (CRESTOR) 20 MG tablet TAKE ONE TABLET BY MOUTH ONCE DAILY  . temazepam (RESTORIL) 15 MG capsule Take 1 capsule (15 mg total) by mouth at bedtime as needed for sleep.  . Wheat Dextrin (BENEFIBER PO) Take by mouth as needed.   No facility-administered encounter medications on file as of 06/28/2018.     ALLERGIES:  Allergies  Allergen Reactions  . Meloxicam Other (See Comments)    Caused stomach bleeding.      PHYSICAL EXAM:  ECOG Performance status: 1  Vitals:   06/28/18 1430  BP: 120/78  Pulse: 89  Resp: 18  Temp: 98.2 F (36.8 C)  SpO2: 98%   Filed Weights   06/28/18 1430  Weight: 235 lb 1.6 oz (106.6 kg)    Physical Exam  Constitutional: He is oriented to person, place, and time. He appears well-developed and well-nourished.  Musculoskeletal: Normal range of motion.  Neurological: He is alert and oriented to person, place, and time.  Skin: Skin is warm and dry.  Psychiatric: He has a normal mood and affect. His behavior is normal. Judgment and thought content normal.     LABORATORY DATA:  I have reviewed the labs as listed.  CBC    Component Value Date/Time   WBC 4.5 06/21/2018 1215   RBC 4.43 06/21/2018 1215   HGB 13.5 06/21/2018 1215   HCT 41.4 06/21/2018 1215   PLT 187 06/21/2018 1215   MCV 93.5 06/21/2018 1215   MCH 30.5 06/21/2018 1215   MCHC 32.6 06/21/2018 1215   RDW 13.1 06/21/2018 1215   LYMPHSABS 0.9 06/21/2018 1215   MONOABS 0.4 06/21/2018 1215   EOSABS 0.2 06/21/2018 1215   BASOSABS 0.0 06/21/2018 1215   CMP Latest Ref Rng & Units 06/21/2018 05/21/2018 04/23/2018  Glucose 70 - 99 mg/dL 115(H) 165(H) 153(H)  BUN 8 - 23 mg/dL '9 13 10  ' Creatinine 0.61 - 1.24 mg/dL 1.07 1.00 1.01  Sodium 135 - 145 mmol/L 138 136 139  Potassium 3.5 - 5.1 mmol/L 3.6 3.7 4.1  Chloride 98 - 111 mmol/L 104 105 106  CO2 22 - 32 mmol/L '25 24 25  ' Calcium 8.9 - 10.3 mg/dL 8.8(L) 8.4(L) 8.8(L)  Total Protein 6.5 - 8.1 g/dL 7.0 6.4(L) 7.0  Total Bilirubin 0.3 - 1.2 mg/dL 0.6 0.5 0.6  Alkaline Phos 38 - 126 U/L 63 66 68  AST 15 - 41 U/L '15 16 16  ' ALT 0 - 44 U/L '10 12 11         ' ASSESSMENT &  PLAN:   Prostate carcinoma ([place]) 1.  Metastatic prostate [name] to the bones: - Status post chemotherapy with docetaxel from 01/07/2016 through 04/11/2016, likely in the castration sensitive setting. -Recent rising PSA levels, patient maintained on [name], last injection 22.5 mg  on 10/02/2017 -Most recent PSA of 7, CT scan (11/17/2017) and bone scan (11/13/2017) showing slight worsening of bone metastasis, no evidence of visceral metastasis - Abiraterone thousand milligrams daily with prednisone 5 mg twice daily started on 12/02/2017.  He had some mild worsening of back pain since Abiraterone. - Abiraterone was cut back to 750 mg on 01/29/2018.  PSA on 03/04/2018 went up to 2.48.  -Abiraterone was increased back to thousand milligrams daily on 03/04/2018.his PSA was 2.45. - I discussed the results of the blood work today.  His PSA went up to 3.5.  Rest of the blood count was normal.  I would not make any changes at this time.  We will reevaluate him in 8 weeks.  I plan to repeat bone scan and CT scan prior to next visit. -He will proceed with [name] and denosumab.  He will also receive flu shot.  2.  Prostate [name] related bone pain: -He has pain in the mid back region and left hip. -Uses fentanyl 75 mcg patch.  He takes hydrocodone 5 mg 1 to 2 tablets every 4 hours as needed for breakthrough pain.  He did not need any breakthrough medication in the last 2 days.  3.  Bone metastasis: His calcium is within normal limits.  He takes 2 calcium pills in the morning.  He will continue Xgeva monthly.  4.  Family history: - Maternal grandmother had stomach [name].  Sister had stomach [name].  Brother had some type of [name]. -We will make a referral to our geneticist Santiago Glad for hereditary mutation testing.  If he has any mismatch repair gene mutations, he will be eligible for PARP inhibitors.      Orders placed this encounter:  Orders Placed This Encounter  Procedures  . PSA  . CBC with Differential/Platelet  . Comprehensive metabolic panel      Derek Jack, MD [place] [phone]

## 2018-06-28 NOTE — Progress Notes (Signed)
Pt seen by Dr. Delton Coombes. Per MD proceed with [name] and [name]. VO to give pt a flu shot today with injections. VSS.   Xgeva, [name], and Flu shot administered to patient. No adverse affects. Pt ambulated off the unit.   Discharged from clinic ambulatory. F/U with [facility] as scheduled.

## 2018-06-28 NOTE — Patient Instructions (Signed)
[facility]  Discharge Instructions:   _______________________________________________________________  Thank you for choosing [facility] at [facility] to provide your oncology and hematology care.  To afford each patient quality time with our providers, please arrive at least 15 minutes before your scheduled appointment.  You need to re-schedule your appointment if you arrive 10 or more minutes late.  We strive to give you quality time with our providers, and arriving late affects you and other patients whose appointments are after yours.  Also, if you no show three or more times for appointments you may be dismissed from the clinic.  Again, thank you for choosing [facility] at [facility]. Our hope is that these requests will allow you access to exceptional care and in a timely manner. _______________________________________________________________  If you have questions after your visit, please contact our office at [phone] between the hours of 8:30 a.m. and 5:00 p.m. Voicemails left after 4:30 p.m. will not be returned until the following business day. _______________________________________________________________  For prescription refill requests, have your pharmacy contact our office. _______________________________________________________________  Recommendations made by the consultant and any test results will be sent to your referring physician. _______________________________________________________________ 

## 2018-07-06 ENCOUNTER — Telehealth (HOSPITAL_COMMUNITY): Payer: Self-pay | Admitting: Surgery

## 2018-07-06 ENCOUNTER — Other Ambulatory Visit (HOSPITAL_COMMUNITY): Payer: Self-pay | Admitting: Nurse Practitioner

## 2018-07-06 DIAGNOSIS — G893 Neoplasm related pain (acute) (chronic): Secondary | ICD-10-CM

## 2018-07-06 DIAGNOSIS — C61 Malignant neoplasm of prostate: Secondary | ICD-10-CM

## 2018-07-06 DIAGNOSIS — C7951 Secondary malignant neoplasm of bone: Secondary | ICD-10-CM

## 2018-07-06 MED ORDER — FENTANYL 75 MCG/HR TD PT72: 75.0000 ug | MEDICATED_PATCH | TRANSDERMAL | 0 refills | Status: DC

## 2018-07-06 MED ORDER — HYDROCODONE-ACETAMINOPHEN 10-325 MG PO TABS: 1.0000 | ORAL_TABLET | ORAL | 0 refills | Status: DC | PRN

## 2018-07-06 NOTE — Telephone Encounter (Signed)
Pt had left voicemail requesting his pain medication to be refilled.  Request sent to [facility] and left message for pt that she was working on refilling his medication.

## 2018-07-14 ENCOUNTER — Telehealth (HOSPITAL_COMMUNITY): Payer: Self-pay | Admitting: Pharmacy Technician

## 2018-07-14 NOTE — Telephone Encounter (Signed)
Oral Oncology Patient Advocate Encounter  Patients daughter Lenna Sciara emailed me signed application and tax documents to submit to [employer].  Faxed application for Wynetta Emery and [facility] in an effort to reduce patient's out of pocket expense for Zytiga to $0.    Application completed and faxed to [phone].   [employer] patient assistance phone number for follow up is [phone].   This encounter will be updated until final determination.  [place] Patient [place] Phone [phone] Fax [phone] 07/14/2018 1:07 PM

## 2018-07-19 ENCOUNTER — Other Ambulatory Visit (HOSPITAL_COMMUNITY): Payer: Self-pay

## 2018-07-19 DIAGNOSIS — C61 Malignant neoplasm of prostate: Secondary | ICD-10-CM

## 2018-07-19 DIAGNOSIS — C7951 Secondary malignant neoplasm of bone: Secondary | ICD-10-CM

## 2018-07-22 NOTE — Telephone Encounter (Signed)
Oral Oncology Patient Advocate Encounter  Patient has been temporarily approved for [employer] until 09/17/2018.  He will receive Zytiga at no out of pocket cost.  Patient must sign the Medicare Part D attestation form to continue receiving Zytiga through December 31st.  I will follow up with Melissa to make sure form is signed and returned.

## 2018-07-26 ENCOUNTER — Inpatient Hospital Stay (HOSPITAL_COMMUNITY): Payer: Medicare Other

## 2018-07-26 ENCOUNTER — Encounter (HOSPITAL_COMMUNITY): Payer: Self-pay

## 2018-07-26 ENCOUNTER — Inpatient Hospital Stay (HOSPITAL_COMMUNITY): Payer: Medicare Other | Attending: Hematology

## 2018-07-26 ENCOUNTER — Other Ambulatory Visit: Payer: Self-pay

## 2018-07-26 VITALS — BP 113/70 | HR 85 | Temp 97.6°F | Resp 18

## 2018-07-26 DIAGNOSIS — C61 Malignant neoplasm of prostate: Secondary | ICD-10-CM | POA: Diagnosis not present

## 2018-07-26 DIAGNOSIS — C7951 Secondary malignant neoplasm of bone: Secondary | ICD-10-CM | POA: Insufficient documentation

## 2018-07-26 DIAGNOSIS — Z79899 Other long term (current) drug therapy: Secondary | ICD-10-CM | POA: Insufficient documentation

## 2018-07-26 LAB — COMPREHENSIVE METABOLIC PANEL
ALBUMIN: 3.7 g/dL (ref 3.5–5.0)
ALK PHOS: 54 U/L (ref 38–126)
ALT: 11 U/L (ref 0–44)
AST: 11 U/L — AB (ref 15–41)
Anion gap: 6 (ref 5–15)
BUN: 10 mg/dL (ref 8–23)
CALCIUM: 9.3 mg/dL (ref 8.9–10.3)
CHLORIDE: 109 mmol/L (ref 98–111)
CO2: 24 mmol/L (ref 22–32)
CREATININE: 1.03 mg/dL (ref 0.61–1.24)
GFR calc Af Amer: >60 mL/min (ref >60)
GFR calc non Af Amer: >60 mL/min (ref >60)
GLUCOSE: 104 mg/dL — AB (ref 70–99)
Potassium: 4 mmol/L (ref 3.5–5.1)
SODIUM: 139 mmol/L (ref 135–145)
Total Bilirubin: 0.6 mg/dL (ref 0.3–1.2)
Total Protein: 6.5 g/dL (ref 6.5–8.1)

## 2018-07-26 MED ORDER — DENOSUMAB 120 MG/1.7ML ~~LOC~~ SOLN
120.0000 mg | Freq: Once | SUBCUTANEOUS | Status: AC
  Administered 2018-07-26: 120 mg via SUBCUTANEOUS
  Filled 2018-07-26: qty 1.7

## 2018-07-26 NOTE — Progress Notes (Signed)
Matthew [name] presents today for injection per the provider's orders.  Xgeva administration without incident; see MAR for injection details.  Patient tolerated procedure well and without incident.  No questions or complaints noted at this time. Discharged ambulatory  f

## 2018-07-30 NOTE — Telephone Encounter (Signed)
Emailed Melissa and she has printed the attestation and will fax it today.

## 2018-08-12 ENCOUNTER — Telehealth (HOSPITAL_COMMUNITY): Payer: Self-pay | Admitting: *Deleted

## 2018-08-12 ENCOUNTER — Other Ambulatory Visit (HOSPITAL_COMMUNITY): Payer: Self-pay

## 2018-08-12 ENCOUNTER — Encounter (HOSPITAL_COMMUNITY): Payer: Self-pay | Admitting: Genetic Counselor

## 2018-08-12 ENCOUNTER — Other Ambulatory Visit (HOSPITAL_COMMUNITY): Payer: Self-pay | Admitting: *Deleted

## 2018-08-12 ENCOUNTER — Inpatient Hospital Stay (HOSPITAL_COMMUNITY): Payer: Medicare Other | Attending: Hematology | Admitting: Genetic Counselor

## 2018-08-12 DIAGNOSIS — Z800 Family history of malignant neoplasm of digestive organs: Secondary | ICD-10-CM

## 2018-08-12 DIAGNOSIS — C61 Malignant neoplasm of prostate: Secondary | ICD-10-CM

## 2018-08-12 DIAGNOSIS — C7951 Secondary malignant neoplasm of bone: Secondary | ICD-10-CM | POA: Insufficient documentation

## 2018-08-12 DIAGNOSIS — Z8042 Family history of malignant neoplasm of prostate: Secondary | ICD-10-CM | POA: Diagnosis not present

## 2018-08-12 DIAGNOSIS — G893 Neoplasm related pain (acute) (chronic): Secondary | ICD-10-CM

## 2018-08-12 MED ORDER — FENTANYL 75 MCG/HR TD PT72: 75.0000 ug | MEDICATED_PATCH | TRANSDERMAL | 0 refills | Status: DC

## 2018-08-12 MED ORDER — HYDROCODONE-ACETAMINOPHEN 10-325 MG PO TABS: 1.0000 | ORAL_TABLET | ORAL | 0 refills | Status: DC | PRN

## 2018-08-12 NOTE — Progress Notes (Signed)
REFERRING PROVIDER: Derek Jack, MD [address] [place], [place] [address]  PRIMARY PROVIDER:  [name], MD  PRIMARY REASON FOR VISIT:  1. Prostate carcinoma ([place])   2. Family history of prostate cancer   3. Family history of stomach cancer      HISTORY OF PRESENT ILLNESS:   Mr. Towery, a 63 y.o. male, was seen for a [place] cancer genetics consultation at the request of Dr. Delton Coombes due to a personal and family history of cancer.  Mr. Koeller presents to clinic today to discuss the possibility of a hereditary predisposition to cancer, genetic testing, and to further clarify his future cancer risks, as well as potential cancer risks for family members.   In 2017, at the age of 89, Mr. Gardella was diagnosed with metastatic prostate cancer. There is a family history of heart disease and cancer.     CANCER HISTORY:    Prostate carcinoma ([place])   12/10/2015 Imaging    CTA chest, multiple thoracic and rib osseous lesions. no primary evident. Atherosclerosis including aortic and CAD    12/12/2015 Imaging    CT abdomen/pelvis with sclerotic osseous lesions througout the lumbar spine, sacrum, bilateral iliac bones and L proximal femur worrisome for sclerotic osseous mets,mild prostatomegaly, no LAD, Infrarenal 3.8 cm AAA    12/12/2015 Tumor Marker    PSA 153.85    12/18/2015 Imaging    Bone Scan Diffuse bony metastatic disease, posterior calvarium, sternum, thoracic, lumbar spine, bilateral ribs, L shoulder, bilateral bony pelvis, proximal L femur    12/24/2015 Initial Biopsy    CT biopsy of L iliac bone lesion    12/26/2015 Pathology Results    Bone, biopsy, left iliac - POSITIVE FOR METASTATIC ADENOCARCINOMA.    12/28/2015 - 09/01/2016 Chemotherapy    Firmagon instituted 240 mg     01/01/2016 Procedure    Port placed by IR.    01/07/2016 - 04/11/2016 Chemotherapy    Docetaxel every 21 days x 6 cycles with dose reductions due to tolerance    02/18/2016 Adverse Reaction    GI  toxicity from docetaxel, dose reduced to 60 mg/m2    02/18/2016 Treatment Plan Change    Docetaxel dose reduced by 20%    03/06/2016 Procedure    Dr. Enrique Sack- Multiple extraction of tooth numbers 3, 5, 6, 8, 9, 10, 11, 12, 20, 21, 22, 27, 28, and 31. 4 Quadrants of alveoloplasty. Bilateral mandibular lingual tori reductions.    04/09/2016 Imaging    Brain MRI 1. No intracranial metastatic disease 2. No acute intracranial abnormality 3. Findings of chronic microvascular disease    05/14/2016 Imaging    CT C/A/P Stable appearing diffuse bony metastatic disease. No new significant disease is seen Innumerable patchy sclerotic osseous metastases throughout the axial and proximal appendicular skeleton, stable in size and distribution, significantly increased in density in the interval, likely reflecting treatment effect. 2. No new or progressive metastatic disease in the chest, abdomen or pelvis. 3. Bilateral perifissural pulmonary nodules are stable and probably benign. 4. Aortic atherosclerosis. Infrarenal 4.3 cm abdominal aortic aneurysm, minimally increased in size. Recommend followup by ultrasound in 1 year.    06/02/2016 Tumor Marker    PSA 0.99 ng/ml     - 07/31/2016 Radiation Therapy    Palliative XRT by Dr. Lianne Cure in [place].     Chemotherapy    Depo-[name] every 3 months, beginning in Feb 2018       Past Medical History:  Diagnosis Date  . Arthritis  Left knee  . Bone cancer ([place])   . Bone metastases ([place]) 12/26/2015  . Cancer [facility])    Prostate  . DVT (deep venous thrombosis) ([place]) 2004  . Family history of prostate cancer   . Family history of stomach cancer   . Kidney stones    ~2002  . Prostate carcinoma ([place]) 12/26/2015    Past Surgical History:  Procedure Laterality Date  . CIRCUMCISION  30 years ago  . MULTIPLE EXTRACTIONS WITH ALVEOLOPLASTY N/A 03/06/2016   Procedure: Extraction of tooth #'s 3,5,6,8-12,20-22, 27, 28, and 31 with alveoloplasty and bilateral  mandibular tori reductions;  Surgeon: Lenn Cal, DDS;  Location: [facility];  Service: Oral Surgery;  Laterality: N/A;    Social History   Socioeconomic History  . Marital status: Single    Spouse name: Not on file  . Number of children: 2  . Years of education: Not on file  . Highest education level: Not on file  Occupational History  . Occupation: Research scientist (life sciences)  Social Needs  . Financial resource strain: Not on file  . Food insecurity:    Worry: Not on file    Inability: Not on file  . Transportation needs:    Medical: Not on file    Non-medical: Not on file  Tobacco Use  . Smoking status: Current Every Day Smoker    Packs/day: 1.00    Years: 50.00    Pack years: 50.00    Types: Cigarettes    Start date: 06/16/1965  . Smokeless tobacco: Former Systems developer    Types: [place] date: 08/04/2005  Substance and Sexual Activity  . Alcohol use: No    Alcohol/week: 0.0 standard drinks  . Drug use: Yes    Types: Marijuana    Comment: THC for appetite; occ  . Sexual activity: Not on file  Lifestyle  . Physical activity:    Days per week: Not on file    Minutes per session: Not on file  . Stress: Not on file  Relationships  . Social connections:    Talks on phone: Not on file    Gets together: Not on file    Attends religious service: Not on file    Active member of club or organization: Not on file    Attends meetings of clubs or organizations: Not on file    Relationship status: Not on file  Other Topics Concern  . Not on file  Social History Narrative  . Not on file     FAMILY HISTORY:  We obtained a detailed, 4-generation family history.  Significant diagnoses are listed below: Family History  Problem Relation Age of Onset  . Stomach cancer Mother        dx 48s  . Aneurysm Maternal Aunt        brain  . Alcohol abuse Maternal Uncle   . Stomach cancer Maternal Grandmother        dx 50s-60s  . Prostate cancer Maternal Uncle        dx 14s    The  patient has one biological daughter and a step daughter.  His biological daughter is cancer free.  He was raised by his MGM.  His biological mother had two other children, a son and daughter, but the patient is not aware of where they are.  His biological mother is deceased.  Nothing is known about his father.  The patient's mother had stomach cancer in her 15's and died in her 32's. She  had two sisters and two brothers.  One brother died of prostate cancer in his 21's.  The MGM had stomach cancer and bone cancer.  She had several sisters who had unknown forms of cancer.  He is not sure about his biological MGF.  Mr. Mundt is unaware of previous family history of genetic testing for hereditary cancer risks. Patient's maternal ancestors are of Caucasian descent, and paternal ancestors are of unknown descent. There is no reported Ashkenazi Jewish ancestry. There is no known consanguinity.  GENETIC COUNSELING ASSESSMENT: MARIUSZ JUBB is a 63 y.o. male with a personal and family history of cancer which is somewhat suggestive of a hereditary cancer syndrome and predisposition to cancer. We, therefore, discussed and recommended the following at today's visit.   DISCUSSION: We discussed that about 15-20% of prostate cancer is hereditary with most cases of aggressive prostate cancer due to  BRCA mutations.  There are other genes associated with hereditary prostate cancer syndromes, Lynch syndrome can be seen with both prostate and stomach cancer syndromes, however, without a family history of colon cancer it makes it less likely that Lynch syndrome is involved.  We reviewed the characteristics, features and inheritance patterns of hereditary cancer syndromes. We also discussed genetic testing, including the appropriate family members to test, the process of testing, insurance coverage and turn-around-time for results. We discussed the implications of a negative, positive and/or variant of uncertain significant  result. We recommended Mr. Manzer pursue genetic testing for the multi-cancer gene panel. The Multi-Gene Panel offered by [facility] includes sequencing and/or deletion duplication testing of the following 84 genes: AIP, ALK, APC, ATM, AXIN2,BAP1,  BARD1, BLM, BMPR1A, BRCA1, BRCA2, BRIP1, CASR, CDC73, CDH1, CDK4, CDKN1B, CDKN1C, CDKN2A (p14ARF), CDKN2A (p16INK4a), CEBPA, CHEK2, CTNNA1, DICER1, DIS3L2, EGFR (c.2369C>T, p.Thr790Met variant only), EPCAM (Deletion/duplication testing only), FH, FLCN, GATA2, GPC3, GREM1 (Promoter region deletion/duplication testing only), HOXB13 (c.251G>A, p.Gly84Glu), HRAS, KIT, MAX, MEN1, MET, MITF (c.952G>A, p.Glu318Lys variant only), MLH1, MSH2, MSH3, MSH6, MUTYH, NBN, NF1, NF2, NTHL1, PALB2, PDGFRA, PHOX2B, PMS2, POLD1, POLE, POT1, PRKAR1A, PTCH1, PTEN, RAD50, RAD51C, RAD51D, RB1, RECQL4, RET, RUNX1, SDHAF2, SDHA (sequence changes only), SDHB, SDHC, SDHD, SMAD4, SMARCA4, SMARCB1, SMARCE1, STK11, SUFU, TERC, TERT, TMEM127, TP53, TSC1, TSC2, VHL, WRN and WT1.    Based on Mr. Batz personal and family history of cancer, he meets medical criteria for genetic testing. Despite that he meets criteria, he may still have an out of pocket cost. We discussed that if his out of pocket cost for testing is over $100, the laboratory will call and confirm whether he wants to proceed with testing.  If the out of pocket cost of testing is less than $100 he will be billed by the genetic testing laboratory.   PLAN: After considering the risks, benefits, and limitations, Mr. Williamson  provided informed consent to pursue genetic testing and the blood sample was sent to [facility] for analysis of the Multi-cancer panel. Results should be available within approximately 2-3 weeks' time, at which point they will be disclosed by telephone to Mr. Fout, as will any additional recommendations warranted by these results. Mr. Brueggemann will receive a summary of his genetic counseling visit and a copy of his  results once available. This information will also be available in Epic. We encouraged Mr. Ruan to remain in contact with cancer genetics annually so that we can continuously update the family history and inform him of any changes in cancer genetics and testing that may be of benefit for his family.  Mr. Plouff questions were answered to his satisfaction today. Our contact information was provided should additional questions or concerns arise.  Lastly, we encouraged Mr. Shoemaker to remain in contact with cancer genetics annually so that we can continuously update the family history and inform him of any changes in cancer genetics and testing that may be of benefit for this family.   Mr.  Uttech questions were answered to his satisfaction today. Our contact information was provided should additional questions or concerns arise. Thank you for the referral and allowing [place] to share in the care of your patient.   Nussen Pullin P. Florene Glen, [place], [facility] Certified Genetic Counselor [email or web address] phone: [phone]  The patient was seen for a total of 45 minutes in face-to-face genetic counseling.  This patient was discussed with Drs. Magrinat, Lindi Adie and/or Burr Medico who agrees with the above.    _______________________________________________________________________ For Office Staff:  Number of people involved in session: 1 Was an Intern/ student involved with case: no

## 2018-08-16 NOTE — Telephone Encounter (Signed)
Oral Oncology Patient Advocate Encounter  Spoke with [employer] today to verify they had received the Medicare D Attestation.  They have received the attestation form and Mr Lefkowitz is approved to received Zytiga from [place] until 08/04/2019.  [place] Patient [facility] Phone [phone] Fax [phone] 08/16/2018 12:05 PM

## 2018-08-17 ENCOUNTER — Other Ambulatory Visit (HOSPITAL_COMMUNITY): Payer: Self-pay | Admitting: *Deleted

## 2018-08-17 DIAGNOSIS — C61 Malignant neoplasm of prostate: Secondary | ICD-10-CM

## 2018-08-17 MED ORDER — ABIRATERONE ACETATE 250 MG PO TABS: 1000.0000 mg | ORAL_TABLET | Freq: Every day | ORAL | 0 refills | Status: DC

## 2018-08-17 NOTE — Telephone Encounter (Signed)
Chart reviewed, Zytiga refilled. 

## 2018-08-23 ENCOUNTER — Inpatient Hospital Stay (HOSPITAL_COMMUNITY): Payer: Medicare Other

## 2018-08-23 DIAGNOSIS — C61 Malignant neoplasm of prostate: Secondary | ICD-10-CM

## 2018-08-23 DIAGNOSIS — C7951 Secondary malignant neoplasm of bone: Secondary | ICD-10-CM | POA: Diagnosis not present

## 2018-08-23 LAB — COMPREHENSIVE METABOLIC PANEL
ALT: 11 U/L (ref 0–44)
AST: 13 U/L — ABNORMAL LOW (ref 15–41)
Albumin: 3.9 g/dL (ref 3.5–5.0)
Alkaline Phosphatase: 67 U/L (ref 38–126)
Anion gap: 8 (ref 5–15)
BUN: 12 mg/dL (ref 8–23)
CALCIUM: 8.7 mg/dL — AB (ref 8.9–10.3)
CO2: 23 mmol/L (ref 22–32)
Chloride: 107 mmol/L (ref 98–111)
Creatinine, Ser: 1.03 mg/dL (ref 0.61–1.24)
GFR calc Af Amer: >60 mL/min (ref >60)
Glucose, Bld: 111 mg/dL — ABNORMAL HIGH (ref 70–99)
Potassium: 4.2 mmol/L (ref 3.5–5.1)
Sodium: 138 mmol/L (ref 135–145)
Total Bilirubin: 0.4 mg/dL (ref 0.3–1.2)
Total Protein: 7.1 g/dL (ref 6.5–8.1)

## 2018-08-23 LAB — CBC WITH DIFFERENTIAL/PLATELET
Abs Immature Granulocytes: 0.01 10*3/uL (ref 0.00–0.07)
Basophils Absolute: 0 10*3/uL (ref 0.0–0.1)
Basophils Relative: 1 %
EOS ABS: 0.1 10*3/uL (ref 0.0–0.5)
Eosinophils Relative: 3 %
HCT: 44.6 % (ref 39.0–52.0)
Hemoglobin: 14.7 g/dL (ref 13.0–17.0)
Immature Granulocytes: 0 %
Lymphocytes Relative: 26 %
Lymphs Abs: 1.1 10*3/uL (ref 0.7–4.0)
MCH: 30.4 pg (ref 26.0–34.0)
MCHC: 33 g/dL (ref 30.0–36.0)
MCV: 92.1 fL (ref 80.0–100.0)
MONO ABS: 0.4 10*3/uL (ref 0.1–1.0)
Monocytes Relative: 10 %
Neutro Abs: 2.5 10*3/uL (ref 1.7–7.7)
Neutrophils Relative %: 60 %
Platelets: 181 10*3/uL (ref 150–400)
RBC: 4.84 MIL/uL (ref 4.22–5.81)
RDW: 13.1 % (ref 11.5–15.5)
WBC: 4.2 10*3/uL (ref 4.0–10.5)
nRBC: 0 % (ref 0.0–0.2)

## 2018-08-23 LAB — PSA: Prostatic Specific Antigen: 7.74 ng/mL — ABNORMAL HIGH (ref 0.00–4.00)

## 2018-08-24 ENCOUNTER — Inpatient Hospital Stay (HOSPITAL_BASED_OUTPATIENT_CLINIC_OR_DEPARTMENT_OTHER): Payer: Medicare Other | Admitting: Hematology

## 2018-08-24 ENCOUNTER — Encounter (HOSPITAL_COMMUNITY): Payer: Self-pay | Admitting: Hematology

## 2018-08-24 ENCOUNTER — Inpatient Hospital Stay (HOSPITAL_COMMUNITY): Payer: Medicare Other

## 2018-08-24 ENCOUNTER — Other Ambulatory Visit: Payer: Self-pay

## 2018-08-24 VITALS — BP 128/76 | HR 74 | Temp 98.5°F | Resp 16 | Wt 230.0 lb

## 2018-08-24 DIAGNOSIS — C61 Malignant neoplasm of prostate: Secondary | ICD-10-CM

## 2018-08-24 DIAGNOSIS — F17210 Nicotine dependence, cigarettes, uncomplicated: Secondary | ICD-10-CM | POA: Diagnosis not present

## 2018-08-24 DIAGNOSIS — G893 Neoplasm related pain (acute) (chronic): Secondary | ICD-10-CM

## 2018-08-24 DIAGNOSIS — Z800 Family history of malignant neoplasm of digestive organs: Secondary | ICD-10-CM

## 2018-08-24 DIAGNOSIS — C7951 Secondary malignant neoplasm of bone: Secondary | ICD-10-CM | POA: Diagnosis not present

## 2018-08-24 MED ORDER — DENOSUMAB 120 MG/1.7ML ~~LOC~~ SOLN
120.0000 mg | Freq: Once | SUBCUTANEOUS | Status: AC
  Administered 2018-08-24: 120 mg via SUBCUTANEOUS
  Filled 2018-08-24: qty 1.7

## 2018-08-24 NOTE — Assessment & Plan Note (Signed)
1.  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 -Recent rising PSA levels, patient maintained on [name], last injection 22.5 mg on 10/02/2017 -Most recent PSA of 7, CT scan (11/17/2017) and bone scan (11/13/2017) showing slight worsening of bone metastasis, no evidence of visceral metastasis - Abiraterone thousand milligrams daily with prednisone 5 mg twice daily started on 12/02/2017.  He had some mild worsening of back pain since Abiraterone. - Abiraterone was cut back to 750 mg on 01/29/2018.  PSA on 03/04/2018 went up to 2.48.  -Abiraterone was increased back to thousand milligrams daily on 03/04/2018. - We discussed the blood work.  PSA is 7.5.  I believe Abiraterone is no longer helping. -I have recommended doing a bone scan and CT scan of the abdomen and pelvis to review for progression. - He underwent genetic testing 10 days ago.  We are awaiting results of it.  If he has BRCA1/2 mutation, he will be eligible for PARP better. -We will see him back after the scans to review results and further treatment plan.  2.  Prostate cancer related bone pain: -He has pain in the mid back region and left hip. -He is currently using fentanyl 75 mcg patch.  He is taking hydrocodone 5 mg up to 3 tablets/day for breakthrough pain.  Some days he does not require any breakthrough medicine.   3.  Bone metastasis: -He is continuing calcium supplements.  His calcium is okay to proceed with Xgeva today.  4.  Family history: - Maternal grandmother had stomach cancer.  Sister had stomach cancer.  Brother had some type of cancer. -He underwent genetic testing on 08/14/2018.  We are awaiting results.

## 2018-08-24 NOTE — Patient Instructions (Signed)
[facility] Discharge Instructions  Received Xgeva injection today. Follow-up as scheduled. Call clinic for any questions or concerns   Thank you for choosing [facility] to provide your oncology and hematology care.  To afford each patient quality time with our provider, please arrive at least 15 minutes before your scheduled appointment time.   If you have a lab appointment with the Cancer Center please come in thru the  Main Entrance and check in at the main information desk  You need to re-schedule your appointment should you arrive 10 or more minutes late.  We strive to give you quality time with our providers, and arriving late affects you and other patients whose appointments are after yours.  Also, if you no show three or more times for appointments you may be dismissed from the clinic at the providers discretion.     Again, thank you for choosing [facility].  Our hope is that these requests will decrease the amount of time that you wait before being seen by our physicians.       _____________________________________________________________  Should you have questions after your visit to [facility], please contact our office at [phone] between the hours of 8:00 a.m. and 4:30 p.m.  Voicemails left after 4:00 p.m. will not be returned until the following business day.  For prescription refill requests, have your pharmacy contact our office and allow 72 hours.    Cancer Center Support Programs:   > Cancer Support Group  2nd Tuesday of the month 1pm-2pm, Journey Room   

## 2018-08-24 NOTE — Progress Notes (Signed)
[place], [place] [address]   CLINIC:  Medical Oncology/Hematology  PCP:  [name], [place] [address] [phone]   REASON FOR VISIT: Follow-up for prostate cancer  CURRENT THERAPY: Zytiga and prednisone   BRIEF ONCOLOGIC HISTORY:    Prostate carcinoma ([place])   12/10/2015 Imaging    CTA chest, multiple thoracic and rib osseous lesions. no primary evident. Atherosclerosis including aortic and CAD    12/12/2015 Imaging    CT abdomen/pelvis with sclerotic osseous lesions througout the lumbar spine, sacrum, bilateral iliac bones and L proximal femur worrisome for sclerotic osseous mets,mild prostatomegaly, no LAD, Infrarenal 3.8 cm AAA    12/12/2015 Tumor Marker    PSA 153.85    12/18/2015 Imaging    Bone Scan Diffuse bony metastatic disease, posterior calvarium, sternum, thoracic, lumbar spine, bilateral ribs, L shoulder, bilateral bony pelvis, proximal L femur    12/24/2015 Initial Biopsy    CT biopsy of L iliac bone lesion    12/26/2015 Pathology Results    Bone, biopsy, left iliac - POSITIVE FOR METASTATIC ADENOCARCINOMA.    12/28/2015 - 09/01/2016 Chemotherapy    Firmagon instituted 240 mg     01/01/2016 Procedure    Port placed by IR.    01/07/2016 - 04/11/2016 Chemotherapy    Docetaxel every 21 days x 6 cycles with dose reductions due to tolerance    02/18/2016 Adverse Reaction    GI toxicity from docetaxel, dose reduced to 60 mg/m2    02/18/2016 Treatment Plan Change    Docetaxel dose reduced by 20%    03/06/2016 Procedure    Dr. Enrique Sack- Multiple extraction of tooth numbers 3, 5, 6, 8, 9, 10, 11, 12, 20, 21, 22, 27, 28, and 31. 4 Quadrants of alveoloplasty. Bilateral mandibular lingual tori reductions.    04/09/2016 Imaging    Brain MRI 1. No intracranial metastatic disease 2. No acute intracranial abnormality 3. Findings of chronic microvascular disease    05/14/2016 Imaging    CT C/A/P Stable  appearing diffuse bony metastatic disease. No new significant disease is seen Innumerable patchy sclerotic osseous metastases throughout the axial and proximal appendicular skeleton, stable in size and distribution, significantly increased in density in the interval, likely reflecting treatment effect. 2. No new or progressive metastatic disease in the chest, abdomen or pelvis. 3. Bilateral perifissural pulmonary nodules are stable and probably benign. 4. Aortic atherosclerosis. Infrarenal 4.3 cm abdominal aortic aneurysm, minimally increased in size. Recommend followup by ultrasound in 1 year.    06/02/2016 Tumor Marker    PSA 0.99 ng/ml     - 07/31/2016 Radiation Therapy    Palliative XRT by Dr. Lianne Cure in [place].     Chemotherapy    Depo-[name] every 3 months, beginning in Feb 2018       CANCER STAGING: Cancer Staging Prostate carcinoma [facility]) Staging form: Prostate, AJCC 7th Edition - Clinical stage from 12/24/2015: Stage IV (TX, N0, M1b, PSA: 20 or greater) - Signed by Baird Cancer, PA-C on 01/07/2016    INTERVAL HISTORY:  Mr. Hassey 63 y.o. male returns for routine follow-up for prostate cancer. He reports having pain in his hip and side. He is controling it well with his patch and medications. He denies any fevers or recent infections. Denies any nausea, vomiting, or diarrhea. Denies any bleeding or easy bruising. He reports his appetite and energy level at 75%. He remains active at home and performs all his own ADLs.  REVIEW OF SYSTEMS:  Review of Systems  Respiratory: Negative for shortness of breath.   Musculoskeletal: Positive for back pain.  Neurological: Positive for numbness.  Psychiatric/Behavioral: Negative for sleep disturbance.  All other systems reviewed and are negative.    PAST MEDICAL/SURGICAL HISTORY:  Past Medical History:  Diagnosis Date  . Arthritis    Left knee  . Bone cancer ([place])   . Bone metastases ([place]) 12/26/2015  . Cancer [facility])     Prostate  . DVT (deep venous thrombosis) ([place]) 2004  . Family history of prostate cancer   . Family history of stomach cancer   . Kidney stones    ~2002  . Prostate carcinoma ([place]) 12/26/2015   Past Surgical History:  Procedure Laterality Date  . CIRCUMCISION  30 years ago  . MULTIPLE EXTRACTIONS WITH ALVEOLOPLASTY N/A 03/06/2016   Procedure: Extraction of tooth #'s 3,5,6,8-12,20-22, 27, 28, and 31 with alveoloplasty and bilateral mandibular tori reductions;  Surgeon: Lenn Cal, DDS;  Location: [facility];  Service: Oral Surgery;  Laterality: N/A;     SOCIAL HISTORY:  Social History   Socioeconomic History  . Marital status: Single    Spouse name: Not on file  . Number of children: 2  . Years of education: Not on file  . Highest education level: Not on file  Occupational History  . Occupation: Research scientist (life sciences)  Social Needs  . Financial resource strain: Not on file  . Food insecurity:    Worry: Not on file    Inability: Not on file  . Transportation needs:    Medical: Not on file    Non-medical: Not on file  Tobacco Use  . Smoking status: Current Every Day Smoker    Packs/day: 1.00    Years: 50.00    Pack years: 50.00    Types: Cigarettes    Start date: 06/16/1965  . Smokeless tobacco: Former Systems developer    Types: [place] date: 08/04/2005  Substance and Sexual Activity  . Alcohol use: No    Alcohol/week: 0.0 standard drinks  . Drug use: Yes    Types: Marijuana    Comment: THC for appetite; occ  . Sexual activity: Not on file  Lifestyle  . Physical activity:    Days per week: Not on file    Minutes per session: Not on file  . Stress: Not on file  Relationships  . Social connections:    Talks on phone: Not on file    Gets together: Not on file    Attends religious service: Not on file    Active member of club or organization: Not on file    Attends meetings of clubs or organizations: Not on file    Relationship status: Not on file  . Intimate partner  violence:    Fear of current or ex partner: Not on file    Emotionally abused: Not on file    Physically abused: Not on file    Forced sexual activity: Not on file  Other Topics Concern  . Not on file  Social History Narrative  . Not on file    FAMILY HISTORY:  Family History  Problem Relation Age of Onset  . Stomach cancer Mother        dx 46s  . Aneurysm Maternal Aunt        brain  . Alcohol abuse Maternal Uncle   . Stomach cancer Maternal Grandmother        dx 50s-60s  . Prostate  cancer Maternal Uncle        dx 65s    CURRENT MEDICATIONS:  Outpatient Encounter Medications as of 08/24/2018  Medication Sig  . abiraterone acetate (ZYTIGA) 250 MG tablet Take 4 tablets (1,000 mg total) by mouth daily. Take on an empty stomach 1 hour before or 2 hours after a meal  . Calcium Carb-Cholecalciferol (CALCIUM 500 + D3) 500-600 MG-UNIT TABS Take 2 tablets by mouth daily.  . ergocalciferol (VITAMIN D2) 50000 units capsule Take 1 capsule (50,000 Units total) by mouth every Friday.  . fentaNYL (DURAGESIC - DOSED MCG/HR) 75 MCG/HR Place 1 patch (75 mcg total) onto the skin every 3 (three) days.  Marland Kitchen HYDROcodone-acetaminophen (NORCO) 10-325 MG tablet Take 1 tablet by mouth every 4 (four) hours as needed.  . lidocaine-prilocaine (EMLA) cream Apply a quarter size amount to port site 1 hour prior to chemo. Do not rub in. Cover with plastic wrap.  . lubiprostone (AMITIZA) 24 MCG capsule Take 1 capsule (24 mcg total) by mouth 2 (two) times daily with a meal.  . omeprazole (PRILOSEC) 20 MG capsule Take 1 capsule (20 mg total) by mouth daily. (Patient taking differently: Take 20 mg by mouth as needed. )  . predniSONE (DELTASONE) 5 MG tablet TAKE 1 TABLET BY MOUTH TWICE DAILY WITH A MEAL  . prochlorperazine (COMPAZINE) 10 MG tablet Take 1 tablet (10 mg total) by mouth every 6 (six) hours as needed for nausea or vomiting.  . rosuvastatin (CRESTOR) 20 MG tablet TAKE ONE TABLET BY MOUTH ONCE DAILY  .  temazepam (RESTORIL) 15 MG capsule Take 1 capsule (15 mg total) by mouth at bedtime as needed for sleep.  . Wheat Dextrin (BENEFIBER PO) Take by mouth as needed.   No facility-administered encounter medications on file as of 08/24/2018.     ALLERGIES:  Allergies  Allergen Reactions  . Meloxicam Other (See Comments)    Caused stomach bleeding.     PHYSICAL EXAM:  ECOG Performance status: 1  Vitals:   08/24/18 1400  BP: 128/76  Pulse: 74  Resp: 16  Temp: 98.5 F (36.9 C)  SpO2: 99%   Filed Weights   08/24/18 1400  Weight: 230 lb (104.3 kg)    Physical Exam Constitutional:      Appearance: He is well-developed.  Musculoskeletal: Normal range of motion.  Skin:    General: Skin is warm and dry.  Neurological:     Mental Status: He is alert and oriented to person, place, and time.  Psychiatric:        Behavior: Behavior normal.        Thought Content: Thought content normal.        Judgment: Judgment normal.      LABORATORY DATA:  I have reviewed the labs as listed.  CBC    Component Value Date/Time   WBC 4.2 08/23/2018 1310   RBC 4.84 08/23/2018 1310   HGB 14.7 08/23/2018 1310   HCT 44.6 08/23/2018 1310   PLT 181 08/23/2018 1310   MCV 92.1 08/23/2018 1310   MCH 30.4 08/23/2018 1310   MCHC 33.0 08/23/2018 1310   RDW 13.1 08/23/2018 1310   LYMPHSABS 1.1 08/23/2018 1310   MONOABS 0.4 08/23/2018 1310   EOSABS 0.1 08/23/2018 1310   BASOSABS 0.0 08/23/2018 1310   CMP Latest Ref Rng & Units 08/23/2018 07/26/2018 06/21/2018  Glucose 70 - 99 mg/dL 111(H) 104(H) 115(H)  BUN 8 - 23 mg/dL '12 10 9  ' Creatinine 0.61 - 1.24 mg/dL 1.03  1.03 1.07  Sodium 135 - 145 mmol/L 138 139 138  Potassium 3.5 - 5.1 mmol/L 4.2 4.0 3.6  Chloride 98 - 111 mmol/L 107 109 104  CO2 22 - 32 mmol/L '23 24 25  ' Calcium 8.9 - 10.3 mg/dL 8.7(L) 9.3 8.8(L)  Total Protein 6.5 - 8.1 g/dL 7.1 6.5 7.0  Total Bilirubin 0.3 - 1.2 mg/dL 0.4 0.6 0.6  Alkaline Phos 38 - 126 U/L 67 54 63  AST 15 -  41 U/L 13(L) 11(L) 15  ALT 0 - 44 U/L '11 11 10    ' I have independently reviewed the scans and discussed with the patient.      ASSESSMENT & PLAN:   Prostate carcinoma ([place]) 1.  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 -Recent rising PSA levels, patient maintained on [name], last injection 22.5 mg on 10/02/2017 -Most recent PSA of 7, CT scan (11/17/2017) and bone scan (11/13/2017) showing slight worsening of bone metastasis, no evidence of visceral metastasis - Abiraterone thousand milligrams daily with prednisone 5 mg twice daily started on 12/02/2017.  He had some mild worsening of back pain since Abiraterone. - Abiraterone was cut back to 750 mg on 01/29/2018.  PSA on 03/04/2018 went up to 2.48.  -Abiraterone was increased back to thousand milligrams daily on 03/04/2018. - We discussed the blood work.  PSA is 7.5.  I believe Abiraterone is no longer helping. -I have recommended doing a bone scan and CT scan of the abdomen and pelvis to review for progression. - He underwent genetic testing 10 days ago.  We are awaiting results of it.  If he has BRCA1/2 mutation, he will be eligible for PARP better. -We will see him back after the scans to review results and further treatment plan.  2.  Prostate cancer related bone pain: -He has pain in the mid back region and left hip. -He is currently using fentanyl 75 mcg patch.  He is taking hydrocodone 5 mg up to 3 tablets/day for breakthrough pain.  Some days he does not require any breakthrough medicine.   3.  Bone metastasis: -He is continuing calcium supplements.  His calcium is okay to proceed with Xgeva today.  4.  Family history: - Maternal grandmother had stomach cancer.  Sister had stomach cancer.  Brother had some type of cancer. -He underwent genetic testing on 08/14/2018.  We are awaiting results.       Orders placed this encounter:  Orders Placed This Encounter  Procedures  . NM Bone Scan Whole Body  . CT Abdomen Pelvis W Contrast      Derek Jack, MD [place] [phone]

## 2018-08-24 NOTE — Patient Instructions (Signed)
[facility] at [facility] Discharge Instructions  Follow up after your scans   Thank you for choosing [facility] at [facility] to provide your oncology and hematology care.  To afford each patient quality time with our provider, please arrive at least 15 minutes before your scheduled appointment time.   If you have a lab appointment with the Cancer Center please come in thru the  Main Entrance and check in at the main information desk  You need to re-schedule your appointment should you arrive 10 or more minutes late.  We strive to give you quality time with our providers, and arriving late affects you and other patients whose appointments are after yours.  Also, if you no show three or more times for appointments you may be dismissed from the clinic at the providers discretion.     Again, thank you for choosing [facility].  Our hope is that these requests will decrease the amount of time that you wait before being seen by our physicians.       _____________________________________________________________  Should you have questions after your visit to [facility], please contact our office at [phone] between the hours of 8:00 a.m. and 4:30 p.m.  Voicemails left after 4:00 p.m. will not be returned until the following business day.  For prescription refill requests, have your pharmacy contact our office and allow 72 hours.    Cancer Center Support Programs:   > Cancer Support Group  2nd Tuesday of the month 1pm-2pm, Journey Room    

## 2018-08-24 NOTE — Progress Notes (Signed)
[name] [place] tolerated Xgeva injection well without complaints or incident. Calcium 8.7 today and pt denied any tooth or jaw pain and no recent or future dental visits. Pt continues to take Calcium PO as prescribed without issues. Pt discharged self ambulatory in satisfactory condition

## 2018-08-26 ENCOUNTER — Encounter: Payer: Self-pay | Admitting: Genetic Counselor

## 2018-08-26 ENCOUNTER — Telehealth: Payer: Self-pay | Admitting: Genetic Counselor

## 2018-08-26 DIAGNOSIS — Z1379 Encounter for other screening for genetic and chromosomal anomalies: Secondary | ICD-10-CM | POA: Insufficient documentation

## 2018-08-26 NOTE — Telephone Encounter (Signed)
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 Discussed that this genetic change does not explain his metastatic prostate cancer or the cancer within the family.  He will have his daughter, [name], call and get information about testing.

## 2018-08-26 NOTE — Telephone Encounter (Signed)
LM on VM that results are back and to please call to discuss.

## 2018-08-27 ENCOUNTER — Ambulatory Visit: Payer: Self-pay | Admitting: Genetic Counselor

## 2018-08-27 DIAGNOSIS — Z1379 Encounter for other screening for genetic and chromosomal anomalies: Secondary | ICD-10-CM

## 2018-08-27 NOTE — Progress Notes (Addendum)
snipHPI:  Mr. Terrien was previously seen in the [facility] due to a personal and family history of cancer and concerns regarding a hereditary predisposition to cancer. Please refer to our prior cancer genetics clinic note for more information regarding Mr. Grygiel medical, social and family histories, and our assessment and recommendations, at the time. Mr. Grunewald recent genetic test results were disclosed to him, as were recommendations warranted by these results. These results and recommendations are discussed in more detail below.  CANCER HISTORY:    Prostate carcinoma ([place])   12/10/2015 Imaging    CTA chest, multiple thoracic and rib osseous lesions. no primary evident. Atherosclerosis including aortic and CAD    12/12/2015 Imaging    CT abdomen/pelvis with sclerotic osseous lesions througout the lumbar spine, sacrum, bilateral iliac bones and L proximal femur worrisome for sclerotic osseous mets,mild prostatomegaly, no LAD, Infrarenal 3.8 cm AAA    12/12/2015 Tumor Marker    PSA 153.85    12/18/2015 Imaging    Bone Scan Diffuse bony metastatic disease, posterior calvarium, sternum, thoracic, lumbar spine, bilateral ribs, L shoulder, bilateral bony pelvis, proximal L femur    12/24/2015 Initial Biopsy    CT biopsy of L iliac bone lesion    12/26/2015 Pathology Results    Bone, biopsy, left iliac - POSITIVE FOR METASTATIC ADENOCARCINOMA.    12/28/2015 - 09/01/2016 Chemotherapy    Firmagon instituted 240 mg     01/01/2016 Procedure    Port placed by IR.    01/07/2016 - 04/11/2016 Chemotherapy    Docetaxel every 21 days x 6 cycles with dose reductions due to tolerance    02/18/2016 Adverse Reaction    GI toxicity from docetaxel, dose reduced to 60 mg/m2    02/18/2016 Treatment Plan Change    Docetaxel dose reduced by 20%    03/06/2016 Procedure    Dr. Enrique Sack- Multiple extraction of tooth numbers 3, 5, 6, 8, 9, 10, 11, 12, 20, 21, 22, 27, 28, and 31. 4 Quadrants of  alveoloplasty. Bilateral mandibular lingual tori reductions.    04/09/2016 Imaging    Brain MRI 1. No intracranial metastatic disease 2. No acute intracranial abnormality 3. Findings of chronic microvascular disease    05/14/2016 Imaging    CT C/A/P Stable appearing diffuse bony metastatic disease. No new significant disease is seen Innumerable patchy sclerotic osseous metastases throughout the axial and proximal appendicular skeleton, stable in size and distribution, significantly increased in density in the interval, likely reflecting treatment effect. 2. No new or progressive metastatic disease in the chest, abdomen or pelvis. 3. Bilateral perifissural pulmonary nodules are stable and probably benign. 4. Aortic atherosclerosis. Infrarenal 4.3 cm abdominal aortic aneurysm, minimally increased in size. Recommend followup by ultrasound in 1 year.    06/02/2016 Tumor Marker    PSA 0.99 ng/ml     - 07/31/2016 Radiation Therapy    Palliative XRT by Dr. Lianne Cure in [place].     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  The Multi-Gene Panel offered by [facility] includes sequencing and/or deletion duplication testing of the following 85 genes: AIP, ALK, APC, ATM, AXIN2,BAP1,  BARD1, BLM, BMPR1A, BRCA1, BRCA2, BRIP1, CASR, CDC73, CDH1, CDK4, CDKN1B, CDKN1C, CDKN2A (p14ARF), CDKN2A (p16INK4a), CEBPA, CHEK2, CTNNA1, DICER1, DIS3L2, EGFR (c.2369C>T, p.Thr790Met variant only), EPCAM (Deletion/duplication testing only), FH, FLCN, GATA2, GPC3, GREM1 (Promoter  region deletion/duplication testing only), HOXB13 (c.251G>A, p.Gly84Glu), HRAS, KIT, MAX, MEN1, MET, MITF (c.952G>A, p.Glu318Lys variant only), MLH1, MSH2, MSH3, MSH6, MUTYH, NBN, NF1, NF2, NTHL1, PALB2, PDGFRA, PHOX2B, PMS2, POLD1, POLE, POT1, PRKAR1A, PTCH1, PTEN, RAD50, RAD51C, RAD51D, RB1, RECQL4, RET, RNF43,  RUNX1, SDHAF2, SDHA (sequence changes only), SDHB, SDHC, SDHD, SMAD4, SMARCA4, SMARCB1, SMARCE1, STK11, SUFU, TERC, TERT, TMEM127, TP53, TSC1, TSC2, VHL, WRN and WT1.  The report date is August 24, 2018  This is not thought to increase the risk for colon cancer or other cancers, unless there is the presence of two pathogenic mutations.     FAMILY HISTORY:  We obtained a detailed, 4-generation family history.  Significant diagnoses are listed below: Family History  Problem Relation Age of Onset  . Stomach cancer Mother        dx 97s  . Aneurysm Maternal Aunt        brain  . Alcohol abuse Maternal Uncle   . Stomach cancer Maternal Grandmother        dx 50s-60s  . Prostate cancer Maternal Uncle        dx 2s    The patient has one biological daughter and a step daughter.  His biological daughter is cancer free.  He was raised by his MGM.  His biological mother had two other children, a son and daughter, but the patient is not aware of where they are.  His biological mother is deceased.  Nothing is known about his father.  The patient's mother had stomach cancer in her 35's and died in her 54's. She had two sisters and two brothers.  One brother died of prostate cancer in his 1's.  The MGM had stomach cancer and bone cancer.  She had several sisters who had unknown forms of cancer.  He is not sure about his biological MGF.  Mr. Pelzer is unaware of previous family history of genetic testing for hereditary cancer risks. Patient's maternal ancestors are of Caucasian descent, and paternal ancestors are of unknown descent. There is no reported Ashkenazi Jewish ancestry. There is no known consanguinity.   GENETIC TEST RESULTS: Genetic testing reported out on August 24, 2018 through the Multi-cancer panel revealed one pathogenic mutation in a gene called MUTYH (also known as MYH). The mutation is called, MUTYH,c.1187G>A.  The Multi-Gene Panel offered by [facility] includes sequencing and/or  deletion duplication testing of the following 84 genes: AIP, ALK, APC, ATM, AXIN2,BAP1,  BARD1, BLM, BMPR1A, BRCA1, BRCA2, BRIP1, CASR, CDC73, CDH1, CDK4, CDKN1B, CDKN1C, CDKN2A (p14ARF), CDKN2A (p16INK4a), CEBPA, CHEK2, CTNNA1, DICER1, DIS3L2, EGFR (c.2369C>T, p.Thr790Met variant only), EPCAM (Deletion/duplication testing only), FH, FLCN, GATA2, GPC3, GREM1 (Promoter region deletion/duplication testing only), HOXB13 (c.251G>A, p.Gly84Glu), HRAS, KIT, MAX, MEN1, MET, MITF (c.952G>A, p.Glu318Lys variant only), MLH1, MSH2, MSH3, MSH6, MUTYH, NBN, NF1, NF2, NTHL1, PALB2, PDGFRA, PHOX2B, PMS2, POLD1, POLE, POT1, PRKAR1A, PTCH1, PTEN, RAD50, RAD51C, RAD51D, RB1, RECQL4, RET, RUNX1, SDHAF2, SDHA (sequence changes only), SDHB, SDHC, SDHD, SMAD4, SMARCA4, SMARCB1, SMARCE1, STK11, SUFU, TERC, TERT, TMEM127, TP53, TSC1, TSC2, VHL, WRN and WT1.  The test report has been scanned into EPIC and is located under the Molecular Pathology section of the Results Review tab.    We reviewed recessive inheritance and discussed when an individual has two MYH gene mutations, this is associated with an increased risk for adenomatous (precancerous) colon polyps. However, having one MYH gene mutation constitutes being a carrier with little or no increased risk for cancer.   Recently, the [employer]  (NCCN: V.1.2020)  provided national guidelines to manage the colon cancer risk found with single (heterozygous) MUTYH mutation.  These guidelines include having a MUTYH pathogenic mutation and:  Personal history of colon cancer  Follow instructions provided by your physician based on your personal history.  Do not have a personal history of colon cancer but have a parent/sibling/child with colon cancer: Colonoscopy every 5 years starting at age 28 or 41 years younger than the earliest age of onset, whichever is younger.  Do not have a personal history of colon cancer and do not have a parent/sibling/child with  colon cancer: Data is uncertain whether specialized screening is needed.  However, to our current knowledge, individuals with only one MYH gene mutation do not have an increased risk for colon polyps.   CANCER SCREENING RECOMMENDATIONS:  This result is reassuring and indicates that Mr. Leffler likely does not have an increased risk for a future cancer due to a mutation in one of these genes. This normal test also suggests that Mr. Sambrano cancer was most likely not due to an inherited predisposition associated with one of these genes.  Most cancers happen by chance and this negative test suggests that his cancer falls into this category.  We, therefore, recommended he continue to follow the cancer management and screening guidelines provided by his oncology and primary healthcare provider.   An individual's cancer risk and medical management are not determined by genetic test results alone. Overall cancer risk assessment incorporates additional factors, including personal medical history, family history, and any available genetic information that may result in a personalized plan for cancer prevention and surveillance.  RECOMMENDATIONS FOR FAMILY MEMBERS:  It is important that all of Mr. Gage relatives (both men and women) know of the presence of this gene mutation. Site-specific genetic testing can sort out who in the family is at risk and who is not.   Mr. Edgell daughter and siblings have a 50% chance to have inherited this mutation. While we recommend they have genetic testing for this same mutation, as identifying the presence of this mutation would allow them to also take advantage of risk-reducing measures. They will also need to consider doing full genetic testing on the MYH genes, as testing for this one mutation will not identify if there is a second mutation inherited from the other parent.    Individuals in this family might be at some increased risk of developing cancer, over the general  population risk, simply due to the family history of cancer.    We recommended women in this family have a yearly mammogram beginning at age 18, or 32 years younger than the earliest onset of cancer, an annual clinical breast exam, and perform monthly breast self-exams. Women in this family should also have a gynecological exam as recommended by their primary provider. All family members should have a colonoscopy by age 38.  FOLLOW-UP: Lastly, we discussed with Mr. Tijerina that cancer genetics is a rapidly advancing field and it is possible that new genetic tests will be appropriate for him and/or his family members in the future. We encouraged him to remain in contact with cancer genetics on an annual basis so we can update his personal and family histories and let him know of advances in cancer genetics that may benefit this family.   Our contact number was provided. Mr. Kerth questions were answered to his satisfaction, and he knows he is welcome to call us at anytime with additional questions or concerns.   Roma Kayser,  MS, [facility] Certified Genetic Counselor [email or web address]

## 2018-09-09 ENCOUNTER — Ambulatory Visit (HOSPITAL_COMMUNITY)
Admission: RE | Admit: 2018-09-09 | Discharge: 2018-09-09 | Disposition: A | Discharge status: Home / Self Care | Payer: Medicare Other | Source: Ambulatory Visit | Attending: Nurse Practitioner | Admitting: Nurse Practitioner

## 2018-09-09 ENCOUNTER — Encounter (HOSPITAL_COMMUNITY)
Admission: RE | Admit: 2018-09-09 | Discharge: 2018-09-09 | Disposition: A | Discharge status: Home / Self Care | Payer: Medicare Other | Source: Ambulatory Visit | Attending: Nurse Practitioner | Admitting: Nurse Practitioner

## 2018-09-09 DIAGNOSIS — C61 Malignant neoplasm of prostate: Secondary | ICD-10-CM | POA: Insufficient documentation

## 2018-09-09 DIAGNOSIS — C7951 Secondary malignant neoplasm of bone: Secondary | ICD-10-CM | POA: Diagnosis not present

## 2018-09-09 MED ORDER — IOHEXOL 300 MG/ML  SOLN
100.0000 mL | Freq: Once | INTRAMUSCULAR | Status: AC | PRN
  Administered 2018-09-09: 100 mL via INTRAVENOUS

## 2018-09-09 MED ORDER — TECHNETIUM TC 99M MEDRONATE IV KIT
20.0000 | PACK | Freq: Once | INTRAVENOUS | Status: AC | PRN
  Administered 2018-09-09: 19.84 via INTRAVENOUS

## 2018-09-13 ENCOUNTER — Telehealth (HOSPITAL_COMMUNITY): Payer: Self-pay | Admitting: *Deleted

## 2018-09-13 ENCOUNTER — Other Ambulatory Visit: Payer: Self-pay

## 2018-09-13 ENCOUNTER — Inpatient Hospital Stay (HOSPITAL_COMMUNITY): Payer: Medicare Other | Attending: Hematology | Admitting: Hematology

## 2018-09-13 ENCOUNTER — Encounter (HOSPITAL_COMMUNITY): Payer: Self-pay | Admitting: Hematology

## 2018-09-13 ENCOUNTER — Other Ambulatory Visit (HOSPITAL_COMMUNITY): Payer: Self-pay | Admitting: *Deleted

## 2018-09-13 DIAGNOSIS — Z5111 Encounter for antineoplastic chemotherapy: Secondary | ICD-10-CM | POA: Diagnosis not present

## 2018-09-13 DIAGNOSIS — G893 Neoplasm related pain (acute) (chronic): Secondary | ICD-10-CM | POA: Diagnosis not present

## 2018-09-13 DIAGNOSIS — F17210 Nicotine dependence, cigarettes, uncomplicated: Secondary | ICD-10-CM | POA: Diagnosis not present

## 2018-09-13 DIAGNOSIS — Z8042 Family history of malignant neoplasm of prostate: Secondary | ICD-10-CM | POA: Diagnosis not present

## 2018-09-13 DIAGNOSIS — Z800 Family history of malignant neoplasm of digestive organs: Secondary | ICD-10-CM | POA: Diagnosis not present

## 2018-09-13 DIAGNOSIS — C61 Malignant neoplasm of prostate: Secondary | ICD-10-CM

## 2018-09-13 DIAGNOSIS — C7951 Secondary malignant neoplasm of bone: Secondary | ICD-10-CM | POA: Diagnosis not present

## 2018-09-13 DIAGNOSIS — Z5189 Encounter for other specified aftercare: Secondary | ICD-10-CM | POA: Diagnosis not present

## 2018-09-13 MED ORDER — HYDROCODONE-ACETAMINOPHEN 10-325 MG PO TABS: 1.0000 | ORAL_TABLET | ORAL | 0 refills | Status: DC | PRN

## 2018-09-13 MED ORDER — FENTANYL 75 MCG/HR TD PT72: 1.0000 | MEDICATED_PATCH | TRANSDERMAL | 0 refills | Status: DC

## 2018-09-13 NOTE — Assessment & Plan Note (Addendum)
1.  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 -Recent rising PSA levels, patient maintained on [name], last injection 22.5 mg on 10/02/2017 -Most recent PSA of 7, CT scan (11/17/2017) and bone scan (11/13/2017) showing slight worsening of bone metastasis, no evidence of visceral metastasis - Abiraterone thousand milligrams daily with prednisone 5 mg twice daily started on 12/02/2017.  He had some mild worsening of back pain since Abiraterone. - Abiraterone was cut back to 750 mg on 01/29/2018.  PSA on 03/04/2018 went up to 2.48.  -Abiraterone was increased back to thousand milligrams daily on 03/04/2018. - Last PSA on 08/23/2018 increased to 7.75.   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  No BRCA related mutations noted. -I reviewed the results of the bone scan dated 09/09/2018 which showed multiple sites of metastatic disease with new focus of increased uptake at approximately T10 vertebral body. -CT of the abdomen and pelvis on 09/09/2018 did not show any visceral metastasis. - I have recommended change in therapy at this time.  We discussed the options including radium-223 and its side effects.  Patient is reluctant to travel to [place] long to get this treatment.  We also talked about the other option of cabazitaxel at a decreased dose of 15 mg/m, with the intention to increase it to 20 mg/m until further progression.  We discussed the side effects of cabazitaxel in detail.  He has some residual tingling and numbness in the feet from prior docetaxel. -Last [name] injection was on 06/28/2018, 22.5 mg.  We will plan to discontinue Abiraterone after he completes the bottle. -He would like to bring his daughter with him next Monday when she is off work.  We will decide at that time.  2.  Prostate cancer related bone pain: -She has pain in the mid back region  and left hip. -He is using fentanyl 75 mcg patch.  He continues taking hydrocodone 5 mg up to 3 to 4 tablets/day for breakthrough pain.  3.  Bone metastasis: -He will continue calcium supplements.  He will continue Xgeva monthly.  4.  Family history: - Maternal grandmother had stomach cancer.  Sister had stomach cancer.  Brother had some type of cancer. -He underwent genetic testing on 08/14/2018.  Germline mutation testing was negative.

## 2018-09-13 NOTE — Progress Notes (Signed)
[name], [place] [address]   CLINIC:  Medical Oncology/Hematology  PCP:  [name], [address] [place] [address] [phone]   REASON FOR VISIT: Follow-up for prostate cancer  CURRENT THERAPY: Zytiga and prednisone   BRIEF ONCOLOGIC HISTORY:    Prostate carcinoma ([place])   12/10/2015 Imaging    CTA chest, multiple thoracic and rib osseous lesions. no primary evident. Atherosclerosis including aortic and CAD    12/12/2015 Imaging    CT abdomen/pelvis with sclerotic osseous lesions througout the lumbar spine, sacrum, bilateral iliac bones and L proximal femur worrisome for sclerotic osseous mets,mild prostatomegaly, no LAD, Infrarenal 3.8 cm AAA    12/12/2015 Tumor Marker    PSA 153.85    12/18/2015 Imaging    Bone Scan Diffuse bony metastatic disease, posterior calvarium, sternum, thoracic, lumbar spine, bilateral ribs, L shoulder, bilateral bony pelvis, proximal L femur    12/24/2015 Initial Biopsy    CT biopsy of L iliac bone lesion    12/26/2015 Pathology Results    Bone, biopsy, left iliac - POSITIVE FOR METASTATIC ADENOCARCINOMA.    12/28/2015 - 09/01/2016 Chemotherapy    Firmagon instituted 240 mg     01/01/2016 Procedure    Port placed by IR.    01/07/2016 - 04/11/2016 Chemotherapy    Docetaxel every 21 days x 6 cycles with dose reductions due to tolerance    02/18/2016 Adverse Reaction    GI toxicity from docetaxel, dose reduced to 60 mg/m2    02/18/2016 Treatment Plan Change    Docetaxel dose reduced by 20%    03/06/2016 Procedure    Dr. Enrique Sack- Multiple extraction of tooth numbers 3, 5, 6, 8, 9, 10, 11, 12, 20, 21, 22, 27, 28, and 31. 4 Quadrants of alveoloplasty. Bilateral mandibular lingual tori reductions.    04/09/2016 Imaging    Brain MRI 1. No intracranial metastatic disease 2. No acute intracranial abnormality 3. Findings of chronic microvascular disease    05/14/2016 Imaging    CT C/A/P Stable  appearing diffuse bony metastatic disease. No new significant disease is seen Innumerable patchy sclerotic osseous metastases throughout the axial and proximal appendicular skeleton, stable in size and distribution, significantly increased in density in the interval, likely reflecting treatment effect. 2. No new or progressive metastatic disease in the chest, abdomen or pelvis. 3. Bilateral perifissural pulmonary nodules are stable and probably benign. 4. Aortic atherosclerosis. Infrarenal 4.3 cm abdominal aortic aneurysm, minimally increased in size. Recommend followup by ultrasound in 1 year.    06/02/2016 Tumor Marker    PSA 0.99 ng/ml     - 07/31/2016 Radiation Therapy    Palliative XRT by Dr. Lianne Cure in [place].     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  The Multi-Gene Panel offered by [facility] includes sequencing and/or deletion duplication testing of the following 85 genes: AIP, ALK, APC, ATM, AXIN2,BAP1,  BARD1, BLM, BMPR1A, BRCA1, BRCA2, BRIP1, CASR, CDC73, CDH1, CDK4, CDKN1B, CDKN1C, CDKN2A (p14ARF), CDKN2A (p16INK4a), CEBPA, CHEK2, CTNNA1, DICER1, DIS3L2, EGFR (c.2369C>T, p.Thr790Met variant only), EPCAM (Deletion/duplication testing only), FH, FLCN, GATA2, GPC3, GREM1 (Promoter region deletion/duplication testing only), HOXB13 (c.251G>A, p.Gly84Glu), HRAS, KIT, MAX, MEN1, MET, MITF (c.952G>A, p.Glu318Lys variant only), MLH1, MSH2, MSH3, MSH6, MUTYH, NBN, NF1, NF2, NTHL1, PALB2, PDGFRA, PHOX2B, PMS2, POLD1, POLE, POT1, PRKAR1A, PTCH1, PTEN, RAD50, RAD51C,  RAD51D, RB1, RECQL4, RET, RNF43, RUNX1, SDHAF2, SDHA (sequence changes only), SDHB, SDHC, SDHD, SMAD4, SMARCA4, SMARCB1, SMARCE1, STK11, SUFU, TERC, TERT, TMEM127, TP53, TSC1, TSC2, VHL, WRN and WT1.  The report date is August 24, 2018  This is not thought to increase the risk for colon cancer or  other cancers, unless there is the presence of two pathogenic mutations.      CANCER STAGING: Cancer Staging Prostate carcinoma [facility]) Staging form: Prostate, AJCC 7th Edition - Clinical stage from 12/24/2015: Stage IV (TX, N0, M1b, PSA: 20 or greater) - Signed by Baird Cancer, PA-C on 01/07/2016    INTERVAL HISTORY:  [name] 63 y.o. male returns for routine follow-up for prostate cancer. He is here today and having new back pain. It is constant and radiated to his shoulders. He has SOB with exertion but resolves when he rest. His fatigue has increased over the past month. Denies any nausea, vomiting, or diarrhea. Denies any new pains. Had not noticed any recent bleeding such as epistaxis, hematuria or hematochezia. Denies recent chest pain on exertion, shortness of breath on minimal exertion, pre-syncopal episodes, or palpitations. Denies any numbness or tingling in hands or feet. Denies any recent fevers, infections, or recent hospitalizations. Patient reports appetite at 25% and energy level at 0%. He is not eating well but is maintaining his weight at this time.   REVIEW OF SYSTEMS:  Review of Systems  Constitutional: Positive for fatigue.  HENT:   Positive for trouble swallowing.   Respiratory: Positive for shortness of breath.   Gastrointestinal: Positive for constipation.  Musculoskeletal: Positive for back pain.  All other systems reviewed and are negative.    PAST MEDICAL/SURGICAL HISTORY:  Past Medical History:  Diagnosis Date  . Arthritis    Left knee  . Bone cancer ([place])   . Bone metastases ([place]) 12/26/2015  . Cancer [facility])    Prostate  . DVT (deep venous thrombosis) ([place]) 2004  . Family history of prostate cancer   . Family history of stomach cancer   . Kidney stones    ~2002  . Prostate carcinoma ([place]) 12/26/2015   Past Surgical History:  Procedure Laterality Date  . CIRCUMCISION  30 years ago  . MULTIPLE EXTRACTIONS WITH ALVEOLOPLASTY N/A 03/06/2016    Procedure: Extraction of tooth #'s 3,5,6,8-12,20-22, 27, 28, and 31 with alveoloplasty and bilateral mandibular tori reductions;  Surgeon: Lenn Cal, DDS;  Location: [facility];  Service: Oral Surgery;  Laterality: N/A;     SOCIAL HISTORY:  Social History   Socioeconomic History  . Marital status: Single    Spouse name: Not on file  . Number of children: 2  . Years of education: Not on file  . Highest education level: Not on file  Occupational History  . Occupation: Research scientist (life sciences)  Social Needs  . Financial resource strain: Not on file  . Food insecurity:    Worry: Not on file    Inability: Not on file  . Transportation needs:    Medical: Not on file    Non-medical: Not on file  Tobacco Use  . Smoking status: Current Every Day Smoker    Packs/day: 1.00    Years: 50.00    Pack years: 50.00    Types: Cigarettes    Start date: 06/16/1965  . Smokeless tobacco: Former Systems developer    Types: [place] date: 08/04/2005  Substance and Sexual Activity  . Alcohol use: No    Alcohol/week: 0.0 standard drinks  .  Drug use: Yes    Types: Marijuana    Comment: THC for appetite; occ  . Sexual activity: Not on file  Lifestyle  . Physical activity:    Days per week: Not on file    Minutes per session: Not on file  . Stress: Not on file  Relationships  . Social connections:    Talks on phone: Not on file    Gets together: Not on file    Attends religious service: Not on file    Active member of club or organization: Not on file    Attends meetings of clubs or organizations: Not on file    Relationship status: Not on file  . Intimate partner violence:    Fear of current or ex partner: Not on file    Emotionally abused: Not on file    Physically abused: Not on file    Forced sexual activity: Not on file  Other Topics Concern  . Not on file  Social History Narrative  . Not on file    FAMILY HISTORY:  Family History  Problem Relation Age of Onset  . Stomach cancer  Mother        dx 64s  . Aneurysm Maternal Aunt        brain  . Alcohol abuse Maternal Uncle   . Stomach cancer Maternal Grandmother        dx 50s-60s  . Prostate cancer Maternal Uncle        dx 63s    CURRENT MEDICATIONS:  Outpatient Encounter Medications as of 09/13/2018  Medication Sig  . abiraterone acetate (ZYTIGA) 250 MG tablet Take 4 tablets (1,000 mg total) by mouth daily. Take on an empty stomach 1 hour before or 2 hours after a meal  . Calcium Carb-Cholecalciferol (CALCIUM 500 + D3) 500-600 MG-UNIT TABS Take 2 tablets by mouth daily.  . ergocalciferol (VITAMIN D2) 50000 units capsule Take 1 capsule (50,000 Units total) by mouth every Friday.  . fentaNYL (DURAGESIC) 75 MCG/HR Place 1 patch onto the skin every 3 (three) days.  Marland Kitchen HYDROcodone-acetaminophen (NORCO) 10-325 MG tablet Take 1 tablet by mouth every 4 (four) hours as needed.  . lidocaine-prilocaine (EMLA) cream Apply a quarter size amount to port site 1 hour prior to chemo. Do not rub in. Cover with plastic wrap.  . lubiprostone (AMITIZA) 24 MCG capsule Take 1 capsule (24 mcg total) by mouth 2 (two) times daily with a meal.  . omeprazole (PRILOSEC) 20 MG capsule Take 1 capsule (20 mg total) by mouth daily. (Patient taking differently: Take 20 mg by mouth as needed. )  . predniSONE (DELTASONE) 5 MG tablet TAKE 1 TABLET BY MOUTH TWICE DAILY WITH A MEAL  . prochlorperazine (COMPAZINE) 10 MG tablet Take 1 tablet (10 mg total) by mouth every 6 (six) hours as needed for nausea or vomiting.  . rosuvastatin (CRESTOR) 20 MG tablet TAKE ONE TABLET BY MOUTH ONCE DAILY  . temazepam (RESTORIL) 15 MG capsule Take 1 capsule (15 mg total) by mouth at bedtime as needed for sleep.  . Wheat Dextrin (BENEFIBER PO) Take by mouth as needed.  . [DISCONTINUED] fentaNYL (DURAGESIC - DOSED MCG/HR) 75 MCG/HR Place 1 patch (75 mcg total) onto the skin every 3 (three) days.  . [DISCONTINUED] HYDROcodone-acetaminophen (NORCO) 10-325 MG tablet Take 1  tablet by mouth every 4 (four) hours as needed.  . [DISCONTINUED] HYDROcodone-acetaminophen (NORCO) 10-325 MG tablet Take 1 tablet by mouth every 4 (four) hours as needed.   No facility-administered  encounter medications on file as of 09/13/2018.     ALLERGIES:  Allergies  Allergen Reactions  . Meloxicam Other (See Comments)    Caused stomach bleeding.     PHYSICAL EXAM:  ECOG Performance status: 1  Vitals:   09/13/18 1000  BP: 120/83  Pulse: 95  Resp: 16  Temp: 97.6 F (36.4 C)  SpO2: 97%   Filed Weights   09/13/18 1000  Weight: 232 lb 7 oz (105.4 kg)    Physical Exam Constitutional:      Appearance: Normal appearance. He is normal weight.  Musculoskeletal: Normal range of motion.  Skin:    General: Skin is warm and dry.  Neurological:     Mental Status: He is alert and oriented to person, place, and time. Mental status is at baseline.  Psychiatric:        Mood and Affect: Mood normal.        Thought Content: Thought content normal.        Judgment: Judgment normal.      LABORATORY DATA:  I have reviewed the labs as listed.  CBC    Component Value Date/Time   WBC 4.2 08/23/2018 1310   RBC 4.84 08/23/2018 1310   HGB 14.7 08/23/2018 1310   HCT 44.6 08/23/2018 1310   PLT 181 08/23/2018 1310   MCV 92.1 08/23/2018 1310   MCH 30.4 08/23/2018 1310   MCHC 33.0 08/23/2018 1310   RDW 13.1 08/23/2018 1310   LYMPHSABS 1.1 08/23/2018 1310   MONOABS 0.4 08/23/2018 1310   EOSABS 0.1 08/23/2018 1310   BASOSABS 0.0 08/23/2018 1310   CMP Latest Ref Rng & Units 08/23/2018 07/26/2018 06/21/2018  Glucose 70 - 99 mg/dL 111(H) 104(H) 115(H)  BUN 8 - 23 mg/dL _0 Creatinine 0.61 - 1.24 mg/dL 1.03 1.03 1.07  Sodium 135 - 145 mmol/L 138 139 138  Potassium 3.5 - 5.1 mmol/L 4.2 4.0 3.6  Chloride 98 - 111 mmol/L 107 109 104  CO2 22 - 32 mmol/L _1 Calcium 8.9 - 10.3 mg/dL 8.7(L) 9.3 8.8(L)  Total Protein 6.5 - 8.1 g/dL 7.1 6.5 7.0  Total Bilirubin 0.3 - 1.2  mg/dL 0.4 0.6 0.6  Alkaline Phos 38 - 126 U/L 67 54 63  AST 15 - 41 U/L 13(L) 11(L) 15  ALT 0 - 44 U/L _2 DIAGNOSTIC IMAGING:  I have independently reviewed the scans and discussed with the patient.   I have reviewed Francene Finders, NP's note and agree with the documentation.  I personally performed a face-to-face visit, made revisions and my assessment and plan is as follows.    ASSESSMENT & PLAN:   Prostate carcinoma ([place]) 1.  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 -Recent rising PSA levels, patient maintained on [name], last injection 22.5 mg on 10/02/2017 -Most recent PSA of 7, CT scan (11/17/2017) and bone scan (11/13/2017) showing slight worsening of bone metastasis, no evidence of visceral metastasis - Abiraterone thousand milligrams daily with prednisone 5 mg twice daily started on 12/02/2017.  He had some mild worsening of back pain since Abiraterone. - Abiraterone was cut back to 750 mg on 01/29/2018.  PSA on 03/04/2018 went up to 2.48.  -Abiraterone was increased back to thousand milligrams daily on 03/04/2018. - Last PSA on 08/23/2018 increased to 7.75.   - Germline mutation testing  showed heterozygosity for MUTYH.  No BRCA related mutations noted. -I reviewed the results of the bone scan dated 09/09/2018 which showed multiple sites of metastatic disease with new focus of increased uptake at approximately T10 vertebral body. -CT of the abdomen and pelvis on 09/09/2018 did not show any visceral metastasis. - I have recommended change in therapy at this time.  We discussed the options including radium-223 and its side effects.  Patient is reluctant to travel to [place] long to get this treatment.  We also talked about the other option of cabazitaxel at a decreased dose of 15 mg/m, with the intention to increase it to 20 mg/m until further  progression.  We discussed the side effects of cabazitaxel in detail.  He has some residual tingling and numbness in the feet from prior docetaxel. -Last [name] injection was on 06/28/2018, 22.5 mg.  We will plan to discontinue Abiraterone after he completes the bottle. -He would like to bring his daughter with him next Monday when she is off work.  We will decide at that time.  2.  Prostate cancer related bone pain: -She has pain in the mid back region and left hip. -He is using fentanyl 75 mcg patch.  He continues taking hydrocodone 5 mg up to 3 to 4 tablets/day for breakthrough pain.  3.  Bone metastasis: -He will continue calcium supplements.  He will continue Xgeva monthly.  4.  Family history: - Maternal grandmother had stomach cancer.  Sister had stomach cancer.  Brother had some type of cancer. -He underwent genetic testing on 08/14/2018.  Germline mutation testing was negative.      Orders placed this encounter:  No orders of the defined types were placed in this encounter.     Derek Jack, MD [place] [phone]

## 2018-09-13 NOTE — Patient Instructions (Signed)
[facility] at [facility] Discharge Instructions     Thank you for choosing [facility] at [facility] to provide your oncology and hematology care.  To afford each patient quality time with our provider, please arrive at least 15 minutes before your scheduled appointment time.   If you have a lab appointment with the Cancer Center please come in thru the  Main Entrance and check in at the main information desk  You need to re-schedule your appointment should you arrive 10 or more minutes late.  We strive to give you quality time with our providers, and arriving late affects you and other patients whose appointments are after yours.  Also, if you no show three or more times for appointments you may be dismissed from the clinic at the providers discretion.     Again, thank you for choosing [facility].  Our hope is that these requests will decrease the amount of time that you wait before being seen by our physicians.       _____________________________________________________________  Should you have questions after your visit to [facility], please contact our office at [phone] between the hours of 8:00 a.m. and 4:30 p.m.  Voicemails left after 4:00 p.m. will not be returned until the following business day.  For prescription refill requests, have your pharmacy contact our office and allow 72 hours.    Cancer Center Support Programs:   > Cancer Support Group  2nd Tuesday of the month 1pm-2pm, Journey Room    

## 2018-09-16 ENCOUNTER — Telehealth (HOSPITAL_COMMUNITY): Payer: Self-pay | Admitting: Hematology

## 2018-09-16 NOTE — Telephone Encounter (Signed)
MAILED ITEMIZED BILLS TO [facility]

## 2018-09-20 ENCOUNTER — Encounter (HOSPITAL_COMMUNITY): Payer: Self-pay | Admitting: Hematology

## 2018-09-20 ENCOUNTER — Inpatient Hospital Stay (HOSPITAL_COMMUNITY): Payer: Medicare Other | Admitting: Hematology

## 2018-09-20 ENCOUNTER — Other Ambulatory Visit: Payer: Self-pay

## 2018-09-20 ENCOUNTER — Encounter (HOSPITAL_COMMUNITY): Payer: Self-pay | Admitting: Emergency Medicine

## 2018-09-20 VITALS — BP 133/82 | HR 102 | Temp 98.2°F | Resp 16 | Wt 233.0 lb

## 2018-09-20 DIAGNOSIS — Z8042 Family history of malignant neoplasm of prostate: Secondary | ICD-10-CM

## 2018-09-20 DIAGNOSIS — Z800 Family history of malignant neoplasm of digestive organs: Secondary | ICD-10-CM | POA: Diagnosis not present

## 2018-09-20 DIAGNOSIS — G893 Neoplasm related pain (acute) (chronic): Secondary | ICD-10-CM | POA: Diagnosis not present

## 2018-09-20 DIAGNOSIS — F17210 Nicotine dependence, cigarettes, uncomplicated: Secondary | ICD-10-CM

## 2018-09-20 DIAGNOSIS — Z5111 Encounter for antineoplastic chemotherapy: Secondary | ICD-10-CM | POA: Diagnosis not present

## 2018-09-20 DIAGNOSIS — C61 Malignant neoplasm of prostate: Secondary | ICD-10-CM | POA: Diagnosis not present

## 2018-09-20 DIAGNOSIS — Z5189 Encounter for other specified aftercare: Secondary | ICD-10-CM | POA: Diagnosis not present

## 2018-09-20 DIAGNOSIS — Z7189 Other specified counseling: Secondary | ICD-10-CM | POA: Insufficient documentation

## 2018-09-20 DIAGNOSIS — C7951 Secondary malignant neoplasm of bone: Secondary | ICD-10-CM

## 2018-09-20 NOTE — Progress Notes (Signed)
START ON PATHWAY REGIMEN - Prostate     A cycle is every 21 days.:     Cabazitaxel      Prednisone   **Always confirm dose/schedule in your pharmacy ordering system**  Patient Characteristics: Adenocarcinoma, Distant Metastases, Castration Resistant, Symptomatic, Prior Docetaxel/Docetaxel Ineligible Histology: Adenocarcinoma Therapeutic Status: Distant Metastases  Intent of Therapy: Non-Curative / Palliative Intent, Discussed with Patient 

## 2018-09-20 NOTE — Progress Notes (Signed)
[place],  [id number]   CLINIC:  Medical Oncology/Hematology  PCP:  [name], [place] [address] [phone]   REASON FOR VISIT: Follow-up for prostate cancer  CURRENT THERAPY:Zytiga and prednisone    BRIEF ONCOLOGIC HISTORY:    Prostate carcinoma ([place])   12/10/2015 Imaging    CTA chest, multiple thoracic and rib osseous lesions. no primary evident. Atherosclerosis including aortic and CAD    12/12/2015 Imaging    CT abdomen/pelvis with sclerotic osseous lesions througout the lumbar spine, sacrum, bilateral iliac bones and L proximal femur worrisome for sclerotic osseous mets,mild prostatomegaly, no LAD, Infrarenal 3.8 cm AAA    12/12/2015 Tumor Marker    PSA 153.85    12/18/2015 Imaging    Bone Scan Diffuse bony metastatic disease, posterior calvarium, sternum, thoracic, lumbar spine, bilateral ribs, L shoulder, bilateral bony pelvis, proximal L femur    12/24/2015 Initial Biopsy    CT biopsy of L iliac bone lesion    12/26/2015 Pathology Results    Bone, biopsy, left iliac - POSITIVE FOR METASTATIC ADENOCARCINOMA.    12/28/2015 - 09/01/2016 Chemotherapy    Firmagon instituted 240 mg     01/01/2016 Procedure    Port placed by IR.    01/07/2016 - 04/11/2016 Chemotherapy    Docetaxel every 21 days x 6 cycles with dose reductions due to tolerance    02/18/2016 Adverse Reaction    GI toxicity from docetaxel, dose reduced to 60 mg/m2    02/18/2016 Treatment Plan Change    Docetaxel dose reduced by 20%    03/06/2016 Procedure    Dr. Enrique Sack- Multiple extraction of tooth numbers 3, 5, 6, 8, 9, 10, 11, 12, 20, 21, 22, 27, 28, and 31. 4 Quadrants of alveoloplasty. Bilateral mandibular lingual tori reductions.    04/09/2016 Imaging    Brain MRI 1. No intracranial metastatic disease 2. No acute intracranial abnormality 3. Findings of chronic microvascular disease    05/14/2016 Imaging    CT C/A/P Stable  appearing diffuse bony metastatic disease. No new significant disease is seen Innumerable patchy sclerotic osseous metastases throughout the axial and proximal appendicular skeleton, stable in size and distribution, significantly increased in density in the interval, likely reflecting treatment effect. 2. No new or progressive metastatic disease in the chest, abdomen or pelvis. 3. Bilateral perifissural pulmonary nodules are stable and probably benign. 4. Aortic atherosclerosis. Infrarenal 4.3 cm abdominal aortic aneurysm, minimally increased in size. Recommend followup by ultrasound in 1 year.    06/02/2016 Tumor Marker    PSA 0.99 ng/ml     - 07/31/2016 Radiation Therapy    Palliative XRT by Dr. Lianne Cure in [place].     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  The Multi-Gene Panel offered by [facility] includes sequencing and/or deletion duplication testing of the following 85 genes: AIP, ALK, APC, ATM, AXIN2,BAP1,  BARD1, BLM, BMPR1A, BRCA1, BRCA2, BRIP1, CASR, CDC73, CDH1, CDK4, CDKN1B, CDKN1C, CDKN2A (p14ARF), CDKN2A (p16INK4a), CEBPA, CHEK2, CTNNA1, DICER1, DIS3L2, EGFR (c.2369C>T, p.Thr790Met variant only), EPCAM (Deletion/duplication testing only), FH, FLCN, GATA2, GPC3, GREM1 (Promoter region deletion/duplication testing only), HOXB13 (c.251G>A, p.Gly84Glu), HRAS, KIT, MAX, MEN1, MET, MITF (c.952G>A, p.Glu318Lys variant only), MLH1, MSH2, MSH3, MSH6, MUTYH, NBN, NF1, NF2, NTHL1, PALB2, PDGFRA, PHOX2B, PMS2, POLD1, POLE, POT1, PRKAR1A, PTCH1, PTEN, RAD50, RAD51C,  RAD51D, RB1, RECQL4, RET, RNF43, RUNX1, SDHAF2, SDHA (sequence changes only), SDHB, SDHC, SDHD, SMAD4, SMARCA4, SMARCB1, SMARCE1, STK11, SUFU, TERC, TERT, TMEM127, TP53, TSC1, TSC2, VHL, WRN and WT1.  The report date is August 24, 2018  This is not thought to increase the risk for colon cancer or  other cancers, unless there is the presence of two pathogenic mutations.    09/27/2018 -  Chemotherapy    The patient had pegfilgrastim-cbqv (UDENYCA) injection 6 mg, 6 mg, Subcutaneous, Once, 0 of 4 cycles cabazitaxel (JEVTANA) 35 mg in dextrose 5 % 250 mL chemo infusion, 15 mg/m2 = 35 mg (original dose ), Intravenous,  Once, 0 of 4 cycles Dose modification: 15 mg/m2 (Cycle 1, Reason: Provider Judgment)  for chemotherapy treatment.      Bone metastases ([place])   12/26/2015 Initial Diagnosis    Bone metastases ([place])    09/27/2018 -  Chemotherapy    The patient had pegfilgrastim-cbqv (UDENYCA) injection 6 mg, 6 mg, Subcutaneous, Once, 0 of 4 cycles cabazitaxel (JEVTANA) 35 mg in dextrose 5 % 250 mL chemo infusion, 15 mg/m2 = 35 mg (original dose ), Intravenous,  Once, 0 of 4 cycles Dose modification: 15 mg/m2 (Cycle 1, Reason: Provider Judgment)  for chemotherapy treatment.       CANCER STAGING: Cancer Staging Prostate carcinoma [facility]) Staging form: Prostate, AJCC 7th Edition - Clinical stage from 12/24/2015: Stage IV (TX, N0, M1b, PSA: 20 or greater) - Signed by Baird Cancer, PA-C on 01/07/2016    INTERVAL HISTORY:  Mr. Springs 63 y.o. male returns for routine follow-up for prostate cancer. He is here today with his daughter. He has been feeling bad. His back is hurting him and the pain is worse. He also has numbness in his toes this is stable at this time. Denies any nausea, vomiting, or diarrhea. Denies any new pains. Had not noticed any recent bleeding such as epistaxis, hematuria or hematochezia. Denies recent chest pain on exertion, shortness of breath on minimal exertion, pre-syncopal episodes, or palpitations. Denies any numbness or tingling in hands or feet. Denies any recent fevers, infections, or recent hospitalizations. Patient reports appetite at 50% and energy level at 50%. He is maintaining his weight at this time.    REVIEW OF SYSTEMS:  Review of Systems  Constitutional:  Positive for fatigue.  Musculoskeletal: Positive for back pain.  Neurological: Positive for numbness.  All other systems reviewed and are negative.    PAST MEDICAL/SURGICAL HISTORY:  Past Medical History:  Diagnosis Date  . Arthritis    Left knee  . Bone cancer ([place])   . Bone metastases ([place]) 12/26/2015  . Cancer [facility])    Prostate  . DVT (deep venous thrombosis) ([place]) 2004  . Family history of prostate cancer   . Family history of stomach cancer   . Kidney stones    ~2002  . Prostate carcinoma ([place]) 12/26/2015   Past Surgical History:  Procedure Laterality Date  . CIRCUMCISION  30 years ago  . MULTIPLE EXTRACTIONS WITH ALVEOLOPLASTY N/A 03/06/2016   Procedure: Extraction of tooth #'s 3,5,6,8-12,20-22, 27, 28, and 31 with alveoloplasty and bilateral mandibular tori reductions;  Surgeon: Lenn Cal, DDS;  Location: [facility];  Service: Oral Surgery;  Laterality: N/A;     SOCIAL HISTORY:  Social History   Socioeconomic History  . Marital status: Single    Spouse name: Not on file  . Number of children: 2  . Years of education: Not on file  . Highest education  level: Not on file  Occupational History  . Occupation: Research scientist (life sciences)  Social Needs  . Financial resource strain: Not on file  . Food insecurity:    Worry: Not on file    Inability: Not on file  . Transportation needs:    Medical: Not on file    Non-medical: Not on file  Tobacco Use  . Smoking status: Current Every Day Smoker    Packs/day: 1.00    Years: 50.00    Pack years: 50.00    Types: Cigarettes    Start date: 06/16/1965  . Smokeless tobacco: Former Systems developer    Types: [place] date: 08/04/2005  Substance and Sexual Activity  . Alcohol use: No    Alcohol/week: 0.0 standard drinks  . Drug use: Yes    Types: Marijuana    Comment: THC for appetite; occ  . Sexual activity: Not on file  Lifestyle  . Physical activity:    Days per week: Not on file    Minutes per session: Not on file  .  Stress: Not on file  Relationships  . Social connections:    Talks on phone: Not on file    Gets together: Not on file    Attends religious service: Not on file    Active member of club or organization: Not on file    Attends meetings of clubs or organizations: Not on file    Relationship status: Not on file  . Intimate partner violence:    Fear of current or ex partner: Not on file    Emotionally abused: Not on file    Physically abused: Not on file    Forced sexual activity: Not on file  Other Topics Concern  . Not on file  Social History Narrative  . Not on file    FAMILY HISTORY:  Family History  Problem Relation Age of Onset  . Stomach cancer Mother        dx 57s  . Aneurysm Maternal Aunt        brain  . Alcohol abuse Maternal Uncle   . Stomach cancer Maternal Grandmother        dx 50s-60s  . Prostate cancer Maternal Uncle        dx 11s    CURRENT MEDICATIONS:  Outpatient Encounter Medications as of 09/20/2018  Medication Sig  . abiraterone acetate (ZYTIGA) 250 MG tablet Take 4 tablets (1,000 mg total) by mouth daily. Take on an empty stomach 1 hour before or 2 hours after a meal  . Calcium Carb-Cholecalciferol (CALCIUM 500 + D3) 500-600 MG-UNIT TABS Take 2 tablets by mouth daily.  . ergocalciferol (VITAMIN D2) 50000 units capsule Take 1 capsule (50,000 Units total) by mouth every Friday.  . fentaNYL (DURAGESIC) 75 MCG/HR Place 1 patch onto the skin every 3 (three) days.  Marland Kitchen HYDROcodone-acetaminophen (NORCO) 10-325 MG tablet Take 1 tablet by mouth every 4 (four) hours as needed.  . lidocaine-prilocaine (EMLA) cream Apply a quarter size amount to port site 1 hour prior to chemo. Do not rub in. Cover with plastic wrap.  . lubiprostone (AMITIZA) 24 MCG capsule Take 1 capsule (24 mcg total) by mouth 2 (two) times daily with a meal.  . omeprazole (PRILOSEC) 20 MG capsule Take 1 capsule (20 mg total) by mouth daily. (Patient taking differently: Take 20 mg by mouth as needed.  )  . predniSONE (DELTASONE) 5 MG tablet TAKE 1 TABLET BY MOUTH TWICE DAILY WITH A MEAL  . temazepam (RESTORIL)  15 MG capsule Take 1 capsule (15 mg total) by mouth at bedtime as needed for sleep.  . Wheat Dextrin (BENEFIBER PO) Take by mouth as needed.  . [DISCONTINUED] rosuvastatin (CRESTOR) 20 MG tablet TAKE ONE TABLET BY MOUTH ONCE DAILY  . prochlorperazine (COMPAZINE) 10 MG tablet Take 1 tablet (10 mg total) by mouth every 6 (six) hours as needed for nausea or vomiting. (Patient not taking: Reported on 09/20/2018)   No facility-administered encounter medications on file as of 09/20/2018.     ALLERGIES:  Allergies  Allergen Reactions  . Meloxicam Other (See Comments)    Caused stomach bleeding.     PHYSICAL EXAM:  ECOG Performance status: 1  Vitals:   09/20/18 1100  BP: 133/82  Pulse: (!) 102  Resp: 16  Temp: 98.2 F (36.8 C)  SpO2: 98%   Filed Weights   09/20/18 1100  Weight: 233 lb (105.7 kg)    Physical Exam Constitutional:      Appearance: Normal appearance. He is normal weight.  Musculoskeletal: Normal range of motion.  Skin:    General: Skin is warm and dry.  Neurological:     Mental Status: He is alert and oriented to person, place, and time. Mental status is at baseline.  Psychiatric:        Mood and Affect: Mood normal.        Behavior: Behavior normal.        Thought Content: Thought content normal.        Judgment: Judgment normal.      LABORATORY DATA:  I have reviewed the labs as listed.  CBC    Component Value Date/Time   WBC 4.2 08/23/2018 1310   RBC 4.84 08/23/2018 1310   HGB 14.7 08/23/2018 1310   HCT 44.6 08/23/2018 1310   PLT 181 08/23/2018 1310   MCV 92.1 08/23/2018 1310   MCH 30.4 08/23/2018 1310   MCHC 33.0 08/23/2018 1310   RDW 13.1 08/23/2018 1310   LYMPHSABS 1.1 08/23/2018 1310   MONOABS 0.4 08/23/2018 1310   EOSABS 0.1 08/23/2018 1310   BASOSABS 0.0 08/23/2018 1310   CMP Latest Ref Rng & Units 08/23/2018 07/26/2018  06/21/2018  Glucose 70 - 99 mg/dL 111(H) 104(H) 115(H)  BUN 8 - 23 mg/dL '12 10 9  ' Creatinine 0.61 - 1.24 mg/dL 1.03 1.03 1.07  Sodium 135 - 145 mmol/L 138 139 138  Potassium 3.5 - 5.1 mmol/L 4.2 4.0 3.6  Chloride 98 - 111 mmol/L 107 109 104  CO2 22 - 32 mmol/L '23 24 25  ' Calcium 8.9 - 10.3 mg/dL 8.7(L) 9.3 8.8(L)  Total Protein 6.5 - 8.1 g/dL 7.1 6.5 7.0  Total Bilirubin 0.3 - 1.2 mg/dL 0.4 0.6 0.6  Alkaline Phos 38 - 126 U/L 67 54 63  AST 15 - 41 U/L 13(L) 11(L) 15  ALT 0 - 44 U/L '11 11 10       ' DIAGNOSTIC IMAGING:  I have independently reviewed the scans and discussed with the patient.   I have reviewed Francene Finders, NP's note and agree with the documentation.  I personally performed a face-to-face visit, made revisions and my assessment and plan is as follows.    ASSESSMENT & PLAN:   Prostate carcinoma ([place]) 1.  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 -Recent rising PSA levels, patient maintained on [name], last injection 22.5 mg on 10/02/2017 -Most recent PSA of  7, CT scan (11/17/2017) and bone scan (11/13/2017) showing slight worsening of bone metastasis, no evidence of visceral metastasis - Abiraterone thousand milligrams daily with prednisone 5 mg twice daily started on 12/02/2017.  He had some mild worsening of back pain since Abiraterone. - Abiraterone was cut back to 750 mg on 01/29/2018.  PSA on 03/04/2018 went up to 2.48.  -Abiraterone was increased back to thousand milligrams daily on 03/04/2018. - Last PSA on 08/23/2018 increased to 7.75.   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  No BRCA related mutations noted. -I reviewed the results of the bone scan dated 09/09/2018 which showed multiple sites of metastatic disease with new focus of increased uptake at approximately T10 vertebral body. -CT of the abdomen and pelvis  on 09/09/2018 did not show any visceral metastasis. -Last [name] injection was on 06/28/2018, 22.5 mg. - We talked about options including radium 223 and chemotherapy with cabazitaxel.  We talked about the side effects of both regimens in detail.  He is wishing to do treatments locally with chemotherapy first.  We talked about the regimen of chemotherapy.  He had a tough time tolerating docetaxel after cycle 3 2 years ago.  Hence I will start at a lower dose of 15 mg/m and spaced out every 4 weeks.  We will use Neulasta because of prior history of radiation and chemotherapy.  We talked about worsening neuropathy in his feet. - He gives [place] permission to proceed with the treatment.  He already has a port.  We plan to start therapy as soon as we get authorization from his insurance.  I will see him back in 2 weeks after his first cycle to see how he is tolerating it. - He will continue [name] injections every 3 months.  2.  Prostate cancer related bone pain: -He has pain in the mid back region and left hip. -She is using fentanyl 75 mcg patch.  For the last 2 days he is requiring hydrocodone 5 mg 2 tablets up to 3 times a day.  3.  Bone metastasis: -She will continue Xgeva monthly.  He will also continue calcium supplements.  4.  Family history: - Maternal grandmother had stomach cancer.  Sister had stomach cancer.  Brother had some type of cancer. -He underwent genetic testing on 08/14/2018.  Germline mutation testing was negative.  Total time spent is 40 minutes with more than 50% of the time spent face-to-face discussing scan results, treatment recommendations, side effects and coordination of care.    Orders placed this encounter:  Orders Placed This Encounter  Procedures  . Magnesium  . CBC with Differential/Platelet  . Comprehensive metabolic panel      Derek Jack, MD [place] [phone]

## 2018-09-20 NOTE — Progress Notes (Signed)
Patients grandson here today with him. The mother of the child is here as well. Informed them that no children under the age of 88 are aloud in the hospital at this time due to the flu. Janace Aris mother refused to remove pt from the building. Documenting I made the aware of the risks and they still refused to leave.

## 2018-09-20 NOTE — Assessment & Plan Note (Signed)
1.  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 -Recent rising PSA levels, patient maintained on [name], last injection 22.5 mg on 10/02/2017 -Most recent PSA of 7, CT scan (11/17/2017) and bone scan (11/13/2017) showing slight worsening of bone metastasis, no evidence of visceral metastasis - Abiraterone thousand milligrams daily with prednisone 5 mg twice daily started on 12/02/2017.  He had some mild worsening of back pain since Abiraterone. - Abiraterone was cut back to 750 mg on 01/29/2018.  PSA on 03/04/2018 went up to 2.48.  -Abiraterone was increased back to thousand milligrams daily on 03/04/2018. - Last PSA on 08/23/2018 increased to 7.75.   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  No BRCA related mutations noted. -I reviewed the results of the bone scan dated 09/09/2018 which showed multiple sites of metastatic disease with new focus of increased uptake at approximately T10 vertebral body. -CT of the abdomen and pelvis on 09/09/2018 did not show any visceral metastasis. -Last [name] injection was on 06/28/2018, 22.5 mg. - We talked about options including radium 223 and chemotherapy with cabazitaxel.  We talked about the side effects of both regimens in detail.  He is wishing to do treatments locally with chemotherapy first.  We talked about the regimen of chemotherapy.  He had a tough time tolerating docetaxel after cycle 3 2 years ago.  Hence I will start at a lower dose of 15 mg/m and spaced out every 4 weeks.  We will use Neulasta because of prior history of radiation and chemotherapy.  We talked about worsening neuropathy in his feet. - He gives [place] permission to proceed with the treatment.  He already has a port.  We plan to start therapy as soon as we get authorization from his insurance.  I will see him back in 2 weeks after his first cycle to see  how he is tolerating it. - He will continue [name] injections every 3 months.  2.  Prostate cancer related bone pain: -He has pain in the mid back region and left hip. -She is using fentanyl 75 mcg patch.  For the last 2 days he is requiring hydrocodone 5 mg 2 tablets up to 3 times a day.  3.  Bone metastasis: -She will continue Xgeva monthly.  He will also continue calcium supplements.  4.  Family history: - Maternal grandmother had stomach cancer.  Sister had stomach cancer.  Brother had some type of cancer. -He underwent genetic testing on 08/14/2018.  Germline mutation testing was negative.

## 2018-09-20 NOTE — Patient Instructions (Signed)
[facility] Discharge Instructions     Thank you for choosing [facility] at [facility] to provide your oncology and hematology care.  To afford each patient quality time with our provider, please arrive at least 15 minutes before your scheduled appointment time.   If you have a lab appointment with the Cancer Center please come in thru the  Main Entrance and check in at the main information desk  You need to re-schedule your appointment should you arrive 10 or more minutes late.  We strive to give you quality time with our providers, and arriving late affects you and other patients whose appointments are after yours.  Also, if you no show three or more times for appointments you may be dismissed from the clinic at the providers discretion.     Again, thank you for choosing [facility].  Our hope is that these requests will decrease the amount of time that you wait before being seen by our physicians.       _____________________________________________________________  Should you have questions after your visit to [facility], please contact our office at [phone] between the hours of 8:00 a.m. and 4:30 p.m.  Voicemails left after 4:00 p.m. will not be returned until the following business day.  For prescription refill requests, have your pharmacy contact our office and allow 72 hours.    Cancer Center Support Programs:   > Cancer Support Group  2nd Tuesday of the month 1pm-2pm, Journey Room    

## 2018-09-21 ENCOUNTER — Inpatient Hospital Stay (HOSPITAL_COMMUNITY): Payer: Self-pay

## 2018-09-21 ENCOUNTER — Other Ambulatory Visit (HOSPITAL_COMMUNITY): Payer: Self-pay

## 2018-09-21 ENCOUNTER — Inpatient Hospital Stay (HOSPITAL_COMMUNITY): Payer: Medicare Other

## 2018-09-21 ENCOUNTER — Ambulatory Visit (HOSPITAL_COMMUNITY): Payer: Self-pay

## 2018-09-21 VITALS — BP 116/74 | HR 82 | Temp 98.1°F | Resp 16

## 2018-09-21 DIAGNOSIS — Z5111 Encounter for antineoplastic chemotherapy: Secondary | ICD-10-CM | POA: Diagnosis not present

## 2018-09-21 DIAGNOSIS — Z800 Family history of malignant neoplasm of digestive organs: Secondary | ICD-10-CM | POA: Diagnosis not present

## 2018-09-21 DIAGNOSIS — F17210 Nicotine dependence, cigarettes, uncomplicated: Secondary | ICD-10-CM | POA: Diagnosis not present

## 2018-09-21 DIAGNOSIS — C61 Malignant neoplasm of prostate: Secondary | ICD-10-CM

## 2018-09-21 DIAGNOSIS — Z5189 Encounter for other specified aftercare: Secondary | ICD-10-CM | POA: Diagnosis not present

## 2018-09-21 DIAGNOSIS — C7951 Secondary malignant neoplasm of bone: Secondary | ICD-10-CM | POA: Diagnosis not present

## 2018-09-21 DIAGNOSIS — Z8042 Family history of malignant neoplasm of prostate: Secondary | ICD-10-CM | POA: Diagnosis not present

## 2018-09-21 DIAGNOSIS — G893 Neoplasm related pain (acute) (chronic): Secondary | ICD-10-CM | POA: Diagnosis not present

## 2018-09-21 LAB — COMPREHENSIVE METABOLIC PANEL
ALT: 11 U/L (ref 0–44)
ANION GAP: 8 (ref 5–15)
AST: 14 U/L — ABNORMAL LOW (ref 15–41)
Albumin: 3.6 g/dL (ref 3.5–5.0)
Alkaline Phosphatase: 53 U/L (ref 38–126)
BUN: 14 mg/dL (ref 8–23)
CO2: 23 mmol/L (ref 22–32)
Calcium: 8.7 mg/dL — ABNORMAL LOW (ref 8.9–10.3)
Chloride: 107 mmol/L (ref 98–111)
Creatinine, Ser: 1.05 mg/dL (ref 0.61–1.24)
GFR calc Af Amer: >60 mL/min (ref >60)
GFR calc non Af Amer: >60 mL/min (ref >60)
GLUCOSE: 135 mg/dL — AB (ref 70–99)
Potassium: 4.2 mmol/L (ref 3.5–5.1)
Sodium: 138 mmol/L (ref 135–145)
Total Bilirubin: 0.6 mg/dL (ref 0.3–1.2)
Total Protein: 6.5 g/dL (ref 6.5–8.1)

## 2018-09-21 LAB — CBC WITH DIFFERENTIAL/PLATELET
Abs Immature Granulocytes: 0.01 10*3/uL (ref 0.00–0.07)
Basophils Absolute: 0 10*3/uL (ref 0.0–0.1)
Basophils Relative: 0 %
EOS PCT: 3 %
Eosinophils Absolute: 0.1 10*3/uL (ref 0.0–0.5)
HCT: 41.3 % (ref 39.0–52.0)
Hemoglobin: 13.5 g/dL (ref 13.0–17.0)
Immature Granulocytes: 0 %
Lymphocytes Relative: 16 %
Lymphs Abs: 0.7 10*3/uL (ref 0.7–4.0)
MCH: 29.7 pg (ref 26.0–34.0)
MCHC: 32.7 g/dL (ref 30.0–36.0)
MCV: 91 fL (ref 80.0–100.0)
MONOS PCT: 7 %
Monocytes Absolute: 0.3 10*3/uL (ref 0.1–1.0)
Neutro Abs: 3.4 10*3/uL (ref 1.7–7.7)
Neutrophils Relative %: 74 %
Platelets: 135 10*3/uL — ABNORMAL LOW (ref 150–400)
RBC: 4.54 MIL/uL (ref 4.22–5.81)
RDW: 13.5 % (ref 11.5–15.5)
WBC: 4.6 10*3/uL (ref 4.0–10.5)
nRBC: 0 % (ref 0.0–0.2)

## 2018-09-21 LAB — MAGNESIUM: MAGNESIUM: 2.1 mg/dL (ref 1.7–2.4)

## 2018-09-21 MED ORDER — LEUPROLIDE ACETATE (3 MONTH) 22.5 MG IM KIT
22.5000 mg | PACK | Freq: Once | INTRAMUSCULAR | Status: AC
  Administered 2018-09-21: 22.5 mg via INTRAMUSCULAR
  Filled 2018-09-21: qty 22.5

## 2018-09-21 MED ORDER — DENOSUMAB 120 MG/1.7ML ~~LOC~~ SOLN
120.0000 mg | Freq: Once | SUBCUTANEOUS | Status: AC
  Administered 2018-09-21: 120 mg via SUBCUTANEOUS
  Filled 2018-09-21: qty 1.7

## 2018-09-21 NOTE — Progress Notes (Signed)
Matthew [name] tolerated Xgeva and [name] without incident or complaint. VSS. Denies jaw/tooth pain. Reports he is taking his calcium and vit D as directed. Ca 8.7. Discharged self ambulatory in satisfactory condition.

## 2018-09-21 NOTE — Patient Instructions (Signed)
[facility] _______________________________________________________________  Thank you for choosing [facility] at [facility] to provide your oncology and hematology care.  To afford each patient quality time with our providers, please arrive at least 15 minutes before your scheduled appointment.  You need to re-schedule your appointment if you arrive 10 or more minutes late.  We strive to give you quality time with our providers, and arriving late affects you and other patients whose appointments are after yours.  Also, if you no show three or more times for appointments you may be dismissed from the clinic.  Again, thank you for choosing [facility] at [facility]. Our hope is that these requests will allow you access to exceptional care and in a timely manner. _______________________________________________________________  If you have questions after your visit, please contact our office at [phone] between the hours of 8:30 a.m. and 5:00 p.m. Voicemails left after 4:30 p.m. will not be returned until the following business day. _______________________________________________________________  For prescription refill requests, have your pharmacy contact our office. _______________________________________________________________  Recommendations made by the consultant and any test results will be sent to your referring physician. _______________________________________________________________ 

## 2018-09-22 ENCOUNTER — Other Ambulatory Visit: Payer: Self-pay | Admitting: Internal Medicine

## 2018-09-23 ENCOUNTER — Other Ambulatory Visit (HOSPITAL_COMMUNITY): Payer: Self-pay | Admitting: Hematology

## 2018-09-23 DIAGNOSIS — C61 Malignant neoplasm of prostate: Secondary | ICD-10-CM

## 2018-09-24 ENCOUNTER — Other Ambulatory Visit (HOSPITAL_COMMUNITY): Payer: Self-pay | Admitting: *Deleted

## 2018-09-24 ENCOUNTER — Other Ambulatory Visit (HOSPITAL_COMMUNITY): Payer: Self-pay | Admitting: Hematology

## 2018-09-24 DIAGNOSIS — C61 Malignant neoplasm of prostate: Secondary | ICD-10-CM

## 2018-09-24 DIAGNOSIS — C7951 Secondary malignant neoplasm of bone: Secondary | ICD-10-CM

## 2018-09-24 MED ORDER — ABIRATERONE ACETATE 250 MG PO TABS: 1000.0000 mg | ORAL_TABLET | Freq: Every day | ORAL | 0 refills | Status: DC

## 2018-09-24 MED ORDER — ONDANSETRON HCL 8 MG PO TABS: ORAL_TABLET | ORAL | 1 refills | Status: DC

## 2018-09-24 MED ORDER — PROCHLORPERAZINE MALEATE 10 MG PO TABS: 10.0000 mg | ORAL_TABLET | Freq: Four times a day (QID) | ORAL | 1 refills | Status: DC | PRN

## 2018-09-24 NOTE — Telephone Encounter (Signed)
Chart reviewed, zytiga not refilled, pharmacy notified.  Patient is changing therapy.

## 2018-09-27 ENCOUNTER — Inpatient Hospital Stay (HOSPITAL_COMMUNITY): Payer: Medicare Other | Attending: Hematology

## 2018-09-27 ENCOUNTER — Encounter (HOSPITAL_COMMUNITY): Payer: Self-pay

## 2018-09-27 VITALS — BP 114/69 | HR 81 | Temp 98.4°F | Resp 18 | Wt 232.8 lb

## 2018-09-27 DIAGNOSIS — C7951 Secondary malignant neoplasm of bone: Secondary | ICD-10-CM | POA: Diagnosis not present

## 2018-09-27 DIAGNOSIS — C61 Malignant neoplasm of prostate: Secondary | ICD-10-CM | POA: Insufficient documentation

## 2018-09-27 DIAGNOSIS — Z5111 Encounter for antineoplastic chemotherapy: Secondary | ICD-10-CM | POA: Insufficient documentation

## 2018-09-27 MED ORDER — LIDOCAINE-PRILOCAINE 2.5-2.5 % EX CREA: TOPICAL_CREAM | CUTANEOUS | 3 refills | Status: DC

## 2018-09-27 MED ORDER — PROCHLORPERAZINE MALEATE 10 MG PO TABS: 10.0000 mg | ORAL_TABLET | Freq: Four times a day (QID) | ORAL | 1 refills | Status: DC | PRN

## 2018-09-27 MED ORDER — HEPARIN SOD (PORK) LOCK FLUSH 100 UNIT/ML IV SOLN
500.0000 [IU] | Freq: Once | INTRAVENOUS | Status: AC | PRN
  Administered 2018-09-27: 500 [IU]

## 2018-09-27 MED ORDER — FAMOTIDINE IN NACL 20-0.9 MG/50ML-% IV SOLN
20.0000 mg | Freq: Once | INTRAVENOUS | Status: AC
  Administered 2018-09-27: 20 mg via INTRAVENOUS
  Filled 2018-09-27: qty 50

## 2018-09-27 MED ORDER — PREDNISONE 10 MG PO TABS: 10.0000 mg | ORAL_TABLET | Freq: Every day | ORAL | 3 refills | Status: DC

## 2018-09-27 MED ORDER — SODIUM CHLORIDE 0.9 % IV SOLN
10.0000 mg | Freq: Once | INTRAVENOUS | Status: AC
  Administered 2018-09-27: 10 mg via INTRAVENOUS
  Filled 2018-09-27: qty 1

## 2018-09-27 MED ORDER — DEXTROSE 5 % IV SOLN
15.0000 mg/m2 | Freq: Once | INTRAVENOUS | Status: AC
  Administered 2018-09-27: 35 mg via INTRAVENOUS
  Filled 2018-09-27: qty 3.5

## 2018-09-27 MED ORDER — SODIUM CHLORIDE 0.9% FLUSH
10.0000 mL | INTRAVENOUS | Status: DC | PRN
  Administered 2018-09-27: 10 mL
  Filled 2018-09-27: qty 10

## 2018-09-27 MED ORDER — SODIUM CHLORIDE 0.9 % IV SOLN
Freq: Once | INTRAVENOUS | Status: AC
  Administered 2018-09-27: 11:00:00 via INTRAVENOUS

## 2018-09-27 MED ORDER — DIPHENHYDRAMINE HCL 50 MG/ML IJ SOLN
25.0000 mg | Freq: Once | INTRAMUSCULAR | Status: AC
  Administered 2018-09-27: 25 mg via INTRAVENOUS
  Filled 2018-09-27: qty 1

## 2018-09-27 MED ORDER — PALONOSETRON HCL INJECTION 0.25 MG/5ML
0.2500 mg | Freq: Once | INTRAVENOUS | Status: AC
  Administered 2018-09-27: 0.25 mg via INTRAVENOUS
  Filled 2018-09-27: qty 5

## 2018-09-27 NOTE — Progress Notes (Signed)
Patient to treatment area for [place].  Consent signed.  All questions asked and answered.  Family at side.  Patient stated he has a "piece of bone" sticking out from the inner gumline on the left lower side and felt like he has "blisters" and stinging on the right side of his mouth.  Area checked with a small piece of bone sticking out but no drainage or complaints of pain at site.  No blisters noted on the right lower gumline.  Dr. Walden Field notified and checked by the oncologist.  Madaline Brilliant to treat today and to schedule a dental visit with Dr. Drexel Iha verbal order Dr. Walden Field.  Note sent to the front schedulers for dental appointment.   Patient tolerated infusion with no complaints voiced.  Port site clean and dry with good blood return noted before and after treatment.  Band aid applied.  VSS with discharge and left ambulatory with no s/s of distress noted.  Family at side.

## 2018-09-27 NOTE — Patient Instructions (Signed)
[facility] Discharge Instructions for Patients Receiving Chemotherapy  Today you received the following chemotherapy agents  If you develop nausea and vomiting that is not controlled by your nausea medication, call the clinic.   BELOW ARE SYMPTOMS THAT SHOULD BE REPORTED IMMEDIATELY:  *FEVER GREATER THAN 100.5 F  *CHILLS WITH OR WITHOUT FEVER  NAUSEA AND VOMITING THAT IS NOT CONTROLLED WITH YOUR NAUSEA MEDICATION  *UNUSUAL SHORTNESS OF BREATH  *UNUSUAL BRUISING OR BLEEDING  TENDERNESS IN MOUTH AND THROAT WITH OR WITHOUT PRESENCE OF ULCERS  *URINARY PROBLEMS  *BOWEL PROBLEMS  UNUSUAL RASH Items with * indicate a potential emergency and should be followed up as soon as possible.  Feel free to call the clinic should you have any questions or concerns. The clinic phone number is [phone].  Please show the CHEMO ALERT CARD at check-in to the Emergency Department and triage nurse.   

## 2018-09-28 ENCOUNTER — Telehealth (HOSPITAL_COMMUNITY): Payer: Self-pay

## 2018-09-28 NOTE — Telephone Encounter (Signed)
Called patients daughter Matthew [name] on her number and left message for her to call back to get Matthew [name] scheduled for an appointment with Dental Medicine. Molli Posey

## 2018-09-29 ENCOUNTER — Encounter (HOSPITAL_COMMUNITY): Payer: Self-pay

## 2018-09-29 ENCOUNTER — Other Ambulatory Visit (HOSPITAL_COMMUNITY): Payer: Self-pay | Admitting: *Deleted

## 2018-09-29 ENCOUNTER — Other Ambulatory Visit: Payer: Self-pay | Admitting: Internal Medicine

## 2018-09-29 ENCOUNTER — Inpatient Hospital Stay (HOSPITAL_COMMUNITY): Payer: Medicare Other

## 2018-09-29 VITALS — BP 125/77 | HR 97 | Temp 97.9°F | Resp 18

## 2018-09-29 DIAGNOSIS — Z800 Family history of malignant neoplasm of digestive organs: Secondary | ICD-10-CM | POA: Diagnosis not present

## 2018-09-29 DIAGNOSIS — F17210 Nicotine dependence, cigarettes, uncomplicated: Secondary | ICD-10-CM | POA: Diagnosis not present

## 2018-09-29 DIAGNOSIS — Z5111 Encounter for antineoplastic chemotherapy: Secondary | ICD-10-CM | POA: Diagnosis not present

## 2018-09-29 DIAGNOSIS — C61 Malignant neoplasm of prostate: Secondary | ICD-10-CM | POA: Diagnosis not present

## 2018-09-29 DIAGNOSIS — Z8042 Family history of malignant neoplasm of prostate: Secondary | ICD-10-CM | POA: Diagnosis not present

## 2018-09-29 DIAGNOSIS — G893 Neoplasm related pain (acute) (chronic): Secondary | ICD-10-CM | POA: Diagnosis not present

## 2018-09-29 DIAGNOSIS — C7951 Secondary malignant neoplasm of bone: Secondary | ICD-10-CM

## 2018-09-29 DIAGNOSIS — Z5189 Encounter for other specified aftercare: Secondary | ICD-10-CM | POA: Diagnosis not present

## 2018-09-29 MED ORDER — PEGFILGRASTIM-CBQV 6 MG/0.6ML ~~LOC~~ SOSY
PREFILLED_SYRINGE | SUBCUTANEOUS | Status: AC
  Filled 2018-09-29: qty 0.6

## 2018-09-29 MED ORDER — PEGFILGRASTIM-CBQV 6 MG/0.6ML ~~LOC~~ SOSY
6.0000 mg | PREFILLED_SYRINGE | Freq: Once | SUBCUTANEOUS | Status: AC
  Administered 2018-09-29: 6 mg via SUBCUTANEOUS

## 2018-09-29 NOTE — Progress Notes (Signed)
Matthew [name] tolerated Udenyca injection well without complaints or incident. VSS. Pt reports feeling fatigued since receiving his first Jevtana infusion this past Monday but denies any other issues today.Pt instructed to call for any problems and verbalized understanding. Pt discharged self ambulatory in satisfactory condition

## 2018-09-29 NOTE — Patient Instructions (Signed)
[facility] at [facility] Discharge Instructions  Received Udenyca injection today. Follow-up as scheduled. Call clinic for any questions or concerns   Thank you for choosing [facility] at [facility] to provide your oncology and hematology care.  To afford each patient quality time with our provider, please arrive at least 15 minutes before your scheduled appointment time.   If you have a lab appointment with the Cancer Center please come in thru the  Main Entrance and check in at the main information desk  You need to re-schedule your appointment should you arrive 10 or more minutes late.  We strive to give you quality time with our providers, and arriving late affects you and other patients whose appointments are after yours.  Also, if you no show three or more times for appointments you may be dismissed from the clinic at the providers discretion.     Again, thank you for choosing [facility].  Our hope is that these requests will decrease the amount of time that you wait before being seen by our physicians.       _____________________________________________________________  Should you have questions after your visit to  Cancer Center, please contact our office at [phone] between the hours of 8:00 a.m. and 4:30 p.m.  Voicemails left after 4:00 p.m. will not be returned until the following business day.  For prescription refill requests, have your pharmacy contact our office and allow 72 hours.    Cancer Center Support Programs:   > Cancer Support Group  2nd Tuesday of the month 1pm-2pm, Journey Room   

## 2018-10-01 ENCOUNTER — Other Ambulatory Visit (HOSPITAL_COMMUNITY): Payer: Self-pay | Admitting: Nurse Practitioner

## 2018-10-04 ENCOUNTER — Encounter (HOSPITAL_COMMUNITY): Payer: Self-pay | Admitting: Dentistry

## 2018-10-04 ENCOUNTER — Ambulatory Visit (HOSPITAL_COMMUNITY): Payer: Self-pay | Admitting: Dentistry

## 2018-10-04 ENCOUNTER — Other Ambulatory Visit (HOSPITAL_COMMUNITY): Payer: Self-pay | Admitting: Nurse Practitioner

## 2018-10-04 VITALS — BP 128/79 | HR 84 | Temp 97.8°F

## 2018-10-04 DIAGNOSIS — C61 Malignant neoplasm of prostate: Secondary | ICD-10-CM

## 2018-10-04 DIAGNOSIS — K08109 Complete loss of teeth, unspecified cause, unspecified class: Secondary | ICD-10-CM

## 2018-10-04 DIAGNOSIS — R29898 Other symptoms and signs involving the musculoskeletal system: Secondary | ICD-10-CM

## 2018-10-04 DIAGNOSIS — C7951 Secondary malignant neoplasm of bone: Secondary | ICD-10-CM

## 2018-10-04 DIAGNOSIS — K0820 Unspecified atrophy of edentulous alveolar ridge: Secondary | ICD-10-CM

## 2018-10-04 DIAGNOSIS — Z972 Presence of dental prosthetic device (complete) (partial): Secondary | ICD-10-CM

## 2018-10-04 MED ORDER — CHLORHEXIDINE GLUCONATE 0.12 % MT SOLN: OROMUCOSAL | 99 refills | Status: DC

## 2018-10-04 NOTE — Patient Instructions (Signed)
Patient is to use the chlorhexidine rinses twice daily as instructed. Patient is to use his lower denture minimally until lower denture can be evaluated as a potential source for denture irritation and subsequent exposed bone. Patient is to maintain a soft mechanical diet at this time. Patient was reminded to have the Xgeva therapy discontinued until patient is cleared for restart by dental medicine. Patient return to clinic as scheduled in April 2020 Patient to call if questions or problems arise before then.  Lenn Cal, DDS

## 2018-10-04 NOTE — Progress Notes (Signed)
PROGRESS NOTE  Date of Examination:  10/04/2018 Patient Name:   Matthew [name] Date of Birth:   [birth year] Medical Record Number: [id number]  VITALS: BP 128/79 (BP Location: Left Arm)   Pulse 84   Temp 97.8 F (36.6 C)   CHIEF COMPLAINT: Patient referred by Dr. Vickey Huger for evaluation of exposed bone.  HPI: DERRAL COLUCCI is a 63 year old male previously seen on 01/29/2016 for a pre-Xgeva dental protocol consultation.  Patient subsequently had all remaining teeth extracted with alveoloplasty and bilateral mandibular tori reductions in the operating room on 03/06/2016. Patient had delayed healing involving the mandibular right quadrant that was noted on 04/15/2016. Patient was reexamined on 05/26/2016 and the exposed bone had resolved. Patient then proceeded with the start of Xgeva therapy with his medical oncologist.  Patient indicates that he did follow-up with [facility] fabrication of an upper and lower complete dentures in Spring of 2018. Patient indicates that he does not wear his dentures much at all.  Patient currently denies acute toothaches, swellings, or abscesses. Patient does indicate that he has exposed bone involving the left mandible. This has been present for 6 months by patient report.  The patient indicates that this area does not hurt but he has been putting his tongue in the area throughout the day.  Patient denies having any tongue ulceration at this time.  Patient again denies having dental phobia.  PROBLEM LIST: Patient Active Problem List   Diagnosis Date Noted  . Prostate carcinoma ([place]) 12/26/2015    Priority: High  . Bone metastases ([place]) 12/26/2015    Priority: Medium  . Goals of care, counseling/discussion 09/20/2018  . Genetic testing 08/26/2018  . Family history of prostate cancer   . Family history of stomach cancer   . Constipation 02/15/2018  . Esophageal dysphagia 02/15/2018  . Poor appetite 02/15/2018  . Abdominal pain, epigastric 02/15/2018  . Loss  of weight 02/15/2018  . Cancer related pain 06/02/2016  . Burn 01/14/2016    PMH: Past Medical History:  Diagnosis Date  . Arthritis    Left knee  . Bone cancer ([place])   . Bone metastases ([place]) 12/26/2015  . Cancer [facility])    Prostate  . DVT (deep venous thrombosis) ([place]) 2004  . Family history of prostate cancer   . Family history of stomach cancer   . Kidney stones    ~2002  . Prostate carcinoma ([place]) 12/26/2015    PSH: Past Surgical History:  Procedure Laterality Date  . CIRCUMCISION  30 years ago  . MULTIPLE EXTRACTIONS WITH ALVEOLOPLASTY N/A 03/06/2016   Procedure: Extraction of tooth #'s 3,5,6,8-12,20-22, 27, 28, and 31 with alveoloplasty and bilateral mandibular tori reductions;  Surgeon: Lenn Cal, DDS;  Location: [place];  Service: Oral Surgery;  Laterality: N/A;    ALLERGIES: Allergies  Allergen Reactions  . Meloxicam Other (See Comments)    Caused stomach bleeding.    MEDICATIONS: Current Outpatient Medications  Medication Sig Dispense Refill  . abiraterone acetate (ZYTIGA) 250 MG tablet Take 4 tablets (1,000 mg total) by mouth daily. Take on an empty stomach 1 hour before or 2 hours after a meal 120 tablet 0  . Calcium Carb-Cholecalciferol (CALCIUM 500 + D3) 500-600 MG-UNIT TABS Take 2 tablets by mouth daily. 60 tablet 6  . ergocalciferol (VITAMIN D2) 50000 units capsule Take 1 capsule (50,000 Units total) by mouth every Friday. 30 capsule 4  . fentaNYL (DURAGESIC) 75 MCG/HR Place 1 patch onto the skin every 3 (three)  days. 10 patch 0  . HYDROcodone-acetaminophen (NORCO) 10-325 MG tablet Take 1 tablet by mouth every 4 (four) hours as needed. 80 tablet 0  . lidocaine-prilocaine (EMLA) cream Apply to affected area once 30 g 3  . lidocaine-prilocaine (EMLA) cream Apply a quarter size amount to port site 1 hour prior to chemo. Do not rub in. Cover with plastic wrap. 30 g 3  . lubiprostone (AMITIZA) 24 MCG capsule Take 1 capsule (24 mcg total) by mouth 2 (two)  times daily with a meal. 60 capsule 3  . omeprazole (PRILOSEC) 20 MG capsule Take 1 capsule (20 mg total) by mouth daily. (Patient taking differently: Take 20 mg by mouth as needed. ) 60 capsule 3  . ondansetron (ZOFRAN) 8 MG tablet TAKE ONE TABLET BY MOUTH EVERY 8 HOURS AS NEEDED FOR NAUSEA OR VOMITING 30 tablet 1  . predniSONE (DELTASONE) 10 MG tablet Take 1 tablet (10 mg total) by mouth daily. 21 tablet 3  . predniSONE (DELTASONE) 5 MG tablet TAKE 1 TABLET BY MOUTH TWICE DAILY WITH A MEAL 60 tablet 3  . prochlorperazine (COMPAZINE) 10 MG tablet Take 1 tablet (10 mg total) by mouth every 6 (six) hours as needed for nausea or vomiting. 30 tablet 1  . prochlorperazine (COMPAZINE) 10 MG tablet Take 1 tablet (10 mg total) by mouth every 6 (six) hours as needed (Nausea or vomiting). 30 tablet 1  . temazepam (RESTORIL) 15 MG capsule Take 1 capsule (15 mg total) by mouth at bedtime as needed for sleep. 30 capsule 0  . Wheat Dextrin (BENEFIBER PO) Take by mouth as needed.     No current facility-administered medications for this visit.     LABS: Lab Results  Component Value Date   WBC 4.6 09/21/2018   HGB 13.5 09/21/2018   HCT 41.3 09/21/2018   MCV 91.0 09/21/2018   PLT 135 (L) 09/21/2018      Component Value Date/Time   NA 138 09/21/2018 1032   K 4.2 09/21/2018 1032   CL 107 09/21/2018 1032   CO2 23 09/21/2018 1032   GLUCOSE 135 (H) 09/21/2018 1032   BUN 14 09/21/2018 1032   CREATININE 1.05 09/21/2018 1032   CALCIUM 8.7 (L) 09/21/2018 1032   GFRNONAA >60 09/21/2018 1032   GFRAA >60 09/21/2018 1032   Lab Results  Component Value Date   INR 0.96 01/01/2016   INR 1.04 12/24/2015   No results found for: PTT  SOCIAL HISTORY: Social History   Socioeconomic History  . Marital status: Single    Spouse name: Not on file  . Number of children: 2  . Years of education: Not on file  . Highest education level: Not on file  Occupational History  . Occupation: Research scientist (life sciences)   Social Needs  . Financial resource strain: Not on file  . Food insecurity:    Worry: Not on file    Inability: Not on file  . Transportation needs:    Medical: Not on file    Non-medical: Not on file  Tobacco Use  . Smoking status: Current Every Day Smoker    Packs/day: 1.00    Years: 50.00    Pack years: 50.00    Types: Cigarettes    Start date: 06/16/1965  . Smokeless tobacco: Former Systems developer    Types: [place] date: 08/04/2005  Substance and Sexual Activity  . Alcohol use: No    Alcohol/week: 0.0 standard drinks  . Drug use: Yes    Types: Marijuana  Comment: THC for appetite; occ  . Sexual activity: Not on file  Lifestyle  . Physical activity:    Days per week: Not on file    Minutes per session: Not on file  . Stress: Not on file  Relationships  . Social connections:    Talks on phone: Not on file    Gets together: Not on file    Attends religious service: Not on file    Active member of club or organization: Not on file    Attends meetings of clubs or organizations: Not on file    Relationship status: Not on file  . Intimate partner violence:    Fear of current or ex partner: Not on file    Emotionally abused: Not on file    Physically abused: Not on file    Forced sexual activity: Not on file  Other Topics Concern  . Not on file  Social History Narrative  . Not on file    FAMILY HISTORY: Family History  Problem Relation Age of Onset  . Stomach cancer Mother        dx 74s  . Aneurysm Maternal Aunt        brain  . Alcohol abuse Maternal Uncle   . Stomach cancer Maternal Grandmother        dx 50s-60s  . Prostate cancer Maternal Uncle        dx 56s    REVIEW OF SYSTEMS: Reviewed with the patient as per History of present illness. Psych: Patient denies having dental phobia.  DENTAL HISTORY: HIEF COMPLAINT: Patient referred by Dr. Vickey Huger for evaluation of exposed bone.  HPI: ALLON COSTLOW is a 63 year old male previously seen on 01/29/2016  for a pre-Xgeva dental protocol consultation.  Patient subsequently had all remaining teeth extracted with alveoloplasty and bilateral mandibular tori reductions in the operating room on 03/06/2016. Patient had delayed healing involving the mandibular right quadrant that was noted on 04/15/2016. Patient was reexamined on 05/26/2016 and the exposed bone had resolved. Patient then proceeded with the start of Xgeva therapy with his medical oncologist.  Patient indicates that he did follow-up with [facility] fabrication of an upper and lower complete dentures in Spring of 2018. Patient indicates that he does not wear his dentures much at all.  Patient currently denies acute toothaches, swellings, or abscesses. Patient does indicate that he has exposed bone involving the left mandible. This has been present for 6 months by patient report.  The patient indicates that this area does not hurt but he has been putting his tongue in the area throughout the day.  Patient denies having any tongue ulceration at this time.  Patient again denies having dental phobia.   DENTAL EXAMINATION: GENERAL:  Patient is a well-developed, well-nourished male in no acute distress. HEAD AND NECK:  There is no palpable neck lymphadenopathy. The patient denies acute TMJ symptoms. INTRAORAL EXAM: Patient has normal saliva.  The patient is edentulous and has atrophy of the edentulous alveolar ridges.The patient has a 1 x 3 mm area of exposed bone on the alveolar ridge in the tooth area of #31. This bone is at the level of the gum line. Patient also has a 3 x 6 mm area of exposed bone involving the left mandible in the area of 17/18. The bone is extruding beyond the level of the soft tissue. DENTITION:  Patient is edentulous. PROSTHODONTIC:  Patient indicates that he has an upper and lower complete denture that were made in  2018. However, the patient did not bring the dentures with him today. The patient again indicates that he does not wear  the dentures "much at all". OCCLUSION:  Unable to evaluate the occlusion at this time  RADIOGRAPHIC INTERPRETATION: An orthopantogram was taken. The patient is edentulous. There is atrophy of the edentulous alveolar ridges. There is no obvious significant bone loss or bony defect associated with the areas of the exposed bone.   ASSESSMENTS: 1. Prostate cancer-with current chemotherapy 2. History of Xgeva therapy since 2017. 3. Exposed mandibular bone and the areas of tooth #17/18 and 31. 4. 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 Completely edentulous 6. Upper and lower complete dentures   PLAN/RECOMMENDATIONS: 1. 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 We discussed various treatment options to include no treatment, partial ostectomy in the area of tooth #17/18, use of chlorhexidine rinses twice daily as prescribed, is continuation of the Xgeva therapy at this time, and need for follow-up evaluation to monitor exposed bone. Patient has been instructed to continue limited use of his mandibular denture until this can be further evaluated as a source of denture irritation and the exposed bone. The patient currently wishes to proceed with partial ostectomy procedure today with initiation of the chlorhexidine rinses as prescribed. Patient will then return to clinic in 4-6 weeks for reevaluation of healing of the exposed bone.   2. Discussion of findings with medical team and coordination of future medical and dental care as needed.  Xgeva therapy is to be discontinued until patient is cleared for restart by dental medicine.   Procedure: Topical anesthetic was applied to the exposed bone and soft tissues in the area tooth #17 and 18. The exposed bone protruding above the level of the gum was removed with a  rongeurs and smoothed with a bone file without complications. The area was irrigated with copious amounts sterile saline. Minimal heme was noted.The patient tolerated the procedure well.  Patient was dismissed to the care of his daughter in stable condition.  Return to clinic as scheduled in April 2020.    Lenn Cal, DDS

## 2018-10-11 ENCOUNTER — Telehealth (HOSPITAL_COMMUNITY): Payer: Self-pay | Admitting: *Deleted

## 2018-10-11 ENCOUNTER — Other Ambulatory Visit (HOSPITAL_COMMUNITY): Payer: Self-pay | Admitting: *Deleted

## 2018-10-11 DIAGNOSIS — G893 Neoplasm related pain (acute) (chronic): Secondary | ICD-10-CM

## 2018-10-11 DIAGNOSIS — C61 Malignant neoplasm of prostate: Secondary | ICD-10-CM

## 2018-10-11 MED ORDER — FENTANYL 75 MCG/HR TD PT72: 1.0000 | MEDICATED_PATCH | TRANSDERMAL | 0 refills | Status: DC

## 2018-10-18 ENCOUNTER — Ambulatory Visit (HOSPITAL_COMMUNITY): Payer: Self-pay | Admitting: Hematology

## 2018-10-18 ENCOUNTER — Other Ambulatory Visit (HOSPITAL_COMMUNITY): Payer: Self-pay

## 2018-10-18 ENCOUNTER — Ambulatory Visit (HOSPITAL_COMMUNITY): Payer: Self-pay

## 2018-10-25 ENCOUNTER — Inpatient Hospital Stay (HOSPITAL_COMMUNITY): Payer: Medicare Other | Attending: Hematology | Admitting: Hematology

## 2018-10-25 ENCOUNTER — Inpatient Hospital Stay (HOSPITAL_COMMUNITY): Payer: Medicare Other

## 2018-10-25 ENCOUNTER — Other Ambulatory Visit (HOSPITAL_COMMUNITY): Payer: Self-pay

## 2018-10-25 ENCOUNTER — Other Ambulatory Visit: Payer: Self-pay

## 2018-10-25 ENCOUNTER — Encounter (HOSPITAL_COMMUNITY): Payer: Self-pay | Admitting: Hematology

## 2018-10-25 VITALS — BP 105/74 | HR 77 | Temp 98.8°F | Resp 16

## 2018-10-25 DIAGNOSIS — Z5111 Encounter for antineoplastic chemotherapy: Secondary | ICD-10-CM | POA: Diagnosis not present

## 2018-10-25 DIAGNOSIS — C61 Malignant neoplasm of prostate: Secondary | ICD-10-CM

## 2018-10-25 DIAGNOSIS — C7951 Secondary malignant neoplasm of bone: Secondary | ICD-10-CM | POA: Diagnosis not present

## 2018-10-25 DIAGNOSIS — Z800 Family history of malignant neoplasm of digestive organs: Secondary | ICD-10-CM

## 2018-10-25 DIAGNOSIS — K5900 Constipation, unspecified: Secondary | ICD-10-CM

## 2018-10-25 DIAGNOSIS — Z87891 Personal history of nicotine dependence: Secondary | ICD-10-CM | POA: Diagnosis not present

## 2018-10-25 DIAGNOSIS — Z809 Family history of malignant neoplasm, unspecified: Secondary | ICD-10-CM

## 2018-10-25 LAB — COMPREHENSIVE METABOLIC PANEL
ALBUMIN: 3.7 g/dL (ref 3.5–5.0)
ALT: 17 U/L (ref 0–44)
ANION GAP: 8 (ref 5–15)
AST: 14 U/L — ABNORMAL LOW (ref 15–41)
Alkaline Phosphatase: 67 U/L (ref 38–126)
BUN: 11 mg/dL (ref 8–23)
CO2: 24 mmol/L (ref 22–32)
Calcium: 8.7 mg/dL — ABNORMAL LOW (ref 8.9–10.3)
Chloride: 103 mmol/L (ref 98–111)
Creatinine, Ser: 0.94 mg/dL (ref 0.61–1.24)
GFR calc Af Amer: >60 mL/min (ref >60)
GFR calc non Af Amer: >60 mL/min (ref >60)
GLUCOSE: 115 mg/dL — AB (ref 70–99)
Potassium: 3.8 mmol/L (ref 3.5–5.1)
Sodium: 135 mmol/L (ref 135–145)
Total Bilirubin: 0.2 mg/dL — ABNORMAL LOW (ref 0.3–1.2)
Total Protein: 6.6 g/dL (ref 6.5–8.1)

## 2018-10-25 LAB — CBC WITH DIFFERENTIAL/PLATELET
Abs Immature Granulocytes: 0.02 10*3/uL (ref 0.00–0.07)
BASOS ABS: 0.1 10*3/uL (ref 0.0–0.1)
Basophils Relative: 1 %
Eosinophils Absolute: 0.2 10*3/uL (ref 0.0–0.5)
Eosinophils Relative: 3 %
HCT: 39.8 % (ref 39.0–52.0)
Hemoglobin: 13.1 g/dL (ref 13.0–17.0)
Immature Granulocytes: 0 %
Lymphocytes Relative: 18 %
Lymphs Abs: 1.2 10*3/uL (ref 0.7–4.0)
MCH: 30.3 pg (ref 26.0–34.0)
MCHC: 32.9 g/dL (ref 30.0–36.0)
MCV: 92.1 fL (ref 80.0–100.0)
Monocytes Absolute: 0.6 10*3/uL (ref 0.1–1.0)
Monocytes Relative: 9 %
NEUTROS PCT: 69 %
Neutro Abs: 4.5 10*3/uL (ref 1.7–7.7)
Platelets: 193 10*3/uL (ref 150–400)
RBC: 4.32 MIL/uL (ref 4.22–5.81)
RDW: 14.4 % (ref 11.5–15.5)
WBC: 6.4 10*3/uL (ref 4.0–10.5)
nRBC: 0 % (ref 0.0–0.2)

## 2018-10-25 MED ORDER — SODIUM CHLORIDE 0.9% FLUSH
10.0000 mL | INTRAVENOUS | Status: DC | PRN
  Administered 2018-10-25: 10 mL
  Filled 2018-10-25: qty 10

## 2018-10-25 MED ORDER — DIPHENHYDRAMINE HCL 50 MG/ML IJ SOLN
25.0000 mg | Freq: Once | INTRAMUSCULAR | Status: AC
  Administered 2018-10-25: 25 mg via INTRAVENOUS
  Filled 2018-10-25: qty 1

## 2018-10-25 MED ORDER — SODIUM CHLORIDE 0.9 % IV SOLN
20.0000 mg | Freq: Once | INTRAVENOUS | Status: AC
  Administered 2018-10-25: 20 mg via INTRAVENOUS
  Filled 2018-10-25: qty 2

## 2018-10-25 MED ORDER — DEXTROSE 5 % IV SOLN
20.0000 mg/m2 | Freq: Once | INTRAVENOUS | Status: AC
  Administered 2018-10-25: 46 mg via INTRAVENOUS
  Filled 2018-10-25: qty 4.6

## 2018-10-25 MED ORDER — HEPARIN SOD (PORK) LOCK FLUSH 100 UNIT/ML IV SOLN
500.0000 [IU] | Freq: Once | INTRAVENOUS | Status: AC | PRN
  Administered 2018-10-25: 500 [IU]
  Filled 2018-10-25 (×2): qty 5

## 2018-10-25 MED ORDER — PEGFILGRASTIM 6 MG/0.6ML ~~LOC~~ PSKT
6.0000 mg | PREFILLED_SYRINGE | Freq: Once | SUBCUTANEOUS | Status: AC
  Administered 2018-10-25: 6 mg via SUBCUTANEOUS
  Filled 2018-10-25: qty 0.6

## 2018-10-25 MED ORDER — SODIUM CHLORIDE 0.9 % IV SOLN
10.0000 mg | Freq: Once | INTRAVENOUS | Status: AC
  Administered 2018-10-25: 10 mg via INTRAVENOUS
  Filled 2018-10-25: qty 1

## 2018-10-25 MED ORDER — SODIUM CHLORIDE 0.9 % IV SOLN
Freq: Once | INTRAVENOUS | Status: AC
  Administered 2018-10-25: 11:00:00 via INTRAVENOUS

## 2018-10-25 MED ORDER — FAMOTIDINE IN NACL 20-0.9 MG/50ML-% IV SOLN: 20.0000 mg | Freq: Once | INTRAVENOUS | Status: DC

## 2018-10-25 MED ORDER — PALONOSETRON HCL INJECTION 0.25 MG/5ML
0.2500 mg | Freq: Once | INTRAVENOUS | Status: AC
  Administered 2018-10-25: 0.25 mg via INTRAVENOUS
  Filled 2018-10-25: qty 5

## 2018-10-25 MED ORDER — DENOSUMAB 120 MG/1.7ML ~~LOC~~ SOLN
120.0000 mg | Freq: Once | SUBCUTANEOUS | Status: AC
  Administered 2018-10-25: 120 mg via SUBCUTANEOUS
  Filled 2018-10-25: qty 1.7

## 2018-10-25 NOTE — Progress Notes (Signed)
Labs reviewed at office visit, will increase jevtana dose per MD. Will give xgeva today per orders. Proceed with treatment per MD.   Per [place], onpro was authorized.   Marland KitchenLeonette Most arrived today for [facility] neulasta on body injector. See MAR for administration details. Injector in place and engaged with green light indicator on flashing. Tolerated application with out problems.  Treatment given per orders. Patient tolerated it well without problems. Vitals stable and discharged home from clinic ambulatory. Follow up as scheduled.

## 2018-10-25 NOTE — Patient Instructions (Addendum)
[facility] at [facility] Discharge Instructions  You were seen today by Dr. Katragadda. He went over your recent lab results. He will see you back in 4 weeks for labs and follow up.   Thank you for choosing [facility] at [facility] to provide your oncology and hematology care.  To afford each patient quality time with our provider, please arrive at least 15 minutes before your scheduled appointment time.   If you have a lab appointment with the Cancer Center please come in thru the  Main Entrance and check in at the main information desk  You need to re-schedule your appointment should you arrive 10 or more minutes late.  We strive to give you quality time with our providers, and arriving late affects you and other patients whose appointments are after yours.  Also, if you no show three or more times for appointments you may be dismissed from the clinic at the providers discretion.     Again, thank you for choosing [facility].  Our hope is that these requests will decrease the amount of time that you wait before being seen by our physicians.       _____________________________________________________________  Should you have questions after your visit to [facility], please contact our office at [phone] between the hours of 8:00 a.m. and 4:30 p.m.  Voicemails left after 4:00 p.m. will not be returned until the following business day.  For prescription refill requests, have your pharmacy contact our office and allow 72 hours.    Cancer Center Support Programs:   > Cancer Support Group  2nd Tuesday of the month 1pm-2pm, Journey Room    

## 2018-10-25 NOTE — Patient Instructions (Signed)

## 2018-10-25 NOTE — Progress Notes (Signed)
Matthew [name], Matthew [name] [id number]   CLINIC:  Medical Oncology/Hematology  PCP:  [name], MD Matthew [name] [id number] [phone]   REASON FOR VISIT:  Follow-up forprostate cancer  CURRENT THERAPY:Cabazitaxel every 4 weeks.   BRIEF ONCOLOGIC HISTORY:    Prostate carcinoma ([place])   12/10/2015 Imaging    CTA chest, multiple thoracic and rib osseous lesions. no primary evident. Atherosclerosis including aortic and CAD    12/12/2015 Imaging    CT abdomen/pelvis with sclerotic osseous lesions througout the lumbar spine, sacrum, bilateral iliac bones and L proximal femur worrisome for sclerotic osseous mets,mild prostatomegaly, no LAD, Infrarenal 3.8 cm AAA    12/12/2015 Tumor Marker    PSA 153.85    12/18/2015 Imaging    Bone Scan Diffuse bony metastatic disease, posterior calvarium, sternum, thoracic, lumbar spine, bilateral ribs, L shoulder, bilateral bony pelvis, proximal L femur    12/24/2015 Initial Biopsy    CT biopsy of L iliac bone lesion    12/26/2015 Pathology Results    Bone, biopsy, left iliac - POSITIVE FOR METASTATIC ADENOCARCINOMA.    12/28/2015 - 09/01/2016 Chemotherapy    Firmagon instituted 240 mg     01/01/2016 Procedure    Port placed by IR.    01/07/2016 - 04/11/2016 Chemotherapy    Docetaxel every 21 days x 6 cycles with dose reductions due to tolerance    02/18/2016 Adverse Reaction    GI toxicity from docetaxel, dose reduced to 60 mg/m2    02/18/2016 Treatment Plan Change    Docetaxel dose reduced by 20%    03/06/2016 Procedure    Dr. Enrique Sack- Multiple extraction of tooth numbers 3, 5, 6, 8, 9, 10, 11, 12, 20, 21, 22, 27, 28, and 31. 4 Quadrants of alveoloplasty. Bilateral mandibular lingual tori reductions.    04/09/2016 Imaging    Brain MRI 1. No intracranial metastatic disease 2. No acute intracranial abnormality 3. Findings of chronic microvascular disease    05/14/2016 Imaging    CT C/A/P  Stable appearing diffuse bony metastatic disease. No new significant disease is seen Innumerable patchy sclerotic osseous metastases throughout the axial and proximal appendicular skeleton, stable in size and distribution, significantly increased in density in the interval, likely reflecting treatment effect. 2. No new or progressive metastatic disease in the chest, abdomen or pelvis. 3. Bilateral perifissural pulmonary nodules are stable and probably benign. 4. Aortic atherosclerosis. Infrarenal 4.3 cm abdominal aortic aneurysm, minimally increased in size. Recommend followup by ultrasound in 1 year.    06/02/2016 Tumor Marker    PSA 0.99 ng/ml     - 07/31/2016 Radiation Therapy    Palliative XRT by Dr. Lianne Cure in [place].     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  The Multi-Gene Panel offered by [facility] includes sequencing and/or deletion duplication testing of the following 85 genes: AIP, ALK, APC, ATM, AXIN2,BAP1,  BARD1, BLM, BMPR1A, BRCA1, BRCA2, BRIP1, CASR, CDC73, CDH1, CDK4, CDKN1B, CDKN1C, CDKN2A (p14ARF), CDKN2A (p16INK4a), CEBPA, CHEK2, CTNNA1, DICER1, DIS3L2, EGFR (c.2369C>T, p.Thr790Met variant only), EPCAM (Deletion/duplication testing only), FH, FLCN, GATA2, GPC3, GREM1 (Promoter region deletion/duplication testing only), HOXB13 (c.251G>A, p.Gly84Glu), HRAS, KIT, MAX, MEN1, MET, MITF (c.952G>A, p.Glu318Lys variant only), MLH1, MSH2, MSH3, MSH6, MUTYH, NBN, NF1, NF2, NTHL1, PALB2, PDGFRA, PHOX2B, PMS2, POLD1, POLE, POT1, PRKAR1A, PTCH1, PTEN, RAD50, RAD51C,  RAD51D, RB1, RECQL4, RET, RNF43, RUNX1, SDHAF2, SDHA (sequence changes only), SDHB, SDHC, SDHD, SMAD4, SMARCA4, SMARCB1, SMARCE1, STK11, SUFU, TERC, TERT, TMEM127, TP53, TSC1, TSC2, VHL, WRN and WT1.  The report date is August 24, 2018  This is not thought to increase the risk for colon  cancer or other cancers, unless there is the presence of two pathogenic mutations.    09/27/2018 -  Chemotherapy    The patient had palonosetron (ALOXI) injection 0.25 mg, 0.25 mg, Intravenous,  Once, 2 of 4 cycles Administration: 0.25 mg (09/27/2018), 0.25 mg (10/25/2018) pegfilgrastim (NEULASTA ONPRO KIT) injection 6 mg, 6 mg, Subcutaneous, Once, 1 of 3 cycles pegfilgrastim-cbqv (UDENYCA) injection 6 mg, 6 mg, Subcutaneous, Once, 1 of 1 cycle Administration: 6 mg (09/29/2018) cabazitaxel (JEVTANA) 35 mg in dextrose 5 % 250 mL chemo infusion, 15 mg/m2 = 35 mg (100 % of original dose 15 mg/m2), Intravenous,  Once, 2 of 4 cycles Dose modification: 15 mg/m2 (original dose 15 mg/m2, Cycle 1, Reason: Provider Judgment), 20 mg/m2 (original dose 15 mg/m2, Cycle 2, Reason: Provider Judgment) Administration: 35 mg (09/27/2018)  for chemotherapy treatment.      Bone metastases ([place])   12/26/2015 Initial Diagnosis    Bone metastases ([place])    09/27/2018 -  Chemotherapy    The patient had palonosetron (ALOXI) injection 0.25 mg, 0.25 mg, Intravenous,  Once, 2 of 4 cycles Administration: 0.25 mg (09/27/2018), 0.25 mg (10/25/2018) pegfilgrastim (NEULASTA ONPRO KIT) injection 6 mg, 6 mg, Subcutaneous, Once, 1 of 3 cycles pegfilgrastim-cbqv (UDENYCA) injection 6 mg, 6 mg, Subcutaneous, Once, 1 of 1 cycle Administration: 6 mg (09/29/2018) cabazitaxel (JEVTANA) 35 mg in dextrose 5 % 250 mL chemo infusion, 15 mg/m2 = 35 mg (100 % of original dose 15 mg/m2), Intravenous,  Once, 2 of 4 cycles Dose modification: 15 mg/m2 (original dose 15 mg/m2, Cycle 1, Reason: Provider Judgment), 20 mg/m2 (original dose 15 mg/m2, Cycle 2, Reason: Provider Judgment) Administration: 35 mg (09/27/2018)  for chemotherapy treatment.       CANCER STAGING: Cancer Staging Prostate carcinoma [facility]) Staging form: Prostate, AJCC 7th Edition - Clinical stage from 12/24/2015: Stage IV (TX, N0, M1b, PSA: 20 or greater) - Signed by Baird Cancer, PA-C on 01/07/2016    INTERVAL HISTORY:  Matthew [name] 63 y.o. male returns for routine follow-up. He is here [place] alone. He states that he does experience constipation. He states that he gets short of breath with activity. He states that his last treatment went well until he got his Udenyca injection, had headaches, bone pain.  Denies any nausea, vomiting, or diarrhea. Denies any new pains. Had not noticed any recent bleeding such as epistaxis, hematuria or hematochezia. Denies recent chest pain on exertion, pre-syncopal episodes, or palpitations. Denies any numbness or tingling in hands or feet. Denies any recent fevers, infections, or recent hospitalizations. Patient reports appetite at 50% and energy level at 50%.     REVIEW OF SYSTEMS:  Review of Systems  Respiratory: Positive for shortness of breath.   Gastrointestinal: Positive for constipation.  Hematological: Bruises/bleeds easily.  Psychiatric/Behavioral: Positive for sleep disturbance. The patient is nervous/anxious.      PAST MEDICAL/SURGICAL HISTORY:  Past Medical History:  Diagnosis Date  . Arthritis    Left knee  . Bone cancer ([place])   . Bone metastases ([place]) 12/26/2015  . Cancer [facility])    Prostate  . DVT (deep venous thrombosis) ([place]) 2004  . Family history of prostate cancer   . Family history of  stomach cancer   . Kidney stones    ~2002  . Prostate carcinoma ([place]) 12/26/2015   Past Surgical History:  Procedure Laterality Date  . CIRCUMCISION  30 years ago  . MULTIPLE EXTRACTIONS WITH ALVEOLOPLASTY N/A 03/06/2016   Procedure: Extraction of tooth #'s 3,5,6,8-12,20-22, 27, 28, and 31 with alveoloplasty and bilateral mandibular tori reductions;  Surgeon: Lenn Cal, DDS;  Location: [facility];  Service: Oral Surgery;  Laterality: N/A;     SOCIAL HISTORY:  Social History   Socioeconomic History  . Marital status: Single    Spouse name: Not on file  . Number of children: 2  . Years of education: Not on  file  . Highest education level: Not on file  Occupational History  . Occupation: Research scientist (life sciences)  Social Needs  . Financial resource strain: Not on file  . Food insecurity:    Worry: Not on file    Inability: Not on file  . Transportation needs:    Medical: Not on file    Non-medical: Not on file  Tobacco Use  . Smoking status: Current Every Day Smoker    Packs/day: 1.00    Years: 50.00    Pack years: 50.00    Types: Cigarettes    Start date: 06/16/1965  . Smokeless tobacco: Former Systems developer    Types: [place] date: 08/04/2005  Substance and Sexual Activity  . Alcohol use: No    Alcohol/week: 0.0 standard drinks  . Drug use: Yes    Types: Marijuana    Comment: THC for appetite; occ  . Sexual activity: Not on file  Lifestyle  . Physical activity:    Days per week: Not on file    Minutes per session: Not on file  . Stress: Not on file  Relationships  . Social connections:    Talks on phone: Not on file    Gets together: Not on file    Attends religious service: Not on file    Active member of club or organization: Not on file    Attends meetings of clubs or organizations: Not on file    Relationship status: Not on file  . Intimate partner violence:    Fear of current or ex partner: Not on file    Emotionally abused: Not on file    Physically abused: Not on file    Forced sexual activity: Not on file  Other Topics Concern  . Not on file  Social History Narrative  . Not on file    FAMILY HISTORY:  Family History  Problem Relation Age of Onset  . Stomach cancer Mother        dx 89s  . Aneurysm Maternal Aunt        brain  . Alcohol abuse Maternal Uncle   . Stomach cancer Maternal Grandmother        dx 50s-60s  . Prostate cancer Maternal Uncle        dx 5s    CURRENT MEDICATIONS:  Outpatient Encounter Medications as of 10/25/2018  Medication Sig  . abiraterone acetate (ZYTIGA) 250 MG tablet Take 4 tablets (1,000 mg total) by mouth daily. Take on an  empty stomach 1 hour before or 2 hours after a meal  . Calcium Carb-Cholecalciferol (CALCIUM 500 + D3) 500-600 MG-UNIT TABS Take 2 tablets by mouth daily.  . chlorhexidine (PERIDEX) 0.12 % solution Rinse with 15 mls twice daily for 30 seconds. Spit out excess. Do not swallow.  . ergocalciferol (VITAMIN D2)  50000 units capsule Take 1 capsule (50,000 Units total) by mouth every Friday.  . fentaNYL (DURAGESIC) 75 MCG/HR Place 1 patch onto the skin every 3 (three) days.  Marland Kitchen HYDROcodone-acetaminophen (NORCO) 10-325 MG tablet Take 1 tablet by mouth every 4 (four) hours as needed.  . lidocaine-prilocaine (EMLA) cream Apply to affected area once  . lidocaine-prilocaine (EMLA) cream Apply a quarter size amount to port site 1 hour prior to chemo. Do not rub in. Cover with plastic wrap.  . lubiprostone (AMITIZA) 24 MCG capsule Take 1 capsule (24 mcg total) by mouth 2 (two) times daily with a meal.  . omeprazole (PRILOSEC) 20 MG capsule Take 1 capsule (20 mg total) by mouth daily. (Patient taking differently: Take 20 mg by mouth as needed. )  . ondansetron (ZOFRAN) 8 MG tablet TAKE ONE TABLET BY MOUTH EVERY 8 HOURS AS NEEDED FOR NAUSEA OR VOMITING  . predniSONE (DELTASONE) 10 MG tablet Take 1 tablet (10 mg total) by mouth daily.  . predniSONE (DELTASONE) 5 MG tablet TAKE 1 TABLET BY MOUTH TWICE DAILY WITH A MEAL  . prochlorperazine (COMPAZINE) 10 MG tablet Take 1 tablet (10 mg total) by mouth every 6 (six) hours as needed for nausea or vomiting.  . prochlorperazine (COMPAZINE) 10 MG tablet Take 1 tablet (10 mg total) by mouth every 6 (six) hours as needed (Nausea or vomiting).  . temazepam (RESTORIL) 15 MG capsule Take 1 capsule (15 mg total) by mouth at bedtime as needed for sleep.  . Wheat Dextrin (BENEFIBER PO) Take by mouth as needed.   No facility-administered encounter medications on file as of 10/25/2018.     ALLERGIES:  Allergies  Allergen Reactions  . Meloxicam Other (See Comments)    Caused  stomach bleeding.     PHYSICAL EXAM:  ECOG Performance status: 1  Vitals:   10/25/18 0857  BP: 119/82  Pulse: 99  Resp: 18  Temp: 98 F (36.7 C)  SpO2: 98%   Filed Weights   10/25/18 0857 10/25/18 0918  Weight: 234 lb 6.4 oz (106.3 kg) 234 lb 9.6 oz (106.4 kg)    Physical Exam Constitutional:      Appearance: Normal appearance.  Cardiovascular:     Rate and Rhythm: Normal rate and regular rhythm.  Pulmonary:     Effort: Pulmonary effort is normal.     Breath sounds: Normal breath sounds.  Abdominal:     Palpations: Abdomen is soft. There is no mass.  Musculoskeletal:        General: No swelling.  Skin:    General: Skin is warm.  Neurological:     General: No focal deficit present.     Mental Status: He is alert and oriented to person, place, and time.  Psychiatric:        Mood and Affect: Mood normal.        Behavior: Behavior normal.      LABORATORY DATA:  I have reviewed the labs as listed.  CBC    Component Value Date/Time   WBC 6.4 10/25/2018 0859   RBC 4.32 10/25/2018 0859   HGB 13.1 10/25/2018 0859   HCT 39.8 10/25/2018 0859   PLT 193 10/25/2018 0859   MCV 92.1 10/25/2018 0859   MCH 30.3 10/25/2018 0859   MCHC 32.9 10/25/2018 0859   RDW 14.4 10/25/2018 0859   LYMPHSABS 1.2 10/25/2018 0859   MONOABS 0.6 10/25/2018 0859   EOSABS 0.2 10/25/2018 0859   BASOSABS 0.1 10/25/2018 0859   CMP Latest Ref  Rng & Units 10/25/2018 09/21/2018 08/23/2018  Glucose 70 - 99 mg/dL 115(H) 135(H) 111(H)  BUN 8 - 23 mg/dL _0 Creatinine 0.61 - 1.24 mg/dL 0.94 1.05 1.03  Sodium 135 - 145 mmol/L 135 138 138  Potassium 3.5 - 5.1 mmol/L 3.8 4.2 4.2  Chloride 98 - 111 mmol/L 103 107 107  CO2 22 - 32 mmol/L _1 Calcium 8.9 - 10.3 mg/dL 8.7(L) 8.7(L) 8.7(L)  Total Protein 6.5 - 8.1 g/dL 6.6 6.5 7.1  Total Bilirubin 0.3 - 1.2 mg/dL 0.2(L) 0.6 0.4  Alkaline Phos 38 - 126 U/L 67 53 67  AST 15 - 41 U/L 14(L) 14(L) 13(L)  ALT 0 - 44 U/L _2 DIAGNOSTIC IMAGING:  I have independently reviewed the scans and discussed with the patient.   I have reviewed Venita Lick LPN's note and agree with the documentation.  I personally performed a face-to-face visit, made revisions and my assessment and plan is as follows.    ASSESSMENT & PLAN:   Prostate carcinoma ([place]) 1.  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 -Recent rising PSA levels, patient maintained on [name], last injection 22.5 mg on 10/02/2017 -Most recent PSA of 7, CT scan (11/17/2017) and bone scan (11/13/2017) showing slight worsening of bone metastasis, no evidence of visceral metastasis - Abiraterone thousand milligrams daily with prednisone 5 mg twice daily started on 12/02/2017.  He had some mild worsening of back pain since Abiraterone. - Abiraterone was cut back to 750 mg on 01/29/2018.  PSA on 03/04/2018 went up to 2.48.  -Abiraterone was increased back to thousand milligrams daily on 03/04/2018. - Last PSA on 08/23/2018 increased to 7.75.   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  No BRCA related mutations noted. -Bone scan on 09/09/2018 shows multiple sites of metastatic disease with new Focus of increased uptake at T10 vertebral body.  -CT of the abdomen and pelvis on 09/09/2018 did not show any visceral metastasis.   -Last [name] on 09/21/2018, 22.5 mg. -Cabazitaxel 15 mg/m cycle 1 every 4 weeks on 09/27/2018. -He did not experience any chemotherapy related side effects.  However he developed severe generalized body pains lasting about 4 to 5 days after  Udenyca injection. - He reportedly did not have any body pains when he received Neulasta on pro in the past.  Hence we will change it to Neulasta on pro during cycle 2 today. -He will proceed with cycle 2 cabazitaxel today.  I have reviewed his blood work.  I will increase  the dose to 20 mg per metered square. -We will see him back in 4 weeks for follow-up.  2.  Prostate cancer related bone pains: -He has pain in the mid back region and left hip. -He is using fentanyl 75 mcg patch.  He requires hydrocodone 5 mg up to 4 tablets/day.  3.  Bone metastasis: -He will continue monthly denosumab.  He will also continue calcium supplements.  4.  Family history: - Maternal grandmother had stomach cancer.  Sister had stomach cancer.  Brother had some type of cancer. -He underwent genetic testing on 08/14/2018.  Germline mutation testing was negative.      Orders placed this encounter:  No orders of the defined types were placed in this encounter.     Derek Jack, MD [facility]  [phone]

## 2018-10-25 NOTE — Assessment & Plan Note (Signed)
1.  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 -Recent rising PSA levels, patient maintained on [name], last injection 22.5 mg on 10/02/2017 -Most recent PSA of 7, CT scan (11/17/2017) and bone scan (11/13/2017) showing slight worsening of bone metastasis, no evidence of visceral metastasis - Abiraterone thousand milligrams daily with prednisone 5 mg twice daily started on 12/02/2017.  He had some mild worsening of back pain since Abiraterone. - Abiraterone was cut back to 750 mg on 01/29/2018.  PSA on 03/04/2018 went up to 2.48.  -Abiraterone was increased back to thousand milligrams daily on 03/04/2018. - Last PSA on 08/23/2018 increased to 7.75.   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  No BRCA related mutations noted. -Bone scan on 09/09/2018 shows multiple sites of metastatic disease with new Focus of increased uptake at T10 vertebral body.  -CT of the abdomen and pelvis on 09/09/2018 did not show any visceral metastasis.   -Last [name] on 09/21/2018, 22.5 mg. -Cabazitaxel 15 mg/m cycle 1 every 4 weeks on 09/27/2018. -He did not experience any chemotherapy related side effects.  However he developed severe generalized body pains lasting about 4 to 5 days after  Udenyca injection. - He reportedly did not have any body pains when he received Neulasta on pro in the past.  Hence we will change it to Neulasta on pro during cycle 2 today. -He will proceed with cycle 2 cabazitaxel today.  I have reviewed his blood work.  I will increase the dose to 20 mg per metered square. -We will see him back in 4 weeks for follow-up.  2.  Prostate cancer related bone pains: -He has pain in the mid back region and left hip. -He is using fentanyl 75 mcg patch.  He requires hydrocodone 5 mg up to 4 tablets/day.  3.  Bone metastasis: -He will continue monthly denosumab.  He  will also continue calcium supplements.  4.  Family history: - Maternal grandmother had stomach cancer.  Sister had stomach cancer.  Brother had some type of cancer. -He underwent genetic testing on 08/14/2018.  Germline mutation testing was negative.

## 2018-10-27 ENCOUNTER — Ambulatory Visit (HOSPITAL_COMMUNITY): Payer: Self-pay

## 2018-11-08 ENCOUNTER — Ambulatory Visit (HOSPITAL_COMMUNITY): Payer: Self-pay | Admitting: Hematology

## 2018-11-08 ENCOUNTER — Ambulatory Visit (HOSPITAL_COMMUNITY): Payer: Self-pay

## 2018-11-08 ENCOUNTER — Other Ambulatory Visit (HOSPITAL_COMMUNITY): Payer: Self-pay

## 2018-11-09 NOTE — Telephone Encounter (Signed)
Left message for daughter to call back to confirm fathers appointment on April 13th at 10:00am. Molli Posey

## 2018-11-15 ENCOUNTER — Other Ambulatory Visit (HOSPITAL_COMMUNITY): Payer: Self-pay | Admitting: Dentistry

## 2018-11-18 ENCOUNTER — Other Ambulatory Visit (HOSPITAL_COMMUNITY): Payer: Self-pay | Admitting: *Deleted

## 2018-11-18 DIAGNOSIS — G893 Neoplasm related pain (acute) (chronic): Secondary | ICD-10-CM

## 2018-11-18 DIAGNOSIS — C61 Malignant neoplasm of prostate: Secondary | ICD-10-CM

## 2018-11-18 DIAGNOSIS — C7951 Secondary malignant neoplasm of bone: Secondary | ICD-10-CM

## 2018-11-18 MED ORDER — FENTANYL 75 MCG/HR TD PT72: 1.0000 | MEDICATED_PATCH | TRANSDERMAL | 0 refills | Status: DC

## 2018-11-18 MED ORDER — HYDROCODONE-ACETAMINOPHEN 10-325 MG PO TABS: 1.0000 | ORAL_TABLET | ORAL | 0 refills | Status: DC | PRN

## 2018-11-22 ENCOUNTER — Telehealth: Payer: Self-pay

## 2018-11-22 ENCOUNTER — Other Ambulatory Visit: Payer: Self-pay

## 2018-11-22 NOTE — Telephone Encounter (Signed)
Needs to call Dr Delanna Ahmadi office

## 2018-11-22 NOTE — Telephone Encounter (Signed)
Patient last seen 06/2016, I do not see Crestor 20 mg currently listed, pharmacy wants 90 day refill  Do you concur?

## 2018-11-23 ENCOUNTER — Inpatient Hospital Stay (HOSPITAL_COMMUNITY): Payer: Medicare Other

## 2018-11-23 ENCOUNTER — Encounter (HOSPITAL_COMMUNITY): Payer: Self-pay

## 2018-11-23 ENCOUNTER — Encounter (HOSPITAL_COMMUNITY): Payer: Self-pay | Admitting: Hematology

## 2018-11-23 ENCOUNTER — Inpatient Hospital Stay (HOSPITAL_COMMUNITY): Payer: Medicare Other | Attending: Hematology

## 2018-11-23 ENCOUNTER — Inpatient Hospital Stay (HOSPITAL_COMMUNITY): Payer: Medicare Other | Admitting: Hematology

## 2018-11-23 VITALS — BP 96/65 | HR 110 | Temp 98.2°F | Resp 18 | Wt 233.4 lb

## 2018-11-23 VITALS — BP 93/61 | HR 84

## 2018-11-23 DIAGNOSIS — C7951 Secondary malignant neoplasm of bone: Secondary | ICD-10-CM | POA: Insufficient documentation

## 2018-11-23 DIAGNOSIS — F17210 Nicotine dependence, cigarettes, uncomplicated: Secondary | ICD-10-CM | POA: Diagnosis not present

## 2018-11-23 DIAGNOSIS — C61 Malignant neoplasm of prostate: Secondary | ICD-10-CM | POA: Diagnosis not present

## 2018-11-23 DIAGNOSIS — Z79899 Other long term (current) drug therapy: Secondary | ICD-10-CM

## 2018-11-23 DIAGNOSIS — Z5111 Encounter for antineoplastic chemotherapy: Secondary | ICD-10-CM | POA: Insufficient documentation

## 2018-11-23 DIAGNOSIS — Z800 Family history of malignant neoplasm of digestive organs: Secondary | ICD-10-CM

## 2018-11-23 DIAGNOSIS — M898X9 Other specified disorders of bone, unspecified site: Secondary | ICD-10-CM | POA: Diagnosis not present

## 2018-11-23 LAB — COMPREHENSIVE METABOLIC PANEL
ALT: 12 U/L (ref 0–44)
AST: 14 U/L — ABNORMAL LOW (ref 15–41)
Albumin: 3.6 g/dL (ref 3.5–5.0)
Alkaline Phosphatase: 60 U/L (ref 38–126)
Anion gap: 9 (ref 5–15)
BUN: 13 mg/dL (ref 8–23)
CO2: 24 mmol/L (ref 22–32)
Calcium: 8.5 mg/dL — ABNORMAL LOW (ref 8.9–10.3)
Chloride: 104 mmol/L (ref 98–111)
Creatinine, Ser: 1.15 mg/dL (ref 0.61–1.24)
GFR calc Af Amer: >60 mL/min (ref >60)
GFR calc non Af Amer: >60 mL/min (ref >60)
Glucose, Bld: 126 mg/dL — ABNORMAL HIGH (ref 70–99)
Potassium: 3.9 mmol/L (ref 3.5–5.1)
Sodium: 137 mmol/L (ref 135–145)
Total Bilirubin: 0.4 mg/dL (ref 0.3–1.2)
Total Protein: 6.6 g/dL (ref 6.5–8.1)

## 2018-11-23 LAB — CBC WITH DIFFERENTIAL/PLATELET
Abs Immature Granulocytes: 0.01 10*3/uL (ref 0.00–0.07)
Basophils Absolute: 0 10*3/uL (ref 0.0–0.1)
Basophils Relative: 1 %
Eosinophils Absolute: 0.2 10*3/uL (ref 0.0–0.5)
Eosinophils Relative: 3 %
HCT: 40.3 % (ref 39.0–52.0)
Hemoglobin: 13.2 g/dL (ref 13.0–17.0)
Immature Granulocytes: 0 %
Lymphocytes Relative: 14 %
Lymphs Abs: 0.8 10*3/uL (ref 0.7–4.0)
MCH: 30.9 pg (ref 26.0–34.0)
MCHC: 32.8 g/dL (ref 30.0–36.0)
MCV: 94.4 fL (ref 80.0–100.0)
Monocytes Absolute: 0.5 10*3/uL (ref 0.1–1.0)
Monocytes Relative: 8 %
Neutro Abs: 4.2 10*3/uL (ref 1.7–7.7)
Neutrophils Relative %: 74 %
Platelets: 247 10*3/uL (ref 150–400)
RBC: 4.27 MIL/uL (ref 4.22–5.81)
RDW: 14.6 % (ref 11.5–15.5)
WBC: 5.7 10*3/uL (ref 4.0–10.5)
nRBC: 0 % (ref 0.0–0.2)

## 2018-11-23 LAB — MAGNESIUM: Magnesium: 1.9 mg/dL (ref 1.7–2.4)

## 2018-11-23 MED ORDER — PALONOSETRON HCL INJECTION 0.25 MG/5ML
0.2500 mg | Freq: Once | INTRAVENOUS | Status: AC
  Administered 2018-11-23: 0.25 mg via INTRAVENOUS

## 2018-11-23 MED ORDER — DIPHENHYDRAMINE HCL 50 MG/ML IJ SOLN
INTRAMUSCULAR | Status: AC
  Filled 2018-11-23: qty 1

## 2018-11-23 MED ORDER — DENOSUMAB 120 MG/1.7ML ~~LOC~~ SOLN
SUBCUTANEOUS | Status: AC
  Filled 2018-11-23: qty 1.7

## 2018-11-23 MED ORDER — DIPHENHYDRAMINE HCL 50 MG/ML IJ SOLN
25.0000 mg | Freq: Once | INTRAMUSCULAR | Status: AC
  Administered 2018-11-23: 25 mg via INTRAVENOUS

## 2018-11-23 MED ORDER — SODIUM CHLORIDE 0.9% FLUSH
10.0000 mL | INTRAVENOUS | Status: DC | PRN
  Administered 2018-11-23: 10 mL
  Filled 2018-11-23: qty 10

## 2018-11-23 MED ORDER — PALONOSETRON HCL INJECTION 0.25 MG/5ML
INTRAVENOUS | Status: AC
  Filled 2018-11-23: qty 5

## 2018-11-23 MED ORDER — PEGFILGRASTIM 6 MG/0.6ML ~~LOC~~ PSKT
6.0000 mg | PREFILLED_SYRINGE | Freq: Once | SUBCUTANEOUS | Status: AC
  Administered 2018-11-23: 6 mg via SUBCUTANEOUS

## 2018-11-23 MED ORDER — FAMOTIDINE IN NACL 20-0.9 MG/50ML-% IV SOLN
20.0000 mg | Freq: Once | INTRAVENOUS | Status: AC
  Administered 2018-11-23: 20 mg via INTRAVENOUS

## 2018-11-23 MED ORDER — SODIUM CHLORIDE 0.9 % IV SOLN
10.0000 mg | Freq: Once | INTRAVENOUS | Status: AC
  Administered 2018-11-23: 10 mg via INTRAVENOUS
  Filled 2018-11-23: qty 10

## 2018-11-23 MED ORDER — SODIUM CHLORIDE 0.9 % IV SOLN
Freq: Once | INTRAVENOUS | Status: AC
  Administered 2018-11-23: 10:00:00 via INTRAVENOUS

## 2018-11-23 MED ORDER — DEXTROSE 5 % IV SOLN
20.0000 mg/m2 | Freq: Once | INTRAVENOUS | Status: AC
  Administered 2018-11-23: 12:00:00 46 mg via INTRAVENOUS
  Filled 2018-11-23: qty 4.6

## 2018-11-23 MED ORDER — DENOSUMAB 120 MG/1.7ML ~~LOC~~ SOLN
120.0000 mg | Freq: Once | SUBCUTANEOUS | Status: AC
  Administered 2018-11-23: 120 mg via SUBCUTANEOUS

## 2018-11-23 MED ORDER — HEPARIN SOD (PORK) LOCK FLUSH 100 UNIT/ML IV SOLN
500.0000 [IU] | Freq: Once | INTRAVENOUS | Status: AC | PRN
  Administered 2018-11-23: 500 [IU]

## 2018-11-23 MED ORDER — FAMOTIDINE IN NACL 20-0.9 MG/50ML-% IV SOLN
INTRAVENOUS | Status: AC
  Filled 2018-11-23: qty 50

## 2018-11-23 NOTE — Progress Notes (Signed)
Labs reviewed by MD today during office visit today. Proceed with treatment.   Marland KitchenLeonette [name] arrived today for [facility] neulasta on body injector. See MAR for administration details. Injector in place and engaged with green light indicator on flashing. Tolerated application with out problems.  Treatment given per orders. Patient tolerated it well without problems. Vitals stable and discharged home from clinic ambulatory. Follow up as scheduled.

## 2018-11-23 NOTE — Progress Notes (Signed)
[address] [place], [place] [address]   CLINIC:  Medical Oncology/Hematology  PCP:  [name], [place] [address] [phone]   REASON FOR VISIT:  Follow-up for prostate cancer  CURRENT THERAPY:Cabazitaxel every 4 weeks.      BRIEF ONCOLOGIC HISTORY:    Prostate carcinoma ([place])   12/10/2015 Imaging    CTA chest, multiple thoracic and rib osseous lesions. no primary evident. Atherosclerosis including aortic and CAD    12/12/2015 Imaging    CT abdomen/pelvis with sclerotic osseous lesions througout the lumbar spine, sacrum, bilateral iliac bones and L proximal femur worrisome for sclerotic osseous mets,mild prostatomegaly, no LAD, Infrarenal 3.8 cm AAA    12/12/2015 Tumor Marker    PSA 153.85    12/18/2015 Imaging    Bone Scan Diffuse bony metastatic disease, posterior calvarium, sternum, thoracic, lumbar spine, bilateral ribs, L shoulder, bilateral bony pelvis, proximal L femur    12/24/2015 Initial Biopsy    CT biopsy of L iliac bone lesion    12/26/2015 Pathology Results    Bone, biopsy, left iliac - POSITIVE FOR METASTATIC ADENOCARCINOMA.    12/28/2015 - 09/01/2016 Chemotherapy    Firmagon instituted 240 mg     01/01/2016 Procedure    Port placed by IR.    01/07/2016 - 04/11/2016 Chemotherapy    Docetaxel every 21 days x 6 cycles with dose reductions due to tolerance    02/18/2016 Adverse Reaction    GI toxicity from docetaxel, dose reduced to 60 mg/m2    02/18/2016 Treatment Plan Change    Docetaxel dose reduced by 20%    03/06/2016 Procedure    Dr. Enrique Sack- Multiple extraction of tooth numbers 3, 5, 6, 8, 9, 10, 11, 12, 20, 21, 22, 27, 28, and 31. 4 Quadrants of alveoloplasty. Bilateral mandibular lingual tori reductions.    04/09/2016 Imaging    Brain MRI 1. No intracranial metastatic disease 2. No acute intracranial abnormality 3. Findings of chronic microvascular disease    05/14/2016 Imaging    CT  C/A/P Stable appearing diffuse bony metastatic disease. No new significant disease is seen Innumerable patchy sclerotic osseous metastases throughout the axial and proximal appendicular skeleton, stable in size and distribution, significantly increased in density in the interval, likely reflecting treatment effect. 2. No new or progressive metastatic disease in the chest, abdomen or pelvis. 3. Bilateral perifissural pulmonary nodules are stable and probably benign. 4. Aortic atherosclerosis. Infrarenal 4.3 cm abdominal aortic aneurysm, minimally increased in size. Recommend followup by ultrasound in 1 year.    06/02/2016 Tumor Marker    PSA 0.99 ng/ml     - 07/31/2016 Radiation Therapy    Palliative XRT by Dr. Lianne Cure in [place].     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  The Multi-Gene Panel offered by [facility] includes sequencing and/or deletion duplication testing of the following 85 genes: AIP, ALK, APC, ATM, AXIN2,BAP1,  BARD1, BLM, BMPR1A, BRCA1, BRCA2, BRIP1, CASR, CDC73, CDH1, CDK4, CDKN1B, CDKN1C, CDKN2A (p14ARF), CDKN2A (p16INK4a), CEBPA, CHEK2, CTNNA1, DICER1, DIS3L2, EGFR (c.2369C>T, p.Thr790Met variant only), EPCAM (Deletion/duplication testing only), FH, FLCN, GATA2, GPC3, GREM1 (Promoter region deletion/duplication testing only), HOXB13 (c.251G>A, p.Gly84Glu), HRAS, KIT, MAX, MEN1, MET, MITF (c.952G>A, p.Glu318Lys variant only), MLH1, MSH2, MSH3, MSH6, MUTYH, NBN, NF1, NF2, NTHL1, PALB2, PDGFRA, PHOX2B, PMS2, POLD1, POLE, POT1, PRKAR1A,  PTCH1, PTEN, RAD50, RAD51C, RAD51D, RB1, RECQL4, RET, RNF43, RUNX1, SDHAF2, SDHA (sequence changes only), SDHB, SDHC, SDHD, SMAD4, SMARCA4, SMARCB1, SMARCE1, STK11, SUFU, TERC, TERT, TMEM127, TP53, TSC1, TSC2, VHL, WRN and WT1.  The report date is August 24, 2018  This is not thought to increase the risk for  colon cancer or other cancers, unless there is the presence of two pathogenic mutations.    09/27/2018 -  Chemotherapy    The patient had palonosetron (ALOXI) injection 0.25 mg, 0.25 mg, Intravenous,  Once, 3 of 4 cycles Administration: 0.25 mg (09/27/2018), 0.25 mg (10/25/2018), 0.25 mg (11/23/2018) pegfilgrastim (NEULASTA ONPRO KIT) injection 6 mg, 6 mg, Subcutaneous, Once, 2 of 3 cycles Administration: 6 mg (10/25/2018) pegfilgrastim-cbqv (UDENYCA) injection 6 mg, 6 mg, Subcutaneous, Once, 1 of 1 cycle Administration: 6 mg (09/29/2018) cabazitaxel (JEVTANA) 35 mg in dextrose 5 % 250 mL chemo infusion, 15 mg/m2 = 35 mg (100 % of original dose 15 mg/m2), Intravenous,  Once, 3 of 4 cycles Dose modification: 15 mg/m2 (original dose 15 mg/m2, Cycle 1, Reason: Provider Judgment), 20 mg/m2 (original dose 15 mg/m2, Cycle 2, Reason: Provider Judgment) Administration: 35 mg (09/27/2018), 46 mg (10/25/2018), 46 mg (11/23/2018)  for chemotherapy treatment.      Bone metastases ([place])   12/26/2015 Initial Diagnosis    Bone metastases ([place])    09/27/2018 -  Chemotherapy    The patient had palonosetron (ALOXI) injection 0.25 mg, 0.25 mg, Intravenous,  Once, 3 of 4 cycles Administration: 0.25 mg (09/27/2018), 0.25 mg (10/25/2018), 0.25 mg (11/23/2018) pegfilgrastim (NEULASTA ONPRO KIT) injection 6 mg, 6 mg, Subcutaneous, Once, 2 of 3 cycles Administration: 6 mg (10/25/2018) pegfilgrastim-cbqv (UDENYCA) injection 6 mg, 6 mg, Subcutaneous, Once, 1 of 1 cycle Administration: 6 mg (09/29/2018) cabazitaxel (JEVTANA) 35 mg in dextrose 5 % 250 mL chemo infusion, 15 mg/m2 = 35 mg (100 % of original dose 15 mg/m2), Intravenous,  Once, 3 of 4 cycles Dose modification: 15 mg/m2 (original dose 15 mg/m2, Cycle 1, Reason: Provider Judgment), 20 mg/m2 (original dose 15 mg/m2, Cycle 2, Reason: Provider Judgment) Administration: 35 mg (09/27/2018), 46 mg (10/25/2018), 46 mg (11/23/2018)  for chemotherapy treatment.       CANCER  STAGING: Cancer Staging Prostate carcinoma [facility]) Staging form: Prostate, AJCC 7th Edition - Clinical stage from 12/24/2015: Stage IV (TX, N0, M1b, PSA: 20 or greater) - Signed by Baird Cancer, PA-C on 01/07/2016    INTERVAL HISTORY:  Matthew [name] 63 y.o. male returns for routine follow-up and consideration for next cycle of chemotherapy. He is here today alone. He states that he has been doing great since his last treatment. He states that he still has the bone pain in his back. Denies any nausea, vomiting, or diarrhea. Denies any new pains. Had not noticed any recent bleeding such as epistaxis, hematuria or hematochezia. Denies recent chest pain on exertion, shortness of breath on minimal exertion, pre-syncopal episodes, or palpitations. Denies any numbness or tingling in hands or feet. Denies any recent fevers, infections, or recent hospitalizations. Patient reports appetite at 25% and energy level at 50%.     REVIEW OF SYSTEMS:  Review of Systems  Musculoskeletal: Positive for back pain.  All other systems reviewed and are negative.    PAST MEDICAL/SURGICAL HISTORY:  Past Medical History:  Diagnosis Date  . Arthritis    Left knee  . Bone cancer ([place])   . Bone metastases ([place]) 12/26/2015  . Cancer [facility])    Prostate  . DVT (deep venous  thrombosis) ([place]) 2004  . Family history of prostate cancer   . Family history of stomach cancer   . Kidney stones    ~2002  . Prostate carcinoma ([place]) 12/26/2015   Past Surgical History:  Procedure Laterality Date  . CIRCUMCISION  30 years ago  . MULTIPLE EXTRACTIONS WITH ALVEOLOPLASTY N/A 03/06/2016   Procedure: Extraction of tooth #'s 3,5,6,8-12,20-22, 27, 28, and 31 with alveoloplasty and bilateral mandibular tori reductions;  Surgeon: Lenn Cal, DDS;  Location: [facility];  Service: Oral Surgery;  Laterality: N/A;     SOCIAL HISTORY:  Social History   Socioeconomic History  . Marital status: Single    Spouse name: Not on file  .  Number of children: 2  . Years of education: Not on file  . Highest education level: Not on file  Occupational History  . Occupation: Research scientist (life sciences)  Social Needs  . Financial resource strain: Not on file  . Food insecurity:    Worry: Not on file    Inability: Not on file  . Transportation needs:    Medical: Not on file    Non-medical: Not on file  Tobacco Use  . Smoking status: Current Every Day Smoker    Packs/day: 1.00    Years: 50.00    Pack years: 50.00    Types: Cigarettes    Start date: 06/16/1965  . Smokeless tobacco: Former Systems developer    Types: [place] date: 08/04/2005  Substance and Sexual Activity  . Alcohol use: No    Alcohol/week: 0.0 standard drinks  . Drug use: Yes    Types: Marijuana    Comment: THC for appetite; occ  . Sexual activity: Not on file  Lifestyle  . Physical activity:    Days per week: Not on file    Minutes per session: Not on file  . Stress: Not on file  Relationships  . Social connections:    Talks on phone: Not on file    Gets together: Not on file    Attends religious service: Not on file    Active member of club or organization: Not on file    Attends meetings of clubs or organizations: Not on file    Relationship status: Not on file  . Intimate partner violence:    Fear of current or ex partner: Not on file    Emotionally abused: Not on file    Physically abused: Not on file    Forced sexual activity: Not on file  Other Topics Concern  . Not on file  Social History Narrative  . Not on file    FAMILY HISTORY:  Family History  Problem Relation Age of Onset  . Stomach cancer Mother        dx 41s  . Aneurysm Maternal Aunt        brain  . Alcohol abuse Maternal Uncle   . Stomach cancer Maternal Grandmother        dx 50s-60s  . Prostate cancer Maternal Uncle        dx 1s    CURRENT MEDICATIONS:  Outpatient Encounter Medications as of 11/23/2018  Medication Sig  . abiraterone acetate (ZYTIGA) 250 MG tablet Take 4  tablets (1,000 mg total) by mouth daily. Take on an empty stomach 1 hour before or 2 hours after a meal  . Calcium Carb-Cholecalciferol (CALCIUM 500 + D3) 500-600 MG-UNIT TABS Take 2 tablets by mouth daily.  . chlorhexidine (PERIDEX) 0.12 % solution Rinse with 15 mls  twice daily for 30 seconds. Spit out excess. Do not swallow.  . ergocalciferol (VITAMIN D2) 50000 units capsule Take 1 capsule (50,000 Units total) by mouth every Friday.  . fentaNYL (DURAGESIC) 75 MCG/HR Place 1 patch onto the skin every 3 (three) days.  Marland Kitchen HYDROcodone-acetaminophen (NORCO) 10-325 MG tablet Take 1 tablet by mouth every 4 (four) hours as needed.  . lidocaine-prilocaine (EMLA) cream Apply a quarter size amount to port site 1 hour prior to chemo. Do not rub in. Cover with plastic wrap.  . lubiprostone (AMITIZA) 24 MCG capsule Take 1 capsule (24 mcg total) by mouth 2 (two) times daily with a meal.  . omeprazole (PRILOSEC) 20 MG capsule Take 1 capsule (20 mg total) by mouth daily. (Patient taking differently: Take 20 mg by mouth as needed. )  . ondansetron (ZOFRAN) 8 MG tablet TAKE ONE TABLET BY MOUTH EVERY 8 HOURS AS NEEDED FOR NAUSEA OR VOMITING  . predniSONE (DELTASONE) 10 MG tablet Take 1 tablet (10 mg total) by mouth daily.  . predniSONE (DELTASONE) 5 MG tablet TAKE 1 TABLET BY MOUTH TWICE DAILY WITH A MEAL  . prochlorperazine (COMPAZINE) 10 MG tablet Take 1 tablet (10 mg total) by mouth every 6 (six) hours as needed for nausea or vomiting.  . prochlorperazine (COMPAZINE) 10 MG tablet Take 1 tablet (10 mg total) by mouth every 6 (six) hours as needed (Nausea or vomiting).  . temazepam (RESTORIL) 15 MG capsule Take 1 capsule (15 mg total) by mouth at bedtime as needed for sleep.  . Wheat Dextrin (BENEFIBER PO) Take by mouth as needed.  . [DISCONTINUED] lidocaine-prilocaine (EMLA) cream Apply to affected area once   No facility-administered encounter medications on file as of 11/23/2018.     ALLERGIES:  Allergies   Allergen Reactions  . Meloxicam Other (See Comments)    Caused stomach bleeding.     PHYSICAL EXAM:  ECOG Performance status: 1  Vitals:   11/23/18 0843  BP: 96/65  Pulse: (!) 110  Resp: 18  Temp: 98.2 F (36.8 C)  SpO2: 98%   Filed Weights   11/23/18 0843  Weight: 233 lb 6.4 oz (105.9 kg)    Physical Exam Vitals signs reviewed.  Constitutional:      Appearance: Normal appearance.  Cardiovascular:     Rate and Rhythm: Normal rate and regular rhythm.     Heart sounds: Normal heart sounds.  Pulmonary:     Effort: Pulmonary effort is normal.     Breath sounds: Normal breath sounds.  Abdominal:     General: There is no distension.     Palpations: Abdomen is soft. There is no mass.  Musculoskeletal:        General: No swelling.  Skin:    General: Skin is warm.  Neurological:     General: No focal deficit present.     Mental Status: He is alert and oriented to person, place, and time.  Psychiatric:        Mood and Affect: Mood normal.        Behavior: Behavior normal.      LABORATORY DATA:  I have reviewed the labs as listed.  CBC    Component Value Date/Time   WBC 5.7 11/23/2018 0840   RBC 4.27 11/23/2018 0840   HGB 13.2 11/23/2018 0840   HCT 40.3 11/23/2018 0840   PLT 247 11/23/2018 0840   MCV 94.4 11/23/2018 0840   MCH 30.9 11/23/2018 0840   MCHC 32.8 11/23/2018 0840   RDW  14.6 11/23/2018 0840   LYMPHSABS 0.8 11/23/2018 0840   MONOABS 0.5 11/23/2018 0840   EOSABS 0.2 11/23/2018 0840   BASOSABS 0.0 11/23/2018 0840   CMP Latest Ref Rng & Units 11/23/2018 10/25/2018 09/21/2018  Glucose 70 - 99 mg/dL 126(H) 115(H) 135(H)  BUN 8 - 23 mg/dL _0 Creatinine 0.61 - 1.24 mg/dL 1.15 0.94 1.05  Sodium 135 - 145 mmol/L 137 135 138  Potassium 3.5 - 5.1 mmol/L 3.9 3.8 4.2  Chloride 98 - 111 mmol/L 104 103 107  CO2 22 - 32 mmol/L _1 Calcium 8.9 - 10.3 mg/dL 8.5(L) 8.7(L) 8.7(L)  Total Protein 6.5 - 8.1 g/dL 6.6 6.6 6.5  Total Bilirubin 0.3 - 1.2  mg/dL 0.4 0.2(L) 0.6  Alkaline Phos 38 - 126 U/L 60 67 53  AST 15 - 41 U/L 14(L) 14(L) 14(L)  ALT 0 - 44 U/L _2 DIAGNOSTIC IMAGING:  I have independently reviewed the scans and discussed with the patient.   I have reviewed Venita Lick LPN's note and agree with the documentation.  I personally performed a face-to-face visit, made revisions and my assessment and plan is as follows.    ASSESSMENT & PLAN:   Prostate carcinoma ([place]) 1.  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 -Recent rising PSA levels, patient maintained on [name], last injection 22.5 mg on 10/02/2017 -Most recent PSA of 7, CT scan (11/17/2017) and bone scan (11/13/2017) showing slight worsening of bone metastasis, no evidence of visceral metastasis - Abiraterone thousand milligrams daily with prednisone 5 mg twice daily started on 12/02/2017.  He had some mild worsening of back pain since Abiraterone. - Abiraterone was cut back to 750 mg on 01/29/2018.  PSA on 03/04/2018 went up to 2.48.  -Abiraterone was increased back to thousand milligrams daily on 03/04/2018. - Last PSA on 08/23/2018 increased to 7.75.   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  No BRCA related mutations noted. -Bone scan on 09/09/2018 shows multiple sites of metastatic disease with new Focus of increased uptake at T10 vertebral body.  -CT of the abdomen and pelvis on 09/09/2018 did not show any visceral metastasis.   -Last [name] on 09/21/2018, 22.5 mg. -2 cycles of cabazitaxel, cycle 1 at 15 mg per metered square on 09/27/2018 and cycle 2 at 20 mg per metered square on 10/25/2018.   - He tolerated cycle 2 very well.  He did not experience any musculoskeletal pains after switching him to Neulasta on pro from [place]. - I have reviewed his blood work.  He may proceed with cycle 3 at 20 mg per  metered square dose. -I will see him back in 4 weeks for follow-up.  I plan to repeat his bone scan and CT scan prior to next visit to evaluate response.  We will also obtain a PSA level.  2.  Low back pain: -He will continue fentanyl 75 mcg patch.  He is requiring anywhere between 0 to 4 tablets of hydrocodone 5 mg/day.  3.  Bone metastasis: -He will continue monthly denosumab.  He will also continue calcium supplements.  4.  Family history: - Maternal grandmother had stomach cancer.  Sister had stomach cancer.  Brother had some type of cancer. -He underwent genetic testing on 08/14/2018.  Germline mutation testing was negative.  Total time spent is 25 minutes  with more than 50% of the time spent face-to-face discussing treatment plan, adverse effects and coordination of care.    Orders placed this encounter:  Orders Placed This Encounter  Procedures  . NM Bone Scan Whole Body  . CT Abdomen Pelvis W Contrast  . CBC with Differential/Platelet  . Comprehensive metabolic panel  . PSA      Derek Jack, MD [place] [phone]

## 2018-11-23 NOTE — Assessment & Plan Note (Signed)
1.  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 -Recent rising PSA levels, patient maintained on [name], last injection 22.5 mg on 10/02/2017 -Most recent PSA of 7, CT scan (11/17/2017) and bone scan (11/13/2017) showing slight worsening of bone metastasis, no evidence of visceral metastasis - Abiraterone thousand milligrams daily with prednisone 5 mg twice daily started on 12/02/2017.  He had some mild worsening of back pain since Abiraterone. - Abiraterone was cut back to 750 mg on 01/29/2018.  PSA on 03/04/2018 went up to 2.48.  -Abiraterone was increased back to thousand milligrams daily on 03/04/2018. - Last PSA on 08/23/2018 increased to 7.75.   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  No BRCA related mutations noted. -Bone scan on 09/09/2018 shows multiple sites of metastatic disease with new Focus of increased uptake at T10 vertebral body.  -CT of the abdomen and pelvis on 09/09/2018 did not show any visceral metastasis.   -Last [name] on 09/21/2018, 22.5 mg. -2 cycles of cabazitaxel, cycle 1 at 15 mg per metered square on 09/27/2018 and cycle 2 at 20 mg per metered square on 10/25/2018.   - He tolerated cycle 2 very well.  He did not experience any musculoskeletal pains after switching him to Neulasta on pro from [place]. - I have reviewed his blood work.  He may proceed with cycle 3 at 20 mg per metered square dose. -I will see him back in 4 weeks for follow-up.  I plan to repeat his bone scan and CT scan prior to next visit to evaluate response.  We will also obtain a PSA level.  2.  Low back pain: -He will continue fentanyl 75 mcg patch.  He is requiring anywhere between 0 to 4 tablets of hydrocodone 5 mg/day.  3.  Bone metastasis: -He will continue monthly denosumab.  He will also continue calcium supplements.  4.  Family history: - Maternal  grandmother had stomach cancer.  Sister had stomach cancer.  Brother had some type of cancer. -He underwent genetic testing on 08/14/2018.  Germline mutation testing was negative.

## 2018-11-23 NOTE — Patient Instructions (Signed)
[facility] Discharge Instructions for Patients Receiving Chemotherapy  Today you received the following chemotherapy agents   To help prevent nausea and vomiting after your treatment, we encourage you to take your nausea medication   If you develop nausea and vomiting that is not controlled by your nausea medication, call the clinic.   BELOW ARE SYMPTOMS THAT SHOULD BE REPORTED IMMEDIATELY:  *FEVER GREATER THAN 100.5 F  *CHILLS WITH OR WITHOUT FEVER  NAUSEA AND VOMITING THAT IS NOT CONTROLLED WITH YOUR NAUSEA MEDICATION  *UNUSUAL SHORTNESS OF BREATH  *UNUSUAL BRUISING OR BLEEDING  TENDERNESS IN MOUTH AND THROAT WITH OR WITHOUT PRESENCE OF ULCERS  *URINARY PROBLEMS  *BOWEL PROBLEMS  UNUSUAL RASH Items with * indicate a potential emergency and should be followed up as soon as possible.  Feel free to call the clinic should you have any questions or concerns. The clinic phone number is [phone].  Please show the CHEMO ALERT CARD at check-in to the Emergency Department and triage nurse.   

## 2018-11-23 NOTE — Patient Instructions (Addendum)
[facility] at [facility] Discharge Instructions  You were seen today by Dr. Katragadda. He went over your recent lab results. He will see you back in 3 weeks for labs and follow up.   Thank you for choosing [facility] at [facility] to provide your oncology and hematology care.  To afford each patient quality time with our provider, please arrive at least 15 minutes before your scheduled appointment time.   If you have a lab appointment with the Cancer Center please come in thru the  Main Entrance and check in at the main information desk  You need to re-schedule your appointment should you arrive 10 or more minutes late.  We strive to give you quality time with our providers, and arriving late affects you and other patients whose appointments are after yours.  Also, if you no show three or more times for appointments you may be dismissed from the clinic at the providers discretion.     Again, thank you for choosing [facility].  Our hope is that these requests will decrease the amount of time that you wait before being seen by our physicians.       _____________________________________________________________  Should you have questions after your visit to  Cancer Center, please contact our office at [phone] between the hours of 8:00 a.m. and 4:30 p.m.  Voicemails left after 4:00 p.m. will not be returned until the following business day.  For prescription refill requests, have your pharmacy contact our office and allow 72 hours.    Cancer Center Support Programs:   > Cancer Support Group  2nd Tuesday of the month 1pm-2pm, Journey Room    

## 2018-11-25 ENCOUNTER — Ambulatory Visit (HOSPITAL_COMMUNITY): Payer: Self-pay

## 2018-11-29 ENCOUNTER — Other Ambulatory Visit (HOSPITAL_COMMUNITY): Payer: Self-pay | Admitting: Nurse Practitioner

## 2018-11-29 DIAGNOSIS — K219 Gastro-esophageal reflux disease without esophagitis: Secondary | ICD-10-CM

## 2018-12-09 ENCOUNTER — Other Ambulatory Visit (HOSPITAL_COMMUNITY): Payer: Self-pay

## 2018-12-14 ENCOUNTER — Other Ambulatory Visit: Payer: Self-pay

## 2018-12-14 ENCOUNTER — Inpatient Hospital Stay (HOSPITAL_COMMUNITY): Payer: Medicare Other | Attending: Hematology

## 2018-12-14 ENCOUNTER — Other Ambulatory Visit (HOSPITAL_COMMUNITY): Payer: Self-pay | Admitting: *Deleted

## 2018-12-14 ENCOUNTER — Encounter (HOSPITAL_COMMUNITY): Payer: Self-pay

## 2018-12-14 VITALS — BP 107/69 | HR 102 | Temp 97.8°F | Resp 18 | Wt 236.2 lb

## 2018-12-14 DIAGNOSIS — R39198 Other difficulties with micturition: Secondary | ICD-10-CM | POA: Insufficient documentation

## 2018-12-14 DIAGNOSIS — Z5189 Encounter for other specified aftercare: Secondary | ICD-10-CM | POA: Diagnosis not present

## 2018-12-14 DIAGNOSIS — Z800 Family history of malignant neoplasm of digestive organs: Secondary | ICD-10-CM | POA: Insufficient documentation

## 2018-12-14 DIAGNOSIS — Z86718 Personal history of other venous thrombosis and embolism: Secondary | ICD-10-CM | POA: Insufficient documentation

## 2018-12-14 DIAGNOSIS — Z9221 Personal history of antineoplastic chemotherapy: Secondary | ICD-10-CM | POA: Insufficient documentation

## 2018-12-14 DIAGNOSIS — Z8042 Family history of malignant neoplasm of prostate: Secondary | ICD-10-CM | POA: Insufficient documentation

## 2018-12-14 DIAGNOSIS — F17210 Nicotine dependence, cigarettes, uncomplicated: Secondary | ICD-10-CM | POA: Insufficient documentation

## 2018-12-14 DIAGNOSIS — Z809 Family history of malignant neoplasm, unspecified: Secondary | ICD-10-CM | POA: Insufficient documentation

## 2018-12-14 DIAGNOSIS — C61 Malignant neoplasm of prostate: Secondary | ICD-10-CM

## 2018-12-14 DIAGNOSIS — C7951 Secondary malignant neoplasm of bone: Secondary | ICD-10-CM | POA: Insufficient documentation

## 2018-12-14 DIAGNOSIS — G479 Sleep disorder, unspecified: Secondary | ICD-10-CM | POA: Insufficient documentation

## 2018-12-14 DIAGNOSIS — Z5111 Encounter for antineoplastic chemotherapy: Secondary | ICD-10-CM | POA: Diagnosis not present

## 2018-12-14 DIAGNOSIS — M545 Low back pain: Secondary | ICD-10-CM | POA: Insufficient documentation

## 2018-12-14 DIAGNOSIS — F1290 Cannabis use, unspecified, uncomplicated: Secondary | ICD-10-CM | POA: Diagnosis not present

## 2018-12-14 DIAGNOSIS — Z923 Personal history of irradiation: Secondary | ICD-10-CM | POA: Diagnosis not present

## 2018-12-14 DIAGNOSIS — R1110 Vomiting, unspecified: Secondary | ICD-10-CM | POA: Diagnosis not present

## 2018-12-14 DIAGNOSIS — G893 Neoplasm related pain (acute) (chronic): Secondary | ICD-10-CM

## 2018-12-14 MED ORDER — LEUPROLIDE ACETATE (3 MONTH) 22.5 MG IM KIT
22.5000 mg | PACK | Freq: Once | INTRAMUSCULAR | Status: AC
  Administered 2018-12-14: 12:00:00 22.5 mg via INTRAMUSCULAR
  Filled 2018-12-14: qty 22.5

## 2018-12-14 MED ORDER — FENTANYL 75 MCG/HR TD PT72: 1.0000 | MEDICATED_PATCH | TRANSDERMAL | 0 refills | Status: DC

## 2018-12-14 MED ORDER — HYDROCODONE-ACETAMINOPHEN 10-325 MG PO TABS: 1.0000 | ORAL_TABLET | ORAL | 0 refills | Status: DC | PRN

## 2018-12-14 MED ORDER — LEUPROLIDE ACETATE (3 MONTH) 22.5 MG IM KIT
PACK | INTRAMUSCULAR | Status: AC
  Filled 2018-12-14: qty 22.5

## 2018-12-14 NOTE — Patient Instructions (Signed)
[place] at [facility] Discharge Instructions  Received [name] injection today. Follow-up as scheduled. Call clinic for any questions or concerns   Thank you for choosing [facility] at [facility] to provide your oncology and hematology care.  To afford each patient quality time with our provider, please arrive at least 15 minutes before your scheduled appointment time.   If you have a lab appointment with the [facility] please come in thru the  Main Entrance and check in at the main information desk  You need to re-schedule your appointment should you arrive 10 or more minutes late.  We strive to give you quality time with our providers, and arriving late affects you and other patients whose appointments are after yours.  Also, if you no show three or more times for appointments you may be dismissed from the clinic at the providers discretion.     Again, thank you for choosing [facility].  Our hope is that these requests will decrease the amount of time that you wait before being seen by our physicians.       _____________________________________________________________  Should you have questions after your visit to [facility], please contact our office at [phone] between the hours of 8:00 a.m. and 4:30 p.m.  Voicemails left after 4:00 p.m. will not be returned until the following business day.  For prescription refill requests, have your pharmacy contact our office and allow 72 hours.    Cancer Center Support Programs:   > Cancer Support Group  2nd Tuesday of the month 1pm-2pm, Journey Room

## 2018-12-14 NOTE — Progress Notes (Signed)
Cliff Damiani Winer tolerated [name] injection well without complaints or incident. VSS Pt discharged self ambulatory in satisfactory condition

## 2018-12-16 ENCOUNTER — Other Ambulatory Visit: Payer: Self-pay

## 2018-12-16 ENCOUNTER — Encounter (HOSPITAL_COMMUNITY): Payer: Self-pay

## 2018-12-16 ENCOUNTER — Ambulatory Visit (HOSPITAL_COMMUNITY)
Admission: RE | Admit: 2018-12-16 | Discharge: 2018-12-16 | Disposition: A | Discharge status: Home / Self Care | Payer: Medicare Other | Source: Ambulatory Visit | Attending: Hematology | Admitting: Hematology

## 2018-12-16 DIAGNOSIS — C61 Malignant neoplasm of prostate: Secondary | ICD-10-CM | POA: Diagnosis not present

## 2018-12-16 DIAGNOSIS — C7951 Secondary malignant neoplasm of bone: Secondary | ICD-10-CM | POA: Insufficient documentation

## 2018-12-16 DIAGNOSIS — Z8546 Personal history of malignant neoplasm of prostate: Secondary | ICD-10-CM | POA: Diagnosis not present

## 2018-12-16 MED ORDER — TECHNETIUM TC 99M MEDRONATE IV KIT
20.0000 | PACK | Freq: Once | INTRAVENOUS | Status: AC | PRN
  Administered 2018-12-16: 10:00:00 18 via INTRAVENOUS

## 2018-12-16 MED ORDER — IOHEXOL 300 MG/ML  SOLN
100.0000 mL | Freq: Once | INTRAMUSCULAR | Status: AC | PRN
  Administered 2018-12-16: 10:00:00 100 mL via INTRAVENOUS

## 2018-12-21 ENCOUNTER — Other Ambulatory Visit: Payer: Self-pay

## 2018-12-21 ENCOUNTER — Encounter (HOSPITAL_COMMUNITY): Payer: Self-pay | Admitting: Hematology

## 2018-12-21 ENCOUNTER — Inpatient Hospital Stay (HOSPITAL_COMMUNITY): Payer: Medicare Other | Admitting: Hematology

## 2018-12-21 ENCOUNTER — Inpatient Hospital Stay (HOSPITAL_COMMUNITY): Payer: Medicare Other

## 2018-12-21 VITALS — BP 121/76 | HR 64 | Temp 98.2°F | Resp 18

## 2018-12-21 DIAGNOSIS — R1110 Vomiting, unspecified: Secondary | ICD-10-CM | POA: Diagnosis not present

## 2018-12-21 DIAGNOSIS — Z923 Personal history of irradiation: Secondary | ICD-10-CM

## 2018-12-21 DIAGNOSIS — Z800 Family history of malignant neoplasm of digestive organs: Secondary | ICD-10-CM

## 2018-12-21 DIAGNOSIS — R39198 Other difficulties with micturition: Secondary | ICD-10-CM

## 2018-12-21 DIAGNOSIS — C61 Malignant neoplasm of prostate: Secondary | ICD-10-CM

## 2018-12-21 DIAGNOSIS — Z8042 Family history of malignant neoplasm of prostate: Secondary | ICD-10-CM | POA: Diagnosis not present

## 2018-12-21 DIAGNOSIS — Z86718 Personal history of other venous thrombosis and embolism: Secondary | ICD-10-CM | POA: Diagnosis not present

## 2018-12-21 DIAGNOSIS — Z9221 Personal history of antineoplastic chemotherapy: Secondary | ICD-10-CM | POA: Diagnosis not present

## 2018-12-21 DIAGNOSIS — C7951 Secondary malignant neoplasm of bone: Secondary | ICD-10-CM

## 2018-12-21 DIAGNOSIS — F1290 Cannabis use, unspecified, uncomplicated: Secondary | ICD-10-CM

## 2018-12-21 DIAGNOSIS — G479 Sleep disorder, unspecified: Secondary | ICD-10-CM | POA: Diagnosis not present

## 2018-12-21 DIAGNOSIS — Z5111 Encounter for antineoplastic chemotherapy: Secondary | ICD-10-CM | POA: Diagnosis not present

## 2018-12-21 DIAGNOSIS — Z809 Family history of malignant neoplasm, unspecified: Secondary | ICD-10-CM

## 2018-12-21 DIAGNOSIS — Z5189 Encounter for other specified aftercare: Secondary | ICD-10-CM | POA: Diagnosis not present

## 2018-12-21 DIAGNOSIS — M545 Low back pain: Secondary | ICD-10-CM

## 2018-12-21 DIAGNOSIS — F17210 Nicotine dependence, cigarettes, uncomplicated: Secondary | ICD-10-CM | POA: Diagnosis not present

## 2018-12-21 LAB — COMPREHENSIVE METABOLIC PANEL
ALT: 16 U/L (ref 0–44)
AST: 14 U/L — ABNORMAL LOW (ref 15–41)
Albumin: 3.5 g/dL (ref 3.5–5.0)
Alkaline Phosphatase: 59 U/L (ref 38–126)
Anion gap: 9 (ref 5–15)
BUN: 14 mg/dL (ref 8–23)
CO2: 26 mmol/L (ref 22–32)
Calcium: 8.6 mg/dL — ABNORMAL LOW (ref 8.9–10.3)
Chloride: 101 mmol/L (ref 98–111)
Creatinine, Ser: 0.99 mg/dL (ref 0.61–1.24)
GFR calc Af Amer: >60 mL/min (ref >60)
GFR calc non Af Amer: >60 mL/min (ref >60)
Glucose, Bld: 123 mg/dL — ABNORMAL HIGH (ref 70–99)
Potassium: 4.6 mmol/L (ref 3.5–5.1)
Sodium: 136 mmol/L (ref 135–145)
Total Bilirubin: 0.4 mg/dL (ref 0.3–1.2)
Total Protein: 6.3 g/dL — ABNORMAL LOW (ref 6.5–8.1)

## 2018-12-21 LAB — CBC WITH DIFFERENTIAL/PLATELET
Abs Immature Granulocytes: 0.02 10*3/uL (ref 0.00–0.07)
Basophils Absolute: 0 10*3/uL (ref 0.0–0.1)
Basophils Relative: 1 %
Eosinophils Absolute: 0.1 10*3/uL (ref 0.0–0.5)
Eosinophils Relative: 2 %
HCT: 38.3 % — ABNORMAL LOW (ref 39.0–52.0)
Hemoglobin: 12.4 g/dL — ABNORMAL LOW (ref 13.0–17.0)
Immature Granulocytes: 0 %
Lymphocytes Relative: 11 %
Lymphs Abs: 0.6 10*3/uL — ABNORMAL LOW (ref 0.7–4.0)
MCH: 30.7 pg (ref 26.0–34.0)
MCHC: 32.4 g/dL (ref 30.0–36.0)
MCV: 94.8 fL (ref 80.0–100.0)
Monocytes Absolute: 0.3 10*3/uL (ref 0.1–1.0)
Monocytes Relative: 6 %
Neutro Abs: 4.2 10*3/uL (ref 1.7–7.7)
Neutrophils Relative %: 80 %
Platelets: 214 10*3/uL (ref 150–400)
RBC: 4.04 MIL/uL — ABNORMAL LOW (ref 4.22–5.81)
RDW: 15.1 % (ref 11.5–15.5)
WBC: 5.2 10*3/uL (ref 4.0–10.5)
nRBC: 0 % (ref 0.0–0.2)

## 2018-12-21 LAB — PSA: Prostatic Specific Antigen: 25.01 ng/mL — ABNORMAL HIGH (ref 0.00–4.00)

## 2018-12-21 MED ORDER — TAMSULOSIN HCL 0.4 MG PO CAPS: 0.4000 mg | ORAL_CAPSULE | Freq: Every day | ORAL | 4 refills | Status: DC

## 2018-12-21 MED ORDER — FAMOTIDINE IN NACL 20-0.9 MG/50ML-% IV SOLN
20.0000 mg | Freq: Once | INTRAVENOUS | Status: AC
  Administered 2018-12-21: 20 mg via INTRAVENOUS
  Filled 2018-12-21: qty 50

## 2018-12-21 MED ORDER — SODIUM CHLORIDE 0.9% FLUSH
10.0000 mL | INTRAVENOUS | Status: DC | PRN
  Administered 2018-12-21: 10 mL
  Filled 2018-12-21: qty 10

## 2018-12-21 MED ORDER — PEGFILGRASTIM 6 MG/0.6ML ~~LOC~~ PSKT
6.0000 mg | PREFILLED_SYRINGE | Freq: Once | SUBCUTANEOUS | Status: AC
  Administered 2018-12-21: 6 mg via SUBCUTANEOUS
  Filled 2018-12-21: qty 0.6

## 2018-12-21 MED ORDER — SODIUM CHLORIDE 0.9 % IV SOLN
Freq: Once | INTRAVENOUS | Status: AC
  Administered 2018-12-21: 10:00:00 via INTRAVENOUS

## 2018-12-21 MED ORDER — DENOSUMAB 120 MG/1.7ML ~~LOC~~ SOLN
120.0000 mg | Freq: Once | SUBCUTANEOUS | Status: AC
  Administered 2018-12-21: 120 mg via SUBCUTANEOUS
  Filled 2018-12-21: qty 1.7

## 2018-12-21 MED ORDER — HEPARIN SOD (PORK) LOCK FLUSH 100 UNIT/ML IV SOLN
500.0000 [IU] | Freq: Once | INTRAVENOUS | Status: AC | PRN
  Administered 2018-12-21: 500 [IU]

## 2018-12-21 MED ORDER — DIPHENHYDRAMINE HCL 50 MG/ML IJ SOLN
25.0000 mg | Freq: Once | INTRAMUSCULAR | Status: AC
  Administered 2018-12-21: 25 mg via INTRAVENOUS
  Filled 2018-12-21: qty 1

## 2018-12-21 MED ORDER — SODIUM CHLORIDE 0.9 % IV SOLN
20.0000 mg/m2 | Freq: Once | INTRAVENOUS | Status: AC
  Administered 2018-12-21: 46 mg via INTRAVENOUS
  Filled 2018-12-21: qty 4.6

## 2018-12-21 MED ORDER — SODIUM CHLORIDE 0.9 % IV SOLN
10.0000 mg | Freq: Once | INTRAVENOUS | Status: AC
  Administered 2018-12-21: 10 mg via INTRAVENOUS
  Filled 2018-12-21: qty 10

## 2018-12-21 MED ORDER — PALONOSETRON HCL INJECTION 0.25 MG/5ML
0.2500 mg | Freq: Once | INTRAVENOUS | Status: AC
  Administered 2018-12-21: 0.25 mg via INTRAVENOUS
  Filled 2018-12-21: qty 5

## 2018-12-21 NOTE — Assessment & Plan Note (Addendum)
1.  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 - Abiraterone with prednisone from 12/02/2017 through January 2020 with progression.  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  No BRCA related mutations noted. -Last [name] 22.5 mg was on 12/14/2018. -3 cycles of cabazitaxel from 09/28/2018 through 11/23/2018. -Last PSA on 08/23/2018 was 7.74.  PSA from today is pending. -He tolerated cycle 3 reasonably well.  He had one episode of vomiting last week.  This was controlled well with Compazine. -He has mild tingling of the toes at nighttime on and off.  We will keep his a close eye on it. - Bone scan on 12/16/2018 shows stable multifocal osseous metastatic disease without significant interval change compared to 09/09/2018. -CT abdomen and pelvis on 12/16/2018 shows stable exam with no progressive findings.  Stable appearance of bony metastatic involvement.  Stable infrarenal abdominal aortic aneurysm. -We reviewed his blood work today.  We will follow-up on PSA.  He may proceed with cycle 4 today. - He had experienced trouble emptying bladder at times.  We will start him on Flomax 0.4 mg at bedtime.  2.  Low back pain: -He is wearing fentanyl 75 mcg patch. -He is requiring up to 4 tablets of hydrocodone 5 mg daily.  3.  Bone metastasis: -he will continue monthly denosumab.  He will continue calcium supplements.  4.  Family history: - Maternal grandmother had stomach cancer.  Sister had stomach cancer.  Brother had some type of cancer. -He underwent genetic testing on 08/14/2018.  Germline mutation testing was negative.

## 2018-12-21 NOTE — Progress Notes (Signed)
Matthew [name], [place] [id number]   CLINIC:  Medical Oncology/Hematology  PCP:  Matthew [name], [place] [address] [phone]   REASON FOR VISIT:  Follow-up for prostate [name]   BRIEF ONCOLOGIC HISTORY:    Prostate carcinoma ([place])   12/10/2015 Imaging    CTA chest, multiple thoracic and rib osseous lesions. no primary evident. Atherosclerosis including aortic and CAD    12/12/2015 Imaging    CT abdomen/pelvis with sclerotic osseous lesions througout the lumbar spine, sacrum, bilateral iliac bones and L proximal femur worrisome for sclerotic osseous mets,mild prostatomegaly, no LAD, Infrarenal 3.8 cm AAA    12/12/2015 Tumor Marker    PSA 153.85    12/18/2015 Imaging    Bone Scan Diffuse bony metastatic disease, posterior calvarium, sternum, thoracic, lumbar spine, bilateral ribs, L shoulder, bilateral bony pelvis, proximal L femur    12/24/2015 Initial Biopsy    CT biopsy of L iliac bone lesion    12/26/2015 Pathology Results    Bone, biopsy, left iliac - POSITIVE FOR METASTATIC ADENOCARCINOMA.    12/28/2015 - 09/01/2016 Chemotherapy    Firmagon instituted 240 mg     01/01/2016 Procedure    Port placed by IR.    01/07/2016 - 04/11/2016 Chemotherapy    Docetaxel every 21 days x 6 cycles with dose reductions due to tolerance    02/18/2016 Adverse Reaction    GI toxicity from docetaxel, dose reduced to 60 mg/m2    02/18/2016 Treatment Plan Change    Docetaxel dose reduced by 20%    03/06/2016 Procedure    Dr. Enrique [name]- Multiple extraction of tooth numbers 3, 5, 6, 8, 9, 10, 11, 12, 20, 21, 22, 27, 28, and 31. 4 Quadrants of alveoloplasty. Bilateral mandibular lingual tori reductions.    04/09/2016 Imaging    Brain MRI 1. No intracranial metastatic disease 2. No acute intracranial abnormality 3. Findings of chronic microvascular disease    05/14/2016 Imaging    CT C/A/P Stable appearing diffuse bony metastatic disease.  No new significant disease is seen Innumerable patchy sclerotic osseous metastases throughout the axial and proximal appendicular skeleton, stable in size and distribution, significantly increased in density in the interval, likely reflecting treatment effect. 2. No new or progressive metastatic disease in the chest, abdomen or pelvis. 3. Bilateral perifissural pulmonary nodules are stable and probably benign. 4. Aortic atherosclerosis. Infrarenal 4.3 cm abdominal aortic aneurysm, minimally increased in size. Recommend followup by ultrasound in 1 year.    06/02/2016 Tumor Marker    PSA 0.99 ng/ml     - 07/31/2016 Radiation Therapy    Palliative XRT by Dr. Lianne [name] in [place].     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  The Multi-Gene Panel offered by [facility] includes sequencing and/or deletion duplication testing of the following 85 genes: AIP, ALK, APC, ATM, AXIN2,BAP1,  BARD1, BLM, BMPR1A, BRCA1, BRCA2, BRIP1, CASR, CDC73, CDH1, CDK4, CDKN1B, CDKN1C, CDKN2A (p14ARF), CDKN2A (p16INK4a), CEBPA, CHEK2, CTNNA1, DICER1, DIS3L2, EGFR (c.2369C>T, p.Thr790Met variant only), EPCAM (Deletion/duplication testing only), FH, FLCN, GATA2, GPC3, GREM1 (Promoter region deletion/duplication testing only), HOXB13 (c.251G>A, p.Gly84Glu), HRAS, KIT, MAX, MEN1, MET, MITF (c.952G>A, p.Glu318Lys variant only), MLH1, MSH2, MSH3, MSH6, MUTYH, NBN, NF1, NF2, NTHL1, PALB2, PDGFRA, PHOX2B, PMS2, POLD1, POLE, POT1, PRKAR1A, PTCH1, PTEN, RAD50, RAD51C, RAD51D, RB1, RECQL4, RET, RNF43,  RUNX1, SDHAF2, SDHA (sequence changes only), SDHB, SDHC, SDHD, SMAD4, SMARCA4, SMARCB1, SMARCE1, STK11, SUFU, TERC, TERT, TMEM127, TP53, TSC1, TSC2, VHL, WRN and WT1.  The report date is August 24, 2018  This is not thought to increase the risk for colon [name] or other cancers, unless there is the  presence of two pathogenic mutations.    09/27/2018 -  Chemotherapy    The patient had palonosetron (ALOXI) injection 0.25 mg, 0.25 mg, Intravenous,  Once, 4 of 6 cycles Administration: 0.25 mg (09/27/2018), 0.25 mg (10/25/2018), 0.25 mg (11/23/2018) pegfilgrastim (NEULASTA ONPRO KIT) injection 6 mg, 6 mg, Subcutaneous, Once, 3 of 5 cycles Administration: 6 mg (10/25/2018), 6 mg (11/23/2018) pegfilgrastim-cbqv (UDENYCA) injection 6 mg, 6 mg, Subcutaneous, Once, 1 of 1 cycle Administration: 6 mg (09/29/2018) cabazitaxel (JEVTANA) 35 mg in dextrose 5 % 250 mL chemo infusion, 15 mg/m2 = 35 mg (100 % of original dose 15 mg/m2), Intravenous,  Once, 4 of 6 cycles Dose modification: 15 mg/m2 (original dose 15 mg/m2, Cycle 1, Reason: Provider Judgment), 20 mg/m2 (original dose 15 mg/m2, Cycle 2, Reason: Provider Judgment) Administration: 35 mg (09/27/2018), 46 mg (10/25/2018), 46 mg (11/23/2018)  for chemotherapy treatment.      Bone metastases ([place])   12/26/2015 Initial Diagnosis    Bone metastases ([place])    09/27/2018 -  Chemotherapy    The patient had palonosetron (ALOXI) injection 0.25 mg, 0.25 mg, Intravenous,  Once, 4 of 6 cycles Administration: 0.25 mg (09/27/2018), 0.25 mg (10/25/2018), 0.25 mg (11/23/2018) pegfilgrastim (NEULASTA ONPRO KIT) injection 6 mg, 6 mg, Subcutaneous, Once, 3 of 5 cycles Administration: 6 mg (10/25/2018), 6 mg (11/23/2018) pegfilgrastim-cbqv (UDENYCA) injection 6 mg, 6 mg, Subcutaneous, Once, 1 of 1 cycle Administration: 6 mg (09/29/2018) cabazitaxel (JEVTANA) 35 mg in dextrose 5 % 250 mL chemo infusion, 15 mg/m2 = 35 mg (100 % of original dose 15 mg/m2), Intravenous,  Once, 4 of 6 cycles Dose modification: 15 mg/m2 (original dose 15 mg/m2, Cycle 1, Reason: Provider Judgment), 20 mg/m2 (original dose 15 mg/m2, Cycle 2, Reason: Provider Judgment) Administration: 35 mg (09/27/2018), 46 mg (10/25/2018), 46 mg (11/23/2018)  for chemotherapy treatment.       [name] STAGING: [name]  Staging Prostate carcinoma [facility]) Staging form: Prostate, AJCC 7th Edition - Clinical stage from 12/24/2015: Stage IV (TX, N0, M1b, PSA: 20 or greater) - Signed by Matthew Cancer, PA-C on 01/07/2016    INTERVAL HISTORY:  Matthew [name] 63 y.o. male returns for routine follow-up and consideration for next cycle of chemotherapy. He is here today alone. He states that. Denies any nausea, or diarrhea.  He had one episode of vomiting.  He does report trouble emptying bladder at times.  Denies any new pains. Had not noticed any recent bleeding such as epistaxis, hematuria or hematochezia. Denies recent chest pain on exertion, shortness of breath on minimal exertion, pre-syncopal episodes, or palpitations. Denies any numbness or tingling in hands or feet. Denies any recent fevers, infections, or recent hospitalizations. Patient reports appetite at 50 % and energy level at 25 %.      REVIEW OF SYSTEMS:  Review of Systems  Constitutional: Positive for fatigue.  Gastrointestinal: Positive for vomiting. Negative for diarrhea.  Genitourinary: Positive for difficulty urinating.   Musculoskeletal: Positive for back pain.  Psychiatric/Behavioral: Positive for sleep disturbance.  All other systems reviewed and are negative.    PAST MEDICAL/SURGICAL HISTORY:  Past Medical History:  Diagnosis Date  . Arthritis    Left knee  . Bone [name] (  [place])   . Bone metastases ([place]) 12/26/2015  . [name] [facility])    Prostate  . DVT (deep venous thrombosis) ([place]) 2004  . Family history of prostate [name]   . Family history of stomach [name]   . Kidney stones    ~2002  . Prostate carcinoma ([place]) 12/26/2015   Past Surgical History:  Procedure Laterality Date  . CIRCUMCISION  30 years ago  . MULTIPLE EXTRACTIONS WITH ALVEOLOPLASTY N/A 03/06/2016   Procedure: Extraction of tooth #'s 3,5,6,8-12,20-22, 27, 28, and 31 with alveoloplasty and bilateral mandibular tori reductions;  Surgeon: Lenn Cal, DDS;  Location: [facility];  Service: Oral Surgery;  Laterality: N/A;     SOCIAL HISTORY:  Social History   Socioeconomic History  . Marital status: Single    Spouse name: Not on file  . Number of children: 2  . Years of education: Not on file  . Highest education level: Not on file  Occupational History  . Occupation: Research scientist (life sciences)  Social Needs  . Financial resource strain: Not on file  . Food insecurity:    Worry: Not on file    Inability: Not on file  . Transportation needs:    Medical: Not on file    Non-medical: Not on file  Tobacco Use  . Smoking status: Current Every Day Smoker    Packs/day: 1.00    Years: 50.00    Pack years: 50.00    Types: Cigarettes    Start date: 06/16/1965  . Smokeless tobacco: Former Systems developer    Types: [place] date: 08/04/2005  Substance and Sexual Activity  . Alcohol use: No    Alcohol/week: 0.0 standard drinks  . Drug use: Yes    Types: Marijuana    Comment: THC for appetite; occ  . Sexual activity: Not on file  Lifestyle  . Physical activity:    Days per week: Not on file    Minutes per session: Not on file  . Stress: Not on file  Relationships  . Social connections:    Talks on phone: Not on file    Gets together: Not on file    Attends religious service: Not on file    Active member of club or organization: Not on file    Attends meetings of clubs or organizations: Not on file    Relationship status: Not on file  . Intimate partner violence:    Fear of current or ex partner: Not on file    Emotionally abused: Not on file    Physically abused: Not on file    Forced sexual activity: Not on file  Other Topics Concern  . Not on file  Social History Narrative  . Not on file    FAMILY HISTORY:  Family History  Problem Relation Age of Onset  . Stomach [name] Mother        dx 67s  . Aneurysm Maternal Aunt        brain  . Alcohol abuse Maternal Uncle   . Stomach [name] Maternal Grandmother        dx 50s-60s  . Prostate [name]  Maternal Uncle        dx 71s    CURRENT MEDICATIONS:  Outpatient Encounter Medications as of 12/21/2018  Medication Sig  . abiraterone acetate (ZYTIGA) 250 MG tablet Take 4 tablets (1,000 mg total) by mouth daily. Take on an empty stomach 1 hour before or 2 hours after a meal  . Calcium Carb-Cholecalciferol (CALCIUM 500 +  D3) 500-600 MG-UNIT TABS Take 2 tablets by mouth daily.  . chlorhexidine (PERIDEX) 0.12 % solution Rinse with 15 mls twice daily for 30 seconds. Spit out excess. Do not swallow.  . ergocalciferol (VITAMIN D2) 50000 units capsule Take 1 capsule (50,000 Units total) by mouth every Friday.  . fentaNYL (DURAGESIC) 75 MCG/HR Place 1 patch onto the skin every 3 (three) days.  Marland Kitchen HYDROcodone-acetaminophen (NORCO) 10-325 MG tablet Take 1 tablet by mouth every 4 (four) hours as needed.  . lidocaine-prilocaine (EMLA) cream Apply a quarter size amount to port site 1 hour prior to chemo. Do not rub in. Cover with plastic wrap.  . lubiprostone (AMITIZA) 24 MCG capsule Take 1 capsule (24 mcg total) by mouth 2 (two) times daily with a meal.  . omeprazole (PRILOSEC) 20 MG capsule Take 1 capsule by mouth once daily  . ondansetron (ZOFRAN) 8 MG tablet TAKE ONE TABLET BY MOUTH EVERY 8 HOURS AS NEEDED FOR NAUSEA OR VOMITING  . predniSONE (DELTASONE) 10 MG tablet Take 1 tablet (10 mg total) by mouth daily.  . predniSONE (DELTASONE) 5 MG tablet TAKE 1 TABLET BY MOUTH TWICE DAILY WITH A MEAL  . prochlorperazine (COMPAZINE) 10 MG tablet Take 1 tablet (10 mg total) by mouth every 6 (six) hours as needed for nausea or vomiting.  . prochlorperazine (COMPAZINE) 10 MG tablet Take 1 tablet (10 mg total) by mouth every 6 (six) hours as needed (Nausea or vomiting).  . temazepam (RESTORIL) 15 MG capsule Take 1 capsule (15 mg total) by mouth at bedtime as needed for sleep.  . Wheat Dextrin (BENEFIBER PO) Take by mouth as needed.   No facility-administered encounter medications on file as of 12/21/2018.      ALLERGIES:  Allergies  Allergen Reactions  . Meloxicam Other (See Comments)    Caused stomach bleeding.     PHYSICAL EXAM:  ECOG Performance status: 1  Vitals:   12/21/18 0850  BP: 97/72  Pulse: 88  Resp: 18  Temp: 98.6 F (37 C)  SpO2: 98%   Filed Weights   12/21/18 0850  Weight: 239 lb 3.2 oz (108.5 kg)    Physical Exam Vitals signs reviewed.  Constitutional:      Appearance: Normal appearance.  Cardiovascular:     Rate and Rhythm: Normal rate and regular rhythm.     Heart sounds: Normal heart sounds.  Pulmonary:     Effort: Pulmonary effort is normal.     Breath sounds: Normal breath sounds.  Abdominal:     General: There is no distension.     Palpations: Abdomen is soft. There is no mass.  Musculoskeletal:        General: No swelling.  Skin:    General: Skin is warm.  Neurological:     General: No focal deficit present.     Mental Status: He is alert and oriented to person, place, and time.  Psychiatric:        Mood and Affect: Mood normal.        Behavior: Behavior normal.      LABORATORY DATA:  I have reviewed the labs as listed.  CBC    Component Value Date/Time   WBC 5.2 12/21/2018 0848   RBC 4.04 (L) 12/21/2018 0848   HGB 12.4 (L) 12/21/2018 0848   HCT 38.3 (L) 12/21/2018 0848   PLT 214 12/21/2018 0848   MCV 94.8 12/21/2018 0848   MCH 30.7 12/21/2018 0848   MCHC 32.4 12/21/2018 0848   RDW 15.1  12/21/2018 0848   LYMPHSABS 0.6 (L) 12/21/2018 0848   MONOABS 0.3 12/21/2018 0848   EOSABS 0.1 12/21/2018 0848   BASOSABS 0.0 12/21/2018 0848   CMP Latest Ref Rng & Units 12/21/2018 11/23/2018 10/25/2018  Glucose 70 - 99 mg/dL 123(H) 126(H) 115(H)  BUN 8 - 23 mg/dL '14 13 11  ' Creatinine 0.61 - 1.24 mg/dL 0.99 1.15 0.94  Sodium 135 - 145 mmol/L 136 137 135  Potassium 3.5 - 5.1 mmol/L 4.6 3.9 3.8  Chloride 98 - 111 mmol/L 101 104 103  CO2 22 - 32 mmol/L '26 24 24  ' Calcium 8.9 - 10.3 mg/dL 8.6(L) 8.5(L) 8.7(L)  Total Protein 6.5 - 8.1 g/dL 6.3(L)  6.6 6.6  Total Bilirubin 0.3 - 1.2 mg/dL 0.4 0.4 0.2(L)  Alkaline Phos 38 - 126 U/L 59 60 67  AST 15 - 41 U/L 14(L) 14(L) 14(L)  ALT 0 - 44 U/L '16 12 17       ' DIAGNOSTIC IMAGING:  I have independently reviewed the scans and discussed with the patient.   I have reviewed Venita Lick LPN's note and agree with the documentation.  I personally performed a face-to-face visit, made revisions and my assessment and plan is as follows.    ASSESSMENT & PLAN:   Prostate carcinoma ([place]) 1.  Metastatic castrate resistant prostate [name] to the bones: - Status post chemotherapy with docetaxel from 01/07/2016 through 04/11/2016, likely in the castration sensitive setting. - Abiraterone with prednisone from 12/02/2017 through January 2020 with progression.  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  No BRCA related mutations noted. -Last [name] 22.5 mg was on 12/14/2018. -3 cycles of cabazitaxel from 09/28/2018 through 11/23/2018. -Last PSA on 08/23/2018 was 7.74.  PSA from today is pending. -He tolerated cycle 3 reasonably well.  He had one episode of vomiting last week.  This was controlled well with Compazine. -He has mild tingling of the toes at nighttime on and off.  We will keep his a close eye on it. - Bone scan on 12/16/2018 shows stable multifocal osseous metastatic disease without significant interval change compared to 09/09/2018. -CT abdomen and pelvis on 12/16/2018 shows stable exam with no progressive findings.  Stable appearance of bony metastatic involvement.  Stable infrarenal abdominal aortic aneurysm. -We reviewed his blood work today.  We will follow-up on PSA.  He may proceed with cycle 4 today. - He had experienced trouble emptying bladder at times.  We will start him on Flomax 0.4 mg at bedtime.  2.  Low back pain: -He is wearing fentanyl 75 mcg patch. -He is requiring up to 4 tablets of hydrocodone 5 mg daily.  3.  Bone metastasis: -he will continue monthly  denosumab.  He will continue calcium supplements.  4.  Family history: - Maternal grandmother had stomach [name].  Sister had stomach [name].  Brother had some type of [name]. -He underwent genetic testing on 08/14/2018.  Germline mutation testing was negative.  Total time spent is 25 minutes with more than 50% of the time spent face-to-face discussing scan results, treatment plan and coordination of care.    Orders placed this encounter:  No orders of the defined types were placed in this encounter.     Derek Jack, MD [place] [phone]

## 2018-12-21 NOTE — Patient Instructions (Signed)
[facility] at [facility] Discharge Instructions  You were seen today by Dr. Katragadda. He went over your recent lab results. He will see you back in  for labs and follow up.   Thank you for choosing [facility] at [facility] to provide your oncology and hematology care.  To afford each patient quality time with our provider, please arrive at least 15 minutes before your scheduled appointment time.   If you have a lab appointment with the Cancer Center please come in thru the  Main Entrance and check in at the main information desk  You need to re-schedule your appointment should you arrive 10 or more minutes late.  We strive to give you quality time with our providers, and arriving late affects you and other patients whose appointments are after yours.  Also, if you no show three or more times for appointments you may be dismissed from the clinic at the providers discretion.     Again, thank you for choosing [facility].  Our hope is that these requests will decrease the amount of time that you wait before being seen by our physicians.       _____________________________________________________________  Should you have questions after your visit to [facility], please contact our office at [phone] between the hours of 8:00 a.m. and 4:30 p.m.  Voicemails left after 4:00 p.m. will not be returned until the following business day.  For prescription refill requests, have your pharmacy contact our office and allow 72 hours.    Cancer Center Support Programs:   > Cancer Support Group  2nd Tuesday of the month 1pm-2pm, Journey Room    

## 2018-12-21 NOTE — Progress Notes (Signed)
Patient seen by Dr. Delton Coombes with lab review and ok to treat verbal order.   Neulasta Onpro applied with green indicator light flashing and no alarms noted.   Xgeva shot given with no bruising or swelling noted at site.  Patient tolerated chemotherapy with no complaints voiced.  Port site clean and dry with no bruising or swelling noted at site.  Good blood return noted before and after administration of chemotherapy.  Band aid applied.  Patient left ambulatory with VSS and no s/s of distress noted.

## 2019-01-13 ENCOUNTER — Other Ambulatory Visit: Payer: Self-pay | Admitting: Pharmacist

## 2019-01-14 ENCOUNTER — Other Ambulatory Visit (HOSPITAL_COMMUNITY): Payer: Self-pay | Admitting: *Deleted

## 2019-01-14 DIAGNOSIS — C61 Malignant neoplasm of prostate: Secondary | ICD-10-CM

## 2019-01-14 DIAGNOSIS — C7951 Secondary malignant neoplasm of bone: Secondary | ICD-10-CM

## 2019-01-14 DIAGNOSIS — G893 Neoplasm related pain (acute) (chronic): Secondary | ICD-10-CM

## 2019-01-14 MED ORDER — FENTANYL 75 MCG/HR TD PT72: 1.0000 | MEDICATED_PATCH | TRANSDERMAL | 0 refills | Status: DC

## 2019-01-14 MED ORDER — HYDROCODONE-ACETAMINOPHEN 10-325 MG PO TABS: 1.0000 | ORAL_TABLET | ORAL | 0 refills | Status: DC | PRN

## 2019-01-18 ENCOUNTER — Encounter (HOSPITAL_COMMUNITY): Payer: Self-pay | Admitting: Hematology

## 2019-01-18 ENCOUNTER — Inpatient Hospital Stay (HOSPITAL_COMMUNITY): Payer: Medicare Other

## 2019-01-18 ENCOUNTER — Inpatient Hospital Stay (HOSPITAL_COMMUNITY): Payer: Medicare Other | Attending: Hematology

## 2019-01-18 ENCOUNTER — Inpatient Hospital Stay (HOSPITAL_BASED_OUTPATIENT_CLINIC_OR_DEPARTMENT_OTHER): Payer: Medicare Other | Admitting: Hematology

## 2019-01-18 ENCOUNTER — Other Ambulatory Visit: Payer: Self-pay

## 2019-01-18 VITALS — BP 116/72 | HR 67 | Temp 97.9°F | Resp 18

## 2019-01-18 DIAGNOSIS — C7951 Secondary malignant neoplasm of bone: Secondary | ICD-10-CM

## 2019-01-18 DIAGNOSIS — M545 Low back pain: Secondary | ICD-10-CM | POA: Diagnosis not present

## 2019-01-18 DIAGNOSIS — F17210 Nicotine dependence, cigarettes, uncomplicated: Secondary | ICD-10-CM

## 2019-01-18 DIAGNOSIS — C61 Malignant neoplasm of prostate: Secondary | ICD-10-CM | POA: Insufficient documentation

## 2019-01-18 DIAGNOSIS — Z5111 Encounter for antineoplastic chemotherapy: Secondary | ICD-10-CM | POA: Insufficient documentation

## 2019-01-18 LAB — CBC WITH DIFFERENTIAL/PLATELET
Abs Immature Granulocytes: 0.01 10*3/uL (ref 0.00–0.07)
Basophils Absolute: 0 10*3/uL (ref 0.0–0.1)
Basophils Relative: 1 %
Eosinophils Absolute: 0.1 10*3/uL (ref 0.0–0.5)
Eosinophils Relative: 2 %
HCT: 38.9 % — ABNORMAL LOW (ref 39.0–52.0)
Hemoglobin: 12.5 g/dL — ABNORMAL LOW (ref 13.0–17.0)
Immature Granulocytes: 0 %
Lymphocytes Relative: 14 %
Lymphs Abs: 0.8 10*3/uL (ref 0.7–4.0)
MCH: 30.9 pg (ref 26.0–34.0)
MCHC: 32.1 g/dL (ref 30.0–36.0)
MCV: 96 fL (ref 80.0–100.0)
Monocytes Absolute: 0.4 10*3/uL (ref 0.1–1.0)
Monocytes Relative: 6 %
Neutro Abs: 4.4 10*3/uL (ref 1.7–7.7)
Neutrophils Relative %: 77 %
Platelets: 246 10*3/uL (ref 150–400)
RBC: 4.05 MIL/uL — ABNORMAL LOW (ref 4.22–5.81)
RDW: 14.3 % (ref 11.5–15.5)
WBC: 5.8 10*3/uL (ref 4.0–10.5)
nRBC: 0 % (ref 0.0–0.2)

## 2019-01-18 LAB — COMPREHENSIVE METABOLIC PANEL
ALT: 12 U/L (ref 0–44)
AST: 11 U/L — ABNORMAL LOW (ref 15–41)
Albumin: 3.3 g/dL — ABNORMAL LOW (ref 3.5–5.0)
Alkaline Phosphatase: 63 U/L (ref 38–126)
Anion gap: 12 (ref 5–15)
BUN: 13 mg/dL (ref 8–23)
CO2: 24 mmol/L (ref 22–32)
Calcium: 8.9 mg/dL (ref 8.9–10.3)
Chloride: 107 mmol/L (ref 98–111)
Creatinine, Ser: 0.97 mg/dL (ref 0.61–1.24)
GFR calc Af Amer: >60 mL/min (ref >60)
GFR calc non Af Amer: >60 mL/min (ref >60)
Glucose, Bld: 128 mg/dL — ABNORMAL HIGH (ref 70–99)
Potassium: 3.6 mmol/L (ref 3.5–5.1)
Sodium: 143 mmol/L (ref 135–145)
Total Bilirubin: 0.2 mg/dL — ABNORMAL LOW (ref 0.3–1.2)
Total Protein: 6.2 g/dL — ABNORMAL LOW (ref 6.5–8.1)

## 2019-01-18 LAB — MAGNESIUM: Magnesium: 2.1 mg/dL (ref 1.7–2.4)

## 2019-01-18 MED ORDER — DENOSUMAB 120 MG/1.7ML ~~LOC~~ SOLN: 120.0000 mg | Freq: Once | SUBCUTANEOUS | Status: DC

## 2019-01-18 MED ORDER — PALONOSETRON HCL INJECTION 0.25 MG/5ML
0.2500 mg | Freq: Once | INTRAVENOUS | Status: AC
  Administered 2019-01-18: 0.25 mg via INTRAVENOUS
  Filled 2019-01-18: qty 5

## 2019-01-18 MED ORDER — SODIUM CHLORIDE 0.9% FLUSH
10.0000 mL | INTRAVENOUS | Status: AC | PRN
  Administered 2019-01-18: 10 mL
  Filled 2019-01-18: qty 10

## 2019-01-18 MED ORDER — DIPHENHYDRAMINE HCL 50 MG/ML IJ SOLN
25.0000 mg | Freq: Once | INTRAMUSCULAR | Status: AC
  Administered 2019-01-18: 25 mg via INTRAVENOUS
  Filled 2019-01-18: qty 1

## 2019-01-18 MED ORDER — FAMOTIDINE IN NACL 20-0.9 MG/50ML-% IV SOLN
20.0000 mg | Freq: Once | INTRAVENOUS | Status: AC
  Administered 2019-01-18: 20 mg via INTRAVENOUS
  Filled 2019-01-18: qty 50

## 2019-01-18 MED ORDER — SODIUM CHLORIDE 0.9 % IV SOLN
10.0000 mg | Freq: Once | INTRAVENOUS | Status: AC
  Administered 2019-01-18: 10 mg via INTRAVENOUS
  Filled 2019-01-18: qty 10

## 2019-01-18 MED ORDER — HEPARIN SOD (PORK) LOCK FLUSH 100 UNIT/ML IV SOLN
500.0000 [IU] | Freq: Once | INTRAVENOUS | Status: AC | PRN
  Administered 2019-01-18: 500 [IU]

## 2019-01-18 MED ORDER — PREDNISONE 10 MG PO TABS: 10.0000 mg | ORAL_TABLET | Freq: Every day | ORAL | 3 refills | Status: DC

## 2019-01-18 MED ORDER — PEGFILGRASTIM 6 MG/0.6ML ~~LOC~~ PSKT
6.0000 mg | PREFILLED_SYRINGE | Freq: Once | SUBCUTANEOUS | Status: AC
  Administered 2019-01-18: 6 mg via SUBCUTANEOUS
  Filled 2019-01-18: qty 0.6

## 2019-01-18 MED ORDER — SODIUM CHLORIDE 0.9 % IV SOLN
Freq: Once | INTRAVENOUS | Status: AC
  Administered 2019-01-18: 10:00:00 via INTRAVENOUS

## 2019-01-18 MED ORDER — SODIUM CHLORIDE 0.9 % IV SOLN
20.0000 mg/m2 | Freq: Once | INTRAVENOUS | Status: AC
  Administered 2019-01-18: 46 mg via INTRAVENOUS
  Filled 2019-01-18: qty 4.6

## 2019-01-18 NOTE — Assessment & Plan Note (Addendum)
1.  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 - Abiraterone with prednisone from 12/02/2017 through January 2020 with progression.  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  No BRCA related mutations noted. -Last [name] 22.5 mg was on 12/14/2018. -4 cycles of cabazitaxel from 09/27/2018 through 12/21/2018, dose increased to 20 mg per metered square after cycle 1.  He is taking prednisone 10 mg daily. -PSA on 12/21/2018 was elevated at 25. -Bone scan and CT AP on 12/16/2018 showed stable findings.  No new areas were seen. -Increase in PSA could be from tumor lysis. -He tolerated last cycle reasonably well.  He has on and off numbness in the left hand.  Denies any numbness in the right hand or toes.  Has some chronic cough which is stable. -We reviewed his blood work today.  He can proceed with cycle 5 without any dose modifications. -He will come back in 4 weeks for his next cycle.  2.  Low back pain: -She will continue fentanyl 75 mcg patch. -He is requiring anywhere between 1-4 tablets of hydrocodone 5 mg daily for breakthrough pain.  3.  Bone metastasis: -He developed an ulcer on the right lower jaw alveolar ridge for the last 4 weeks. -Hence I will hold his denosumab today.  We will reevaluate in 4 weeks.  4.  Family history: - Maternal grandmother had stomach cancer.  Sister had stomach cancer.  Brother had some type of cancer. -He underwent genetic testing on 08/14/2018.  Germline mutation testing was negative.

## 2019-01-18 NOTE — Addendum Note (Signed)
Addended by: Donnie Aho on: 01/18/2019 09:57 AM   Modules accepted: Orders

## 2019-01-18 NOTE — Patient Instructions (Signed)
[facility] Discharge Instructions for Patients Receiving Chemotherapy  Today you received the following chemotherapy agents   To help prevent nausea and vomiting after your treatment, we encourage you to take your nausea medication   If you develop nausea and vomiting that is not controlled by your nausea medication, call the clinic.   BELOW ARE SYMPTOMS THAT SHOULD BE REPORTED IMMEDIATELY:  *FEVER GREATER THAN 100.5 F  *CHILLS WITH OR WITHOUT FEVER  NAUSEA AND VOMITING THAT IS NOT CONTROLLED WITH YOUR NAUSEA MEDICATION  *UNUSUAL SHORTNESS OF BREATH  *UNUSUAL BRUISING OR BLEEDING  TENDERNESS IN MOUTH AND THROAT WITH OR WITHOUT PRESENCE OF ULCERS  *URINARY PROBLEMS  *BOWEL PROBLEMS  UNUSUAL RASH Items with * indicate a potential emergency and should be followed up as soon as possible.  Feel free to call the clinic should you have any questions or concerns. The clinic phone number is [phone].  Please show the CHEMO ALERT CARD at check-in to the Emergency Department and triage nurse.   

## 2019-01-18 NOTE — Patient Instructions (Addendum)
[place] at [facility] Discharge Instructions  You were seen today by Dr. Delton Coombes. He went over your recent lab results. He will see you back in 4 weeks for labs and follow up.  Get you some Orajel to help with the sore in your mouth.  Thank you for choosing [place] at [facility] to provide your oncology and hematology care.  To afford each patient quality time with our provider, please arrive at least 15 minutes before your scheduled appointment time.   If you have a lab appointment with the [place] please come in thru the  Main Entrance and check in at the main information desk  You need to re-schedule your appointment should you arrive 10 or more minutes late.  We strive to give you quality time with our providers, and arriving late affects you and other patients whose appointments are after yours.  Also, if you no show three or more times for appointments you may be dismissed from the clinic at the providers discretion.     Again, thank you for choosing [facility].  Our hope is that these requests will decrease the amount of time that you wait before being seen by our physicians.       _____________________________________________________________  Should you have questions after your visit to [facility], please contact our office at [phone] between the hours of 8:00 a.m. and 4:30 p.m.  Voicemails left after 4:00 p.m. will not be returned until the following business day.  For prescription refill requests, have your pharmacy contact our office and allow 72 hours.    Cancer Center Support Programs:   > Cancer Support Group  2nd Tuesday of the month 1pm-2pm, Journey Room

## 2019-01-18 NOTE — Progress Notes (Signed)
Labs reviewed with MD today at office visit, hold xgeva today, proceed with treatment.

## 2019-01-18 NOTE — Progress Notes (Signed)
Labs from port.  

## 2019-01-18 NOTE — Progress Notes (Signed)
Matthew [name], [name] [id number]   CLINIC:  Medical Oncology/Hematology  PCP:  [name], [place] [address] [phone]   REASON FOR VISIT:  Follow-up for prostate cancer   BRIEF ONCOLOGIC HISTORY:  Oncology History  Prostate carcinoma ([place])  12/10/2015 Imaging   CTA chest, multiple thoracic and rib osseous lesions. no primary evident. Atherosclerosis including aortic and CAD   12/12/2015 Imaging   CT abdomen/pelvis with sclerotic osseous lesions througout the lumbar spine, sacrum, bilateral iliac bones and L proximal femur worrisome for sclerotic osseous mets,mild prostatomegaly, no LAD, Infrarenal 3.8 cm AAA   12/12/2015 Tumor Marker   PSA 153.85   12/18/2015 Imaging   Bone Scan Diffuse bony metastatic disease, posterior calvarium, sternum, thoracic, lumbar spine, bilateral ribs, L shoulder, bilateral bony pelvis, proximal L femur   12/24/2015 Initial Biopsy   CT biopsy of L iliac bone lesion   12/26/2015 Pathology Results   Bone, biopsy, left iliac - POSITIVE FOR METASTATIC ADENOCARCINOMA.   12/28/2015 - 09/01/2016 Chemotherapy   Firmagon instituted 240 mg    01/01/2016 Procedure   Port placed by IR.   01/07/2016 - 04/11/2016 Chemotherapy   Docetaxel every 21 days x 6 cycles with dose reductions due to tolerance   02/18/2016 Adverse Reaction   GI toxicity from docetaxel, dose reduced to 60 mg/m2   02/18/2016 Treatment Plan Change   Docetaxel dose reduced by 20%   03/06/2016 Procedure   Dr. Enrique Sack- Multiple extraction of tooth numbers 3, 5, 6, 8, 9, 10, 11, 12, 20, 21, 22, 27, 28, and 31. 4 Quadrants of alveoloplasty. Bilateral mandibular lingual tori reductions.   04/09/2016 Imaging   Brain MRI 1. No intracranial metastatic disease 2. No acute intracranial abnormality 3. Findings of chronic microvascular disease   05/14/2016 Imaging   CT C/A/P Stable appearing diffuse bony metastatic disease. No new  significant disease is seen Innumerable patchy sclerotic osseous metastases throughout the axial and proximal appendicular skeleton, stable in size and distribution, significantly increased in density in the interval, likely reflecting treatment effect. 2. No new or progressive metastatic disease in the chest, abdomen or pelvis. 3. Bilateral perifissural pulmonary nodules are stable and probably benign. 4. Aortic atherosclerosis. Infrarenal 4.3 cm abdominal aortic aneurysm, minimally increased in size. Recommend followup by ultrasound in 1 year.   06/02/2016 Tumor Marker   PSA 0.99 ng/ml    - 07/31/2016 Radiation Therapy   Palliative XRT by Dr. Lianne Cure in [place].    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  The Multi-Gene Panel offered by [facility] includes sequencing and/or deletion duplication testing of the following 85 genes: AIP, ALK, APC, ATM, AXIN2,BAP1,  BARD1, BLM, BMPR1A, BRCA1, BRCA2, BRIP1, CASR, CDC73, CDH1, CDK4, CDKN1B, CDKN1C, CDKN2A (p14ARF), CDKN2A (p16INK4a), CEBPA, CHEK2, CTNNA1, DICER1, DIS3L2, EGFR (c.2369C>T, p.Thr790Met variant only), EPCAM (Deletion/duplication testing only), FH, FLCN, GATA2, GPC3, GREM1 (Promoter region deletion/duplication testing only), HOXB13 (c.251G>A, p.Gly84Glu), HRAS, KIT, MAX, MEN1, MET, MITF (c.952G>A, p.Glu318Lys variant only), MLH1, MSH2, MSH3, MSH6, MUTYH, NBN, NF1, NF2, NTHL1, PALB2, PDGFRA, PHOX2B, PMS2, POLD1, POLE, POT1, PRKAR1A, PTCH1, PTEN, RAD50, RAD51C, RAD51D, RB1, RECQL4, RET, RNF43, RUNX1, SDHAF2, SDHA (sequence changes only), SDHB, SDHC, SDHD, SMAD4, SMARCA4, SMARCB1, SMARCE1, STK11, SUFU, TERC, TERT, TMEM127, TP53, TSC1, TSC2, VHL, WRN and WT1.  The report date is August 24, 2018  This  is not thought to increase the risk for colon cancer or other cancers, unless there is the presence of two  pathogenic mutations.   09/27/2018 -  Chemotherapy   The patient had palonosetron (ALOXI) injection 0.25 mg, 0.25 mg, Intravenous,  Once, 5 of 8 cycles Administration: 0.25 mg (09/27/2018), 0.25 mg (10/25/2018), 0.25 mg (11/23/2018), 0.25 mg (12/21/2018) pegfilgrastim (NEULASTA ONPRO KIT) injection 6 mg, 6 mg, Subcutaneous, Once, 4 of 7 cycles Administration: 6 mg (10/25/2018), 6 mg (11/23/2018), 6 mg (12/21/2018) pegfilgrastim-cbqv (UDENYCA) injection 6 mg, 6 mg, Subcutaneous, Once, 1 of 1 cycle Administration: 6 mg (09/29/2018) cabazitaxel (JEVTANA) 35 mg in dextrose 5 % 250 mL chemo infusion, 15 mg/m2 = 35 mg (100 % of original dose 15 mg/m2), Intravenous,  Once, 5 of 8 cycles Dose modification: 15 mg/m2 (original dose 15 mg/m2, Cycle 1, Reason: Provider Judgment), 20 mg/m2 (original dose 15 mg/m2, Cycle 2, Reason: Provider Judgment), 20 mg/m2 (original dose 20 mg/m2, Cycle 5, Reason: Patient Age, Comment: Change in jevtana orderable) Administration: 35 mg (09/27/2018), 46 mg (10/25/2018), 46 mg (11/23/2018), 46 mg (12/21/2018)  for chemotherapy treatment.    Bone metastases ([place])  12/26/2015 Initial Diagnosis   Bone metastases ([place])   09/27/2018 -  Chemotherapy   The patient had palonosetron (ALOXI) injection 0.25 mg, 0.25 mg, Intravenous,  Once, 4 of 6 cycles Administration: 0.25 mg (09/27/2018), 0.25 mg (10/25/2018), 0.25 mg (11/23/2018), 0.25 mg (12/21/2018) pegfilgrastim (NEULASTA ONPRO KIT) injection 6 mg, 6 mg, Subcutaneous, Once, 3 of 5 cycles Administration: 6 mg (10/25/2018), 6 mg (11/23/2018), 6 mg (12/21/2018) pegfilgrastim-cbqv (UDENYCA) injection 6 mg, 6 mg, Subcutaneous, Once, 1 of 1 cycle Administration: 6 mg (09/29/2018) cabazitaxel (JEVTANA) 35 mg in dextrose 5 % 250 mL chemo infusion, 15 mg/m2 = 35 mg (100 % of original dose 15 mg/m2), Intravenous,  Once, 4 of 6 cycles Dose modification: 15 mg/m2 (original dose 15 mg/m2, Cycle 1, Reason: Provider Judgment), 20 mg/m2 (original dose 15 mg/m2,  Cycle 2, Reason: Provider Judgment) Administration: 35 mg (09/27/2018), 46 mg (10/25/2018), 46 mg (11/23/2018), 46 mg (12/21/2018)  for chemotherapy treatment.       CANCER STAGING: Cancer Staging Prostate carcinoma [facility]) Staging form: Prostate, AJCC 7th Edition - Clinical stage from 12/24/2015: Stage IV (TX, N0, M1b, PSA: 20 or greater) - Signed by Baird Cancer, PA-C on 01/07/2016    INTERVAL HISTORY:  Matthew [name] 63 y.o. male returns for cycle 5 of chemotherapy.  Cycle 4 of chemotherapy was on 12/21/2018.  He did not experience any major nausea or vomiting.  He did have some diarrhea which was well controlled.  He has noticed some numbness in the left hand fingertips for the last few weeks.  He denies any numbness in the feet.  Appetite is 50%.  Energy levels are low.  Pain is well controlled with the current regimen.  On some days he is only requiring 1 tablet for breakthrough pain medicine.  On worst days he will take up to 4 tablets.  He is taking prednisone 10 mg daily.  Denies any fevers or chills.      REVIEW OF SYSTEMS:  Review of Systems  Constitutional: Positive for fatigue.  Gastrointestinal: Positive for vomiting. Negative for diarrhea.  Genitourinary: Positive for difficulty urinating.   Musculoskeletal: Positive for back pain.  Psychiatric/Behavioral: Positive for sleep disturbance.  All other systems reviewed and are negative.    PAST MEDICAL/SURGICAL HISTORY:  Past Medical History:  Diagnosis Date  . Arthritis    Left knee  .  Bone cancer ([place])   . Bone metastases ([place]) 12/26/2015  . Cancer [facility])    Prostate  . DVT (deep venous thrombosis) ([place]) 2004  . Family history of prostate cancer   . Family history of stomach cancer   . Kidney stones    ~2002  . Prostate carcinoma ([place]) 12/26/2015   Past Surgical History:  Procedure Laterality Date  . CIRCUMCISION  30 years ago  . MULTIPLE EXTRACTIONS WITH ALVEOLOPLASTY N/A 03/06/2016   Procedure: Extraction of tooth  #'s 3,5,6,8-12,20-22, 27, 28, and 31 with alveoloplasty and bilateral mandibular tori reductions;  Surgeon: Lenn Cal, DDS;  Location: [facility];  Service: Oral Surgery;  Laterality: N/A;     SOCIAL HISTORY:  Social History   Socioeconomic History  . Marital status: Single    Spouse name: Not on file  . Number of children: 2  . Years of education: Not on file  . Highest education level: Not on file  Occupational History  . Occupation: Research scientist (life sciences)  Social Needs  . Financial resource strain: Not on file  . Food insecurity    Worry: Not on file    Inability: Not on file  . Transportation needs    Medical: Not on file    Non-medical: Not on file  Tobacco Use  . Smoking status: Current Every Day Smoker    Packs/day: 1.00    Years: 50.00    Pack years: 50.00    Types: Cigarettes    Start date: 06/16/1965  . Smokeless tobacco: Former Systems developer    Types: [place] date: 08/04/2005  Substance and Sexual Activity  . Alcohol use: No    Alcohol/week: 0.0 standard drinks  . Drug use: Yes    Types: Marijuana    Comment: THC for appetite; occ  . Sexual activity: Not on file  Lifestyle  . Physical activity    Days per week: Not on file    Minutes per session: Not on file  . Stress: Not on file  Relationships  . Social Herbalist on phone: Not on file    Gets together: Not on file    Attends religious service: Not on file    Active member of club or organization: Not on file    Attends meetings of clubs or organizations: Not on file    Relationship status: Not on file  . Intimate partner violence    Fear of current or ex partner: Not on file    Emotionally abused: Not on file    Physically abused: Not on file    Forced sexual activity: Not on file  Other Topics Concern  . Not on file  Social History Narrative  . Not on file    FAMILY HISTORY:  Family History  Problem Relation Age of Onset  . Stomach cancer Mother        dx 30s  . Aneurysm  Maternal Aunt        brain  . Alcohol abuse Maternal Uncle   . Stomach cancer Maternal Grandmother        dx 50s-60s  . Prostate cancer Maternal Uncle        dx 60s    CURRENT MEDICATIONS:  Outpatient Encounter Medications as of 01/18/2019  Medication Sig  . abiraterone acetate (ZYTIGA) 250 MG tablet Take 4 tablets (1,000 mg total) by mouth daily. Take on an empty stomach 1 hour before or 2 hours after a meal  . Calcium Carb-Cholecalciferol (CALCIUM  500 + D3) 500-600 MG-UNIT TABS Take 2 tablets by mouth daily.  . chlorhexidine (PERIDEX) 0.12 % solution Rinse with 15 mls twice daily for 30 seconds. Spit out excess. Do not swallow.  . ergocalciferol (VITAMIN D2) 50000 units capsule Take 1 capsule (50,000 Units total) by mouth every Friday.  . fentaNYL (DURAGESIC) 75 MCG/HR Place 1 patch onto the skin every 3 (three) days.  Marland Kitchen HYDROcodone-acetaminophen (NORCO) 10-325 MG tablet Take 1 tablet by mouth every 4 (four) hours as needed.  . lidocaine-prilocaine (EMLA) cream Apply a quarter size amount to port site 1 hour prior to chemo. Do not rub in. Cover with plastic wrap.  . lubiprostone (AMITIZA) 24 MCG capsule Take 1 capsule (24 mcg total) by mouth 2 (two) times daily with a meal.  . omeprazole (PRILOSEC) 20 MG capsule Take 1 capsule by mouth once daily  . ondansetron (ZOFRAN) 8 MG tablet TAKE ONE TABLET BY MOUTH EVERY 8 HOURS AS NEEDED FOR NAUSEA OR VOMITING  . predniSONE (DELTASONE) 10 MG tablet Take 1 tablet (10 mg total) by mouth daily.  . prochlorperazine (COMPAZINE) 10 MG tablet Take 1 tablet (10 mg total) by mouth every 6 (six) hours as needed for nausea or vomiting.  . tamsulosin (FLOMAX) 0.4 MG CAPS capsule Take 1 capsule (0.4 mg total) by mouth at bedtime.  . temazepam (RESTORIL) 15 MG capsule Take 1 capsule (15 mg total) by mouth at bedtime as needed for sleep.  . Wheat Dextrin (BENEFIBER PO) Take by mouth as needed.  . [DISCONTINUED] predniSONE (DELTASONE) 10 MG tablet Take 1 tablet  (10 mg total) by mouth daily.  . [DISCONTINUED] predniSONE (DELTASONE) 5 MG tablet TAKE 1 TABLET BY MOUTH TWICE DAILY WITH A MEAL  . [DISCONTINUED] prochlorperazine (COMPAZINE) 10 MG tablet Take 1 tablet (10 mg total) by mouth every 6 (six) hours as needed (Nausea or vomiting).   No facility-administered encounter medications on file as of 01/18/2019.     ALLERGIES:  Allergies  Allergen Reactions  . Meloxicam Other (See Comments)    Caused stomach bleeding.     PHYSICAL EXAM:  ECOG Performance status: 1  Vitals:   01/18/19 0800  BP: 115/77  Pulse: 86  Resp: 16  Temp: 98.9 F (37.2 C)  SpO2: 98%   Filed Weights   01/18/19 0800  Weight: 235 lb 8 oz (106.8 kg)    Physical Exam Vitals signs reviewed.  Constitutional:      Appearance: Normal appearance.  Cardiovascular:     Rate and Rhythm: Normal rate and regular rhythm.     Heart sounds: Normal heart sounds.  Pulmonary:     Effort: Pulmonary effort is normal.     Breath sounds: Normal breath sounds.  Abdominal:     General: There is no distension.     Palpations: Abdomen is soft. There is no mass.  Musculoskeletal:        General: No swelling.  Skin:    General: Skin is warm.  Neurological:     General: No focal deficit present.     Mental Status: He is alert and oriented to person, place, and time.  Psychiatric:        Mood and Affect: Mood normal.        Behavior: Behavior normal.    There is an ulcer on the right alveolar ridge in the lower jaw.  LABORATORY DATA:  I have reviewed the labs as listed.  CBC    Component Value Date/Time   WBC 5.8  01/18/2019 0839   RBC 4.05 (L) 01/18/2019 0839   HGB 12.5 (L) 01/18/2019 0839   HCT 38.9 (L) 01/18/2019 0839   PLT 246 01/18/2019 0839   MCV 96.0 01/18/2019 0839   MCH 30.9 01/18/2019 0839   MCHC 32.1 01/18/2019 0839   RDW 14.3 01/18/2019 0839   LYMPHSABS 0.8 01/18/2019 0839   MONOABS 0.4 01/18/2019 0839   EOSABS 0.1 01/18/2019 0839   BASOSABS 0.0  01/18/2019 0839   CMP Latest Ref Rng & Units 01/18/2019 12/21/2018 11/23/2018  Glucose 70 - 99 mg/dL 128(H) 123(H) 126(H)  BUN 8 - 23 mg/dL _0 Creatinine 0.61 - 1.24 mg/dL 0.97 0.99 1.15  Sodium 135 - 145 mmol/L 143 136 137  Potassium 3.5 - 5.1 mmol/L 3.6 4.6 3.9  Chloride 98 - 111 mmol/L 107 101 104  CO2 22 - 32 mmol/L _1 Calcium 8.9 - 10.3 mg/dL 8.9 8.6(L) 8.5(L)  Total Protein 6.5 - 8.1 g/dL 6.2(L) 6.3(L) 6.6  Total Bilirubin 0.3 - 1.2 mg/dL 0.2(L) 0.4 0.4  Alkaline Phos 38 - 126 U/L 63 59 60  AST 15 - 41 U/L 11(L) 14(L) 14(L)  ALT 0 - 44 U/L _2 DIAGNOSTIC IMAGING:  I have independently reviewed the scans and discussed with the patient.   I have reviewed Venita Lick LPN's note and agree with the documentation.  I personally performed a face-to-face visit, made revisions and my assessment and plan is as follows.    ASSESSMENT & PLAN:   Prostate carcinoma ([place]) 1.  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 - Abiraterone with prednisone from 12/02/2017 through January 2020 with progression.  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  No BRCA related mutations noted. -Last [name] 22.5 mg was on 12/14/2018. -4 cycles of cabazitaxel from 09/27/2018 through 12/21/2018, dose increased to 20 mg per metered square after cycle 1.  He is taking prednisone 10 mg daily. -PSA on 12/21/2018 was elevated at 25. -Bone scan and CT AP on 12/16/2018 showed stable findings.  No new areas were seen. -Increase in PSA could be from tumor lysis. -He tolerated last cycle reasonably well.  He has on and off numbness in the left hand.  Denies any numbness in the right hand or toes.  Has some chronic cough which is stable. -We reviewed his blood work today.  He can proceed with cycle 5 without any dose modifications. -He will come  back in 4 weeks for his next cycle.  2.  Low back pain: -She will continue fentanyl 75 mcg patch. -He is requiring anywhere between 1-4 tablets of hydrocodone 5 mg daily for breakthrough pain.  3.  Bone metastasis: -He developed an ulcer on the right lower jaw alveolar ridge for the last 4 weeks. -Hence I will hold his denosumab today.  We will reevaluate in 4 weeks.  4.  Family history: - Maternal grandmother had stomach cancer.  Sister had stomach cancer.  Brother had some type of cancer. -He underwent genetic testing on 08/14/2018.  Germline mutation testing was negative.  Total time spent is 25 minutes with more than 50% of the time spent face-to-face discussing scan results, treatment plan and coordination of care.    Orders placed this encounter:  No orders of the defined types were placed in this encounter.     Derek Jack,  MD [facility] [phone]    

## 2019-01-18 NOTE — Addendum Note (Signed)
Addended by: Donnie Aho on: 01/18/2019 09:28 AM   Modules accepted: Orders

## 2019-01-18 NOTE — Progress Notes (Signed)
..  Matthew [name] arrived today for [employer] on body injector. See MAR for administration details. Injector in place and engaged with green light indicator on flashing. Tolerated application with out problems.  Treatment given per orders. Patient tolerated it well without problems. Vitals stable and discharged home from clinic ambulatory. Follow up as scheduled.

## 2019-02-15 ENCOUNTER — Inpatient Hospital Stay (HOSPITAL_COMMUNITY): Payer: Medicare Other

## 2019-02-15 ENCOUNTER — Inpatient Hospital Stay (HOSPITAL_COMMUNITY): Payer: Medicare Other | Attending: Hematology

## 2019-02-15 ENCOUNTER — Inpatient Hospital Stay (HOSPITAL_BASED_OUTPATIENT_CLINIC_OR_DEPARTMENT_OTHER): Payer: Medicare Other | Admitting: Hematology

## 2019-02-15 ENCOUNTER — Other Ambulatory Visit: Payer: Self-pay

## 2019-02-15 ENCOUNTER — Encounter (HOSPITAL_COMMUNITY): Payer: Self-pay | Admitting: Hematology

## 2019-02-15 VITALS — BP 111/80 | HR 90 | Temp 97.8°F | Resp 18 | Wt 235.2 lb

## 2019-02-15 VITALS — BP 111/69 | HR 72 | Resp 16

## 2019-02-15 DIAGNOSIS — Z5111 Encounter for antineoplastic chemotherapy: Secondary | ICD-10-CM | POA: Diagnosis not present

## 2019-02-15 DIAGNOSIS — C61 Malignant neoplasm of prostate: Secondary | ICD-10-CM

## 2019-02-15 DIAGNOSIS — Z79899 Other long term (current) drug therapy: Secondary | ICD-10-CM

## 2019-02-15 DIAGNOSIS — K121 Other forms of stomatitis: Secondary | ICD-10-CM | POA: Diagnosis not present

## 2019-02-15 DIAGNOSIS — Z800 Family history of malignant neoplasm of digestive organs: Secondary | ICD-10-CM

## 2019-02-15 DIAGNOSIS — C7951 Secondary malignant neoplasm of bone: Secondary | ICD-10-CM

## 2019-02-15 DIAGNOSIS — G893 Neoplasm related pain (acute) (chronic): Secondary | ICD-10-CM

## 2019-02-15 DIAGNOSIS — F17210 Nicotine dependence, cigarettes, uncomplicated: Secondary | ICD-10-CM

## 2019-02-15 DIAGNOSIS — Z809 Family history of malignant neoplasm, unspecified: Secondary | ICD-10-CM

## 2019-02-15 LAB — CBC WITH DIFFERENTIAL/PLATELET
Abs Immature Granulocytes: 0.01 10*3/uL (ref 0.00–0.07)
Basophils Absolute: 0 10*3/uL (ref 0.0–0.1)
Basophils Relative: 1 %
Eosinophils Absolute: 0.2 10*3/uL (ref 0.0–0.5)
Eosinophils Relative: 5 %
HCT: 39 % (ref 39.0–52.0)
Hemoglobin: 12.4 g/dL — ABNORMAL LOW (ref 13.0–17.0)
Immature Granulocytes: 0 %
Lymphocytes Relative: 23 %
Lymphs Abs: 1 10*3/uL (ref 0.7–4.0)
MCH: 30.5 pg (ref 26.0–34.0)
MCHC: 31.8 g/dL (ref 30.0–36.0)
MCV: 95.8 fL (ref 80.0–100.0)
Monocytes Absolute: 0.4 10*3/uL (ref 0.1–1.0)
Monocytes Relative: 9 %
Neutro Abs: 2.7 10*3/uL (ref 1.7–7.7)
Neutrophils Relative %: 62 %
Platelets: 223 10*3/uL (ref 150–400)
RBC: 4.07 MIL/uL — ABNORMAL LOW (ref 4.22–5.81)
RDW: 14.3 % (ref 11.5–15.5)
WBC: 4.4 10*3/uL (ref 4.0–10.5)
nRBC: 0 % (ref 0.0–0.2)

## 2019-02-15 LAB — COMPREHENSIVE METABOLIC PANEL
ALT: 13 U/L (ref 0–44)
AST: 13 U/L — ABNORMAL LOW (ref 15–41)
Albumin: 3.5 g/dL (ref 3.5–5.0)
Alkaline Phosphatase: 56 U/L (ref 38–126)
Anion gap: 8 (ref 5–15)
BUN: 11 mg/dL (ref 8–23)
CO2: 25 mmol/L (ref 22–32)
Calcium: 8.6 mg/dL — ABNORMAL LOW (ref 8.9–10.3)
Chloride: 107 mmol/L (ref 98–111)
Creatinine, Ser: 1.13 mg/dL (ref 0.61–1.24)
GFR calc Af Amer: >60 mL/min (ref >60)
GFR calc non Af Amer: >60 mL/min (ref >60)
Glucose, Bld: 110 mg/dL — ABNORMAL HIGH (ref 70–99)
Potassium: 3.7 mmol/L (ref 3.5–5.1)
Sodium: 140 mmol/L (ref 135–145)
Total Bilirubin: 0.5 mg/dL (ref 0.3–1.2)
Total Protein: 6.1 g/dL — ABNORMAL LOW (ref 6.5–8.1)

## 2019-02-15 LAB — PSA: Prostatic Specific Antigen: 36.9 ng/mL — ABNORMAL HIGH (ref 0.00–4.00)

## 2019-02-15 LAB — MAGNESIUM: Magnesium: 1.8 mg/dL (ref 1.7–2.4)

## 2019-02-15 MED ORDER — PALONOSETRON HCL INJECTION 0.25 MG/5ML
0.2500 mg | Freq: Once | INTRAVENOUS | Status: AC
  Administered 2019-02-15: 0.25 mg via INTRAVENOUS
  Filled 2019-02-15: qty 5

## 2019-02-15 MED ORDER — HEPARIN SOD (PORK) LOCK FLUSH 100 UNIT/ML IV SOLN
500.0000 [IU] | Freq: Once | INTRAVENOUS | Status: AC | PRN
  Administered 2019-02-15: 500 [IU]

## 2019-02-15 MED ORDER — FENTANYL 75 MCG/HR TD PT72: 1.0000 | MEDICATED_PATCH | TRANSDERMAL | 0 refills | Status: DC

## 2019-02-15 MED ORDER — SODIUM CHLORIDE 0.9 % IV SOLN
20.0000 mg/m2 | Freq: Once | INTRAVENOUS | Status: AC
  Administered 2019-02-15: 12:00:00 46 mg via INTRAVENOUS
  Filled 2019-02-15: qty 4.6

## 2019-02-15 MED ORDER — SODIUM CHLORIDE 0.9% FLUSH
10.0000 mL | INTRAVENOUS | Status: DC | PRN
  Administered 2019-02-15: 10 mL
  Filled 2019-02-15: qty 10

## 2019-02-15 MED ORDER — SODIUM CHLORIDE 0.9 % IV SOLN
10.0000 mg | Freq: Once | INTRAVENOUS | Status: AC
  Administered 2019-02-15: 10 mg via INTRAVENOUS
  Filled 2019-02-15: qty 10

## 2019-02-15 MED ORDER — FAMOTIDINE IN NACL 20-0.9 MG/50ML-% IV SOLN
20.0000 mg | Freq: Once | INTRAVENOUS | Status: AC
  Administered 2019-02-15: 20 mg via INTRAVENOUS
  Filled 2019-02-15: qty 50

## 2019-02-15 MED ORDER — PEGFILGRASTIM 6 MG/0.6ML ~~LOC~~ PSKT
6.0000 mg | PREFILLED_SYRINGE | Freq: Once | SUBCUTANEOUS | Status: AC
  Administered 2019-02-15: 6 mg via SUBCUTANEOUS
  Filled 2019-02-15: qty 0.6

## 2019-02-15 MED ORDER — DIPHENHYDRAMINE HCL 50 MG/ML IJ SOLN
25.0000 mg | Freq: Once | INTRAMUSCULAR | Status: AC
  Administered 2019-02-15: 25 mg via INTRAVENOUS
  Filled 2019-02-15: qty 1

## 2019-02-15 MED ORDER — SODIUM CHLORIDE 0.9 % IV SOLN
Freq: Once | INTRAVENOUS | Status: AC
  Administered 2019-02-15: 10:00:00 via INTRAVENOUS

## 2019-02-15 MED ORDER — HYDROCODONE-ACETAMINOPHEN 10-325 MG PO TABS: 1.0000 | ORAL_TABLET | ORAL | 0 refills | Status: DC | PRN

## 2019-02-15 NOTE — Progress Notes (Signed)
To treatment room for oncology follow up and chemotherapy.  Patient stated he still has a sore inside his right, lower jaw gum line but no worsening. No changes in in neuropathy for toes.   No s/s of distress.    Patient seen by Dr. Delton Coombes with lab review and ok to treat today and no xgeva verbal order.    Patient tolerated chemotherapy with no complaints voiced.  Port site clean and dry with no bruising or swelling noted at site.  Good blood return noted before and after administration of chemotherapy.  Band aid applied.  Neulasta onpro applied to right arm with green indicator light flashing.  Patient left ambulatory with VSS and no s/s of distress noted.

## 2019-02-15 NOTE — Assessment & Plan Note (Signed)
1.  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 - Abiraterone with prednisone from 12/02/2017 through January 2020 with progression.  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  No BRCA related mutations noted. -Last [name] 22.5 mg was on 12/14/2018. -5 cycles of cabazitaxel from 09/27/2018 through 01/18/2019.  He is taking prednisone 10 mg daily. -PSA on 12/21/2018 was elevated at 25. -Bone scan and CT AP on 12/16/2018 showed stable findings with no new areas. - We have reviewed his blood work.  He is tolerating chemotherapy very well.  He may proceed with his next cycle today.  I plan to repeat scans after next cycle.  2.  Low back pain: - He will continue fentanyl 75 mcg patch. -He is requiring between 1-6 tablets of hydrocodone for breakthrough pain daily.  3.  Bone mets: -he developed ulcers in the right lower jaw alveolar ridge for the last few weeks. -We will continue to hold his denosumab today.  4.  Family history: - Maternal grandmother had stomach cancer.  Sister had stomach cancer.  Brother had some type of cancer. -He underwent genetic testing on 08/14/2018.  Germline mutation testing was negative.

## 2019-02-15 NOTE — Patient Instructions (Addendum)
[facility] at [facility] Discharge Instructions  You were seen today by Dr. Katragadda. He went over your recent lab results. He will see you back in 4 weeks for labs and follow up.   Thank you for choosing  [facility] at [facility] to provide your oncology and hematology care.  To afford each patient quality time with our provider, please arrive at least 15 minutes before your scheduled appointment time.   If you have a lab appointment with the Cancer Center please come in thru the  Main Entrance and check in at the main information desk  You need to re-schedule your appointment should you arrive 10 or more minutes late.  We strive to give you quality time with our providers, and arriving late affects you and other patients whose appointments are after yours.  Also, if you no show three or more times for appointments you may be dismissed from the clinic at the providers discretion.     Again, thank you for choosing [facility].  Our hope is that these requests will decrease the amount of time that you wait before being seen by our physicians.       _____________________________________________________________  Should you have questions after your visit to [facility], please contact our office at [phone] between the hours of 8:00 a.m. and 4:30 p.m.  Voicemails left after 4:00 p.m. will not be returned until the following business day.  For prescription refill requests, have your pharmacy contact our office and allow 72 hours.    Cancer Center Support Programs:   > Cancer Support Group  2nd Tuesday of the month 1pm-2pm, Journey Room    

## 2019-02-15 NOTE — Progress Notes (Signed)
[address], [name] [address]   CLINIC:  Medical Oncology/Hematology  PCP:  [name], [place] [address] [phone]   REASON FOR VISIT:  Follow-up for prostate cancer   BRIEF ONCOLOGIC HISTORY:  Oncology History  Prostate carcinoma ([place])  12/10/2015 Imaging   CTA chest, multiple thoracic and rib osseous lesions. no primary evident. Atherosclerosis including aortic and CAD   12/12/2015 Imaging   CT abdomen/pelvis with sclerotic osseous lesions througout the lumbar spine, sacrum, bilateral iliac bones and L proximal femur worrisome for sclerotic osseous mets,mild prostatomegaly, no LAD, Infrarenal 3.8 cm AAA   12/12/2015 Tumor Marker   PSA 153.85   12/18/2015 Imaging   Bone Scan Diffuse bony metastatic disease, posterior calvarium, sternum, thoracic, lumbar spine, bilateral ribs, L shoulder, bilateral bony pelvis, proximal L femur   12/24/2015 Initial Biopsy   CT biopsy of L iliac bone lesion   12/26/2015 Pathology Results   Bone, biopsy, left iliac - POSITIVE FOR METASTATIC ADENOCARCINOMA.   12/28/2015 - 09/01/2016 Chemotherapy   Firmagon instituted 240 mg    01/01/2016 Procedure   Port placed by IR.   01/07/2016 - 04/11/2016 Chemotherapy   Docetaxel every 21 days x 6 cycles with dose reductions due to tolerance   02/18/2016 Adverse Reaction   GI toxicity from docetaxel, dose reduced to 60 mg/m2   02/18/2016 Treatment Plan Change   Docetaxel dose reduced by 20%   03/06/2016 Procedure   Dr. Enrique Sack- Multiple extraction of tooth numbers 3, 5, 6, 8, 9, 10, 11, 12, 20, 21, 22, 27, 28, and 31. 4 Quadrants of alveoloplasty. Bilateral mandibular lingual tori reductions.   04/09/2016 Imaging   Brain MRI 1. No intracranial metastatic disease 2. No acute intracranial abnormality 3. Findings of chronic microvascular disease   05/14/2016 Imaging   CT C/A/P Stable appearing diffuse bony metastatic disease. No new  significant disease is seen Innumerable patchy sclerotic osseous metastases throughout the axial and proximal appendicular skeleton, stable in size and distribution, significantly increased in density in the interval, likely reflecting treatment effect. 2. No new or progressive metastatic disease in the chest, abdomen or pelvis. 3. Bilateral perifissural pulmonary nodules are stable and probably benign. 4. Aortic atherosclerosis. Infrarenal 4.3 cm abdominal aortic aneurysm, minimally increased in size. Recommend followup by ultrasound in 1 year.   06/02/2016 Tumor Marker   PSA 0.99 ng/ml    - 07/31/2016 Radiation Therapy   Palliative XRT by Dr. Lianne Cure in [place].    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  The Multi-Gene Panel offered by [facility] includes sequencing and/or deletion duplication testing of the following 85 genes: AIP, ALK, APC, ATM, AXIN2,BAP1,  BARD1, BLM, BMPR1A, BRCA1, BRCA2, BRIP1, CASR, CDC73, CDH1, CDK4, CDKN1B, CDKN1C, CDKN2A (p14ARF), CDKN2A (p16INK4a), CEBPA, CHEK2, CTNNA1, DICER1, DIS3L2, EGFR (c.2369C>T, p.Thr790Met variant only), EPCAM (Deletion/duplication testing only), FH, FLCN, GATA2, GPC3, GREM1 (Promoter region deletion/duplication testing only), HOXB13 (c.251G>A, p.Gly84Glu), HRAS, KIT, MAX, MEN1, MET, MITF (c.952G>A, p.Glu318Lys variant only), MLH1, MSH2, MSH3, MSH6, MUTYH, NBN, NF1, NF2, NTHL1, PALB2, PDGFRA, PHOX2B, PMS2, POLD1, POLE, POT1, PRKAR1A, PTCH1, PTEN, RAD50, RAD51C, RAD51D, RB1, RECQL4, RET, RNF43, RUNX1, SDHAF2, SDHA (sequence changes only), SDHB, SDHC, SDHD, SMAD4, SMARCA4, SMARCB1, SMARCE1, STK11, SUFU, TERC, TERT, TMEM127, TP53, TSC1, TSC2, VHL, WRN and WT1.  The report date is August 24, 2018  This  is not thought to increase the risk for colon cancer or other cancers, unless there is the presence of two  pathogenic mutations.   09/27/2018 -  Chemotherapy   The patient had palonosetron (ALOXI) injection 0.25 mg, 0.25 mg, Intravenous,  Once, 6 of 8 cycles Administration: 0.25 mg (09/27/2018), 0.25 mg (10/25/2018), 0.25 mg (11/23/2018), 0.25 mg (12/21/2018), 0.25 mg (01/18/2019), 0.25 mg (02/15/2019) pegfilgrastim (NEULASTA ONPRO KIT) injection 6 mg, 6 mg, Subcutaneous, Once, 5 of 7 cycles Administration: 6 mg (10/25/2018), 6 mg (11/23/2018), 6 mg (12/21/2018), 6 mg (01/18/2019), 6 mg (02/15/2019) pegfilgrastim-cbqv (UDENYCA) injection 6 mg, 6 mg, Subcutaneous, Once, 1 of 1 cycle Administration: 6 mg (09/29/2018) cabazitaxel (JEVTANA) 35 mg in dextrose 5 % 250 mL chemo infusion, 15 mg/m2 = 35 mg (100 % of original dose 15 mg/m2), Intravenous,  Once, 6 of 8 cycles Dose modification: 15 mg/m2 (original dose 15 mg/m2, Cycle 1, Reason: Provider Judgment), 20 mg/m2 (original dose 15 mg/m2, Cycle 2, Reason: Provider Judgment), 20 mg/m2 (original dose 20 mg/m2, Cycle 5, Reason: Patient Age, Comment: Change in jevtana orderable) Administration: 35 mg (09/27/2018), 46 mg (10/25/2018), 46 mg (11/23/2018), 46 mg (12/21/2018), 46 mg (01/18/2019), 46 mg (02/15/2019)  for chemotherapy treatment.    Bone metastases ([facility])  12/26/2015 Initial Diagnosis   Bone metastases ([place])   09/27/2018 -  Chemotherapy   The patient had palonosetron (ALOXI) injection 0.25 mg, 0.25 mg, Intravenous,  Once, 4 of 6 cycles Administration: 0.25 mg (09/27/2018), 0.25 mg (10/25/2018), 0.25 mg (11/23/2018), 0.25 mg (12/21/2018) pegfilgrastim (NEULASTA ONPRO KIT) injection 6 mg, 6 mg, Subcutaneous, Once, 3 of 5 cycles Administration: 6 mg (10/25/2018), 6 mg (11/23/2018), 6 mg (12/21/2018) pegfilgrastim-cbqv (UDENYCA) injection 6 mg, 6 mg, Subcutaneous, Once, 1 of 1 cycle Administration: 6 mg (09/29/2018) cabazitaxel (JEVTANA) 35 mg in dextrose 5 % 250 mL chemo infusion, 15 mg/m2 = 35 mg (100 % of original dose 15 mg/m2), Intravenous,  Once, 4 of 6 cycles Dose  modification: 15 mg/m2 (original dose 15 mg/m2, Cycle 1, Reason: Provider Judgment), 20 mg/m2 (original dose 15 mg/m2, Cycle 2, Reason: Provider Judgment) Administration: 35 mg (09/27/2018), 46 mg (10/25/2018), 46 mg (11/23/2018), 46 mg (12/21/2018)  for chemotherapy treatment.       CANCER STAGING: Cancer Staging Prostate carcinoma [facility]) Staging form: Prostate, AJCC 7th Edition - Clinical stage from 12/24/2015: Stage IV (TX, N0, M1b, PSA: 20 or greater) - Signed by Baird Cancer, PA-C on 01/07/2016    INTERVAL HISTORY:  Mr. Mercadel 63 y.o. male seen prior to cycle 6 of chemotherapy.  He received cycle 5 on 01/18/2019.  Denies any neuropathy.  Denies any nausea vomiting or diarrhea after his chemotherapy.  He is able to work outdoors.  He is using fentanyl patch and hydrocodone for his pain in the back.  Some days he requires 1 tablet of hydrocodone on some days he requires 6 tablets.  He is continuing to have some ulcer in the lower jaw.  Sleep problems are stable.  Denies any fevers or infections.      REVIEW OF SYSTEMS:  Review of Systems  Constitutional: Positive for fatigue.  Respiratory: Positive for shortness of breath.   Gastrointestinal: Negative for diarrhea and vomiting.  Genitourinary: Negative for difficulty urinating.   Musculoskeletal: Positive for back pain.  Psychiatric/Behavioral: Positive for sleep disturbance.  All other systems reviewed and are negative.    PAST MEDICAL/SURGICAL HISTORY:  Past Medical History:  Diagnosis Date  . Arthritis    Left knee  .  Bone cancer ([place])   . Bone metastases ([place]) 12/26/2015  . Cancer [facility])    Prostate  . DVT (deep venous thrombosis) ([place]) 2004  . Family history of prostate cancer   . Family history of stomach cancer   . Kidney stones    ~2002  . Prostate carcinoma ([facility]) 12/26/2015   Past Surgical History:  Procedure Laterality Date  . CIRCUMCISION  30 years ago  . MULTIPLE EXTRACTIONS WITH ALVEOLOPLASTY N/A 03/06/2016    Procedure: Extraction of tooth #'s 3,5,6,8-12,20-22, 27, 28, and 31 with alveoloplasty and bilateral mandibular tori reductions;  Surgeon: Lenn Cal, DDS;  Location: [facility];  Service: Oral Surgery;  Laterality: N/A;     SOCIAL HISTORY:  Social History   Socioeconomic History  . Marital status: Single    Spouse name: Not on file  . Number of children: 2  . Years of education: Not on file  . Highest education level: Not on file  Occupational History  . Occupation: Research scientist (life sciences)  Social Needs  . Financial resource strain: Not on file  . Food insecurity    Worry: Not on file    Inability: Not on file  . Transportation needs    Medical: Not on file    Non-medical: Not on file  Tobacco Use  . Smoking status: Current Every Day Smoker    Packs/day: 1.00    Years: 50.00    Pack years: 50.00    Types: Cigarettes    Start date: 06/16/1965  . Smokeless tobacco: Former Systems developer    Types: [place] date: 08/04/2005  Substance and Sexual Activity  . Alcohol use: No    Alcohol/week: 0.0 standard drinks  . Drug use: Yes    Types: Marijuana    Comment: THC for appetite; occ  . Sexual activity: Not on file  Lifestyle  . Physical activity    Days per week: Not on file    Minutes per session: Not on file  . Stress: Not on file  Relationships  . Social Herbalist on phone: Not on file    Gets together: Not on file    Attends religious service: Not on file    Active member of club or organization: Not on file    Attends meetings of clubs or organizations: Not on file    Relationship status: Not on file  . Intimate partner violence    Fear of current or ex partner: Not on file    Emotionally abused: Not on file    Physically abused: Not on file    Forced sexual activity: Not on file  Other Topics Concern  . Not on file  Social History Narrative  . Not on file    FAMILY HISTORY:  Family History  Problem Relation Age of Onset  . Stomach cancer Mother         dx 53s  . Aneurysm Maternal Aunt        brain  . Alcohol abuse Maternal Uncle   . Stomach cancer Maternal Grandmother        dx 50s-60s  . Prostate cancer Maternal Uncle        dx 34s    CURRENT MEDICATIONS:  Outpatient Encounter Medications as of 02/15/2019  Medication Sig  . abiraterone acetate (ZYTIGA) 250 MG tablet Take 4 tablets (1,000 mg total) by mouth daily. Take on an empty stomach 1 hour before or 2 hours after a meal  . Calcium Carb-Cholecalciferol (CALCIUM  500 + D3) 500-600 MG-UNIT TABS Take 2 tablets by mouth daily.  . chlorhexidine (PERIDEX) 0.12 % solution Rinse with 15 mls twice daily for 30 seconds. Spit out excess. Do not swallow.  . ergocalciferol (VITAMIN D2) 50000 units capsule Take 1 capsule (50,000 Units total) by mouth every Friday.  . fentaNYL (DURAGESIC) 75 MCG/HR Place 1 patch onto the skin every 3 (three) days.  Mar[place] Kitchen HYDROcodone-acetaminophen (NORCO) 10-325 MG tablet Take 1 tablet by mouth every 4 (four) hours as needed.  . lidocaine-prilocaine (EMLA) cream Apply a quarter size amount to port site 1 hour prior to chemo. Do not rub in. Cover with plastic wrap.  . lubiprostone (AMITIZA) 24 MCG capsule Take 1 capsule (24 mcg total) by mouth 2 (two) times daily with a meal.  . omeprazole (PRILOSEC) 20 MG capsule Take 1 capsule by mouth once daily  . ondansetron (ZOFRAN) 8 MG tablet TAKE ONE TABLET BY MOUTH EVERY 8 HOURS AS NEEDED FOR NAUSEA OR VOMITING  . predniSONE (DELTASONE) 10 MG tablet Take 1 tablet (10 mg total) by mouth daily.  . prochlorperazine (COMPAZINE) 10 MG tablet Take 1 tablet (10 mg total) by mouth every 6 (six) hours as needed for nausea or vomiting.  . tamsulosin (FLOMAX) 0.4 MG CAPS capsule Take 1 capsule (0.4 mg total) by mouth at bedtime.  . temazepam (RESTORIL) 15 MG capsule Take 1 capsule (15 mg total) by mouth at bedtime as needed for sleep.  . Wheat Dextrin (BENEFIBER PO) Take by mouth as needed.  . [DISCONTINUED] fentaNYL (DURAGESIC) 75  MCG/HR Place 1 patch onto the skin every 3 (three) days.  . [DISCONTINUED] HYDROcodone-acetaminophen (NORCO) 10-325 MG tablet Take 1 tablet by mouth every 4 (four) hours as needed.   Facility-Administered Encounter Medications as of 02/15/2019  Medication  . sodium chloride flush (NS) 0.9 % injection 10 mL    ALLERGIES:  Allergies  Allergen Reactions  . Meloxicam Other (See Comments)    Caused stomach bleeding.     PHYSICAL EXAM:  ECOG Performance status: 1  Vitals:   02/15/19 0908  BP: 111/80  Pulse: 90  Resp: 18  Temp: 97.8 F (36.6 C)  SpO2: 99%   Filed Weights   02/15/19 0908  Weight: 235 lb 3.2 oz (106.7 kg)    Physical Exam Vitals signs reviewed.  Constitutional:      Appearance: Normal appearance.  Cardiovascular:     Rate and Rhythm: Normal rate and regular rhythm.     Heart sounds: Normal heart sounds.  Pulmonary:     Effort: Pulmonary effort is normal.     Breath sounds: Normal breath sounds.  Abdominal:     General: There is no distension.     Palpations: Abdomen is soft. There is no mass.  Musculoskeletal:        General: No swelling.  Skin:    General: Skin is warm.  Neurological:     General: No focal deficit present.     Mental Status: He is alert and oriented to person, place, and time.  Psychiatric:        Mood and Affect: Mood normal.        Behavior: Behavior normal.    There is an ulcer on the right alveolar ridge in the lower jaw.  LABORATORY DATA:  I have reviewed the labs as listed.  CBC    Component Value Date/Time   WBC 4.4 02/15/2019 0903   RBC 4.07 (L) 02/15/2019 0903   HGB 12.4 (L) 02/15/2019  0903   HCT 39.0 02/15/2019 0903   PLT 223 02/15/2019 0903   MCV 95.8 02/15/2019 0903   MCH 30.5 02/15/2019 0903   MCHC 31.8 02/15/2019 0903   RDW 14.3 02/15/2019 0903   LYMPHSABS 1.0 02/15/2019 0903   MONOABS 0.4 02/15/2019 0903   EOSABS 0.2 02/15/2019 0903   BASOSABS 0.0 02/15/2019 0903   CMP Latest Ref Rng & Units  02/15/2019 01/18/2019 12/21/2018  Glucose 70 - 99 mg/dL 110(H) 128(H) 123(H)  BUN 8 - 23 mg/dL _0 Creatinine 0.61 - 1.24 mg/dL 1.13 0.97 0.99  Sodium 135 - 145 mmol/L 140 143 136  Potassium 3.5 - 5.1 mmol/L 3.7 3.6 4.6  Chloride 98 - 111 mmol/L 107 107 101  CO2 22 - 32 mmol/L _1 Calcium 8.9 - 10.3 mg/dL 8.6(L) 8.9 8.6(L)  Total Protein 6.5 - 8.1 g/dL 6.1(L) 6.2(L) 6.3(L)  Total Bilirubin 0.3 - 1.2 mg/dL 0.5 0.2(L) 0.4  Alkaline Phos 38 - 126 U/L 56 63 59  AST 15 - 41 U/L 13(L) 11(L) 14(L)  ALT 0 - 44 U/L _2 DIAGNOSTIC IMAGING:  I have independently reviewed the scans and discussed with the patient.   I have reviewed Venita Lick LPN's note and agree with the documentation.  I personally performed a face-to-face visit, made revisions and my assessment and plan is as follows.    ASSESSMENT & PLAN:   Prostate carcinoma ([place]) 1.  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 - Abiraterone with prednisone from 12/02/2017 through January 2020 with progression.  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  No BRCA related mutations noted. -Last [name] 22.5 mg was on 12/14/2018. -5 cycles of cabazitaxel from 09/27/2018 through 01/18/2019.  He is taking prednisone 10 mg daily. -PSA on 12/21/2018 was elevated at 25. -Bone scan and CT AP on 12/16/2018 showed stable findings with no new areas. - We have reviewed his blood work.  He is tolerating chemotherapy very well.  He may proceed with his next cycle today.  I plan to repeat scans after next cycle.  2.  Low back pain: - He will continue fentanyl 75 mcg patch. -He is requiring between 1-6 tablets of hydrocodone for breakthrough pain daily.  3.  Bone mets: -he developed ulcers in the right lower jaw alveolar ridge for the last few weeks. -We will continue to hold his  denosumab today.  4.  Family history: - Maternal grandmother had stomach cancer.  Sister had stomach cancer.  Brother had some type of cancer. -He underwent genetic testing on 08/14/2018.  Germline mutation testing was negative.  Total time spent is 25 minutes with more than 50% of the time spent face-to-face discussing scan results, treatment plan and coordination of care.    Orders placed this encounter:  Orders Placed This Encounter  Procedures  . CBC with Differential/Platelet  . Comprehensive metabolic panel  . PSA      Derek Jack, MD [facility] [phone]

## 2019-02-15 NOTE — Patient Instructions (Signed)
[facility] at [facility] Discharge Instructions  Labs drawn from portacath today   Thank you for choosing [facility] at [facility] to provide your oncology and hematology care.  To afford each patient quality time with our provider, please arrive at least 15 minutes before your scheduled appointment time.   If you have a lab appointment with the Cancer Center please come in thru the  Main Entrance and check in at the main information desk  You need to re-schedule your appointment should you arrive 10 or more minutes late.  We strive to give you quality time with our providers, and arriving late affects you and other patients whose appointments are after yours.  Also, if you no show three or more times for appointments you may be dismissed from the clinic at the providers discretion.     Again, thank you for choosing [facility].  Our hope is that these requests will decrease the amount of time that you wait before being seen by our physicians.       _____________________________________________________________  Should you have questions after your visit to [facility], please contact our office at [phone] between the hours of 8:00 a.m. and 4:30 p.m.  Voicemails left after 4:00 p.m. will not be returned until the following business day.  For prescription refill requests, have your pharmacy contact our office and allow 72 hours.    Cancer Center Support Programs:   > Cancer Support Group  2nd Tuesday of the month 1pm-2pm, Journey Room   

## 2019-03-08 ENCOUNTER — Ambulatory Visit (HOSPITAL_COMMUNITY): Payer: Medicare Other

## 2019-03-15 ENCOUNTER — Inpatient Hospital Stay (HOSPITAL_COMMUNITY): Payer: Medicare Other | Attending: Hematology

## 2019-03-15 ENCOUNTER — Other Ambulatory Visit: Payer: Self-pay

## 2019-03-15 ENCOUNTER — Encounter (HOSPITAL_COMMUNITY): Payer: Self-pay | Admitting: Hematology

## 2019-03-15 ENCOUNTER — Inpatient Hospital Stay (HOSPITAL_BASED_OUTPATIENT_CLINIC_OR_DEPARTMENT_OTHER): Payer: Medicare Other | Admitting: Hematology

## 2019-03-15 ENCOUNTER — Inpatient Hospital Stay (HOSPITAL_COMMUNITY): Payer: Medicare Other

## 2019-03-15 VITALS — BP 116/77 | HR 70 | Temp 97.6°F | Resp 18

## 2019-03-15 DIAGNOSIS — C7951 Secondary malignant neoplasm of bone: Secondary | ICD-10-CM | POA: Diagnosis not present

## 2019-03-15 DIAGNOSIS — Z5189 Encounter for other specified aftercare: Secondary | ICD-10-CM | POA: Diagnosis not present

## 2019-03-15 DIAGNOSIS — C61 Malignant neoplasm of prostate: Secondary | ICD-10-CM

## 2019-03-15 DIAGNOSIS — Z5111 Encounter for antineoplastic chemotherapy: Secondary | ICD-10-CM | POA: Insufficient documentation

## 2019-03-15 DIAGNOSIS — G893 Neoplasm related pain (acute) (chronic): Secondary | ICD-10-CM | POA: Diagnosis not present

## 2019-03-15 LAB — CBC WITH DIFFERENTIAL/PLATELET
Abs Immature Granulocytes: 0.02 10*3/uL (ref 0.00–0.07)
Basophils Absolute: 0 10*3/uL (ref 0.0–0.1)
Basophils Relative: 1 %
Eosinophils Absolute: 0.1 10*3/uL (ref 0.0–0.5)
Eosinophils Relative: 2 %
HCT: 39.7 % (ref 39.0–52.0)
Hemoglobin: 12.5 g/dL — ABNORMAL LOW (ref 13.0–17.0)
Immature Granulocytes: 0 %
Lymphocytes Relative: 13 %
Lymphs Abs: 0.9 10*3/uL (ref 0.7–4.0)
MCH: 29.8 pg (ref 26.0–34.0)
MCHC: 31.5 g/dL (ref 30.0–36.0)
MCV: 94.7 fL (ref 80.0–100.0)
Monocytes Absolute: 0.5 10*3/uL (ref 0.1–1.0)
Monocytes Relative: 8 %
Neutro Abs: 5.2 10*3/uL (ref 1.7–7.7)
Neutrophils Relative %: 76 %
Platelets: 206 10*3/uL (ref 150–400)
RBC: 4.19 MIL/uL — ABNORMAL LOW (ref 4.22–5.81)
RDW: 15.1 % (ref 11.5–15.5)
WBC: 6.8 10*3/uL (ref 4.0–10.5)
nRBC: 0 % (ref 0.0–0.2)

## 2019-03-15 LAB — COMPREHENSIVE METABOLIC PANEL
ALT: 18 U/L (ref 0–44)
AST: 11 U/L — ABNORMAL LOW (ref 15–41)
Albumin: 3.5 g/dL (ref 3.5–5.0)
Alkaline Phosphatase: 54 U/L (ref 38–126)
Anion gap: 7 (ref 5–15)
BUN: 11 mg/dL (ref 8–23)
CO2: 25 mmol/L (ref 22–32)
Calcium: 8.9 mg/dL (ref 8.9–10.3)
Chloride: 109 mmol/L (ref 98–111)
Creatinine, Ser: 0.96 mg/dL (ref 0.61–1.24)
GFR calc Af Amer: >60 mL/min (ref >60)
GFR calc non Af Amer: >60 mL/min (ref >60)
Glucose, Bld: 101 mg/dL — ABNORMAL HIGH (ref 70–99)
Potassium: 3.7 mmol/L (ref 3.5–5.1)
Sodium: 141 mmol/L (ref 135–145)
Total Bilirubin: 0.6 mg/dL (ref 0.3–1.2)
Total Protein: 6.6 g/dL (ref 6.5–8.1)

## 2019-03-15 LAB — PSA: Prostatic Specific Antigen: 48.97 ng/mL — ABNORMAL HIGH (ref 0.00–4.00)

## 2019-03-15 MED ORDER — FAMOTIDINE IN NACL 20-0.9 MG/50ML-% IV SOLN
20.0000 mg | Freq: Once | INTRAVENOUS | Status: AC
  Administered 2019-03-15: 20 mg via INTRAVENOUS

## 2019-03-15 MED ORDER — FAMOTIDINE IN NACL 20-0.9 MG/50ML-% IV SOLN
INTRAVENOUS | Status: AC
  Filled 2019-03-15: qty 50

## 2019-03-15 MED ORDER — HEPARIN SOD (PORK) LOCK FLUSH 100 UNIT/ML IV SOLN
500.0000 [IU] | Freq: Once | INTRAVENOUS | Status: AC | PRN
  Administered 2019-03-15: 500 [IU]

## 2019-03-15 MED ORDER — HYDROCODONE-ACETAMINOPHEN 10-325 MG PO TABS: 1.0000 | ORAL_TABLET | ORAL | 0 refills | Status: DC | PRN

## 2019-03-15 MED ORDER — SODIUM CHLORIDE 0.9 % IV SOLN
20.0000 mg/m2 | Freq: Once | INTRAVENOUS | Status: AC
  Administered 2019-03-15: 46 mg via INTRAVENOUS
  Filled 2019-03-15: qty 4.6

## 2019-03-15 MED ORDER — DIPHENHYDRAMINE HCL 50 MG/ML IJ SOLN
INTRAMUSCULAR | Status: AC
  Filled 2019-03-15: qty 1

## 2019-03-15 MED ORDER — SODIUM CHLORIDE 0.9 % IV SOLN
10.0000 mg | Freq: Once | INTRAVENOUS | Status: AC
  Administered 2019-03-15: 10 mg via INTRAVENOUS
  Filled 2019-03-15: qty 10

## 2019-03-15 MED ORDER — DIPHENHYDRAMINE HCL 50 MG/ML IJ SOLN
25.0000 mg | Freq: Once | INTRAMUSCULAR | Status: AC
  Administered 2019-03-15: 25 mg via INTRAVENOUS

## 2019-03-15 MED ORDER — PALONOSETRON HCL INJECTION 0.25 MG/5ML
0.2500 mg | Freq: Once | INTRAVENOUS | Status: AC
  Administered 2019-03-15: 0.25 mg via INTRAVENOUS

## 2019-03-15 MED ORDER — SODIUM CHLORIDE 0.9% FLUSH
10.0000 mL | INTRAVENOUS | Status: DC | PRN
  Administered 2019-03-15: 10 mL
  Filled 2019-03-15: qty 10

## 2019-03-15 MED ORDER — FENTANYL 75 MCG/HR TD PT72: 1.0000 | MEDICATED_PATCH | TRANSDERMAL | 0 refills | Status: DC

## 2019-03-15 MED ORDER — SODIUM CHLORIDE 0.9 % IV SOLN
Freq: Once | INTRAVENOUS | Status: AC
  Administered 2019-03-15: 11:00:00 via INTRAVENOUS

## 2019-03-15 MED ORDER — PALONOSETRON HCL INJECTION 0.25 MG/5ML
INTRAVENOUS | Status: AC
  Filled 2019-03-15: qty 5

## 2019-03-15 MED ORDER — PEGFILGRASTIM 6 MG/0.6ML ~~LOC~~ PSKT
6.0000 mg | PREFILLED_SYRINGE | Freq: Once | SUBCUTANEOUS | Status: AC
  Administered 2019-03-15: 6 mg via SUBCUTANEOUS
  Filled 2019-03-15: qty 0.6

## 2019-03-15 MED ORDER — LEUPROLIDE ACETATE (3 MONTH) 22.5 MG IM KIT
22.5000 mg | PACK | Freq: Once | INTRAMUSCULAR | Status: AC
  Administered 2019-03-15: 22.5 mg via INTRAMUSCULAR
  Filled 2019-03-15: qty 22.5

## 2019-03-15 NOTE — Patient Instructions (Signed)
[facility] Discharge Instructions for Patients Receiving Chemotherapy  Today you received the following chemotherapy agents   To help prevent nausea and vomiting after your treatment, we encourage you to take your nausea medication   If you develop nausea and vomiting that is not controlled by your nausea medication, call the clinic.   BELOW ARE SYMPTOMS THAT SHOULD BE REPORTED IMMEDIATELY:  *FEVER GREATER THAN 100.5 F  *CHILLS WITH OR WITHOUT FEVER  NAUSEA AND VOMITING THAT IS NOT CONTROLLED WITH YOUR NAUSEA MEDICATION  *UNUSUAL SHORTNESS OF BREATH  *UNUSUAL BRUISING OR BLEEDING  TENDERNESS IN MOUTH AND THROAT WITH OR WITHOUT PRESENCE OF ULCERS  *URINARY PROBLEMS  *BOWEL PROBLEMS  UNUSUAL RASH Items with * indicate a potential emergency and should be followed up as soon as possible.  Feel free to call the clinic should you have any questions or concerns. The clinic phone number is [phone].  Please show the CHEMO ALERT CARD at check-in to the Emergency Department and triage nurse.   

## 2019-03-15 NOTE — Progress Notes (Signed)
Labs reviewed with MD today. Proceed with treatment per MD.   Treatment given per orders. Patient tolerated it well without problems. Vitals stable and discharged home from clinic ambulatory. Follow up as scheduled.  

## 2019-03-15 NOTE — Assessment & Plan Note (Signed)
1.  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 - Abiraterone with prednisone from 12/02/2017 through January 2020 with progression. -Germline mutation testing showed heterozygosity for MUTYH.  No black related mutations detected. - [name] 22.5 mg on 12/14/2018. - 6 cycles of cabazitaxel from 09/27/2018 through 02/15/2019.  He is taking prednisone 10 mg daily. - Bone scan and CT AP on 12/16/1998 3:20 cycles showed stable findings with no new areas. - PSA increased to 36.9 on 02/15/2019 from 25 on 12/21/2018. - He is tolerating cabazitaxel very well.  He will proceed with cycle 7 today.  We will plan to repeat bone scan and CT scan prior to next visit in 4 weeks.  2.  Upper back pain: - He will continue fentanyl 75 mcg patch.  He is also taking hydrocodone 10 mg anywhere between 1-5 tablets daily for breakthrough pain as needed.  3.  Bone metastasis: -He developed ulcers in the right lower jaw alveolar ridge.  We are holding denosumab at this time.  Last dose was on 12/21/2018.  4.  Family history: - Maternal grandmother had stomach cancer.  Sister had stomach cancer.  Brother had some type of cancer. -Germline mutation testing on 08/14/2018 was negative.

## 2019-03-15 NOTE — Patient Instructions (Addendum)
[facility] at [facility] Discharge Instructions  You were seen today by Dr. Katragadda. He went over your recent lab results. He will see you back in 4 weeks for labs and follow up.   Thank you for choosing [facility] at [facility] to provide your oncology and hematology care.  To afford each patient quality time with our provider, please arrive at least 15 minutes before your scheduled appointment time.   If you have a lab appointment with the Cancer Center please come in thru the  Main Entrance and check in at the main information desk  You need to re-schedule your appointment should you arrive 10 or more minutes late.  We strive to give you quality time with our providers, and arriving late affects you and other patients whose appointments are after yours.  Also, if you no show three or more times for appointments you may be dismissed from the clinic at the providers discretion.     Again, thank you for choosing [facility].  Our hope is that these requests will decrease the amount of time that you wait before being seen by our physicians.       _____________________________________________________________  Should you have questions after your visit to [facility], please contact our office at [phone] between the hours of 8:00 a.m. and 4:30 p.m.  Voicemails left after 4:00 p.m. will not be returned until the following business day.  For prescription refill requests, have your pharmacy contact our office and allow 72 hours.    Cancer Center Support Programs:   > Cancer Support Group  2nd Tuesday of the month 1pm-2pm, Journey Room    

## 2019-03-15 NOTE — Progress Notes (Signed)
Matthew [name], [name] [address]   CLINIC:  Medical Oncology/Hematology  PCP:  [name], [place] [address] [phone]   REASON FOR VISIT:  Follow-up for prostate cancer   BRIEF ONCOLOGIC HISTORY:  Oncology History  Prostate carcinoma ([place])  12/10/2015 Imaging   CTA chest, multiple thoracic and rib osseous lesions. no primary evident. Atherosclerosis including aortic and CAD   12/12/2015 Imaging   CT abdomen/pelvis with sclerotic osseous lesions througout the lumbar spine, sacrum, bilateral iliac bones and L proximal femur worrisome for sclerotic osseous mets,mild prostatomegaly, no LAD, Infrarenal 3.8 cm AAA   12/12/2015 Tumor Marker   PSA 153.85   12/18/2015 Imaging   Bone Scan Diffuse bony metastatic disease, posterior calvarium, sternum, thoracic, lumbar spine, bilateral ribs, L shoulder, bilateral bony pelvis, proximal L femur   12/24/2015 Initial Biopsy   CT biopsy of L iliac bone lesion   12/26/2015 Pathology Results   Bone, biopsy, left iliac - POSITIVE FOR METASTATIC ADENOCARCINOMA.   12/28/2015 - 09/01/2016 Chemotherapy   Firmagon instituted 240 mg    01/01/2016 Procedure   Port placed by IR.   01/07/2016 - 04/11/2016 Chemotherapy   Docetaxel every 21 days x 6 cycles with dose reductions due to tolerance   02/18/2016 Adverse Reaction   GI toxicity from docetaxel, dose reduced to 60 mg/m2   02/18/2016 Treatment Plan Change   Docetaxel dose reduced by 20%   03/06/2016 Procedure   Dr. Enrique Sack- Multiple extraction of tooth numbers 3, 5, 6, 8, 9, 10, 11, 12, 20, 21, 22, 27, 28, and 31. 4 Quadrants of alveoloplasty. Bilateral mandibular lingual tori reductions.   04/09/2016 Imaging   Brain MRI 1. No intracranial metastatic disease 2. No acute intracranial abnormality 3. Findings of chronic microvascular disease   05/14/2016 Imaging   CT C/A/P Stable appearing diffuse bony metastatic disease. No new  significant disease is seen Innumerable patchy sclerotic osseous metastases throughout the axial and proximal appendicular skeleton, stable in size and distribution, significantly increased in density in the interval, likely reflecting treatment effect. 2. No new or progressive metastatic disease in the chest, abdomen or pelvis. 3. Bilateral perifissural pulmonary nodules are stable and probably benign. 4. Aortic atherosclerosis. Infrarenal 4.3 cm abdominal aortic aneurysm, minimally increased in size. Recommend followup by ultrasound in 1 year.   06/02/2016 Tumor Marker   PSA 0.99 ng/ml    - 07/31/2016 Radiation Therapy   Palliative XRT by Dr. Lianne Cure in [place].    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  The Multi-Gene Panel offered by [facility] includes sequencing and/or deletion duplication testing of the following 85 genes: AIP, ALK, APC, ATM, AXIN2,BAP1,  BARD1, BLM, BMPR1A, BRCA1, BRCA2, BRIP1, CASR, CDC73, CDH1, CDK4, CDKN1B, CDKN1C, CDKN2A (p14ARF), CDKN2A (p16INK4a), CEBPA, CHEK2, CTNNA1, DICER1, DIS3L2, EGFR (c.2369C>T, p.Thr790Met variant only), EPCAM (Deletion/duplication testing only), FH, FLCN, GATA2, GPC3, GREM1 (Promoter region deletion/duplication testing only), HOXB13 (c.251G>A, p.Gly84Glu), HRAS, KIT, MAX, MEN1, MET, MITF (c.952G>A, p.Glu318Lys variant only), MLH1, MSH2, MSH3, MSH6, MUTYH, NBN, NF1, NF2, NTHL1, PALB2, PDGFRA, PHOX2B, PMS2, POLD1, POLE, POT1, PRKAR1A, PTCH1, PTEN, RAD50, RAD51C, RAD51D, RB1, RECQL4, RET, RNF43, RUNX1, SDHAF2, SDHA (sequence changes only), SDHB, SDHC, SDHD, SMAD4, SMARCA4, SMARCB1, SMARCE1, STK11, SUFU, TERC, TERT, TMEM127, TP53, TSC1, TSC2, VHL, WRN and WT1.  The report date is August 24, 2018  This  is not thought to increase the risk for colon cancer or other cancers, unless there is the presence of two  pathogenic mutations.   09/27/2018 -  Chemotherapy   The patient had palonosetron (ALOXI) injection 0.25 mg, 0.25 mg, Intravenous,  Once, 7 of 10 cycles Administration: 0.25 mg (09/27/2018), 0.25 mg (10/25/2018), 0.25 mg (11/23/2018), 0.25 mg (12/21/2018), 0.25 mg (01/18/2019), 0.25 mg (02/15/2019) pegfilgrastim (NEULASTA ONPRO KIT) injection 6 mg, 6 mg, Subcutaneous, Once, 6 of 9 cycles Administration: 6 mg (10/25/2018), 6 mg (11/23/2018), 6 mg (12/21/2018), 6 mg (01/18/2019), 6 mg (02/15/2019) pegfilgrastim-cbqv (UDENYCA) injection 6 mg, 6 mg, Subcutaneous, Once, 1 of 1 cycle Administration: 6 mg (09/29/2018) cabazitaxel (JEVTANA) 35 mg in dextrose 5 % 250 mL chemo infusion, 15 mg/m2 = 35 mg (100 % of original dose 15 mg/m2), Intravenous,  Once, 7 of 10 cycles Dose modification: 15 mg/m2 (original dose 15 mg/m2, Cycle 1, Reason: Provider Judgment), 20 mg/m2 (original dose 15 mg/m2, Cycle 2, Reason: Provider Judgment), 20 mg/m2 (original dose 20 mg/m2, Cycle 5, Reason: Patient Age, Comment: Change in jevtana orderable) Administration: 35 mg (09/27/2018), 46 mg (10/25/2018), 46 mg (11/23/2018), 46 mg (12/21/2018), 46 mg (01/18/2019), 46 mg (02/15/2019)  for chemotherapy treatment.    Bone metastases ([place])  12/26/2015 Initial Diagnosis   Bone metastases ([place])   09/27/2018 -  Chemotherapy   The patient had palonosetron (ALOXI) injection 0.25 mg, 0.25 mg, Intravenous,  Once, 4 of 6 cycles Administration: 0.25 mg (09/27/2018), 0.25 mg (10/25/2018), 0.25 mg (11/23/2018), 0.25 mg (12/21/2018) pegfilgrastim (NEULASTA ONPRO KIT) injection 6 mg, 6 mg, Subcutaneous, Once, 3 of 5 cycles Administration: 6 mg (10/25/2018), 6 mg (11/23/2018), 6 mg (12/21/2018) pegfilgrastim-cbqv (UDENYCA) injection 6 mg, 6 mg, Subcutaneous, Once, 1 of 1 cycle Administration: 6 mg (09/29/2018) cabazitaxel (JEVTANA) 35 mg in dextrose 5 % 250 mL chemo infusion, 15 mg/m2 = 35 mg (100 % of original dose 15 mg/m2), Intravenous,  Once, 4 of 6 cycles Dose  modification: 15 mg/m2 (original dose 15 mg/m2, Cycle 1, Reason: Provider Judgment), 20 mg/m2 (original dose 15 mg/m2, Cycle 2, Reason: Provider Judgment) Administration: 35 mg (09/27/2018), 46 mg (10/25/2018), 46 mg (11/23/2018), 46 mg (12/21/2018)  for chemotherapy treatment.       CANCER STAGING: Cancer Staging Prostate carcinoma [facility]) Staging form: Prostate, AJCC 7th Edition - Clinical stage from 12/24/2015: Stage IV (TX, N0, M1b, PSA: 20 or greater) - Signed by Baird Cancer, PA-C on 01/07/2016    INTERVAL HISTORY:  Mr. Hinks 63 y.o. male seen prior to cycle 7 of chemotherapy.  Cycle 6 was on 02/15/2019.  He denied any nausea, vomiting, diarrhea or constipation after last cycle.  Appetite was 75%.  Energy levels are 50%.  Upper back pain is well controlled on the current regimen.  He is taking hydrocodone 10 mg anywhere between 1-5 tablets/day.  He ran out of pain pills on Friday.  He is requesting refill.  He also uses fentanyl 75 mcg patch.  Denies any new onset pains.  He is able to do all his household activities.      REVIEW OF SYSTEMS:  Review of Systems  Musculoskeletal: Positive for back pain.  All other systems reviewed and are negative.    PAST MEDICAL/SURGICAL HISTORY:  Past Medical History:  Diagnosis Date  . Arthritis    Left knee  . Bone cancer ([place])   . Bone metastases ([place]) 12/26/2015  . Cancer [facility])    Prostate  . DVT (deep venous thrombosis) ([place]) 2004  .  Family history of prostate cancer   . Family history of stomach cancer   . Kidney stones    ~2002  . Prostate carcinoma ([place]) 12/26/2015   Past Surgical History:  Procedure Laterality Date  . CIRCUMCISION  30 years ago  . MULTIPLE EXTRACTIONS WITH ALVEOLOPLASTY N/A 03/06/2016   Procedure: Extraction of tooth #'s 3,5,6,8-12,20-22, 27, 28, and 31 with alveoloplasty and bilateral mandibular tori reductions;  Surgeon: Lenn Cal, DDS;  Location: [facility];  Service: Oral Surgery;  Laterality: N/A;      SOCIAL HISTORY:  Social History   Socioeconomic History  . Marital status: Single    Spouse name: Not on file  . Number of children: 2  . Years of education: Not on file  . Highest education level: Not on file  Occupational History  . Occupation: Research scientist (life sciences)  Social Needs  . Financial resource strain: Not on file  . Food insecurity    Worry: Not on file    Inability: Not on file  . Transportation needs    Medical: Not on file    Non-medical: Not on file  Tobacco Use  . Smoking status: Current Every Day Smoker    Packs/day: 1.00    Years: 50.00    Pack years: 50.00    Types: Cigarettes    Start date: 06/16/1965  . Smokeless tobacco: Former Systems developer    Types: [place] date: 08/04/2005  Substance and Sexual Activity  . Alcohol use: No    Alcohol/week: 0.0 standard drinks  . Drug use: Yes    Types: Marijuana    Comment: THC for appetite; occ  . Sexual activity: Not on file  Lifestyle  . Physical activity    Days per week: Not on file    Minutes per session: Not on file  . Stress: Not on file  Relationships  . Social Herbalist on phone: Not on file    Gets together: Not on file    Attends religious service: Not on file    Active member of club or organization: Not on file    Attends meetings of clubs or organizations: Not on file    Relationship status: Not on file  . Intimate partner violence    Fear of current or ex partner: Not on file    Emotionally abused: Not on file    Physically abused: Not on file    Forced sexual activity: Not on file  Other Topics Concern  . Not on file  Social History Narrative  . Not on file    FAMILY HISTORY:  Family History  Problem Relation Age of Onset  . Stomach cancer Mother        dx 92s  . Aneurysm Maternal Aunt        brain  . Alcohol abuse Maternal Uncle   . Stomach cancer Maternal Grandmother        dx 50s-60s  . Prostate cancer Maternal Uncle        dx 34s    CURRENT MEDICATIONS:   Outpatient Encounter Medications as of 03/15/2019  Medication Sig  . abiraterone acetate (ZYTIGA) 250 MG tablet Take 4 tablets (1,000 mg total) by mouth daily. Take on an empty stomach 1 hour before or 2 hours after a meal  . Calcium Carb-Cholecalciferol (CALCIUM 500 + D3) 500-600 MG-UNIT TABS Take 2 tablets by mouth daily.  . chlorhexidine (PERIDEX) 0.12 % solution Rinse with 15 mls twice daily for 30 seconds.  Spit out excess. Do not swallow.  . ergocalciferol (VITAMIN D2) 50000 units capsule Take 1 capsule (50,000 Units total) by mouth every Friday.  . fentaNYL (DURAGESIC) 75 MCG/HR Place 1 patch onto the skin every 3 (three) days.  Marland Kitchen HYDROcodone-acetaminophen (NORCO) 10-325 MG tablet Take 1 tablet by mouth every 4 (four) hours as needed.  . lidocaine-prilocaine (EMLA) cream Apply a quarter size amount to port site 1 hour prior to chemo. Do not rub in. Cover with plastic wrap.  . lubiprostone (AMITIZA) 24 MCG capsule Take 1 capsule (24 mcg total) by mouth 2 (two) times daily with a meal.  . omeprazole (PRILOSEC) 20 MG capsule Take 1 capsule by mouth once daily  . ondansetron (ZOFRAN) 8 MG tablet TAKE ONE TABLET BY MOUTH EVERY 8 HOURS AS NEEDED FOR NAUSEA OR VOMITING  . predniSONE (DELTASONE) 10 MG tablet Take 1 tablet (10 mg total) by mouth daily.  . prochlorperazine (COMPAZINE) 10 MG tablet Take 1 tablet (10 mg total) by mouth every 6 (six) hours as needed for nausea or vomiting.  . tamsulosin (FLOMAX) 0.4 MG CAPS capsule Take 1 capsule (0.4 mg total) by mouth at bedtime.  . temazepam (RESTORIL) 15 MG capsule Take 1 capsule (15 mg total) by mouth at bedtime as needed for sleep.  . Wheat Dextrin (BENEFIBER PO) Take by mouth as needed.  . [DISCONTINUED] fentaNYL (DURAGESIC) 75 MCG/HR Place 1 patch onto the skin every 3 (three) days.  . [DISCONTINUED] HYDROcodone-acetaminophen (NORCO) 10-325 MG tablet Take 1 tablet by mouth every 4 (four) hours as needed.   Facility-Administered Encounter  Medications as of 03/15/2019  Medication  . sodium chloride flush (NS) 0.9 % injection 10 mL    ALLERGIES:  Allergies  Allergen Reactions  . Meloxicam Other (See Comments)    Caused stomach bleeding.     PHYSICAL EXAM:  ECOG Performance status: 1  Vitals:   03/15/19 0900  BP: 130/84  Pulse: 92  Resp: 16  Temp: 97.7 F (36.5 C)  SpO2: 94%   Filed Weights   03/15/19 0900  Weight: 234 lb 7 oz (106.3 kg)    Physical Exam Vitals signs reviewed.  Constitutional:      Appearance: Normal appearance.  Cardiovascular:     Rate and Rhythm: Normal rate and regular rhythm.     Heart sounds: Normal heart sounds.  Pulmonary:     Effort: Pulmonary effort is normal.     Breath sounds: Normal breath sounds.  Abdominal:     General: There is no distension.     Palpations: Abdomen is soft. There is no mass.  Musculoskeletal:        General: No swelling.  Skin:    General: Skin is warm.  Neurological:     General: No focal deficit present.     Mental Status: He is alert and oriented to person, place, and time.  Psychiatric:        Mood and Affect: Mood normal.        Behavior: Behavior normal.    There is an ulcer on the right alveolar ridge in the lower jaw.  LABORATORY DATA:  I have reviewed the labs as listed.  CBC    Component Value Date/Time   WBC 6.8 03/15/2019 0900   RBC 4.19 (L) 03/15/2019 0900   HGB 12.5 (L) 03/15/2019 0900   HCT 39.7 03/15/2019 0900   PLT 206 03/15/2019 0900   MCV 94.7 03/15/2019 0900   MCH 29.8 03/15/2019 0900   MCHC  31.5 03/15/2019 0900   RDW 15.1 03/15/2019 0900   LYMPHSABS 0.9 03/15/2019 0900   MONOABS 0.5 03/15/2019 0900   EOSABS 0.1 03/15/2019 0900   BASOSABS 0.0 03/15/2019 0900   CMP Latest Ref Rng & Units 03/15/2019 02/15/2019 01/18/2019  Glucose 70 - 99 mg/dL 101(H) 110(H) 128(H)  BUN 8 - 23 mg/dL '11 11 13  '$ Creatinine 0.61 - 1.24 mg/dL 0.96 1.13 0.97  Sodium 135 - 145 mmol/L 141 140 143  Potassium 3.5 - 5.1 mmol/L 3.7 3.7 3.6   Chloride 98 - 111 mmol/L 109 107 107  CO2 22 - 32 mmol/L '25 25 24  '$ Calcium 8.9 - 10.3 mg/dL 8.9 8.6(L) 8.9  Total Protein 6.5 - 8.1 g/dL 6.6 6.1(L) 6.2(L)  Total Bilirubin 0.3 - 1.2 mg/dL 0.6 0.5 0.2(L)  Alkaline Phos 38 - 126 U/L 54 56 63  AST 15 - 41 U/L 11(L) 13(L) 11(L)  ALT 0 - 44 U/L '18 13 12       '$ DIAGNOSTIC IMAGING:  I have independently reviewed the scans and discussed with the patient.   I have reviewed Venita Lick LPN's note and agree with the documentation.  I personally performed a face-to-face visit, made revisions and my assessment and plan is as follows.    ASSESSMENT & PLAN:   Prostate carcinoma ([place]) 1.  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 - Abiraterone with prednisone from 12/02/2017 through January 2020 with progression. -Germline mutation testing showed heterozygosity for MUTYH.  No black related mutations detected. - [name] 22.5 mg on 12/14/2018. - 6 cycles of cabazitaxel from 09/27/2018 through 02/15/2019.  He is taking prednisone 10 mg daily. - Bone scan and CT AP on 12/16/1998 3:20 cycles showed stable findings with no new areas. - PSA increased to 36.9 on 02/15/2019 from 25 on 12/21/2018. - He is tolerating cabazitaxel very well.  He will proceed with cycle 7 today.  We will plan to repeat bone scan and CT scan prior to next visit in 4 weeks.  2.  Upper back pain: - He will continue fentanyl 75 mcg patch.  He is also taking hydrocodone 10 mg anywhere between 1-5 tablets daily for breakthrough pain as needed.  3.  Bone metastasis: -He developed ulcers in the right lower jaw alveolar ridge.  We are holding denosumab at this time.  Last dose was on 12/21/2018.  4.  Family history: - Maternal grandmother had stomach cancer.  Sister had stomach cancer.  Brother had some type of cancer. -Germline mutation testing on 08/14/2018 was negative.  Total  time spent is 25 minutes with more than 50% of the time spent face-to-face discussing treatment plan, counseling and coordination of care.    Orders placed this encounter:  Orders Placed This Encounter  Procedures  . CT Abdomen Pelvis W Contrast  . NM Bone Scan Whole Body      Derek Jack, MD [place] [phone]

## 2019-03-16 ENCOUNTER — Inpatient Hospital Stay (HOSPITAL_COMMUNITY): Payer: Medicare Other

## 2019-03-16 DIAGNOSIS — Z5111 Encounter for antineoplastic chemotherapy: Secondary | ICD-10-CM | POA: Diagnosis not present

## 2019-03-16 DIAGNOSIS — C7951 Secondary malignant neoplasm of bone: Secondary | ICD-10-CM | POA: Diagnosis not present

## 2019-03-16 DIAGNOSIS — Z5189 Encounter for other specified aftercare: Secondary | ICD-10-CM | POA: Diagnosis not present

## 2019-03-16 DIAGNOSIS — C61 Malignant neoplasm of prostate: Secondary | ICD-10-CM | POA: Diagnosis not present

## 2019-03-16 MED ORDER — PEGFILGRASTIM 6 MG/0.6ML ~~LOC~~ PSKT
PREFILLED_SYRINGE | SUBCUTANEOUS | Status: AC
  Filled 2019-03-16: qty 0.6

## 2019-03-16 MED ORDER — PEGFILGRASTIM 6 MG/0.6ML ~~LOC~~ PSKT
6.0000 mg | PREFILLED_SYRINGE | Freq: Once | SUBCUTANEOUS | Status: AC
  Administered 2019-03-16: 09:00:00 6 mg via SUBCUTANEOUS

## 2019-03-16 NOTE — Progress Notes (Signed)
03/16/19  Notice from RN patient reported OnPro fell off when he bumped it.  Patient does not like the injection of Neulasta as it makes him feel ill verses the OnPro.   Pharmacy will dispense an additional OnPro to be applied today.  Barnetta Chapel Page RN in agreement and defective OnPro returned to pharmacy for drug replacement by manufacturer.  Henreitta Leber, PharmD

## 2019-03-16 NOTE — Progress Notes (Signed)
Patient came into clinic this morning reported Onpro device had fallen off his arm. Patient reported that the shot makes him "really sick". Spoke with pharmacist and will just do another neulasta onpro. Old device put in bag and given to Henreitta Leber PharmD.    Marland KitchenLeonette Most arrived today for [facility] neulasta on body injector. See MAR for administration details. Injector in place and engaged with green light indicator on flashing. Tolerated application with out problems.  Discharged home from clinic ambulatory. Follow up as scheduled.

## 2019-04-05 ENCOUNTER — Ambulatory Visit (HOSPITAL_COMMUNITY)
Admission: RE | Admit: 2019-04-05 | Discharge: 2019-04-05 | Disposition: A | Discharge status: Home / Self Care | Payer: Medicare Other | Source: Ambulatory Visit | Attending: Hematology | Admitting: Hematology

## 2019-04-05 ENCOUNTER — Other Ambulatory Visit: Payer: Self-pay

## 2019-04-05 DIAGNOSIS — C7951 Secondary malignant neoplasm of bone: Secondary | ICD-10-CM

## 2019-04-05 DIAGNOSIS — C61 Malignant neoplasm of prostate: Secondary | ICD-10-CM | POA: Diagnosis not present

## 2019-04-05 MED ORDER — TECHNETIUM TC 99M MEDRONATE IV KIT
20.0000 | PACK | Freq: Once | INTRAVENOUS | Status: AC | PRN
  Administered 2019-04-05: 20 via INTRAVENOUS

## 2019-04-08 ENCOUNTER — Ambulatory Visit (HOSPITAL_COMMUNITY)
Admission: RE | Admit: 2019-04-08 | Discharge: 2019-04-08 | Disposition: A | Discharge status: Home / Self Care | Payer: Medicare Other | Source: Ambulatory Visit | Attending: Hematology | Admitting: Hematology

## 2019-04-08 ENCOUNTER — Other Ambulatory Visit: Payer: Self-pay

## 2019-04-08 DIAGNOSIS — C61 Malignant neoplasm of prostate: Secondary | ICD-10-CM | POA: Insufficient documentation

## 2019-04-08 DIAGNOSIS — C7951 Secondary malignant neoplasm of bone: Secondary | ICD-10-CM | POA: Diagnosis not present

## 2019-04-08 MED ORDER — IOHEXOL 300 MG/ML  SOLN
100.0000 mL | Freq: Once | INTRAMUSCULAR | Status: AC | PRN
  Administered 2019-04-08: 11:00:00 100 mL via INTRAVENOUS

## 2019-04-12 ENCOUNTER — Encounter (HOSPITAL_COMMUNITY): Payer: Self-pay | Admitting: Hematology

## 2019-04-12 ENCOUNTER — Other Ambulatory Visit: Payer: Self-pay

## 2019-04-12 ENCOUNTER — Inpatient Hospital Stay (HOSPITAL_COMMUNITY): Payer: Medicare Other

## 2019-04-12 ENCOUNTER — Inpatient Hospital Stay (HOSPITAL_BASED_OUTPATIENT_CLINIC_OR_DEPARTMENT_OTHER): Payer: Medicare Other | Admitting: Hematology

## 2019-04-12 ENCOUNTER — Inpatient Hospital Stay (HOSPITAL_COMMUNITY): Payer: Medicare Other | Attending: Hematology

## 2019-04-12 VITALS — BP 113/74 | HR 74 | Temp 97.6°F | Resp 18

## 2019-04-12 DIAGNOSIS — Z8042 Family history of malignant neoplasm of prostate: Secondary | ICD-10-CM

## 2019-04-12 DIAGNOSIS — C7951 Secondary malignant neoplasm of bone: Secondary | ICD-10-CM

## 2019-04-12 DIAGNOSIS — C61 Malignant neoplasm of prostate: Secondary | ICD-10-CM

## 2019-04-12 DIAGNOSIS — G893 Neoplasm related pain (acute) (chronic): Secondary | ICD-10-CM | POA: Diagnosis not present

## 2019-04-12 DIAGNOSIS — Z5111 Encounter for antineoplastic chemotherapy: Secondary | ICD-10-CM | POA: Diagnosis not present

## 2019-04-12 LAB — COMPREHENSIVE METABOLIC PANEL
ALT: 13 U/L (ref 0–44)
AST: 12 U/L — ABNORMAL LOW (ref 15–41)
Albumin: 3.6 g/dL (ref 3.5–5.0)
Alkaline Phosphatase: 67 U/L (ref 38–126)
Anion gap: 10 (ref 5–15)
BUN: 10 mg/dL (ref 8–23)
CO2: 25 mmol/L (ref 22–32)
Calcium: 8.3 mg/dL — ABNORMAL LOW (ref 8.9–10.3)
Chloride: 104 mmol/L (ref 98–111)
Creatinine, Ser: 0.97 mg/dL (ref 0.61–1.24)
GFR calc Af Amer: >60 mL/min (ref >60)
GFR calc non Af Amer: >60 mL/min (ref >60)
Glucose, Bld: 113 mg/dL — ABNORMAL HIGH (ref 70–99)
Potassium: 3.7 mmol/L (ref 3.5–5.1)
Sodium: 139 mmol/L (ref 135–145)
Total Bilirubin: 0.5 mg/dL (ref 0.3–1.2)
Total Protein: 6.4 g/dL — ABNORMAL LOW (ref 6.5–8.1)

## 2019-04-12 LAB — CBC WITH DIFFERENTIAL/PLATELET
Abs Immature Granulocytes: 0.02 10*3/uL (ref 0.00–0.07)
Basophils Absolute: 0 10*3/uL (ref 0.0–0.1)
Basophils Relative: 0 %
Eosinophils Absolute: 0.1 10*3/uL (ref 0.0–0.5)
Eosinophils Relative: 2 %
HCT: 40.9 % (ref 39.0–52.0)
Hemoglobin: 12.9 g/dL — ABNORMAL LOW (ref 13.0–17.0)
Immature Granulocytes: 0 %
Lymphocytes Relative: 14 %
Lymphs Abs: 0.8 10*3/uL (ref 0.7–4.0)
MCH: 30.4 pg (ref 26.0–34.0)
MCHC: 31.5 g/dL (ref 30.0–36.0)
MCV: 96.5 fL (ref 80.0–100.0)
Monocytes Absolute: 0.5 10*3/uL (ref 0.1–1.0)
Monocytes Relative: 8 %
Neutro Abs: 4.4 10*3/uL (ref 1.7–7.7)
Neutrophils Relative %: 76 %
Platelets: 215 10*3/uL (ref 150–400)
RBC: 4.24 MIL/uL (ref 4.22–5.81)
RDW: 15 % (ref 11.5–15.5)
WBC: 5.8 10*3/uL (ref 4.0–10.5)
nRBC: 0 % (ref 0.0–0.2)

## 2019-04-12 LAB — PSA: Prostatic Specific Antigen: 51.36 ng/mL — ABNORMAL HIGH (ref 0.00–4.00)

## 2019-04-12 MED ORDER — HEPARIN SOD (PORK) LOCK FLUSH 100 UNIT/ML IV SOLN
500.0000 [IU] | Freq: Once | INTRAVENOUS | Status: AC | PRN
  Administered 2019-04-12: 500 [IU]

## 2019-04-12 MED ORDER — SODIUM CHLORIDE 0.9 % IV SOLN
Freq: Once | INTRAVENOUS | Status: AC
  Administered 2019-04-12: 10:00:00 via INTRAVENOUS

## 2019-04-12 MED ORDER — SODIUM CHLORIDE 0.9 % IV SOLN
Freq: Once | INTRAVENOUS | Status: AC
  Administered 2019-04-12: 12:00:00 via INTRAVENOUS
  Filled 2019-04-12: qty 10

## 2019-04-12 MED ORDER — SODIUM CHLORIDE 0.9 % IV SOLN
15.0000 mg/m2 | Freq: Once | INTRAVENOUS | Status: AC
  Administered 2019-04-12: 35 mg via INTRAVENOUS
  Filled 2019-04-12: qty 3.5

## 2019-04-12 MED ORDER — PALONOSETRON HCL INJECTION 0.25 MG/5ML
0.2500 mg | Freq: Once | INTRAVENOUS | Status: AC
  Administered 2019-04-12: 0.25 mg via INTRAVENOUS
  Filled 2019-04-12: qty 5

## 2019-04-12 MED ORDER — DENOSUMAB 120 MG/1.7ML ~~LOC~~ SOLN: 120.0000 mg | Freq: Once | SUBCUTANEOUS | Status: DC

## 2019-04-12 MED ORDER — SODIUM CHLORIDE 0.9% FLUSH
10.0000 mL | INTRAVENOUS | Status: DC | PRN
  Administered 2019-04-12: 10:00:00 10 mL
  Filled 2019-04-12: qty 10

## 2019-04-12 MED ORDER — SODIUM CHLORIDE 0.9 % IV SOLN
10.0000 mg | Freq: Once | INTRAVENOUS | Status: AC
  Administered 2019-04-12: 11:00:00 10 mg via INTRAVENOUS
  Filled 2019-04-12: qty 10

## 2019-04-12 MED ORDER — PEGFILGRASTIM 6 MG/0.6ML ~~LOC~~ PSKT
6.0000 mg | PREFILLED_SYRINGE | Freq: Once | SUBCUTANEOUS | Status: AC
  Administered 2019-04-12: 6 mg via SUBCUTANEOUS
  Filled 2019-04-12: qty 0.6

## 2019-04-12 MED ORDER — FAMOTIDINE IN NACL 20-0.9 MG/50ML-% IV SOLN
20.0000 mg | Freq: Once | INTRAVENOUS | Status: AC
  Administered 2019-04-12: 12:00:00 20 mg via INTRAVENOUS
  Filled 2019-04-12: qty 50

## 2019-04-12 MED ORDER — LIDOCAINE 5 % EX PTCH: 1.0000 | MEDICATED_PATCH | CUTANEOUS | 0 refills | Status: DC

## 2019-04-12 MED ORDER — FENTANYL 75 MCG/HR TD PT72: 1.0000 | MEDICATED_PATCH | TRANSDERMAL | 0 refills | Status: DC

## 2019-04-12 MED ORDER — DIPHENHYDRAMINE HCL 50 MG/ML IJ SOLN
25.0000 mg | Freq: Once | INTRAMUSCULAR | Status: AC
  Administered 2019-04-12: 25 mg via INTRAVENOUS
  Filled 2019-04-12: qty 1

## 2019-04-12 MED ORDER — HYDROCODONE-ACETAMINOPHEN 10-325 MG PO TABS: 1.0000 | ORAL_TABLET | ORAL | 0 refills | Status: DC | PRN

## 2019-04-12 NOTE — Patient Instructions (Signed)
[facility] at [facility] Discharge Instructions  Labs drawn from portacath today   Thank you for choosing [facility] at [facility] to provide your oncology and hematology care.  To afford each patient quality time with our provider, please arrive at least 15 minutes before your scheduled appointment time.   If you have a lab appointment with the Cancer Center please come in thru the Main Entrance and check in at the main information desk.  You need to re-schedule your appointment should you arrive 10 or more minutes late.  We strive to give you quality time with our providers, and arriving late affects you and other patients whose appointments are after yours.  Also, if you no show three or more times for appointments you may be dismissed from the clinic at the providers discretion.     Again, thank you for choosing [facility].  Our hope is that these requests will decrease the amount of time that you wait before being seen by our physicians.       _____________________________________________________________  Should you have questions after your visit to  Cancer Center, please contact our office at [phone] between the hours of 8:00 a.m. and 4:30 p.m.  Voicemails left after 4:00 p.m. will not be returned until the following business day.  For prescription refill requests, have your pharmacy contact our office and allow 72 hours.    Due to Covid, you will need to wear a mask upon entering the hospital. If you do not have a mask, a mask will be given to you at the Main Entrance upon arrival. For doctor visits, patients may have 1 support person with them. For treatment visits, patients can not have anyone with them due to social distancing guidelines and our immunocompromised population.     

## 2019-04-12 NOTE — Patient Instructions (Addendum)
[facility] at [facility] Discharge Instructions  You were seen today by Dr. Katragadda. He went over your recent lab results. He will see you back in 4 weeks for labs, treatment and follow up.   Thank you for choosing [facility] at [facility] to provide your oncology and hematology care.  To afford each patient quality time with our provider, please arrive at least 15 minutes before your scheduled appointment time.   If you have a lab appointment with the Cancer Center please come in thru the  Main Entrance and check in at the main information desk  You need to re-schedule your appointment should you arrive 10 or more minutes late.  We strive to give you quality time with our providers, and arriving late affects you and other patients whose appointments are after yours.  Also, if you no show three or more times for appointments you may be dismissed from the clinic at the providers discretion.     Again, thank you for choosing [facility].  Our hope is that these requests will decrease the amount of time that you wait before being seen by our physicians.       _____________________________________________________________  Should you have questions after your visit to [facility], please contact our office at [phone] between the hours of 8:00 a.m. and 4:30 p.m.  Voicemails left after 4:00 p.m. will not be returned until the following business day.  For prescription refill requests, have your pharmacy contact our office and allow 72 hours.    Cancer Center Support Programs:   > Cancer Support Group  2nd Tuesday of the month 1pm-2pm, Journey Room    

## 2019-04-12 NOTE — Progress Notes (Signed)
Matthew [name], [name] [id number]   CLINIC:  Medical Oncology/Hematology  PCP:  [name], [place] [address] [phone]   REASON FOR VISIT:  Follow-up for prostate cancer   BRIEF ONCOLOGIC HISTORY:  Oncology History  Prostate carcinoma ([place])  12/10/2015 Imaging   CTA chest, multiple thoracic and rib osseous lesions. no primary evident. Atherosclerosis including aortic and CAD   12/12/2015 Imaging   CT abdomen/pelvis with sclerotic osseous lesions througout the lumbar spine, sacrum, bilateral iliac bones and L proximal femur worrisome for sclerotic osseous mets,mild prostatomegaly, no LAD, Infrarenal 3.8 cm AAA   12/12/2015 Tumor Marker   PSA 153.85   12/18/2015 Imaging   Bone Scan Diffuse bony metastatic disease, posterior calvarium, sternum, thoracic, lumbar spine, bilateral ribs, L shoulder, bilateral bony pelvis, proximal L femur   12/24/2015 Initial Biopsy   CT biopsy of L iliac bone lesion   12/26/2015 Pathology Results   Bone, biopsy, left iliac - POSITIVE FOR METASTATIC ADENOCARCINOMA.   12/28/2015 - 09/01/2016 Chemotherapy   Firmagon instituted 240 mg    01/01/2016 Procedure   Port placed by IR.   01/07/2016 - 04/11/2016 Chemotherapy   Docetaxel every 21 days x 6 cycles with dose reductions due to tolerance   02/18/2016 Adverse Reaction   GI toxicity from docetaxel, dose reduced to 60 mg/m2   02/18/2016 Treatment Plan Change   Docetaxel dose reduced by 20%   03/06/2016 Procedure   Dr. Enrique Sack- Multiple extraction of tooth numbers 3, 5, 6, 8, 9, 10, 11, 12, 20, 21, 22, 27, 28, and 31. 4 Quadrants of alveoloplasty. Bilateral mandibular lingual tori reductions.   04/09/2016 Imaging   Brain MRI 1. No intracranial metastatic disease 2. No acute intracranial abnormality 3. Findings of chronic microvascular disease   05/14/2016 Imaging   CT C/A/P Stable appearing diffuse bony metastatic disease. No new  significant disease is seen Innumerable patchy sclerotic osseous metastases throughout the axial and proximal appendicular skeleton, stable in size and distribution, significantly increased in density in the interval, likely reflecting treatment effect. 2. No new or progressive metastatic disease in the chest, abdomen or pelvis. 3. Bilateral perifissural pulmonary nodules are stable and probably benign. 4. Aortic atherosclerosis. Infrarenal 4.3 cm abdominal aortic aneurysm, minimally increased in size. Recommend followup by ultrasound in 1 year.   06/02/2016 Tumor Marker   PSA 0.99 ng/ml    - 07/31/2016 Radiation Therapy   Palliative XRT by Dr. Lianne Cure in [place].    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  The Multi-Gene Panel offered by [facility] includes sequencing and/or deletion duplication testing of the following 85 genes: AIP, ALK, APC, ATM, AXIN2,BAP1,  BARD1, BLM, BMPR1A, BRCA1, BRCA2, BRIP1, CASR, CDC73, CDH1, CDK4, CDKN1B, CDKN1C, CDKN2A (p14ARF), CDKN2A (p16INK4a), CEBPA, CHEK2, CTNNA1, DICER1, DIS3L2, EGFR (c.2369C>T, p.Thr790Met variant only), EPCAM (Deletion/duplication testing only), FH, FLCN, GATA2, GPC3, GREM1 (Promoter region deletion/duplication testing only), HOXB13 (c.251G>A, p.Gly84Glu), HRAS, KIT, MAX, MEN1, MET, MITF (c.952G>A, p.Glu318Lys variant only), MLH1, MSH2, MSH3, MSH6, MUTYH, NBN, NF1, NF2, NTHL1, PALB2, PDGFRA, PHOX2B, PMS2, POLD1, POLE, POT1, PRKAR1A, PTCH1, PTEN, RAD50, RAD51C, RAD51D, RB1, RECQL4, RET, RNF43, RUNX1, SDHAF2, SDHA (sequence changes only), SDHB, SDHC, SDHD, SMAD4, SMARCA4, SMARCB1, SMARCE1, STK11, SUFU, TERC, TERT, TMEM127, TP53, TSC1, TSC2, VHL, WRN and WT1.  The report date is August 24, 2018  This  is not thought to increase the risk for colon cancer or other cancers, unless there is the presence of two  pathogenic mutations.   09/27/2018 -  Chemotherapy   The patient had palonosetron (ALOXI) injection 0.25 mg, 0.25 mg, Intravenous,  Once, 8 of 10 cycles Administration: 0.25 mg (09/27/2018), 0.25 mg (10/25/2018), 0.25 mg (11/23/2018), 0.25 mg (12/21/2018), 0.25 mg (01/18/2019), 0.25 mg (02/15/2019), 0.25 mg (03/15/2019) pegfilgrastim (NEULASTA ONPRO KIT) injection 6 mg, 6 mg, Subcutaneous, Once, 7 of 9 cycles Administration: 6 mg (10/25/2018), 6 mg (11/23/2018), 6 mg (12/21/2018), 6 mg (01/18/2019), 6 mg (02/15/2019), 6 mg (03/15/2019) pegfilgrastim-cbqv (UDENYCA) injection 6 mg, 6 mg, Subcutaneous, Once, 1 of 1 cycle Administration: 6 mg (09/29/2018) cabazitaxel (JEVTANA) 35 mg in dextrose 5 % 250 mL chemo infusion, 15 mg/m2 = 35 mg (100 % of original dose 15 mg/m2), Intravenous,  Once, 8 of 10 cycles Dose modification: 15 mg/m2 (original dose 15 mg/m2, Cycle 1, Reason: Provider Judgment), 20 mg/m2 (original dose 15 mg/m2, Cycle 2, Reason: Provider Judgment), 20 mg/m2 (original dose 20 mg/m2, Cycle 5, Reason: Patient Age, Comment: Change in jevtana orderable), 15 mg/m2 (75 % of original dose 20 mg/m2, Cycle 8, Reason: Other (see comments), Comment: nausea, worsening numbness) Administration: 35 mg (09/27/2018), 46 mg (10/25/2018), 46 mg (11/23/2018), 46 mg (12/21/2018), 46 mg (01/18/2019), 46 mg (02/15/2019), 46 mg (03/15/2019)  for chemotherapy treatment.    Bone metastases ([place])  12/26/2015 Initial Diagnosis   Bone metastases ([place])   09/27/2018 -  Chemotherapy   The patient had palonosetron (ALOXI) injection 0.25 mg, 0.25 mg, Intravenous,  Once, 4 of 6 cycles Administration: 0.25 mg (09/27/2018), 0.25 mg (10/25/2018), 0.25 mg (11/23/2018), 0.25 mg (12/21/2018) pegfilgrastim (NEULASTA ONPRO KIT) injection 6 mg, 6 mg, Subcutaneous, Once, 3 of 5 cycles Administration: 6 mg (10/25/2018), 6 mg (11/23/2018), 6 mg (12/21/2018) pegfilgrastim-cbqv (UDENYCA) injection 6 mg, 6 mg, Subcutaneous, Once, 1 of 1 cycle Administration: 6  mg (09/29/2018) cabazitaxel (JEVTANA) 35 mg in dextrose 5 % 250 mL chemo infusion, 15 mg/m2 = 35 mg (100 % of original dose 15 mg/m2), Intravenous,  Once, 4 of 6 cycles Dose modification: 15 mg/m2 (original dose 15 mg/m2, Cycle 1, Reason: Provider Judgment), 20 mg/m2 (original dose 15 mg/m2, Cycle 2, Reason: Provider Judgment) Administration: 35 mg (09/27/2018), 46 mg (10/25/2018), 46 mg (11/23/2018), 46 mg (12/21/2018)  for chemotherapy treatment.       CANCER STAGING: Cancer Staging Prostate carcinoma [facility]) Staging form: Prostate, AJCC 7th Edition - Clinical stage from 12/24/2015: Stage IV (TX, N0, M1b, PSA: 20 or greater) - Signed by Baird Cancer, PA-C on 01/07/2016    INTERVAL HISTORY:  Mr. Chandley 63 y.o. male seen for follow-up of metastatic prostate cancer to the bones.  Cycle 7 was on 03/15/2019.  After last cycle he developed worsening numbness in the feet and also experienced more nausea but denied any vomiting.  He also feels slightly lightheaded.  Appetite and energy levels are 50%.  Pain in the back is reported as 5 out of 10.  He is taking hydrocodone anywhere between 1-5 pills a day.  Denies any diarrhea or constipation.  Has sleep problems secondary to pain.  Denies any fevers or chills.  REVIEW OF SYSTEMS:  Review of Systems  Gastrointestinal: Positive for nausea.  Musculoskeletal: Positive for back pain.  Neurological: Positive for numbness.  All other systems reviewed and are negative.    PAST MEDICAL/SURGICAL HISTORY:  Past Medical History:  Diagnosis Date  . Arthritis  Left knee  . Bone cancer ([place])   . Bone metastases ([place]) 12/26/2015  . Cancer [facility])    Prostate  . DVT (deep venous thrombosis) ([employer]) 2004  . Family history of prostate cancer   . Family history of stomach cancer   . Kidney stones    ~2002  . Prostate carcinoma ([place]) 12/26/2015   Past Surgical History:  Procedure Laterality Date  . CIRCUMCISION  30 years ago  . MULTIPLE EXTRACTIONS WITH  ALVEOLOPLASTY N/A 03/06/2016   Procedure: Extraction of tooth #'s 3,5,6,8-12,20-22, 27, 28, and 31 with alveoloplasty and bilateral mandibular tori reductions;  Surgeon: Lenn Cal, DDS;  Location: [facility];  Service: Oral Surgery;  Laterality: N/A;     SOCIAL HISTORY:  Social History   Socioeconomic History  . Marital status: Single    Spouse name: Not on file  . Number of children: 2  . Years of education: Not on file  . Highest education level: Not on file  Occupational History  . Occupation: Research scientist (life sciences)  Social Needs  . Financial resource strain: Not on file  . Food insecurity    Worry: Not on file    Inability: Not on file  . Transportation needs    Medical: Not on file    Non-medical: Not on file  Tobacco Use  . Smoking status: Current Every Day Smoker    Packs/day: 1.00    Years: 50.00    Pack years: 50.00    Types: Cigarettes    Start date: 06/16/1965  . Smokeless tobacco: Former Systems developer    Types: [place] date: 08/04/2005  Substance and Sexual Activity  . Alcohol use: No    Alcohol/week: 0.0 standard drinks  . Drug use: Yes    Types: Marijuana    Comment: THC for appetite; occ  . Sexual activity: Not on file  Lifestyle  . Physical activity    Days per week: Not on file    Minutes per session: Not on file  . Stress: Not on file  Relationships  . Social Herbalist on phone: Not on file    Gets together: Not on file    Attends religious service: Not on file    Active member of club or organization: Not on file    Attends meetings of clubs or organizations: Not on file    Relationship status: Not on file  . Intimate partner violence    Fear of current or ex partner: Not on file    Emotionally abused: Not on file    Physically abused: Not on file    Forced sexual activity: Not on file  Other Topics Concern  . Not on file  Social History Narrative  . Not on file    FAMILY HISTORY:  Family History  Problem Relation Age of  Onset  . Stomach cancer Mother        dx 85s  . Aneurysm Maternal Aunt        brain  . Alcohol abuse Maternal Uncle   . Stomach cancer Maternal Grandmother        dx 50s-60s  . Prostate cancer Maternal Uncle        dx 54s    CURRENT MEDICATIONS:  Outpatient Encounter Medications as of 04/12/2019  Medication Sig  . Calcium Carb-Cholecalciferol (CALCIUM 500 + D3) 500-600 MG-UNIT TABS Take 2 tablets by mouth daily.  . ergocalciferol (VITAMIN D2) 50000 units capsule Take 1 capsule (50,000 Units total) by  mouth every Friday.  . fentaNYL (DURAGESIC) 75 MCG/HR Place 1 patch onto the skin every 3 (three) days.  Marland Kitchen HYDROcodone-acetaminophen (NORCO) 10-325 MG tablet Take 1 tablet by mouth every 4 (four) hours as needed.  . lubiprostone (AMITIZA) 24 MCG capsule Take 1 capsule (24 mcg total) by mouth 2 (two) times daily with a meal.  . omeprazole (PRILOSEC) 20 MG capsule Take 1 capsule by mouth once daily  . predniSONE (DELTASONE) 10 MG tablet Take 1 tablet (10 mg total) by mouth daily.  . tamsulosin (FLOMAX) 0.4 MG CAPS capsule Take 1 capsule (0.4 mg total) by mouth at bedtime.  . temazepam (RESTORIL) 15 MG capsule Take 1 capsule (15 mg total) by mouth at bedtime as needed for sleep.  . Wheat Dextrin (BENEFIBER PO) Take by mouth as needed.  . [DISCONTINUED] fentaNYL (DURAGESIC) 75 MCG/HR Place 1 patch onto the skin every 3 (three) days.  . [DISCONTINUED] HYDROcodone-acetaminophen (NORCO) 10-325 MG tablet Take 1 tablet by mouth every 4 (four) hours as needed.  . chlorhexidine (PERIDEX) 0.12 % solution Rinse with 15 mls twice daily for 30 seconds. Spit out excess. Do not swallow.  . lidocaine (LIDODERM) 5 % Place 1 patch onto the skin daily. Remove & Discard patch within 12 hours or as directed by MD  . lidocaine-prilocaine (EMLA) cream Apply a quarter size amount to port site 1 hour prior to chemo. Do not rub in. Cover with plastic wrap. (Patient not taking: Reported on 04/12/2019)  . ondansetron  (ZOFRAN) 8 MG tablet TAKE ONE TABLET BY MOUTH EVERY 8 HOURS AS NEEDED FOR NAUSEA OR VOMITING (Patient not taking: Reported on 04/12/2019)  . prochlorperazine (COMPAZINE) 10 MG tablet Take 1 tablet (10 mg total) by mouth every 6 (six) hours as needed for nausea or vomiting. (Patient not taking: Reported on 04/12/2019)  . [DISCONTINUED] abiraterone acetate (ZYTIGA) 250 MG tablet Take 4 tablets (1,000 mg total) by mouth daily. Take on an empty stomach 1 hour before or 2 hours after a meal (Patient not taking: Reported on 04/12/2019)   Facility-Administered Encounter Medications as of 04/12/2019  Medication  . sodium chloride flush (NS) 0.9 % injection 10 mL    ALLERGIES:  Allergies  Allergen Reactions  . Meloxicam Other (See Comments)    Caused stomach bleeding.     PHYSICAL EXAM:  ECOG Performance status: 1  Vitals:   04/12/19 0932  BP: (!) 89/65  Pulse: 96  Resp: 20  Temp: (!) 97.5 F (36.4 C)  SpO2: 98%   Filed Weights   04/12/19 0932  Weight: 233 lb (105.7 kg)    Physical Exam Vitals signs reviewed.  Constitutional:      Appearance: Normal appearance.  Cardiovascular:     Rate and Rhythm: Normal rate and regular rhythm.     Heart sounds: Normal heart sounds.  Pulmonary:     Effort: Pulmonary effort is normal.     Breath sounds: Normal breath sounds.  Abdominal:     General: There is no distension.     Palpations: Abdomen is soft. There is no mass.  Musculoskeletal:        General: No swelling.  Skin:    General: Skin is warm.  Neurological:     General: No focal deficit present.     Mental Status: He is alert and oriented to person, place, and time.  Psychiatric:        Mood and Affect: Mood normal.        Behavior: Behavior normal.  LABORATORY DATA:  I have reviewed the labs as listed.  CBC    Component Value Date/Time   WBC 5.8 04/12/2019 0909   RBC 4.24 04/12/2019 0909   HGB 12.9 (L) 04/12/2019 0909   HCT 40.9 04/12/2019 0909   PLT 215 04/12/2019  0909   MCV 96.5 04/12/2019 0909   MCH 30.4 04/12/2019 0909   MCHC 31.5 04/12/2019 0909   RDW 15.0 04/12/2019 0909   LYMPHSABS 0.8 04/12/2019 0909   MONOABS 0.5 04/12/2019 0909   EOSABS 0.1 04/12/2019 0909   BASOSABS 0.0 04/12/2019 0909   CMP Latest Ref Rng & Units 04/12/2019 03/15/2019 02/15/2019  Glucose 70 - 99 mg/dL 113(H) 101(H) 110(H)  BUN 8 - 23 mg/dL _0 Creatinine 0.61 - 1.24 mg/dL 0.97 0.96 1.13  Sodium 135 - 145 mmol/L 139 141 140  Potassium 3.5 - 5.1 mmol/L 3.7 3.7 3.7  Chloride 98 - 111 mmol/L 104 109 107  CO2 22 - 32 mmol/L _1 Calcium 8.9 - 10.3 mg/dL 8.3(L) 8.9 8.6(L)  Total Protein 6.5 - 8.1 g/dL 6.4(L) 6.6 6.1(L)  Total Bilirubin 0.3 - 1.2 mg/dL 0.5 0.6 0.5  Alkaline Phos 38 - 126 U/L 67 54 56  AST 15 - 41 U/L 12(L) 11(L) 13(L)  ALT 0 - 44 U/L _2 DIAGNOSTIC IMAGING:  I have independently reviewed the scans and discussed with the patient.   I have reviewed Venita Lick LPN's note and agree with the documentation.  I personally performed a face-to-face visit, made revisions and my assessment and plan is as follows.    ASSESSMENT & PLAN:   Prostate carcinoma ([place]) 1.  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 -Abiraterone/prednisone from 12/02/2017 through January 2020 with progression. 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  No BRCA related mutation detected. -[name] 22.5 mg on 03/15/2019. - 7 cycles of cabazitaxel from 09/27/2018 through 03/15/2019.  He is taking prednisone 10 mg daily. - Last PSA was 48.97 on 03/15/2019.  This is consistently increasing. -We reviewed results of the CT AP on 04/08/2019 which showed stable exam. - Bone scan on 04/05/2019 showed scattered osseous metastasis, similar in distribution to prior study.  There is grade 2 intensity of uptake at the left anterior iliac lesion compared to prior  exam.  No new sites. - Even though his PSA is going up, his scans were stable.  Hence we have recommended continuation of cabazitaxel. -After last cycle, he experienced worsening numbness in the feet.  He also experienced more nausea.  Hence I will cut back on the dose of cabazitaxel to 15 mg per metered square. -He will also receive 1 L of fluid as his blood pressure is 89/65 today.  We will see him back in 4 weeks for follow-up.  2.  Upper back pain: -He is using fentanyl 75 mcg patch.  Is taking hydrocodone 10 mg between 1-5 tablets/day for breakthrough pain.  He still has pain in the upper back area between the scapula.  I have recommended trying lidocaine patch 12 hours on 12 hours off.  I have sent a prescription for it.  3.  Bone metastasis: - Denosumab on hold since 12/21/2018 as he developed ulcer in the right lower jaw alveolar ridge.    Total time spent is 25 minutes with more than 50% of the time spent face-to-face discussing treatment plan,  counseling and coordination of care.    Orders placed this encounter:  No orders of the defined types were placed in this encounter.     Derek Jack, MD [place] [phone]

## 2019-04-12 NOTE — Progress Notes (Signed)
Patient seen by Dr. Delton Coombes with lab review and ok to treat today verbal order.  Patient to receive hydration with magnesium and potassium today.  No s/s of distress noted.  Patient stated fatigue and feeling bad.   Patient tolerated chemotherapy with no complaints voiced.  Port site clean and dry with no bruising or swelling noted at site.  Good blood return noted before and after administration of chemotherapy.  Band aid applied.  Neulasta Onpro applied to left arm with green indicator light flashing.  Patient left ambulatory with VSS and no s/s of distress noted.

## 2019-04-12 NOTE — Assessment & Plan Note (Signed)
1.  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 -Abiraterone/prednisone from 12/02/2017 through January 2020 with progression. 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  No BRCA related mutation detected. -[name] 22.5 mg on 03/15/2019. - 7 cycles of cabazitaxel from 09/27/2018 through 03/15/2019.  He is taking prednisone 10 mg daily. - Last PSA was 48.97 on 03/15/2019.  This is consistently increasing. -We reviewed results of the CT AP on 04/08/2019 which showed stable exam. - Bone scan on 04/05/2019 showed scattered osseous metastasis, similar in distribution to prior study.  There is grade 2 intensity of uptake at the left anterior iliac lesion compared to prior exam.  No new sites. - Even though his PSA is going up, his scans were stable.  Hence we have recommended continuation of cabazitaxel. -After last cycle, he experienced worsening numbness in the feet.  He also experienced more nausea.  Hence I will cut back on the dose of cabazitaxel to 15 mg per metered square. -He will also receive 1 L of fluid as his blood pressure is 89/65 today.  We will see him back in 4 weeks for follow-up.  2.  Upper back pain: -He is using fentanyl 75 mcg patch.  Is taking hydrocodone 10 mg between 1-5 tablets/day for breakthrough pain.  He still has pain in the upper back area between the scapula.  I have recommended trying lidocaine patch 12 hours on 12 hours off.  I have sent a prescription for it.  3.  Bone metastasis: - Denosumab on hold since 12/21/2018 as he developed ulcer in the right lower jaw alveolar ridge.

## 2019-05-10 ENCOUNTER — Other Ambulatory Visit: Payer: Self-pay

## 2019-05-10 ENCOUNTER — Inpatient Hospital Stay (HOSPITAL_COMMUNITY): Payer: Medicare Other

## 2019-05-10 ENCOUNTER — Inpatient Hospital Stay (HOSPITAL_BASED_OUTPATIENT_CLINIC_OR_DEPARTMENT_OTHER): Payer: Medicare Other | Admitting: Hematology

## 2019-05-10 ENCOUNTER — Encounter (HOSPITAL_COMMUNITY): Payer: Self-pay | Admitting: Hematology

## 2019-05-10 ENCOUNTER — Inpatient Hospital Stay (HOSPITAL_COMMUNITY): Payer: Medicare Other | Attending: Hematology

## 2019-05-10 VITALS — BP 115/73 | HR 78 | Temp 96.9°F | Resp 18

## 2019-05-10 DIAGNOSIS — C7951 Secondary malignant neoplasm of bone: Secondary | ICD-10-CM

## 2019-05-10 DIAGNOSIS — K219 Gastro-esophageal reflux disease without esophagitis: Secondary | ICD-10-CM

## 2019-05-10 DIAGNOSIS — Z23 Encounter for immunization: Secondary | ICD-10-CM | POA: Diagnosis not present

## 2019-05-10 DIAGNOSIS — C61 Malignant neoplasm of prostate: Secondary | ICD-10-CM

## 2019-05-10 DIAGNOSIS — G893 Neoplasm related pain (acute) (chronic): Secondary | ICD-10-CM

## 2019-05-10 DIAGNOSIS — Z5111 Encounter for antineoplastic chemotherapy: Secondary | ICD-10-CM | POA: Diagnosis not present

## 2019-05-10 LAB — CBC WITH DIFFERENTIAL/PLATELET
Abs Immature Granulocytes: 0.01 10*3/uL (ref 0.00–0.07)
Basophils Absolute: 0 10*3/uL (ref 0.0–0.1)
Basophils Relative: 1 %
Eosinophils Absolute: 0.2 10*3/uL (ref 0.0–0.5)
Eosinophils Relative: 3 %
HCT: 40.6 % (ref 39.0–52.0)
Hemoglobin: 13.1 g/dL (ref 13.0–17.0)
Immature Granulocytes: 0 %
Lymphocytes Relative: 15 %
Lymphs Abs: 0.8 10*3/uL (ref 0.7–4.0)
MCH: 30.9 pg (ref 26.0–34.0)
MCHC: 32.3 g/dL (ref 30.0–36.0)
MCV: 95.8 fL (ref 80.0–100.0)
Monocytes Absolute: 0.4 10*3/uL (ref 0.1–1.0)
Monocytes Relative: 8 %
Neutro Abs: 3.8 10*3/uL (ref 1.7–7.7)
Neutrophils Relative %: 73 %
Platelets: 252 10*3/uL (ref 150–400)
RBC: 4.24 MIL/uL (ref 4.22–5.81)
RDW: 15.2 % (ref 11.5–15.5)
WBC: 5.2 10*3/uL (ref 4.0–10.5)
nRBC: 0 % (ref 0.0–0.2)

## 2019-05-10 LAB — COMPREHENSIVE METABOLIC PANEL
ALT: 13 U/L (ref 0–44)
AST: 14 U/L — ABNORMAL LOW (ref 15–41)
Albumin: 3.6 g/dL (ref 3.5–5.0)
Alkaline Phosphatase: 69 U/L (ref 38–126)
Anion gap: 7 (ref 5–15)
BUN: 11 mg/dL (ref 8–23)
CO2: 27 mmol/L (ref 22–32)
Calcium: 9.2 mg/dL (ref 8.9–10.3)
Chloride: 104 mmol/L (ref 98–111)
Creatinine, Ser: 0.97 mg/dL (ref 0.61–1.24)
GFR calc Af Amer: >60 mL/min (ref >60)
GFR calc non Af Amer: >60 mL/min (ref >60)
Glucose, Bld: 122 mg/dL — ABNORMAL HIGH (ref 70–99)
Potassium: 3.7 mmol/L (ref 3.5–5.1)
Sodium: 138 mmol/L (ref 135–145)
Total Bilirubin: 0.5 mg/dL (ref 0.3–1.2)
Total Protein: 6.6 g/dL (ref 6.5–8.1)

## 2019-05-10 MED ORDER — PALONOSETRON HCL INJECTION 0.25 MG/5ML
0.2500 mg | Freq: Once | INTRAVENOUS | Status: AC
  Administered 2019-05-10: 0.25 mg via INTRAVENOUS
  Filled 2019-05-10: qty 5

## 2019-05-10 MED ORDER — SODIUM CHLORIDE 0.9 % IV SOLN
Freq: Once | INTRAVENOUS | Status: AC
  Administered 2019-05-10: 14:00:00 via INTRAVENOUS
  Filled 2019-05-10: qty 10

## 2019-05-10 MED ORDER — DIPHENHYDRAMINE HCL 50 MG/ML IJ SOLN
25.0000 mg | Freq: Once | INTRAMUSCULAR | Status: AC
  Administered 2019-05-10: 25 mg via INTRAVENOUS
  Filled 2019-05-10: qty 1

## 2019-05-10 MED ORDER — PEGFILGRASTIM 6 MG/0.6ML ~~LOC~~ PSKT
6.0000 mg | PREFILLED_SYRINGE | Freq: Once | SUBCUTANEOUS | Status: AC
  Administered 2019-05-10: 6 mg via SUBCUTANEOUS
  Filled 2019-05-10: qty 0.6

## 2019-05-10 MED ORDER — FENTANYL 75 MCG/HR TD PT72: 1.0000 | MEDICATED_PATCH | TRANSDERMAL | 0 refills | Status: DC

## 2019-05-10 MED ORDER — SODIUM CHLORIDE 0.9 % IV SOLN
10.0000 mg | Freq: Once | INTRAVENOUS | Status: AC
  Administered 2019-05-10: 12:00:00 10 mg via INTRAVENOUS
  Filled 2019-05-10: qty 10

## 2019-05-10 MED ORDER — SODIUM CHLORIDE 0.9 % IV SOLN
Freq: Once | INTRAVENOUS | Status: AC
  Administered 2019-05-10: 11:00:00 via INTRAVENOUS

## 2019-05-10 MED ORDER — OMEPRAZOLE 20 MG PO CPDR: 20.0000 mg | DELAYED_RELEASE_CAPSULE | Freq: Every day | ORAL | 2 refills | Status: DC

## 2019-05-10 MED ORDER — HYDROCODONE-ACETAMINOPHEN 10-325 MG PO TABS: 1.0000 | ORAL_TABLET | Freq: Four times a day (QID) | ORAL | 0 refills | Status: DC | PRN

## 2019-05-10 MED ORDER — HEPARIN SOD (PORK) LOCK FLUSH 100 UNIT/ML IV SOLN
500.0000 [IU] | Freq: Once | INTRAVENOUS | Status: AC | PRN
  Administered 2019-05-10: 500 [IU]

## 2019-05-10 MED ORDER — SODIUM CHLORIDE 0.9 % IV SOLN
15.0000 mg/m2 | Freq: Once | INTRAVENOUS | Status: AC
  Administered 2019-05-10: 35 mg via INTRAVENOUS
  Filled 2019-05-10: qty 3.5

## 2019-05-10 MED ORDER — INFLUENZA VAC SPLIT QUAD 0.5 ML IM SUSY
PREFILLED_SYRINGE | INTRAMUSCULAR | Status: AC
  Filled 2019-05-10: qty 0.5

## 2019-05-10 MED ORDER — SODIUM CHLORIDE 0.9% FLUSH
10.0000 mL | INTRAVENOUS | Status: DC | PRN
  Administered 2019-05-10: 15:00:00 10 mL
  Filled 2019-05-10: qty 10

## 2019-05-10 MED ORDER — FAMOTIDINE IN NACL 20-0.9 MG/50ML-% IV SOLN
20.0000 mg | Freq: Once | INTRAVENOUS | Status: AC
  Administered 2019-05-10: 20 mg via INTRAVENOUS
  Filled 2019-05-10: qty 50

## 2019-05-10 MED ORDER — INFLUENZA VAC SPLIT QUAD 0.5 ML IM SUSY
0.5000 mL | PREFILLED_SYRINGE | Freq: Once | INTRAMUSCULAR | Status: AC
  Administered 2019-05-10: 0.5 mL via INTRAMUSCULAR

## 2019-05-10 NOTE — Progress Notes (Signed)
VO received from Dr. Delton Coombes to proceed with tx today and give pt 1 liter of hydration/ house wine with Magnesium an dK+. Labs within parameters for tx.   MD aware of HR 116. Pt seen by Dr. Delton Coombes and VO received to proceed with tx. Labs reviewed.   HR recheck 90.   Treatment given today per MD orders. Tolerated infusion without adverse affects. Vital signs stable. No complaints at this time. Discharged from clinic ambulatory. F/U with [facility] as scheduled.

## 2019-05-10 NOTE — Assessment & Plan Note (Signed)
1.  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 -Abiraterone/prednisone from 12/02/2017 through January 2020 with progression. 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  No BRCA related mutation detected. -[name] 22.5 mg on 03/15/2019. - 7 cycles of cabazitaxel from 09/27/2018 through 03/15/2019.  He is taking prednisone 10 mg daily. -CT CAP on 04/08/2019 showed stable exam. - Bone scan on 04/05/2019 showed scattered osseous metastasis, similar in distribution to prior study.  There is grade 2 intensity of uptake at the left anterior iliac lesion compared to prior exam.  No new sites. - PSA is continuously going up.  Last 1 was 50. -We cut back on the dose of cabazitaxel to 15 mg per metered square during last cycle secondary to neuropathy.  His neuropathy improved with dose reduction. - We reviewed his blood work.  He will proceed with next cycle today.  We will continue same dose reduction of cabazitaxel.  He felt better after fluids last time.  Hence we will give fluids today. -He will come back in 4 weeks for follow-up.   2.  Upper back pain: -Continue fentanyl 75 mcg patch. -Continue hydrocodone 10 mg between 1-3 tablets/day for breakthrough pain.  He gets short of breath if he takes more pills. -Use lidocaine patch without help.  3.  Bone metastasis: - Denosumab on hold since 12/21/2018 as he developed ulcer in the right lower jaw alveolar ridge.

## 2019-05-10 NOTE — Patient Instructions (Signed)
[facility] at [facility] Discharge Instructions  You were seen today by Dr. Katragadda. He went over your recent lab results. He will see you back in 4 weeks for labs and follow up.   Thank you for choosing [facility] at [facility] to provide your oncology and hematology care.  To afford each patient quality time with our provider, please arrive at least 15 minutes before your scheduled appointment time.   If you have a lab appointment with the Cancer Center please come in thru the  Main Entrance and check in at the main information desk  You need to re-schedule your appointment should you arrive 10 or more minutes late.  We strive to give you quality time with our providers, and arriving late affects you and other patients whose appointments are after yours.  Also, if you no show three or more times for appointments you may be dismissed from the clinic at the providers discretion.     Again, thank you for choosing [facility].  Our hope is that these requests will decrease the amount of time that you wait before being seen by our physicians.       _____________________________________________________________  Should you have questions after your visit to [facility], please contact our office at [phone] between the hours of 8:00 a.m. and 4:30 p.m.  Voicemails left after 4:00 p.m. will not be returned until the following business day.  For prescription refill requests, have your pharmacy contact our office and allow 72 hours.    Cancer Center Support Programs:   > Cancer Support Group  2nd Tuesday of the month 1pm-2pm, Journey Room    

## 2019-05-10 NOTE — Progress Notes (Signed)
Matthew [name], [name] [id number]   CLINIC:  Medical Oncology/Hematology  PCP:  [name], [place] [address] [phone]   REASON FOR VISIT:  Follow-up for prostate cancer   BRIEF ONCOLOGIC HISTORY:  Oncology History  Prostate carcinoma ([place])  12/10/2015 Imaging   CTA chest, multiple thoracic and rib osseous lesions. no primary evident. Atherosclerosis including aortic and CAD   12/12/2015 Imaging   CT abdomen/pelvis with sclerotic osseous lesions througout the lumbar spine, sacrum, bilateral iliac bones and L proximal femur worrisome for sclerotic osseous mets,mild prostatomegaly, no LAD, Infrarenal 3.8 cm AAA   12/12/2015 Tumor Marker   PSA 153.85   12/18/2015 Imaging   Bone Scan Diffuse bony metastatic disease, posterior calvarium, sternum, thoracic, lumbar spine, bilateral ribs, L shoulder, bilateral bony pelvis, proximal L femur   12/24/2015 Initial Biopsy   CT biopsy of L iliac bone lesion   12/26/2015 Pathology Results   Bone, biopsy, left iliac - POSITIVE FOR METASTATIC ADENOCARCINOMA.   12/28/2015 - 09/01/2016 Chemotherapy   Firmagon instituted 240 mg    01/01/2016 Procedure   Port placed by IR.   01/07/2016 - 04/11/2016 Chemotherapy   Docetaxel every 21 days x 6 cycles with dose reductions due to tolerance   02/18/2016 Adverse Reaction   GI toxicity from docetaxel, dose reduced to 60 mg/m2   02/18/2016 Treatment Plan Change   Docetaxel dose reduced by 20%   03/06/2016 Procedure   Dr. Enrique Sack- Multiple extraction of tooth numbers 3, 5, 6, 8, 9, 10, 11, 12, 20, 21, 22, 27, 28, and 31. 4 Quadrants of alveoloplasty. Bilateral mandibular lingual tori reductions.   04/09/2016 Imaging   Brain MRI 1. No intracranial metastatic disease 2. No acute intracranial abnormality 3. Findings of chronic microvascular disease   05/14/2016 Imaging   CT C/A/P Stable appearing diffuse bony metastatic disease. No new  significant disease is seen Innumerable patchy sclerotic osseous metastases throughout the axial and proximal appendicular skeleton, stable in size and distribution, significantly increased in density in the interval, likely reflecting treatment effect. 2. No new or progressive metastatic disease in the chest, abdomen or pelvis. 3. Bilateral perifissural pulmonary nodules are stable and probably benign. 4. Aortic atherosclerosis. Infrarenal 4.3 cm abdominal aortic aneurysm, minimally increased in size. Recommend followup by ultrasound in 1 year.   06/02/2016 Tumor Marker   PSA 0.99 ng/ml    - 07/31/2016 Radiation Therapy   Palliative XRT by Dr. Lianne Cure in [place].    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  The Multi-Gene Panel offered by [facility] includes sequencing and/or deletion duplication testing of the following 85 genes: AIP, ALK, APC, ATM, AXIN2,BAP1,  BARD1, BLM, BMPR1A, BRCA1, BRCA2, BRIP1, CASR, CDC73, CDH1, CDK4, CDKN1B, CDKN1C, CDKN2A (p14ARF), CDKN2A (p16INK4a), CEBPA, CHEK2, CTNNA1, DICER1, DIS3L2, EGFR (c.2369C>T, p.Thr790Met variant only), EPCAM (Deletion/duplication testing only), FH, FLCN, GATA2, GPC3, GREM1 (Promoter region deletion/duplication testing only), HOXB13 (c.251G>A, p.Gly84Glu), HRAS, KIT, MAX, MEN1, MET, MITF (c.952G>A, p.Glu318Lys variant only), MLH1, MSH2, MSH3, MSH6, MUTYH, NBN, NF1, NF2, NTHL1, PALB2, PDGFRA, PHOX2B, PMS2, POLD1, POLE, POT1, PRKAR1A, PTCH1, PTEN, RAD50, RAD51C, RAD51D, RB1, RECQL4, RET, RNF43, RUNX1, SDHAF2, SDHA (sequence changes only), SDHB, SDHC, SDHD, SMAD4, SMARCA4, SMARCB1, SMARCE1, STK11, SUFU, TERC, TERT, TMEM127, TP53, TSC1, TSC2, VHL, WRN and WT1.  The report date is August 24, 2018  This  is not thought to increase the risk for colon cancer or other cancers, unless there is the presence of two  pathogenic mutations.   09/27/2018 -  Chemotherapy   The patient had palonosetron (ALOXI) injection 0.25 mg, 0.25 mg, Intravenous,  Once, 9 of 10 cycles Administration: 0.25 mg (09/27/2018), 0.25 mg (10/25/2018), 0.25 mg (11/23/2018), 0.25 mg (12/21/2018), 0.25 mg (01/18/2019), 0.25 mg (02/15/2019), 0.25 mg (03/15/2019), 0.25 mg (04/12/2019), 0.25 mg (05/10/2019) pegfilgrastim (NEULASTA ONPRO KIT) injection 6 mg, 6 mg, Subcutaneous, Once, 8 of 9 cycles Administration: 6 mg (10/25/2018), 6 mg (11/23/2018), 6 mg (12/21/2018), 6 mg (01/18/2019), 6 mg (02/15/2019), 6 mg (03/15/2019), 6 mg (04/12/2019), 6 mg (05/10/2019) pegfilgrastim-cbqv (UDENYCA) injection 6 mg, 6 mg, Subcutaneous, Once, 1 of 1 cycle Administration: 6 mg (09/29/2018) cabazitaxel (JEVTANA) 35 mg in dextrose 5 % 250 mL chemo infusion, 15 mg/m2 = 35 mg (100 % of original dose 15 mg/m2), Intravenous,  Once, 9 of 10 cycles Dose modification: 15 mg/m2 (original dose 15 mg/m2, Cycle 1, Reason: Provider Judgment), 20 mg/m2 (original dose 15 mg/m2, Cycle 2, Reason: Provider Judgment), 20 mg/m2 (original dose 20 mg/m2, Cycle 5, Reason: Patient Age, Comment: Change in jevtana orderable), 15 mg/m2 (75 % of original dose 20 mg/m2, Cycle 8, Reason: Other (see comments), Comment: nausea, worsening numbness) Administration: 35 mg (09/27/2018), 46 mg (10/25/2018), 46 mg (11/23/2018), 46 mg (12/21/2018), 46 mg (01/18/2019), 46 mg (02/15/2019), 46 mg (03/15/2019), 35 mg (04/12/2019), 35 mg (05/10/2019)  for chemotherapy treatment.    Bone metastases ([place])  12/26/2015 Initial Diagnosis   Bone metastases ([place])   09/27/2018 -  Chemotherapy   The patient had palonosetron (ALOXI) injection 0.25 mg, 0.25 mg, Intravenous,  Once, 4 of 6 cycles Administration: 0.25 mg (09/27/2018), 0.25 mg (10/25/2018), 0.25 mg (11/23/2018), 0.25 mg (12/21/2018) pegfilgrastim (NEULASTA ONPRO KIT) injection 6 mg, 6 mg, Subcutaneous, Once, 3 of 5 cycles Administration: 6 mg (10/25/2018), 6 mg (11/23/2018), 6 mg  (12/21/2018) pegfilgrastim-cbqv (UDENYCA) injection 6 mg, 6 mg, Subcutaneous, Once, 1 of 1 cycle Administration: 6 mg (09/29/2018) cabazitaxel (JEVTANA) 35 mg in dextrose 5 % 250 mL chemo infusion, 15 mg/m2 = 35 mg (100 % of original dose 15 mg/m2), Intravenous,  Once, 4 of 6 cycles Dose modification: 15 mg/m2 (original dose 15 mg/m2, Cycle 1, Reason: Provider Judgment), 20 mg/m2 (original dose 15 mg/m2, Cycle 2, Reason: Provider Judgment) Administration: 35 mg (09/27/2018), 46 mg (10/25/2018), 46 mg (11/23/2018), 46 mg (12/21/2018)  for chemotherapy treatment.       CANCER STAGING: Cancer Staging Prostate carcinoma [facility]) Staging form: Prostate, AJCC 7th Edition - Clinical stage from 12/24/2015: Stage IV (TX, N0, M1b, PSA: 20 or greater) - Signed by Baird Cancer, PA-C on 01/07/2016    INTERVAL HISTORY:  Mr. Glasscock 62 y.o. male seen for follow-up of metastatic pancreatic cancer to bones.  After last treatment, his numbness has felt better.  He only felt numbness for 3 to 4 days.  Appetite and energy levels are 25%.  Back pain is reasonably well controlled.  He tried lidocaine patch which did not help.  REVIEW OF SYSTEMS:  Review of Systems  Respiratory: Positive for shortness of breath.   Gastrointestinal: Positive for nausea.  Musculoskeletal: Positive for back pain.  Neurological: Positive for numbness.  All other systems reviewed and are negative.    PAST MEDICAL/SURGICAL HISTORY:  Past Medical History:  Diagnosis Date  . Arthritis    Left knee  . Bone cancer ([place])   . Bone metastases ([place]) 12/26/2015  .  Cancer [facility])    Prostate  . DVT (deep venous thrombosis) ([place]) 2004  . Family history of prostate cancer   . Family history of stomach cancer   . Kidney stones    ~2002  . Prostate carcinoma ([place]) 12/26/2015   Past Surgical History:  Procedure Laterality Date  . CIRCUMCISION  30 years ago  . MULTIPLE EXTRACTIONS WITH ALVEOLOPLASTY N/A 03/06/2016   Procedure: Extraction of  tooth #'s 3,5,6,8-12,20-22, 27, 28, and 31 with alveoloplasty and bilateral mandibular tori reductions;  Surgeon: Lenn Cal, DDS;  Location: [facility];  Service: Oral Surgery;  Laterality: N/A;     SOCIAL HISTORY:  Social History   Socioeconomic History  . Marital status: Single    Spouse name: Not on file  . Number of children: 2  . Years of education: Not on file  . Highest education level: Not on file  Occupational History  . Occupation: Research scientist (life sciences)  Social Needs  . Financial resource strain: Not on file  . Food insecurity    Worry: Not on file    Inability: Not on file  . Transportation needs    Medical: Not on file    Non-medical: Not on file  Tobacco Use  . Smoking status: Current Every Day Smoker    Packs/day: 1.00    Years: 50.00    Pack years: 50.00    Types: Cigarettes    Start date: 06/16/1965  . Smokeless tobacco: Former Systems developer    Types: [place] date: 08/04/2005  Substance and Sexual Activity  . Alcohol use: No    Alcohol/week: 0.0 standard drinks  . Drug use: Yes    Types: Marijuana    Comment: THC for appetite; occ  . Sexual activity: Not on file  Lifestyle  . Physical activity    Days per week: Not on file    Minutes per session: Not on file  . Stress: Not on file  Relationships  . Social Herbalist on phone: Not on file    Gets together: Not on file    Attends religious service: Not on file    Active member of club or organization: Not on file    Attends meetings of clubs or organizations: Not on file    Relationship status: Not on file  . Intimate partner violence    Fear of current or ex partner: Not on file    Emotionally abused: Not on file    Physically abused: Not on file    Forced sexual activity: Not on file  Other Topics Concern  . Not on file  Social History Narrative  . Not on file    FAMILY HISTORY:  Family History  Problem Relation Age of Onset  . Stomach cancer Mother        dx 5s  . Aneurysm  Maternal Aunt        brain  . Alcohol abuse Maternal Uncle   . Stomach cancer Maternal Grandmother        dx 50s-60s  . Prostate cancer Maternal Uncle        dx 64s    CURRENT MEDICATIONS:  Outpatient Encounter Medications as of 05/10/2019  Medication Sig  . Calcium Carb-Cholecalciferol (CALCIUM 500 + D3) 500-600 MG-UNIT TABS Take 2 tablets by mouth daily.  . chlorhexidine (PERIDEX) 0.12 % solution Rinse with 15 mls twice daily for 30 seconds. Spit out excess. Do not swallow.  . ergocalciferol (VITAMIN D2) 50000 units capsule Take  1 capsule (50,000 Units total) by mouth every Friday.  . fentaNYL (DURAGESIC) 75 MCG/HR Place 1 patch onto the skin every 3 (three) days.  Marland Kitchen HYDROcodone-acetaminophen (NORCO) 10-325 MG tablet Take 1 tablet by mouth every 6 (six) hours as needed.  . lidocaine (LIDODERM) 5 % Place 1 patch onto the skin daily. Remove & Discard patch within 12 hours or as directed by MD  . lidocaine-prilocaine (EMLA) cream Apply a quarter size amount to port site 1 hour prior to chemo. Do not rub in. Cover with plastic wrap.  . lubiprostone (AMITIZA) 24 MCG capsule Take 1 capsule (24 mcg total) by mouth 2 (two) times daily with a meal.  . omeprazole (PRILOSEC) 20 MG capsule Take 1 capsule (20 mg total) by mouth daily.  . ondansetron (ZOFRAN) 8 MG tablet TAKE ONE TABLET BY MOUTH EVERY 8 HOURS AS NEEDED FOR NAUSEA OR VOMITING  . predniSONE (DELTASONE) 10 MG tablet Take 1 tablet (10 mg total) by mouth daily.  . prochlorperazine (COMPAZINE) 10 MG tablet Take 1 tablet (10 mg total) by mouth every 6 (six) hours as needed for nausea or vomiting.  . tamsulosin (FLOMAX) 0.4 MG CAPS capsule Take 1 capsule (0.4 mg total) by mouth at bedtime.  . temazepam (RESTORIL) 15 MG capsule Take 1 capsule (15 mg total) by mouth at bedtime as needed for sleep.  . Wheat Dextrin (BENEFIBER PO) Take by mouth as needed.  . [DISCONTINUED] fentaNYL (DURAGESIC) 75 MCG/HR Place 1 patch onto the skin every 3 (three)  days.  . [DISCONTINUED] HYDROcodone-acetaminophen (NORCO) 10-325 MG tablet Take 1 tablet by mouth every 4 (four) hours as needed.  . [DISCONTINUED] omeprazole (PRILOSEC) 20 MG capsule Take 1 capsule by mouth once daily   Facility-Administered Encounter Medications as of 05/10/2019  Medication  . [COMPLETED] influenza vac split quadrivalent PF (FLUARIX) injection 0.5 mL  . sodium chloride flush (NS) 0.9 % injection 10 mL    ALLERGIES:  Allergies  Allergen Reactions  . Meloxicam Other (See Comments)    Caused stomach bleeding.     PHYSICAL EXAM:  ECOG Performance status: 1  Vitals:   05/10/19 0900  BP: 110/69  Pulse: (!) 116  Resp: 20  Temp: (!) 97.5 F (36.4 C)  SpO2: 99%   Filed Weights   05/10/19 0900  Weight: 233 lb 7 oz (105.9 kg)    Physical Exam Vitals signs reviewed.  Constitutional:      Appearance: Normal appearance.  Cardiovascular:     Rate and Rhythm: Normal rate and regular rhythm.     Heart sounds: Normal heart sounds.  Pulmonary:     Effort: Pulmonary effort is normal.     Breath sounds: Normal breath sounds.  Abdominal:     General: There is no distension.     Palpations: Abdomen is soft. There is no mass.  Musculoskeletal:        General: No swelling.  Skin:    General: Skin is warm.  Neurological:     General: No focal deficit present.     Mental Status: He is alert and oriented to person, place, and time.  Psychiatric:        Mood and Affect: Mood normal.        Behavior: Behavior normal.      LABORATORY DATA:  I have reviewed the labs as listed.  CBC    Component Value Date/Time   WBC 5.2 05/10/2019 0941   RBC 4.24 05/10/2019 0941   HGB 13.1 05/10/2019 0941  HCT 40.6 05/10/2019 0941   PLT 252 05/10/2019 0941   MCV 95.8 05/10/2019 0941   MCH 30.9 05/10/2019 0941   MCHC 32.3 05/10/2019 0941   RDW 15.2 05/10/2019 0941   LYMPHSABS 0.8 05/10/2019 0941   MONOABS 0.4 05/10/2019 0941   EOSABS 0.2 05/10/2019 0941   BASOSABS 0.0  05/10/2019 0941   CMP Latest Ref Rng & Units 05/10/2019 04/12/2019 03/15/2019  Glucose 70 - 99 mg/dL 122(H) 113(H) 101(H)  BUN 8 - 23 mg/dL _0 Creatinine 0.61 - 1.24 mg/dL 0.97 0.97 0.96  Sodium 135 - 145 mmol/L 138 139 141  Potassium 3.5 - 5.1 mmol/L 3.7 3.7 3.7  Chloride 98 - 111 mmol/L 104 104 109  CO2 22 - 32 mmol/L _1 Calcium 8.9 - 10.3 mg/dL 9.2 8.3(L) 8.9  Total Protein 6.5 - 8.1 g/dL 6.6 6.4(L) 6.6  Total Bilirubin 0.3 - 1.2 mg/dL 0.5 0.5 0.6  Alkaline Phos 38 - 126 U/L 69 67 54  AST 15 - 41 U/L 14(L) 12(L) 11(L)  ALT 0 - 44 U/L _2 DIAGNOSTIC IMAGING:  I have independently reviewed the scans and discussed with the patient.   I have reviewed Venita Lick LPN's note and agree with the documentation.  I personally performed a face-to-face visit, made revisions and my assessment and plan is as follows.    ASSESSMENT & PLAN:   Prostate carcinoma ([place]) 1.  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 -Abiraterone/prednisone from 12/02/2017 through January 2020 with progression. 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  No BRCA related mutation detected. -[name] 22.5 mg on 03/15/2019. - 7 cycles of cabazitaxel from 09/27/2018 through 03/15/2019.  He is taking prednisone 10 mg daily. -CT CAP on 04/08/2019 showed stable exam. - Bone scan on 04/05/2019 showed scattered osseous metastasis, similar in distribution to prior study.  There is grade 2 intensity of uptake at the left anterior iliac lesion compared to prior exam.  No new sites. - PSA is continuously going up.  Last 1 was 50. -We cut back on the dose of cabazitaxel to 15 mg per metered square during last cycle secondary to neuropathy.  His neuropathy improved with dose reduction. - We reviewed his blood work.  He will proceed with next cycle today.  We will continue same dose reduction  of cabazitaxel.  He felt better after fluids last time.  Hence we will give fluids today. -He will come back in 4 weeks for follow-up.   2.  Upper back pain: -Continue fentanyl 75 mcg patch. -Continue hydrocodone 10 mg between 1-3 tablets/day for breakthrough pain.  He gets short of breath if he takes more pills. -Use lidocaine patch without help.  3.  Bone metastasis: - Denosumab on hold since 12/21/2018 as he developed ulcer in the right lower jaw alveolar ridge.    Total time spent is 25 minutes with more than 50% of the time spent face-to-face discussing treatment plan, counseling and coordination of care.    Orders placed this encounter:  Orders Placed This Encounter  Procedures  . CBC with Differential/Platelet  . Comprehensive metabolic panel  . PSA      Derek Jack, MD [place] [phone]

## 2019-06-07 ENCOUNTER — Encounter (HOSPITAL_COMMUNITY): Payer: Self-pay | Admitting: Hematology

## 2019-06-07 ENCOUNTER — Ambulatory Visit (HOSPITAL_COMMUNITY): Payer: Medicare Other

## 2019-06-07 ENCOUNTER — Inpatient Hospital Stay (HOSPITAL_BASED_OUTPATIENT_CLINIC_OR_DEPARTMENT_OTHER): Payer: Medicare Other | Admitting: Hematology

## 2019-06-07 ENCOUNTER — Inpatient Hospital Stay (HOSPITAL_COMMUNITY): Payer: Medicare Other

## 2019-06-07 ENCOUNTER — Other Ambulatory Visit: Payer: Self-pay

## 2019-06-07 ENCOUNTER — Inpatient Hospital Stay (HOSPITAL_COMMUNITY): Payer: Medicare Other | Attending: Hematology

## 2019-06-07 VITALS — BP 103/73 | HR 75 | Temp 97.6°F | Resp 18 | Wt 237.2 lb

## 2019-06-07 DIAGNOSIS — Z800 Family history of malignant neoplasm of digestive organs: Secondary | ICD-10-CM | POA: Diagnosis not present

## 2019-06-07 DIAGNOSIS — Z515 Encounter for palliative care: Secondary | ICD-10-CM

## 2019-06-07 DIAGNOSIS — C7951 Secondary malignant neoplasm of bone: Secondary | ICD-10-CM

## 2019-06-07 DIAGNOSIS — Z5111 Encounter for antineoplastic chemotherapy: Secondary | ICD-10-CM | POA: Insufficient documentation

## 2019-06-07 DIAGNOSIS — M546 Pain in thoracic spine: Secondary | ICD-10-CM | POA: Diagnosis not present

## 2019-06-07 DIAGNOSIS — F17210 Nicotine dependence, cigarettes, uncomplicated: Secondary | ICD-10-CM | POA: Insufficient documentation

## 2019-06-07 DIAGNOSIS — C61 Malignant neoplasm of prostate: Secondary | ICD-10-CM

## 2019-06-07 DIAGNOSIS — G893 Neoplasm related pain (acute) (chronic): Secondary | ICD-10-CM | POA: Diagnosis not present

## 2019-06-07 DIAGNOSIS — Z86718 Personal history of other venous thrombosis and embolism: Secondary | ICD-10-CM | POA: Insufficient documentation

## 2019-06-07 DIAGNOSIS — Z8546 Personal history of malignant neoplasm of prostate: Secondary | ICD-10-CM | POA: Insufficient documentation

## 2019-06-07 LAB — CBC WITH DIFFERENTIAL/PLATELET
Abs Immature Granulocytes: 0.01 10*3/uL (ref 0.00–0.07)
Basophils Absolute: 0 10*3/uL (ref 0.0–0.1)
Basophils Relative: 1 %
Eosinophils Absolute: 0.2 10*3/uL (ref 0.0–0.5)
Eosinophils Relative: 4 %
HCT: 40.2 % (ref 39.0–52.0)
Hemoglobin: 12.9 g/dL — ABNORMAL LOW (ref 13.0–17.0)
Immature Granulocytes: 0 %
Lymphocytes Relative: 20 %
Lymphs Abs: 1.1 10*3/uL (ref 0.7–4.0)
MCH: 30.6 pg (ref 26.0–34.0)
MCHC: 32.1 g/dL (ref 30.0–36.0)
MCV: 95.5 fL (ref 80.0–100.0)
Monocytes Absolute: 0.5 10*3/uL (ref 0.1–1.0)
Monocytes Relative: 8 %
Neutro Abs: 3.8 10*3/uL (ref 1.7–7.7)
Neutrophils Relative %: 67 %
Platelets: 232 10*3/uL (ref 150–400)
RBC: 4.21 MIL/uL — ABNORMAL LOW (ref 4.22–5.81)
RDW: 14.9 % (ref 11.5–15.5)
WBC: 5.7 10*3/uL (ref 4.0–10.5)
nRBC: 0 % (ref 0.0–0.2)

## 2019-06-07 LAB — COMPREHENSIVE METABOLIC PANEL
ALT: 14 U/L (ref 0–44)
AST: 13 U/L — ABNORMAL LOW (ref 15–41)
Albumin: 3.6 g/dL (ref 3.5–5.0)
Alkaline Phosphatase: 72 U/L (ref 38–126)
Anion gap: 9 (ref 5–15)
BUN: 10 mg/dL (ref 8–23)
CO2: 26 mmol/L (ref 22–32)
Calcium: 9 mg/dL (ref 8.9–10.3)
Chloride: 104 mmol/L (ref 98–111)
Creatinine, Ser: 1.14 mg/dL (ref 0.61–1.24)
GFR calc Af Amer: >60 mL/min (ref >60)
GFR calc non Af Amer: >60 mL/min (ref >60)
Glucose, Bld: 119 mg/dL — ABNORMAL HIGH (ref 70–99)
Potassium: 3.7 mmol/L (ref 3.5–5.1)
Sodium: 139 mmol/L (ref 135–145)
Total Bilirubin: 0.4 mg/dL (ref 0.3–1.2)
Total Protein: 6.6 g/dL (ref 6.5–8.1)

## 2019-06-07 LAB — PSA: Prostatic Specific Antigen: 67.1 ng/mL — ABNORMAL HIGH (ref 0.00–4.00)

## 2019-06-07 MED ORDER — SODIUM CHLORIDE 0.9 % IV SOLN
10.0000 mg | Freq: Once | INTRAVENOUS | Status: AC
  Administered 2019-06-07: 10 mg via INTRAVENOUS
  Filled 2019-06-07: qty 10

## 2019-06-07 MED ORDER — PALONOSETRON HCL INJECTION 0.25 MG/5ML
0.2500 mg | Freq: Once | INTRAVENOUS | Status: AC
  Administered 2019-06-07: 0.25 mg via INTRAVENOUS
  Filled 2019-06-07: qty 5

## 2019-06-07 MED ORDER — LEUPROLIDE ACETATE (3 MONTH) 22.5 MG ~~LOC~~ KIT
22.5000 mg | PACK | Freq: Once | SUBCUTANEOUS | Status: AC
  Administered 2019-06-07: 22.5 mg via SUBCUTANEOUS
  Filled 2019-06-07: qty 22.5

## 2019-06-07 MED ORDER — PEGFILGRASTIM 6 MG/0.6ML ~~LOC~~ PSKT
6.0000 mg | PREFILLED_SYRINGE | Freq: Once | SUBCUTANEOUS | Status: AC
  Administered 2019-06-07: 6 mg via SUBCUTANEOUS
  Filled 2019-06-07: qty 0.6

## 2019-06-07 MED ORDER — DIPHENHYDRAMINE HCL 50 MG/ML IJ SOLN
25.0000 mg | Freq: Once | INTRAMUSCULAR | Status: AC
  Administered 2019-06-07: 25 mg via INTRAVENOUS
  Filled 2019-06-07: qty 1

## 2019-06-07 MED ORDER — FAMOTIDINE IN NACL 20-0.9 MG/50ML-% IV SOLN
20.0000 mg | Freq: Once | INTRAVENOUS | Status: AC
  Administered 2019-06-07: 20 mg via INTRAVENOUS
  Filled 2019-06-07: qty 50

## 2019-06-07 MED ORDER — HEPARIN SOD (PORK) LOCK FLUSH 100 UNIT/ML IV SOLN
500.0000 [IU] | Freq: Once | INTRAVENOUS | Status: AC | PRN
  Administered 2019-06-07: 500 [IU]

## 2019-06-07 MED ORDER — SODIUM CHLORIDE 0.9% FLUSH
10.0000 mL | INTRAVENOUS | Status: DC | PRN
  Administered 2019-06-07: 13:00:00 10 mL
  Filled 2019-06-07: qty 10

## 2019-06-07 MED ORDER — SODIUM CHLORIDE 0.9 % IV SOLN
15.0000 mg/m2 | Freq: Once | INTRAVENOUS | Status: AC
  Administered 2019-06-07: 12:00:00 35 mg via INTRAVENOUS
  Filled 2019-06-07: qty 3.5

## 2019-06-07 MED ORDER — SODIUM CHLORIDE 0.9 % IV SOLN
Freq: Once | INTRAVENOUS | Status: AC
  Administered 2019-06-07: 11:00:00 via INTRAVENOUS

## 2019-06-07 NOTE — Progress Notes (Signed)
Pt presents today for Jevtana and [name]. MAR reviewed and updated. Pt has no complaints of any pain or changes since the last visit.   Verbal order received from RNester NP. Proceed with tx. VS and labs reviewed by NP.   Treatment given today per MD orders. Tolerated infusion without adverse affects. Vital signs stable. No complaints at this time. Discharged from clinic ambulatory. F/U with [facility] as scheduled.

## 2019-06-07 NOTE — Patient Instructions (Signed)
[facility] Discharge Instructions for Patients Receiving Chemotherapy  Today you received the following chemotherapy agents   To help prevent nausea and vomiting after your treatment, we encourage you to take your nausea medication   If you develop nausea and vomiting that is not controlled by your nausea medication, call the clinic.   BELOW ARE SYMPTOMS THAT SHOULD BE REPORTED IMMEDIATELY:  *FEVER GREATER THAN 100.5 F  *CHILLS WITH OR WITHOUT FEVER  NAUSEA AND VOMITING THAT IS NOT CONTROLLED WITH YOUR NAUSEA MEDICATION  *UNUSUAL SHORTNESS OF BREATH  *UNUSUAL BRUISING OR BLEEDING  TENDERNESS IN MOUTH AND THROAT WITH OR WITHOUT PRESENCE OF ULCERS  *URINARY PROBLEMS  *BOWEL PROBLEMS  UNUSUAL RASH Items with * indicate a potential emergency and should be followed up as soon as possible.  Feel free to call the clinic should you have any questions or concerns. The clinic phone number is [phone].  Please show the CHEMO ALERT CARD at check-in to the Emergency Department and triage nurse.   

## 2019-06-08 DIAGNOSIS — Z515 Encounter for palliative care: Secondary | ICD-10-CM | POA: Insufficient documentation

## 2019-06-08 MED ORDER — HYDROCODONE-ACETAMINOPHEN 10-325 MG PO TABS: 1.0000 | ORAL_TABLET | Freq: Four times a day (QID) | ORAL | 0 refills | Status: DC | PRN

## 2019-06-08 MED ORDER — PROCHLORPERAZINE MALEATE 10 MG PO TABS: 10.0000 mg | ORAL_TABLET | Freq: Four times a day (QID) | ORAL | 1 refills | Status: DC | PRN

## 2019-06-08 MED ORDER — TRAZODONE HCL 100 MG PO TABS: 100.0000 mg | ORAL_TABLET | Freq: Every evening | ORAL | 3 refills | Status: DC | PRN

## 2019-06-09 NOTE — Progress Notes (Signed)
[place], [place] [address]   CLINIC:  Medical Oncology/Hematology  PCP:  [name], [place] [address] [phone]   REASON FOR VISIT:  Follow-up for Prostate Cancer   CURRENT THERAPY: cabazitaxel  BRIEF ONCOLOGIC HISTORY:  Oncology History  Prostate carcinoma ([place])  12/10/2015 Imaging   CTA chest, multiple thoracic and rib osseous lesions. no primary evident. Atherosclerosis including aortic and CAD   12/12/2015 Imaging   CT abdomen/pelvis with sclerotic osseous lesions througout the lumbar spine, sacrum, bilateral iliac bones and L proximal femur worrisome for sclerotic osseous mets,mild prostatomegaly, no LAD, Infrarenal 3.8 cm AAA   12/12/2015 Tumor Marker   PSA 153.85   12/18/2015 Imaging   Bone Scan Diffuse bony metastatic disease, posterior calvarium, sternum, thoracic, lumbar spine, bilateral ribs, L shoulder, bilateral bony pelvis, proximal L femur   12/24/2015 Initial Biopsy   CT biopsy of L iliac bone lesion   12/26/2015 Pathology Results   Bone, biopsy, left iliac - POSITIVE FOR METASTATIC ADENOCARCINOMA.   12/28/2015 - 09/01/2016 Chemotherapy   Firmagon instituted 240 mg    01/01/2016 Procedure   Port placed by IR.   01/07/2016 - 04/11/2016 Chemotherapy   Docetaxel every 21 days x 6 cycles with dose reductions due to tolerance   02/18/2016 Adverse Reaction   GI toxicity from docetaxel, dose reduced to 60 mg/m2   02/18/2016 Treatment Plan Change   Docetaxel dose reduced by 20%   03/06/2016 Procedure   Dr. Enrique Sack- Multiple extraction of tooth numbers 3, 5, 6, 8, 9, 10, 11, 12, 20, 21, 22, 27, 28, and 31. 4 Quadrants of alveoloplasty. Bilateral mandibular lingual tori reductions.   04/09/2016 Imaging   Brain MRI 1. No intracranial metastatic disease 2. No acute intracranial abnormality 3. Findings of chronic microvascular disease   05/14/2016 Imaging   CT C/A/P Stable appearing diffuse bony  metastatic disease. No new significant disease is seen Innumerable patchy sclerotic osseous metastases throughout the axial and proximal appendicular skeleton, stable in size and distribution, significantly increased in density in the interval, likely reflecting treatment effect. 2. No new or progressive metastatic disease in the chest, abdomen or pelvis. 3. Bilateral perifissural pulmonary nodules are stable and probably benign. 4. Aortic atherosclerosis. Infrarenal 4.3 cm abdominal aortic aneurysm, minimally increased in size. Recommend followup by ultrasound in 1 year.   06/02/2016 Tumor Marker   PSA 0.99 ng/ml    - 07/31/2016 Radiation Therapy   Palliative XRT by Dr. Lianne Cure in [place].    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  The Multi-Gene Panel offered by [facility] includes sequencing and/or deletion duplication testing of the following 85 genes: AIP, ALK, APC, ATM, AXIN2,BAP1,  BARD1, BLM, BMPR1A, BRCA1, BRCA2, BRIP1, CASR, CDC73, CDH1, CDK4, CDKN1B, CDKN1C, CDKN2A (p14ARF), CDKN2A (p16INK4a), CEBPA, CHEK2, CTNNA1, DICER1, DIS3L2, EGFR (c.2369C>T, p.Thr790Met variant only), EPCAM (Deletion/duplication testing only), FH, FLCN, GATA2, GPC3, GREM1 (Promoter region deletion/duplication testing only), HOXB13 (c.251G>A, p.Gly84Glu), HRAS, KIT, MAX, MEN1, MET, MITF (c.952G>A, p.Glu318Lys variant only), MLH1, MSH2, MSH3, MSH6, MUTYH, NBN, NF1, NF2, NTHL1, PALB2, PDGFRA, PHOX2B, PMS2, POLD1, POLE, POT1, PRKAR1A, PTCH1, PTEN, RAD50, RAD51C, RAD51D, RB1, RECQL4, RET, RNF43, RUNX1, SDHAF2, SDHA (sequence changes only), SDHB, SDHC, SDHD, SMAD4, SMARCA4, SMARCB1, SMARCE1, STK11, SUFU, TERC, TERT, TMEM127, TP53, TSC1, TSC2, VHL, WRN and WT1.  The report date is August 24, 2018  This is not thought to increase the risk for colon cancer or other cancers, unless there is  the presence of two pathogenic mutations.   09/27/2018 -  Chemotherapy   The patient had palonosetron (ALOXI) injection 0.25 mg, 0.25 mg, Intravenous,  Once, 10 of 10 cycles Administration: 0.25 mg (09/27/2018), 0.25 mg (10/25/2018), 0.25 mg (11/23/2018), 0.25 mg (12/21/2018), 0.25 mg (01/18/2019), 0.25 mg (02/15/2019), 0.25 mg (03/15/2019), 0.25 mg (04/12/2019), 0.25 mg (05/10/2019), 0.25 mg (06/07/2019) pegfilgrastim (NEULASTA ONPRO KIT) injection 6 mg, 6 mg, Subcutaneous, Once, 9 of 9 cycles Administration: 6 mg (10/25/2018), 6 mg (11/23/2018), 6 mg (12/21/2018), 6 mg (01/18/2019), 6 mg (02/15/2019), 6 mg (03/15/2019), 6 mg (04/12/2019), 6 mg (05/10/2019), 6 mg (06/07/2019) pegfilgrastim-cbqv (UDENYCA) injection 6 mg, 6 mg, Subcutaneous, Once, 1 of 1 cycle Administration: 6 mg (09/29/2018) cabazitaxel (JEVTANA) 35 mg in dextrose 5 % 250 mL chemo infusion, 15 mg/m2 = 35 mg (100 % of original dose 15 mg/m2), Intravenous,  Once, 10 of 10 cycles Dose modification: 15 mg/m2 (original dose 15 mg/m2, Cycle 1, Reason: Provider Judgment), 20 mg/m2 (original dose 15 mg/m2, Cycle 2, Reason: Provider Judgment), 20 mg/m2 (original dose 20 mg/m2, Cycle 5, Reason: Patient Age, Comment: Change in jevtana orderable), 15 mg/m2 (75 % of original dose 20 mg/m2, Cycle 8, Reason: Other (see comments), Comment: nausea, worsening numbness) Administration: 35 mg (09/27/2018), 46 mg (10/25/2018), 46 mg (11/23/2018), 46 mg (12/21/2018), 46 mg (01/18/2019), 46 mg (02/15/2019), 46 mg (03/15/2019), 35 mg (04/12/2019), 35 mg (05/10/2019), 35 mg (06/07/2019)  for chemotherapy treatment.    Bone metastases ([place])  12/26/2015 Initial Diagnosis   Bone metastases ([place])   09/27/2018 -  Chemotherapy   The patient had palonosetron (ALOXI) injection 0.25 mg, 0.25 mg, Intravenous,  Once, 4 of 6 cycles Administration: 0.25 mg (09/27/2018), 0.25 mg (10/25/2018), 0.25 mg (11/23/2018), 0.25 mg (12/21/2018) pegfilgrastim (NEULASTA ONPRO KIT) injection 6 mg, 6 mg, Subcutaneous,  Once, 3 of 5 cycles Administration: 6 mg (10/25/2018), 6 mg (11/23/2018), 6 mg (12/21/2018) pegfilgrastim-cbqv (UDENYCA) injection 6 mg, 6 mg, Subcutaneous, Once, 1 of 1 cycle Administration: 6 mg (09/29/2018) cabazitaxel (JEVTANA) 35 mg in dextrose 5 % 250 mL chemo infusion, 15 mg/m2 = 35 mg (100 % of original dose 15 mg/m2), Intravenous,  Once, 4 of 6 cycles Dose modification: 15 mg/m2 (original dose 15 mg/m2, Cycle 1, Reason: Provider Judgment), 20 mg/m2 (original dose 15 mg/m2, Cycle 2, Reason: Provider Judgment) Administration: 35 mg (09/27/2018), 46 mg (10/25/2018), 46 mg (11/23/2018), 46 mg (12/21/2018)  for chemotherapy treatment.       CANCER STAGING: Cancer Staging Prostate carcinoma [facility]) Staging form: Prostate, AJCC 7th Edition - Clinical stage from 12/24/2015: Stage IV (TX, N0, M1b, PSA: 20 or greater) - Signed by Baird Cancer, PA-C on 01/07/2016    INTERVAL HISTORY:  Mr. Hurta 63 y.o. male presents today for follow-up.  He reports overall doing fair.  He reports mild to moderate fatigue.  He continues with reports of chronic pain secondary to disease.  Pain is somewhat controlled with as needed oxycodone.  Patient also reports a occasional nausea.  Nausea is controlled with as needed antiemetics.  Patient overall is tolerating treatment well.  He is here for repeat labs and office visit.   REVIEW OF SYSTEMS:  Review of Systems  Constitutional: Positive for fatigue.  HENT:  Negative.   Respiratory: Negative.   Cardiovascular: Negative.   Gastrointestinal: Negative.   Endocrine: Negative.   Genitourinary: Negative.    Musculoskeletal:  Positive for arthralgias, back pain, gait problem and myalgias.  Skin: Negative.   Neurological: Positive for extremity weakness and gait problem.  Hematological: Negative.   Psychiatric/Behavioral: Negative.      PAST MEDICAL/SURGICAL HISTORY:  Past Medical History:  Diagnosis Date  . Arthritis    Left knee  . Bone cancer ([place])   .  Bone metastases ([place]) 12/26/2015  . Cancer [facility])    Prostate  . DVT (deep venous thrombosis) ([place]) 2004  . Family history of prostate cancer   . Family history of stomach cancer   . Kidney stones    ~2002  . Prostate carcinoma ([facility]) 12/26/2015   Past Surgical History:  Procedure Laterality Date  . CIRCUMCISION  30 years ago  . MULTIPLE EXTRACTIONS WITH ALVEOLOPLASTY N/A 03/06/2016   Procedure: Extraction of tooth #'s 3,5,6,8-12,20-22, 27, 28, and 31 with alveoloplasty and bilateral mandibular tori reductions;  Surgeon: Lenn Cal, DDS;  Location: [facility];  Service: Oral Surgery;  Laterality: N/A;     SOCIAL HISTORY:  Social History   Socioeconomic History  . Marital status: Single    Spouse name: Not on file  . Number of children: 2  . Years of education: Not on file  . Highest education level: Not on file  Occupational History  . Occupation: Research scientist (life sciences)  Social Needs  . Financial resource strain: Not on file  . Food insecurity    Worry: Not on file    Inability: Not on file  . Transportation needs    Medical: Not on file    Non-medical: Not on file  Tobacco Use  . Smoking status: Current Every Day Smoker    Packs/day: 1.00    Years: 50.00    Pack years: 50.00    Types: Cigarettes    Start date: 06/16/1965  . Smokeless tobacco: Former Systems developer    Types: [place] date: 08/04/2005  Substance and Sexual Activity  . Alcohol use: No    Alcohol/week: 0.0 standard drinks  . Drug use: Yes    Types: Marijuana    Comment: THC for appetite; occ  . Sexual activity: Not on file  Lifestyle  . Physical activity    Days per week: Not on file    Minutes per session: Not on file  . Stress: Not on file  Relationships  . Social Herbalist on phone: Not on file    Gets together: Not on file    Attends religious service: Not on file    Active member of club or organization: Not on file    Attends meetings of clubs or organizations: Not on file     Relationship status: Not on file  . Intimate partner violence    Fear of current or ex partner: Not on file    Emotionally abused: Not on file    Physically abused: Not on file    Forced sexual activity: Not on file  Other Topics Concern  . Not on file  Social History Narrative  . Not on file    FAMILY HISTORY:  Family History  Problem Relation Age of Onset  . Stomach cancer Mother        dx 47s  . Aneurysm Maternal Aunt        brain  . Alcohol abuse Maternal Uncle   . Stomach cancer Maternal Grandmother        dx 50s-60s  . Prostate cancer Maternal Uncle  dx 87s    CURRENT MEDICATIONS:  Outpatient Encounter Medications as of 06/07/2019  Medication Sig  . Calcium Carb-Cholecalciferol (CALCIUM 500 + D3) 500-600 MG-UNIT TABS Take 2 tablets by mouth daily.  . ergocalciferol (VITAMIN D2) 50000 units capsule Take 1 capsule (50,000 Units total) by mouth every Friday.  . fentaNYL (DURAGESIC) 75 MCG/HR Place 1 patch onto the skin every 3 (three) days.  Marland Kitchen HYDROcodone-acetaminophen (NORCO) 10-325 MG tablet Take 1 tablet by mouth every 6 (six) hours as needed.  . lubiprostone (AMITIZA) 24 MCG capsule Take 1 capsule (24 mcg total) by mouth 2 (two) times daily with a meal.  . omeprazole (PRILOSEC) 20 MG capsule Take 1 capsule (20 mg total) by mouth daily.  . predniSONE (DELTASONE) 10 MG tablet Take 1 tablet (10 mg total) by mouth daily.  . tamsulosin (FLOMAX) 0.4 MG CAPS capsule Take 1 capsule (0.4 mg total) by mouth at bedtime.  . temazepam (RESTORIL) 15 MG capsule Take 1 capsule (15 mg total) by mouth at bedtime as needed for sleep.  . Wheat Dextrin (BENEFIBER PO) Take by mouth as needed.  . [DISCONTINUED] HYDROcodone-acetaminophen (NORCO) 10-325 MG tablet Take 1 tablet by mouth every 6 (six) hours as needed.  . chlorhexidine (PERIDEX) 0.12 % solution Rinse with 15 mls twice daily for 30 seconds. Spit out excess. Do not swallow. (Patient not taking: Reported on 06/07/2019)  .  lidocaine (LIDODERM) 5 % Place 1 patch onto the skin daily. Remove & Discard patch within 12 hours or as directed by MD (Patient not taking: Reported on 06/07/2019)  . lidocaine-prilocaine (EMLA) cream Apply a quarter size amount to port site 1 hour prior to chemo. Do not rub in. Cover with plastic wrap. (Patient not taking: Reported on 06/07/2019)  . ondansetron (ZOFRAN) 8 MG tablet TAKE ONE TABLET BY MOUTH EVERY 8 HOURS AS NEEDED FOR NAUSEA OR VOMITING (Patient not taking: Reported on 06/07/2019)  . prochlorperazine (COMPAZINE) 10 MG tablet Take 1 tablet (10 mg total) by mouth every 6 (six) hours as needed for nausea or vomiting.  . traZODone (DESYREL) 100 MG tablet Take 1 tablet (100 mg total) by mouth at bedtime as needed for sleep.  . [DISCONTINUED] prochlorperazine (COMPAZINE) 10 MG tablet Take 1 tablet (10 mg total) by mouth every 6 (six) hours as needed for nausea or vomiting. (Patient not taking: Reported on 06/07/2019)   Facility-Administered Encounter Medications as of 06/07/2019  Medication  . sodium chloride flush (NS) 0.9 % injection 10 mL    ALLERGIES:  Allergies  Allergen Reactions  . Meloxicam Other (See Comments)    Caused stomach bleeding.     PHYSICAL EXAM:  ECOG Performance status: 1  Vitals:   06/07/19 0952  BP: 108/77  Pulse: 96  Resp: 18  Temp: (!) 97.5 F (36.4 C)  SpO2: 99%   Filed Weights   06/07/19 0952  Weight: 237 lb 3.2 oz (107.6 kg)    Physical Exam Constitutional:      Appearance: Normal appearance.  HENT:     Head: Normocephalic.     Right Ear: External ear normal.     Left Ear: External ear normal.     Nose: Nose normal.     Mouth/Throat:     Pharynx: Oropharynx is clear.  Eyes:     Conjunctiva/sclera: Conjunctivae normal.  Neck:     Musculoskeletal: Normal range of motion.  Cardiovascular:     Rate and Rhythm: Normal rate and regular rhythm.     Pulses: Normal  pulses.     Heart sounds: Normal heart sounds.  Pulmonary:     Effort:  Pulmonary effort is normal.     Breath sounds: Normal breath sounds.  Abdominal:     General: Bowel sounds are normal.  Skin:    General: Skin is warm.  Neurological:     General: No focal deficit present.     Mental Status: He is alert. Mental status is at baseline.  Psychiatric:        Mood and Affect: Mood normal.        Behavior: Behavior normal.        Thought Content: Thought content normal.        Judgment: Judgment normal.      LABORATORY DATA:  I have reviewed the labs as listed.  CBC    Component Value Date/Time   WBC 5.7 06/07/2019 0913   RBC 4.21 (L) 06/07/2019 0913   HGB 12.9 (L) 06/07/2019 0913   HCT 40.2 06/07/2019 0913   PLT 232 06/07/2019 0913   MCV 95.5 06/07/2019 0913   MCH 30.6 06/07/2019 0913   MCHC 32.1 06/07/2019 0913   RDW 14.9 06/07/2019 0913   LYMPHSABS 1.1 06/07/2019 0913   MONOABS 0.5 06/07/2019 0913   EOSABS 0.2 06/07/2019 0913   BASOSABS 0.0 06/07/2019 0913   CMP Latest Ref Rng & Units 06/07/2019 05/10/2019 04/12/2019  Glucose 70 - 99 mg/dL 119(H) 122(H) 113(H)  BUN 8 - 23 mg/dL '10 11 10  ' Creatinine 0.61 - 1.24 mg/dL 1.14 0.97 0.97  Sodium 135 - 145 mmol/L 139 138 139  Potassium 3.5 - 5.1 mmol/L 3.7 3.7 3.7  Chloride 98 - 111 mmol/L 104 104 104  CO2 22 - 32 mmol/L '26 27 25  ' Calcium 8.9 - 10.3 mg/dL 9.0 9.2 8.3(L)  Total Protein 6.5 - 8.1 g/dL 6.6 6.6 6.4(L)  Total Bilirubin 0.3 - 1.2 mg/dL 0.4 0.5 0.5  Alkaline Phos 38 - 126 U/L 72 69 67  AST 15 - 41 U/L 13(L) 14(L) 12(L)  ALT 0 - 44 U/L '14 13 13       ' DIAGNOSTIC IMAGING:  I have independently reviewed the scans and discussed with the patient.   I have reviewed Francene Finders, NP's note and agree with the documentation.  I personally performed a face-to-face visit, made revisions and my assessment and plan is as follows.    ASSESSMENT & PLAN:   No problem-specific Assessment & Plan notes found for this encounter.      Orders placed this encounter:  No orders of the  defined types were placed in this encounter.     Derek Jack, MD [place] [phone]

## 2019-06-10 NOTE — Assessment & Plan Note (Signed)
1.  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 -Abiraterone/prednisone from 12/02/2017 through January 2020 with progression. 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  No BRCA related mutation detected. -[name] 22.5 mg on 03/15/2019. - 7 cycles of cabazitaxel from 09/27/2018 through 03/15/2019.  He is taking prednisone 10 mg daily. -CT CAP on 04/08/2019 showed stable exam. - Bone scan on 04/05/2019 showed scattered osseous metastasis, similar in distribution to prior study.  There is grade 2 intensity of uptake at the left anterior iliac lesion compared to prior exam.  No new sites. - PSA is continuously going up.  Last 1 was 50. -We cut back on the dose of cabazitaxel to 15 mg per metered square during last cycle secondary to neuropathy.  His neuropathy improved with dose reduction. -Review of PSA shows concerns for disease progression despite bone scan showing no new lesions.  PSA in January was 7.74, most recently measured 51.36.  We will wait on PSA level today.  Also can consider MSI testing for patient.  Patient may also be a candidate for Xtandi.  If MSI is high, could consider pembrolizumab.  We will discuss these options further at his next visit.  2.  Upper back pain: -Continue fentanyl 75 mcg patch. -Continue hydrocodone 10 mg between 1-3 tablets/day for breakthrough pain.  He gets short of breath if he takes more pills. -Use lidocaine patch without help.  3.  Bone metastasis: - Denosumab on hold since 12/21/2018 as he developed ulcer in the right lower jaw alveolar ridge.

## 2019-06-15 ENCOUNTER — Telehealth (HOSPITAL_COMMUNITY): Payer: Self-pay | Admitting: *Deleted

## 2019-06-15 ENCOUNTER — Other Ambulatory Visit (HOSPITAL_COMMUNITY): Payer: Self-pay | Admitting: Hematology

## 2019-06-15 DIAGNOSIS — C61 Malignant neoplasm of prostate: Secondary | ICD-10-CM

## 2019-06-15 DIAGNOSIS — G893 Neoplasm related pain (acute) (chronic): Secondary | ICD-10-CM

## 2019-06-15 MED ORDER — FENTANYL 75 MCG/HR TD PT72: 1.0000 | MEDICATED_PATCH | TRANSDERMAL | 0 refills | Status: DC

## 2019-06-15 MED ORDER — LEVOFLOXACIN 500 MG PO TABS: 500.0000 mg | ORAL_TABLET | Freq: Every day | ORAL | 0 refills | Status: DC

## 2019-06-15 NOTE — Telephone Encounter (Signed)
Pt called into clinic stating his port site was red,swollen, and warm to the touch. Reynolds Bowl NP, made aware and called antibiotics in for this pt. Call pt to let him know and pt verbalized understanding.

## 2019-06-15 NOTE — Telephone Encounter (Signed)
Pt was also told to her call back to the clinic if he develops a fever. Pt has a regular appointment to be seen at clinic on 06/23/19. Will re-evaluate if needed.

## 2019-06-23 ENCOUNTER — Other Ambulatory Visit (HOSPITAL_COMMUNITY): Payer: Self-pay | Admitting: Surgery

## 2019-06-23 ENCOUNTER — Telehealth (HOSPITAL_COMMUNITY): Payer: Self-pay | Admitting: Surgery

## 2019-06-23 ENCOUNTER — Ambulatory Visit (HOSPITAL_COMMUNITY): Payer: Medicare Other | Admitting: Hematology

## 2019-06-23 ENCOUNTER — Other Ambulatory Visit (HOSPITAL_COMMUNITY): Payer: Medicare Other

## 2019-06-23 MED ORDER — DOCUSATE SODIUM 100 MG PO CAPS: ORAL_CAPSULE | ORAL | 1 refills | Status: AC

## 2019-06-23 NOTE — Telephone Encounter (Signed)
Pt had called this morning stating that he could not come to his appointment today due to lack of transportation.  However, the pt stated that he was constipated and his abdomen was sore from straining.  I notified Dr. Raliegh Ip and ordered the pt colace stool softeners.  I spoke to the pt again to let him know the instructions for the stool softeners, and he stated that his port a cath site was red and swollen with no drainage.  Pt again stated that he could not come in today to have the site evaluated due to lack of transportation.  Lupita Raider aware of the situation, and pt scheduled to see Renee on Monday with labs at 8:30.  I called the pt back and let him know his prescription was called into [facility], and to let him know of his appointment.  I also told the pt to go to the ER if he starts running a fever or has drainage from his port a cath site.  Pt verbalized understanding and was told to call our office back if he had concerns.

## 2019-06-27 ENCOUNTER — Ambulatory Visit (HOSPITAL_COMMUNITY)
Admission: RE | Admit: 2019-06-27 | Discharge: 2019-06-27 | Disposition: A | Discharge status: Home / Self Care | Payer: Medicare Other | Source: Ambulatory Visit | Attending: Hematology | Admitting: Hematology

## 2019-06-27 ENCOUNTER — Encounter: Payer: Self-pay | Admitting: Hematology

## 2019-06-27 ENCOUNTER — Inpatient Hospital Stay (HOSPITAL_COMMUNITY): Payer: Medicare Other

## 2019-06-27 ENCOUNTER — Inpatient Hospital Stay (HOSPITAL_BASED_OUTPATIENT_CLINIC_OR_DEPARTMENT_OTHER): Payer: Medicare Other | Admitting: Hematology

## 2019-06-27 ENCOUNTER — Other Ambulatory Visit: Payer: Self-pay

## 2019-06-27 VITALS — BP 126/82 | HR 97 | Temp 97.3°F | Resp 18 | Wt 236.1 lb

## 2019-06-27 DIAGNOSIS — F17210 Nicotine dependence, cigarettes, uncomplicated: Secondary | ICD-10-CM | POA: Diagnosis not present

## 2019-06-27 DIAGNOSIS — Z800 Family history of malignant neoplasm of digestive organs: Secondary | ICD-10-CM | POA: Diagnosis not present

## 2019-06-27 DIAGNOSIS — C7951 Secondary malignant neoplasm of bone: Secondary | ICD-10-CM | POA: Diagnosis not present

## 2019-06-27 DIAGNOSIS — C61 Malignant neoplasm of prostate: Secondary | ICD-10-CM

## 2019-06-27 DIAGNOSIS — T82828A Fibrosis of vascular prosthetic devices, implants and grafts, initial encounter: Secondary | ICD-10-CM | POA: Diagnosis not present

## 2019-06-27 DIAGNOSIS — Z86718 Personal history of other venous thrombosis and embolism: Secondary | ICD-10-CM | POA: Diagnosis not present

## 2019-06-27 DIAGNOSIS — Z8546 Personal history of malignant neoplasm of prostate: Secondary | ICD-10-CM | POA: Diagnosis not present

## 2019-06-27 DIAGNOSIS — Z5111 Encounter for antineoplastic chemotherapy: Secondary | ICD-10-CM | POA: Diagnosis not present

## 2019-06-27 DIAGNOSIS — M546 Pain in thoracic spine: Secondary | ICD-10-CM | POA: Diagnosis not present

## 2019-06-27 LAB — CBC WITH DIFFERENTIAL/PLATELET
Abs Immature Granulocytes: 0.03 10*3/uL (ref 0.00–0.07)
Basophils Absolute: 0 10*3/uL (ref 0.0–0.1)
Basophils Relative: 0 %
Eosinophils Absolute: 0 10*3/uL (ref 0.0–0.5)
Eosinophils Relative: 0 %
HCT: 39.6 % (ref 39.0–52.0)
Hemoglobin: 12.8 g/dL — ABNORMAL LOW (ref 13.0–17.0)
Immature Granulocytes: 0 %
Lymphocytes Relative: 11 %
Lymphs Abs: 0.8 10*3/uL (ref 0.7–4.0)
MCH: 31 pg (ref 26.0–34.0)
MCHC: 32.3 g/dL (ref 30.0–36.0)
MCV: 95.9 fL (ref 80.0–100.0)
Monocytes Absolute: 0.4 10*3/uL (ref 0.1–1.0)
Monocytes Relative: 5 %
Neutro Abs: 5.7 10*3/uL (ref 1.7–7.7)
Neutrophils Relative %: 84 %
Platelets: 204 10*3/uL (ref 150–400)
RBC: 4.13 MIL/uL — ABNORMAL LOW (ref 4.22–5.81)
RDW: 15.3 % (ref 11.5–15.5)
WBC: 6.9 10*3/uL (ref 4.0–10.5)
nRBC: 0 % (ref 0.0–0.2)

## 2019-06-27 LAB — COMPREHENSIVE METABOLIC PANEL
ALT: 12 U/L (ref 0–44)
AST: 13 U/L — ABNORMAL LOW (ref 15–41)
Albumin: 3.4 g/dL — ABNORMAL LOW (ref 3.5–5.0)
Alkaline Phosphatase: 66 U/L (ref 38–126)
Anion gap: 11 (ref 5–15)
BUN: 11 mg/dL (ref 8–23)
CO2: 22 mmol/L (ref 22–32)
Calcium: 8.6 mg/dL — ABNORMAL LOW (ref 8.9–10.3)
Chloride: 104 mmol/L (ref 98–111)
Creatinine, Ser: 1.06 mg/dL (ref 0.61–1.24)
GFR calc Af Amer: >60 mL/min (ref >60)
GFR calc non Af Amer: >60 mL/min (ref >60)
Glucose, Bld: 132 mg/dL — ABNORMAL HIGH (ref 70–99)
Potassium: 4 mmol/L (ref 3.5–5.1)
Sodium: 137 mmol/L (ref 135–145)
Total Bilirubin: 0.3 mg/dL (ref 0.3–1.2)
Total Protein: 6.6 g/dL (ref 6.5–8.1)

## 2019-06-27 MED ORDER — IOPAMIDOL (ISOVUE-300) INJECTION 61%
50.0000 mL | Freq: Once | INTRAVENOUS | Status: AC | PRN
  Administered 2019-06-27: 10 mL

## 2019-06-27 NOTE — Progress Notes (Signed)
Matthew [name], [place] [address]   CLINIC:  Medical Oncology/Hematology  PCP:  [name], [place] [address] [phone]   REASON FOR VISIT:  Follow-up for Prostate Cancer   CURRENT THERAPY: Jevtana   BRIEF ONCOLOGIC HISTORY:  Oncology History  Prostate carcinoma ([facility])  12/10/2015 Imaging   CTA chest, multiple thoracic and rib osseous lesions. no primary evident. Atherosclerosis including aortic and CAD   12/12/2015 Imaging   CT abdomen/pelvis with sclerotic osseous lesions througout the lumbar spine, sacrum, bilateral iliac bones and L proximal femur worrisome for sclerotic osseous mets,mild prostatomegaly, no LAD, Infrarenal 3.8 cm AAA   12/12/2015 Tumor Marker   PSA 153.85   12/18/2015 Imaging   Bone Scan Diffuse bony metastatic disease, posterior calvarium, sternum, thoracic, lumbar spine, bilateral ribs, L shoulder, bilateral bony pelvis, proximal L femur   12/24/2015 Initial Biopsy   CT biopsy of L iliac bone lesion   12/26/2015 Pathology Results   Bone, biopsy, left iliac - POSITIVE FOR METASTATIC ADENOCARCINOMA.   12/28/2015 - 09/01/2016 Chemotherapy   Firmagon instituted 240 mg    01/01/2016 Procedure   Port placed by IR.   01/07/2016 - 04/11/2016 Chemotherapy   Docetaxel every 21 days x 6 cycles with dose reductions due to tolerance   02/18/2016 Adverse Reaction   GI toxicity from docetaxel, dose reduced to 60 mg/m2   02/18/2016 Treatment Plan Change   Docetaxel dose reduced by 20%   03/06/2016 Procedure   Dr. Enrique Sack- Multiple extraction of tooth numbers 3, 5, 6, 8, 9, 10, 11, 12, 20, 21, 22, 27, 28, and 31. 4 Quadrants of alveoloplasty. Bilateral mandibular lingual tori reductions.   04/09/2016 Imaging   Brain MRI 1. No intracranial metastatic disease 2. No acute intracranial abnormality 3. Findings of chronic microvascular disease   05/14/2016 Imaging   CT C/A/P Stable appearing diffuse bony  metastatic disease. No new significant disease is seen Innumerable patchy sclerotic osseous metastases throughout the axial and proximal appendicular skeleton, stable in size and distribution, significantly increased in density in the interval, likely reflecting treatment effect. 2. No new or progressive metastatic disease in the chest, abdomen or pelvis. 3. Bilateral perifissural pulmonary nodules are stable and probably benign. 4. Aortic atherosclerosis. Infrarenal 4.3 cm abdominal aortic aneurysm, minimally increased in size. Recommend followup by ultrasound in 1 year.   06/02/2016 Tumor Marker   PSA 0.99 ng/ml    - 07/31/2016 Radiation Therapy   Palliative XRT by Dr. Lianne Cure in [place].    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  The Multi-Gene Panel offered by [facility] includes sequencing and/or deletion duplication testing of the following 85 genes: AIP, ALK, APC, ATM, AXIN2,BAP1,  BARD1, BLM, BMPR1A, BRCA1, BRCA2, BRIP1, CASR, CDC73, CDH1, CDK4, CDKN1B, CDKN1C, CDKN2A (p14ARF), CDKN2A (p16INK4a), CEBPA, CHEK2, CTNNA1, DICER1, DIS3L2, EGFR (c.2369C>T, p.Thr790Met variant only), EPCAM (Deletion/duplication testing only), FH, FLCN, GATA2, GPC3, GREM1 (Promoter region deletion/duplication testing only), HOXB13 (c.251G>A, p.Gly84Glu), HRAS, KIT, MAX, MEN1, MET, MITF (c.952G>A, p.Glu318Lys variant only), MLH1, MSH2, MSH3, MSH6, MUTYH, NBN, NF1, NF2, NTHL1, PALB2, PDGFRA, PHOX2B, PMS2, POLD1, POLE, POT1, PRKAR1A, PTCH1, PTEN, RAD50, RAD51C, RAD51D, RB1, RECQL4, RET, RNF43, RUNX1, SDHAF2, SDHA (sequence changes only), SDHB, SDHC, SDHD, SMAD4, SMARCA4, SMARCB1, SMARCE1, STK11, SUFU, TERC, TERT, TMEM127, TP53, TSC1, TSC2, VHL, WRN and WT1.  The report date is  August 24, 2018  This is not thought to increase the risk for colon cancer or other cancers, unless there is  the presence of two pathogenic mutations.   09/27/2018 -  Chemotherapy   The patient had palonosetron (ALOXI) injection 0.25 mg, 0.25 mg, Intravenous,  Once, 10 of 10 cycles Administration: 0.25 mg (09/27/2018), 0.25 mg (10/25/2018), 0.25 mg (11/23/2018), 0.25 mg (12/21/2018), 0.25 mg (01/18/2019), 0.25 mg (02/15/2019), 0.25 mg (03/15/2019), 0.25 mg (04/12/2019), 0.25 mg (05/10/2019), 0.25 mg (06/07/2019) pegfilgrastim (NEULASTA ONPRO KIT) injection 6 mg, 6 mg, Subcutaneous, Once, 9 of 9 cycles Administration: 6 mg (10/25/2018), 6 mg (11/23/2018), 6 mg (12/21/2018), 6 mg (01/18/2019), 6 mg (02/15/2019), 6 mg (03/15/2019), 6 mg (04/12/2019), 6 mg (05/10/2019), 6 mg (06/07/2019) pegfilgrastim-cbqv (UDENYCA) injection 6 mg, 6 mg, Subcutaneous, Once, 1 of 1 cycle Administration: 6 mg (09/29/2018) cabazitaxel (JEVTANA) 35 mg in dextrose 5 % 250 mL chemo infusion, 15 mg/m2 = 35 mg (100 % of original dose 15 mg/m2), Intravenous,  Once, 10 of 10 cycles Dose modification: 15 mg/m2 (original dose 15 mg/m2, Cycle 1, Reason: Provider Judgment), 20 mg/m2 (original dose 15 mg/m2, Cycle 2, Reason: Provider Judgment), 20 mg/m2 (original dose 20 mg/m2, Cycle 5, Reason: Patient Age, Comment: Change in jevtana orderable), 15 mg/m2 (75 % of original dose 20 mg/m2, Cycle 8, Reason: Other (see comments), Comment: nausea, worsening numbness) Administration: 35 mg (09/27/2018), 46 mg (10/25/2018), 46 mg (11/23/2018), 46 mg (12/21/2018), 46 mg (01/18/2019), 46 mg (02/15/2019), 46 mg (03/15/2019), 35 mg (04/12/2019), 35 mg (05/10/2019), 35 mg (06/07/2019)  for chemotherapy treatment.    Bone metastases ([facility])  12/26/2015 Initial Diagnosis   Bone metastases ([facility])   09/27/2018 -  Chemotherapy   The patient had palonosetron (ALOXI) injection 0.25 mg, 0.25 mg, Intravenous,  Once, 4 of 6 cycles Administration: 0.25 mg (09/27/2018), 0.25 mg (10/25/2018), 0.25 mg (11/23/2018), 0.25 mg (12/21/2018) pegfilgrastim (NEULASTA ONPRO KIT) injection 6 mg, 6 mg, Subcutaneous,  Once, 3 of 5 cycles Administration: 6 mg (10/25/2018), 6 mg (11/23/2018), 6 mg (12/21/2018) pegfilgrastim-cbqv (UDENYCA) injection 6 mg, 6 mg, Subcutaneous, Once, 1 of 1 cycle Administration: 6 mg (09/29/2018) cabazitaxel (JEVTANA) 35 mg in dextrose 5 % 250 mL chemo infusion, 15 mg/m2 = 35 mg (100 % of original dose 15 mg/m2), Intravenous,  Once, 4 of 6 cycles Dose modification: 15 mg/m2 (original dose 15 mg/m2, Cycle 1, Reason: Provider Judgment), 20 mg/m2 (original dose 15 mg/m2, Cycle 2, Reason: Provider Judgment) Administration: 35 mg (09/27/2018), 46 mg (10/25/2018), 46 mg (11/23/2018), 46 mg (12/21/2018)  for chemotherapy treatment.       CANCER STAGING: Cancer Staging Prostate carcinoma [facility]) Staging form: Prostate, AJCC 7th Edition - Clinical stage from 12/24/2015: Stage IV (TX, N0, M1b, PSA: 20 or greater) - Signed by Baird Cancer, PA-C on 01/07/2016    INTERVAL HISTORY:  Matthew [name] 63 y.o. male presents today for follow-up.  He reports overall doing well.  He states he is feeling better than he has in a long time.  He reports redness and heat associated with his port site.  He was placed on antibiotics and states area has improved.  He continues with reports of chronic pain.  He is here for repeat labs and office visit.   REVIEW OF SYSTEMS:  Review of Systems  Constitutional: Positive for fatigue.  HENT:  Negative.   Eyes: Negative.   Respiratory: Negative.   Cardiovascular: Negative.   Gastrointestinal: Positive for constipation.  Genitourinary: Negative.    Musculoskeletal: Positive for arthralgias,  back pain and myalgias.  Skin: Negative.   Neurological: Positive for extremity weakness.  Hematological: Negative.   Psychiatric/Behavioral: Negative.      PAST MEDICAL/SURGICAL HISTORY:  Past Medical History:  Diagnosis Date  . Arthritis    Left knee  . Bone cancer ([facility])   . Bone metastases ([facility]) 12/26/2015  . Cancer [facility])    Prostate  . DVT (deep venous thrombosis)  ([facility]) 2004  . Family history of prostate cancer   . Family history of stomach cancer   . Kidney stones    ~2002  . Prostate carcinoma ([facility]) 12/26/2015   Past Surgical History:  Procedure Laterality Date  . CIRCUMCISION  30 years ago  . MULTIPLE EXTRACTIONS WITH ALVEOLOPLASTY N/A 03/06/2016   Procedure: Extraction of tooth #'s 3,5,6,8-12,20-22, 27, 28, and 31 with alveoloplasty and bilateral mandibular tori reductions;  Surgeon: Lenn Cal, DDS;  Location: [facility];  Service: Oral Surgery;  Laterality: N/A;     SOCIAL HISTORY:  Social History   Socioeconomic History  . Marital status: Single    Spouse name: Not on file  . Number of children: 2  . Years of education: Not on file  . Highest education level: Not on file  Occupational History  . Occupation: Research scientist (life sciences)  Social Needs  . Financial resource strain: Not on file  . Food insecurity    Worry: Not on file    Inability: Not on file  . Transportation needs    Medical: Not on file    Non-medical: Not on file  Tobacco Use  . Smoking status: Current Every Day Smoker    Packs/day: 1.00    Years: 50.00    Pack years: 50.00    Types: Cigarettes    Start date: 06/16/1965  . Smokeless tobacco: Former Systems developer    Types: [place] date: 08/04/2005  Substance and Sexual Activity  . Alcohol use: No    Alcohol/week: 0.0 standard drinks  . Drug use: Yes    Types: Marijuana    Comment: THC for appetite; occ  . Sexual activity: Not on file  Lifestyle  . Physical activity    Days per week: Not on file    Minutes per session: Not on file  . Stress: Not on file  Relationships  . Social Herbalist on phone: Not on file    Gets together: Not on file    Attends religious service: Not on file    Active member of club or organization: Not on file    Attends meetings of clubs or organizations: Not on file    Relationship status: Not on file  . Intimate partner violence    Fear of current or ex partner: Not  on file    Emotionally abused: Not on file    Physically abused: Not on file    Forced sexual activity: Not on file  Other Topics Concern  . Not on file  Social History Narrative  . Not on file    FAMILY HISTORY:  Family History  Problem Relation Age of Onset  . Stomach cancer Mother        dx 28s  . Aneurysm Maternal Aunt        brain  . Alcohol abuse Maternal Uncle   . Stomach cancer Maternal Grandmother        dx 50s-60s  . Prostate cancer Maternal Uncle        dx 75s    CURRENT MEDICATIONS:  Outpatient Encounter Medications as of 06/27/2019  Medication Sig  . Calcium Carb-Cholecalciferol (CALCIUM 500 + D3) 500-600 MG-UNIT TABS Take 2 tablets by mouth daily.  . chlorhexidine (PERIDEX) 0.12 % solution Rinse with 15 mls twice daily for 30 seconds. Spit out excess. Do not swallow.  . docusate sodium (COLACE) 100 MG capsule Take 2 capsules daily to help soften the stool.  It this does not work, you can increase to 2 capsules in the morning and 2 capsules in the evening.  If you do not have a bowel movement please call the [facility].  . ergocalciferol (VITAMIN D2) 50000 units capsule Take 1 capsule (50,000 Units total) by mouth every Friday.  . fentaNYL (DURAGESIC) 75 MCG/HR Place 1 patch onto the skin every 3 (three) days.  Marland Kitchen HYDROcodone-acetaminophen (NORCO) 10-325 MG tablet Take 1 tablet by mouth every 6 (six) hours as needed.  . lubiprostone (AMITIZA) 24 MCG capsule Take 1 capsule (24 mcg total) by mouth 2 (two) times daily with a meal.  . omeprazole (PRILOSEC) 20 MG capsule Take 1 capsule (20 mg total) by mouth daily.  . predniSONE (DELTASONE) 10 MG tablet Take 1 tablet (10 mg total) by mouth daily.  . tamsulosin (FLOMAX) 0.4 MG CAPS capsule Take 1 capsule (0.4 mg total) by mouth at bedtime.  . Wheat Dextrin (BENEFIBER PO) Take by mouth as needed.  . lidocaine (LIDODERM) 5 % Place 1 patch onto the skin daily. Remove & Discard patch within 12 hours or as  directed by MD (Patient not taking: Reported on 06/07/2019)  . lidocaine-prilocaine (EMLA) cream Apply a quarter size amount to port site 1 hour prior to chemo. Do not rub in. Cover with plastic wrap. (Patient not taking: Reported on 06/07/2019)  . ondansetron (ZOFRAN) 8 MG tablet TAKE ONE TABLET BY MOUTH EVERY 8 HOURS AS NEEDED FOR NAUSEA OR VOMITING (Patient not taking: Reported on 06/07/2019)  . prochlorperazine (COMPAZINE) 10 MG tablet Take 1 tablet (10 mg total) by mouth every 6 (six) hours as needed for nausea or vomiting. (Patient not taking: Reported on 06/27/2019)  . temazepam (RESTORIL) 15 MG capsule Take 1 capsule (15 mg total) by mouth at bedtime as needed for sleep. (Patient not taking: Reported on 06/27/2019)  . traZODone (DESYREL) 100 MG tablet Take 1 tablet (100 mg total) by mouth at bedtime as needed for sleep. (Patient not taking: Reported on 06/27/2019)  . [DISCONTINUED] levofloxacin (LEVAQUIN) 500 MG tablet Take 1 tablet (500 mg total) by mouth daily.   Facility-Administered Encounter Medications as of 06/27/2019  Medication  . sodium chloride flush (NS) 0.9 % injection 10 mL    ALLERGIES:  Allergies  Allergen Reactions  . Meloxicam Other (See Comments)    Caused stomach bleeding.     PHYSICAL EXAM:  ECOG Performance status: 1  Vitals:   06/27/19 0843  BP: 126/82  Pulse: 97  Resp: 18  Temp: (!) 97.3 F (36.3 C)  SpO2: 97%   Filed Weights   06/27/19 0843  Weight: 236 lb 1.6 oz (107.1 kg)    Physical Exam Constitutional:      Appearance: Normal appearance.  HENT:     Head: Normocephalic.     Right Ear: External ear normal.     Left Ear: External ear normal.     Nose: Nose normal.     Mouth/Throat:     Mouth: Mucous membranes are moist.     Pharynx: Oropharynx is clear.  Neck:     Musculoskeletal:  Normal range of motion.  Cardiovascular:     Rate and Rhythm: Normal rate and regular rhythm.     Pulses: Normal pulses.     Heart sounds: Normal heart  sounds.  Pulmonary:     Effort: Pulmonary effort is normal.     Breath sounds: Normal breath sounds.  Abdominal:     General: Bowel sounds are normal.  Skin:    General: Skin is warm.  Neurological:     General: No focal deficit present.     Mental Status: He is alert and oriented to person, place, and time.  Psychiatric:        Mood and Affect: Mood normal.        Behavior: Behavior normal.        Thought Content: Thought content normal.        Judgment: Judgment normal.      LABORATORY DATA:  I have reviewed the labs as listed.  CBC    Component Value Date/Time   WBC 6.9 06/27/2019 0823   RBC 4.13 (L) 06/27/2019 0823   HGB 12.8 (L) 06/27/2019 0823   HCT 39.6 06/27/2019 0823   PLT 204 06/27/2019 0823   MCV 95.9 06/27/2019 0823   MCH 31.0 06/27/2019 0823   MCHC 32.3 06/27/2019 0823   RDW 15.3 06/27/2019 0823   LYMPHSABS 0.8 06/27/2019 0823   MONOABS 0.4 06/27/2019 0823   EOSABS 0.0 06/27/2019 0823   BASOSABS 0.0 06/27/2019 0823   CMP Latest Ref Rng & Units 06/27/2019 06/07/2019 05/10/2019  Glucose 70 - 99 mg/dL 132(H) 119(H) 122(H)  BUN 8 - 23 mg/dL '11 10 11  ' Creatinine 0.61 - 1.24 mg/dL 1.06 1.14 0.97  Sodium 135 - 145 mmol/L 137 139 138  Potassium 3.5 - 5.1 mmol/L 4.0 3.7 3.7  Chloride 98 - 111 mmol/L 104 104 104  CO2 22 - 32 mmol/L '22 26 27  ' Calcium 8.9 - 10.3 mg/dL 8.6(L) 9.0 9.2  Total Protein 6.5 - 8.1 g/dL 6.6 6.6 6.6  Total Bilirubin 0.3 - 1.2 mg/dL 0.3 0.4 0.5  Alkaline Phos 38 - 126 U/L 66 72 69  AST 15 - 41 U/L 13(L) 13(L) 14(L)  ALT 0 - 44 U/L '12 14 13          ' ASSESSMENT & PLAN:   Prostate carcinoma ([facility]) 1.  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 -Abiraterone/prednisone from 12/02/2017 through January 2020 with progression. 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  No BRCA related mutation detected. -[name] 22.5  mg on 03/15/2019. - 7 cycles of cabazitaxel from 09/27/2018 through 03/15/2019.  He is taking prednisone 10 mg daily. -CT CAP on 04/08/2019 showed stable exam. - Bone scan on 04/05/2019 showed scattered osseous metastasis, similar in distribution to prior study.  There is grade 2 intensity of uptake at the left anterior iliac lesion compared to prior exam.  No new sites. - PSA is continuously going up.  Last 1 was 50. -We cut back on the dose of cabazitaxel to 15 mg per metered square during last cycle secondary to neuropathy.  His neuropathy improved with dose reduction. -Review of PSA shows concerns for disease progression despite bone scan showing no new lesions.  PSA in January was 7.74, most recently measured 67.10.  -Genetic testing in January 2020 revealed 1 pathogenic mutation in a gene called MUTYH (also known as MYH). -Today we will proceed with guardant 360 testing and  nuc med bone scan. -Also can consider MSI testing for patient.  Patient may also be a candidate for Xtandi.  If MSI is high, could consider pembrolizumab.  We will discuss these options further at his next visit.  2.  Upper back pain: -Continue fentanyl 75 mcg patch. -Continue hydrocodone 10 mg between 1-3 tablets/day for breakthrough pain.  He gets short of breath if he takes more pills. -Use lidocaine patch without help.  3.  Bone metastasis: - Denosumab on hold since 12/21/2018 as he developed ulcer in the right lower jaw alveolar ridge.  4.  Possible Port-A-Cath infection. -On today's exam, Port-A-Cath site was erythematous, swollen, warm to touch.  Patient was sent for a central line study, which revealed fibrin sheath at the tip of catheter.  Blood flow was not obstructed.  -Patient ultimately treated for possible infection and started on Keflex 500 mg twice daily.       Orders placed this encounter:  Orders Placed This Encounter  Procedures  . NM Bone Scan Whole Body  . DG CV Line Injection      [facility] [phone]

## 2019-06-27 NOTE — Assessment & Plan Note (Addendum)
1.  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 -Abiraterone/prednisone from 12/02/2017 through January 2020 with progression. 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  No BRCA related mutation detected. -[name] 22.5 mg on 03/15/2019. - 7 cycles of cabazitaxel from 09/27/2018 through 03/15/2019.  He is taking prednisone 10 mg daily. -CT CAP on 04/08/2019 showed stable exam. - Bone scan on 04/05/2019 showed scattered osseous metastasis, similar in distribution to prior study.  There is grade 2 intensity of uptake at the left anterior iliac lesion compared to prior exam.  No new sites. - PSA is continuously going up.  Last 1 was 50. -We cut back on the dose of cabazitaxel to 15 mg per metered square during last cycle secondary to neuropathy.  His neuropathy improved with dose reduction. -Review of PSA shows concerns for disease progression despite bone scan showing no new lesions.  PSA in January was 7.74, most recently measured 67.10.  -Genetic testing in January 2020 revealed 1 pathogenic mutation in a gene called MUTYH (also known as MYH). -Today we will proceed with guardant 360 testing and nuc med bone scan. -Also can consider MSI testing for patient.  Patient may also be a candidate for Xtandi.  If MSI is high, could consider pembrolizumab.  We will discuss these options further at his next visit.  2.  Upper back pain: -Continue fentanyl 75 mcg patch. -Continue hydrocodone 10 mg between 1-3 tablets/day for breakthrough pain.  He gets short of breath if he takes more pills. -Use lidocaine patch without help.  3.  Bone metastasis: - Denosumab on hold since 12/21/2018 as he developed ulcer in the right lower jaw alveolar ridge.  4.  Possible Port-A-Cath infection. -On today's exam, Port-A-Cath site was erythematous, swollen, warm to touch.  Patient was sent for a central line  study, which revealed fibrin sheath at the tip of catheter.  Blood flow was not obstructed.  -Patient ultimately treated for possible infection and started on Keflex 500 mg twice daily.

## 2019-07-04 ENCOUNTER — Ambulatory Visit (HOSPITAL_COMMUNITY)
Admission: RE | Admit: 2019-07-04 | Discharge: 2019-07-04 | Disposition: A | Discharge status: Home / Self Care | Payer: Medicare Other | Source: Ambulatory Visit | Attending: Hematology | Admitting: Hematology

## 2019-07-04 ENCOUNTER — Other Ambulatory Visit: Payer: Self-pay

## 2019-07-04 DIAGNOSIS — C7951 Secondary malignant neoplasm of bone: Secondary | ICD-10-CM | POA: Diagnosis not present

## 2019-07-04 DIAGNOSIS — C61 Malignant neoplasm of prostate: Secondary | ICD-10-CM | POA: Diagnosis not present

## 2019-07-04 MED ORDER — TECHNETIUM TC 99M MEDRONATE IV KIT
20.0000 | PACK | Freq: Once | INTRAVENOUS | Status: AC | PRN
  Administered 2019-07-04: 10:00:00 20.3 via INTRAVENOUS

## 2019-07-06 ENCOUNTER — Encounter (HOSPITAL_COMMUNITY): Payer: Self-pay | Admitting: Hematology

## 2019-07-06 ENCOUNTER — Other Ambulatory Visit: Payer: Self-pay

## 2019-07-06 ENCOUNTER — Inpatient Hospital Stay (HOSPITAL_COMMUNITY): Payer: Medicare Other | Attending: Hematology | Admitting: Hematology

## 2019-07-06 VITALS — BP 121/75 | HR 115 | Temp 97.1°F | Resp 20 | Wt 235.4 lb

## 2019-07-06 DIAGNOSIS — Z86718 Personal history of other venous thrombosis and embolism: Secondary | ICD-10-CM | POA: Diagnosis not present

## 2019-07-06 DIAGNOSIS — Z8042 Family history of malignant neoplasm of prostate: Secondary | ICD-10-CM | POA: Insufficient documentation

## 2019-07-06 DIAGNOSIS — M25552 Pain in left hip: Secondary | ICD-10-CM | POA: Diagnosis not present

## 2019-07-06 DIAGNOSIS — F17210 Nicotine dependence, cigarettes, uncomplicated: Secondary | ICD-10-CM | POA: Insufficient documentation

## 2019-07-06 DIAGNOSIS — M549 Dorsalgia, unspecified: Secondary | ICD-10-CM | POA: Diagnosis not present

## 2019-07-06 DIAGNOSIS — G893 Neoplasm related pain (acute) (chronic): Secondary | ICD-10-CM | POA: Diagnosis not present

## 2019-07-06 DIAGNOSIS — Z800 Family history of malignant neoplasm of digestive organs: Secondary | ICD-10-CM | POA: Diagnosis not present

## 2019-07-06 DIAGNOSIS — C61 Malignant neoplasm of prostate: Secondary | ICD-10-CM | POA: Insufficient documentation

## 2019-07-06 DIAGNOSIS — C7951 Secondary malignant neoplasm of bone: Secondary | ICD-10-CM | POA: Diagnosis not present

## 2019-07-06 MED ORDER — HYDROCODONE-ACETAMINOPHEN 10-325 MG PO TABS: 1.0000 | ORAL_TABLET | Freq: Four times a day (QID) | ORAL | 0 refills | Status: DC | PRN

## 2019-07-06 MED ORDER — SULFAMETHOXAZOLE-TRIMETHOPRIM 800-160 MG PO TABS: 1.0000 | ORAL_TABLET | Freq: Two times a day (BID) | ORAL | 0 refills | Status: DC

## 2019-07-06 NOTE — Progress Notes (Signed)
[address], [name] [address]   CLINIC:  Medical Oncology/Hematology  PCP:  [name], [place] [address] [phone]   REASON FOR VISIT:  Follow-up for prostate cancer   BRIEF ONCOLOGIC HISTORY:  Oncology History  Prostate carcinoma ([place])  12/10/2015 Imaging   CTA chest, multiple thoracic and rib osseous lesions. no primary evident. Atherosclerosis including aortic and CAD   12/12/2015 Imaging   CT abdomen/pelvis with sclerotic osseous lesions througout the lumbar spine, sacrum, bilateral iliac bones and L proximal femur worrisome for sclerotic osseous mets,mild prostatomegaly, no LAD, Infrarenal 3.8 cm AAA   12/12/2015 Tumor Marker   PSA 153.85   12/18/2015 Imaging   Bone Scan Diffuse bony metastatic disease, posterior calvarium, sternum, thoracic, lumbar spine, bilateral ribs, L shoulder, bilateral bony pelvis, proximal L femur   12/24/2015 Initial Biopsy   CT biopsy of L iliac bone lesion   12/26/2015 Pathology Results   Bone, biopsy, left iliac - POSITIVE FOR METASTATIC ADENOCARCINOMA.   12/28/2015 - 09/01/2016 Chemotherapy   Firmagon instituted 240 mg    01/01/2016 Procedure   Port placed by IR.   01/07/2016 - 04/11/2016 Chemotherapy   Docetaxel every 21 days x 6 cycles with dose reductions due to tolerance   02/18/2016 Adverse Reaction   GI toxicity from docetaxel, dose reduced to 60 mg/m2   02/18/2016 Treatment Plan Change   Docetaxel dose reduced by 20%   03/06/2016 Procedure   Dr. Enrique Sack- Multiple extraction of tooth numbers 3, 5, 6, 8, 9, 10, 11, 12, 20, 21, 22, 27, 28, and 31. 4 Quadrants of alveoloplasty. Bilateral mandibular lingual tori reductions.   04/09/2016 Imaging   Brain MRI 1. No intracranial metastatic disease 2. No acute intracranial abnormality 3. Findings of chronic microvascular disease   05/14/2016 Imaging   CT C/A/P Stable appearing diffuse bony metastatic disease. No new  significant disease is seen Innumerable patchy sclerotic osseous metastases throughout the axial and proximal appendicular skeleton, stable in size and distribution, significantly increased in density in the interval, likely reflecting treatment effect. 2. No new or progressive metastatic disease in the chest, abdomen or pelvis. 3. Bilateral perifissural pulmonary nodules are stable and probably benign. 4. Aortic atherosclerosis. Infrarenal 4.3 cm abdominal aortic aneurysm, minimally increased in size. Recommend followup by ultrasound in 1 year.   06/02/2016 Tumor Marker   PSA 0.99 ng/ml    - 07/31/2016 Radiation Therapy   Palliative XRT by Dr. Lianne Cure in [place].    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  The Multi-Gene Panel offered by [facility] includes sequencing and/or deletion duplication testing of the following 85 genes: AIP, ALK, APC, ATM, AXIN2,BAP1,  BARD1, BLM, BMPR1A, BRCA1, BRCA2, BRIP1, CASR, CDC73, CDH1, CDK4, CDKN1B, CDKN1C, CDKN2A (p14ARF), CDKN2A (p16INK4a), CEBPA, CHEK2, CTNNA1, DICER1, DIS3L2, EGFR (c.2369C>T, p.Thr790Met variant only), EPCAM (Deletion/duplication testing only), FH, FLCN, GATA2, GPC3, GREM1 (Promoter region deletion/duplication testing only), HOXB13 (c.251G>A, p.Gly84Glu), HRAS, KIT, MAX, MEN1, MET, MITF (c.952G>A, p.Glu318Lys variant only), MLH1, MSH2, MSH3, MSH6, MUTYH, NBN, NF1, NF2, NTHL1, PALB2, PDGFRA, PHOX2B, PMS2, POLD1, POLE, POT1, PRKAR1A, PTCH1, PTEN, RAD50, RAD51C, RAD51D, RB1, RECQL4, RET, RNF43, RUNX1, SDHAF2, SDHA (sequence changes only), SDHB, SDHC, SDHD, SMAD4, SMARCA4, SMARCB1, SMARCE1, STK11, SUFU, TERC, TERT, TMEM127, TP53, TSC1, TSC2, VHL, WRN and WT1.  The report date is August 24, 2018  This  is not thought to increase the risk for colon cancer or other cancers, unless there is the presence of two  pathogenic mutations.   09/27/2018 -  Chemotherapy   The patient had palonosetron (ALOXI) injection 0.25 mg, 0.25 mg, Intravenous,  Once, 10 of 10 cycles Administration: 0.25 mg (09/27/2018), 0.25 mg (10/25/2018), 0.25 mg (11/23/2018), 0.25 mg (12/21/2018), 0.25 mg (01/18/2019), 0.25 mg (02/15/2019), 0.25 mg (03/15/2019), 0.25 mg (04/12/2019), 0.25 mg (05/10/2019), 0.25 mg (06/07/2019) pegfilgrastim (NEULASTA ONPRO KIT) injection 6 mg, 6 mg, Subcutaneous, Once, 9 of 9 cycles Administration: 6 mg (10/25/2018), 6 mg (11/23/2018), 6 mg (12/21/2018), 6 mg (01/18/2019), 6 mg (02/15/2019), 6 mg (03/15/2019), 6 mg (04/12/2019), 6 mg (05/10/2019), 6 mg (06/07/2019) pegfilgrastim-cbqv (UDENYCA) injection 6 mg, 6 mg, Subcutaneous, Once, 1 of 1 cycle Administration: 6 mg (09/29/2018) cabazitaxel (JEVTANA) 35 mg in dextrose 5 % 250 mL chemo infusion, 15 mg/m2 = 35 mg (100 % of original dose 15 mg/m2), Intravenous,  Once, 10 of 10 cycles Dose modification: 15 mg/m2 (original dose 15 mg/m2, Cycle 1, Reason: Provider Judgment), 20 mg/m2 (original dose 15 mg/m2, Cycle 2, Reason: Provider Judgment), 20 mg/m2 (original dose 20 mg/m2, Cycle 5, Reason: Patient Age, Comment: Change in jevtana orderable), 15 mg/m2 (75 % of original dose 20 mg/m2, Cycle 8, Reason: Other (see comments), Comment: nausea, worsening numbness) Administration: 35 mg (09/27/2018), 46 mg (10/25/2018), 46 mg (11/23/2018), 46 mg (12/21/2018), 46 mg (01/18/2019), 46 mg (02/15/2019), 46 mg (03/15/2019), 35 mg (04/12/2019), 35 mg (05/10/2019), 35 mg (06/07/2019)  for chemotherapy treatment.    Bone metastases ([place])  12/26/2015 Initial Diagnosis   Bone metastases ([place])   09/27/2018 -  Chemotherapy   The patient had palonosetron (ALOXI) injection 0.25 mg, 0.25 mg, Intravenous,  Once, 4 of 6 cycles Administration: 0.25 mg (09/27/2018), 0.25 mg (10/25/2018), 0.25 mg (11/23/2018), 0.25 mg (12/21/2018) pegfilgrastim (NEULASTA ONPRO KIT) injection 6 mg, 6 mg, Subcutaneous, Once, 3 of 5 cycles  Administration: 6 mg (10/25/2018), 6 mg (11/23/2018), 6 mg (12/21/2018) pegfilgrastim-cbqv (UDENYCA) injection 6 mg, 6 mg, Subcutaneous, Once, 1 of 1 cycle Administration: 6 mg (09/29/2018) cabazitaxel (JEVTANA) 35 mg in dextrose 5 % 250 mL chemo infusion, 15 mg/m2 = 35 mg (100 % of original dose 15 mg/m2), Intravenous,  Once, 4 of 6 cycles Dose modification: 15 mg/m2 (original dose 15 mg/m2, Cycle 1, Reason: Provider Judgment), 20 mg/m2 (original dose 15 mg/m2, Cycle 2, Reason: Provider Judgment) Administration: 35 mg (09/27/2018), 46 mg (10/25/2018), 46 mg (11/23/2018), 46 mg (12/21/2018)  for chemotherapy treatment.       CANCER STAGING: Cancer Staging Prostate carcinoma [facility]) Staging form: Prostate, AJCC 7th Edition - Clinical stage from 12/24/2015: Stage IV (TX, N0, M1b, PSA: 20 or greater) - Signed by Baird Cancer, PA-C on 01/07/2016    INTERVAL HISTORY:  Mr. Baltimore 63 y.o. male seen for follow-up of metastatic prostate cancer to the bones.  Complains of low back and left hip and left knee pains.  Constipation has been stable.  Appetite is 50%.  Energy levels are 25%.  Chronic cough and shortness of breath are also stable.  Last [name] injection was on 06/07/2019.  Denies any weight loss.  He is taking hydrocodone 3 to 4 tablets/day for breakthrough pain.  This is an increase from his baseline of 1 to 2 tablets/day.  Numbness in the toes has been stable.  REVIEW OF SYSTEMS:  Review of Systems  Respiratory: Positive for shortness of breath.   Gastrointestinal: Positive for constipation.  Musculoskeletal: Positive  for back pain.  Neurological: Positive for numbness.  All other systems reviewed and are negative.    PAST MEDICAL/SURGICAL HISTORY:  Past Medical History:  Diagnosis Date  . Arthritis    Left knee  . Bone cancer ([place])   . Bone metastases ([place]) 12/26/2015  . Cancer [facility])    Prostate  . DVT (deep venous thrombosis) ([place]) 2004  . Family history of prostate cancer   .  Family history of stomach cancer   . Kidney stones    ~2002  . Prostate carcinoma ([place]) 12/26/2015   Past Surgical History:  Procedure Laterality Date  . CIRCUMCISION  30 years ago  . MULTIPLE EXTRACTIONS WITH ALVEOLOPLASTY N/A 03/06/2016   Procedure: Extraction of tooth #'s 3,5,6,8-12,20-22, 27, 28, and 31 with alveoloplasty and bilateral mandibular tori reductions;  Surgeon: Lenn Cal, DDS;  Location: [place];  Service: Oral Surgery;  Laterality: N/A;     SOCIAL HISTORY:  Social History   Socioeconomic History  . Marital status: Single    Spouse name: Not on file  . Number of children: 2  . Years of education: Not on file  . Highest education level: Not on file  Occupational History  . Occupation: Research scientist (life sciences)  Social Needs  . Financial resource strain: Not on file  . Food insecurity    Worry: Not on file    Inability: Not on file  . Transportation needs    Medical: Not on file    Non-medical: Not on file  Tobacco Use  . Smoking status: Current Every Day Smoker    Packs/day: 1.00    Years: 50.00    Pack years: 50.00    Types: Cigarettes    Start date: 06/16/1965  . Smokeless tobacco: Former Systems developer    Types: [place] date: 08/04/2005  Substance and Sexual Activity  . Alcohol use: No    Alcohol/week: 0.0 standard drinks  . Drug use: Yes    Types: Marijuana    Comment: THC for appetite; occ  . Sexual activity: Not on file  Lifestyle  . Physical activity    Days per week: Not on file    Minutes per session: Not on file  . Stress: Not on file  Relationships  . Social Herbalist on phone: Not on file    Gets together: Not on file    Attends religious service: Not on file    Active member of club or organization: Not on file    Attends meetings of clubs or organizations: Not on file    Relationship status: Not on file  . Intimate partner violence    Fear of current or ex partner: Not on file    Emotionally abused: Not on file     Physically abused: Not on file    Forced sexual activity: Not on file  Other Topics Concern  . Not on file  Social History Narrative  . Not on file    FAMILY HISTORY:  Family History  Problem Relation Age of Onset  . Stomach cancer Mother        dx 53s  . Aneurysm Maternal Aunt        brain  . Alcohol abuse Maternal Uncle   . Stomach cancer Maternal Grandmother        dx 50s-60s  . Prostate cancer Maternal Uncle        dx 32s    CURRENT MEDICATIONS:  Outpatient Encounter Medications as of 07/06/2019  Medication Sig  . Calcium Carb-Cholecalciferol (CALCIUM 500 + D3) 500-600 MG-UNIT TABS Take 2 tablets by mouth daily.  Marland Kitchen docusate sodium (COLACE) 100 MG capsule Take 2 capsules daily to help soften the stool.  It this does not work, you can increase to 2 capsules in the morning and 2 capsules in the evening.  If you do not have a bowel movement please call the [facility].  . ergocalciferol (VITAMIN D2) 50000 units capsule Take 1 capsule (50,000 Units total) by mouth every Friday.  . fentaNYL (DURAGESIC) 75 MCG/HR Place 1 patch onto the skin every 3 (three) days.  Marland Kitchen HYDROcodone-acetaminophen (NORCO) 10-325 MG tablet Take 1 tablet by mouth every 6 (six) hours as needed.  . lubiprostone (AMITIZA) 24 MCG capsule Take 1 capsule (24 mcg total) by mouth 2 (two) times daily with a meal.  . omeprazole (PRILOSEC) 20 MG capsule Take 1 capsule (20 mg total) by mouth daily.  . ondansetron (ZOFRAN) 8 MG tablet TAKE ONE TABLET BY MOUTH EVERY 8 HOURS AS NEEDED FOR NAUSEA OR VOMITING  . predniSONE (DELTASONE) 10 MG tablet Take 1 tablet (10 mg total) by mouth daily.  . tamsulosin (FLOMAX) 0.4 MG CAPS capsule Take 1 capsule (0.4 mg total) by mouth at bedtime.  . Wheat Dextrin (BENEFIBER PO) Take by mouth as needed.  . [DISCONTINUED] HYDROcodone-acetaminophen (NORCO) 10-325 MG tablet Take 1 tablet by mouth every 6 (six) hours as needed.  . [DISCONTINUED] lidocaine (LIDODERM) 5 % Place 1  patch onto the skin daily. Remove & Discard patch within 12 hours or as directed by MD  . [DISCONTINUED] temazepam (RESTORIL) 15 MG capsule Take 1 capsule (15 mg total) by mouth at bedtime as needed for sleep.  . chlorhexidine (PERIDEX) 0.12 % solution Rinse with 15 mls twice daily for 30 seconds. Spit out excess. Do not swallow. (Patient not taking: Reported on 07/06/2019)  . lidocaine-prilocaine (EMLA) cream Apply a quarter size amount to port site 1 hour prior to chemo. Do not rub in. Cover with plastic wrap. (Patient not taking: Reported on 06/07/2019)  . prochlorperazine (COMPAZINE) 10 MG tablet Take 1 tablet (10 mg total) by mouth every 6 (six) hours as needed for nausea or vomiting. (Patient not taking: Reported on 06/27/2019)  . sulfamethoxazole-trimethoprim (BACTRIM DS) 800-160 MG tablet Take 1 tablet by mouth 2 (two) times daily.  . traZODone (DESYREL) 100 MG tablet Take 1 tablet (100 mg total) by mouth at bedtime as needed for sleep. (Patient not taking: Reported on 06/27/2019)   Facility-Administered Encounter Medications as of 07/06/2019  Medication  . sodium chloride flush (NS) 0.9 % injection 10 mL    ALLERGIES:  Allergies  Allergen Reactions  . Meloxicam Other (See Comments)    Caused stomach bleeding.     PHYSICAL EXAM:  ECOG Performance status: 1  Vitals:   07/06/19 0954  BP: 121/75  Pulse: (!) 115  Resp: 20  Temp: (!) 97.1 F (36.2 C)  SpO2: 100%   Filed Weights   07/06/19 0954  Weight: 235 lb 6.4 oz (106.8 kg)    Physical Exam Vitals signs reviewed.  Constitutional:      Appearance: Normal appearance.  Cardiovascular:     Rate and Rhythm: Normal rate and regular rhythm.     Heart sounds: Normal heart sounds.  Pulmonary:     Effort: Pulmonary effort is normal.     Breath sounds: Normal breath sounds.  Abdominal:     General: There is no distension.  Palpations: Abdomen is soft. There is no mass.  Musculoskeletal:        General: No swelling.   Skin:    General: Skin is warm.  Neurological:     General: No focal deficit present.     Mental Status: He is alert and oriented to person, place, and time.  Psychiatric:        Mood and Affect: Mood normal.        Behavior: Behavior normal.      LABORATORY DATA:  I have reviewed the labs as listed.  CBC    Component Value Date/Time   WBC 6.9 06/27/2019 0823   RBC 4.13 (L) 06/27/2019 0823   HGB 12.8 (L) 06/27/2019 0823   HCT 39.6 06/27/2019 0823   PLT 204 06/27/2019 0823   MCV 95.9 06/27/2019 0823   MCH 31.0 06/27/2019 0823   MCHC 32.3 06/27/2019 0823   RDW 15.3 06/27/2019 0823   LYMPHSABS 0.8 06/27/2019 0823   MONOABS 0.4 06/27/2019 0823   EOSABS 0.0 06/27/2019 0823   BASOSABS 0.0 06/27/2019 0823   CMP Latest Ref Rng & Units 06/27/2019 06/07/2019 05/10/2019  Glucose 70 - 99 mg/dL 132(H) 119(H) 122(H)  BUN 8 - 23 mg/dL '11 10 11  '$ Creatinine 0.61 - 1.24 mg/dL 1.06 1.14 0.97  Sodium 135 - 145 mmol/L 137 139 138  Potassium 3.5 - 5.1 mmol/L 4.0 3.7 3.7  Chloride 98 - 111 mmol/L 104 104 104  CO2 22 - 32 mmol/L '22 26 27  '$ Calcium 8.9 - 10.3 mg/dL 8.6(L) 9.0 9.2  Total Protein 6.5 - 8.1 g/dL 6.6 6.6 6.6  Total Bilirubin 0.3 - 1.2 mg/dL 0.3 0.4 0.5  Alkaline Phos 38 - 126 U/L 66 72 69  AST 15 - 41 U/L 13(L) 13(L) 14(L)  ALT 0 - 44 U/L '12 14 13       '$ DIAGNOSTIC IMAGING:  I have independently reviewed the scans and discussed with the patient.   I have reviewed Venita Lick LPN's note and agree with the documentation.  I personally performed a face-to-face visit, made revisions and my assessment and plan is as follows.    ASSESSMENT & PLAN:   Prostate carcinoma ([place]) 1.  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 -Abiraterone/prednisone from 12/02/2017 through 08/2018 with progression. -Cabazitaxel 10 cycles from 09/27/2018 through 06/07/2019 with progression.  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  No BRCA-related mutation detected. -We reviewed results of bone scan dated 07/04/2019 which showed progression with new bilateral rib lesions, posterior calvarium and left sacrum. -We will hold off on cabazitaxel.  Last PSA was 67 on 06/07/2019.  Last [name] 22.5 mg injection was 06/07/2019. -I will obtain CT of the abdomen and pelvis.  We will also obtain a 2D echocardiogram. -Guardant 360 results show androgen receptor amplification indicating resistance to androgen receptor blockers.  Surprisingly he had ATM [id number] mutation. -Treatment options include olaparib which is active in this particular mutation, if we can get it approved.  Second best option would be bringing back docetaxel as he did not receive it in the metastatic setting.  Another option would be mitoxantrone.  Enzalutamide is unlikely to work given androgen receptor amplification on guardant 360. -We will see him back in 1 week for follow-up.  We will discuss treatment plan at that time.  2.  Possible port infection: -He reports stinging pain when he moves his right shoulder.  Occasionally gets pain at  rest.  Denies any fevers or chills. -He was treated with Keflex 500 mg twice daily for 1 week.  There is still erythema with no clear warmth. -I reviewed portogram which showed normal flow of the dye.  Port tubing is intact. -I will give him another trial of Bactrim DS 1 tablet twice daily for 7 days and reevaluate the port site next week.  3.  Bone metastasis: -Denosumab on hold since 12/21/2018 as he developed ONJ. -She is using chlorhexidine mouthwashes.  He was told to follow-up with his dentist as left-sided area gotten bigger all the right jaw area has improved.  4.  Back pain: -The pain extends from the lower back to the upper back and between shoulder blades.  He also has left hip pain. -He is using fentanyl patch 75 mcg.  He will continue hydrocodone 10 mg 3 to 4  tablets/day.  Total time spent is 25 minutes with more than 50% of the time spent face-to-face discussing treatment plan, counseling and coordination of care.    Orders placed this encounter:  Orders Placed This Encounter  Procedures  . CT Abdomen Pelvis W Contrast  . ECHOCARDIOGRAM COMPLETE      Derek Jack, MD [place] [phone]

## 2019-07-06 NOTE — Patient Instructions (Addendum)
[facility] at [facility] Discharge Instructions  You were seen today by Dr. Katragadda. He went over your recent lab results. He will see you back in 1 week for follow up.   Thank you for choosing [facility] at [facility] to provide your oncology and hematology care.  To afford each patient quality time with our provider, please arrive at least 15 minutes before your scheduled appointment time.   If you have a lab appointment with the Cancer Center please come in thru the  Main Entrance and check in at the main information desk  You need to re-schedule your appointment should you arrive 10 or more minutes late.  We strive to give you quality time with our providers, and arriving late affects you and other patients whose appointments are after yours.  Also, if you no show three or more times for appointments you may be dismissed from the clinic at the providers discretion.     Again, thank you for choosing [facility].  Our hope is that these requests will decrease the amount of time that you wait before being seen by our physicians.       _____________________________________________________________  Should you have questions after your visit to  Cancer Center, please contact our office at [phone] between the hours of 8:00 a.m. and 4:30 p.m.  Voicemails left after 4:00 p.m. will not be returned until the following business day.  For prescription refill requests, have your pharmacy contact our office and allow 72 hours.    Cancer Center Support Programs:   > Cancer Support Group  2nd Tuesday of the month 1pm-2pm, Journey Room    

## 2019-07-06 NOTE — Assessment & Plan Note (Addendum)
1.  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 -Abiraterone/prednisone from 12/02/2017 through 08/2018 with progression. -Cabazitaxel 10 cycles from 09/27/2018 through 06/07/2019 with progression. 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  No BRCA-related mutation detected. -We reviewed results of bone scan dated 07/04/2019 which showed progression with new bilateral rib lesions, posterior calvarium and left sacrum. -We will hold off on cabazitaxel.  Last PSA was 67 on 06/07/2019.  Last [name] 22.5 mg injection was 06/07/2019. -I will obtain CT of the abdomen and pelvis.  We will also obtain a 2D echocardiogram. -Guardant 360 results show androgen receptor amplification indicating resistance to androgen receptor blockers.  Surprisingly he had ATM [phone] mutation. -Treatment options include olaparib which is active in this particular mutation, if we can get it approved.  Second best option would be bringing back docetaxel as he did not receive it in the metastatic setting.  Another option would be mitoxantrone.  Enzalutamide is unlikely to work given androgen receptor amplification on guardant 360. -We will see him back in 1 week for follow-up.  We will discuss treatment plan at that time.  2.  Possible port infection: -He reports stinging pain when he moves his right shoulder.  Occasionally gets pain at rest.  Denies any fevers or chills. -He was treated with Keflex 500 mg twice daily for 1 week.  There is still erythema with no clear warmth. -I reviewed portogram which showed normal flow of the dye.  Port tubing is intact. -I will give him another trial of Bactrim DS 1 tablet twice daily for 7 days and reevaluate the port site next week.  3.  Bone metastasis: -Denosumab on hold since 12/21/2018 as he developed ONJ. -She is using chlorhexidine mouthwashes.  He was told to follow-up with  his dentist as left-sided area gotten bigger all the right jaw area has improved.  4.  Back pain: -The pain extends from the lower back to the upper back and between shoulder blades.  He also has left hip pain. -He is using fentanyl patch 75 mcg.  He will continue hydrocodone 10 mg 3 to 4 tablets/day.

## 2019-07-07 ENCOUNTER — Ambulatory Visit (HOSPITAL_COMMUNITY): Admission: RE | Admit: 2019-07-07 | Payer: Medicare Other | Source: Ambulatory Visit

## 2019-07-08 ENCOUNTER — Other Ambulatory Visit: Payer: Self-pay

## 2019-07-08 ENCOUNTER — Emergency Department (HOSPITAL_COMMUNITY): Payer: Medicare Other

## 2019-07-08 ENCOUNTER — Emergency Department (HOSPITAL_COMMUNITY)
Admission: EM | Admit: 2019-07-08 | Discharge: 2019-07-08 | Disposition: A | Discharge status: Home / Self Care | Payer: Medicare Other | Attending: Emergency Medicine | Admitting: Emergency Medicine

## 2019-07-08 ENCOUNTER — Encounter (HOSPITAL_COMMUNITY): Payer: Self-pay | Admitting: Emergency Medicine

## 2019-07-08 DIAGNOSIS — F17210 Nicotine dependence, cigarettes, uncomplicated: Secondary | ICD-10-CM | POA: Insufficient documentation

## 2019-07-08 DIAGNOSIS — R05 Cough: Secondary | ICD-10-CM | POA: Insufficient documentation

## 2019-07-08 DIAGNOSIS — R0602 Shortness of breath: Secondary | ICD-10-CM | POA: Diagnosis not present

## 2019-07-08 DIAGNOSIS — Z79899 Other long term (current) drug therapy: Secondary | ICD-10-CM | POA: Insufficient documentation

## 2019-07-08 DIAGNOSIS — Z85830 Personal history of malignant neoplasm of bone: Secondary | ICD-10-CM | POA: Insufficient documentation

## 2019-07-08 DIAGNOSIS — Z8546 Personal history of malignant neoplasm of prostate: Secondary | ICD-10-CM | POA: Insufficient documentation

## 2019-07-08 DIAGNOSIS — R059 Cough, unspecified: Secondary | ICD-10-CM

## 2019-07-08 DIAGNOSIS — Z20828 Contact with and (suspected) exposure to other viral communicable diseases: Secondary | ICD-10-CM | POA: Insufficient documentation

## 2019-07-08 LAB — BASIC METABOLIC PANEL
Anion gap: 9 (ref 5–15)
BUN: 10 mg/dL (ref 8–23)
CO2: 19 mmol/L — ABNORMAL LOW (ref 22–32)
Calcium: 7.2 mg/dL — ABNORMAL LOW (ref 8.9–10.3)
Chloride: 111 mmol/L (ref 98–111)
Creatinine, Ser: 1.01 mg/dL (ref 0.61–1.24)
GFR calc Af Amer: >60 mL/min (ref >60)
GFR calc non Af Amer: >60 mL/min (ref >60)
Glucose, Bld: 97 mg/dL (ref 70–99)
Potassium: 3 mmol/L — ABNORMAL LOW (ref 3.5–5.1)
Sodium: 139 mmol/L (ref 135–145)

## 2019-07-08 LAB — CBC WITH DIFFERENTIAL/PLATELET
Abs Immature Granulocytes: 0.02 10*3/uL (ref 0.00–0.07)
Basophils Absolute: 0 10*3/uL (ref 0.0–0.1)
Basophils Relative: 0 %
Eosinophils Absolute: 0 10*3/uL (ref 0.0–0.5)
Eosinophils Relative: 1 %
HCT: 36.6 % — ABNORMAL LOW (ref 39.0–52.0)
Hemoglobin: 11.8 g/dL — ABNORMAL LOW (ref 13.0–17.0)
Immature Granulocytes: 0 %
Lymphocytes Relative: 3 %
Lymphs Abs: 0.1 10*3/uL — ABNORMAL LOW (ref 0.7–4.0)
MCH: 30.5 pg (ref 26.0–34.0)
MCHC: 32.2 g/dL (ref 30.0–36.0)
MCV: 94.6 fL (ref 80.0–100.0)
Monocytes Absolute: 0.3 10*3/uL (ref 0.1–1.0)
Monocytes Relative: 6 %
Neutro Abs: 4.2 10*3/uL (ref 1.7–7.7)
Neutrophils Relative %: 90 %
Platelets: 175 10*3/uL (ref 150–400)
RBC: 3.87 MIL/uL — ABNORMAL LOW (ref 4.22–5.81)
RDW: 14.6 % (ref 11.5–15.5)
WBC: 4.7 10*3/uL (ref 4.0–10.5)
nRBC: 0 % (ref 0.0–0.2)

## 2019-07-08 LAB — SARS CORONAVIRUS 2 (TAT 6-24 HRS): SARS Coronavirus 2: NEGATIVE

## 2019-07-08 LAB — TROPONIN I (HIGH SENSITIVITY)
Troponin I (High Sensitivity): 12 ng/L (ref <18)
Troponin I (High Sensitivity): 12 ng/L (ref <18)

## 2019-07-08 LAB — POC SARS CORONAVIRUS 2 AG -  ED: SARS Coronavirus 2 Ag: NEGATIVE

## 2019-07-08 LAB — LACTIC ACID, PLASMA: Lactic Acid, Venous: 0.8 mmol/L (ref 0.5–1.9)

## 2019-07-08 MED ORDER — BENZONATATE 100 MG PO CAPS: 100.0000 mg | ORAL_CAPSULE | Freq: Three times a day (TID) | ORAL | 0 refills | Status: DC | PRN

## 2019-07-08 NOTE — ED Triage Notes (Signed)
Pt reports coughing until he passes out for past 2 days.  Sweating bad last night.

## 2019-07-08 NOTE — ED Provider Notes (Signed)
[facility] EMERGENCY DEPARTMENT Provider Note   CSN: [id number] Arrival date & time: 07/08/19  1034     History   Chief Complaint Chief Complaint  Patient presents with  . Cough    HPI Matthew [name] is a 63 y.o. male.  Presents to ER with chief complaint of cough.  Patient states he has had chronic cough for many years, feels over the past week though it has worsened somewhat.  No significant change today.  No associated shortness of breath.  States 2 days ago he had a severe coughing spell, felt lightheaded and passed out.  States LOC brief, no head trauma, no associated chest pain.  Currently not feeling lightheaded, not short of breath, no chest pain.  Patient's history notable for metastatic prostate cancer.  Lifelong smoker. DVT history, not on AC. No leg pain or swelling today.     HPI  Past Medical History:  Diagnosis Date  . Arthritis    Left knee  . Bone cancer ([place])   . Bone metastases ([place]) 12/26/2015  . Cancer [facility])    Prostate  . DVT (deep venous thrombosis) ([place]) 2004  . Family history of prostate cancer   . Family history of stomach cancer   . Kidney stones    ~2002  . Prostate carcinoma ([place]) 12/26/2015    Patient Active Problem List   Diagnosis Date Noted  . Palliative care patient 06/08/2019  . Goals of care, counseling/discussion 09/20/2018  . Genetic testing 08/26/2018  . Family history of prostate cancer   . Family history of stomach cancer   . Constipation 02/15/2018  . Esophageal dysphagia 02/15/2018  . Poor appetite 02/15/2018  . Abdominal pain, epigastric 02/15/2018  . Loss of weight 02/15/2018  . Cancer related pain 06/02/2016  . Burn 01/14/2016  . Prostate carcinoma ([place]) 12/26/2015  . Bone metastases ([place]) 12/26/2015    Past Surgical History:  Procedure Laterality Date  . CIRCUMCISION  30 years ago  . MULTIPLE EXTRACTIONS WITH ALVEOLOPLASTY N/A 03/06/2016   Procedure: Extraction of tooth #'s 3,5,6,8-12,20-22, 27, 28, and 31 with  alveoloplasty and bilateral mandibular tori reductions;  Surgeon: Lenn Cal, DDS;  Location: [place];  Service: Oral Surgery;  Laterality: N/A;        Home Medications    Prior to Admission medications   Medication Sig Start Date End Date Taking? Authorizing Provider  Calcium Carb-Cholecalciferol (CALCIUM 500 + D3) 500-600 MG-UNIT TABS Take 2 tablets by mouth daily. 12/27/15  Yes Penland, Kelby Fam, MD  ergocalciferol (VITAMIN D2) 50000 units capsule Take 1 capsule (50,000 Units total) by mouth every Friday. 04/23/18  Yes Higgs, Mathis Dad, MD  fentaNYL (DURAGESIC) 75 MCG/HR Place 1 patch onto the skin every 3 (three) days. 06/15/19  Yes Roger Shelter, FNP  HYDROcodone-acetaminophen (NORCO) 10-325 MG tablet Take 1 tablet by mouth every 6 (six) hours as needed. 07/06/19  Yes Derek Jack, MD  lubiprostone (AMITIZA) 24 MCG capsule Take 1 capsule (24 mcg total) by mouth 2 (two) times daily with a meal. 02/15/18  Yes Mahala Menghini, PA-C  NARCAN 4 MG/0.1ML LIQD nasal spray kit Place 1 spray into the nose as needed. 07/06/19  Yes [provider]  omeprazole (PRILOSEC) 20 MG capsule Take 1 capsule (20 mg total) by mouth daily. 05/10/19  Yes Derek Jack, MD  ondansetron (ZOFRAN) 8 MG tablet TAKE ONE TABLET BY MOUTH EVERY 8 HOURS AS NEEDED FOR NAUSEA OR VOMITING 09/24/18  Yes Derek Jack, MD  predniSONE (  DELTASONE) 10 MG tablet Take 1 tablet (10 mg total) by mouth daily. 01/18/19  Yes Derek Jack, MD  Pseudoeph-Chlorphen-DM (CHILDRENS NYQUIL COLD/COUGH PO) Take 15 mLs by mouth at bedtime.   Yes [provider]  sulfamethoxazole-trimethoprim (BACTRIM DS) 800-160 MG tablet Take 1 tablet by mouth 2 (two) times daily. 07/06/19  Yes Derek Jack, MD  tamsulosin (FLOMAX) 0.4 MG CAPS capsule Take 1 capsule (0.4 mg total) by mouth at bedtime. 12/21/18  Yes Derek Jack, MD  traZODone (DESYREL) 100 MG tablet Take 1 tablet (100 mg total) by mouth at  bedtime as needed for sleep. 06/08/19  Yes Roger Shelter, FNP  chlorhexidine (PERIDEX) 0.12 % solution Rinse with 15 mls twice daily for 30 seconds. Spit out excess. Do not swallow. Patient not taking: Reported on 07/06/2019 10/04/18   Lenn Cal, DDS  docusate sodium (COLACE) 100 MG capsule Take 2 capsules daily to help soften the stool.  It this does not work, you can increase to 2 capsules in the morning and 2 capsules in the evening.  If you do not have a bowel movement please call the [facility] 06/23/19   Derek Jack, MD  lidocaine-prilocaine (EMLA) cream Apply a quarter size amount to port site 1 hour prior to chemo. Do not rub in. Cover with plastic wrap. Patient not taking: Reported on 06/07/2019 09/27/18   Derek Jack, MD  prochlorperazine (COMPAZINE) 10 MG tablet Take 1 tablet (10 mg total) by mouth every 6 (six) hours as needed for nausea or vomiting. Patient not taking: Reported on 06/27/2019 06/08/19   Roger Shelter, FNP  Wheat Dextrin (BENEFIBER PO) Take 1 Package by mouth as needed.     [provider]    Family History Family History  Problem Relation Age of Onset  . Stomach cancer Mother        dx 28s  . Aneurysm Maternal Aunt        brain  . Alcohol abuse Maternal Uncle   . Stomach cancer Maternal Grandmother        dx 50s-60s  . Prostate cancer Maternal Uncle        dx 39s    Social History Social History   Tobacco Use  . Smoking status: Current Every Day Smoker    Packs/day: 1.00    Years: 50.00    Pack years: 50.00    Types: Cigarettes    Start date: 06/16/1965  . Smokeless tobacco: Former Systems developer    Types: Chew    Quit date: 08/04/2005  Substance Use Topics  . Alcohol use: No    Alcohol/week: 0.0 standard drinks  . Drug use: Yes    Types: Marijuana    Comment: THC for appetite; occ     Allergies   Meloxicam   Review of Systems Review of Systems  Constitutional: Negative for chills and fever.  HENT:  Negative for ear pain and sore throat.   Eyes: Negative for pain and visual disturbance.  Respiratory: Positive for cough and shortness of breath.   Cardiovascular: Negative for chest pain and palpitations.  Gastrointestinal: Negative for abdominal pain and vomiting.  Genitourinary: Negative for dysuria and hematuria.  Musculoskeletal: Negative for arthralgias and back pain.  Skin: Negative for color change and rash.  Neurological: Negative for seizures and syncope.  All other systems reviewed and are negative.    Physical Exam Updated Vital Signs BP 93/66 (BP Location: Right Arm)   Pulse (!) 115   Temp 98  F (36.7 C) (Oral)   Resp 19   Ht 6' (1.829 m)   Wt 106.6 kg   SpO2 95%   BMI 31.87 kg/m   Physical Exam Vitals signs and nursing note reviewed.  Constitutional:      Appearance: He is well-developed.  HENT:     Head: Normocephalic and atraumatic.  Eyes:     Conjunctiva/sclera: Conjunctivae normal.  Neck:     Musculoskeletal: Neck supple.  Cardiovascular:     Rate and Rhythm: Normal rate and regular rhythm.     Pulses: Normal pulses.     Heart sounds: No murmur.  Pulmonary:     Effort: Pulmonary effort is normal. No respiratory distress.     Breath sounds: Normal breath sounds.  Abdominal:     Palpations: Abdomen is soft.     Tenderness: There is no abdominal tenderness.  Musculoskeletal:        General: No swelling or tenderness.  Skin:    General: Skin is warm and dry.     Capillary Refill: Capillary refill takes less than 2 seconds.  Neurological:     General: No focal deficit present.     Mental Status: He is alert and oriented to person, place, and time.  Psychiatric:        Mood and Affect: Mood normal.        Behavior: Behavior normal.      ED Treatments / Results  Labs (all labs ordered are listed, but only abnormal results are displayed) Labs Reviewed  CBC WITH DIFFERENTIAL/PLATELET - Abnormal; Notable for the following components:       Result Value   RBC 3.87 (*)    Hemoglobin 11.8 (*)    HCT 36.6 (*)    Lymphs Abs 0.1 (*)    All other components within normal limits  SARS CORONAVIRUS 2 (TAT 6-24 HRS)  LACTIC ACID, PLASMA  BASIC METABOLIC PANEL  LACTIC ACID, PLASMA  POC SARS CORONAVIRUS 2 AG -  ED  TROPONIN I (HIGH SENSITIVITY)    EKG None  Radiology Dg Chest Portable 1 View  Result Date: 07/08/2019 CLINICAL DATA:  History of metastatic prostate cancer. Onset diaphoresis and coughing last night. EXAM: PORTABLE CHEST 1 VIEW COMPARISON:  Single-view of the chest and CT chest 09/23/2017. FINDINGS: The lungs are clear. Heart size is normal. No pneumothorax or pleural effusion. Multiple sclerotic bone lesions consistent with known metastatic prostate cancer seen. Port-A-Cath is in place. IMPRESSION: 1. No acute disease. 2. Findings consistent with known metastatic prostate cancer. Electronically Signed   By: Inge Rise M.D.   On: 07/08/2019 12:08    Procedures Procedures (including critical care time)  Medications Ordered in ED Medications - No data to display   Initial Impression / Assessment and Plan / ED Course  I have reviewed the triage vital signs and the nursing notes.  Pertinent labs & imaging results that were available during my care of the patient were reviewed by me and considered in my medical decision making (see chart for details).  Clinical Course as of Jul 07 1400  Fri Jul 08, 2019  1400 Pulse Rate: 85 [RD]  1400 BP: 109/72 [RD]  1400 MAP (mmHg): 84 [RD]  1400 Resp: 16 [RD]  1400 Reassessed patient, remains asymptomatic except for cough; reviewed results, will dc home  SpO2: 98 % [RD]    Clinical Course User Index [RD] Lucrezia Starch, MD       63 year old male extensive PMH most notable for  metastatic prostate CA presents to ER with chief complaint of cough.  Patient reported he had a chronic cough, but had worsened over the past week or so.  Nonproductive.  Had a severe  coughing spell leading to syncopal episode.  Suspect vasovagal and related to coughing, doubt arrhythmia in etiology.  On exam patient is noted to be well-appearing, no associated chest pain or difficulty breathing.  His chest x-ray was without evidence for pneumonia.  Labs within normal limits.  No ischemic changes on EKG, Trop within normal limits.  No events on telemetry monitoring while in the department.  Will send for PCR Covid testing.  Recommend close follow-up with his primary doctor for recheck next week on Monday.  Reviewed return precautions, believe he is appropriate for discharge at this time.    After the discussed management above, the patient was determined to be safe for discharge.  The patient was in agreement with this plan and all questions regarding their care were answered.  ED return precautions were discussed and the patient will return to the ED with any significant worsening of condition.    Final Clinical Impressions(s) / ED Diagnoses   Final diagnoses:  Cough    ED Discharge Orders    None       Lucrezia Starch, MD 07/08/19 [phone]

## 2019-07-08 NOTE — Discharge Instructions (Signed)
Please call your primary doctor to schedule recheck for early next week.  If you develop chest pain, difficulty breathing, fever or other new concerning symptom recommend return to ER for reassessment.  Can take [employer] as prescribed as needed for cough.

## 2019-07-14 ENCOUNTER — Ambulatory Visit (HOSPITAL_COMMUNITY): Admission: RE | Admit: 2019-07-14 | Payer: Medicare Other | Source: Ambulatory Visit

## 2019-07-18 ENCOUNTER — Encounter (HOSPITAL_COMMUNITY): Payer: Self-pay | Admitting: Hematology

## 2019-07-18 ENCOUNTER — Inpatient Hospital Stay (HOSPITAL_COMMUNITY): Payer: Medicare Other

## 2019-07-18 ENCOUNTER — Other Ambulatory Visit: Payer: Self-pay

## 2019-07-18 ENCOUNTER — Inpatient Hospital Stay (HOSPITAL_COMMUNITY): Payer: Medicare Other | Admitting: Hematology

## 2019-07-18 VITALS — BP 108/76 | HR 93 | Temp 97.1°F | Resp 18 | Wt 223.3 lb

## 2019-07-18 DIAGNOSIS — Z8042 Family history of malignant neoplasm of prostate: Secondary | ICD-10-CM | POA: Diagnosis not present

## 2019-07-18 DIAGNOSIS — C7951 Secondary malignant neoplasm of bone: Secondary | ICD-10-CM | POA: Diagnosis not present

## 2019-07-18 DIAGNOSIS — Z86718 Personal history of other venous thrombosis and embolism: Secondary | ICD-10-CM | POA: Diagnosis not present

## 2019-07-18 DIAGNOSIS — C61 Malignant neoplasm of prostate: Secondary | ICD-10-CM

## 2019-07-18 DIAGNOSIS — Z800 Family history of malignant neoplasm of digestive organs: Secondary | ICD-10-CM | POA: Diagnosis not present

## 2019-07-18 DIAGNOSIS — G893 Neoplasm related pain (acute) (chronic): Secondary | ICD-10-CM | POA: Diagnosis not present

## 2019-07-18 DIAGNOSIS — M25552 Pain in left hip: Secondary | ICD-10-CM | POA: Diagnosis not present

## 2019-07-18 DIAGNOSIS — M549 Dorsalgia, unspecified: Secondary | ICD-10-CM | POA: Diagnosis not present

## 2019-07-18 DIAGNOSIS — F17210 Nicotine dependence, cigarettes, uncomplicated: Secondary | ICD-10-CM | POA: Diagnosis not present

## 2019-07-18 LAB — PSA: Prostatic Specific Antigen: 104.34 ng/mL — ABNORMAL HIGH (ref 0.00–4.00)

## 2019-07-18 MED ORDER — FENTANYL 75 MCG/HR TD PT72: 1.0000 | MEDICATED_PATCH | TRANSDERMAL | 0 refills | Status: DC

## 2019-07-18 MED ORDER — OLAPARIB 150 MG PO TABS: 300.0000 mg | ORAL_TABLET | Freq: Two times a day (BID) | ORAL | 1 refills | Status: DC

## 2019-07-18 NOTE — Patient Instructions (Addendum)
[place] [facility] Discharge Instructions  You were seen today by Dr. Delton Coombes. He went over your recent lab results. The blood test shows that you could benefit from a new cancer pill, you will take the pill twice a day. We will monitor blood work as well as scans for response and side effects. He will reschedule you for the CT of your abdomen and pelvis prior to your next visit. He will see you back in 3 weeks for labs and follow up.   Thank you for choosing [place] at [facility] to provide your oncology and hematology care.  To afford each patient quality time with our provider, please arrive at least 15 minutes before your scheduled appointment time.   If you have a lab appointment with the [facility] please come in thru the  Main Entrance and check in at the main information desk  You need to re-schedule your appointment should you arrive 10 or more minutes late.  We strive to give you quality time with our providers, and arriving late affects you and other patients whose appointments are after yours.  Also, if you no show three or more times for appointments you may be dismissed from the clinic at the providers discretion.     Again, thank you for choosing [facility].  Our hope is that these requests will decrease the amount of time that you wait before being seen by our physicians.       _____________________________________________________________  Should you have questions after your visit to [facility], please contact our office at [phone] between the hours of 8:00 a.m. and 4:30 p.m.  Voicemails left after 4:00 p.m. will not be returned until the following business day.  For prescription refill requests, have your pharmacy contact our office and allow 72 hours.    Cancer Center Support Programs:   > Cancer Support Group  2nd Tuesday of the month 1pm-2pm, Journey Room

## 2019-07-18 NOTE — Progress Notes (Signed)
Matthew [name], [name] [address]   CLINIC:  Medical Oncology/Hematology  PCP:  [name], [place] [address] [phone]   REASON FOR VISIT:  Follow-up for prostate cancer   BRIEF ONCOLOGIC HISTORY:  Oncology History  Prostate carcinoma ([place])  12/10/2015 Imaging   CTA chest, multiple thoracic and rib osseous lesions. no primary evident. Atherosclerosis including aortic and CAD   12/12/2015 Imaging   CT abdomen/pelvis with sclerotic osseous lesions througout the lumbar spine, sacrum, bilateral iliac bones and L proximal femur worrisome for sclerotic osseous mets,mild prostatomegaly, no LAD, Infrarenal 3.8 cm AAA   12/12/2015 Tumor Marker   PSA 153.85   12/18/2015 Imaging   Bone Scan Diffuse bony metastatic disease, posterior calvarium, sternum, thoracic, lumbar spine, bilateral ribs, L shoulder, bilateral bony pelvis, proximal L femur   12/24/2015 Initial Biopsy   CT biopsy of L iliac bone lesion   12/26/2015 Pathology Results   Bone, biopsy, left iliac - POSITIVE FOR METASTATIC ADENOCARCINOMA.   12/28/2015 - 09/01/2016 Chemotherapy   Firmagon instituted 240 mg    01/01/2016 Procedure   Port placed by IR.   01/07/2016 - 04/11/2016 Chemotherapy   Docetaxel every 21 days x 6 cycles with dose reductions due to tolerance   02/18/2016 Adverse Reaction   GI toxicity from docetaxel, dose reduced to 60 mg/m2   02/18/2016 Treatment Plan Change   Docetaxel dose reduced by 20%   03/06/2016 Procedure   Dr. Enrique Sack- Multiple extraction of tooth numbers 3, 5, 6, 8, 9, 10, 11, 12, 20, 21, 22, 27, 28, and 31. 4 Quadrants of alveoloplasty. Bilateral mandibular lingual tori reductions.   04/09/2016 Imaging   Brain MRI 1. No intracranial metastatic disease 2. No acute intracranial abnormality 3. Findings of chronic microvascular disease   05/14/2016 Imaging   CT C/A/P Stable appearing diffuse bony metastatic disease. No new  significant disease is seen Innumerable patchy sclerotic osseous metastases throughout the axial and proximal appendicular skeleton, stable in size and distribution, significantly increased in density in the interval, likely reflecting treatment effect. 2. No new or progressive metastatic disease in the chest, abdomen or pelvis. 3. Bilateral perifissural pulmonary nodules are stable and probably benign. 4. Aortic atherosclerosis. Infrarenal 4.3 cm abdominal aortic aneurysm, minimally increased in size. Recommend followup by ultrasound in 1 year.   06/02/2016 Tumor Marker   PSA 0.99 ng/ml    - 07/31/2016 Radiation Therapy   Palliative XRT by Dr. Lianne Cure in [place].    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  The Multi-Gene Panel offered by [facility] includes sequencing and/or deletion duplication testing of the following 85 genes: AIP, ALK, APC, ATM, AXIN2,BAP1,  BARD1, BLM, BMPR1A, BRCA1, BRCA2, BRIP1, CASR, CDC73, CDH1, CDK4, CDKN1B, CDKN1C, CDKN2A (p14ARF), CDKN2A (p16INK4a), CEBPA, CHEK2, CTNNA1, DICER1, DIS3L2, EGFR (c.2369C>T, p.Thr790Met variant only), EPCAM (Deletion/duplication testing only), FH, FLCN, GATA2, GPC3, GREM1 (Promoter region deletion/duplication testing only), HOXB13 (c.251G>A, p.Gly84Glu), HRAS, KIT, MAX, MEN1, MET, MITF (c.952G>A, p.Glu318Lys variant only), MLH1, MSH2, MSH3, MSH6, MUTYH, NBN, NF1, NF2, NTHL1, PALB2, PDGFRA, PHOX2B, PMS2, POLD1, POLE, POT1, PRKAR1A, PTCH1, PTEN, RAD50, RAD51C, RAD51D, RB1, RECQL4, RET, RNF43, RUNX1, SDHAF2, SDHA (sequence changes only), SDHB, SDHC, SDHD, SMAD4, SMARCA4, SMARCB1, SMARCE1, STK11, SUFU, TERC, TERT, TMEM127, TP53, TSC1, TSC2, VHL, WRN and WT1.  The report date is August 24, 2018  This  is not thought to increase the risk for colon cancer or other cancers, unless there is the presence of two  pathogenic mutations.   09/27/2018 -  Chemotherapy   The patient had palonosetron (ALOXI) injection 0.25 mg, 0.25 mg, Intravenous,  Once, 10 of 10 cycles Administration: 0.25 mg (09/27/2018), 0.25 mg (10/25/2018), 0.25 mg (11/23/2018), 0.25 mg (12/21/2018), 0.25 mg (01/18/2019), 0.25 mg (02/15/2019), 0.25 mg (03/15/2019), 0.25 mg (04/12/2019), 0.25 mg (05/10/2019), 0.25 mg (06/07/2019) pegfilgrastim (NEULASTA ONPRO KIT) injection 6 mg, 6 mg, Subcutaneous, Once, 9 of 9 cycles Administration: 6 mg (10/25/2018), 6 mg (11/23/2018), 6 mg (12/21/2018), 6 mg (01/18/2019), 6 mg (02/15/2019), 6 mg (03/15/2019), 6 mg (04/12/2019), 6 mg (05/10/2019), 6 mg (06/07/2019) pegfilgrastim-cbqv (UDENYCA) injection 6 mg, 6 mg, Subcutaneous, Once, 1 of 1 cycle Administration: 6 mg (09/29/2018) cabazitaxel (JEVTANA) 35 mg in dextrose 5 % 250 mL chemo infusion, 15 mg/m2 = 35 mg (100 % of original dose 15 mg/m2), Intravenous,  Once, 10 of 10 cycles Dose modification: 15 mg/m2 (original dose 15 mg/m2, Cycle 1, Reason: Provider Judgment), 20 mg/m2 (original dose 15 mg/m2, Cycle 2, Reason: Provider Judgment), 20 mg/m2 (original dose 20 mg/m2, Cycle 5, Reason: Patient Age, Comment: Change in jevtana orderable), 15 mg/m2 (75 % of original dose 20 mg/m2, Cycle 8, Reason: Other (see comments), Comment: nausea, worsening numbness) Administration: 35 mg (09/27/2018), 46 mg (10/25/2018), 46 mg (11/23/2018), 46 mg (12/21/2018), 46 mg (01/18/2019), 46 mg (02/15/2019), 46 mg (03/15/2019), 35 mg (04/12/2019), 35 mg (05/10/2019), 35 mg (06/07/2019)  for chemotherapy treatment.    Bone metastases ([place])  12/26/2015 Initial Diagnosis   Bone metastases ([place])   09/27/2018 -  Chemotherapy   The patient had palonosetron (ALOXI) injection 0.25 mg, 0.25 mg, Intravenous,  Once, 4 of 6 cycles Administration: 0.25 mg (09/27/2018), 0.25 mg (10/25/2018), 0.25 mg (11/23/2018), 0.25 mg (12/21/2018) pegfilgrastim (NEULASTA ONPRO KIT) injection 6 mg, 6 mg, Subcutaneous, Once, 3 of 5  cycles Administration: 6 mg (10/25/2018), 6 mg (11/23/2018), 6 mg (12/21/2018) pegfilgrastim-cbqv (UDENYCA) injection 6 mg, 6 mg, Subcutaneous, Once, 1 of 1 cycle Administration: 6 mg (09/29/2018) cabazitaxel (JEVTANA) 35 mg in dextrose 5 % 250 mL chemo infusion, 15 mg/m2 = 35 mg (100 % of original dose 15 mg/m2), Intravenous,  Once, 4 of 6 cycles Dose modification: 15 mg/m2 (original dose 15 mg/m2, Cycle 1, Reason: Provider Judgment), 20 mg/m2 (original dose 15 mg/m2, Cycle 2, Reason: Provider Judgment) Administration: 35 mg (09/27/2018), 46 mg (10/25/2018), 46 mg (11/23/2018), 46 mg (12/21/2018)  for chemotherapy treatment.       CANCER STAGING: Cancer Staging Prostate carcinoma [facility]) Staging form: Prostate, AJCC 7th Edition - Clinical stage from 12/24/2015: Stage IV (TX, N0, M1b, PSA: 20 or greater) - Signed by Baird Cancer, PA-C on 01/07/2016    INTERVAL HISTORY:  Mr. Duckett 63 y.o. male seen for follow-up of metastatic prostate cancer to the bones.  He reports that his pain is fairly well controlled.  He is taking up to 3 pills of hydrocodone for breakthrough.  He requests refill for fentanyl patch.  He reports that the bone fragment came out of his jaw area few days ago.  He reports wound completely healed.  He is no longer using chlorhexidine.  Appetite and energy levels are 50%.  Pain in the back reported as 4 out of 10.  Chronic cough and shortness of breath on exertion are stable.  He had to go to the ER with shortness of breath.  Numbness in the toes has  been stable.  REVIEW OF SYSTEMS:  Review of Systems  Respiratory: Positive for cough and shortness of breath.   Musculoskeletal: Positive for back pain.  Neurological: Positive for numbness.  All other systems reviewed and are negative.    PAST MEDICAL/SURGICAL HISTORY:  Past Medical History:  Diagnosis Date  . Arthritis    Left knee  . Bone cancer ([place])   . Bone metastases ([place]) 12/26/2015  . Cancer [facility])    Prostate  .  DVT (deep venous thrombosis) ([place]) 2004  . Family history of prostate cancer   . Family history of stomach cancer   . Kidney stones    ~2002  . Prostate carcinoma ([place]) 12/26/2015   Past Surgical History:  Procedure Laterality Date  . CIRCUMCISION  30 years ago  . MULTIPLE EXTRACTIONS WITH ALVEOLOPLASTY N/A 03/06/2016   Procedure: Extraction of tooth #'s 3,5,6,8-12,20-22, 27, 28, and 31 with alveoloplasty and bilateral mandibular tori reductions;  Surgeon: Lenn Cal, DDS;  Location: [facility];  Service: Oral Surgery;  Laterality: N/A;     SOCIAL HISTORY:  Social History   Socioeconomic History  . Marital status: Single    Spouse name: Not on file  . Number of children: 2  . Years of education: Not on file  . Highest education level: Not on file  Occupational History  . Occupation: Construction/carpenter  Tobacco Use  . Smoking status: Current Every Day Smoker    Packs/day: 1.00    Years: 50.00    Pack years: 50.00    Types: Cigarettes    Start date: 06/16/1965  . Smokeless tobacco: Former Systems developer    Types: [place] date: 08/04/2005  Substance and Sexual Activity  . Alcohol use: No    Alcohol/week: 0.0 standard drinks  . Drug use: Yes    Types: Marijuana    Comment: THC for appetite; occ  . Sexual activity: Not on file  Other Topics Concern  . Not on file  Social History Narrative  . Not on file   Social Determinants of Health   Financial Resource Strain:   . Difficulty of Paying Living Expenses: Not on file  Food Insecurity:   . Worried About Charity fundraiser in the Last Year: Not on file  . Ran Out of Food in the Last Year: Not on file  Transportation Needs:   . Lack of Transportation (Medical): Not on file  . Lack of Transportation (Non-Medical): Not on file  Physical Activity:   . Days of Exercise per Week: Not on file  . Minutes of Exercise per Session: Not on file  Stress:   . Feeling of Stress : Not on file  Social Connections:   . Frequency of  Communication with Friends and Family: Not on file  . Frequency of Social Gatherings with Friends and Family: Not on file  . Attends Religious Services: Not on file  . Active Member of Clubs or Organizations: Not on file  . Attends Archivist Meetings: Not on file  . Marital Status: Not on file  Intimate Partner Violence:   . Fear of Current or Ex-Partner: Not on file  . Emotionally Abused: Not on file  . Physically Abused: Not on file  . Sexually Abused: Not on file    FAMILY HISTORY:  Family History  Problem Relation Age of Onset  . Stomach cancer Mother        dx 79s  . Aneurysm Maternal Aunt  brain  . Alcohol abuse Maternal Uncle   . Stomach cancer Maternal Grandmother        dx 50s-60s  . Prostate cancer Maternal Uncle        dx 29s    CURRENT MEDICATIONS:  Outpatient Encounter Medications as of 07/18/2019  Medication Sig Note  . benzonatate (TESSALON) 100 MG capsule Take 1 capsule (100 mg total) by mouth 3 (three) times daily as needed for cough.   . Calcium Carb-Cholecalciferol (CALCIUM 500 + D3) 500-600 MG-UNIT TABS Take 2 tablets by mouth daily.   . ergocalciferol (VITAMIN D2) 50000 units capsule Take 1 capsule (50,000 Units total) by mouth every Friday.   . fentaNYL (DURAGESIC) 75 MCG/HR Place 1 patch onto the skin every 3 (three) days.   Marland Kitchen HYDROcodone-acetaminophen (NORCO) 10-325 MG tablet Take 1 tablet by mouth every 6 (six) hours as needed.   . lubiprostone (AMITIZA) 24 MCG capsule Take 1 capsule (24 mcg total) by mouth 2 (two) times daily with a meal.   . omeprazole (PRILOSEC) 20 MG capsule Take 1 capsule (20 mg total) by mouth daily.   . Pseudoeph-Chlorphen-DM (CHILDRENS NYQUIL COLD/COUGH PO) Take 15 mLs by mouth at bedtime.   . sulfamethoxazole-trimethoprim (BACTRIM DS) 800-160 MG tablet Take 1 tablet by mouth 2 (two) times daily.   . tamsulosin (FLOMAX) 0.4 MG CAPS capsule Take 1 capsule (0.4 mg total) by mouth at bedtime.   . Wheat Dextrin  (BENEFIBER PO) Take 1 Package by mouth as needed.    . [DISCONTINUED] fentaNYL (DURAGESIC) 75 MCG/HR Place 1 patch onto the skin every 3 (three) days.   . chlorhexidine (PERIDEX) 0.12 % solution Rinse with 15 mls twice daily for 30 seconds. Spit out excess. Do not swallow. (Patient not taking: Reported on 07/06/2019)   . docusate sodium (COLACE) 100 MG capsule Take 2 capsules daily to help soften the stool.  It this does not work, you can increase to 2 capsules in the morning and 2 capsules in the evening.  If you do not have a bowel movement please call the [facility]. (Patient not taking: Reported on 07/18/2019)   . lidocaine-prilocaine (EMLA) cream Apply a quarter size amount to port site 1 hour prior to chemo. Do not rub in. Cover with plastic wrap. (Patient not taking: Reported on 06/07/2019)   . NARCAN 4 MG/0.1ML LIQD nasal spray kit Place 1 spray into the nose as needed.   Marland Kitchen olaparib (LYNPARZA) 150 MG tablet Take 2 tablets (300 mg total) by mouth 2 (two) times daily. Swallow whole. May take with food to decrease nausea and vomiting.   . ondansetron (ZOFRAN) 8 MG tablet TAKE ONE TABLET BY MOUTH EVERY 8 HOURS AS NEEDED FOR NAUSEA OR VOMITING (Patient not taking: Reported on 07/18/2019)   . predniSONE (DELTASONE) 10 MG tablet Take 1 tablet (10 mg total) by mouth daily. (Patient not taking: Reported on 07/18/2019) 07/08/2019: Patient is out of medication.  . prochlorperazine (COMPAZINE) 10 MG tablet Take 1 tablet (10 mg total) by mouth every 6 (six) hours as needed for nausea or vomiting. (Patient not taking: Reported on 06/27/2019)   . traZODone (DESYREL) 100 MG tablet Take 1 tablet (100 mg total) by mouth at bedtime as needed for sleep. (Patient not taking: Reported on 07/18/2019)    Facility-Administered Encounter Medications as of 07/18/2019  Medication  . sodium chloride flush (NS) 0.9 % injection 10 mL    ALLERGIES:  Allergies  Allergen Reactions  . Meloxicam Other (See  Comments)    Caused stomach bleeding.     PHYSICAL EXAM:  ECOG Performance status: 1  Vitals:   07/18/19 1456  BP: 108/76  Pulse: 93  Resp: 18  Temp: (!) 97.1 F (36.2 C)  SpO2: 97%   Filed Weights   07/18/19 1456  Weight: 223 lb 4.8 oz (101.3 kg)    Physical Exam Vitals reviewed.  Constitutional:      Appearance: Normal appearance.  Cardiovascular:     Rate and Rhythm: Normal rate and regular rhythm.     Heart sounds: Normal heart sounds.  Pulmonary:     Effort: Pulmonary effort is normal.     Breath sounds: Normal breath sounds.  Abdominal:     General: There is no distension.     Palpations: Abdomen is soft. There is no mass.  Musculoskeletal:        General: No swelling.  Skin:    General: Skin is warm.  Neurological:     General: No focal deficit present.     Mental Status: He is alert and oriented to person, place, and time.  Psychiatric:        Mood and Affect: Mood normal.        Behavior: Behavior normal.      LABORATORY DATA:  I have reviewed the labs as listed.  CBC    Component Value Date/Time   WBC 4.7 07/08/2019 1149   RBC 3.87 (L) 07/08/2019 1149   HGB 11.8 (L) 07/08/2019 1149   HCT 36.6 (L) 07/08/2019 1149   PLT 175 07/08/2019 1149   MCV 94.6 07/08/2019 1149   MCH 30.5 07/08/2019 1149   MCHC 32.2 07/08/2019 1149   RDW 14.6 07/08/2019 1149   LYMPHSABS 0.1 (L) 07/08/2019 1149   MONOABS 0.3 07/08/2019 1149   EOSABS 0.0 07/08/2019 1149   BASOSABS 0.0 07/08/2019 1149   CMP Latest Ref Rng & Units 07/08/2019 06/27/2019 06/07/2019  Glucose 70 - 99 mg/dL 97 132(H) 119(H)  BUN 8 - 23 mg/dL _0 Creatinine 0.61 - 1.24 mg/dL 1.01 1.06 1.14  Sodium 135 - 145 mmol/L 139 137 139  Potassium 3.5 - 5.1 mmol/L 3.0(L) 4.0 3.7  Chloride 98 - 111 mmol/L 111 104 104  CO2 22 - 32 mmol/L 19(L) 22 26  Calcium 8.9 - 10.3 mg/dL 7.2(L) 8.6(L) 9.0  Total Protein 6.5 - 8.1 g/dL - 6.6 6.6  Total Bilirubin 0.3 - 1.2 mg/dL - 0.3 0.4  Alkaline Phos 38 -  126 U/L - 66 72  AST 15 - 41 U/L - 13(L) 13(L)  ALT 0 - 44 U/L - 12 14       DIAGNOSTIC IMAGING:  I have independently reviewed the scans and discussed with the patient.   I have reviewed Venita Lick LPN's note and agree with the documentation.  I personally performed a face-to-face visit, made revisions and my assessment and plan is as follows.    ASSESSMENT & PLAN:   Prostate carcinoma ([place]) 1.  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 -Abiraterone/prednisone from 12/02/2017 through 08/2018 with progression. -Cabazitaxel 10 cycles from 09/27/2018 through 06/07/2019 with progression. 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  No BRCA-related mutation detected. -Bone scan on 07/04/2019 showed progression with new bilateral rib lesions, posterior calvarium and left sacrum. -PSA on 06/07/2019 was 67.  Last [name] was on 06/07/2019. -Guardant 360 results show androgen receptor amplification indicating resistance to androgen receptor  blockers.  Surprisingly he had ATM [phone] mutation. -Treatment options include olaparib which is active in this particular mutation, if we can get it approved.  Second best option would be bringing back docetaxel as he did not receive it in the metastatic setting.  Another option would be mitoxantrone.  Enzalutamide is unlikely to work given androgen receptor amplification on guardant 360. -Based on the ATM mutation, I have recommended olaparib.  We discussed about the side effects in detail.  He will be started at full dose of 300 mg twice daily. -I will also obtain a CT scan of the abdomen and pelvis as baseline.  2.  Possible port infection: -This was treated with Bactrim with improvement.  3.  Bone metastasis: -Denosumab on hold since 12/21/2018 as he developed ONJ. -He reports a bone fragment has come out recently.  I would continue to  hold it indefinitely.  4.  Back pain: -He will continue fentanyl patch 75 mcg.  I have given a refill. -He is using hydrocodone 10 mg 3 to 4 tablets/day.  Total time spent is 25 minutes with more than 50% of the time spent face-to-face discussing treatment plan, counseling and coordination of care.    Orders placed this encounter:  Orders Placed This Encounter  Procedures  . PSA      Derek Jack, MD [place] [phone]

## 2019-07-18 NOTE — Assessment & Plan Note (Signed)
1.  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 -Abiraterone/prednisone from 12/02/2017 through 08/2018 with progression. -Cabazitaxel 10 cycles from 09/27/2018 through 06/07/2019 with progression. 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  No BRCA-related mutation detected. -Bone scan on 07/04/2019 showed progression with new bilateral rib lesions, posterior calvarium and left sacrum. -PSA on 06/07/2019 was 67.  Last [name] was on 06/07/2019. -Guardant 360 results show androgen receptor amplification indicating resistance to androgen receptor blockers.  Surprisingly he had ATM [phone] mutation. -Treatment options include olaparib which is active in this particular mutation, if we can get it approved.  Second best option would be bringing back docetaxel as he did not receive it in the metastatic setting.  Another option would be mitoxantrone.  Enzalutamide is unlikely to work given androgen receptor amplification on guardant 360. -Based on the ATM mutation, I have recommended olaparib.  We discussed about the side effects in detail.  He will be started at full dose of 300 mg twice daily. -I will also obtain a CT scan of the abdomen and pelvis as baseline.  2.  Possible port infection: -This was treated with Bactrim with improvement.  3.  Bone metastasis: -Denosumab on hold since 12/21/2018 as he developed ONJ. -He reports a bone fragment has come out recently.  I would continue to hold it indefinitely.  4.  Back pain: -He will continue fentanyl patch 75 mcg.  I have given a refill. -He is using hydrocodone 10 mg 3 to 4 tablets/day.

## 2019-07-19 ENCOUNTER — Telehealth (HOSPITAL_COMMUNITY): Payer: Self-pay | Admitting: Pharmacist

## 2019-07-19 ENCOUNTER — Telehealth: Payer: Self-pay | Admitting: Pharmacist

## 2019-07-19 NOTE — Telephone Encounter (Signed)
Oral Oncology Pharmacist Encounter   Received notification from [facility] that prior authorization for Matthew [name] is required.   PA submitted on CMM Key [id number] Status is pending   Oral Oncology Clinic will continue to follow.   Leron Croak, PharmD, BCPS PGY2 Hematology/Oncology Pharmacy Resident 07/19/2019 2:05 PM

## 2019-07-19 NOTE — Telephone Encounter (Signed)
Oral Oncology Pharmacist Encounter  Received new prescription for Lynparza (olaparib) for the treatment of metastatic castrate resistant prostate cancer with ATM [phone] mutation in conjunction with [name], planned duration until disease progression or unacceptable toxicity.  Plan is for patient to initiate olaparib 300 mg (two 150 mg tablets) by mouth twice daily. Dose and frequency assessed and appropriate for patient.  BMP and CBC w/ Diff from 07/08/19 assessed - patient OK for initiation of Lynparza.   Current medication list in Epic reviewed, no  DDIs identified with [place]   Prescription has been e-scribed to the [facility] for benefits analysis and approval.  Oral Oncology Clinic will continue to follow for insurance authorization, copayment issues, initial counseling and start date.  Leron Croak, PharmD, [employer] PGY2 Hematology/Oncology Pharmacy Resident 07/19/2019 12:22 PM Oral Oncology Clinic [phone]

## 2019-07-19 NOTE — Telephone Encounter (Signed)
Oral Oncology Pharmacist Encounter   Prior Authorization for [place] has been approved.    Effective dates: 07/19/2019 through 07/18/2020   Oral Oncology Clinic will continue to follow.   Leron Croak, PharmD, BCPS PGY2 Hematology/Oncology Pharmacy Resident 07/19/2019 2:06 PM

## 2019-07-25 ENCOUNTER — Telehealth (HOSPITAL_COMMUNITY): Payer: Self-pay | Admitting: Pharmacy Technician

## 2019-07-25 NOTE — Telephone Encounter (Signed)
Oral Oncology Patient Advocate Encounter  Daughter, Lenna Sciara, emailed the application for [employer] [place] PAP in an effort to reduce patient's out of pocket expense for Lynparza to $0.    Application completed and faxed to [phone].   [employer] patient assistance phone number for follow up is [phone].   This encounter will be updated until final determination.   [place] Patient [place] Phone [phone] Fax [phone] 07/25/2019 11:52 AM

## 2019-07-26 NOTE — Telephone Encounter (Addendum)
Oral Oncology Patient Advocate Encounter  Received notification from [employer] Prescription Savings program that patient has been successfully enrolled into their program to receive Lynparza from the manufacturer at $0 out of pocket until 08/04/2019.   I called and spoke with patients daughter, Lenna Sciara.   She is aware that [employer] will notify them with instructions on how to reapply for next year when Mr Winns is closer to the end of his enrollment period.   First shipment of medication delivered 07/28/2019.  Patient knows to call the office with questions or concerns.   Oral Oncology Clinic will continue to follow.  [place] Patient [place] Phone [phone] Fax [phone] 07/26/2019 1:41 PM

## 2019-07-27 ENCOUNTER — Ambulatory Visit (INDEPENDENT_AMBULATORY_CARE_PROVIDER_SITE_OTHER): Payer: Medicare Other | Admitting: Urology

## 2019-07-27 ENCOUNTER — Encounter: Payer: Self-pay | Admitting: Urology

## 2019-07-27 ENCOUNTER — Encounter (HOSPITAL_COMMUNITY): Payer: Self-pay | Admitting: *Deleted

## 2019-07-27 ENCOUNTER — Other Ambulatory Visit: Payer: Self-pay

## 2019-07-27 VITALS — BP 104/73 | HR 139 | Temp 96.8°F | Ht 72.0 in | Wt 223.0 lb

## 2019-07-27 DIAGNOSIS — R350 Frequency of micturition: Secondary | ICD-10-CM

## 2019-07-27 DIAGNOSIS — N401 Enlarged prostate with lower urinary tract symptoms: Secondary | ICD-10-CM

## 2019-07-27 DIAGNOSIS — R310 Gross hematuria: Secondary | ICD-10-CM

## 2019-07-27 DIAGNOSIS — N138 Other obstructive and reflux uropathy: Secondary | ICD-10-CM

## 2019-07-27 LAB — POCT URINALYSIS DIPSTICK
Bilirubin, UA: NEGATIVE
Glucose, UA: NEGATIVE
Ketones, UA: NEGATIVE
Leukocytes, UA: NEGATIVE
Nitrite, UA: NEGATIVE
Protein, UA: POSITIVE — AB
Spec Grav, UA: 1.025 (ref 1.010–1.025)
Urobilinogen, UA: NEGATIVE E.U./dL — AB
pH, UA: 6.5 (ref 5.0–8.0)

## 2019-07-27 LAB — BLADDER SCAN AMB NON-IMAGING: Scan Result: 29.5

## 2019-07-27 MED ORDER — SILODOSIN 8 MG PO CAPS: 8.0000 mg | ORAL_CAPSULE | Freq: Every day | ORAL | 11 refills | Status: AC

## 2019-07-27 MED ORDER — CEFPODOXIME PROXETIL 200 MG PO TABS: 200.0000 mg | ORAL_TABLET | Freq: Two times a day (BID) | ORAL | 0 refills | Status: DC

## 2019-07-27 NOTE — Progress Notes (Addendum)
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 PSA 67. This morning he woke up with gross hematuria with associated urgency, frequency, and dysuria. No previous gross hematuria. Remote hx of nephrolithaisis. He has a 55pk year smoking hx. No other LUTS. No exacerbating/alleviating events. No other associated symptoms. CT shows no renal, ureteral masses. He previously tried flomax for his LUTS which failed to improve his LUTS   PMH: Past Medical History:  Diagnosis Date  . Arthritis    Left knee  . Bone cancer ([place])   . Bone metastases ([place]) 12/26/2015  . Cancer [facility])    Prostate  . DVT (deep venous thrombosis) ([place]) 2004  . Family history of prostate cancer   . Family history of stomach cancer   . Kidney stones    ~2002  . Prostate carcinoma ([place]) 12/26/2015    Surgical History: Past Surgical History:  Procedure Laterality Date  . CIRCUMCISION  30 years ago  . MULTIPLE EXTRACTIONS WITH ALVEOLOPLASTY N/A 03/06/2016   Procedure: Extraction of tooth #'s 3,5,6,8-12,20-22, 27, 28, and 31 with alveoloplasty and bilateral mandibular tori reductions;  Surgeon: Lenn Cal, DDS;  Location: [facility];  Service: Oral Surgery;  Laterality: N/A;    Home Medications:  Allergies as of 07/27/2019      Reactions   Meloxicam Other (See Comments)   Caused stomach bleeding.      Medication List       Accurate as of July 27, 2019  2:49 PM. If you have any questions, ask your nurse or doctor.        STOP taking these medications   benzonatate 100 MG capsule Commonly known as: TESSALON Stopped by: Nicolette Bang, MD   lubiprostone 24 MCG capsule Commonly known as: Amitiza Stopped by: Nicolette Bang, MD   sulfamethoxazole-trimethoprim 800-160 MG tablet Commonly known as: BACTRIM  DS Stopped by: Nicolette Bang, MD   tamsulosin 0.4 MG Caps capsule Commonly known as: Flomax Stopped by: Nicolette Bang, MD     TAKE these medications   BENEFIBER PO Take 1 Package by mouth as needed.   Calcium Carb-Cholecalciferol 500-600 MG-UNIT Tabs Commonly known as: Calcium 500 + D3 Take 2 tablets by mouth daily.   CHILDRENS NYQUIL COLD/COUGH PO Take 15 mLs by mouth at bedtime.   chlorhexidine 0.12 % solution Commonly known as: PERIDEX Rinse with 15 mls twice daily for 30 seconds. Spit out excess. Do not swallow.   docusate sodium 100 MG capsule Commonly known as: Colace Take 2 capsules daily to help soften the stool.  It this does not work, you can increase to 2 capsules in the morning and 2 capsules in the evening.  If you do not have a bowel movement please call the [facility].   ergocalciferol 1.25 MG (50000 UT) capsule Commonly known as: VITAMIN D2 Take 1 capsule (50,000 Units total) by mouth every Friday.   fentaNYL 75 MCG/HR Commonly known as: [place] 1 patch onto the skin every 3 (three) days.   HYDROcodone-acetaminophen 10-325 MG tablet Commonly known as: NORCO Take 1 tablet by mouth every 6 (six) hours as needed.   lidocaine-prilocaine cream Commonly known as: EMLA Apply a quarter size amount to port site 1 hour prior to chemo. Do not rub in. Cover with plastic  wrap.   Narcan 4 MG/0.1ML Liqd nasal spray kit Generic drug: naloxone Place 1 spray into the nose as needed.   olaparib 150 MG tablet Commonly known as: LYNPARZA Take 2 tablets (300 mg total) by mouth 2 (two) times daily. Swallow whole. May take with food to decrease nausea and vomiting.   omeprazole 20 MG capsule Commonly known as: PRILOSEC Take 1 capsule (20 mg total) by mouth daily.   ondansetron 8 MG tablet Commonly known as: ZOFRAN TAKE ONE TABLET BY MOUTH EVERY 8 HOURS AS NEEDED FOR NAUSEA OR VOMITING   predniSONE 10 MG tablet Commonly known as:  DELTASONE Take 1 tablet (10 mg total) by mouth daily.   prochlorperazine 10 MG tablet Commonly known as: COMPAZINE Take 1 tablet (10 mg total) by mouth every 6 (six) hours as needed for nausea or vomiting.   traZODone 100 MG tablet Commonly known as: DESYREL Take 1 tablet (100 mg total) by mouth at bedtime as needed for sleep.       Allergies:  Allergies  Allergen Reactions  . Meloxicam Other (See Comments)    Caused stomach bleeding.    Family History: Family History  Problem Relation Age of Onset  . Stomach cancer Mother        dx 30s  . Aneurysm Maternal Aunt        brain  . Alcohol abuse Maternal Uncle   . Stomach cancer Maternal Grandmother        dx 50s-60s  . Prostate cancer Maternal Uncle        dx 11s    Social History:  reports that he has been smoking cigarettes. He started smoking about 54 years ago. He has a 50.00 pack-year smoking history. He quit smokeless tobacco use about 13 years ago.  His smokeless tobacco use included chew. He reports current drug use. Drug: Marijuana. He reports that he does not drink alcohol.  ROS:   Urological Symptom Review  Patient is experiencing the following symptoms: Blood in urine   Review of Systems  Gastrointestinal (upper)  : Negative for upper GI symptoms  Gastrointestinal (lower) : Negative for lower GI symptoms  Constitutional : Negative for symptoms  Skin: Negative for skin symptoms  Eyes: Negative for eye symptoms  Ear/Nose/Throat : Negative for Ear/Nose/Throat symptoms  Hematologic/Lymphatic: Negative for Hematologic/Lymphatic symptoms  Cardiovascular : Negative for cardiovascular symptoms  Respiratory : Negative for respiratory symptoms  Endocrine: Negative for endocrine symptoms  Musculoskeletal: Negative for musculoskeletal symptoms  Neurological: Negative for neurological symptoms  Psychologic: Negative for psychiatric symptoms                                        Physical Exam: BP 104/73   Pulse (!) 139   Temp (!) 96.8 F (36 C)   Ht 6' (1.829 m)   Wt 223 lb (101.2 kg)   BMI 30.24 kg/m   Constitutional:  Alert and oriented, No acute distress. HEENT: [place] AT, moist mucus membranes.  Trachea midline, no masses. Cardiovascular: No clubbing, cyanosis, or edema. Respiratory: Normal respiratory effort, no increased work of breathing. GI: Abdomen is soft, nontender, nondistended, no abdominal masses GU: No CVA tenderness Lymph: No cervical or inguinal lymphadenopathy. Skin: No rashes, bruises or suspicious lesions. Neurologic: Grossly intact, no focal deficits, moving all 4 extremities. Psychiatric: Normal mood and affect.  Laboratory Data: Lab Results  Component Value Date  WBC 4.7 07/08/2019   HGB 11.8 (L) 07/08/2019   HCT 36.6 (L) 07/08/2019   MCV 94.6 07/08/2019   PLT 175 07/08/2019    Lab Results  Component Value Date   CREATININE 1.01 07/08/2019    Lab Results  Component Value Date   PSA 0.20 12/10/2016   PSA 0.28 09/29/2016   PSA 0.43 09/01/2016    Lab Results  Component Value Date   TESTOSTERONE 20 (L) 08/19/2017    No results found for: HGBA1C  Urinalysis    Component Value Date/Time   COLORURINE YELLOW 12/10/2015 [place] 12/10/2015 1140   LABSPEC <1.005 (L) 12/10/2015 1140   PHURINE 5.5 12/10/2015 1140   GLUCOSEU NEGATIVE 12/10/2015 [place] 12/10/2015 [place] 12/10/2015 [place] 12/10/2015 1140   PROTEINUR NEGATIVE 12/10/2015 1140   NITRITE NEGATIVE 12/10/2015 1140   LEUKOCYTESUR NEGATIVE 12/10/2015 1140    Lab Results  Component Value Date   BACTERIA RARE (A) 12/10/2015    Pertinent Imaging: No results found for this or any previous visit. No results found for this or any previous visit. No results found for this or any previous visit. No results found for this or any previous visit. No results found for this or  any previous visit. No results found for this or any previous visit. No results found for this or any previous visit. No results found for this or any previous visit.  Assessment & Plan:    1. Gross hematuria: -urine for culture, will call with results -[place] 261m BID for 7 days  2. BPH with LUTS, urinary frequency -rapaflo 855mdaily  No follow-ups on file.  PaNicolette BangMD  [facility]

## 2019-07-27 NOTE — Patient Instructions (Signed)
Hematuria, Adult Hematuria is blood in the urine. Blood may be visible in the urine, or it may be identified with a test. This condition can be caused by infections of the bladder, urethra, kidney, or prostate. Other possible causes include:  Kidney stones.  Cancer of the urinary tract.  Too much calcium in the urine.  Conditions that are passed from parent to child (inherited conditions).  Exercise that requires a lot of energy. Infections can usually be treated with medicine, and a kidney stone usually will pass through your urine. If neither of these is the cause of your hematuria, more tests may be needed to identify the cause of your symptoms. It is very important to tell your health care provider about any blood in your urine, even if it is painless or the blood stops without treatment. Blood in the urine, when it happens and then stops and then happens again, can be a symptom of a very serious condition, including cancer. There is no pain in the initial stages of many urinary cancers. Follow these instructions at home: Medicines  Take over-the-counter and prescription medicines only as told by your health care provider.  If you were prescribed an antibiotic medicine, take it as told by your health care provider. Do not stop taking the antibiotic even if you start to feel better. Eating and drinking  Drink enough fluid to keep your urine clear or pale yellow. It is recommended that you drink 3-4 quarts (2.8-3.8 L) a day. If you have been diagnosed with an infection, it is recommended that you drink cranberry juice in addition to large amounts of water.  Avoid caffeine, tea, and carbonated beverages. These tend to irritate the bladder.  Avoid alcohol because it may irritate the prostate (men). General instructions  If you have been diagnosed with a kidney stone, follow your health care provider's instructions about straining your urine to catch the stone.  Empty your bladder  often. Avoid holding urine for long periods of time.  If you are male: ? After a bowel movement, wipe from front to back and use each piece of toilet paper only once. ? Empty your bladder before and after sex.  Pay attention to any changes in your symptoms. Tell your health care provider about any changes or any new symptoms.  It is your responsibility to get your test results. Ask your health care provider, or the department performing the test, when your results will be ready.  Keep all follow-up visits as told by your health care provider. This is important. Contact a health care provider if:  You develop back pain.  You have a fever.  You have nausea or vomiting.  Your symptoms do not improve after 3 days.  Your symptoms get worse. Get help right away if:  You develop severe vomiting and are unable take medicine without vomiting.  You develop severe pain in your back or abdomen even though you are taking medicine.  You pass a large amount of blood in your urine.  You pass blood clots in your urine.  You feel very weak or like you might faint.  You faint. Summary  Hematuria is blood in the urine. It has many possible causes.  It is very important that you tell your health care provider about any blood in your urine, even if it is painless or the blood stops without treatment.  Take over-the-counter and prescription medicines only as told by your health care provider.  Drink enough fluid to keep   your urine clear or pale yellow. This information is not intended to replace advice given to you by your health care provider. Make sure you discuss any questions you have with your health care provider. Document Released: 07/21/2005 Document Revised: 07/03/2017 Document Reviewed: 08/23/2016 Elsevier Patient Education  2020 [employer].

## 2019-07-27 NOTE — Progress Notes (Signed)
Received a call from patient's daughter Lenna Sciara today. She reports that her dad woke up this morning with a very strong urge to urinate. His urine was bright red and a lot of it.  He denies any pain. Denies any bleeding prior to this morning.  He does not have a urologist.   I have talked with Harriet Pho, NP and she recommends referring urgently to urology.   Patient has an appointment with [facility] Dr. Aline August today at 2:30pm.  Lenna Sciara aware of appointment and will get him there.

## 2019-08-02 ENCOUNTER — Other Ambulatory Visit (HOSPITAL_COMMUNITY): Payer: Self-pay | Admitting: Emergency Medicine

## 2019-08-02 DIAGNOSIS — C61 Malignant neoplasm of prostate: Secondary | ICD-10-CM

## 2019-08-02 DIAGNOSIS — C7951 Secondary malignant neoplasm of bone: Secondary | ICD-10-CM

## 2019-08-02 DIAGNOSIS — G893 Neoplasm related pain (acute) (chronic): Secondary | ICD-10-CM

## 2019-08-02 MED ORDER — HYDROCODONE-ACETAMINOPHEN 10-325 MG PO TABS: 1.0000 | ORAL_TABLET | Freq: Four times a day (QID) | ORAL | 0 refills | Status: DC | PRN

## 2019-08-03 ENCOUNTER — Other Ambulatory Visit: Payer: Medicare Other | Admitting: Urology

## 2019-08-03 ENCOUNTER — Other Ambulatory Visit (HOSPITAL_COMMUNITY): Payer: Self-pay | Admitting: Hematology

## 2019-08-03 DIAGNOSIS — C61 Malignant neoplasm of prostate: Secondary | ICD-10-CM

## 2019-08-10 ENCOUNTER — Other Ambulatory Visit: Payer: Medicare Other | Admitting: Urology

## 2019-08-10 ENCOUNTER — Ambulatory Visit (HOSPITAL_COMMUNITY): Payer: Medicare Other | Admitting: Hematology

## 2019-08-11 ENCOUNTER — Other Ambulatory Visit: Payer: Self-pay

## 2019-08-11 ENCOUNTER — Ambulatory Visit (HOSPITAL_COMMUNITY)
Admission: RE | Admit: 2019-08-11 | Discharge: 2019-08-11 | Disposition: A | Discharge status: Home / Self Care | Payer: Medicare Other | Source: Ambulatory Visit | Attending: Hematology | Admitting: Hematology

## 2019-08-11 ENCOUNTER — Inpatient Hospital Stay (HOSPITAL_COMMUNITY): Payer: Medicare Other | Attending: Hematology

## 2019-08-11 DIAGNOSIS — F17210 Nicotine dependence, cigarettes, uncomplicated: Secondary | ICD-10-CM | POA: Insufficient documentation

## 2019-08-11 DIAGNOSIS — C61 Malignant neoplasm of prostate: Secondary | ICD-10-CM | POA: Insufficient documentation

## 2019-08-11 DIAGNOSIS — D696 Thrombocytopenia, unspecified: Secondary | ICD-10-CM | POA: Diagnosis not present

## 2019-08-11 DIAGNOSIS — M79639 Pain in unspecified forearm: Secondary | ICD-10-CM | POA: Insufficient documentation

## 2019-08-11 DIAGNOSIS — Z800 Family history of malignant neoplasm of digestive organs: Secondary | ICD-10-CM | POA: Insufficient documentation

## 2019-08-11 DIAGNOSIS — C7951 Secondary malignant neoplasm of bone: Secondary | ICD-10-CM

## 2019-08-11 DIAGNOSIS — M549 Dorsalgia, unspecified: Secondary | ICD-10-CM | POA: Insufficient documentation

## 2019-08-11 DIAGNOSIS — Z86718 Personal history of other venous thrombosis and embolism: Secondary | ICD-10-CM | POA: Insufficient documentation

## 2019-08-11 DIAGNOSIS — M79603 Pain in arm, unspecified: Secondary | ICD-10-CM | POA: Insufficient documentation

## 2019-08-11 DIAGNOSIS — Z8042 Family history of malignant neoplasm of prostate: Secondary | ICD-10-CM | POA: Diagnosis not present

## 2019-08-11 LAB — CBC WITH DIFFERENTIAL/PLATELET
Abs Immature Granulocytes: 0.03 10*3/uL (ref 0.00–0.07)
Basophils Absolute: 0 10*3/uL (ref 0.0–0.1)
Basophils Relative: 1 %
Eosinophils Absolute: 0.1 10*3/uL (ref 0.0–0.5)
Eosinophils Relative: 4 %
HCT: 39.1 % (ref 39.0–52.0)
Hemoglobin: 12.7 g/dL — ABNORMAL LOW (ref 13.0–17.0)
Immature Granulocytes: 1 %
Lymphocytes Relative: 23 %
Lymphs Abs: 0.8 10*3/uL (ref 0.7–4.0)
MCH: 29.6 pg (ref 26.0–34.0)
MCHC: 32.5 g/dL (ref 30.0–36.0)
MCV: 91.1 fL (ref 80.0–100.0)
Monocytes Absolute: 0.2 10*3/uL (ref 0.1–1.0)
Monocytes Relative: 6 %
Neutro Abs: 2.5 10*3/uL (ref 1.7–7.7)
Neutrophils Relative %: 65 %
Platelets: 241 10*3/uL (ref 150–400)
RBC: 4.29 MIL/uL (ref 4.22–5.81)
RDW: 15 % (ref 11.5–15.5)
WBC: 3.7 10*3/uL — ABNORMAL LOW (ref 4.0–10.5)
nRBC: 0.5 % — ABNORMAL HIGH (ref 0.0–0.2)

## 2019-08-11 LAB — COMPREHENSIVE METABOLIC PANEL
ALT: 19 U/L (ref 0–44)
AST: 16 U/L (ref 15–41)
Albumin: 3.6 g/dL (ref 3.5–5.0)
Alkaline Phosphatase: 91 U/L (ref 38–126)
Anion gap: 12 (ref 5–15)
BUN: 12 mg/dL (ref 8–23)
CO2: 25 mmol/L (ref 22–32)
Calcium: 9.6 mg/dL (ref 8.9–10.3)
Chloride: 102 mmol/L (ref 98–111)
Creatinine, Ser: 1.28 mg/dL — ABNORMAL HIGH (ref 0.61–1.24)
GFR calc Af Amer: >60 mL/min (ref >60)
GFR calc non Af Amer: 59 mL/min — ABNORMAL LOW (ref >60)
Glucose, Bld: 98 mg/dL (ref 70–99)
Potassium: 3.9 mmol/L (ref 3.5–5.1)
Sodium: 139 mmol/L (ref 135–145)
Total Bilirubin: 0.6 mg/dL (ref 0.3–1.2)
Total Protein: 6.7 g/dL (ref 6.5–8.1)

## 2019-08-11 MED ORDER — IOHEXOL 300 MG/ML  SOLN
100.0000 mL | Freq: Once | INTRAMUSCULAR | Status: AC | PRN
  Administered 2019-08-11: 100 mL via INTRAVENOUS

## 2019-08-11 NOTE — Telephone Encounter (Signed)
Oral Oncology Patient Advocate Encounter  Called [employer] to check delivery status of Lynparza.  Medication was delivered on 07/28/2019.    Patient must submit patient consent and Medicare D Attestation form online for renewal for 2021.    I will check the status of his enrollment after forms have been submitted.  [place] Patient [place] Phone [phone] Fax [phone] 08/11/2019 11:02 AM

## 2019-08-15 ENCOUNTER — Ambulatory Visit (HOSPITAL_COMMUNITY): Payer: Medicare Other | Admitting: Hematology

## 2019-08-16 ENCOUNTER — Ambulatory Visit (HOSPITAL_COMMUNITY): Payer: Medicare Other | Admitting: Hematology

## 2019-08-19 ENCOUNTER — Other Ambulatory Visit (HOSPITAL_COMMUNITY): Payer: Self-pay | Admitting: Nurse Practitioner

## 2019-08-23 ENCOUNTER — Inpatient Hospital Stay (HOSPITAL_COMMUNITY): Payer: Medicare Other | Admitting: Hematology

## 2019-08-23 ENCOUNTER — Other Ambulatory Visit: Payer: Self-pay

## 2019-08-23 ENCOUNTER — Other Ambulatory Visit (HOSPITAL_COMMUNITY): Payer: Self-pay | Admitting: *Deleted

## 2019-08-23 ENCOUNTER — Telehealth (HOSPITAL_COMMUNITY): Payer: Self-pay | Admitting: Pharmacy Technician

## 2019-08-23 ENCOUNTER — Encounter (HOSPITAL_COMMUNITY): Payer: Self-pay | Admitting: Hematology

## 2019-08-23 DIAGNOSIS — C61 Malignant neoplasm of prostate: Secondary | ICD-10-CM

## 2019-08-23 DIAGNOSIS — Z86718 Personal history of other venous thrombosis and embolism: Secondary | ICD-10-CM | POA: Diagnosis not present

## 2019-08-23 DIAGNOSIS — G893 Neoplasm related pain (acute) (chronic): Secondary | ICD-10-CM | POA: Diagnosis not present

## 2019-08-23 DIAGNOSIS — M79639 Pain in unspecified forearm: Secondary | ICD-10-CM | POA: Diagnosis not present

## 2019-08-23 DIAGNOSIS — F17210 Nicotine dependence, cigarettes, uncomplicated: Secondary | ICD-10-CM | POA: Diagnosis not present

## 2019-08-23 DIAGNOSIS — Z8042 Family history of malignant neoplasm of prostate: Secondary | ICD-10-CM | POA: Diagnosis not present

## 2019-08-23 DIAGNOSIS — C7951 Secondary malignant neoplasm of bone: Secondary | ICD-10-CM | POA: Diagnosis not present

## 2019-08-23 DIAGNOSIS — D696 Thrombocytopenia, unspecified: Secondary | ICD-10-CM | POA: Diagnosis not present

## 2019-08-23 DIAGNOSIS — M79603 Pain in arm, unspecified: Secondary | ICD-10-CM | POA: Diagnosis not present

## 2019-08-23 DIAGNOSIS — M549 Dorsalgia, unspecified: Secondary | ICD-10-CM | POA: Diagnosis not present

## 2019-08-23 DIAGNOSIS — Z800 Family history of malignant neoplasm of digestive organs: Secondary | ICD-10-CM | POA: Diagnosis not present

## 2019-08-23 LAB — CBC WITH DIFFERENTIAL/PLATELET
Abs Immature Granulocytes: 0.02 10*3/uL (ref 0.00–0.07)
Basophils Absolute: 0 10*3/uL (ref 0.0–0.1)
Basophils Relative: 1 %
Eosinophils Absolute: 0.1 10*3/uL (ref 0.0–0.5)
Eosinophils Relative: 4 %
HCT: 39.7 % (ref 39.0–52.0)
Hemoglobin: 12 g/dL — ABNORMAL LOW (ref 13.0–17.0)
Immature Granulocytes: 1 %
Lymphocytes Relative: 19 %
Lymphs Abs: 0.8 10*3/uL (ref 0.7–4.0)
MCH: 30.5 pg (ref 26.0–34.0)
MCHC: 30.2 g/dL (ref 30.0–36.0)
MCV: 101 fL — ABNORMAL HIGH (ref 80.0–100.0)
Monocytes Absolute: 0.4 10*3/uL (ref 0.1–1.0)
Monocytes Relative: 9 %
Neutro Abs: 2.7 10*3/uL (ref 1.7–7.7)
Neutrophils Relative %: 66 %
Platelets: 123 10*3/uL — ABNORMAL LOW (ref 150–400)
RBC: 3.93 MIL/uL — ABNORMAL LOW (ref 4.22–5.81)
RDW: 16.2 % — ABNORMAL HIGH (ref 11.5–15.5)
WBC: 4 10*3/uL (ref 4.0–10.5)
nRBC: 0.8 % — ABNORMAL HIGH (ref 0.0–0.2)

## 2019-08-23 LAB — COMPREHENSIVE METABOLIC PANEL
ALT: 13 U/L (ref 0–44)
AST: 13 U/L — ABNORMAL LOW (ref 15–41)
Albumin: 3.5 g/dL (ref 3.5–5.0)
Alkaline Phosphatase: 82 U/L (ref 38–126)
Anion gap: 8 (ref 5–15)
BUN: 12 mg/dL (ref 8–23)
CO2: 24 mmol/L (ref 22–32)
Calcium: 9 mg/dL (ref 8.9–10.3)
Chloride: 107 mmol/L (ref 98–111)
Creatinine, Ser: 1.21 mg/dL (ref 0.61–1.24)
GFR calc Af Amer: >60 mL/min (ref >60)
GFR calc non Af Amer: >60 mL/min (ref >60)
Glucose, Bld: 109 mg/dL — ABNORMAL HIGH (ref 70–99)
Potassium: 3.3 mmol/L — ABNORMAL LOW (ref 3.5–5.1)
Sodium: 139 mmol/L (ref 135–145)
Total Bilirubin: 0.4 mg/dL (ref 0.3–1.2)
Total Protein: 6.5 g/dL (ref 6.5–8.1)

## 2019-08-23 LAB — MAGNESIUM: Magnesium: 1.9 mg/dL (ref 1.7–2.4)

## 2019-08-23 MED ORDER — FENTANYL 75 MCG/HR TD PT72: 1.0000 | MEDICATED_PATCH | TRANSDERMAL | 0 refills | Status: DC

## 2019-08-23 MED ORDER — OLAPARIB 150 MG PO TABS: 300.0000 mg | ORAL_TABLET | Freq: Two times a day (BID) | ORAL | 1 refills | Status: DC

## 2019-08-23 NOTE — Telephone Encounter (Signed)
Oral Oncology Patient Advocate Encounter  Was successful in securing patient a $8,000 grant from [employer] to provide copayment coverage for [place].  This will keep the out of pocket expense at $0.     [employer] ID: [id number]  I have spoken with the patient.   The billing information is as follows and has been shared with [place].    RxBin: [id number] PCN: [id number] Member ID: [id number] Group ID: [id number] Dates of Eligibility: 07/24/2019 through 07/22/2020  Fund: [place] Patient [place] Phone [phone] Fax [phone] 08/23/2019 4:34 PM

## 2019-08-23 NOTE — Assessment & Plan Note (Signed)
1.  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 -Abiraterone/prednisone from 12/02/2017 through 08/2018 with progression. -Cabazitaxel 10 cycles from 09/27/2018 through 06/07/2019 with progression. 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  No BRCA-related mutation detected. -Bone scan on 07/04/2019 showed progression with new bilateral rib lesions, posterior calvarium and left sacrum. -PSA on 06/07/2019 was 67.  Last [name] was on 06/07/2019. -Guardant 360 results show androgen receptor amplification indicating resistance to androgen receptor blockers.  Surprisingly he had ATM [id number] mutation. -Treatment options include olaparib which is active in this particular mutation, if we can get it approved.  Second best option would be bringing back docetaxel as he did not receive it in the metastatic setting.  Another option would be mitoxantrone.  Enzalutamide is unlikely to work given androgen receptor amplification on guardant 360. -Olaparib 300 mg twice daily started on 07/28/2019. -We reviewed results of the CT of the abdomen and pelvis with contrast dated 08/11/2019 which showed stable extensive bony metastatic disease.  Stable 4.8 cm infrarenal abdominal aortic aneurysm with extensive mural thrombus/soft plaque.  No visible meta stasis. -He was evaluated by Dr. Alyson Ingles for gross hematuria.  He was treated for UTI with Cefpodoxime for 7 days.  He was also started on Rapaflo 8 mg daily. -He reported improvement in hematuria.  He reported pains in the upper arm and forearms since the start of olaparib.  I think this is from myalgias related to olaparib.  He also had on and off pelvic discomfort which is improving since the start of the new pill. -I have reviewed his labs which are grossly unchanged.  2.  Mild thrombocytopenia: -Platelet count today is 123.  Likely from myelosuppression from  all operative.  We will keep a close eye on it.  3.  Bone metastasis: -Denosumab on hold since 12/21/2018 secondary to ONJ.  It will be held indefinitely.  4.  Back pain: -He is using fentanyl 75 mcg patch. -He is also using hydrocodone 10 mg 3 to 4 tablets/day.  Pain is well controlled on the current regimen.

## 2019-08-23 NOTE — Patient Instructions (Signed)
[facility] at [facility] Discharge Instructions  You were seen today by Dr. Katragadda. He went over your recent lab results. He will see you back in 2 weeks for labs and follow up.   Thank you for choosing [facility] at [facility] to provide your oncology and hematology care.  To afford each patient quality time with our provider, please arrive at least 15 minutes before your scheduled appointment time.   If you have a lab appointment with the Cancer Center please come in thru the  Main Entrance and check in at the main information desk  You need to re-schedule your appointment should you arrive 10 or more minutes late.  We strive to give you quality time with our providers, and arriving late affects you and other patients whose appointments are after yours.  Also, if you no show three or more times for appointments you may be dismissed from the clinic at the providers discretion.     Again, thank you for choosing [facility].  Our hope is that these requests will decrease the amount of time that you wait before being seen by our physicians.       _____________________________________________________________  Should you have questions after your visit to  Cancer Center, please contact our office at [phone] between the hours of 8:00 a.m. and 4:30 p.m.  Voicemails left after 4:00 p.m. will not be returned until the following business day.  For prescription refill requests, have your pharmacy contact our office and allow 72 hours.    Cancer Center Support Programs:   > Cancer Support Group  2nd Tuesday of the month 1pm-2pm, Journey Room    

## 2019-08-23 NOTE — Progress Notes (Signed)
Matthew [name], Matthew [name] [id number]   CLINIC:  Medical Oncology/Hematology  PCP:  [name], [place] [address] [phone]   REASON FOR VISIT:  Follow-up for prostate cancer   BRIEF ONCOLOGIC HISTORY:  Oncology History  Prostate carcinoma ([place])  12/10/2015 Imaging   CTA chest, multiple thoracic and rib osseous lesions. no primary evident. Atherosclerosis including aortic and CAD   12/12/2015 Imaging   CT abdomen/pelvis with sclerotic osseous lesions througout the lumbar spine, sacrum, bilateral iliac bones and L proximal femur worrisome for sclerotic osseous mets,mild prostatomegaly, no LAD, Infrarenal 3.8 cm AAA   12/12/2015 Tumor Marker   PSA 153.85   12/18/2015 Imaging   Bone Scan Diffuse bony metastatic disease, posterior calvarium, sternum, thoracic, lumbar spine, bilateral ribs, L shoulder, bilateral bony pelvis, proximal L femur   12/24/2015 Initial Biopsy   CT biopsy of L iliac bone lesion   12/26/2015 Pathology Results   Bone, biopsy, left iliac - POSITIVE FOR METASTATIC ADENOCARCINOMA.   12/28/2015 - 09/01/2016 Chemotherapy   Firmagon instituted 240 mg    01/01/2016 Procedure   Port placed by IR.   01/07/2016 - 04/11/2016 Chemotherapy   Docetaxel every 21 days x 6 cycles with dose reductions due to tolerance   02/18/2016 Adverse Reaction   GI toxicity from docetaxel, dose reduced to 60 mg/m2   02/18/2016 Treatment Plan Change   Docetaxel dose reduced by 20%   03/06/2016 Procedure   Dr. Enrique Sack- Multiple extraction of tooth numbers 3, 5, 6, 8, 9, 10, 11, 12, 20, 21, 22, 27, 28, and 31. 4 Quadrants of alveoloplasty. Bilateral mandibular lingual tori reductions.   04/09/2016 Imaging   Brain MRI 1. No intracranial metastatic disease 2. No acute intracranial abnormality 3. Findings of chronic microvascular disease   05/14/2016 Imaging   CT C/A/P Stable appearing diffuse bony metastatic disease. No new  significant disease is seen Innumerable patchy sclerotic osseous metastases throughout the axial and proximal appendicular skeleton, stable in size and distribution, significantly increased in density in the interval, likely reflecting treatment effect. 2. No new or progressive metastatic disease in the chest, abdomen or pelvis. 3. Bilateral perifissural pulmonary nodules are stable and probably benign. 4. Aortic atherosclerosis. Infrarenal 4.3 cm abdominal aortic aneurysm, minimally increased in size. Recommend followup by ultrasound in 1 year.   06/02/2016 Tumor Marker   PSA 0.99 ng/ml    - 07/31/2016 Radiation Therapy   Palliative XRT by Dr. Lianne Cure in [place].    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  The Multi-Gene Panel offered by [facility] includes sequencing and/or deletion duplication testing of the following 85 genes: AIP, ALK, APC, ATM, AXIN2,BAP1,  BARD1, BLM, BMPR1A, BRCA1, BRCA2, BRIP1, CASR, CDC73, CDH1, CDK4, CDKN1B, CDKN1C, CDKN2A (p14ARF), CDKN2A (p16INK4a), CEBPA, CHEK2, CTNNA1, DICER1, DIS3L2, EGFR (c.2369C>T, p.Thr790Met variant only), EPCAM (Deletion/duplication testing only), FH, FLCN, GATA2, GPC3, GREM1 (Promoter region deletion/duplication testing only), HOXB13 (c.251G>A, p.Gly84Glu), HRAS, KIT, MAX, MEN1, MET, MITF (c.952G>A, p.Glu318Lys variant only), MLH1, MSH2, MSH3, MSH6, MUTYH, NBN, NF1, NF2, NTHL1, PALB2, PDGFRA, PHOX2B, PMS2, POLD1, POLE, POT1, PRKAR1A, PTCH1, PTEN, RAD50, RAD51C, RAD51D, RB1, RECQL4, RET, RNF43, RUNX1, SDHAF2, SDHA (sequence changes only), SDHB, SDHC, SDHD, SMAD4, SMARCA4, SMARCB1, SMARCE1, STK11, SUFU, TERC, TERT, TMEM127, TP53, TSC1, TSC2, VHL, WRN and WT1.  The report date is August 24, 2018  This  is not thought to increase the risk for colon cancer or other cancers, unless there is the presence of two  pathogenic mutations.   09/27/2018 -  Chemotherapy   The patient had palonosetron (ALOXI) injection 0.25 mg, 0.25 mg, Intravenous,  Once, 10 of 10 cycles Administration: 0.25 mg (09/27/2018), 0.25 mg (10/25/2018), 0.25 mg (11/23/2018), 0.25 mg (12/21/2018), 0.25 mg (01/18/2019), 0.25 mg (02/15/2019), 0.25 mg (03/15/2019), 0.25 mg (04/12/2019), 0.25 mg (05/10/2019), 0.25 mg (06/07/2019) pegfilgrastim (NEULASTA ONPRO KIT) injection 6 mg, 6 mg, Subcutaneous, Once, 9 of 9 cycles Administration: 6 mg (10/25/2018), 6 mg (11/23/2018), 6 mg (12/21/2018), 6 mg (01/18/2019), 6 mg (02/15/2019), 6 mg (03/15/2019), 6 mg (04/12/2019), 6 mg (05/10/2019), 6 mg (06/07/2019) pegfilgrastim-cbqv (UDENYCA) injection 6 mg, 6 mg, Subcutaneous, Once, 1 of 1 cycle Administration: 6 mg (09/29/2018) cabazitaxel (JEVTANA) 35 mg in dextrose 5 % 250 mL chemo infusion, 15 mg/m2 = 35 mg (100 % of original dose 15 mg/m2), Intravenous,  Once, 10 of 10 cycles Dose modification: 15 mg/m2 (original dose 15 mg/m2, Cycle 1, Reason: Provider Judgment), 20 mg/m2 (original dose 15 mg/m2, Cycle 2, Reason: Provider Judgment), 20 mg/m2 (original dose 20 mg/m2, Cycle 5, Reason: Patient Age, Comment: Change in jevtana orderable), 15 mg/m2 (75 % of original dose 20 mg/m2, Cycle 8, Reason: Other (see comments), Comment: nausea, worsening numbness) Administration: 35 mg (09/27/2018), 46 mg (10/25/2018), 46 mg (11/23/2018), 46 mg (12/21/2018), 46 mg (01/18/2019), 46 mg (02/15/2019), 46 mg (03/15/2019), 35 mg (04/12/2019), 35 mg (05/10/2019), 35 mg (06/07/2019)  for chemotherapy treatment.    Bone metastases ([place])  12/26/2015 Initial Diagnosis   Bone metastases ([place])   09/27/2018 -  Chemotherapy   The patient had palonosetron (ALOXI) injection 0.25 mg, 0.25 mg, Intravenous,  Once, 4 of 6 cycles Administration: 0.25 mg (09/27/2018), 0.25 mg (10/25/2018), 0.25 mg (11/23/2018), 0.25 mg (12/21/2018) pegfilgrastim (NEULASTA ONPRO KIT) injection 6 mg, 6 mg, Subcutaneous, Once, 3 of 5 cycles  Administration: 6 mg (10/25/2018), 6 mg (11/23/2018), 6 mg (12/21/2018) pegfilgrastim-cbqv (UDENYCA) injection 6 mg, 6 mg, Subcutaneous, Once, 1 of 1 cycle Administration: 6 mg (09/29/2018) cabazitaxel (JEVTANA) 35 mg in dextrose 5 % 250 mL chemo infusion, 15 mg/m2 = 35 mg (100 % of original dose 15 mg/m2), Intravenous,  Once, 4 of 6 cycles Dose modification: 15 mg/m2 (original dose 15 mg/m2, Cycle 1, Reason: Provider Judgment), 20 mg/m2 (original dose 15 mg/m2, Cycle 2, Reason: Provider Judgment) Administration: 35 mg (09/27/2018), 46 mg (10/25/2018), 46 mg (11/23/2018), 46 mg (12/21/2018)  for chemotherapy treatment.       CANCER STAGING: Cancer Staging Prostate carcinoma [facility]) Staging form: Prostate, AJCC 7th Edition - Clinical stage from 12/24/2015: Stage IV (TX, N0, M1b, PSA: 20 or greater) - Signed by Baird Cancer, PA-C on 01/07/2016    INTERVAL HISTORY:  Mr. Selvey 64 y.o. male seen for follow-up of metastatic prostate cancer to the bones.  He reports that he started on the olaparib twice daily on 07/28/2019.  Denied any nausea, vomiting or diarrhea.  Baseline constipation is stable.  Pains get worse now and then.  He requests refill for fentanyl patch.  Numbness in the toes has been stable.  Appetite and energy levels are 50%.  He had episode of hematuria for which she was seen by Dr. Alyson Ingles on 07/27/2019 and he was given antibiotic for 7 days along with a pill for BPH.  REVIEW OF SYSTEMS:  Review of Systems  Respiratory: Positive for shortness of breath.   Gastrointestinal: Positive for constipation.  Neurological: Positive for numbness.  Psychiatric/Behavioral: Positive for sleep disturbance.  All other systems reviewed and are negative.    PAST MEDICAL/SURGICAL HISTORY:  Past Medical History:  Diagnosis Date  . Arthritis    Left knee  . Bone cancer ([place])   . Bone metastases ([place]) 12/26/2015  . Cancer [facility])    Prostate  . DVT (deep venous thrombosis) ([place]) 2004  . Family  history of prostate cancer   . Family history of stomach cancer   . Kidney stones    ~2002  . Prostate carcinoma ([place]) 12/26/2015   Past Surgical History:  Procedure Laterality Date  . CIRCUMCISION  30 years ago  . MULTIPLE EXTRACTIONS WITH ALVEOLOPLASTY N/A 03/06/2016   Procedure: Extraction of tooth #'s 3,5,6,8-12,20-22, 27, 28, and 31 with alveoloplasty and bilateral mandibular tori reductions;  Surgeon: Lenn Cal, DDS;  Location: [facility];  Service: Oral Surgery;  Laterality: N/A;     SOCIAL HISTORY:  Social History   Socioeconomic History  . Marital status: Single    Spouse name: Not on file  . Number of children: 2  . Years of education: Not on file  . Highest education level: Not on file  Occupational History  . Occupation: Construction/carpenter  Tobacco Use  . Smoking status: Current Every Day Smoker    Packs/day: 1.00    Years: 50.00    Pack years: 50.00    Types: Cigarettes    Start date: 06/16/1965  . Smokeless tobacco: Former Systems developer    Types: [place] date: 08/04/2005  Substance and Sexual Activity  . Alcohol use: No    Alcohol/week: 0.0 standard drinks  . Drug use: Yes    Types: Marijuana    Comment: THC for appetite; occ  . Sexual activity: Not on file  Other Topics Concern  . Not on file  Social History Narrative  . Not on file   Social Determinants of Health   Financial Resource Strain:   . Difficulty of Paying Living Expenses: Not on file  Food Insecurity:   . Worried About Charity fundraiser in the Last Year: Not on file  . Ran Out of Food in the Last Year: Not on file  Transportation Needs:   . Lack of Transportation (Medical): Not on file  . Lack of Transportation (Non-Medical): Not on file  Physical Activity:   . Days of Exercise per Week: Not on file  . Minutes of Exercise per Session: Not on file  Stress:   . Feeling of Stress : Not on file  Social Connections:   . Frequency of Communication with Friends and Family: Not on file   . Frequency of Social Gatherings with Friends and Family: Not on file  . Attends Religious Services: Not on file  . Active Member of Clubs or Organizations: Not on file  . Attends Archivist Meetings: Not on file  . Marital Status: Not on file  Intimate Partner Violence:   . Fear of Current or Ex-Partner: Not on file  . Emotionally Abused: Not on file  . Physically Abused: Not on file  . Sexually Abused: Not on file    FAMILY HISTORY:  Family History  Problem Relation Age of Onset  . Stomach cancer Mother        dx 44s  . Aneurysm Maternal Aunt        brain  . Alcohol abuse Maternal Uncle   . Stomach cancer Maternal Grandmother  dx 50s-60s  . Prostate cancer Maternal Uncle        dx 81s    CURRENT MEDICATIONS:  Outpatient Encounter Medications as of 08/23/2019  Medication Sig Note  . Calcium Carb-Cholecalciferol (CALCIUM 500 + D3) 500-600 MG-UNIT TABS Take 2 tablets by mouth daily.   . ergocalciferol (VITAMIN D2) 50000 units capsule Take 1 capsule (50,000 Units total) by mouth every Friday.   . fentaNYL (DURAGESIC) 75 MCG/HR Place 1 patch onto the skin every 3 (three) days.   Marland Kitchen omeprazole (PRILOSEC) 20 MG capsule Take 1 capsule (20 mg total) by mouth daily.   . silodosin (RAPAFLO) 8 MG CAPS capsule Take 1 capsule (8 mg total) by mouth daily with breakfast.   . traZODone (DESYREL) 100 MG tablet Take 1 tablet (100 mg total) by mouth at bedtime as needed for sleep.   . Wheat Dextrin (BENEFIBER PO) Take 1 Package by mouth as needed.    . [DISCONTINUED] fentaNYL (DURAGESIC) 75 MCG/HR Place 1 patch onto the skin every 3 (three) days.   . [DISCONTINUED] olaparib (LYNPARZA) 150 MG tablet Take 2 tablets (300 mg total) by mouth 2 (two) times daily. Swallow whole. May take with food to decrease nausea and vomiting.   . docusate sodium (COLACE) 100 MG capsule Take 2 capsules daily to help soften the stool.  It this does not work, you can increase to 2 capsules in the  morning and 2 capsules in the evening.  If you do not have a bowel movement please call the [facility]. (Patient not taking: Reported on 07/18/2019)   . HYDROcodone-acetaminophen (NORCO) 10-325 MG tablet Take 1 tablet by mouth every 6 (six) hours as needed. (Patient not taking: Reported on 08/23/2019)   . lidocaine-prilocaine (EMLA) cream Apply a quarter size amount to port site 1 hour prior to chemo. Do not rub in. Cover with plastic wrap. (Patient not taking: Reported on 06/07/2019)   . NARCAN 4 MG/0.1ML LIQD nasal spray kit Place 1 spray into the nose as needed.   . ondansetron (ZOFRAN) 8 MG tablet TAKE 1 TABLET BY MOUTH EVERY 8 HOURS AS NEEDED FOR NAUSEA FOR VOMITING (Patient not taking: Reported on 08/23/2019)   . prochlorperazine (COMPAZINE) 10 MG tablet Take 1 tablet (10 mg total) by mouth every 6 (six) hours as needed for nausea or vomiting. (Patient not taking: Reported on 06/27/2019)   . Pseudoeph-Chlorphen-DM (CHILDRENS NYQUIL COLD/COUGH PO) Take 15 mLs by mouth at bedtime.   . [DISCONTINUED] cefpodoxime (VANTIN) 200 MG tablet Take 1 tablet (200 mg total) by mouth 2 (two) times daily.   . [DISCONTINUED] chlorhexidine (PERIDEX) 0.12 % solution Rinse with 15 mls twice daily for 30 seconds. Spit out excess. Do not swallow. (Patient not taking: Reported on 07/06/2019)   . [DISCONTINUED] predniSONE (DELTASONE) 10 MG tablet Take 1 tablet (10 mg total) by mouth daily. (Patient not taking: Reported on 07/18/2019) 07/08/2019: Patient is out of medication.   Facility-Administered Encounter Medications as of 08/23/2019  Medication  . sodium chloride flush (NS) 0.9 % injection 10 mL    ALLERGIES:  Allergies  Allergen Reactions  . Meloxicam Other (See Comments)    Caused stomach bleeding.     PHYSICAL EXAM:  ECOG Performance status: 1  Vitals:   08/23/19 1048  BP: 101/87  Pulse: (!) 101  Resp: 18  Temp: (!) 97.1 F (36.2 C)  SpO2: 100%   Filed Weights   08/23/19 1048   Weight: 229 lb 8 oz (104.1 kg)  Physical Exam Vitals reviewed.  Constitutional:      Appearance: Normal appearance.  Cardiovascular:     Rate and Rhythm: Normal rate and regular rhythm.     Heart sounds: Normal heart sounds.  Pulmonary:     Effort: Pulmonary effort is normal.     Breath sounds: Normal breath sounds.  Abdominal:     General: There is no distension.     Palpations: Abdomen is soft. There is no mass.  Musculoskeletal:        General: No swelling.  Skin:    General: Skin is warm.  Neurological:     General: No focal deficit present.     Mental Status: He is alert and oriented to person, place, and time.  Psychiatric:        Mood and Affect: Mood normal.        Behavior: Behavior normal.      LABORATORY DATA:  I have reviewed the labs as listed.  CBC    Component Value Date/Time   WBC 4.0 08/23/2019 1159   RBC 3.93 (L) 08/23/2019 1159   HGB 12.0 (L) 08/23/2019 1159   HCT 39.7 08/23/2019 1159   PLT 123 (L) 08/23/2019 1159   MCV 101.0 (H) 08/23/2019 1159   MCH 30.5 08/23/2019 1159   MCHC 30.2 08/23/2019 1159   RDW 16.2 (H) 08/23/2019 1159   LYMPHSABS 0.8 08/23/2019 1159   MONOABS 0.4 08/23/2019 1159   EOSABS 0.1 08/23/2019 1159   BASOSABS 0.0 08/23/2019 1159   CMP Latest Ref Rng & Units 08/23/2019 08/11/2019 07/08/2019  Glucose 70 - 99 mg/dL 109(H) 98 97  BUN 8 - 23 mg/dL _0 Creatinine 0.61 - 1.24 mg/dL 1.21 1.28(H) 1.01  Sodium 135 - 145 mmol/L 139 139 139  Potassium 3.5 - 5.1 mmol/L 3.3(L) 3.9 3.0(L)  Chloride 98 - 111 mmol/L 107 102 111  CO2 22 - 32 mmol/L 24 25 19(L)  Calcium 8.9 - 10.3 mg/dL 9.0 9.6 7.2(L)  Total Protein 6.5 - 8.1 g/dL 6.5 6.7 -  Total Bilirubin 0.3 - 1.2 mg/dL 0.4 0.6 -  Alkaline Phos 38 - 126 U/L 82 91 -  AST 15 - 41 U/L 13(L) 16 -  ALT 0 - 44 U/L 13 19 -       DIAGNOSTIC IMAGING:  I have independently reviewed the scans and discussed with the patient.   I have reviewed Venita Lick LPN's note and agree  with the documentation.  I personally performed a face-to-face visit, made revisions and my assessment and plan is as follows.    ASSESSMENT & PLAN:   Prostate carcinoma ([place]) 1.  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 -Abiraterone/prednisone from 12/02/2017 through 08/2018 with progression. -Cabazitaxel 10 cycles from 09/27/2018 through 06/07/2019 with progression. 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  No BRCA-related mutation detected. -Bone scan on 07/04/2019 showed progression with new bilateral rib lesions, posterior calvarium and left sacrum. -PSA on 06/07/2019 was 67.  Last [name] was on 06/07/2019. -Guardant 360 results show androgen receptor amplification indicating resistance to androgen receptor blockers.  Surprisingly he had ATM [id number] mutation. -Treatment options include olaparib which is active in this particular mutation, if we can get it approved.  Second best option would be bringing back docetaxel as he did not receive it in the metastatic setting.  Another option would be mitoxantrone.  Enzalutamide is unlikely to work given androgen receptor amplification on  guardant 360. -Olaparib 300 mg twice daily started on 07/28/2019. -We reviewed results of the CT of the abdomen and pelvis with contrast dated 08/11/2019 which showed stable extensive bony metastatic disease.  Stable 4.8 cm infrarenal abdominal aortic aneurysm with extensive mural thrombus/soft plaque.  No visible meta stasis. -He was evaluated by Dr. Alyson Ingles for gross hematuria.  He was treated for UTI with Cefpodoxime for 7 days.  He was also started on Rapaflo 8 mg daily. -He reported improvement in hematuria.  He reported pains in the upper arm and forearms since the start of olaparib.  I think this is from myalgias related to olaparib.  He also had on and off pelvic discomfort which is improving  since the start of the new pill. -I have reviewed his labs which are grossly unchanged.  2.  Mild thrombocytopenia: -Platelet count today is 123.  Likely from myelosuppression from all operative.  We will keep a close eye on it.  3.  Bone metastasis: -Denosumab on hold since 12/21/2018 secondary to ONJ.  It will be held indefinitely.  4.  Back pain: -He is using fentanyl 75 mcg patch. -He is also using hydrocodone 10 mg 3 to 4 tablets/day.  Pain is well controlled on the current regimen.  Total time spent is 30 minutes with more than 50% of the time spent face-to-face discussing treatment plan, reviewing scans and records, counseling and coordination of care.    Orders placed this encounter:  No orders of the defined types were placed in this encounter.     Derek Jack, MD [place] [phone]

## 2019-08-24 ENCOUNTER — Telehealth (HOSPITAL_COMMUNITY): Payer: Self-pay | Admitting: Pharmacist

## 2019-08-24 NOTE — Telephone Encounter (Signed)
Oral Chemotherapy Pharmacist Encounter   Attempted to reach patient to provide update and offer for counseling on oral medication: Lynparza (olaparib).  No answer.  Left voicemail for patient to call back to discuss details of medication acquisition and counseling session.  Leron Croak, PharmD, [place] PGY2 Hematology/Oncology Pharmacy Resident 08/24/2019 1:56 PM Oral Oncology Clinic [phone]

## 2019-08-25 MED FILL — LYNPARZA 150 MG TABLET: 150 | 30 days supply | Qty: 120 | Fill #0

## 2019-08-25 NOTE — Telephone Encounter (Addendum)
Oral Chemotherapy Pharmacist Encounter  I spoke with patient's daughter, Matthew [name], today for overview of: Matthew [name] for the treatment of metastatic castrate resistant prostate cancer with ATM [id number] mutation in conjunction with [name], planned duration until disease progression or unacceptable toxicity.   Counseled on administration, dosing, side effects, monitoring, drug-food interactions, safe handling, storage, and disposal.  Patient will take Lynparza 129m tablets, 2 tablets (3035m by mouth 2 times daily without regard to food.  Patient counseled that administering with food may increase tolerability.  Patient knows to avoid grapefruit or grapefruit juice while on therapy with Lynparza.  LyLonie Peaktart date: 07/28/19   Adverse effects include but are not limited to: nausea, vomiting, diarrhea, taste changes, mouth sores, fatigue, constipation, decreased blood counts, and joint pain.  Patient informed that pneumonitis (including some fatalities) has occurred rarely. Myelodysplastic syndrome/acute myeloid leukemia (MDS/AML) have been reported (rarely) in clinical trials.  Patient has anti-emetic on hand and knows to take it if nausea develops.   Patient will obtain anti diarrheal and alert the office of 4 or more loose stools above baseline.  Reviewed with patient importance of keeping a medication schedule and plan for any missed doses.  Medication reconciliation performed and medication/allergy list updated.  All questions answered.  Matthew [name] voiced understanding and appreciation. Medication handout and contact information will be sent to patient's home address.  Patient and family know to call the office with questions or concerns.  ReLeron CroakPharmD, BCGryglaGY2 Hematology/Oncology Pharmacy Resident 08/25/2019 11:59 AM Oral Oncology Clinic [phone]

## 2019-09-05 ENCOUNTER — Other Ambulatory Visit (HOSPITAL_COMMUNITY): Payer: Self-pay

## 2019-09-05 DIAGNOSIS — G893 Neoplasm related pain (acute) (chronic): Secondary | ICD-10-CM

## 2019-09-05 DIAGNOSIS — C61 Malignant neoplasm of prostate: Secondary | ICD-10-CM

## 2019-09-05 DIAGNOSIS — C7951 Secondary malignant neoplasm of bone: Secondary | ICD-10-CM

## 2019-09-05 MED ORDER — HYDROCODONE-ACETAMINOPHEN 10-325 MG PO TABS: 1.0000 | ORAL_TABLET | Freq: Four times a day (QID) | ORAL | 0 refills | Status: DC | PRN

## 2019-09-07 ENCOUNTER — Ambulatory Visit (HOSPITAL_COMMUNITY): Payer: Medicare Other | Admitting: Hematology

## 2019-09-07 ENCOUNTER — Inpatient Hospital Stay (HOSPITAL_COMMUNITY): Payer: Medicare Other

## 2019-09-07 DIAGNOSIS — N401 Enlarged prostate with lower urinary tract symptoms: Secondary | ICD-10-CM | POA: Insufficient documentation

## 2019-09-07 DIAGNOSIS — N138 Other obstructive and reflux uropathy: Secondary | ICD-10-CM | POA: Insufficient documentation

## 2019-09-07 DIAGNOSIS — R350 Frequency of micturition: Secondary | ICD-10-CM | POA: Insufficient documentation

## 2019-09-08 ENCOUNTER — Telehealth: Payer: Self-pay | Admitting: Urology

## 2019-09-08 NOTE — Telephone Encounter (Signed)
Keat w/ [employer] dept called and requests the nurse return her call at [phone] regarding this pts medication.

## 2019-09-13 NOTE — Telephone Encounter (Signed)
Called number back. No answer. Left message.

## 2019-09-13 NOTE — Telephone Encounter (Signed)
Spoke with Keta with [facility]. Verified pt tried flomax with no improvement. Vanna Scotland will forward on the pharmacist

## 2019-09-13 NOTE — Telephone Encounter (Signed)
Matthew [name] called back several times yesterday and again this morning. She states if they dont get clinical info by today the request will be denied.

## 2019-09-21 ENCOUNTER — Inpatient Hospital Stay (HOSPITAL_COMMUNITY): Payer: Medicare Other | Attending: Hematology

## 2019-09-21 ENCOUNTER — Inpatient Hospital Stay (HOSPITAL_BASED_OUTPATIENT_CLINIC_OR_DEPARTMENT_OTHER): Payer: Medicare Other | Admitting: Hematology

## 2019-09-21 ENCOUNTER — Other Ambulatory Visit: Payer: Self-pay

## 2019-09-21 VITALS — BP 100/67 | HR 112 | Temp 97.1°F | Resp 16 | Wt 225.8 lb

## 2019-09-21 DIAGNOSIS — G893 Neoplasm related pain (acute) (chronic): Secondary | ICD-10-CM

## 2019-09-21 DIAGNOSIS — M25512 Pain in left shoulder: Secondary | ICD-10-CM | POA: Diagnosis not present

## 2019-09-21 DIAGNOSIS — Z86718 Personal history of other venous thrombosis and embolism: Secondary | ICD-10-CM | POA: Insufficient documentation

## 2019-09-21 DIAGNOSIS — M25511 Pain in right shoulder: Secondary | ICD-10-CM | POA: Diagnosis not present

## 2019-09-21 DIAGNOSIS — C61 Malignant neoplasm of prostate: Secondary | ICD-10-CM

## 2019-09-21 DIAGNOSIS — C7951 Secondary malignant neoplasm of bone: Secondary | ICD-10-CM

## 2019-09-21 DIAGNOSIS — Z79899 Other long term (current) drug therapy: Secondary | ICD-10-CM | POA: Insufficient documentation

## 2019-09-21 DIAGNOSIS — Z8042 Family history of malignant neoplasm of prostate: Secondary | ICD-10-CM | POA: Insufficient documentation

## 2019-09-21 DIAGNOSIS — F17210 Nicotine dependence, cigarettes, uncomplicated: Secondary | ICD-10-CM | POA: Diagnosis not present

## 2019-09-21 DIAGNOSIS — Z800 Family history of malignant neoplasm of digestive organs: Secondary | ICD-10-CM | POA: Insufficient documentation

## 2019-09-21 DIAGNOSIS — M545 Low back pain: Secondary | ICD-10-CM | POA: Insufficient documentation

## 2019-09-21 LAB — COMPREHENSIVE METABOLIC PANEL
ALT: 11 U/L (ref 0–44)
AST: 16 U/L (ref 15–41)
Albumin: 3.6 g/dL (ref 3.5–5.0)
Alkaline Phosphatase: 92 U/L (ref 38–126)
Anion gap: 12 (ref 5–15)
BUN: 9 mg/dL (ref 8–23)
CO2: 24 mmol/L (ref 22–32)
Calcium: 9.6 mg/dL (ref 8.9–10.3)
Chloride: 104 mmol/L (ref 98–111)
Creatinine, Ser: 1.21 mg/dL (ref 0.61–1.24)
GFR calc Af Amer: >60 mL/min (ref >60)
GFR calc non Af Amer: >60 mL/min (ref >60)
Glucose, Bld: 120 mg/dL — ABNORMAL HIGH (ref 70–99)
Potassium: 3.5 mmol/L (ref 3.5–5.1)
Sodium: 140 mmol/L (ref 135–145)
Total Bilirubin: 0.5 mg/dL (ref 0.3–1.2)
Total Protein: 6.5 g/dL (ref 6.5–8.1)

## 2019-09-21 LAB — CBC WITH DIFFERENTIAL/PLATELET
Abs Immature Granulocytes: 0.01 10*3/uL (ref 0.00–0.07)
Basophils Absolute: 0 10*3/uL (ref 0.0–0.1)
Basophils Relative: 1 %
Eosinophils Absolute: 0.1 10*3/uL (ref 0.0–0.5)
Eosinophils Relative: 2 %
HCT: 35.6 % — ABNORMAL LOW (ref 39.0–52.0)
Hemoglobin: 11.9 g/dL — ABNORMAL LOW (ref 13.0–17.0)
Immature Granulocytes: 0 %
Lymphocytes Relative: 15 %
Lymphs Abs: 0.7 10*3/uL (ref 0.7–4.0)
MCH: 31.3 pg (ref 26.0–34.0)
MCHC: 33.4 g/dL (ref 30.0–36.0)
MCV: 93.7 fL (ref 80.0–100.0)
Monocytes Absolute: 0.3 10*3/uL (ref 0.1–1.0)
Monocytes Relative: 7 %
Neutro Abs: 3.4 10*3/uL (ref 1.7–7.7)
Neutrophils Relative %: 75 %
Platelets: 185 10*3/uL (ref 150–400)
RBC: 3.8 MIL/uL — ABNORMAL LOW (ref 4.22–5.81)
RDW: 18.4 % — ABNORMAL HIGH (ref 11.5–15.5)
WBC: 4.5 10*3/uL (ref 4.0–10.5)
nRBC: 0.4 % — ABNORMAL HIGH (ref 0.0–0.2)

## 2019-09-21 MED ORDER — HYDROCODONE-ACETAMINOPHEN 10-325 MG PO TABS: 1.0000 | ORAL_TABLET | Freq: Four times a day (QID) | ORAL | 0 refills | Status: DC | PRN

## 2019-09-21 NOTE — Progress Notes (Signed)
Matthew [name], Matthew [name] [id number]   CLINIC:  Medical Oncology/Hematology  PCP:  [name], [place] [address] [phone]   REASON FOR VISIT:  Follow-up for prostate cancer   BRIEF ONCOLOGIC HISTORY:  Oncology History  Prostate carcinoma ([place])  12/10/2015 Imaging   CTA chest, multiple thoracic and rib osseous lesions. no primary evident. Atherosclerosis including aortic and CAD   12/12/2015 Imaging   CT abdomen/pelvis with sclerotic osseous lesions througout the lumbar spine, sacrum, bilateral iliac bones and L proximal femur worrisome for sclerotic osseous mets,mild prostatomegaly, no LAD, Infrarenal 3.8 cm AAA   12/12/2015 Tumor Marker   PSA 153.85   12/18/2015 Imaging   Bone Scan Diffuse bony metastatic disease, posterior calvarium, sternum, thoracic, lumbar spine, bilateral ribs, L shoulder, bilateral bony pelvis, proximal L femur   12/24/2015 Initial Biopsy   CT biopsy of L iliac bone lesion   12/26/2015 Pathology Results   Bone, biopsy, left iliac - POSITIVE FOR METASTATIC ADENOCARCINOMA.   12/28/2015 - 09/01/2016 Chemotherapy   Firmagon instituted 240 mg    01/01/2016 Procedure   Port placed by IR.   01/07/2016 - 04/11/2016 Chemotherapy   Docetaxel every 21 days x 6 cycles with dose reductions due to tolerance   02/18/2016 Adverse Reaction   GI toxicity from docetaxel, dose reduced to 60 mg/m2   02/18/2016 Treatment Plan Change   Docetaxel dose reduced by 20%   03/06/2016 Procedure   Dr. Enrique Sack- Multiple extraction of tooth numbers 3, 5, 6, 8, 9, 10, 11, 12, 20, 21, 22, 27, 28, and 31. 4 Quadrants of alveoloplasty. Bilateral mandibular lingual tori reductions.   04/09/2016 Imaging   Brain MRI 1. No intracranial metastatic disease 2. No acute intracranial abnormality 3. Findings of chronic microvascular disease   05/14/2016 Imaging   CT C/A/P Stable appearing diffuse bony metastatic disease. No new  significant disease is seen Innumerable patchy sclerotic osseous metastases throughout the axial and proximal appendicular skeleton, stable in size and distribution, significantly increased in density in the interval, likely reflecting treatment effect. 2. No new or progressive metastatic disease in the chest, abdomen or pelvis. 3. Bilateral perifissural pulmonary nodules are stable and probably benign. 4. Aortic atherosclerosis. Infrarenal 4.3 cm abdominal aortic aneurysm, minimally increased in size. Recommend followup by ultrasound in 1 year.   06/02/2016 Tumor Marker   PSA 0.99 ng/ml    - 07/31/2016 Radiation Therapy   Palliative XRT by Dr. Lianne Cure in [place].    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  The Multi-Gene Panel offered by [facility] includes sequencing and/or deletion duplication testing of the following 85 genes: AIP, ALK, APC, ATM, AXIN2,BAP1,  BARD1, BLM, BMPR1A, BRCA1, BRCA2, BRIP1, CASR, CDC73, CDH1, CDK4, CDKN1B, CDKN1C, CDKN2A (p14ARF), CDKN2A (p16INK4a), CEBPA, CHEK2, CTNNA1, DICER1, DIS3L2, EGFR (c.2369C>T, p.Thr790Met variant only), EPCAM (Deletion/duplication testing only), FH, FLCN, GATA2, GPC3, GREM1 (Promoter region deletion/duplication testing only), HOXB13 (c.251G>A, p.Gly84Glu), HRAS, KIT, MAX, MEN1, MET, MITF (c.952G>A, p.Glu318Lys variant only), MLH1, MSH2, MSH3, MSH6, MUTYH, NBN, NF1, NF2, NTHL1, PALB2, PDGFRA, PHOX2B, PMS2, POLD1, POLE, POT1, PRKAR1A, PTCH1, PTEN, RAD50, RAD51C, RAD51D, RB1, RECQL4, RET, RNF43, RUNX1, SDHAF2, SDHA (sequence changes only), SDHB, SDHC, SDHD, SMAD4, SMARCA4, SMARCB1, SMARCE1, STK11, SUFU, TERC, TERT, TMEM127, TP53, TSC1, TSC2, VHL, WRN and WT1.  The report date is August 24, 2018  This  is not thought to increase the risk for colon cancer or other cancers, unless there is the presence of two  pathogenic mutations.   09/27/2018 -  Chemotherapy   The patient had palonosetron (ALOXI) injection 0.25 mg, 0.25 mg, Intravenous,  Once, 10 of 10 cycles Administration: 0.25 mg (09/27/2018), 0.25 mg (10/25/2018), 0.25 mg (11/23/2018), 0.25 mg (12/21/2018), 0.25 mg (01/18/2019), 0.25 mg (02/15/2019), 0.25 mg (03/15/2019), 0.25 mg (04/12/2019), 0.25 mg (05/10/2019), 0.25 mg (06/07/2019) pegfilgrastim (NEULASTA ONPRO KIT) injection 6 mg, 6 mg, Subcutaneous, Once, 9 of 9 cycles Administration: 6 mg (10/25/2018), 6 mg (11/23/2018), 6 mg (12/21/2018), 6 mg (01/18/2019), 6 mg (02/15/2019), 6 mg (03/15/2019), 6 mg (04/12/2019), 6 mg (05/10/2019), 6 mg (06/07/2019) pegfilgrastim-cbqv (UDENYCA) injection 6 mg, 6 mg, Subcutaneous, Once, 1 of 1 cycle Administration: 6 mg (09/29/2018) cabazitaxel (JEVTANA) 35 mg in dextrose 5 % 250 mL chemo infusion, 15 mg/m2 = 35 mg (100 % of original dose 15 mg/m2), Intravenous,  Once, 10 of 10 cycles Dose modification: 15 mg/m2 (original dose 15 mg/m2, Cycle 1, Reason: Provider Judgment), 20 mg/m2 (original dose 15 mg/m2, Cycle 2, Reason: Provider Judgment), 20 mg/m2 (original dose 20 mg/m2, Cycle 5, Reason: Patient Age, Comment: Change in jevtana orderable), 15 mg/m2 (75 % of original dose 20 mg/m2, Cycle 8, Reason: Other (see comments), Comment: nausea, worsening numbness) Administration: 35 mg (09/27/2018), 46 mg (10/25/2018), 46 mg (11/23/2018), 46 mg (12/21/2018), 46 mg (01/18/2019), 46 mg (02/15/2019), 46 mg (03/15/2019), 35 mg (04/12/2019), 35 mg (05/10/2019), 35 mg (06/07/2019)  for chemotherapy treatment.    Bone metastases ([place])  12/26/2015 Initial Diagnosis   Bone metastases ([place])   09/27/2018 -  Chemotherapy   The patient had palonosetron (ALOXI) injection 0.25 mg, 0.25 mg, Intravenous,  Once, 4 of 6 cycles Administration: 0.25 mg (09/27/2018), 0.25 mg (10/25/2018), 0.25 mg (11/23/2018), 0.25 mg (12/21/2018) pegfilgrastim (NEULASTA ONPRO KIT) injection 6 mg, 6 mg, Subcutaneous, Once, 3 of 5 cycles  Administration: 6 mg (10/25/2018), 6 mg (11/23/2018), 6 mg (12/21/2018) pegfilgrastim-cbqv (UDENYCA) injection 6 mg, 6 mg, Subcutaneous, Once, 1 of 1 cycle Administration: 6 mg (09/29/2018) cabazitaxel (JEVTANA) 35 mg in dextrose 5 % 250 mL chemo infusion, 15 mg/m2 = 35 mg (100 % of original dose 15 mg/m2), Intravenous,  Once, 4 of 6 cycles Dose modification: 15 mg/m2 (original dose 15 mg/m2, Cycle 1, Reason: Provider Judgment), 20 mg/m2 (original dose 15 mg/m2, Cycle 2, Reason: Provider Judgment) Administration: 35 mg (09/27/2018), 46 mg (10/25/2018), 46 mg (11/23/2018), 46 mg (12/21/2018)  for chemotherapy treatment.       CANCER STAGING: Cancer Staging Prostate carcinoma [facility]) Staging form: Prostate, AJCC 7th Edition - Clinical stage from 12/24/2015: Stage IV (TX, N0, M1b, PSA: 20 or greater) - Signed by Baird Cancer, PA-C on 01/07/2016    INTERVAL HISTORY:  Mr. Pargas 64 y.o. male seen for follow-up of metastatic prostate cancer to the bones.  He is taking olaparib 300 mg twice daily.  Appetite is 50%.  Energy levels are 50%.  Reports pain in the back region and bilateral shoulders and lower back.  He is using fentanyl 75 mcg patch.  He is taking hydrocodone 3 to 4 tablets daily.  Pain is poorly controlled.  He thinks the cold weather is worsening his pain.  Shortness of breath on exertion is present.  REVIEW OF SYSTEMS:  Review of Systems  Respiratory: Positive for shortness of breath.   Gastrointestinal: Positive for constipation.  Neurological: Positive for numbness.  Psychiatric/Behavioral: Positive for sleep disturbance.  All other systems reviewed and are negative.    PAST MEDICAL/SURGICAL HISTORY:  Past Medical History:  Diagnosis Date  . Arthritis    Left knee  . Bone cancer ([place])   . Bone metastases ([place]) 12/26/2015  . Cancer [facility])    Prostate  . DVT (deep venous thrombosis) ([place]) 2004  . Family history of prostate cancer   . Family history of stomach cancer   .  Kidney stones    ~2002  . Prostate carcinoma ([place]) 12/26/2015   Past Surgical History:  Procedure Laterality Date  . CIRCUMCISION  30 years ago  . MULTIPLE EXTRACTIONS WITH ALVEOLOPLASTY N/A 03/06/2016   Procedure: Extraction of tooth #'s 3,5,6,8-12,20-22, 27, 28, and 31 with alveoloplasty and bilateral mandibular tori reductions;  Surgeon: Lenn Cal, DDS;  Location: [place];  Service: Oral Surgery;  Laterality: N/A;     SOCIAL HISTORY:  Social History   Socioeconomic History  . Marital status: Single    Spouse name: Not on file  . Number of children: 2  . Years of education: Not on file  . Highest education level: Not on file  Occupational History  . Occupation: Construction/carpenter  Tobacco Use  . Smoking status: Current Every Day Smoker    Packs/day: 1.00    Years: 50.00    Pack years: 50.00    Types: Cigarettes    Start date: 06/16/1965  . Smokeless tobacco: Former Systems developer    Types: [place] date: 08/04/2005  Substance and Sexual Activity  . Alcohol use: No    Alcohol/week: 0.0 standard drinks  . Drug use: Yes    Types: Marijuana    Comment: THC for appetite; occ  . Sexual activity: Not on file  Other Topics Concern  . Not on file  Social History Narrative  . Not on file   Social Determinants of Health   Financial Resource Strain:   . Difficulty of Paying Living Expenses: Not on file  Food Insecurity:   . Worried About Charity fundraiser in the Last Year: Not on file  . Ran Out of Food in the Last Year: Not on file  Transportation Needs:   . Lack of Transportation (Medical): Not on file  . Lack of Transportation (Non-Medical): Not on file  Physical Activity:   . Days of Exercise per Week: Not on file  . Minutes of Exercise per Session: Not on file  Stress:   . Feeling of Stress : Not on file  Social Connections:   . Frequency of Communication with Friends and Family: Not on file  . Frequency of Social Gatherings with Friends and Family: Not on  file  . Attends Religious Services: Not on file  . Active Member of Clubs or Organizations: Not on file  . Attends Archivist Meetings: Not on file  . Marital Status: Not on file  Intimate Partner Violence:   . Fear of Current or Ex-Partner: Not on file  . Emotionally Abused: Not on file  . Physically Abused: Not on file  . Sexually Abused: Not on file    FAMILY HISTORY:  Family History  Problem Relation Age of Onset  . Stomach cancer Mother        dx 79s  . Aneurysm Maternal Aunt        brain  . Alcohol abuse Maternal Uncle   . Stomach cancer Maternal Grandmother        dx 50s-60s  . Prostate cancer Maternal Uncle  dx 25s    CURRENT MEDICATIONS:  Outpatient Encounter Medications as of 09/21/2019  Medication Sig  . Calcium Carb-Cholecalciferol (CALCIUM 500 + D3) 500-600 MG-UNIT TABS Take 2 tablets by mouth daily.  . ergocalciferol (VITAMIN D2) 50000 units capsule Take 1 capsule (50,000 Units total) by mouth every Friday.  . fentaNYL (DURAGESIC) 75 MCG/HR Place 1 patch onto the skin every 3 (three) days.  Marland Kitchen olaparib (LYNPARZA) 150 MG tablet Take 2 tablets (300 mg total) by mouth 2 (two) times daily. Swallow whole. May take with food to decrease nausea and vomiting.  Marland Kitchen omeprazole (PRILOSEC) 20 MG capsule Take 1 capsule (20 mg total) by mouth daily.  . Pseudoeph-Chlorphen-DM (CHILDRENS NYQUIL COLD/COUGH PO) Take 15 mLs by mouth at bedtime.  . silodosin (RAPAFLO) 8 MG CAPS capsule Take 1 capsule (8 mg total) by mouth daily with breakfast.  . tamsulosin (FLOMAX) 0.4 MG CAPS capsule Take 0.4 mg by mouth at bedtime.  . traZODone (DESYREL) 100 MG tablet Take 1 tablet (100 mg total) by mouth at bedtime as needed for sleep.  Marland Kitchen docusate sodium (COLACE) 100 MG capsule Take 2 capsules daily to help soften the stool.  It this does not work, you can increase to 2 capsules in the morning and 2 capsules in the evening.  If you do not have a bowel movement please call the [facility]. (Patient not taking: Reported on 07/18/2019)  . HYDROcodone-acetaminophen (NORCO) 10-325 MG tablet Take 1 tablet by mouth every 6 (six) hours as needed.  . lidocaine-prilocaine (EMLA) cream Apply a quarter size amount to port site 1 hour prior to chemo. Do not rub in. Cover with plastic wrap. (Patient not taking: Reported on 06/07/2019)  . NARCAN 4 MG/0.1ML LIQD nasal spray kit Place 1 spray into the nose as needed.  . ondansetron (ZOFRAN) 8 MG tablet TAKE 1 TABLET BY MOUTH EVERY 8 HOURS AS NEEDED FOR NAUSEA FOR VOMITING (Patient not taking: Reported on 08/23/2019)  . prochlorperazine (COMPAZINE) 10 MG tablet Take 1 tablet (10 mg total) by mouth every 6 (six) hours as needed for nausea or vomiting. (Patient not taking: Reported on 06/27/2019)  . Wheat Dextrin (BENEFIBER PO) Take 1 Package by mouth as needed.   . [DISCONTINUED] HYDROcodone-acetaminophen (NORCO) 10-325 MG tablet Take 1 tablet by mouth every 6 (six) hours as needed. (Patient not taking: Reported on 09/21/2019)   Facility-Administered Encounter Medications as of 09/21/2019  Medication  . sodium chloride flush (NS) 0.9 % injection 10 mL    ALLERGIES:  Allergies  Allergen Reactions  . Meloxicam Other (See Comments)    Caused stomach bleeding.     PHYSICAL EXAM:  ECOG Performance status: 1  Vitals:   09/21/19 1149  BP: 100/67  Pulse: (!) 112  Resp: 16  Temp: (!) 97.1 F (36.2 C)  SpO2: 96%   Filed Weights   09/21/19 1149  Weight: 225 lb 12.8 oz (102.4 kg)    Physical Exam Vitals reviewed.  Constitutional:      Appearance: Normal appearance.  Cardiovascular:     Rate and Rhythm: Normal rate and regular rhythm.     Heart sounds: Normal heart sounds.  Pulmonary:     Effort: Pulmonary effort is normal.     Breath sounds: Normal breath sounds.  Abdominal:     General: There is no distension.     Palpations: Abdomen is soft. There is no mass.  Musculoskeletal:        General: No swelling.  Skin:    General: Skin is warm.  Neurological:     General: No focal deficit present.     Mental Status: He is alert and oriented to person, place, and time.  Psychiatric:        Mood and Affect: Mood normal.        Behavior: Behavior normal.      LABORATORY DATA:  I have reviewed the labs as listed.  CBC    Component Value Date/Time   WBC 4.5 09/21/2019 1111   RBC 3.80 (L) 09/21/2019 1111   HGB 11.9 (L) 09/21/2019 1111   HCT 35.6 (L) 09/21/2019 1111   PLT 185 09/21/2019 1111   MCV 93.7 09/21/2019 1111   MCH 31.3 09/21/2019 1111   MCHC 33.4 09/21/2019 1111   RDW 18.4 (H) 09/21/2019 1111   LYMPHSABS 0.7 09/21/2019 1111   MONOABS 0.3 09/21/2019 1111   EOSABS 0.1 09/21/2019 1111   BASOSABS 0.0 09/21/2019 1111   CMP Latest Ref Rng & Units 09/21/2019 08/23/2019 08/11/2019  Glucose 70 - 99 mg/dL 120(H) 109(H) 98  BUN 8 - 23 mg/dL _0 Creatinine 0.61 - 1.24 mg/dL 1.21 1.21 1.28(H)  Sodium 135 - 145 mmol/L 140 139 139  Potassium 3.5 - 5.1 mmol/L 3.5 3.3(L) 3.9  Chloride 98 - 111 mmol/L 104 107 102  CO2 22 - 32 mmol/L _1 Calcium 8.9 - 10.3 mg/dL 9.6 9.0 9.6  Total Protein 6.5 - 8.1 g/dL 6.5 6.5 6.7  Total Bilirubin 0.3 - 1.2 mg/dL 0.5 0.4 0.6  Alkaline Phos 38 - 126 U/L 92 82 91  AST 15 - 41 U/L 16 13(L) 16  ALT 0 - 44 U/L _2 DIAGNOSTIC IMAGING:  I have independently reviewed the scans and discussed with the patient.   I have reviewed Venita Lick LPN's note and agree with the documentation.  I personally performed a face-to-face visit, made revisions and my assessment and plan is as follows.    ASSESSMENT & PLAN:   Prostate carcinoma ([place]) 1.  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 -Abiraterone/prednisone, cabazitaxel with progression. -Guardant 360 showed ATM mutation. -Olaparib 300 mg twice daily started on 07/28/2019. -He is tolerating  olaparib reasonably well.  We reviewed labs including CBC and LFTs which are within normal limits.  Creatinine is also normal at 1.21. -I plan to see him back in 2 weeks for follow-up.  I will repeat his PSA at that time.  He will also receive [name] at that time.  2.  Upper and lower back pain: -He is using fentanyl 75 mcg patch.  He is taking hydrocodone 10 mg 3 to 4 tablets daily. -Pain is not well controlled.  I have suggested him to take hydrocodone every 4 hours.  3.  Bone metastasis: -Denosumab on hold since 12/21/2018 secondary to ONJ.  It is held indefinitely.       Orders placed this encounter:  Orders Placed This Encounter  Procedures  . CBC with Differential/Platelet  . Comprehensive metabolic panel  . PSA      Derek Jack, MD [place] [phone]

## 2019-09-21 NOTE — Assessment & Plan Note (Signed)
1.  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 -Abiraterone/prednisone, cabazitaxel with progression. -Guardant 360 showed ATM mutation. -Olaparib 300 mg twice daily started on 07/28/2019. -He is tolerating olaparib reasonably well.  We reviewed labs including CBC and LFTs which are within normal limits.  Creatinine is also normal at 1.21. -I plan to see him back in 2 weeks for follow-up.  I will repeat his PSA at that time.  He will also receive [name] at that time.  2.  Upper and lower back pain: -He is using fentanyl 75 mcg patch.  He is taking hydrocodone 10 mg 3 to 4 tablets daily. -Pain is not well controlled.  I have suggested him to take hydrocodone every 4 hours.  3.  Bone metastasis: -Denosumab on hold since 12/21/2018 secondary to ONJ.  It is held indefinitely.

## 2019-09-21 NOTE — Patient Instructions (Signed)
[facility] at [facility] Discharge Instructions  You were seen today by Dr. Katragadda. He went over your recent lab results. He will see you back in 2 weeks for labs and follow up.   Thank you for choosing [facility] at [facility] to provide your oncology and hematology care.  To afford each patient quality time with our provider, please arrive at least 15 minutes before your scheduled appointment time.   If you have a lab appointment with the Cancer Center please come in thru the  Main Entrance and check in at the main information desk  You need to re-schedule your appointment should you arrive 10 or more minutes late.  We strive to give you quality time with our providers, and arriving late affects you and other patients whose appointments are after yours.  Also, if you no show three or more times for appointments you may be dismissed from the clinic at the providers discretion.     Again, thank you for choosing [facility].  Our hope is that these requests will decrease the amount of time that you wait before being seen by our physicians.       _____________________________________________________________  Should you have questions after your visit to [facility], please contact our office at [phone] between the hours of 8:00 a.m. and 4:30 p.m.  Voicemails left after 4:00 p.m. will not be returned until the following business day.  For prescription refill requests, have your pharmacy contact our office and allow 72 hours.    Cancer Center Support Programs:   > Cancer Support Group  2nd Tuesday of the month 1pm-2pm, Journey Room    

## 2019-09-26 MED FILL — LYNPARZA 150 MG TABLET: 150 | 30 days supply | Qty: 120 | Fill #1

## 2019-09-26 NOTE — Telephone Encounter (Signed)
Oral Oncology Patient Advocate Encounter  Received notification from [employer] Pres[employer] Savings program that patient has been successfully enrolled into their program to receive Lynparza from the manufacturer at $0 out of pocket until 08/03/2020.    He knows we will have to re-apply.   Patient currently has a grant through [employer] that he can use until exhausted.   Patient knows to call the office with questions or concerns.   Oral Oncology Clinic will continue to follow.  [place] Patient [place] Phone [phone] Fax [phone] 09/26/2019 11:30 AM

## 2019-10-05 ENCOUNTER — Other Ambulatory Visit: Payer: Self-pay

## 2019-10-05 ENCOUNTER — Encounter (HOSPITAL_COMMUNITY): Payer: Self-pay | Admitting: Hematology

## 2019-10-05 ENCOUNTER — Inpatient Hospital Stay (HOSPITAL_COMMUNITY): Payer: Medicare Other

## 2019-10-05 ENCOUNTER — Inpatient Hospital Stay (HOSPITAL_COMMUNITY): Payer: Medicare Other | Attending: Hematology | Admitting: Hematology

## 2019-10-05 VITALS — BP 100/66 | HR 122 | Temp 97.3°F | Resp 18 | Wt 223.5 lb

## 2019-10-05 DIAGNOSIS — C7951 Secondary malignant neoplasm of bone: Secondary | ICD-10-CM | POA: Insufficient documentation

## 2019-10-05 DIAGNOSIS — Z5111 Encounter for antineoplastic chemotherapy: Secondary | ICD-10-CM | POA: Insufficient documentation

## 2019-10-05 DIAGNOSIS — C61 Malignant neoplasm of prostate: Secondary | ICD-10-CM | POA: Diagnosis not present

## 2019-10-05 DIAGNOSIS — I10 Essential (primary) hypertension: Secondary | ICD-10-CM | POA: Diagnosis not present

## 2019-10-05 DIAGNOSIS — G893 Neoplasm related pain (acute) (chronic): Secondary | ICD-10-CM

## 2019-10-05 LAB — COMPREHENSIVE METABOLIC PANEL
ALT: 13 U/L (ref 0–44)
AST: 16 U/L (ref 15–41)
Albumin: 3.6 g/dL (ref 3.5–5.0)
Alkaline Phosphatase: 95 U/L (ref 38–126)
Anion gap: 11 (ref 5–15)
BUN: 13 mg/dL (ref 8–23)
CO2: 25 mmol/L (ref 22–32)
Calcium: 10.4 mg/dL — ABNORMAL HIGH (ref 8.9–10.3)
Chloride: 101 mmol/L (ref 98–111)
Creatinine, Ser: 1.34 mg/dL — ABNORMAL HIGH (ref 0.61–1.24)
GFR calc Af Amer: >60 mL/min (ref >60)
GFR calc non Af Amer: 56 mL/min — ABNORMAL LOW (ref >60)
Glucose, Bld: 137 mg/dL — ABNORMAL HIGH (ref 70–99)
Potassium: 3.8 mmol/L (ref 3.5–5.1)
Sodium: 137 mmol/L (ref 135–145)
Total Bilirubin: 0.4 mg/dL (ref 0.3–1.2)
Total Protein: 6.8 g/dL (ref 6.5–8.1)

## 2019-10-05 LAB — CBC WITH DIFFERENTIAL/PLATELET
Abs Immature Granulocytes: 0.01 10*3/uL (ref 0.00–0.07)
Basophils Absolute: 0 10*3/uL (ref 0.0–0.1)
Basophils Relative: 0 %
Eosinophils Absolute: 0.1 10*3/uL (ref 0.0–0.5)
Eosinophils Relative: 2 %
HCT: 36.5 % — ABNORMAL LOW (ref 39.0–52.0)
Hemoglobin: 12.1 g/dL — ABNORMAL LOW (ref 13.0–17.0)
Immature Granulocytes: 0 %
Lymphocytes Relative: 18 %
Lymphs Abs: 0.8 10*3/uL (ref 0.7–4.0)
MCH: 31.3 pg (ref 26.0–34.0)
MCHC: 33.2 g/dL (ref 30.0–36.0)
MCV: 94.6 fL (ref 80.0–100.0)
Monocytes Absolute: 0.3 10*3/uL (ref 0.1–1.0)
Monocytes Relative: 6 %
Neutro Abs: 3.3 10*3/uL (ref 1.7–7.7)
Neutrophils Relative %: 74 %
Platelets: 213 10*3/uL (ref 150–400)
RBC: 3.86 MIL/uL — ABNORMAL LOW (ref 4.22–5.81)
RDW: 19.7 % — ABNORMAL HIGH (ref 11.5–15.5)
WBC: 4.6 10*3/uL (ref 4.0–10.5)
nRBC: 0 % (ref 0.0–0.2)

## 2019-10-05 LAB — PSA: Prostatic Specific Antigen: 123.89 ng/mL — ABNORMAL HIGH (ref 0.00–4.00)

## 2019-10-05 LAB — VITAMIN D 25 HYDROXY (VIT D DEFICIENCY, FRACTURES): Vit D, 25-Hydroxy: 44.25 ng/mL (ref 30–100)

## 2019-10-05 MED ORDER — FENTANYL 75 MCG/HR TD PT72: 1.0000 | MEDICATED_PATCH | TRANSDERMAL | 0 refills | Status: DC

## 2019-10-05 MED ORDER — LEUPROLIDE ACETATE (3 MONTH) 22.5 MG ~~LOC~~ KIT
22.5000 mg | PACK | Freq: Once | SUBCUTANEOUS | Status: AC
  Administered 2019-10-05: 22.5 mg via SUBCUTANEOUS
  Filled 2019-10-05: qty 22.5

## 2019-10-05 NOTE — Progress Notes (Signed)
[address], [name] [address]   CLINIC:  Medical Oncology/Hematology  PCP:  [name], [place] [address] [phone]   REASON FOR VISIT:  Follow-up for prostate cancer   BRIEF ONCOLOGIC HISTORY:  Oncology History  Prostate carcinoma ([place])  12/10/2015 Imaging   CTA chest, multiple thoracic and rib osseous lesions. no primary evident. Atherosclerosis including aortic and CAD   12/12/2015 Imaging   CT abdomen/pelvis with sclerotic osseous lesions througout the lumbar spine, sacrum, bilateral iliac bones and L proximal femur worrisome for sclerotic osseous mets,mild prostatomegaly, no LAD, Infrarenal 3.8 cm AAA   12/12/2015 Tumor Marker   PSA 153.85   12/18/2015 Imaging   Bone Scan Diffuse bony metastatic disease, posterior calvarium, sternum, thoracic, lumbar spine, bilateral ribs, L shoulder, bilateral bony pelvis, proximal L femur   12/24/2015 Initial Biopsy   CT biopsy of L iliac bone lesion   12/26/2015 Pathology Results   Bone, biopsy, left iliac - POSITIVE FOR METASTATIC ADENOCARCINOMA.   12/28/2015 - 09/01/2016 Chemotherapy   Firmagon instituted 240 mg    01/01/2016 Procedure   Port placed by IR.   01/07/2016 - 04/11/2016 Chemotherapy   Docetaxel every 21 days x 6 cycles with dose reductions due to tolerance   02/18/2016 Adverse Reaction   GI toxicity from docetaxel, dose reduced to 60 mg/m2   02/18/2016 Treatment Plan Change   Docetaxel dose reduced by 20%   03/06/2016 Procedure   Dr. Enrique Sack- Multiple extraction of tooth numbers 3, 5, 6, 8, 9, 10, 11, 12, 20, 21, 22, 27, 28, and 31. 4 Quadrants of alveoloplasty. Bilateral mandibular lingual tori reductions.   04/09/2016 Imaging   Brain MRI 1. No intracranial metastatic disease 2. No acute intracranial abnormality 3. Findings of chronic microvascular disease   05/14/2016 Imaging   CT C/A/P Stable appearing diffuse bony metastatic disease. No new  significant disease is seen Innumerable patchy sclerotic osseous metastases throughout the axial and proximal appendicular skeleton, stable in size and distribution, significantly increased in density in the interval, likely reflecting treatment effect. 2. No new or progressive metastatic disease in the chest, abdomen or pelvis. 3. Bilateral perifissural pulmonary nodules are stable and probably benign. 4. Aortic atherosclerosis. Infrarenal 4.3 cm abdominal aortic aneurysm, minimally increased in size. Recommend followup by ultrasound in 1 year.   06/02/2016 Tumor Marker   PSA 0.99 ng/ml    - 07/31/2016 Radiation Therapy   Palliative XRT by Dr. Lianne Cure in [place].    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  The Multi-Gene Panel offered by [facility] includes sequencing and/or deletion duplication testing of the following 85 genes: AIP, ALK, APC, ATM, AXIN2,BAP1,  BARD1, BLM, BMPR1A, BRCA1, BRCA2, BRIP1, CASR, CDC73, CDH1, CDK4, CDKN1B, CDKN1C, CDKN2A (p14ARF), CDKN2A (p16INK4a), CEBPA, CHEK2, CTNNA1, DICER1, DIS3L2, EGFR (c.2369C>T, p.Thr790Met variant only), EPCAM (Deletion/duplication testing only), FH, FLCN, GATA2, GPC3, GREM1 (Promoter region deletion/duplication testing only), HOXB13 (c.251G>A, p.Gly84Glu), HRAS, KIT, MAX, MEN1, MET, MITF (c.952G>A, p.Glu318Lys variant only), MLH1, MSH2, MSH3, MSH6, MUTYH, NBN, NF1, NF2, NTHL1, PALB2, PDGFRA, PHOX2B, PMS2, POLD1, POLE, POT1, PRKAR1A, PTCH1, PTEN, RAD50, RAD51C, RAD51D, RB1, RECQL4, RET, RNF43, RUNX1, SDHAF2, SDHA (sequence changes only), SDHB, SDHC, SDHD, SMAD4, SMARCA4, SMARCB1, SMARCE1, STK11, SUFU, TERC, TERT, TMEM127, TP53, TSC1, TSC2, VHL, WRN and WT1.  The report date is August 24, 2018  This  is not thought to increase the risk for colon cancer or other cancers, unless there is the presence of two  pathogenic mutations.   09/27/2018 - 06/27/2019 Chemotherapy   The patient had palonosetron (ALOXI) injection 0.25 mg, 0.25 mg, Intravenous,  Once, 10 of 10 cycles Administration: 0.25 mg (09/27/2018), 0.25 mg (10/25/2018), 0.25 mg (11/23/2018), 0.25 mg (12/21/2018), 0.25 mg (01/18/2019), 0.25 mg (02/15/2019), 0.25 mg (03/15/2019), 0.25 mg (04/12/2019), 0.25 mg (05/10/2019), 0.25 mg (06/07/2019) pegfilgrastim (NEULASTA ONPRO KIT) injection 6 mg, 6 mg, Subcutaneous, Once, 9 of 9 cycles Administration: 6 mg (10/25/2018), 6 mg (11/23/2018), 6 mg (12/21/2018), 6 mg (01/18/2019), 6 mg (02/15/2019), 6 mg (03/15/2019), 6 mg (04/12/2019), 6 mg (05/10/2019), 6 mg (06/07/2019) pegfilgrastim-cbqv (UDENYCA) injection 6 mg, 6 mg, Subcutaneous, Once, 1 of 1 cycle Administration: 6 mg (09/29/2018) cabazitaxel (JEVTANA) 35 mg in dextrose 5 % 250 mL chemo infusion, 15 mg/m2 = 35 mg (100 % of original dose 15 mg/m2), Intravenous,  Once, 10 of 10 cycles Dose modification: 15 mg/m2 (original dose 15 mg/m2, Cycle 1, Reason: Provider Judgment), 20 mg/m2 (original dose 15 mg/m2, Cycle 2, Reason: Provider Judgment), 20 mg/m2 (original dose 20 mg/m2, Cycle 5, Reason: Patient Age, Comment: Change in jevtana orderable), 15 mg/m2 (75 % of original dose 20 mg/m2, Cycle 8, Reason: Other (see comments), Comment: nausea, worsening numbness) Administration: 35 mg (09/27/2018), 46 mg (10/25/2018), 46 mg (11/23/2018), 46 mg (12/21/2018), 46 mg (01/18/2019), 46 mg (02/15/2019), 46 mg (03/15/2019), 35 mg (04/12/2019), 35 mg (05/10/2019), 35 mg (06/07/2019)  for chemotherapy treatment.    Bone metastases ([place])  12/26/2015 Initial Diagnosis   Bone metastases ([place])   09/27/2018 - 06/27/2019 Chemotherapy   The patient had palonosetron (ALOXI) injection 0.25 mg, 0.25 mg, Intravenous,  Once, 4 of 6 cycles Administration: 0.25 mg (09/27/2018), 0.25 mg (10/25/2018), 0.25 mg (11/23/2018), 0.25 mg (12/21/2018) pegfilgrastim (NEULASTA ONPRO KIT) injection 6 mg, 6 mg, Subcutaneous,  Once, 3 of 5 cycles Administration: 6 mg (10/25/2018), 6 mg (11/23/2018), 6 mg (12/21/2018) pegfilgrastim-cbqv (UDENYCA) injection 6 mg, 6 mg, Subcutaneous, Once, 1 of 1 cycle Administration: 6 mg (09/29/2018) cabazitaxel (JEVTANA) 35 mg in dextrose 5 % 250 mL chemo infusion, 15 mg/m2 = 35 mg (100 % of original dose 15 mg/m2), Intravenous,  Once, 4 of 6 cycles Dose modification: 15 mg/m2 (original dose 15 mg/m2, Cycle 1, Reason: Provider Judgment), 20 mg/m2 (original dose 15 mg/m2, Cycle 2, Reason: Provider Judgment) Administration: 35 mg (09/27/2018), 46 mg (10/25/2018), 46 mg (11/23/2018), 46 mg (12/21/2018)  for chemotherapy treatment.       CANCER STAGING: Cancer Staging Prostate carcinoma [facility]) Staging form: Prostate, AJCC 7th Edition - Clinical stage from 12/24/2015: Stage IV (TX, N0, M1b, PSA: 20 or greater) - Signed by Baird Cancer, PA-C on 01/07/2016    INTERVAL HISTORY:  Mr. Zerbe 64 y.o. male seen for follow-up of metastatic prostate cancer to the bones.  He is taking olaparib 300 mg twice daily.  He reports occasional nausea but denied any vomiting.  Constipation is also stable.  He has bowel movement every other day.  He requests refill for fentanyl patch.  Numbness and tingling in the fingertips and toes has been stable.  Reports decrease in appetite and energy levels.  REVIEW OF SYSTEMS:  Review of Systems  Respiratory: Positive for shortness of breath.   Gastrointestinal: Positive for constipation and nausea.  Neurological: Positive for numbness.  Psychiatric/Behavioral: Positive for depression.  All other systems reviewed and are negative.    PAST MEDICAL/SURGICAL HISTORY:  Past Medical History:  Diagnosis Date  . Arthritis    Left knee  . Bone cancer ([place])   . Bone metastases ([place]) 12/26/2015  . Cancer [facility])    Prostate  . DVT (deep venous thrombosis) ([place]) 2004  . Family history of prostate cancer   . Family history of stomach cancer   . Kidney stones    ~2002   . Prostate carcinoma ([place]) 12/26/2015   Past Surgical History:  Procedure Laterality Date  . CIRCUMCISION  30 years ago  . MULTIPLE EXTRACTIONS WITH ALVEOLOPLASTY N/A 03/06/2016   Procedure: Extraction of tooth #'s 3,5,6,8-12,20-22, 27, 28, and 31 with alveoloplasty and bilateral mandibular tori reductions;  Surgeon: Lenn Cal, DDS;  Location: [facility];  Service: Oral Surgery;  Laterality: N/A;     SOCIAL HISTORY:  Social History   Socioeconomic History  . Marital status: Single    Spouse name: Not on file  . Number of children: 2  . Years of education: Not on file  . Highest education level: Not on file  Occupational History  . Occupation: Construction/carpenter  Tobacco Use  . Smoking status: Current Every Day Smoker    Packs/day: 1.00    Years: 50.00    Pack years: 50.00    Types: Cigarettes    Start date: 06/16/1965  . Smokeless tobacco: Former Systems developer    Types: [place] date: 08/04/2005  Substance and Sexual Activity  . Alcohol use: No    Alcohol/week: 0.0 standard drinks  . Drug use: Yes    Types: Marijuana    Comment: THC for appetite; occ  . Sexual activity: Not on file  Other Topics Concern  . Not on file  Social History Narrative  . Not on file   Social Determinants of Health   Financial Resource Strain:   . Difficulty of Paying Living Expenses: Not on file  Food Insecurity:   . Worried About Charity fundraiser in the Last Year: Not on file  . Ran Out of Food in the Last Year: Not on file  Transportation Needs:   . Lack of Transportation (Medical): Not on file  . Lack of Transportation (Non-Medical): Not on file  Physical Activity:   . Days of Exercise per Week: Not on file  . Minutes of Exercise per Session: Not on file  Stress:   . Feeling of Stress : Not on file  Social Connections:   . Frequency of Communication with Friends and Family: Not on file  . Frequency of Social Gatherings with Friends and Family: Not on file  . Attends Religious  Services: Not on file  . Active Member of Clubs or Organizations: Not on file  . Attends Archivist Meetings: Not on file  . Marital Status: Not on file  Intimate Partner Violence:   . Fear of Current or Ex-Partner: Not on file  . Emotionally Abused: Not on file  . Physically Abused: Not on file  . Sexually Abused: Not on file    FAMILY HISTORY:  Family History  Problem Relation Age of Onset  . Stomach cancer Mother        dx 18s  . Aneurysm Maternal Aunt        brain  . Alcohol abuse Maternal Uncle   . Stomach cancer Maternal Grandmother        dx 50s-60s  . Prostate cancer Maternal Uncle        dx 75s    CURRENT MEDICATIONS:  Outpatient Encounter Medications as of 10/05/2019  Medication Sig  . Calcium Carb-Cholecalciferol (CALCIUM 500 + D3) 500-600 MG-UNIT TABS Take 2 tablets by mouth daily.  Marland Kitchen docusate sodium (COLACE) 100 MG capsule Take 2 capsules daily to help soften the stool.  It this does not work, you can increase to 2 capsules in the morning and 2 capsules in the evening.  If you do not have a bowel movement please call the [facility]. (Patient not taking: Reported on 07/18/2019)  . ergocalciferol (VITAMIN D2) 50000 units capsule Take 1 capsule (50,000 Units total) by mouth every Friday.  . fentaNYL (DURAGESIC) 75 MCG/HR Place 1 patch onto the skin every 3 (three) days.  Marland Kitchen HYDROcodone-acetaminophen (NORCO) 10-325 MG tablet Take 1 tablet by mouth every 6 (six) hours as needed.  . lidocaine-prilocaine (EMLA) cream Apply a quarter size amount to port site 1 hour prior to chemo. Do not rub in. Cover with plastic wrap. (Patient not taking: Reported on 06/07/2019)  . NARCAN 4 MG/0.1ML LIQD nasal spray kit Place 1 spray into the nose as needed.  Marland Kitchen olaparib (LYNPARZA) 150 MG tablet Take 2 tablets (300 mg total) by mouth 2 (two) times daily. Swallow whole. May take with food to decrease nausea and vomiting.  Marland Kitchen omeprazole (PRILOSEC) 20 MG capsule Take 1  capsule (20 mg total) by mouth daily.  . ondansetron (ZOFRAN) 8 MG tablet TAKE 1 TABLET BY MOUTH EVERY 8 HOURS AS NEEDED FOR NAUSEA FOR VOMITING (Patient not taking: Reported on 08/23/2019)  . prochlorperazine (COMPAZINE) 10 MG tablet Take 1 tablet (10 mg total) by mouth every 6 (six) hours as needed for nausea or vomiting. (Patient not taking: Reported on 06/27/2019)  . Pseudoeph-Chlorphen-DM (CHILDRENS NYQUIL COLD/COUGH PO) Take 15 mLs by mouth at bedtime.  . silodosin (RAPAFLO) 8 MG CAPS capsule Take 1 capsule (8 mg total) by mouth daily with breakfast.  . tamsulosin (FLOMAX) 0.4 MG CAPS capsule Take 0.4 mg by mouth at bedtime.  . traZODone (DESYREL) 100 MG tablet Take 1 tablet (100 mg total) by mouth at bedtime as needed for sleep.  . Wheat Dextrin (BENEFIBER PO) Take 1 Package by mouth as needed.   . [DISCONTINUED] fentaNYL (DURAGESIC) 75 MCG/HR Place 1 patch onto the skin every 3 (three) days.   Facility-Administered Encounter Medications as of 10/05/2019  Medication  . sodium chloride flush (NS) 0.9 % injection 10 mL    ALLERGIES:  Allergies  Allergen Reactions  . Meloxicam Other (See Comments)    Caused stomach bleeding.     PHYSICAL EXAM:  ECOG Performance status: 1  Vitals:   10/05/19 1007  BP: 100/66  Pulse: (!) 122  Resp: 18  Temp: (!) 97.3 F (36.3 C)  SpO2: 98%   Filed Weights   10/05/19 1007  Weight: 223 lb 8 oz (101.4 kg)    Physical Exam Vitals reviewed.  Constitutional:      Appearance: Normal appearance.  Cardiovascular:     Rate and Rhythm: Normal rate and regular rhythm.     Heart sounds: Normal heart sounds.  Pulmonary:     Effort: Pulmonary effort is normal.     Breath sounds: Normal breath sounds.  Abdominal:     General: There is no distension.     Palpations: Abdomen is soft. There is no mass.  Musculoskeletal:        General: No swelling.  Skin:    General: Skin is warm.  Neurological:     General: No focal  deficit present.     Mental  Status: He is alert and oriented to person, place, and time.  Psychiatric:        Mood and Affect: Mood normal.        Behavior: Behavior normal.      LABORATORY DATA:  I have reviewed the labs as listed.  CBC    Component Value Date/Time   WBC 4.6 10/05/2019 0943   RBC 3.86 (L) 10/05/2019 0943   HGB 12.1 (L) 10/05/2019 0943   HCT 36.5 (L) 10/05/2019 0943   PLT 213 10/05/2019 0943   MCV 94.6 10/05/2019 0943   MCH 31.3 10/05/2019 0943   MCHC 33.2 10/05/2019 0943   RDW 19.7 (H) 10/05/2019 0943   LYMPHSABS 0.8 10/05/2019 0943   MONOABS 0.3 10/05/2019 0943   EOSABS 0.1 10/05/2019 0943   BASOSABS 0.0 10/05/2019 0943   CMP Latest Ref Rng & Units 10/05/2019 09/21/2019 08/23/2019  Glucose 70 - 99 mg/dL 137(H) 120(H) 109(H)  BUN 8 - 23 mg/dL _0 Creatinine 0.61 - 1.24 mg/dL 1.34(H) 1.21 1.21  Sodium 135 - 145 mmol/L 137 140 139  Potassium 3.5 - 5.1 mmol/L 3.8 3.5 3.3(L)  Chloride 98 - 111 mmol/L 101 104 107  CO2 22 - 32 mmol/L _1 Calcium 8.9 - 10.3 mg/dL 10.4(H) 9.6 9.0  Total Protein 6.5 - 8.1 g/dL 6.8 6.5 6.5  Total Bilirubin 0.3 - 1.2 mg/dL 0.4 0.5 0.4  Alkaline Phos 38 - 126 U/L 95 92 82  AST 15 - 41 U/L 16 16 13(L)  ALT 0 - 44 U/L _2 DIAGNOSTIC IMAGING:  I have independently reviewed the scans.    ASSESSMENT & PLAN:   Prostate carcinoma ([place]) 1.  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 -Abiraterone/prednisone, cabazitaxel with progression.  Guardant 360 showed ATM mutation. -Olaparib 300 mg twice daily started on 07/28/2019. -He is having occasional nausea but denied any vomiting.  Has some constipation. -I have reviewed his CBC which are grossly within normal limits.  LFTs are normal.  Creatinine has gone up to 1.34.  Calcium also increased to 10.4. -He will proceed with [name] injection today.  We are checking a PSA level today.  He will be seen back  in 2 weeks for follow-up.  2.  Mild hypercalcemia: -Calcium today is 10.4.  We will check a vitamin D level.  He is taking calcium twice daily. -I have told him to cut back on calcium to once a day.  He is taking vitamin D 50,000 units weekly.  We will follow up on the level.  3.  Upper and lower back pain: -He is using fentanyl 75 mcg patch.  He is taking hydrocodone 10 mg 3 to 4 tablets/day.  Pain is well controlled. -We will send in refill for fentanyl.  4.  Bone metastasis: -Denosumab on hold since 12/21/2018 secondary to ONJ.  It is held indefinitely.     Orders placed this encounter:  Orders Placed This Encounter  Procedures  . Vitamin D 25 hydroxy      Derek Jack, MD [place] [phone]

## 2019-10-05 NOTE — Patient Instructions (Addendum)
[place] at [facility] Discharge Instructions  You were seen today by Dr. Delton Coombes. He went over your recent lab results. Stop taking the vitamin D and only take the Calcium once a day. He will see you back in 2 weeks for labs and follow up.   Thank you for choosing [place] at [facility] to provide your oncology and hematology care.  To afford each patient quality time with our provider, please arrive at least 15 minutes before your scheduled appointment time.   If you have a lab appointment with the [facility] please come in thru the  Main Entrance and check in at the main information desk  You need to re-schedule your appointment should you arrive 10 or more minutes late.  We strive to give you quality time with our providers, and arriving late affects you and other patients whose appointments are after yours.  Also, if you no show three or more times for appointments you may be dismissed from the clinic at the providers discretion.     Again, thank you for choosing [facility].  Our hope is that these requests will decrease the amount of time that you wait before being seen by our physicians.       _____________________________________________________________  Should you have questions after your visit to [facility] [place], please contact our office at [phone] between the hours of 8:00 a.m. and 4:30 p.m.  Voicemails left after 4:00 p.m. will not be returned until the following business day.  For prescription refill requests, have your pharmacy contact our office and allow 72 hours.    Cancer Center Support Programs:   > Cancer Support Group  2nd Tuesday of the month 1pm-2pm, Journey Room

## 2019-10-06 ENCOUNTER — Other Ambulatory Visit (HOSPITAL_COMMUNITY): Payer: Self-pay | Admitting: *Deleted

## 2019-10-06 DIAGNOSIS — C61 Malignant neoplasm of prostate: Secondary | ICD-10-CM

## 2019-10-08 NOTE — Assessment & Plan Note (Signed)
1.  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 -Abiraterone/prednisone, cabazitaxel with progression.  Guardant 360 showed ATM mutation. -Olaparib 300 mg twice daily started on 07/28/2019. -He is having occasional nausea but denied any vomiting.  Has some constipation. -I have reviewed his CBC which are grossly within normal limits.  LFTs are normal.  Creatinine has gone up to 1.34.  Calcium also increased to 10.4. -He will proceed with [name] injection today.  We are checking a PSA level today.  He will be seen back in 2 weeks for follow-up.  2.  Mild hypercalcemia: -Calcium today is 10.4.  We will check a vitamin D level.  He is taking calcium twice daily. -I have told him to cut back on calcium to once a day.  He is taking vitamin D 50,000 units weekly.  We will follow up on the level.  3.  Upper and lower back pain: -He is using fentanyl 75 mcg patch.  He is taking hydrocodone 10 mg 3 to 4 tablets/day.  Pain is well controlled. -We will send in refill for fentanyl.  4.  Bone metastasis: -Denosumab on hold since 12/21/2018 secondary to ONJ.  It is held indefinitely.

## 2019-10-19 ENCOUNTER — Inpatient Hospital Stay (HOSPITAL_COMMUNITY): Payer: Medicare Other | Admitting: Hematology

## 2019-10-19 ENCOUNTER — Ambulatory Visit (HOSPITAL_COMMUNITY): Payer: Medicare Other

## 2019-10-19 ENCOUNTER — Inpatient Hospital Stay (HOSPITAL_COMMUNITY): Payer: Medicare Other

## 2019-10-26 MED FILL — LYNPARZA 150 MG TABLET: 150 | 30 days supply | Qty: 120 | Fill #0

## 2019-11-03 ENCOUNTER — Encounter (HOSPITAL_COMMUNITY): Payer: Self-pay | Admitting: Hematology

## 2019-11-03 ENCOUNTER — Inpatient Hospital Stay (HOSPITAL_COMMUNITY): Payer: Medicare Other

## 2019-11-03 ENCOUNTER — Ambulatory Visit (HOSPITAL_COMMUNITY): Payer: Medicare Other

## 2019-11-03 ENCOUNTER — Other Ambulatory Visit (HOSPITAL_COMMUNITY): Payer: Medicare Other

## 2019-11-03 ENCOUNTER — Other Ambulatory Visit: Payer: Self-pay

## 2019-11-03 ENCOUNTER — Inpatient Hospital Stay (HOSPITAL_COMMUNITY): Payer: Medicare Other | Attending: Hematology | Admitting: Hematology

## 2019-11-03 VITALS — BP 97/67 | HR 107 | Temp 97.1°F | Resp 16 | Wt 222.6 lb

## 2019-11-03 DIAGNOSIS — Z5111 Encounter for antineoplastic chemotherapy: Secondary | ICD-10-CM | POA: Diagnosis not present

## 2019-11-03 DIAGNOSIS — C7951 Secondary malignant neoplasm of bone: Secondary | ICD-10-CM

## 2019-11-03 DIAGNOSIS — F17210 Nicotine dependence, cigarettes, uncomplicated: Secondary | ICD-10-CM | POA: Insufficient documentation

## 2019-11-03 DIAGNOSIS — M549 Dorsalgia, unspecified: Secondary | ICD-10-CM | POA: Diagnosis not present

## 2019-11-03 DIAGNOSIS — C61 Malignant neoplasm of prostate: Secondary | ICD-10-CM | POA: Diagnosis not present

## 2019-11-03 DIAGNOSIS — Z8042 Family history of malignant neoplasm of prostate: Secondary | ICD-10-CM | POA: Insufficient documentation

## 2019-11-03 DIAGNOSIS — G893 Neoplasm related pain (acute) (chronic): Secondary | ICD-10-CM | POA: Diagnosis not present

## 2019-11-03 DIAGNOSIS — Z800 Family history of malignant neoplasm of digestive organs: Secondary | ICD-10-CM | POA: Diagnosis not present

## 2019-11-03 DIAGNOSIS — M25552 Pain in left hip: Secondary | ICD-10-CM | POA: Diagnosis not present

## 2019-11-03 DIAGNOSIS — Z79899 Other long term (current) drug therapy: Secondary | ICD-10-CM | POA: Diagnosis not present

## 2019-11-03 LAB — CBC WITH DIFFERENTIAL/PLATELET
Abs Immature Granulocytes: 0.02 10*3/uL (ref 0.00–0.07)
Basophils Absolute: 0 10*3/uL (ref 0.0–0.1)
Basophils Relative: 1 %
Eosinophils Absolute: 0.2 10*3/uL (ref 0.0–0.5)
Eosinophils Relative: 3 %
HCT: 35.1 % — ABNORMAL LOW (ref 39.0–52.0)
Hemoglobin: 11.9 g/dL — ABNORMAL LOW (ref 13.0–17.0)
Immature Granulocytes: 0 %
Lymphocytes Relative: 17 %
Lymphs Abs: 1 10*3/uL (ref 0.7–4.0)
MCH: 33.3 pg (ref 26.0–34.0)
MCHC: 33.9 g/dL (ref 30.0–36.0)
MCV: 98.3 fL (ref 80.0–100.0)
Monocytes Absolute: 0.4 10*3/uL (ref 0.1–1.0)
Monocytes Relative: 7 %
Neutro Abs: 4.3 10*3/uL (ref 1.7–7.7)
Neutrophils Relative %: 72 %
Platelets: 224 10*3/uL (ref 150–400)
RBC: 3.57 MIL/uL — ABNORMAL LOW (ref 4.22–5.81)
RDW: 18.8 % — ABNORMAL HIGH (ref 11.5–15.5)
WBC: 5.9 10*3/uL (ref 4.0–10.5)
nRBC: 0.3 % — ABNORMAL HIGH (ref 0.0–0.2)

## 2019-11-03 LAB — COMPREHENSIVE METABOLIC PANEL
ALT: 11 U/L (ref 0–44)
AST: 16 U/L (ref 15–41)
Albumin: 3.6 g/dL (ref 3.5–5.0)
Alkaline Phosphatase: 101 U/L (ref 38–126)
Anion gap: 12 (ref 5–15)
BUN: 14 mg/dL (ref 8–23)
CO2: 24 mmol/L (ref 22–32)
Calcium: 9.8 mg/dL (ref 8.9–10.3)
Chloride: 102 mmol/L (ref 98–111)
Creatinine, Ser: 1.55 mg/dL — ABNORMAL HIGH (ref 0.61–1.24)
GFR calc Af Amer: 54 mL/min — ABNORMAL LOW (ref >60)
GFR calc non Af Amer: 47 mL/min — ABNORMAL LOW (ref >60)
Glucose, Bld: 121 mg/dL — ABNORMAL HIGH (ref 70–99)
Potassium: 3.7 mmol/L (ref 3.5–5.1)
Sodium: 138 mmol/L (ref 135–145)
Total Bilirubin: 0.4 mg/dL (ref 0.3–1.2)
Total Protein: 6.8 g/dL (ref 6.5–8.1)

## 2019-11-03 LAB — PSA: Prostatic Specific Antigen: 117.23 ng/mL — ABNORMAL HIGH (ref 0.00–4.00)

## 2019-11-03 LAB — MAGNESIUM: Magnesium: 1.9 mg/dL (ref 1.7–2.4)

## 2019-11-03 MED ORDER — HYDROCODONE-ACETAMINOPHEN 10-325 MG PO TABS: 1.0000 | ORAL_TABLET | Freq: Four times a day (QID) | ORAL | 0 refills | Status: DC | PRN

## 2019-11-03 NOTE — Progress Notes (Signed)
Matthew [name], [name] [id number]   CLINIC:  Medical Oncology/Hematology  PCP:  [name], [place] [address] [phone]   REASON FOR VISIT:  Follow-up for prostate cancer   BRIEF ONCOLOGIC HISTORY:  Oncology History  Prostate carcinoma ([place])  12/10/2015 Imaging   CTA chest, multiple thoracic and rib osseous lesions. no primary evident. Atherosclerosis including aortic and CAD   12/12/2015 Imaging   CT abdomen/pelvis with sclerotic osseous lesions througout the lumbar spine, sacrum, bilateral iliac bones and L proximal femur worrisome for sclerotic osseous mets,mild prostatomegaly, no LAD, Infrarenal 3.8 cm AAA   12/12/2015 Tumor Marker   PSA 153.85   12/18/2015 Imaging   Bone Scan Diffuse bony metastatic disease, posterior calvarium, sternum, thoracic, lumbar spine, bilateral ribs, L shoulder, bilateral bony pelvis, proximal L femur   12/24/2015 Initial Biopsy   CT biopsy of L iliac bone lesion   12/26/2015 Pathology Results   Bone, biopsy, left iliac - POSITIVE FOR METASTATIC ADENOCARCINOMA.   12/28/2015 - 09/01/2016 Chemotherapy   Firmagon instituted 240 mg    01/01/2016 Procedure   Port placed by IR.   01/07/2016 - 04/11/2016 Chemotherapy   Docetaxel every 21 days x 6 cycles with dose reductions due to tolerance   02/18/2016 Adverse Reaction   GI toxicity from docetaxel, dose reduced to 60 mg/m2   02/18/2016 Treatment Plan Change   Docetaxel dose reduced by 20%   03/06/2016 Procedure   Dr. Enrique Sack- Multiple extraction of tooth numbers 3, 5, 6, 8, 9, 10, 11, 12, 20, 21, 22, 27, 28, and 31. 4 Quadrants of alveoloplasty. Bilateral mandibular lingual tori reductions.   04/09/2016 Imaging   Brain MRI 1. No intracranial metastatic disease 2. No acute intracranial abnormality 3. Findings of chronic microvascular disease   05/14/2016 Imaging   CT C/A/P Stable appearing diffuse bony metastatic disease. No new  significant disease is seen Innumerable patchy sclerotic osseous metastases throughout the axial and proximal appendicular skeleton, stable in size and distribution, significantly increased in density in the interval, likely reflecting treatment effect. 2. No new or progressive metastatic disease in the chest, abdomen or pelvis. 3. Bilateral perifissural pulmonary nodules are stable and probably benign. 4. Aortic atherosclerosis. Infrarenal 4.3 cm abdominal aortic aneurysm, minimally increased in size. Recommend followup by ultrasound in 1 year.   06/02/2016 Tumor Marker   PSA 0.99 ng/ml    - 07/31/2016 Radiation Therapy   Palliative XRT by Dr. Lianne Cure in [place].    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  The Multi-Gene Panel offered by [facility] includes sequencing and/or deletion duplication testing of the following 85 genes: AIP, ALK, APC, ATM, AXIN2,BAP1,  BARD1, BLM, BMPR1A, BRCA1, BRCA2, BRIP1, CASR, CDC73, CDH1, CDK4, CDKN1B, CDKN1C, CDKN2A (p14ARF), CDKN2A (p16INK4a), CEBPA, CHEK2, CTNNA1, DICER1, DIS3L2, EGFR (c.2369C>T, p.Thr790Met variant only), EPCAM (Deletion/duplication testing only), FH, FLCN, GATA2, GPC3, GREM1 (Promoter region deletion/duplication testing only), HOXB13 (c.251G>A, p.Gly84Glu), HRAS, KIT, MAX, MEN1, MET, MITF (c.952G>A, p.Glu318Lys variant only), MLH1, MSH2, MSH3, MSH6, MUTYH, NBN, NF1, NF2, NTHL1, PALB2, PDGFRA, PHOX2B, PMS2, POLD1, POLE, POT1, PRKAR1A, PTCH1, PTEN, RAD50, RAD51C, RAD51D, RB1, RECQL4, RET, RNF43, RUNX1, SDHAF2, SDHA (sequence changes only), SDHB, SDHC, SDHD, SMAD4, SMARCA4, SMARCB1, SMARCE1, STK11, SUFU, TERC, TERT, TMEM127, TP53, TSC1, TSC2, VHL, WRN and WT1.  The report date is August 24, 2018  This  is not thought to increase the risk for colon cancer or other cancers, unless there is the presence of two  pathogenic mutations.   09/27/2018 - 06/27/2019 Chemotherapy   The patient had palonosetron (ALOXI) injection 0.25 mg, 0.25 mg, Intravenous,  Once, 10 of 10 cycles Administration: 0.25 mg (09/27/2018), 0.25 mg (10/25/2018), 0.25 mg (11/23/2018), 0.25 mg (12/21/2018), 0.25 mg (01/18/2019), 0.25 mg (02/15/2019), 0.25 mg (03/15/2019), 0.25 mg (04/12/2019), 0.25 mg (05/10/2019), 0.25 mg (06/07/2019) pegfilgrastim (NEULASTA ONPRO KIT) injection 6 mg, 6 mg, Subcutaneous, Once, 9 of 9 cycles Administration: 6 mg (10/25/2018), 6 mg (11/23/2018), 6 mg (12/21/2018), 6 mg (01/18/2019), 6 mg (02/15/2019), 6 mg (03/15/2019), 6 mg (04/12/2019), 6 mg (05/10/2019), 6 mg (06/07/2019) pegfilgrastim-cbqv (UDENYCA) injection 6 mg, 6 mg, Subcutaneous, Once, 1 of 1 cycle Administration: 6 mg (09/29/2018) cabazitaxel (JEVTANA) 35 mg in dextrose 5 % 250 mL chemo infusion, 15 mg/m2 = 35 mg (100 % of original dose 15 mg/m2), Intravenous,  Once, 10 of 10 cycles Dose modification: 15 mg/m2 (original dose 15 mg/m2, Cycle 1, Reason: Provider Judgment), 20 mg/m2 (original dose 15 mg/m2, Cycle 2, Reason: Provider Judgment), 20 mg/m2 (original dose 20 mg/m2, Cycle 5, Reason: Patient Age, Comment: Change in jevtana orderable), 15 mg/m2 (75 % of original dose 20 mg/m2, Cycle 8, Reason: Other (see comments), Comment: nausea, worsening numbness) Administration: 35 mg (09/27/2018), 46 mg (10/25/2018), 46 mg (11/23/2018), 46 mg (12/21/2018), 46 mg (01/18/2019), 46 mg (02/15/2019), 46 mg (03/15/2019), 35 mg (04/12/2019), 35 mg (05/10/2019), 35 mg (06/07/2019)  for chemotherapy treatment.    11/09/2019 -  Chemotherapy   The patient had palonosetron (ALOXI) injection 0.25 mg, 0.25 mg, Intravenous,  Once, 0 of 4 cycles pegfilgrastim (NEULASTA ONPRO KIT) injection 6 mg, 6 mg, Subcutaneous, Once, 0 of 4 cycles DOCEtaxel (TAXOTERE) 170 mg in sodium chloride 0.9 % 250 mL chemo infusion, 75 mg/m2, Intravenous,  Once, 0 of 4 cycles  for chemotherapy treatment.    Bone metastases  ([place])  12/26/2015 Initial Diagnosis   Bone metastases ([place])   09/27/2018 - 06/27/2019 Chemotherapy   The patient had palonosetron (ALOXI) injection 0.25 mg, 0.25 mg, Intravenous,  Once, 4 of 6 cycles Administration: 0.25 mg (09/27/2018), 0.25 mg (10/25/2018), 0.25 mg (11/23/2018), 0.25 mg (12/21/2018) pegfilgrastim (NEULASTA ONPRO KIT) injection 6 mg, 6 mg, Subcutaneous, Once, 3 of 5 cycles Administration: 6 mg (10/25/2018), 6 mg (11/23/2018), 6 mg (12/21/2018) pegfilgrastim-cbqv (UDENYCA) injection 6 mg, 6 mg, Subcutaneous, Once, 1 of 1 cycle Administration: 6 mg (09/29/2018) cabazitaxel (JEVTANA) 35 mg in dextrose 5 % 250 mL chemo infusion, 15 mg/m2 = 35 mg (100 % of original dose 15 mg/m2), Intravenous,  Once, 4 of 6 cycles Dose modification: 15 mg/m2 (original dose 15 mg/m2, Cycle 1, Reason: Provider Judgment), 20 mg/m2 (original dose 15 mg/m2, Cycle 2, Reason: Provider Judgment) Administration: 35 mg (09/27/2018), 46 mg (10/25/2018), 46 mg (11/23/2018), 46 mg (12/21/2018)  for chemotherapy treatment.    11/09/2019 -  Chemotherapy   The patient had palonosetron (ALOXI) injection 0.25 mg, 0.25 mg, Intravenous,  Once, 0 of 4 cycles pegfilgrastim (NEULASTA ONPRO KIT) injection 6 mg, 6 mg, Subcutaneous, Once, 0 of 4 cycles DOCEtaxel (TAXOTERE) 170 mg in sodium chloride 0.9 % 250 mL chemo infusion, 75 mg/m2, Intravenous,  Once, 0 of 4 cycles  for chemotherapy treatment.       CANCER STAGING: Cancer Staging Prostate carcinoma [facility]) Staging form: Prostate, AJCC 7th Edition - Clinical stage from 12/24/2015: Stage IV (TX, N0, M1b, PSA: 20 or greater) -  Signed by Baird Cancer, PA-C on 01/07/2016    INTERVAL HISTORY:  Mr. Hoben 64 y.o. male seen for follow-up of metastatic prostate cancer.  He is taking olaparib 300 mg twice daily.  Reports appetite 25% and energy levels of 50%.  Reports both back pain and left hip pain.  Requiring anywhere between 2 to 5 tablets of hydrocodone daily.  Has very mild  numbness in the extremities from previous docetaxel.  Denies any GI side effects other than occasional nausea.  He has baseline constipation and has about 2 bowel movements per week.  REVIEW OF SYSTEMS:  Review of Systems  Respiratory: Positive for shortness of breath.   Gastrointestinal: Positive for constipation and nausea.  Musculoskeletal: Positive for back pain.  Neurological: Positive for numbness.  All other systems reviewed and are negative.    PAST MEDICAL/SURGICAL HISTORY:  Past Medical History:  Diagnosis Date  . Arthritis    Left knee  . Bone cancer ([place])   . Bone metastases ([place]) 12/26/2015  . Cancer [facility])    Prostate  . DVT (deep venous thrombosis) ([place]) 2004  . Family history of prostate cancer   . Family history of stomach cancer   . Kidney stones    ~2002  . Prostate carcinoma ([place]) 12/26/2015   Past Surgical History:  Procedure Laterality Date  . CIRCUMCISION  30 years ago  . MULTIPLE EXTRACTIONS WITH ALVEOLOPLASTY N/A 03/06/2016   Procedure: Extraction of tooth #'s 3,5,6,8-12,20-22, 27, 28, and 31 with alveoloplasty and bilateral mandibular tori reductions;  Surgeon: Lenn Cal, DDS;  Location: [facility];  Service: Oral Surgery;  Laterality: N/A;     SOCIAL HISTORY:  Social History   Socioeconomic History  . Marital status: Single    Spouse name: Not on file  . Number of children: 2  . Years of education: Not on file  . Highest education level: Not on file  Occupational History  . Occupation: Construction/carpenter  Tobacco Use  . Smoking status: Current Every Day Smoker    Packs/day: 1.00    Years: 50.00    Pack years: 50.00    Types: Cigarettes    Start date: 06/16/1965  . Smokeless tobacco: Former Systems developer    Types: [place] date: 08/04/2005  Substance and Sexual Activity  . Alcohol use: No    Alcohol/week: 0.0 standard drinks  . Drug use: Yes    Types: Marijuana    Comment: THC for appetite; occ  . Sexual activity: Not on file  Other  Topics Concern  . Not on file  Social History Narrative  . Not on file   Social Determinants of Health   Financial Resource Strain:   . Difficulty of Paying Living Expenses:   Food Insecurity:   . Worried About Charity fundraiser in the Last Year:   . Arboriculturist in the Last Year:   Transportation Needs:   . Film/video editor (Medical):   Marland Kitchen Lack of Transportation (Non-Medical):   Physical Activity:   . Days of Exercise per Week:   . Minutes of Exercise per Session:   Stress:   . Feeling of Stress :   Social Connections:   . Frequency of Communication with Friends and Family:   . Frequency of Social Gatherings with Friends and Family:   . Attends Religious Services:   . Active Member of Clubs or Organizations:   . Attends Archivist Meetings:   Marland Kitchen Marital Status:  Intimate Partner Violence:   . Fear of Current or Ex-Partner:   . Emotionally Abused:   Marland Kitchen Physically Abused:   . Sexually Abused:     FAMILY HISTORY:  Family History  Problem Relation Age of Onset  . Stomach cancer Mother        dx 3s  . Aneurysm Maternal Aunt        brain  . Alcohol abuse Maternal Uncle   . Stomach cancer Maternal Grandmother        dx 50s-60s  . Prostate cancer Maternal Uncle        dx 15s    CURRENT MEDICATIONS:  Outpatient Encounter Medications as of 11/03/2019  Medication Sig  . docusate sodium (COLACE) 100 MG capsule Take 2 capsules daily to help soften the stool.  It this does not work, you can increase to 2 capsules in the morning and 2 capsules in the evening.  If you do not have a bowel movement please call the [facility].  . ondansetron (ZOFRAN) 8 MG tablet TAKE 1 TABLET BY MOUTH EVERY 8 HOURS AS NEEDED FOR NAUSEA FOR VOMITING  . prochlorperazine (COMPAZINE) 10 MG tablet Take 1 tablet (10 mg total) by mouth every 6 (six) hours as needed for nausea or vomiting.  . Calcium Carb-Cholecalciferol (CALCIUM 500 + D3) 500-600 MG-UNIT TABS Take 2  tablets by mouth daily.  . ergocalciferol (VITAMIN D2) 50000 units capsule Take 1 capsule (50,000 Units total) by mouth every Friday.  . fentaNYL (DURAGESIC) 75 MCG/HR Place 1 patch onto the skin every 3 (three) days.  Marland Kitchen HYDROcodone-acetaminophen (NORCO) 10-325 MG tablet Take 1 tablet by mouth every 6 (six) hours as needed.  . lidocaine-prilocaine (EMLA) cream Apply a quarter size amount to port site 1 hour prior to chemo. Do not rub in. Cover with plastic wrap. (Patient not taking: Reported on 06/07/2019)  . NARCAN 4 MG/0.1ML LIQD nasal spray kit Place 1 spray into the nose as needed.  Marland Kitchen olaparib (LYNPARZA) 150 MG tablet Take 2 tablets (300 mg total) by mouth 2 (two) times daily. Swallow whole. May take with food to decrease nausea and vomiting.  Marland Kitchen omeprazole (PRILOSEC) 20 MG capsule Take 1 capsule (20 mg total) by mouth daily.  . Pseudoeph-Chlorphen-DM (CHILDRENS NYQUIL COLD/COUGH PO) Take 15 mLs by mouth at bedtime.  . silodosin (RAPAFLO) 8 MG CAPS capsule Take 1 capsule (8 mg total) by mouth daily with breakfast.  . tamsulosin (FLOMAX) 0.4 MG CAPS capsule Take 0.4 mg by mouth at bedtime.  . traZODone (DESYREL) 100 MG tablet Take 1 tablet (100 mg total) by mouth at bedtime as needed for sleep.  . Wheat Dextrin (BENEFIBER PO) Take 1 Package by mouth as needed.   . [DISCONTINUED] HYDROcodone-acetaminophen (NORCO) 10-325 MG tablet Take 1 tablet by mouth every 6 (six) hours as needed.   Facility-Administered Encounter Medications as of 11/03/2019  Medication  . sodium chloride flush (NS) 0.9 % injection 10 mL    ALLERGIES:  Allergies  Allergen Reactions  . Meloxicam Other (See Comments)    Caused stomach bleeding.     PHYSICAL EXAM:  ECOG Performance status: 1  Vitals:   11/03/19 0921 11/03/19 1000  BP: 92/61 97/67  Pulse: (!) 107   Resp: 16   Temp: (!) 97.1 F (36.2 C)   SpO2: 98%    Filed Weights   11/03/19 0921  Weight: 222 lb 9.6 oz (101 kg)    Physical Exam Vitals  reviewed.  Constitutional:  Appearance: Normal appearance.  Cardiovascular:     Rate and Rhythm: Normal rate and regular rhythm.     Heart sounds: Normal heart sounds.  Pulmonary:     Effort: Pulmonary effort is normal.     Breath sounds: Normal breath sounds.  Abdominal:     General: There is no distension.     Palpations: Abdomen is soft. There is no mass.  Musculoskeletal:        General: No swelling.  Skin:    General: Skin is warm.  Neurological:     General: No focal deficit present.     Mental Status: He is alert and oriented to person, place, and time.  Psychiatric:        Mood and Affect: Mood normal.        Behavior: Behavior normal.      LABORATORY DATA:  I have reviewed the labs as listed.  CBC    Component Value Date/Time   WBC 5.9 11/03/2019 0828   RBC 3.57 (L) 11/03/2019 0828   HGB 11.9 (L) 11/03/2019 0828   HCT 35.1 (L) 11/03/2019 0828   PLT 224 11/03/2019 0828   MCV 98.3 11/03/2019 0828   MCH 33.3 11/03/2019 0828   MCHC 33.9 11/03/2019 0828   RDW 18.8 (H) 11/03/2019 0828   LYMPHSABS 1.0 11/03/2019 0828   MONOABS 0.4 11/03/2019 0828   EOSABS 0.2 11/03/2019 0828   BASOSABS 0.0 11/03/2019 0828   CMP Latest Ref Rng & Units 11/03/2019 10/05/2019 09/21/2019  Glucose 70 - 99 mg/dL 121(H) 137(H) 120(H)  BUN 8 - 23 mg/dL '14 13 9  '$ Creatinine 0.61 - 1.24 mg/dL 1.55(H) 1.34(H) 1.21  Sodium 135 - 145 mmol/L 138 137 140  Potassium 3.5 - 5.1 mmol/L 3.7 3.8 3.5  Chloride 98 - 111 mmol/L 102 101 104  CO2 22 - 32 mmol/L '24 25 24  '$ Calcium 8.9 - 10.3 mg/dL 9.8 10.4(H) 9.6  Total Protein 6.5 - 8.1 g/dL 6.8 6.8 6.5  Total Bilirubin 0.3 - 1.2 mg/dL 0.4 0.4 0.5  Alkaline Phos 38 - 126 U/L 101 95 92  AST 15 - 41 U/L '16 16 16  '$ ALT 0 - 44 U/L '11 13 11       '$ DIAGNOSTIC IMAGING:  I have reviewed the scans.    ASSESSMENT & PLAN:   Prostate carcinoma ([place]) 1.  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 -Abiraterone/prednisone, cabazitaxel with progression.  Guardant 360 showed ATM mutation. -Olaparib 300 mg twice daily started on 07/28/2019. -CT on 08/11/2019 showed 1 metastasis with no visceral mets. -We reviewed his labs.  PSA is gone up to 120. -I have recommended a bone scan.  I have recommended changing therapy. -I think it is reasonable to rechallenge him with docetaxel as he never had progression from it.  We talked about side effects in detail. -We will use Neulasta on pro as Fulphila caused him to have severe back pain and bone pains.  2.  Bone metastatic disease: -Denosumab on hold since 12/21/2018 secondary to ONJ.  It is held indefinitely.  3.  Back pain and left hip pain: -Left hip pain is rated as 4 out of 10.  Back pain rated as 6 out of 10. -He is using fentanyl 75 mcg patch.  He is also taking hydrocodone 10 mg anywhere between 2-5/day. -We will send refill for it.  4.  Mild hypercalcemia: -His calcium was 10.4 last time.  We held  his calcium and vitamin D. -Today calcium improved to 9.8.  5.  Increased creatinine: -Creatinine today is 1.55.  This is from olaparib.  We will discontinue it.     Orders placed this encounter:  Orders Placed This Encounter  Procedures  . NM Bone Scan Whole Body   Total time spent is 40 minutes with more than 50% of the time spent face-to-face discussing treatment plan change, adverse effects, counseling and coordination of care.   Derek Jack, MD [place] [phone]

## 2019-11-03 NOTE — Patient Instructions (Addendum)
[place] at [facility] Discharge Instructions  You were seen today by Dr. Delton Coombes. He went over your recent lab results. He will repeat a bone scan prior to your next visit. He is also changing your treatment. He will see you back in 2 weeks for labs, new treatment and follow up.   Thank you for choosing [place] at [facility] to provide your oncology and hematology care.  To afford each patient quality time with our provider, please arrive at least 15 minutes before your scheduled appointment time.   If you have a lab appointment with the [facility] please come in thru the  Main Entrance and check in at the main information desk  You need to re-schedule your appointment should you arrive 10 or more minutes late.  We strive to give you quality time with our providers, and arriving late affects you and other patients whose appointments are after yours.  Also, if you no show three or more times for appointments you may be dismissed from the clinic at the providers discretion.     Again, thank you for choosing [facility].  Our hope is that these requests will decrease the amount of time that you wait before being seen by our physicians.       _____________________________________________________________  Should you have questions after your visit to [facility], please contact our office at [phone] between the hours of 8:00 a.m. and 4:30 p.m.  Voicemails left after 4:00 p.m. will not be returned until the following business day.  For prescription refill requests, have your pharmacy contact our office and allow 72 hours.    Cancer Center Support Programs:   > Cancer Support Group  2nd Tuesday of the month 1pm-2pm, Journey Room

## 2019-11-03 NOTE — Progress Notes (Signed)
ON PATHWAY REGIMEN - Prostate  No Change  Continue With Treatment as Ordered.     A cycle is every 21 days.:     Cabazitaxel      Prednisone   **Always confirm dose/schedule in your pharmacy ordering system**  Patient Characteristics: Adenocarcinoma, Distant Metastases, Castration Resistant, Symptomatic, Prior Docetaxel/Docetaxel Ineligible Histology: Adenocarcinoma Therapeutic Status: Distant Metastases  Intent of Therapy: Non-Curative / Palliative Intent, Discussed with Patient

## 2019-11-03 NOTE — Progress Notes (Signed)
DISCONTINUE ON PATHWAY REGIMEN - Prostate     A cycle is every 21 days.:     Cabazitaxel      Prednisone   **Always confirm dose/schedule in your pharmacy ordering system**  REASON: Disease Progression PRIOR TREATMENT: POS83: Cabazitaxel 20 mg/m2 q21 Days + Prednisone 10 mg Daily Until Progression TREATMENT RESPONSE: Progressive Disease (PD)  START ON PATHWAY REGIMEN - Prostate     A cycle is every 21 days:     Docetaxel      Prednisone   **Always confirm dose/schedule in your pharmacy ordering system**  Patient Characteristics: Adenocarcinoma, Distant Metastases, Castration Resistant, Symptomatic, Docetaxel Eligible Histology: Adenocarcinoma Therapeutic Status: Distant Metastases  Intent of Therapy: Non-Curative / Palliative Intent, Discussed with Patient

## 2019-11-03 NOTE — Assessment & Plan Note (Signed)
1.  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 -Abiraterone/prednisone, cabazitaxel with progression.  Guardant 360 showed ATM mutation. -Olaparib 300 mg twice daily started on 07/28/2019. -CT on 08/11/2019 showed 1 metastasis with no visceral mets. -We reviewed his labs.  PSA is gone up to 120. -I have recommended a bone scan.  I have recommended changing therapy. -I think it is reasonable to rechallenge him with docetaxel as he never had progression from it.  We talked about side effects in detail. -We will use Neulasta on pro as Fulphila caused him to have severe back pain and bone pains.  2.  Bone metastatic disease: -Denosumab on hold since 12/21/2018 secondary to ONJ.  It is held indefinitely.  3.  Back pain and left hip pain: -Left hip pain is rated as 4 out of 10.  Back pain rated as 6 out of 10. -He is using fentanyl 75 mcg patch.  He is also taking hydrocodone 10 mg anywhere between 2-5/day. -We will send refill for it.  4.  Mild hypercalcemia: -His calcium was 10.4 last time.  We held his calcium and vitamin D. -Today calcium improved to 9.8.  5.  Increased creatinine: -Creatinine today is 1.55.  This is from olaparib.  We will discontinue it.

## 2019-11-10 ENCOUNTER — Ambulatory Visit (HOSPITAL_COMMUNITY)
Admission: RE | Admit: 2019-11-10 | Discharge: 2019-11-10 | Disposition: A | Discharge status: Home / Self Care | Payer: Medicare Other | Source: Ambulatory Visit | Attending: Hematology | Admitting: Hematology

## 2019-11-10 ENCOUNTER — Other Ambulatory Visit: Payer: Self-pay

## 2019-11-10 ENCOUNTER — Encounter (HOSPITAL_COMMUNITY): Payer: Self-pay

## 2019-11-10 DIAGNOSIS — C61 Malignant neoplasm of prostate: Secondary | ICD-10-CM

## 2019-11-10 DIAGNOSIS — C7951 Secondary malignant neoplasm of bone: Secondary | ICD-10-CM

## 2019-11-10 MED ORDER — TECHNETIUM TC 99M MEDRONATE IV KIT
20.0000 | PACK | Freq: Once | INTRAVENOUS | Status: AC | PRN
  Administered 2019-11-10: 21.6 via INTRAVENOUS

## 2019-11-14 ENCOUNTER — Other Ambulatory Visit (HOSPITAL_COMMUNITY): Payer: Self-pay | Admitting: *Deleted

## 2019-11-14 DIAGNOSIS — G893 Neoplasm related pain (acute) (chronic): Secondary | ICD-10-CM

## 2019-11-14 DIAGNOSIS — C61 Malignant neoplasm of prostate: Secondary | ICD-10-CM

## 2019-11-14 MED ORDER — FENTANYL 75 MCG/HR TD PT72: 1.0000 | MEDICATED_PATCH | TRANSDERMAL | 0 refills | Status: DC

## 2019-11-16 NOTE — Progress Notes (Signed)
.   Pharmacist Chemotherapy Monitoring - Initial Assessment    Anticipated start date: 11/21/19   Regimen:  . Are orders appropriate based on the patient's diagnosis, regimen, and cycle? Yes . Does the plan date match the patient's scheduled date? Yes . Is the sequencing of drugs appropriate? Yes . Are the premedications appropriate for the patient's regimen? Yes . Prior Authorization for treatment is: Approved o If applicable, is the correct biosimilar selected based on the patient's insurance? not applicable  Organ Function and Labs: Marland Kitchen Are dose adjustments needed based on the patient's renal function, hepatic function, or hematologic function? No . Are appropriate labs ordered prior to the start of patient's treatment? Yes . Other organ system assessment, if indicated: N/A . The following baseline labs, if indicated, have been ordered: N/A  Dose Assessment: . Are the drug doses appropriate? Yes . Are the following correct: o Drug concentrations Yes o IV fluid compatible with drug Yes o Administration routes Yes o Timing of therapy Yes . If applicable, does the patient have documented access for treatment and/or plans for port-a-cath placement? yes . If applicable, have lifetime cumulative doses been properly documented and assessed? not applicable Lifetime Dose Tracking  No doses have been documented on this patient for the following tracked chemicals: Doxorubicin, Epirubicin, Idarubicin, Daunorubicin, Mitoxantrone, Bleomycin, Oxaliplatin, Carboplatin, Liposomal Doxorubicin  o   Toxicity Monitoring/Prevention: . The patient has the following take home antiemetics prescribed: Ondansetron . The patient has the following take home medications prescribed: N/A . Medication allergies and previous infusion related reactions, if applicable, have been reviewed and addressed. Yes . The patient's current medication list has been assessed for drug-drug interactions with their chemotherapy  regimen. no significant drug-drug interactions were identified on review.  Order Review: . Are the treatment plan orders signed? No . Is the patient scheduled to see a provider prior to their treatment? Yes  I verify that I have reviewed each item in the above checklist and answered each question accordingly.  Wynona Neat 11/16/2019 1:25 PM

## 2019-11-18 NOTE — Patient Instructions (Addendum)
Medications you will receive in the clinic prior to your chemotherapy medications:  Aloxi:  ALOXI is used in adults to help prevent the nausea and vomiting that happens with certain chemotherapy drugs.  Aloxi is a long acting medication, and will remain in your system for about 2 days.  DO NOT use your Zofran for nausea until the third day after your treatments.  Zofran and Aloxi are similar medications and can cause adverse side effects if taken together.  If you need something for nausea during the 2 days after your treatments, use the Compazine 1 tablet every 6 hours.  On the third day after treatments, it is fine to use your Zofran if needed for nausea.   Dexamethasone:  This is a steroid given prior to chemotherapy to help prevent allergic reactions; it may also help prevent and control nausea and diarrhea.    Docetaxel (Taxotere)  About This Drug  Docetaxel is used to treat cancer. It is given in the vein (IV) through your port a cath.  It will take 1 hour to infuse. Your first infusion will take longer than 1 hour due to the fact that we start it at a very slow rate and gradually increase the rate until the maximum infusion rate is reached.  This is done in order to monitor you closely for allergic/infusion reactions.  Your nurse will remain in the room with you for the first 15 minutes of this infusion on your first time getting it.  If you tolerate the first infusion without adverse reactions, going forward we will give you this drug at the normal infusion rate over 1 hour.   Possible Side Effects . Bone marrow suppression. This is a decrease in the number of white blood cells, red blood cells, and platelets. This may raise your risk of infection, make you tired and weak (fatigue), and raise your risk of bleeding.  . Fever in the setting of decreased white blood cells, which is a serious condition that can be lifethreatening  . Soreness of the mouth and throat. You may have red areas,  white patches, or sores that hurt.  . Nausea and vomiting (throwing up)  . Constipation (not able to move bowels)  . Diarrhea (loose bowel movements)  . Infections  . Swelling of your legs, ankles and/or feet, or fluid build-up around your lungs, heart or elsewhere   . Changes in the way food and drinks taste  . Effects on the nerves are called peripheral neuropathy. You may feel numbness, tingling, or pain in your hands and feet. It may be hard for you to button your clothes, open jars, or walk as usual. The effect on the nerves may get worse with more doses of the drug. These effects get better in some people after the drug is stopped but it does not get better in all people.  . Decreased appetite (decreased hunger)  . Weakness  . Pain  . Muscle pain/aching  . Trouble breathing  . Changes in your nail color, you may have nail loss and/or brittle nail   . Hair loss. Hair loss is often temporary, although there have been cases of permanent hair loss reported. Hair loss may happen suddenly or gradually. If you lose hair, you may lose it from your head, face, armpits, pubic area, chest, and/or legs. You may also notice your hair getting thin.  . Allergic skin reaction. You may develop blisters on your skin that are filled with fluid or a severe red  rash all over your body that may be painful.  . Allergic reactions, including anaphylaxis are rare but may happen in some patients. Signs of allergic reaction to this drug may be swelling of the face, feeling like your tongue or throat are swelling, trouble breathing, rash, itching, fever, chills, feeling dizzy, and/or feeling that your heart is beating in a fast or not normal way. If this happens, do not take another dose of this drug. You should get urgent medical treatment.  Note: Not all possible side effects are included above.  Warnings and Precautions . Severe bone marrow suppression, including febrile neutropenia - fever in the  setting of decreased white blood cells, which may be life threatening.  . Severe allergic reactions, including anaphylaxis which can be life-threatening  . Swelling (inflammation) in the colon in the setting of severely low white blood cells, which raises your risk of infection and can be life-threatening  . Severe skin reactions, including redness, swelling or peeling of skin  . Severe swelling in the eye or other changes in eyesight  . Severe swelling of your legs, ankles and/or feet. Sometimes, fluid can build up in your lungs and/or around your heart causing you trouble breathing.  . If you have a history of abnormal liver function, receive high doses of docetaxel, or have a history of lung cancer and have received treatment with a platinum (type of chemotherapy medication), you have an increased risk of death.  . Severe weakness  . This drug may raise your risk of getting a second cancer such as leukemia and myelodysplastic syndrome.  . Severe peripheral neuropathy - numbness, tingling, or pain in your hands and feet . This drug contains alcohol and may affect your central nervous system. The central nervous system is made up of your brain and spinal cord. You may feel drunk during and after your treatment and it can impair your ability to drive or use machinery for one to two hours after infusion.   . Tumor lysis syndrome: This drug may act on the cancer cells very quickly. This may affect how your kidneys work.  Note: Some of the side effects above are very rare. If you have concerns and/or questions, please discuss them with your medical team.  Important Information . This drug may be present in the saliva, tears, sweat, urine, stool, vomit, semen, and vaginal secretions. Talk to your doctor and/or your nurse about the necessary precautions to take during this time.  Treating Side Effects . Manage tiredness by pacing your activities for the day.  . Be sure to include periods of  rest between energy-draining activities.  . Get regular exercise. If you feel too tired to exercise vigorously, try taking a short walk.  . To decrease the risk of infection, wash your hands regularly.  . Avoid close contact with people who have a cold, the flu, or other infections.  . Take your temperature as your doctor or nurse tells you, and whenever you feel like you may have a fever.  . To help decrease the risk of bleeding, use a soft toothbrush. Check with your nurse before using dental floss.  . Be very careful when using knives or tools.  . Use an electric shaver instead of a razor.  . Mouth care is very important and will help food taste better and improve your appetite. Your mouth care should consist of routine, gentle cleaning of your teeth or dentures and rinsing your mouth with a mixture of 1/2 teaspoon  of salt in 8 ounces of water or 1/2 teaspoon of baking soda in 8 ounces of water. This should be done at least after each meal and at bedtime.  . If you have mouth sores, avoid mouthwash that has alcohol. Also avoid alcohol and smoking because they can bother your mouth and throat.  . Ask your doctor or nurse about medicines that are available to help stop or lessen constipation and/or diarrhea.  . If you are not able to move your bowels, check with your doctor or nurse before you use enemas, laxatives, or suppositories.  . Drink plenty of fluids (a minimum of eight glasses per day - 64 oz -  is recommended).  . If you throw up or have loose bowel movements, you should drink more fluids so that you do not become dehydrated (lack of water in the body from losing too much fluid).  . If you have diarrhea, eat low-fiber foods that are high in protein and calories and avoid foods that can irritate your digestive tracts or lead to cramping.  . To help with nausea and vomiting, eat small, frequent meals instead of three large meals a day. Choose foods and drinks that are at room  temperature. Ask your nurse or doctor about other helpful tips and medicine that is available to help stop or lessen these symptoms.  . To help with decreased appetite, eat foods high in calories and protein, such as meat, poultry, fish, dry beans, tofu, eggs, nuts, milk, yogurt, cheese, ice cream, pudding, and nutritional supplements.  . Consider using sauces and spices to increase taste. Daily exercise, with your doctor's approval, may increase your appetite.  Marland Kitchen Keeping your pain under control is important to your well-being. Please tell your doctor or nurse if you are experiencing pain.  . If you get a rash do not put anything on it unless your doctor or nurse says you may. Keep the area around the rash clean and dry. Ask your doctor for medicine if your rash bothers you.  Marland Kitchen Keeping your nails moisturized may help with brittleness.  . To help with hair loss, wash with a mild shampoo and avoid washing your hair every day.  . Avoid rubbing your scalp, pat your hair or scalp dry.  . Avoid coloring your hair.  . Limit your use of hair spray, electric curlers, blow dryers, and curling irons.  . If you are interested in getting a wig, talk to your nurse. You can also call the [facility] at [phone]-[phone] to find out information about the "Look Good, Feel Better" program close to where you live. It is a free program where women getting chemotherapy can learn about wigs, turbans and scarves as well as makeup techniques and skin and nail care.  . If you have numbness and tingling in your hands and feet, be careful when cooking, walking, and handling sharp objects and hot liquids.  Food and Drug Interactions . There are no known interactions of docetaxel with food.  . This drug may interact with other medicines. Tell your doctor and pharmacist about all the prescription and over-the-counter medicines and dietary supplements (vitamins, minerals, herbs and others) that you are taking at  this time. Also, check with your doctor or pharmacist before starting any new prescription or over-the-counter medicines, or dietary supplements to make sure that there are no interactions.  When to Call the Doctor Call your doctor or nurse if you have any of these symptoms and/or any new  or unusual symptoms:  . Fever of 100.4 F (38 C) or higher  . Chills  . Blurred vision or other changes in eyesight  . Easy bruising or bleeding  . Wheezing or trouble breathing  . Chest pain  . Feeling dizzy or lightheaded  . Tiredness that interferes with your daily activities  . Pain in your mouth or throat that makes it hard to eat or drink  . Nausea that stops you from eating or drinking and/or is not relieved by prescribed medicines  . Throwing up  . Lasting loss of appetite or rapid weight loss of five pounds in a week  . Diarrhea, 4 times in one day or diarrhea with lack of strength or a feeling of being dizzy  . No bowel movement in 3 days or when you feel uncomfortable  . Severe abdominal pain that does not go away  . Blood in your stool  . Numbness, tingling, or pain in your hands and feet  . Swelling of legs, ankles, or feet  . Weight gain of 5 pounds in one week (fluid retention)  . Extreme weakness that interferes with normal activities  . New rash and/or itching  . Rash that is not relieved by prescribed medicines  . Signs of inflammation/infection (redness, swelling, pain) of the tissue around your nails.  . Signs of allergic reaction: swelling of the face, feeling like your tongue or throat are swelling, trouble breathing, rash, itching, fever, chills, feeling dizzy, and/or feeling that your heart is beating in a fast or not normal way. If this happens, call 911 for emergency care.  . Flu-like symptoms: fever, headache, muscle and joint aches, and fatigue (low energy, feeling weak)  . Signs of possible liver problems: dark urine, pale bowel movements, bad  stomach pain, feeling very tired and weak, unusual itching, or yellowing of the eyes or skin  . Symptoms of being drunk, confusion, or being very sleepy  . Confusion or agitation, decreased urine, nausea/vomiting, diarrhea, muscle cramping, numbness and/or tingling, seizures  . General pain that does not go away or is not relieved by prescribed medicine  . If you think you may be pregnant or have impregnated your partner  Reproduction Warnings  . Pregnancy warning: This drug can have harmful effects on the unborn baby. Women of childbearing potential should use effective methods of birth control during your cancer treatment and for 6 months after treatment. Men with male partners of childbearing potential should use effective methods of birth control during your cancer treatment and for 3 months after your cancer treatment. Let your doctor know right away if you think you may be pregnant or may have impregnated your partner.  . Breastfeeding warning: Women should not breastfeed during treatment and for 1 week after treatment because this drug could enter the breast milk and cause harm to a breastfeeding baby.   . Fertility warning: In men, this drug may affect your ability to have children in the future. Talk with your doctor or nurse if you plan to have children. Ask for information on sperm banking.   SELF CARE ACTIVITIES WHILE RECEIVING CHEMOTHERAPY:  Hydration Increase your fluid intake 48 hours prior to treatment and drink at least 8 to 12 cups (64 ounces) of water/decaffeinated beverages per day after treatment. You can still have your cup of coffee or soda but these beverages do not count as part of your 8 to 12 cups that you need to drink daily. No alcohol intake.  Medications  Continue taking your normal prescription medication as prescribed.  If you start any new herbal or new supplements please let us know first to make sure it is safe.  Mouth Care Have teeth cleaned  professionally before starting treatment. Keep dentures and partial plates clean. Use soft toothbrush and do not use mouthwashes that contain alcohol. Biotene is a good mouthwash that is available at most pharmacies or may be ordered by calling [phone]. Use warm salt water gargles (1 teaspoon salt per 1 quart warm water) before and after meals and at bedtime. If you need dental work, please let the doctor know before you go for your appointment so that we can coordinate the best possible time for you in regards to your chemo regimen. You need to also let your dentist know that you are actively taking chemo. We may need to do labs prior to your dental appointment.  Skin Care Always use sunscreen that has not expired and with SPF (Sun Protection Factor) of 50 or higher. Wear hats to protect your head from the sun. Remember to use sunscreen on your hands, ears, face, & feet.  Use good moisturizing lotions such as udder cream, eucerin, or even Vaseline. Some chemotherapies can cause dry skin, color changes in your skin and nails.    . Avoid long, hot showers or baths. . Use gentle, fragrance-free soaps and laundry detergent. . Use moisturizers, preferably creams or ointments rather than lotions because the thicker consistency is better at preventing skin dehydration. Apply the cream or ointment within 15 minutes of showering. Reapply moisturizer at night, and moisturize your hands every time after you wash them.  Hair Loss (if your doctor says your hair will fall out)  . If your doctor says that your hair is likely to fall out, decide before you begin chemo whether you want to wear a wig. You may want to shop before treatment to match your hair color. . Hats, turbans, and scarves can also camouflage hair loss, although some people prefer to leave their heads uncovered. If you go bare-headed outdoors, be sure to use sunscreen on your scalp. . Cut your hair short. It eases the inconvenience of shedding  lots of hair, but it also can reduce the emotional impact of watching your hair fall out. . Don't perm or color your hair during chemotherapy. Those chemical treatments are already damaging to hair and can enhance hair loss. Once your chemo treatments are done and your hair has grown back, it's OK to resume dyeing or perming hair.  With chemotherapy, hair loss is almost always temporary. But when it grows back, it may be a different color or texture. In older adults who still had hair color before chemotherapy, the new growth may be completely gray.  Often, new hair is very fine and soft.  Infection Prevention Please wash your hands for at least 30 seconds using warm soapy water. Handwashing is the #1 way to prevent the spread of germs. Stay away from sick people or people who are getting over a cold. If you develop respiratory systems such as green/yellow mucus production or productive cough or persistent cough let us know and we will see if you need an antibiotic. It is a good idea to keep a pair of gloves on when going into grocery stores/[employer] to decrease your risk of coming into contact with germs on the carts, etc. Carry alcohol hand gel with you at all times and use it frequently if out in public. If  your temperature reaches 100.5 or higher please call the clinic and let us know.  If it is after hours or on the weekend please go to the ER if your temperature is over 100.5.  Please have your own personal thermometer at home to use.    Sex and bodily fluids If you are going to have sex, a condom must be used to protect the person that isn't taking chemotherapy. Chemo can decrease your libido (sex drive). For a few days after chemotherapy, chemotherapy can be excreted through your bodily fluids.  When using the toilet please close the lid and flush the toilet twice.  Do this for a few day after you have had chemotherapy.   Effects of chemotherapy on your sex life Some changes are simple and won't  last long. They won't affect your sex life permanently.  Sometimes you may feel: . too tired . not strong enough to be very active . sick or sore  . not in the mood . anxious or low Your anxiety might not seem related to sex. For example, you may be worried about the cancer and how your treatment is going. Or you may be worried about money, or about how you family are coping with your illness.  These things can cause stress, which can affect your interest in sex. It's important to talk to your partner about how you feel.  Remember - the changes to your sex life don't usually last long. There's usually no medical reason to stop having sex during chemo. The drugs won't have any long term physical effects on your performance or enjoyment of sex. Cancer can't be passed on to your partner during sex  Contraception It's important to use reliable contraception during treatment. Avoid getting pregnant while you or your partner are having chemotherapy. This is because the drugs may harm the baby. Sometimes chemotherapy drugs can leave a man or woman infertile.  This means you would not be able to have children in the future. You might want to talk to someone about permanent infertility. It can be very difficult to learn that you may no longer be able to have children. Some people find counselling helpful. There might be ways to preserve your fertility, although this is easier for men than for women. You may want to speak to a fertility expert. You can talk about sperm banking or harvesting your eggs. You can also ask about other fertility options, such as donor eggs. If you have or have had breast cancer, your doctor might advise you not to take the contraceptive pill. This is because the hormones in it might affect the cancer. It is not known for sure whether or not chemotherapy drugs can be passed on through semen or secretions from the vagina. Because of this some doctors advise people to use a barrier method  if you have sex during treatment. This applies to vaginal, anal or oral sex. Generally, doctors advise a barrier method only for the time you are actually having the treatment and for about a week after your treatment. Advice like this can be worrying, but this does not mean that you have to avoid being intimate with your partner. You can still have close contact with your partner and continue to enjoy sex.  Animals If you have cats or birds we just ask that you not change the litter or change the cage.  Please have someone else do this for you while you are on chemotherapy.   Food Safety  During and After Cancer Treatment Food safety is important for people both during and after cancer treatment. Cancer and cancer treatments, such as chemotherapy, radiation therapy, and stem cell/bone marrow transplantation, often weaken the immune system. This makes it harder for your body to protect itself from foodborne illness, also called food poisoning. Foodborne illness is caused by eating food that contains harmful bacteria, parasites, or viruses.  Foods to avoid Some foods have a higher risk of becoming tainted with bacteria. These include: Marland Kitchen Unwashed fresh fruit and vegetables, especially leafy vegetables that can hide dirt and other contaminants . Raw sprouts, such as alfalfa sprouts . Raw or undercooked beef, especially ground beef, or other raw or undercooked meat and poultry . Fatty, fried, or spicy foods immediately before or after treatment.  These can sit heavy on your stomach and make you feel nauseous. . Raw or undercooked shellfish, such as oysters. . Sushi and sashimi, which often contain raw fish.  . Unpasteurized beverages, such as unpasteurized fruit juices, raw milk, raw yogurt, or cider . Undercooked eggs, such as soft boiled, over easy, and poached; raw, unpasteurized eggs; or foods made with raw egg, such as homemade raw cookie dough and homemade mayonnaise  Simple steps for food  safety  Shop smart. . Do not buy food stored or displayed in an unclean area. . Do not buy bruised or damaged fruits or vegetables. . Do not buy cans that have cracks, dents, or bulges. . Pick up foods that can spoil at the end of your shopping trip and store them in a cooler on the way home.  Prepare and clean up foods carefully. . Rinse all fresh fruits and vegetables under running water, and dry them with a clean towel or paper towel. . Clean the top of cans before opening them. . After preparing food, wash your hands for 20 seconds with hot water and soap. Pay special attention to areas between fingers and under nails. . Clean your utensils and dishes with hot water and soap. Marland Kitchen Disinfect your kitchen and cutting boards using 1 teaspoon of liquid, unscented bleach mixed into 1 quart of water.    Dispose of old food. . Eat canned and packaged food before its expiration date (the "use by" or "best before" date). . Consume refrigerated leftovers within 3 to 4 days. After that time, throw out the food. Even if the food does not smell or look spoiled, it still may be unsafe. Some bacteria, such as Listeria, can grow even on foods stored in the refrigerator if they are kept for too long.  Take precautions when eating out. . At restaurants, avoid buffets and salad bars where food sits out for a long time and comes in contact with many people. Food can become contaminated when someone with a virus, often a norovirus, or another "bug" handles it. . Put any leftover food in a "to-go" container yourself, rather than having the server do it. And, refrigerate leftovers as soon as you get home. . Choose restaurants that are clean and that are willing to prepare your food as you order it cooked.   AT HOME MEDICATIONS:  Compazine/Prochlorperazine  10mg  tablet. Take 1 tablet every 6 hours as needed for nausea/vomiting. (This can make you sleepy)   EMLA cream. Apply a quarter size amount to port site 1 hour prior to chemo. Do not rub in. Cover with plastic wrap.    Diarrhea Sheet   If you are having loose stools/diarrhea, please purchase Imodium and begin taking as outlined:  At the first sign of poorly formed or loose stools you should begin taking Imodium (loperamide) 2 mg capsules. Take two tablets (4mg ) followed by one tablet (2mg ) every 2 hours - DO NOT EXCEED 8 tablets in 24 hours.  If it is bedtime and you are having loose stools, take 2 tablets at bedtime, then 2 tablets every 4 hours until morning.   Always call the [facility] if you are having loose stools/diarrhea that you can't get under control.  Loose stools/diarrhea leads to dehydration (loss of water) in your body.  We have other options of trying to get the loose stools/diarrhea to stop but you must let us know!   Constipation Sheet  Colace - 100 mg capsules - take 2 capsules daily.  If this doesn't help then you can increase to 2 capsules twice daily.  Please call if the above does not work for you. Do not go more than 2 days without a bowel movement.  It is very important that you do not become constipated.  It will make you feel sick to your stomach (nausea) and can cause abdominal pain and vomiting.  Nausea Sheet   Compazine/Prochlorperazine 10mg  tablet. Take 1 tablet every 6 hours as needed for nausea/vomiting (This can make you drowsy).  If you are having persistent nausea (nausea that does not stop) please call the [facility] and let us know the amount of nausea that you are experiencing.  If you begin to vomit, you need to call the [facility] and if it is the weekend and you have vomited more than one time and can't get it to stop-go to the Emergency Room.  Persistent nausea/vomiting can lead to dehydration (loss of fluid in your body) and will make you  feel very weak and unwell. Ice chips, sips of clear liquids, foods that are at room temperature, crackers, and toast tend to be better tolerated.   SYMPTOMS TO REPORT AS SOON AS POSSIBLE AFTER TREATMENT:  FEVER GREATER THAN 100.4 F  CHILLS WITH OR WITHOUT FEVER  NAUSEA AND VOMITING THAT IS NOT CONTROLLED WITH YOUR NAUSEA MEDICATION  UNUSUAL SHORTNESS OF BREATH  UNUSUAL BRUISING OR BLEEDING  TENDERNESS IN MOUTH AND THROAT WITH OR WITHOUT   PRESENCE OF ULCERS  URINARY PROBLEMS  BOWEL PROBLEMS  UNUSUAL RASH     Wear comfortable clothing and clothing appropriate for easy access to any Portacath or PICC line. Let us know if there is anything that we can do to make your therapy better!    What to do if you need assistance after hours or on the weekends: CALL [phone].  HOLD on the line, do not hang up.  You will hear multiple messages but at the end you will be connected with a nurse triage line.  They will contact the doctor if necessary.  Most of the time they will be able to assist you.  Do not call the hospital operator.      I have been informed and understand all of the instructions given to me and have received a copy. I have been instructed to call the clinic (  [phone] or my family physician as soon as possible for continued medical care, if indicated. I do not have any more questions at this time but understand that I may call the [facility] or the Patient Navigator at [phone] during office hours should I have questions or need assistance in obtaining follow-up care.

## 2019-11-21 ENCOUNTER — Other Ambulatory Visit: Payer: Self-pay

## 2019-11-21 ENCOUNTER — Inpatient Hospital Stay (HOSPITAL_COMMUNITY): Payer: Medicare Other

## 2019-11-21 ENCOUNTER — Inpatient Hospital Stay (HOSPITAL_BASED_OUTPATIENT_CLINIC_OR_DEPARTMENT_OTHER): Payer: Medicare Other | Admitting: Hematology

## 2019-11-21 VITALS — BP 100/62 | HR 84 | Temp 97.5°F | Resp 17

## 2019-11-21 DIAGNOSIS — M25552 Pain in left hip: Secondary | ICD-10-CM | POA: Diagnosis not present

## 2019-11-21 DIAGNOSIS — C61 Malignant neoplasm of prostate: Secondary | ICD-10-CM | POA: Diagnosis not present

## 2019-11-21 DIAGNOSIS — C7951 Secondary malignant neoplasm of bone: Secondary | ICD-10-CM

## 2019-11-21 DIAGNOSIS — Z79899 Other long term (current) drug therapy: Secondary | ICD-10-CM | POA: Diagnosis not present

## 2019-11-21 DIAGNOSIS — Z8042 Family history of malignant neoplasm of prostate: Secondary | ICD-10-CM | POA: Diagnosis not present

## 2019-11-21 DIAGNOSIS — M549 Dorsalgia, unspecified: Secondary | ICD-10-CM | POA: Diagnosis not present

## 2019-11-21 DIAGNOSIS — F17210 Nicotine dependence, cigarettes, uncomplicated: Secondary | ICD-10-CM | POA: Diagnosis not present

## 2019-11-21 DIAGNOSIS — Z800 Family history of malignant neoplasm of digestive organs: Secondary | ICD-10-CM | POA: Diagnosis not present

## 2019-11-21 DIAGNOSIS — Z5111 Encounter for antineoplastic chemotherapy: Secondary | ICD-10-CM | POA: Diagnosis not present

## 2019-11-21 LAB — CBC WITH DIFFERENTIAL/PLATELET
Abs Immature Granulocytes: 0.02 10*3/uL (ref 0.00–0.07)
Basophils Absolute: 0 10*3/uL (ref 0.0–0.1)
Basophils Relative: 1 %
Eosinophils Absolute: 0.2 10*3/uL (ref 0.0–0.5)
Eosinophils Relative: 3 %
HCT: 36.8 % — ABNORMAL LOW (ref 39.0–52.0)
Hemoglobin: 12.2 g/dL — ABNORMAL LOW (ref 13.0–17.0)
Immature Granulocytes: 0 %
Lymphocytes Relative: 19 %
Lymphs Abs: 1 10*3/uL (ref 0.7–4.0)
MCH: 34 pg (ref 26.0–34.0)
MCHC: 33.2 g/dL (ref 30.0–36.0)
MCV: 102.5 fL — ABNORMAL HIGH (ref 80.0–100.0)
Monocytes Absolute: 0.4 10*3/uL (ref 0.1–1.0)
Monocytes Relative: 8 %
Neutro Abs: 3.9 10*3/uL (ref 1.7–7.7)
Neutrophils Relative %: 69 %
Platelets: 217 10*3/uL (ref 150–400)
RBC: 3.59 MIL/uL — ABNORMAL LOW (ref 4.22–5.81)
RDW: 15.8 % — ABNORMAL HIGH (ref 11.5–15.5)
WBC: 5.6 10*3/uL (ref 4.0–10.5)
nRBC: 0 % (ref 0.0–0.2)

## 2019-11-21 LAB — COMPREHENSIVE METABOLIC PANEL
ALT: 12 U/L (ref 0–44)
AST: 17 U/L (ref 15–41)
Albumin: 3.6 g/dL (ref 3.5–5.0)
Alkaline Phosphatase: 96 U/L (ref 38–126)
Anion gap: 14 (ref 5–15)
BUN: 12 mg/dL (ref 8–23)
CO2: 22 mmol/L (ref 22–32)
Calcium: 9.6 mg/dL (ref 8.9–10.3)
Chloride: 104 mmol/L (ref 98–111)
Creatinine, Ser: 1.18 mg/dL (ref 0.61–1.24)
GFR calc Af Amer: >60 mL/min (ref >60)
GFR calc non Af Amer: >60 mL/min (ref >60)
Glucose, Bld: 134 mg/dL — ABNORMAL HIGH (ref 70–99)
Potassium: 3.4 mmol/L — ABNORMAL LOW (ref 3.5–5.1)
Sodium: 140 mmol/L (ref 135–145)
Total Bilirubin: 0.6 mg/dL (ref 0.3–1.2)
Total Protein: 6.5 g/dL (ref 6.5–8.1)

## 2019-11-21 LAB — PSA: Prostatic Specific Antigen: 132.27 ng/mL — ABNORMAL HIGH (ref 0.00–4.00)

## 2019-11-21 LAB — MAGNESIUM: Magnesium: 1.9 mg/dL (ref 1.7–2.4)

## 2019-11-21 MED ORDER — SODIUM CHLORIDE 0.9 % IV SOLN: Freq: Once | INTRAVENOUS | Status: AC

## 2019-11-21 MED ORDER — SODIUM CHLORIDE 0.9% FLUSH
10.0000 mL | INTRAVENOUS | Status: DC | PRN
  Administered 2019-11-21: 10 mL

## 2019-11-21 MED ORDER — SODIUM CHLORIDE 0.9 % IV SOLN
60.0000 mg/m2 | Freq: Once | INTRAVENOUS | Status: AC
  Administered 2019-11-21: 12:00:00 140 mg via INTRAVENOUS
  Filled 2019-11-21: qty 14

## 2019-11-21 MED ORDER — PEGFILGRASTIM 6 MG/0.6ML ~~LOC~~ PSKT
PREFILLED_SYRINGE | SUBCUTANEOUS | Status: AC
  Filled 2019-11-21: qty 0.6

## 2019-11-21 MED ORDER — PEGFILGRASTIM 6 MG/0.6ML ~~LOC~~ PSKT
6.0000 mg | PREFILLED_SYRINGE | Freq: Once | SUBCUTANEOUS | Status: AC
  Administered 2019-11-21: 6 mg via SUBCUTANEOUS

## 2019-11-21 MED ORDER — HEPARIN SOD (PORK) LOCK FLUSH 100 UNIT/ML IV SOLN
500.0000 [IU] | Freq: Once | INTRAVENOUS | Status: AC | PRN
  Administered 2019-11-21: 500 [IU]

## 2019-11-21 MED ORDER — PALONOSETRON HCL INJECTION 0.25 MG/5ML
0.2500 mg | Freq: Once | INTRAVENOUS | Status: AC
  Administered 2019-11-21: 0.25 mg via INTRAVENOUS
  Filled 2019-11-21: qty 5

## 2019-11-21 MED ORDER — SODIUM CHLORIDE 0.9 % IV SOLN
10.0000 mg | Freq: Once | INTRAVENOUS | Status: AC
  Administered 2019-11-21: 11:00:00 10 mg via INTRAVENOUS
  Filled 2019-11-21: qty 10

## 2019-11-21 NOTE — Patient Instructions (Addendum)
[facility] at [facility] Discharge Instructions  You were seen today by Dr. Katragadda. He went over your recent lab results. He will see you back in 3 weeks for labs, treatment and follow up.   Thank you for choosing [facility] at [facility] to provide your oncology and hematology care.  To afford each patient quality time with our provider, please arrive at least 15 minutes before your scheduled appointment time.   If you have a lab appointment with the Cancer Center please come in thru the  Main Entrance and check in at the main information desk  You need to re-schedule your appointment should you arrive 10 or more minutes late.  We strive to give you quality time with our providers, and arriving late affects you and other patients whose appointments are after yours.  Also, if you no show three or more times for appointments you may be dismissed from the clinic at the providers discretion.     Again, thank you for choosing [facility].  Our hope is that these requests will decrease the amount of time that you wait before being seen by our physicians.       _____________________________________________________________  Should you have questions after your visit to [facility], please contact our office at [phone] between the hours of 8:00 a.m. and 4:30 p.m.  Voicemails left after 4:00 p.m. will not be returned until the following business day.  For prescription refill requests, have your pharmacy contact our office and allow 72 hours.    Cancer Center Support Programs:   > Cancer Support Group  2nd Tuesday of the month 1pm-2pm, Journey Room    

## 2019-11-21 NOTE — Progress Notes (Signed)
Matthew [name], [name] [id number]   CLINIC:  Medical Oncology/Hematology  PCP:  [name], [place] [address] [phone]   REASON FOR VISIT:  Follow-up for prostate cancer   BRIEF ONCOLOGIC HISTORY:  Oncology History  Prostate carcinoma ([place])  12/10/2015 Imaging   CTA chest, multiple thoracic and rib osseous lesions. no primary evident. Atherosclerosis including aortic and CAD   12/12/2015 Imaging   CT abdomen/pelvis with sclerotic osseous lesions througout the lumbar spine, sacrum, bilateral iliac bones and L proximal femur worrisome for sclerotic osseous mets,mild prostatomegaly, no LAD, Infrarenal 3.8 cm AAA   12/12/2015 Tumor Marker   PSA 153.85   12/18/2015 Imaging   Bone Scan Diffuse bony metastatic disease, posterior calvarium, sternum, thoracic, lumbar spine, bilateral ribs, L shoulder, bilateral bony pelvis, proximal L femur   12/24/2015 Initial Biopsy   CT biopsy of L iliac bone lesion   12/26/2015 Pathology Results   Bone, biopsy, left iliac - POSITIVE FOR METASTATIC ADENOCARCINOMA.   12/28/2015 - 09/01/2016 Chemotherapy   Firmagon instituted 240 mg    01/01/2016 Procedure   Port placed by IR.   01/07/2016 - 04/11/2016 Chemotherapy   Docetaxel every 21 days x 6 cycles with dose reductions due to tolerance   02/18/2016 Adverse Reaction   GI toxicity from docetaxel, dose reduced to 60 mg/m2   02/18/2016 Treatment Plan Change   Docetaxel dose reduced by 20%   03/06/2016 Procedure   Dr. Enrique Sack- Multiple extraction of tooth numbers 3, 5, 6, 8, 9, 10, 11, 12, 20, 21, 22, 27, 28, and 31. 4 Quadrants of alveoloplasty. Bilateral mandibular lingual tori reductions.   04/09/2016 Imaging   Brain MRI 1. No intracranial metastatic disease 2. No acute intracranial abnormality 3. Findings of chronic microvascular disease   05/14/2016 Imaging   CT C/A/P Stable appearing diffuse bony metastatic disease. No new  significant disease is seen Innumerable patchy sclerotic osseous metastases throughout the axial and proximal appendicular skeleton, stable in size and distribution, significantly increased in density in the interval, likely reflecting treatment effect. 2. No new or progressive metastatic disease in the chest, abdomen or pelvis. 3. Bilateral perifissural pulmonary nodules are stable and probably benign. 4. Aortic atherosclerosis. Infrarenal 4.3 cm abdominal aortic aneurysm, minimally increased in size. Recommend followup by ultrasound in 1 year.   06/02/2016 Tumor Marker   PSA 0.99 ng/ml    - 07/31/2016 Radiation Therapy   Palliative XRT by Dr. Lianne Cure in [place].    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  The Multi-Gene Panel offered by [facility] includes sequencing and/or deletion duplication testing of the following 85 genes: AIP, ALK, APC, ATM, AXIN2,BAP1,  BARD1, BLM, BMPR1A, BRCA1, BRCA2, BRIP1, CASR, CDC73, CDH1, CDK4, CDKN1B, CDKN1C, CDKN2A (p14ARF), CDKN2A (p16INK4a), CEBPA, CHEK2, CTNNA1, DICER1, DIS3L2, EGFR (c.2369C>T, p.Thr790Met variant only), EPCAM (Deletion/duplication testing only), FH, FLCN, GATA2, GPC3, GREM1 (Promoter region deletion/duplication testing only), HOXB13 (c.251G>A, p.Gly84Glu), HRAS, KIT, MAX, MEN1, MET, MITF (c.952G>A, p.Glu318Lys variant only), MLH1, MSH2, MSH3, MSH6, MUTYH, NBN, NF1, NF2, NTHL1, PALB2, PDGFRA, PHOX2B, PMS2, POLD1, POLE, POT1, PRKAR1A, PTCH1, PTEN, RAD50, RAD51C, RAD51D, RB1, RECQL4, RET, RNF43, RUNX1, SDHAF2, SDHA (sequence changes only), SDHB, SDHC, SDHD, SMAD4, SMARCA4, SMARCB1, SMARCE1, STK11, SUFU, TERC, TERT, TMEM127, TP53, TSC1, TSC2, VHL, WRN and WT1.  The report date is August 24, 2018  This  is not thought to increase the risk for colon cancer or other cancers, unless there is the presence of two  pathogenic mutations.   09/27/2018 - 06/27/2019 Chemotherapy   The patient had palonosetron (ALOXI) injection 0.25 mg, 0.25 mg, Intravenous,  Once, 10 of 10 cycles Administration: 0.25 mg (09/27/2018), 0.25 mg (10/25/2018), 0.25 mg (11/23/2018), 0.25 mg (12/21/2018), 0.25 mg (01/18/2019), 0.25 mg (02/15/2019), 0.25 mg (03/15/2019), 0.25 mg (04/12/2019), 0.25 mg (05/10/2019), 0.25 mg (06/07/2019) pegfilgrastim (NEULASTA ONPRO KIT) injection 6 mg, 6 mg, Subcutaneous, Once, 9 of 9 cycles Administration: 6 mg (10/25/2018), 6 mg (11/23/2018), 6 mg (12/21/2018), 6 mg (01/18/2019), 6 mg (02/15/2019), 6 mg (03/15/2019), 6 mg (04/12/2019), 6 mg (05/10/2019), 6 mg (06/07/2019) pegfilgrastim-cbqv (UDENYCA) injection 6 mg, 6 mg, Subcutaneous, Once, 1 of 1 cycle Administration: 6 mg (09/29/2018) cabazitaxel (JEVTANA) 35 mg in dextrose 5 % 250 mL chemo infusion, 15 mg/m2 = 35 mg (100 % of original dose 15 mg/m2), Intravenous,  Once, 10 of 10 cycles Dose modification: 15 mg/m2 (original dose 15 mg/m2, Cycle 1, Reason: Provider Judgment), 20 mg/m2 (original dose 15 mg/m2, Cycle 2, Reason: Provider Judgment), 20 mg/m2 (original dose 20 mg/m2, Cycle 5, Reason: Patient Age, Comment: Change in jevtana orderable), 15 mg/m2 (75 % of original dose 20 mg/m2, Cycle 8, Reason: Other (see comments), Comment: nausea, worsening numbness) Administration: 35 mg (09/27/2018), 46 mg (10/25/2018), 46 mg (11/23/2018), 46 mg (12/21/2018), 46 mg (01/18/2019), 46 mg (02/15/2019), 46 mg (03/15/2019), 35 mg (04/12/2019), 35 mg (05/10/2019), 35 mg (06/07/2019)  for chemotherapy treatment.    11/21/2019 -  Chemotherapy   The patient had palonosetron (ALOXI) injection 0.25 mg, 0.25 mg, Intravenous,  Once, 1 of 4 cycles Administration: 0.25 mg (11/21/2019) pegfilgrastim (NEULASTA ONPRO KIT) injection 6 mg, 6 mg, Subcutaneous, Once, 1 of 4 cycles DOCEtaxel (TAXOTERE) 140 mg in sodium chloride 0.9 % 250 mL chemo infusion, 60 mg/m2 = 140 mg (80 % of original dose 75 mg/m2),  Intravenous,  Once, 1 of 4 cycles Dose modification: 60 mg/m2 (80 % of original dose 75 mg/m2, Cycle 1, Reason: Other (see comments), Comment: based on previous tolerabiliity in 2017) Administration: 140 mg (11/21/2019)  for chemotherapy treatment.    Bone metastases ([place])  12/26/2015 Initial Diagnosis   Bone metastases ([place])   09/27/2018 - 06/27/2019 Chemotherapy   The patient had palonosetron (ALOXI) injection 0.25 mg, 0.25 mg, Intravenous,  Once, 4 of 6 cycles Administration: 0.25 mg (09/27/2018), 0.25 mg (10/25/2018), 0.25 mg (11/23/2018), 0.25 mg (12/21/2018) pegfilgrastim (NEULASTA ONPRO KIT) injection 6 mg, 6 mg, Subcutaneous, Once, 3 of 5 cycles Administration: 6 mg (10/25/2018), 6 mg (11/23/2018), 6 mg (12/21/2018) pegfilgrastim-cbqv (UDENYCA) injection 6 mg, 6 mg, Subcutaneous, Once, 1 of 1 cycle Administration: 6 mg (09/29/2018) cabazitaxel (JEVTANA) 35 mg in dextrose 5 % 250 mL chemo infusion, 15 mg/m2 = 35 mg (100 % of original dose 15 mg/m2), Intravenous,  Once, 4 of 6 cycles Dose modification: 15 mg/m2 (original dose 15 mg/m2, Cycle 1, Reason: Provider Judgment), 20 mg/m2 (original dose 15 mg/m2, Cycle 2, Reason: Provider Judgment) Administration: 35 mg (09/27/2018), 46 mg (10/25/2018), 46 mg (11/23/2018), 46 mg (12/21/2018)  for chemotherapy treatment.    11/21/2019 -  Chemotherapy   The patient had palonosetron (ALOXI) injection 0.25 mg, 0.25 mg, Intravenous,  Once, 1 of 4 cycles Administration: 0.25 mg (11/21/2019) pegfilgrastim (NEULASTA ONPRO KIT) injection 6 mg, 6 mg, Subcutaneous, Once, 1 of 4 cycles DOCEtaxel (TAXOTERE) 140 mg in sodium chloride 0.9 % 250 mL chemo infusion, 60  mg/m2 = 140 mg (80 % of original dose 75 mg/m2), Intravenous,  Once, 1 of 4 cycles Dose modification: 60 mg/m2 (80 % of original dose 75 mg/m2, Cycle 1, Reason: Other (see comments), Comment: based on previous tolerabiliity in 2017) Administration: 140 mg (11/21/2019)  for chemotherapy treatment.        CANCER STAGING: Cancer Staging Prostate carcinoma [facility]) Staging form: Prostate, AJCC 7th Edition - Clinical stage from 12/24/2015: Stage IV (TX, N0, M1b, PSA: 20 or greater) - Signed by Baird Cancer, PA-C on 01/07/2016    INTERVAL HISTORY:  Mr. Flagg 64 y.o. male seen for follow-up and toxicity assessment prior to cycle 1 of chemotherapy.  Appetite and energy levels are 25%.  Pain in the spine, neck, shoulders has been stable.  Has occasional constipation which is well managed.  Denies any major tingling or numbness in the extremities.  No fevers or chills reported.  REVIEW OF SYSTEMS:  Review of Systems  Cardiovascular: Positive for chest pain.  Gastrointestinal: Positive for constipation and nausea.  Neurological: Positive for headaches.  All other systems reviewed and are negative.    PAST MEDICAL/SURGICAL HISTORY:  Past Medical History:  Diagnosis Date  . Arthritis    Left knee  . Bone cancer ([place])   . Bone metastases ([place]) 12/26/2015  . Cancer [facility])    Prostate  . DVT (deep venous thrombosis) ([place]) 2004  . Family history of prostate cancer   . Family history of stomach cancer   . Kidney stones    ~2002  . Prostate carcinoma ([place]) 12/26/2015   Past Surgical History:  Procedure Laterality Date  . CIRCUMCISION  30 years ago  . MULTIPLE EXTRACTIONS WITH ALVEOLOPLASTY N/A 03/06/2016   Procedure: Extraction of tooth #'s 3,5,6,8-12,20-22, 27, 28, and 31 with alveoloplasty and bilateral mandibular tori reductions;  Surgeon: Lenn Cal, DDS;  Location: [facility];  Service: Oral Surgery;  Laterality: N/A;     SOCIAL HISTORY:  Social History   Socioeconomic History  . Marital status: Single    Spouse name: Not on file  . Number of children: 2  . Years of education: Not on file  . Highest education level: Not on file  Occupational History  . Occupation: Construction/carpenter  Tobacco Use  . Smoking status: Current Every Day Smoker    Packs/day: 1.00     Years: 50.00    Pack years: 50.00    Types: Cigarettes    Start date: 06/16/1965  . Smokeless tobacco: Former Systems developer    Types: [place] date: 08/04/2005  Substance and Sexual Activity  . Alcohol use: No    Alcohol/week: 0.0 standard drinks  . Drug use: Yes    Types: Marijuana    Comment: THC for appetite; occ  . Sexual activity: Not on file  Other Topics Concern  . Not on file  Social History Narrative  . Not on file   Social Determinants of Health   Financial Resource Strain:   . Difficulty of Paying Living Expenses:   Food Insecurity:   . Worried About Charity fundraiser in the Last Year:   . Arboriculturist in the Last Year:   Transportation Needs:   . Film/video editor (Medical):   Marland Kitchen Lack of Transportation (Non-Medical):   Physical Activity:   . Days of Exercise per Week:   . Minutes of Exercise per Session:   Stress:   . Feeling of Stress :   Social Connections:   .  Frequency of Communication with Friends and Family:   . Frequency of Social Gatherings with Friends and Family:   . Attends Religious Services:   . Active Member of Clubs or Organizations:   . Attends Archivist Meetings:   Marland Kitchen Marital Status:   Intimate Partner Violence:   . Fear of Current or Ex-Partner:   . Emotionally Abused:   Marland Kitchen Physically Abused:   . Sexually Abused:     FAMILY HISTORY:  Family History  Problem Relation Age of Onset  . Stomach cancer Mother        dx 40s  . Aneurysm Maternal Aunt        brain  . Alcohol abuse Maternal Uncle   . Stomach cancer Maternal Grandmother        dx 50s-60s  . Prostate cancer Maternal Uncle        dx 25s    CURRENT MEDICATIONS:  Outpatient Encounter Medications as of 11/21/2019  Medication Sig  . Calcium Carb-Cholecalciferol (CALCIUM 500 + D3) 500-600 MG-UNIT TABS Take 2 tablets by mouth daily.  Marland Kitchen docusate sodium (COLACE) 100 MG capsule Take 2 capsules daily to help soften the stool.  It this does not work, you can increase  to 2 capsules in the morning and 2 capsules in the evening.  If you do not have a bowel movement please call the [facility].  . ergocalciferol (VITAMIN D2) 50000 units capsule Take 1 capsule (50,000 Units total) by mouth every Friday.  . fentaNYL (DURAGESIC) 75 MCG/HR Place 1 patch onto the skin every 3 (three) days.  Marland Kitchen olaparib (LYNPARZA) 150 MG tablet Take 2 tablets (300 mg total) by mouth 2 (two) times daily. Swallow whole. May take with food to decrease nausea and vomiting.  Marland Kitchen omeprazole (PRILOSEC) 20 MG capsule Take 1 capsule (20 mg total) by mouth daily.  . Pseudoeph-Chlorphen-DM (CHILDRENS NYQUIL COLD/COUGH PO) Take 15 mLs by mouth at bedtime.  . silodosin (RAPAFLO) 8 MG CAPS capsule Take 1 capsule (8 mg total) by mouth daily with breakfast.  . tamsulosin (FLOMAX) 0.4 MG CAPS capsule Take 0.4 mg by mouth at bedtime.  . DOCEtaxel (TAXOTERE IV) Inject 75 mg/m2 into the vein every 21 ( twenty-one) days.   Marland Kitchen HYDROcodone-acetaminophen (NORCO) 10-325 MG tablet Take 1 tablet by mouth every 6 (six) hours as needed. (Patient not taking: Reported on 11/21/2019)  . lidocaine-prilocaine (EMLA) cream Apply a quarter size amount to port site 1 hour prior to chemo. Do not rub in. Cover with plastic wrap. (Patient not taking: Reported on 06/07/2019)  . NARCAN 4 MG/0.1ML LIQD nasal spray kit Place 1 spray into the nose as needed.  . ondansetron (ZOFRAN) 8 MG tablet TAKE 1 TABLET BY MOUTH EVERY 8 HOURS AS NEEDED FOR NAUSEA FOR VOMITING (Patient not taking: Reported on 11/21/2019)  . prochlorperazine (COMPAZINE) 10 MG tablet Take 1 tablet (10 mg total) by mouth every 6 (six) hours as needed for nausea or vomiting. (Patient not taking: Reported on 11/21/2019)  . traZODone (DESYREL) 100 MG tablet Take 1 tablet (100 mg total) by mouth at bedtime as needed for sleep. (Patient not taking: Reported on 11/21/2019)  . Wheat Dextrin (BENEFIBER PO) Take 1 Package by mouth as needed.    Facility-Administered  Encounter Medications as of 11/21/2019  Medication  . sodium chloride flush (NS) 0.9 % injection 10 mL    ALLERGIES:  Allergies  Allergen Reactions  . Meloxicam Other (See Comments)    Caused stomach bleeding.  PHYSICAL EXAM:  ECOG Performance status: 1  Vitals:   11/21/19 0854  BP: 101/81  Pulse: (!) 109  Resp: 18  Temp: (!) 97.5 F (36.4 C)  SpO2: 98%   Filed Weights   11/21/19 0854  Weight: 222 lb 6.4 oz (100.9 kg)    Physical Exam Vitals reviewed.  Constitutional:      Appearance: Normal appearance.  Cardiovascular:     Rate and Rhythm: Normal rate and regular rhythm.     Heart sounds: Normal heart sounds.  Pulmonary:     Effort: Pulmonary effort is normal.     Breath sounds: Normal breath sounds.  Abdominal:     General: There is no distension.     Palpations: Abdomen is soft. There is no mass.  Musculoskeletal:        General: No swelling.  Skin:    General: Skin is warm.  Neurological:     General: No focal deficit present.     Mental Status: He is alert and oriented to person, place, and time.  Psychiatric:        Mood and Affect: Mood normal.        Behavior: Behavior normal.      LABORATORY DATA:  I have reviewed the labs as listed.  CBC    Component Value Date/Time   WBC 5.6 11/21/2019 0806   RBC 3.59 (L) 11/21/2019 0806   HGB 12.2 (L) 11/21/2019 0806   HCT 36.8 (L) 11/21/2019 0806   PLT 217 11/21/2019 0806   MCV 102.5 (H) 11/21/2019 0806   MCH 34.0 11/21/2019 0806   MCHC 33.2 11/21/2019 0806   RDW 15.8 (H) 11/21/2019 0806   LYMPHSABS 1.0 11/21/2019 0806   MONOABS 0.4 11/21/2019 0806   EOSABS 0.2 11/21/2019 0806   BASOSABS 0.0 11/21/2019 0806   CMP Latest Ref Rng & Units 11/21/2019 11/03/2019 10/05/2019  Glucose 70 - 99 mg/dL 134(H) 121(H) 137(H)  BUN 8 - 23 mg/dL '12 14 13  '$ Creatinine 0.61 - 1.24 mg/dL 1.18 1.55(H) 1.34(H)  Sodium 135 - 145 mmol/L 140 138 137  Potassium 3.5 - 5.1 mmol/L 3.4(L) 3.7 3.8  Chloride 98 - 111 mmol/L  104 102 101  CO2 22 - 32 mmol/L '22 24 25  '$ Calcium 8.9 - 10.3 mg/dL 9.6 9.8 10.4(H)  Total Protein 6.5 - 8.1 g/dL 6.5 6.8 6.8  Total Bilirubin 0.3 - 1.2 mg/dL 0.6 0.4 0.4  Alkaline Phos 38 - 126 U/L 96 101 95  AST 15 - 41 U/L '17 16 16  '$ ALT 0 - 44 U/L '12 11 13       '$ DIAGNOSTIC IMAGING:  I have reviewed scans.    ASSESSMENT & PLAN:   Prostate carcinoma ([place]) 1.  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 -Abiraterone/prednisone, cabazitaxel with progression.  Guardant 360 showed ATM mutation. -Olaparib 300 mg twice daily from 07/28/2019 through 11/03/2019 with progression. -CT on 08/11/2019 shows bone mets with no visceral mets. -We reviewed results of the bone scan which showed worsening disease. -I have recommended changing therapy to docetaxel.  We talked about side effects in detail.  I will start him on lower dose of 60 mg per metered square as he tolerated that dose in 2017.  He will also receive Neulasta on pro. -We will see him back in 3 weeks for follow-up.  2.  Bone metastatic disease: -Denosumab on hold since 12/21/2018 secondary to ONJ.  It is held indefinitely.  3.  Back pain and left hip pain: -He is using fentanyl 75 mcg patch.  He is taking hydrocodone 10 mg 2 to 5 tablets/day.  4.  Mild hypercalcemia: -Calcium today improved to 9.6.  We held his calcium and vitamin D.  5.  Increased creatinine: -His creatinine improved to 1.18 from 1.55 previously.  This improved after discontinuation of olaparib.     Orders placed this encounter:  No orders of the defined types were placed in this encounter.     Derek Jack, MD [place] [phone]

## 2019-11-21 NOTE — Progress Notes (Signed)
Patient has been assessed, vital signs and labs have been reviewed by Dr. Delton Coombes. ANC, Creatinine, LFTs, and Platelets are within treatment parameters per Dr. Delton Coombes. The patient is good to proceed with treatment at this time. 20% dose reduction of treatment today per Dr. Delton Coombes.

## 2019-11-21 NOTE — Assessment & Plan Note (Signed)
1.  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 -Abiraterone/prednisone, cabazitaxel with progression.  Guardant 360 showed ATM mutation. -Olaparib 300 mg twice daily from 07/28/2019 through 11/03/2019 with progression. -CT on 08/11/2019 shows bone mets with no visceral mets. -We reviewed results of the bone scan which showed worsening disease. -I have recommended changing therapy to docetaxel.  We talked about side effects in detail.  I will start him on lower dose of 60 mg per metered square as he tolerated that dose in 2017.  He will also receive Neulasta on pro. -We will see him back in 3 weeks for follow-up.  2.  Bone metastatic disease: -Denosumab on hold since 12/21/2018 secondary to ONJ.  It is held indefinitely.  3.  Back pain and left hip pain: -He is using fentanyl 75 mcg patch.  He is taking hydrocodone 10 mg 2 to 5 tablets/day.  4.  Mild hypercalcemia: -Calcium today improved to 9.6.  We held his calcium and vitamin D.  5.  Increased creatinine: -His creatinine improved to 1.18 from 1.55 previously.  This improved after discontinuation of olaparib.

## 2019-11-21 NOTE — Progress Notes (Signed)
Patient presents today for D1C1 docetaxel infusion. Assessed by Dr. Delton Coombes, lab results and vitals reviewed and within parameters for treatment today. Patient education, verbal and written completed, education packet given to patient. Understanding verbalized by patient and consent obtained.   Matthew [name] tolerated docetaxel infusion without incident or complaint. VSS throughout. Port flushed and deaccessed, bandaid applied. On pro applied to right arm, green flashing light visualized. Discharged in satisfactory condition with follow up instructions.

## 2019-11-22 ENCOUNTER — Telehealth (HOSPITAL_COMMUNITY): Payer: Self-pay | Admitting: *Deleted

## 2019-11-22 NOTE — Telephone Encounter (Signed)
24 Post Chemotherapy Call  Called patient to check in 24 hour post starting new cycle of Docetaxel. Call went to voicemail. Patient informed to call the [facility] for any new symptoms,  concerns, or questions. Name and callback number given.   Matthew [name] 2:50 PM 11/22/2019

## 2019-12-05 ENCOUNTER — Other Ambulatory Visit (HOSPITAL_COMMUNITY): Payer: Self-pay | Admitting: Surgery

## 2019-12-05 DIAGNOSIS — C61 Malignant neoplasm of prostate: Secondary | ICD-10-CM

## 2019-12-05 DIAGNOSIS — C7951 Secondary malignant neoplasm of bone: Secondary | ICD-10-CM

## 2019-12-05 DIAGNOSIS — G893 Neoplasm related pain (acute) (chronic): Secondary | ICD-10-CM

## 2019-12-05 MED ORDER — HYDROCODONE-ACETAMINOPHEN 10-325 MG PO TABS: 1.0000 | ORAL_TABLET | Freq: Four times a day (QID) | ORAL | 0 refills | Status: DC | PRN

## 2019-12-09 ENCOUNTER — Other Ambulatory Visit (HOSPITAL_COMMUNITY): Payer: Self-pay | Admitting: Nurse Practitioner

## 2019-12-09 ENCOUNTER — Other Ambulatory Visit (HOSPITAL_COMMUNITY): Payer: Self-pay | Admitting: *Deleted

## 2019-12-09 ENCOUNTER — Encounter (HOSPITAL_COMMUNITY): Payer: Self-pay | Admitting: *Deleted

## 2019-12-09 DIAGNOSIS — C61 Malignant neoplasm of prostate: Secondary | ICD-10-CM

## 2019-12-09 DIAGNOSIS — C7951 Secondary malignant neoplasm of bone: Secondary | ICD-10-CM

## 2019-12-09 DIAGNOSIS — G893 Neoplasm related pain (acute) (chronic): Secondary | ICD-10-CM

## 2019-12-09 MED ORDER — HYDROCODONE-ACETAMINOPHEN 10-325 MG PO TABS: 1.0000 | ORAL_TABLET | Freq: Four times a day (QID) | ORAL | 0 refills | Status: DC | PRN

## 2019-12-09 NOTE — Progress Notes (Signed)
Patient called and his usual pharmacy is out of stock on his pain medications.  He has located enough supply at [facility].  I have contacted [facility] and cancelled the one prescription.

## 2019-12-12 ENCOUNTER — Other Ambulatory Visit (HOSPITAL_COMMUNITY): Payer: Medicare Other

## 2019-12-12 ENCOUNTER — Other Ambulatory Visit: Payer: Self-pay

## 2019-12-12 ENCOUNTER — Inpatient Hospital Stay (HOSPITAL_COMMUNITY): Payer: Medicare Other | Attending: Hematology | Admitting: Hematology

## 2019-12-12 ENCOUNTER — Inpatient Hospital Stay (HOSPITAL_COMMUNITY): Payer: Medicare Other

## 2019-12-12 ENCOUNTER — Other Ambulatory Visit (HOSPITAL_COMMUNITY): Payer: Self-pay | Admitting: *Deleted

## 2019-12-12 VITALS — BP 108/58 | HR 76 | Temp 96.9°F | Resp 18 | Wt 224.0 lb

## 2019-12-12 DIAGNOSIS — C61 Malignant neoplasm of prostate: Secondary | ICD-10-CM | POA: Diagnosis not present

## 2019-12-12 DIAGNOSIS — Z5189 Encounter for other specified aftercare: Secondary | ICD-10-CM | POA: Diagnosis not present

## 2019-12-12 DIAGNOSIS — Z5111 Encounter for antineoplastic chemotherapy: Secondary | ICD-10-CM | POA: Insufficient documentation

## 2019-12-12 DIAGNOSIS — C7951 Secondary malignant neoplasm of bone: Secondary | ICD-10-CM | POA: Insufficient documentation

## 2019-12-12 LAB — CBC WITH DIFFERENTIAL/PLATELET
Abs Immature Granulocytes: 0.02 10*3/uL (ref 0.00–0.07)
Basophils Absolute: 0.1 10*3/uL (ref 0.0–0.1)
Basophils Relative: 1 %
Eosinophils Absolute: 0 10*3/uL (ref 0.0–0.5)
Eosinophils Relative: 1 %
HCT: 32.4 % — ABNORMAL LOW (ref 39.0–52.0)
Hemoglobin: 10.6 g/dL — ABNORMAL LOW (ref 13.0–17.0)
Immature Granulocytes: 0 %
Lymphocytes Relative: 10 %
Lymphs Abs: 0.7 10*3/uL (ref 0.7–4.0)
MCH: 33.7 pg (ref 26.0–34.0)
MCHC: 32.7 g/dL (ref 30.0–36.0)
MCV: 102.9 fL — ABNORMAL HIGH (ref 80.0–100.0)
Monocytes Absolute: 0.6 10*3/uL (ref 0.1–1.0)
Monocytes Relative: 9 %
Neutro Abs: 5.4 10*3/uL (ref 1.7–7.7)
Neutrophils Relative %: 79 %
Platelets: 246 10*3/uL (ref 150–400)
RBC: 3.15 MIL/uL — ABNORMAL LOW (ref 4.22–5.81)
RDW: 14.8 % (ref 11.5–15.5)
WBC: 6.7 10*3/uL (ref 4.0–10.5)
nRBC: 0 % (ref 0.0–0.2)

## 2019-12-12 LAB — COMPREHENSIVE METABOLIC PANEL
ALT: 8 U/L (ref 0–44)
AST: 12 U/L — ABNORMAL LOW (ref 15–41)
Albumin: 3.3 g/dL — ABNORMAL LOW (ref 3.5–5.0)
Alkaline Phosphatase: 90 U/L (ref 38–126)
Anion gap: 11 (ref 5–15)
BUN: 13 mg/dL (ref 8–23)
CO2: 24 mmol/L (ref 22–32)
Calcium: 9 mg/dL (ref 8.9–10.3)
Chloride: 102 mmol/L (ref 98–111)
Creatinine, Ser: 1.06 mg/dL (ref 0.61–1.24)
GFR calc Af Amer: >60 mL/min (ref >60)
GFR calc non Af Amer: >60 mL/min (ref >60)
Glucose, Bld: 145 mg/dL — ABNORMAL HIGH (ref 70–99)
Potassium: 3.6 mmol/L (ref 3.5–5.1)
Sodium: 137 mmol/L (ref 135–145)
Total Bilirubin: 0.2 mg/dL — ABNORMAL LOW (ref 0.3–1.2)
Total Protein: 6.1 g/dL — ABNORMAL LOW (ref 6.5–8.1)

## 2019-12-12 LAB — PSA: Prostatic Specific Antigen: 125.63 ng/mL — ABNORMAL HIGH (ref 0.00–4.00)

## 2019-12-12 LAB — MAGNESIUM: Magnesium: 1.9 mg/dL (ref 1.7–2.4)

## 2019-12-12 MED ORDER — HEPARIN SOD (PORK) LOCK FLUSH 100 UNIT/ML IV SOLN
500.0000 [IU] | Freq: Once | INTRAVENOUS | Status: AC | PRN
  Administered 2019-12-12: 500 [IU]

## 2019-12-12 MED ORDER — PALONOSETRON HCL INJECTION 0.25 MG/5ML
0.2500 mg | Freq: Once | INTRAVENOUS | Status: AC
  Administered 2019-12-12: 0.25 mg via INTRAVENOUS

## 2019-12-12 MED ORDER — SODIUM CHLORIDE 0.9 % IV SOLN
10.0000 mg | Freq: Once | INTRAVENOUS | Status: AC
  Administered 2019-12-12: 10 mg via INTRAVENOUS
  Filled 2019-12-12: qty 10

## 2019-12-12 MED ORDER — PALONOSETRON HCL INJECTION 0.25 MG/5ML
INTRAVENOUS | Status: AC
  Filled 2019-12-12: qty 5

## 2019-12-12 MED ORDER — SODIUM CHLORIDE 0.9 % IV SOLN: Freq: Once | INTRAVENOUS | Status: AC

## 2019-12-12 MED ORDER — SODIUM CHLORIDE 0.9% FLUSH: 10.0000 mL | INTRAVENOUS | Status: DC | PRN

## 2019-12-12 MED ORDER — SODIUM CHLORIDE 0.9 % IV SOLN
60.0000 mg/m2 | Freq: Once | INTRAVENOUS | Status: AC
  Administered 2019-12-12: 140 mg via INTRAVENOUS
  Filled 2019-12-12: qty 14

## 2019-12-12 MED ORDER — PEGFILGRASTIM 6 MG/0.6ML ~~LOC~~ PSKT
6.0000 mg | PREFILLED_SYRINGE | Freq: Once | SUBCUTANEOUS | Status: AC
  Administered 2019-12-12: 6 mg via SUBCUTANEOUS
  Filled 2019-12-12: qty 0.6

## 2019-12-12 NOTE — Progress Notes (Signed)
Labs reviewed by Dr. Delton Coombes. Verbal order received to proceed with treatment. Labs within parameters for treatment. Vital signs within parameters for treatment.   Message received from [facility] LPN/ Dr. Delton Coombes to proceed with treatment. Labs reviewed. Infuse 1067ml of Normal Saline over 1 hour with treatment due to low BP and dizziness per MD.  Proceed with treatment.   Treatment given today per MD orders. Tolerated infusion without adverse affects. Vital signs stable. No complaints at this time. Discharged from clinic ambulatory. F/U with [facility] as scheduled.

## 2019-12-12 NOTE — Progress Notes (Signed)
Patient has been assessed, vital signs and labs have been reviewed by Dr. Delton Coombes. ANC, Creatinine, LFTs, and Platelets are within treatment parameters, please also give 1 liter NS per Dr. Delton Coombes. The patient is good to proceed with treatment at this time.

## 2019-12-12 NOTE — Progress Notes (Signed)

## 2019-12-12 NOTE — Progress Notes (Signed)
Matthew [name], [name] [address]   CLINIC:  Medical Oncology/Hematology  PCP:  [name], [place] [address] [phone]   REASON FOR VISIT:  Follow-up for prostate cancer   BRIEF ONCOLOGIC HISTORY:  Oncology History  Prostate carcinoma ([place])  12/10/2015 Imaging   CTA chest, multiple thoracic and rib osseous lesions. no primary evident. Atherosclerosis including aortic and CAD   12/12/2015 Imaging   CT abdomen/pelvis with sclerotic osseous lesions througout the lumbar spine, sacrum, bilateral iliac bones and L proximal femur worrisome for sclerotic osseous mets,mild prostatomegaly, no LAD, Infrarenal 3.8 cm AAA   12/12/2015 Tumor Marker   PSA 153.85   12/18/2015 Imaging   Bone Scan Diffuse bony metastatic disease, posterior calvarium, sternum, thoracic, lumbar spine, bilateral ribs, L shoulder, bilateral bony pelvis, proximal L femur   12/24/2015 Initial Biopsy   CT biopsy of L iliac bone lesion   12/26/2015 Pathology Results   Bone, biopsy, left iliac - POSITIVE FOR METASTATIC ADENOCARCINOMA.   12/28/2015 - 09/01/2016 Chemotherapy   Firmagon instituted 240 mg    01/01/2016 Procedure   Port placed by IR.   01/07/2016 - 04/11/2016 Chemotherapy   Docetaxel every 21 days x 6 cycles with dose reductions due to tolerance   02/18/2016 Adverse Reaction   GI toxicity from docetaxel, dose reduced to 60 mg/m2   02/18/2016 Treatment Plan Change   Docetaxel dose reduced by 20%   03/06/2016 Procedure   Dr. Enrique Sack- Multiple extraction of tooth numbers 3, 5, 6, 8, 9, 10, 11, 12, 20, 21, 22, 27, 28, and 31. 4 Quadrants of alveoloplasty. Bilateral mandibular lingual tori reductions.   04/09/2016 Imaging   Brain MRI 1. No intracranial metastatic disease 2. No acute intracranial abnormality 3. Findings of chronic microvascular disease   05/14/2016 Imaging   CT C/A/P Stable appearing diffuse bony metastatic disease. No new  significant disease is seen Innumerable patchy sclerotic osseous metastases throughout the axial and proximal appendicular skeleton, stable in size and distribution, significantly increased in density in the interval, likely reflecting treatment effect. 2. No new or progressive metastatic disease in the chest, abdomen or pelvis. 3. Bilateral perifissural pulmonary nodules are stable and probably benign. 4. Aortic atherosclerosis. Infrarenal 4.3 cm abdominal aortic aneurysm, minimally increased in size. Recommend followup by ultrasound in 1 year.   06/02/2016 Tumor Marker   PSA 0.99 ng/ml    - 07/31/2016 Radiation Therapy   Palliative XRT by Dr. Lianne Cure in [place].    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  The Multi-Gene Panel offered by [facility] includes sequencing and/or deletion duplication testing of the following 85 genes: AIP, ALK, APC, ATM, AXIN2,BAP1,  BARD1, BLM, BMPR1A, BRCA1, BRCA2, BRIP1, CASR, CDC73, CDH1, CDK4, CDKN1B, CDKN1C, CDKN2A (p14ARF), CDKN2A (p16INK4a), CEBPA, CHEK2, CTNNA1, DICER1, DIS3L2, EGFR (c.2369C>T, p.Thr790Met variant only), EPCAM (Deletion/duplication testing only), FH, FLCN, GATA2, GPC3, GREM1 (Promoter region deletion/duplication testing only), HOXB13 (c.251G>A, p.Gly84Glu), HRAS, KIT, MAX, MEN1, MET, MITF (c.952G>A, p.Glu318Lys variant only), MLH1, MSH2, MSH3, MSH6, MUTYH, NBN, NF1, NF2, NTHL1, PALB2, PDGFRA, PHOX2B, PMS2, POLD1, POLE, POT1, PRKAR1A, PTCH1, PTEN, RAD50, RAD51C, RAD51D, RB1, RECQL4, RET, RNF43, RUNX1, SDHAF2, SDHA (sequence changes only), SDHB, SDHC, SDHD, SMAD4, SMARCA4, SMARCB1, SMARCE1, STK11, SUFU, TERC, TERT, TMEM127, TP53, TSC1, TSC2, VHL, WRN and WT1.  The report date is August 24, 2018  This  is not thought to increase the risk for colon cancer or other cancers, unless there is the presence of two  pathogenic mutations.   09/27/2018 - 06/27/2019 Chemotherapy   The patient had palonosetron (ALOXI) injection 0.25 mg, 0.25 mg, Intravenous,  Once, 10 of 10 cycles Administration: 0.25 mg (09/27/2018), 0.25 mg (10/25/2018), 0.25 mg (11/23/2018), 0.25 mg (12/21/2018), 0.25 mg (01/18/2019), 0.25 mg (02/15/2019), 0.25 mg (03/15/2019), 0.25 mg (04/12/2019), 0.25 mg (05/10/2019), 0.25 mg (06/07/2019) pegfilgrastim (NEULASTA ONPRO KIT) injection 6 mg, 6 mg, Subcutaneous, Once, 9 of 9 cycles Administration: 6 mg (10/25/2018), 6 mg (11/23/2018), 6 mg (12/21/2018), 6 mg (01/18/2019), 6 mg (02/15/2019), 6 mg (03/15/2019), 6 mg (04/12/2019), 6 mg (05/10/2019), 6 mg (06/07/2019) pegfilgrastim-cbqv (UDENYCA) injection 6 mg, 6 mg, Subcutaneous, Once, 1 of 1 cycle Administration: 6 mg (09/29/2018) cabazitaxel (JEVTANA) 35 mg in dextrose 5 % 250 mL chemo infusion, 15 mg/m2 = 35 mg (100 % of original dose 15 mg/m2), Intravenous,  Once, 10 of 10 cycles Dose modification: 15 mg/m2 (original dose 15 mg/m2, Cycle 1, Reason: Provider Judgment), 20 mg/m2 (original dose 15 mg/m2, Cycle 2, Reason: Provider Judgment), 20 mg/m2 (original dose 20 mg/m2, Cycle 5, Reason: Patient Age, Comment: Change in jevtana orderable), 15 mg/m2 (75 % of original dose 20 mg/m2, Cycle 8, Reason: Other (see comments), Comment: nausea, worsening numbness) Administration: 35 mg (09/27/2018), 46 mg (10/25/2018), 46 mg (11/23/2018), 46 mg (12/21/2018), 46 mg (01/18/2019), 46 mg (02/15/2019), 46 mg (03/15/2019), 35 mg (04/12/2019), 35 mg (05/10/2019), 35 mg (06/07/2019)  for chemotherapy treatment.    11/21/2019 -  Chemotherapy   The patient had palonosetron (ALOXI) injection 0.25 mg, 0.25 mg, Intravenous,  Once, 2 of 4 cycles Administration: 0.25 mg (11/21/2019), 0.25 mg (12/12/2019) pegfilgrastim (NEULASTA ONPRO KIT) injection 6 mg, 6 mg, Subcutaneous, Once, 2 of 4 cycles Administration: 6 mg (11/21/2019), 6 mg (12/12/2019) DOCEtaxel (TAXOTERE) 140 mg in sodium chloride 0.9 % 250 mL  chemo infusion, 60 mg/m2 = 140 mg (80 % of original dose 75 mg/m2), Intravenous,  Once, 2 of 4 cycles Dose modification: 60 mg/m2 (80 % of original dose 75 mg/m2, Cycle 1, Reason: Other (see comments), Comment: based on previous tolerabiliity in 2017) Administration: 140 mg (11/21/2019), 140 mg (12/12/2019)  for chemotherapy treatment.    Bone metastases ([place])  12/26/2015 Initial Diagnosis   Bone metastases ([place])   09/27/2018 - 06/27/2019 Chemotherapy   The patient had palonosetron (ALOXI) injection 0.25 mg, 0.25 mg, Intravenous,  Once, 4 of 6 cycles Administration: 0.25 mg (09/27/2018), 0.25 mg (10/25/2018), 0.25 mg (11/23/2018), 0.25 mg (12/21/2018) pegfilgrastim (NEULASTA ONPRO KIT) injection 6 mg, 6 mg, Subcutaneous, Once, 3 of 5 cycles Administration: 6 mg (10/25/2018), 6 mg (11/23/2018), 6 mg (12/21/2018) pegfilgrastim-cbqv (UDENYCA) injection 6 mg, 6 mg, Subcutaneous, Once, 1 of 1 cycle Administration: 6 mg (09/29/2018) cabazitaxel (JEVTANA) 35 mg in dextrose 5 % 250 mL chemo infusion, 15 mg/m2 = 35 mg (100 % of original dose 15 mg/m2), Intravenous,  Once, 4 of 6 cycles Dose modification: 15 mg/m2 (original dose 15 mg/m2, Cycle 1, Reason: Provider Judgment), 20 mg/m2 (original dose 15 mg/m2, Cycle 2, Reason: Provider Judgment) Administration: 35 mg (09/27/2018), 46 mg (10/25/2018), 46 mg (11/23/2018), 46 mg (12/21/2018)  for chemotherapy treatment.    11/21/2019 -  Chemotherapy   The patient had palonosetron (ALOXI) injection 0.25 mg, 0.25 mg, Intravenous,  Once, 2 of 4 cycles Administration: 0.25 mg (11/21/2019), 0.25 mg (12/12/2019) pegfilgrastim (NEULASTA ONPRO KIT) injection 6 mg, 6 mg, Subcutaneous, Once, 2 of  4 cycles Administration: 6 mg (11/21/2019), 6 mg (12/12/2019) DOCEtaxel (TAXOTERE) 140 mg in sodium chloride 0.9 % 250 mL chemo infusion, 60 mg/m2 = 140 mg (80 % of original dose 75 mg/m2), Intravenous,  Once, 2 of 4 cycles Dose modification: 60 mg/m2 (80 % of original dose 75 mg/m2, Cycle 1,  Reason: Other (see comments), Comment: based on previous tolerabiliity in 2017) Administration: 140 mg (11/21/2019), 140 mg (12/12/2019)  for chemotherapy treatment.       CANCER STAGING: Cancer Staging Prostate carcinoma [facility]) Staging form: Prostate, AJCC 7th Edition - Clinical stage from 12/24/2015: Stage IV (TX, N0, M1b, PSA: 20 or greater) - Signed by Baird Cancer, PA-C on 01/07/2016    INTERVAL HISTORY:  Mr. Kabel 64 y.o. male seen for follow-up of prostate cancer and toxicity assessment prior to cycle 2 of chemotherapy.  Cycle 1 of docetaxel was 3 weeks ago.  Did not report any major side effects.  Constipation is stable.  Shortness of breath at times is stable.  Pain in the back and hip pain is rated as 3 out of 10.  Requiring up to 3 tablets of hydrocodone.  REVIEW OF SYSTEMS:  Review of Systems  Respiratory: Positive for shortness of breath.   Cardiovascular: Positive for chest pain.  Gastrointestinal: Positive for constipation.  Neurological: Positive for dizziness.  All other systems reviewed and are negative.    PAST MEDICAL/SURGICAL HISTORY:  Past Medical History:  Diagnosis Date  . Arthritis    Left knee  . Bone cancer ([place])   . Bone metastases ([place]) 12/26/2015  . Cancer [facility])    Prostate  . DVT (deep venous thrombosis) ([place]) 2004  . Family history of prostate cancer   . Family history of stomach cancer   . Kidney stones    ~2002  . Prostate carcinoma ([place]) 12/26/2015   Past Surgical History:  Procedure Laterality Date  . CIRCUMCISION  30 years ago  . MULTIPLE EXTRACTIONS WITH ALVEOLOPLASTY N/A 03/06/2016   Procedure: Extraction of tooth #'s 3,5,6,8-12,20-22, 27, 28, and 31 with alveoloplasty and bilateral mandibular tori reductions;  Surgeon: Lenn Cal, DDS;  Location: [place];  Service: Oral Surgery;  Laterality: N/A;     SOCIAL HISTORY:  Social History   Socioeconomic History  . Marital status: Single    Spouse name: Not on file  . Number of  children: 2  . Years of education: Not on file  . Highest education level: Not on file  Occupational History  . Occupation: Construction/carpenter  Tobacco Use  . Smoking status: Current Every Day Smoker    Packs/day: 1.00    Years: 50.00    Pack years: 50.00    Types: Cigarettes    Start date: 06/16/1965  . Smokeless tobacco: Former Systems developer    Types: [facility] date: 08/04/2005  Substance and Sexual Activity  . Alcohol use: No    Alcohol/week: 0.0 standard drinks  . Drug use: Yes    Types: Marijuana    Comment: THC for appetite; occ  . Sexual activity: Not on file  Other Topics Concern  . Not on file  Social History Narrative  . Not on file   Social Determinants of Health   Financial Resource Strain:   . Difficulty of Paying Living Expenses:   Food Insecurity:   . Worried About Charity fundraiser in the Last Year:   . Arboriculturist in the Last Year:   Transportation Needs:   .  Lack of Transportation (Medical):   Marland Kitchen Lack of Transportation (Non-Medical):   Physical Activity:   . Days of Exercise per Week:   . Minutes of Exercise per Session:   Stress:   . Feeling of Stress :   Social Connections:   . Frequency of Communication with Friends and Family:   . Frequency of Social Gatherings with Friends and Family:   . Attends Religious Services:   . Active Member of Clubs or Organizations:   . Attends Archivist Meetings:   Marland Kitchen Marital Status:   Intimate Partner Violence:   . Fear of Current or Ex-Partner:   . Emotionally Abused:   Marland Kitchen Physically Abused:   . Sexually Abused:     FAMILY HISTORY:  Family History  Problem Relation Age of Onset  . Stomach cancer Mother        dx 46s  . Aneurysm Maternal Aunt        brain  . Alcohol abuse Maternal Uncle   . Stomach cancer Maternal Grandmother        dx 50s-60s  . Prostate cancer Maternal Uncle        dx 36s    CURRENT MEDICATIONS:  Outpatient Encounter Medications as of 12/12/2019  Medication Sig  .  Calcium Carb-Cholecalciferol (CALCIUM 500 + D3) 500-600 MG-UNIT TABS Take 2 tablets by mouth daily.  . DOCEtaxel (TAXOTERE IV) Inject 75 mg/m2 into the vein every 21 ( twenty-one) days.   Marland Kitchen docusate sodium (COLACE) 100 MG capsule Take 2 capsules daily to help soften the stool.  It this does not work, you can increase to 2 capsules in the morning and 2 capsules in the evening.  If you do not have a bowel movement please call the [facility].  . ergocalciferol (VITAMIN D2) 50000 units capsule Take 1 capsule (50,000 Units total) by mouth every Friday.  . fentaNYL (DURAGESIC) 75 MCG/HR Place 1 patch onto the skin every 3 (three) days.  Marland Kitchen omeprazole (PRILOSEC) 20 MG capsule Take 1 capsule (20 mg total) by mouth daily.  . Pseudoeph-Chlorphen-DM (CHILDRENS NYQUIL COLD/COUGH PO) Take 15 mLs by mouth at bedtime.  . silodosin (RAPAFLO) 8 MG CAPS capsule Take 1 capsule (8 mg total) by mouth daily with breakfast.  . tamsulosin (FLOMAX) 0.4 MG CAPS capsule Take 0.4 mg by mouth at bedtime.  . [DISCONTINUED] olaparib (LYNPARZA) 150 MG tablet Take 2 tablets (300 mg total) by mouth 2 (two) times daily. Swallow whole. May take with food to decrease nausea and vomiting.  Marland Kitchen HYDROcodone-acetaminophen (NORCO) 10-325 MG tablet Take 1 tablet by mouth every 6 (six) hours as needed. (Patient not taking: Reported on 12/12/2019)  . lidocaine-prilocaine (EMLA) cream Apply a quarter size amount to port site 1 hour prior to chemo. Do not rub in. Cover with plastic wrap. (Patient not taking: Reported on 06/07/2019)  . NARCAN 4 MG/0.1ML LIQD nasal spray kit Place 1 spray into the nose as needed.  . ondansetron (ZOFRAN) 8 MG tablet TAKE 1 TABLET BY MOUTH EVERY 8 HOURS AS NEEDED FOR NAUSEA FOR VOMITING (Patient not taking: Reported on 11/21/2019)  . prochlorperazine (COMPAZINE) 10 MG tablet Take 1 tablet (10 mg total) by mouth every 6 (six) hours as needed for nausea or vomiting. (Patient not taking: Reported on 11/21/2019)  .  traZODone (DESYREL) 100 MG tablet Take 1 tablet (100 mg total) by mouth at bedtime as needed for sleep. (Patient not taking: Reported on 11/21/2019)  . Wheat Dextrin (BENEFIBER PO) Take  1 Package by mouth as needed.    Facility-Administered Encounter Medications as of 12/12/2019  Medication  . sodium chloride flush (NS) 0.9 % injection 10 mL    ALLERGIES:  Allergies  Allergen Reactions  . Meloxicam Other (See Comments)    Caused stomach bleeding.     PHYSICAL EXAM:  ECOG Performance status: 1  There were no vitals filed for this visit. There were no vitals filed for this visit.  Physical Exam Vitals reviewed.  Constitutional:      Appearance: Normal appearance.  Cardiovascular:     Rate and Rhythm: Normal rate and regular rhythm.     Heart sounds: Normal heart sounds.  Pulmonary:     Effort: Pulmonary effort is normal.     Breath sounds: Normal breath sounds.  Abdominal:     General: There is no distension.     Palpations: Abdomen is soft. There is no mass.  Musculoskeletal:        General: No swelling.  Skin:    General: Skin is warm.  Neurological:     General: No focal deficit present.     Mental Status: He is alert and oriented to person, place, and time.  Psychiatric:        Mood and Affect: Mood normal.        Behavior: Behavior normal.      LABORATORY DATA:  I have reviewed the labs as listed.  CBC    Component Value Date/Time   WBC 6.7 12/12/2019 0945   RBC 3.15 (L) 12/12/2019 0945   HGB 10.6 (L) 12/12/2019 0945   HCT 32.4 (L) 12/12/2019 0945   PLT 246 12/12/2019 0945   MCV 102.9 (H) 12/12/2019 0945   MCH 33.7 12/12/2019 0945   MCHC 32.7 12/12/2019 0945   RDW 14.8 12/12/2019 0945   LYMPHSABS 0.7 12/12/2019 0945   MONOABS 0.6 12/12/2019 0945   EOSABS 0.0 12/12/2019 0945   BASOSABS 0.1 12/12/2019 0945   CMP Latest Ref Rng & Units 12/12/2019 11/21/2019 11/03/2019  Glucose 70 - 99 mg/dL 145(H) 134(H) 121(H)  BUN 8 - 23 mg/dL '13 12 14  '$ Creatinine  0.61 - 1.24 mg/dL 1.06 1.18 1.55(H)  Sodium 135 - 145 mmol/L 137 140 138  Potassium 3.5 - 5.1 mmol/L 3.6 3.4(L) 3.7  Chloride 98 - 111 mmol/L 102 104 102  CO2 22 - 32 mmol/L '24 22 24  '$ Calcium 8.9 - 10.3 mg/dL 9.0 9.6 9.8  Total Protein 6.5 - 8.1 g/dL 6.1(L) 6.5 6.8  Total Bilirubin 0.3 - 1.2 mg/dL 0.2(L) 0.6 0.4  Alkaline Phos 38 - 126 U/L 90 96 101  AST 15 - 41 U/L 12(L) 17 16  ALT 0 - 44 U/L '8 12 11       '$ DIAGNOSTIC IMAGING:  I have reviewed scans.    ASSESSMENT & PLAN:   Prostate carcinoma ([place]) 1.  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 -Abiraterone/prednisone, cabazitaxel with progression.  Guardant 360 showed ATM mutation. -Olaparib 300 mg twice daily from 07/28/2019 through 11/03/2019 with progression. -CT scan on 08/11/2019 showed bone mets with no visceral mets. -Bone scan on 11/10/2019 showed worsening disease.  PSA was 132 on 11/21/2019. -Cycle 1 of docetaxel 60 mg/m2 on 11/21/2019.  I reviewed labs from today.  LFTs are normal.  White count is normal. -He will proceed with cycle 2 of docetaxel today at the same dose level. -I plan to see him back in 3  weeks.  We will plan to repeat scans after cycle 3.  2.  Bone metastatic disease: -Denosumab held indefinitely since 12/21/2018 secondary to ONJ.  3.  Back pain and left hip pain: -Continue fentanyl 75 mcg patch.  He is requiring hydrocodone 10 mg 3 tablets/day.  4.  Mild hypercalcemia: -Calcium today improved to 9.0.  5.  Elevated creatinine: -Creatinine improved to 1.06 today since the discontinuation of follow-up today.     Orders placed this encounter:  No orders of the defined types were placed in this encounter.     Derek Jack, MD [place] [phone]

## 2019-12-12 NOTE — Assessment & Plan Note (Addendum)
1.  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 -Abiraterone/prednisone, cabazitaxel with progression.  Guardant 360 showed ATM mutation. -Olaparib 300 mg twice daily from 07/28/2019 through 11/03/2019 with progression. -CT scan on 08/11/2019 showed bone mets with no visceral mets. -Bone scan on 11/10/2019 showed worsening disease.  PSA was 132 on 11/21/2019. -Cycle 1 of docetaxel 60 mg/m2 on 11/21/2019.  I reviewed labs from today.  LFTs are normal.  White count is normal. -He will proceed with cycle 2 of docetaxel today at the same dose level. -I plan to see him back in 3 weeks.  We will plan to repeat scans after cycle 3.  2.  Bone metastatic disease: -Denosumab held indefinitely since 12/21/2018 secondary to ONJ.  3.  Back pain and left hip pain: -Continue fentanyl 75 mcg patch.  He is requiring hydrocodone 10 mg 3 tablets/day.  4.  Mild hypercalcemia: -Calcium today improved to 9.0.  5.  Elevated creatinine: -Creatinine improved to 1.06 today since the discontinuation of follow-up today.

## 2019-12-12 NOTE — Patient Instructions (Signed)
[facility] Discharge Instructions for Patients Receiving Chemotherapy  Today you received the following chemotherapy agents   To help prevent nausea and vomiting after your treatment, we encourage you to take your nausea medication   If you develop nausea and vomiting that is not controlled by your nausea medication, call the clinic.   BELOW ARE SYMPTOMS THAT SHOULD BE REPORTED IMMEDIATELY:  *FEVER GREATER THAN 100.5 F  *CHILLS WITH OR WITHOUT FEVER  NAUSEA AND VOMITING THAT IS NOT CONTROLLED WITH YOUR NAUSEA MEDICATION  *UNUSUAL SHORTNESS OF BREATH  *UNUSUAL BRUISING OR BLEEDING  TENDERNESS IN MOUTH AND THROAT WITH OR WITHOUT PRESENCE OF ULCERS  *URINARY PROBLEMS  *BOWEL PROBLEMS  UNUSUAL RASH Items with * indicate a potential emergency and should be followed up as soon as possible.  Feel free to call the clinic should you have any questions or concerns. The clinic phone number is [phone].  Please show the CHEMO ALERT CARD at check-in to the Emergency Department and triage nurse.   

## 2019-12-12 NOTE — Patient Instructions (Signed)
[facility] at [facility] Discharge Instructions  You were seen today by Dr. Katragadda. He went over your recent lab results. He will see you back in 3 weeks for labs, treatment and follow up.   Thank you for choosing [facility] at [facility] to provide your oncology and hematology care.  To afford each patient quality time with our provider, please arrive at least 15 minutes before your scheduled appointment time.   If you have a lab appointment with the Cancer Center please come in thru the  Main Entrance and check in at the main information desk  You need to re-schedule your appointment should you arrive 10 or more minutes late.  We strive to give you quality time with our providers, and arriving late affects you and other patients whose appointments are after yours.  Also, if you no show three or more times for appointments you may be dismissed from the clinic at the providers discretion.     Again, thank you for choosing  Cancer Center.  Our hope is that these requests will decrease the amount of time that you wait before being seen by our physicians.       _____________________________________________________________  Should you have questions after your visit to  Cancer Center, please contact our office at [phone] between the hours of 8:00 a.m. and 4:30 p.m.  Voicemails left after 4:00 p.m. will not be returned until the following business day.  For prescription refill requests, have your pharmacy contact our office and allow 72 hours.    Cancer Center Support Programs:   > Cancer Support Group  2nd Tuesday of the month 1pm-2pm, Journey Room    

## 2019-12-20 ENCOUNTER — Other Ambulatory Visit (HOSPITAL_COMMUNITY): Payer: Self-pay | Admitting: Surgery

## 2019-12-20 DIAGNOSIS — G893 Neoplasm related pain (acute) (chronic): Secondary | ICD-10-CM

## 2019-12-20 DIAGNOSIS — C61 Malignant neoplasm of prostate: Secondary | ICD-10-CM

## 2019-12-20 MED ORDER — FENTANYL 75 MCG/HR TD PT72: 1.0000 | MEDICATED_PATCH | TRANSDERMAL | 0 refills | Status: DC

## 2020-01-03 ENCOUNTER — Other Ambulatory Visit: Payer: Self-pay

## 2020-01-03 ENCOUNTER — Inpatient Hospital Stay (HOSPITAL_COMMUNITY): Payer: Medicare Other | Attending: Hematology

## 2020-01-03 ENCOUNTER — Inpatient Hospital Stay (HOSPITAL_COMMUNITY): Payer: Medicare Other

## 2020-01-03 ENCOUNTER — Inpatient Hospital Stay (HOSPITAL_COMMUNITY): Payer: Medicare Other | Admitting: Hematology

## 2020-01-03 VITALS — BP 115/64 | HR 108 | Temp 98.1°F | Resp 18 | Wt 220.5 lb

## 2020-01-03 VITALS — BP 96/94 | HR 86 | Temp 97.9°F | Resp 18

## 2020-01-03 DIAGNOSIS — Z8042 Family history of malignant neoplasm of prostate: Secondary | ICD-10-CM | POA: Insufficient documentation

## 2020-01-03 DIAGNOSIS — Z5111 Encounter for antineoplastic chemotherapy: Secondary | ICD-10-CM | POA: Insufficient documentation

## 2020-01-03 DIAGNOSIS — Z5189 Encounter for other specified aftercare: Secondary | ICD-10-CM | POA: Diagnosis not present

## 2020-01-03 DIAGNOSIS — Z923 Personal history of irradiation: Secondary | ICD-10-CM | POA: Insufficient documentation

## 2020-01-03 DIAGNOSIS — C61 Malignant neoplasm of prostate: Secondary | ICD-10-CM

## 2020-01-03 DIAGNOSIS — Z800 Family history of malignant neoplasm of digestive organs: Secondary | ICD-10-CM | POA: Insufficient documentation

## 2020-01-03 DIAGNOSIS — C7951 Secondary malignant neoplasm of bone: Secondary | ICD-10-CM | POA: Diagnosis not present

## 2020-01-03 DIAGNOSIS — Z87442 Personal history of urinary calculi: Secondary | ICD-10-CM | POA: Insufficient documentation

## 2020-01-03 DIAGNOSIS — Z86718 Personal history of other venous thrombosis and embolism: Secondary | ICD-10-CM | POA: Insufficient documentation

## 2020-01-03 DIAGNOSIS — I2510 Atherosclerotic heart disease of native coronary artery without angina pectoris: Secondary | ICD-10-CM | POA: Insufficient documentation

## 2020-01-03 DIAGNOSIS — Z79899 Other long term (current) drug therapy: Secondary | ICD-10-CM | POA: Insufficient documentation

## 2020-01-03 DIAGNOSIS — F17210 Nicotine dependence, cigarettes, uncomplicated: Secondary | ICD-10-CM | POA: Insufficient documentation

## 2020-01-03 DIAGNOSIS — G893 Neoplasm related pain (acute) (chronic): Secondary | ICD-10-CM | POA: Insufficient documentation

## 2020-01-03 DIAGNOSIS — Z9221 Personal history of antineoplastic chemotherapy: Secondary | ICD-10-CM | POA: Insufficient documentation

## 2020-01-03 LAB — CBC WITH DIFFERENTIAL/PLATELET
Abs Immature Granulocytes: 0.03 10*3/uL (ref 0.00–0.07)
Basophils Absolute: 0.1 10*3/uL (ref 0.0–0.1)
Basophils Relative: 1 %
Eosinophils Absolute: 0.1 10*3/uL (ref 0.0–0.5)
Eosinophils Relative: 1 %
HCT: 35.6 % — ABNORMAL LOW (ref 39.0–52.0)
Hemoglobin: 11.7 g/dL — ABNORMAL LOW (ref 13.0–17.0)
Immature Granulocytes: 0 %
Lymphocytes Relative: 11 %
Lymphs Abs: 1 10*3/uL (ref 0.7–4.0)
MCH: 32.7 pg (ref 26.0–34.0)
MCHC: 32.9 g/dL (ref 30.0–36.0)
MCV: 99.4 fL (ref 80.0–100.0)
Monocytes Absolute: 0.7 10*3/uL (ref 0.1–1.0)
Monocytes Relative: 8 %
Neutro Abs: 6.9 10*3/uL (ref 1.7–7.7)
Neutrophils Relative %: 79 %
Platelets: 261 10*3/uL (ref 150–400)
RBC: 3.58 MIL/uL — ABNORMAL LOW (ref 4.22–5.81)
RDW: 14.2 % (ref 11.5–15.5)
WBC: 8.7 10*3/uL (ref 4.0–10.5)
nRBC: 0 % (ref 0.0–0.2)

## 2020-01-03 LAB — COMPREHENSIVE METABOLIC PANEL
ALT: 10 U/L (ref 0–44)
AST: 12 U/L — ABNORMAL LOW (ref 15–41)
Albumin: 3.6 g/dL (ref 3.5–5.0)
Alkaline Phosphatase: 102 U/L (ref 38–126)
Anion gap: 12 (ref 5–15)
BUN: 13 mg/dL (ref 8–23)
CO2: 25 mmol/L (ref 22–32)
Calcium: 9.2 mg/dL (ref 8.9–10.3)
Chloride: 99 mmol/L (ref 98–111)
Creatinine, Ser: 1.1 mg/dL (ref 0.61–1.24)
GFR calc Af Amer: >60 mL/min (ref >60)
GFR calc non Af Amer: >60 mL/min (ref >60)
Glucose, Bld: 126 mg/dL — ABNORMAL HIGH (ref 70–99)
Potassium: 3.9 mmol/L (ref 3.5–5.1)
Sodium: 136 mmol/L (ref 135–145)
Total Bilirubin: 0.4 mg/dL (ref 0.3–1.2)
Total Protein: 6.9 g/dL (ref 6.5–8.1)

## 2020-01-03 LAB — MAGNESIUM: Magnesium: 2 mg/dL (ref 1.7–2.4)

## 2020-01-03 LAB — PSA: Prostatic Specific Antigen: 150.35 ng/mL — ABNORMAL HIGH (ref 0.00–4.00)

## 2020-01-03 MED ORDER — PREDNISONE 5 MG PO TABS: 5.0000 mg | ORAL_TABLET | Freq: Every day | ORAL | 3 refills | Status: AC

## 2020-01-03 MED ORDER — SODIUM CHLORIDE 0.9% FLUSH
10.0000 mL | INTRAVENOUS | Status: DC | PRN
  Administered 2020-01-03: 10 mL

## 2020-01-03 MED ORDER — SODIUM CHLORIDE 0.9 % IV SOLN: Freq: Once | INTRAVENOUS | Status: AC

## 2020-01-03 MED ORDER — PEGFILGRASTIM 6 MG/0.6ML ~~LOC~~ PSKT
6.0000 mg | PREFILLED_SYRINGE | Freq: Once | SUBCUTANEOUS | Status: AC
  Administered 2020-01-03: 6 mg via SUBCUTANEOUS
  Filled 2020-01-03: qty 0.6

## 2020-01-03 MED ORDER — PALONOSETRON HCL INJECTION 0.25 MG/5ML
0.2500 mg | Freq: Once | INTRAVENOUS | Status: AC
  Administered 2020-01-03: 0.25 mg via INTRAVENOUS
  Filled 2020-01-03: qty 5

## 2020-01-03 MED ORDER — HEPARIN SOD (PORK) LOCK FLUSH 100 UNIT/ML IV SOLN
500.0000 [IU] | Freq: Once | INTRAVENOUS | Status: AC | PRN
  Administered 2020-01-03: 500 [IU]

## 2020-01-03 MED ORDER — SODIUM CHLORIDE 0.9 % IV SOLN
60.0000 mg/m2 | Freq: Once | INTRAVENOUS | Status: AC
  Administered 2020-01-03: 140 mg via INTRAVENOUS
  Filled 2020-01-03: qty 14

## 2020-01-03 MED ORDER — GABAPENTIN 300 MG PO CAPS
300.0000 mg | ORAL_CAPSULE | Freq: Every day | ORAL | 3 refills | Status: DC
Start: 2020-01-03 — End: 2020-05-09

## 2020-01-03 MED ORDER — SODIUM CHLORIDE 0.9 % IV SOLN
10.0000 mg | Freq: Once | INTRAVENOUS | Status: AC
  Administered 2020-01-03: 10 mg via INTRAVENOUS
  Filled 2020-01-03: qty 10

## 2020-01-03 NOTE — Patient Instructions (Signed)
[place] at [facility] Discharge Instructions  You were seen today by Dr. Delton Coombes. He went over your recent results. He is prescribing you gabapentin 300 mg for your back pain and neuropathy; this medication may make you drowsy, so we encourage you to take this at bedtime. He is also prescribing you prednisone. We are also scheduling you for repeat scans to monitor your cancer progression. Dr. Delton Coombes will see you back in 3 weeks for labs and follow up.   Thank you for choosing [place] at [facility] to provide your oncology and hematology care.  To afford each patient quality time with our provider, please arrive at least 15 minutes before your scheduled appointment time.   If you have a lab appointment with the [facility] please come in thru the  Main Entrance and check in at the main information desk  You need to re-schedule your appointment should you arrive 10 or more minutes late.  We strive to give you quality time with our providers, and arriving late affects you and other patients whose appointments are after yours.  Also, if you no show three or more times for appointments you may be dismissed from the clinic at the providers discretion.     Again, thank you for choosing [facility].  Our hope is that these requests will decrease the amount of time that you wait before being seen by our physicians.       _____________________________________________________________  Should you have questions after your visit to [facility], please contact our office at [phone] between the hours of 8:00 a.m. and 4:30 p.m.  Voicemails left after 4:00 p.m. will not be returned until the following business day.  For prescription refill requests, have your pharmacy contact our office and allow 72 hours.    Cancer Center Support Programs:   > Cancer Support Group  2nd Tuesday of the month 1pm-2pm, Journey Room

## 2020-01-03 NOTE — Progress Notes (Signed)
[place], [place] [address]   CLINIC:  Medical Oncology/Hematology  PCP:  [name], [place] / [place] [address] [phone]   REASON FOR VISIT:  Follow-up for prostate cancer  CURRENT THERAPY:  docetaxel  BRIEF ONCOLOGIC HISTORY:  Oncology History  Prostate carcinoma ([place])  12/10/2015 Imaging   CTA chest, multiple thoracic and rib osseous lesions. no primary evident. Atherosclerosis including aortic and CAD   12/12/2015 Imaging   CT abdomen/pelvis with sclerotic osseous lesions througout the lumbar spine, sacrum, bilateral iliac bones and L proximal femur worrisome for sclerotic osseous mets,mild prostatomegaly, no LAD, Infrarenal 3.8 cm AAA   12/12/2015 Tumor Marker   PSA 153.85   12/18/2015 Imaging   Bone Scan Diffuse bony metastatic disease, posterior calvarium, sternum, thoracic, lumbar spine, bilateral ribs, L shoulder, bilateral bony pelvis, proximal L femur   12/24/2015 Initial Biopsy   CT biopsy of L iliac bone lesion   12/26/2015 Pathology Results   Bone, biopsy, left iliac - POSITIVE FOR METASTATIC ADENOCARCINOMA.   12/28/2015 - 09/01/2016 Chemotherapy   Firmagon instituted 240 mg    01/01/2016 Procedure   Port placed by IR.   01/07/2016 - 04/11/2016 Chemotherapy   Docetaxel every 21 days x 6 cycles with dose reductions due to tolerance   02/18/2016 Adverse Reaction   GI toxicity from docetaxel, dose reduced to 60 mg/m2   02/18/2016 Treatment Plan Change   Docetaxel dose reduced by 20%   03/06/2016 Procedure   Dr. Enrique Sack- Multiple extraction of tooth numbers 3, 5, 6, 8, 9, 10, 11, 12, 20, 21, 22, 27, 28, and 31. 4 Quadrants of alveoloplasty. Bilateral mandibular lingual tori reductions.   04/09/2016 Imaging   Brain MRI 1. No intracranial metastatic disease 2. No acute intracranial abnormality 3. Findings of chronic microvascular disease   05/14/2016 Imaging   CT C/A/P Stable appearing diffuse bony  metastatic disease. No new significant disease is seen Innumerable patchy sclerotic osseous metastases throughout the axial and proximal appendicular skeleton, stable in size and distribution, significantly increased in density in the interval, likely reflecting treatment effect. 2. No new or progressive metastatic disease in the chest, abdomen or pelvis. 3. Bilateral perifissural pulmonary nodules are stable and probably benign. 4. Aortic atherosclerosis. Infrarenal 4.3 cm abdominal aortic aneurysm, minimally increased in size. Recommend followup by ultrasound in 1 year.   06/02/2016 Tumor Marker   PSA 0.99 ng/ml    - 07/31/2016 Radiation Therapy   Palliative XRT by Dr. Lianne Cure in [place].    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  The Multi-Gene Panel offered by [facility] includes sequencing and/or deletion duplication testing of the following 85 genes: AIP, ALK, APC, ATM, AXIN2,BAP1,  BARD1, BLM, BMPR1A, BRCA1, BRCA2, BRIP1, CASR, CDC73, CDH1, CDK4, CDKN1B, CDKN1C, CDKN2A (p14ARF), CDKN2A (p16INK4a), CEBPA, CHEK2, CTNNA1, DICER1, DIS3L2, EGFR (c.2369C>T, p.Thr790Met variant only), EPCAM (Deletion/duplication testing only), FH, FLCN, GATA2, GPC3, GREM1 (Promoter region deletion/duplication testing only), HOXB13 (c.251G>A, p.Gly84Glu), HRAS, KIT, MAX, MEN1, MET, MITF (c.952G>A, p.Glu318Lys variant only), MLH1, MSH2, MSH3, MSH6, MUTYH, NBN, NF1, NF2, NTHL1, PALB2, PDGFRA, PHOX2B, PMS2, POLD1, POLE, POT1, PRKAR1A, PTCH1, PTEN, RAD50, RAD51C, RAD51D, RB1, RECQL4, RET, RNF43, RUNX1, SDHAF2, SDHA (sequence changes only), SDHB, SDHC, SDHD, SMAD4, SMARCA4, SMARCB1, SMARCE1, STK11, SUFU, TERC, TERT, TMEM127, TP53, TSC1, TSC2, VHL, WRN and WT1.  The report date is  August 24, 2018  This is not thought to increase the risk for colon cancer or other cancers, unless there is  the presence of two pathogenic mutations.   09/27/2018 - 06/27/2019 Chemotherapy   The patient had palonosetron (ALOXI) injection 0.25 mg, 0.25 mg, Intravenous,  Once, 10 of 10 cycles Administration: 0.25 mg (09/27/2018), 0.25 mg (10/25/2018), 0.25 mg (11/23/2018), 0.25 mg (12/21/2018), 0.25 mg (01/18/2019), 0.25 mg (02/15/2019), 0.25 mg (03/15/2019), 0.25 mg (04/12/2019), 0.25 mg (05/10/2019), 0.25 mg (06/07/2019) pegfilgrastim (NEULASTA ONPRO KIT) injection 6 mg, 6 mg, Subcutaneous, Once, 9 of 9 cycles Administration: 6 mg (10/25/2018), 6 mg (11/23/2018), 6 mg (12/21/2018), 6 mg (01/18/2019), 6 mg (02/15/2019), 6 mg (03/15/2019), 6 mg (04/12/2019), 6 mg (05/10/2019), 6 mg (06/07/2019) pegfilgrastim-cbqv (UDENYCA) injection 6 mg, 6 mg, Subcutaneous, Once, 1 of 1 cycle Administration: 6 mg (09/29/2018) cabazitaxel (JEVTANA) 35 mg in dextrose 5 % 250 mL chemo infusion, 15 mg/m2 = 35 mg (100 % of original dose 15 mg/m2), Intravenous,  Once, 10 of 10 cycles Dose modification: 15 mg/m2 (original dose 15 mg/m2, Cycle 1, Reason: Provider Judgment), 20 mg/m2 (original dose 15 mg/m2, Cycle 2, Reason: Provider Judgment), 20 mg/m2 (original dose 20 mg/m2, Cycle 5, Reason: Patient Age, Comment: Change in jevtana orderable), 15 mg/m2 (75 % of original dose 20 mg/m2, Cycle 8, Reason: Other (see comments), Comment: nausea, worsening numbness) Administration: 35 mg (09/27/2018), 46 mg (10/25/2018), 46 mg (11/23/2018), 46 mg (12/21/2018), 46 mg (01/18/2019), 46 mg (02/15/2019), 46 mg (03/15/2019), 35 mg (04/12/2019), 35 mg (05/10/2019), 35 mg (06/07/2019)  for chemotherapy treatment.    11/21/2019 -  Chemotherapy   The patient had palonosetron (ALOXI) injection 0.25 mg, 0.25 mg, Intravenous,  Once, 2 of 4 cycles Administration: 0.25 mg (11/21/2019), 0.25 mg (12/12/2019) pegfilgrastim (NEULASTA ONPRO KIT) injection 6 mg, 6 mg, Subcutaneous, Once, 2 of 4 cycles Administration: 6 mg (11/21/2019), 6 mg (12/12/2019) DOCEtaxel (TAXOTERE) 140 mg in sodium  chloride 0.9 % 250 mL chemo infusion, 60 mg/m2 = 140 mg (80 % of original dose 75 mg/m2), Intravenous,  Once, 2 of 4 cycles Dose modification: 60 mg/m2 (80 % of original dose 75 mg/m2, Cycle 1, Reason: Other (see comments), Comment: based on previous tolerabiliity in 2017) Administration: 140 mg (11/21/2019), 140 mg (12/12/2019)  for chemotherapy treatment.    Bone metastases ([place])  12/26/2015 Initial Diagnosis   Bone metastases ([place])   09/27/2018 - 06/27/2019 Chemotherapy   The patient had palonosetron (ALOXI) injection 0.25 mg, 0.25 mg, Intravenous,  Once, 4 of 6 cycles Administration: 0.25 mg (09/27/2018), 0.25 mg (10/25/2018), 0.25 mg (11/23/2018), 0.25 mg (12/21/2018) pegfilgrastim (NEULASTA ONPRO KIT) injection 6 mg, 6 mg, Subcutaneous, Once, 3 of 5 cycles Administration: 6 mg (10/25/2018), 6 mg (11/23/2018), 6 mg (12/21/2018) pegfilgrastim-cbqv (UDENYCA) injection 6 mg, 6 mg, Subcutaneous, Once, 1 of 1 cycle Administration: 6 mg (09/29/2018) cabazitaxel (JEVTANA) 35 mg in dextrose 5 % 250 mL chemo infusion, 15 mg/m2 = 35 mg (100 % of original dose 15 mg/m2), Intravenous,  Once, 4 of 6 cycles Dose modification: 15 mg/m2 (original dose 15 mg/m2, Cycle 1, Reason: Provider Judgment), 20 mg/m2 (original dose 15 mg/m2, Cycle 2, Reason: Provider Judgment) Administration: 35 mg (09/27/2018), 46 mg (10/25/2018), 46 mg (11/23/2018), 46 mg (12/21/2018)  for chemotherapy treatment.    11/21/2019 -  Chemotherapy   The patient had palonosetron (ALOXI) injection 0.25 mg, 0.25 mg, Intravenous,  Once, 2 of 4 cycles Administration: 0.25 mg (11/21/2019), 0.25 mg (12/12/2019) pegfilgrastim (NEULASTA ONPRO KIT) injection 6 mg, 6  mg, Subcutaneous, Once, 2 of 4 cycles Administration: 6 mg (11/21/2019), 6 mg (12/12/2019) DOCEtaxel (TAXOTERE) 140 mg in sodium chloride 0.9 % 250 mL chemo infusion, 60 mg/m2 = 140 mg (80 % of original dose 75 mg/m2), Intravenous,  Once, 2 of 4 cycles Dose modification: 60 mg/m2 (80 % of original  dose 75 mg/m2, Cycle 1, Reason: Other (see comments), Comment: based on previous tolerabiliity in 2017) Administration: 140 mg (11/21/2019), 140 mg (12/12/2019)  for chemotherapy treatment.      CANCER STAGING: Cancer Staging Prostate carcinoma [facility]) Staging form: Prostate, AJCC 7th Edition - Clinical stage from 12/24/2015: Stage IV (TX, N0, M1b, PSA: 20 or greater) - Signed by Baird Cancer, PA-C on 01/07/2016   INTERVAL HISTORY:  [name], a 64 y.o. male, returns for routine follow-up and consideration for next cycle of chemotherapy. [place] was last seen on 12/12/2019.  Due for cycle #3 of docetaxel today.   Overall, he tells me he has been feeling pretty well. He notes some chest pain, which is relatively unchanged from his normal. He is in significant pain sitting in the chair today, mostly in his spine. His appetite is severely decreased and he is only eating one meal per day. He states that he is drinking 16 ounces of boost per day.   Overall, he feels ready for next cycle of chemo today.   REVIEW OF SYSTEMS:  Review of Systems  Constitutional: Positive for appetite change (severely decreased) and fatigue (moderate).  Cardiovascular: Positive for chest pain.  Gastrointestinal: Positive for constipation and nausea.  Genitourinary: Positive for difficulty urinating (Difficulty starting) and hematuria.   Musculoskeletal: Positive for back pain.  Neurological: Positive for numbness.  All other systems reviewed and are negative.   PAST MEDICAL/SURGICAL HISTORY:  Past Medical History:  Diagnosis Date   Arthritis    Left knee   Bone cancer ([place])    Bone metastases ([place]) 12/26/2015   Cancer ([place])    Prostate   DVT (deep venous thrombosis) ([place]) 2004   Family history of prostate cancer    Family history of stomach cancer    Kidney stones    ~2002   Prostate carcinoma ([place]) 12/26/2015   Past Surgical History:  Procedure Laterality Date   CIRCUMCISION  30  years ago   MULTIPLE EXTRACTIONS WITH ALVEOLOPLASTY N/A 03/06/2016   Procedure: Extraction of tooth #'s 3,5,6,8-12,20-22, 27, 28, and 31 with alveoloplasty and bilateral mandibular tori reductions;  Surgeon: Lenn Cal, DDS;  Location: [place] [facility];  Service: Oral Surgery;  Laterality: N/A;    SOCIAL HISTORY:  Social History   Socioeconomic History   Marital status: Single    Spouse name: Not on file   Number of children: 2   Years of education: Not on file   Highest education level: Not on file  Occupational History   Occupation: Construction/carpenter  Tobacco Use   Smoking status: Current Every Day Smoker    Packs/day: 1.00    Years: 50.00    Pack years: 50.00    Types: Cigarettes    Start date: 06/16/1965   Smokeless tobacco: Former Systems developer    Types: Chew    Quit date: 08/04/2005  Substance and Sexual Activity   Alcohol use: No    Alcohol/week: 0.0 standard drinks   Drug use: Yes    Types: Marijuana    Comment: THC for appetite; occ   Sexual activity: Not on file  Other Topics Concern   Not on file  Social History Narrative   Not on file   Social Determinants of Health   Financial Resource Strain:    Difficulty of Paying Living Expenses:   Food Insecurity:    Worried About Charity fundraiser in the Last Year:    Arboriculturist in the Last Year:   Transportation Needs:    Film/video editor (Medical):    Lack of Transportation (Non-Medical):   Physical Activity:    Days of Exercise per Week:    Minutes of Exercise per Session:   Stress:    Feeling of Stress :   Social Connections:    Frequency of Communication with Friends and Family:    Frequency of Social Gatherings with Friends and Family:    Attends Religious Services:    Active Member of Clubs or Organizations:    Attends Music therapist:    Marital Status:   Intimate Partner Violence:    Fear of Current or Ex-Partner:    Emotionally Abused:     Physically Abused:    Sexually Abused:     FAMILY HISTORY:  Family History  Problem Relation Age of Onset   Stomach cancer Mother        dx 47s   Aneurysm Maternal Aunt        brain   Alcohol abuse Maternal Uncle    Stomach cancer Maternal Grandmother        dx 50s-60s   Prostate cancer Maternal Uncle        dx 17s    CURRENT MEDICATIONS:  Current Outpatient Medications  Medication Sig Dispense Refill   DOCEtaxel (TAXOTERE IV) Inject 75 mg/m2 into the vein every 21 ( twenty-one) days.      docusate sodium (COLACE) 100 MG capsule Take 2 capsules daily to help soften the stool.  It this does not work, you can increase to 2 capsules in the morning and 2 capsules in the evening.  If you do not have a bowel movement please call the [facility]. 30 capsule 1   fentaNYL (DURAGESIC) 75 MCG/HR Place 1 patch onto the skin every 3 (three) days. 10 patch 0   Pseudoeph-Chlorphen-DM (CHILDRENS NYQUIL COLD/COUGH PO) Take 15 mLs by mouth at bedtime.     silodosin (RAPAFLO) 8 MG CAPS capsule Take 1 capsule (8 mg total) by mouth daily with breakfast. 30 capsule 11   tamsulosin (FLOMAX) 0.4 MG CAPS capsule Take 0.4 mg by mouth at bedtime.     HYDROcodone-acetaminophen (NORCO) 10-325 MG tablet Take 1 tablet by mouth every 6 (six) hours as needed. (Patient not taking: Reported on 01/03/2020) 112 tablet 0   lidocaine-prilocaine (EMLA) cream Apply a quarter size amount to port site 1 hour prior to chemo. Do not rub in. Cover with plastic wrap. (Patient not taking: Reported on 01/03/2020) 30 g 3   NARCAN 4 MG/0.1ML LIQD nasal spray kit Place 1 spray into the nose as needed.     ondansetron (ZOFRAN) 8 MG tablet TAKE 1 TABLET BY MOUTH EVERY 8 HOURS AS NEEDED FOR NAUSEA FOR VOMITING (Patient not taking: Reported on 01/03/2020) 30 tablet 0   prochlorperazine (COMPAZINE) 10 MG tablet Take 1 tablet (10 mg total) by mouth every 6 (six) hours as needed for nausea or vomiting. (Patient not  taking: Reported on 01/03/2020) 30 tablet 1   traZODone (DESYREL) 100 MG tablet Take 1 tablet (100 mg total) by mouth at bedtime as needed for sleep. (Patient not taking: Reported on  01/03/2020) 30 tablet 3   No current facility-administered medications for this visit.   Facility-Administered Medications Ordered in Other Visits  Medication Dose Route Frequency Provider Last Rate Last Admin   sodium chloride flush (NS) 0.9 % injection 10 mL  10 mL Intracatheter PRN Derek Jack, MD   10 mL at 01/18/19 0815    ALLERGIES:  Allergies  Allergen Reactions   Meloxicam Other (See Comments)    Caused stomach bleeding.    PHYSICAL EXAM:  Performance status (ECOG): 1 - Symptomatic but completely ambulatory  Vitals:   01/03/20 1034  BP: 115/64  Pulse: (!) 108  Resp: 18  Temp: 98.1 F (36.7 C)  SpO2: 99%   Wt Readings from Last 3 Encounters:  01/03/20 220 lb 8 oz (100 kg)  12/12/19 224 lb (101.6 kg)  11/21/19 222 lb 6.4 oz (100.9 kg)   Physical Exam Vitals reviewed.  Constitutional:      Appearance: Normal appearance.  Cardiovascular:     Rate and Rhythm: Normal rate and regular rhythm.     Heart sounds: Normal heart sounds.  Pulmonary:     Effort: Pulmonary effort is normal.     Breath sounds: Normal breath sounds.  Skin:    General: Skin is warm.  Neurological:     General: No focal deficit present.     Mental Status: He is alert and oriented to person, place, and time.  Psychiatric:        Mood and Affect: Mood normal.        Behavior: Behavior normal.     LABORATORY DATA:  I have reviewed the labs as listed.  CBC Latest Ref Rng & Units 01/03/2020 12/12/2019 11/21/2019  WBC 4.0 - 10.5 K/uL 8.7 6.7 5.6  Hemoglobin 13.0 - 17.0 g/dL 11.7(L) 10.6(L) 12.2(L)  Hematocrit 39.0 - 52.0 % 35.6(L) 32.4(L) 36.8(L)  Platelets 150 - 400 K/uL 261 246 217   CMP Latest Ref Rng & Units 01/03/2020 12/12/2019 11/21/2019  Glucose 70 - 99 mg/dL 126(H) 145(H) 134(H)  BUN 8 - 23 mg/dL  _0 Creatinine 0.61 - 1.24 mg/dL 1.10 1.06 1.18  Sodium 135 - 145 mmol/L 136 137 140  Potassium 3.5 - 5.1 mmol/L 3.9 3.6 3.4(L)  Chloride 98 - 111 mmol/L 99 102 104  CO2 22 - 32 mmol/L _1 Calcium 8.9 - 10.3 mg/dL 9.2 9.0 9.6  Total Protein 6.5 - 8.1 g/dL 6.9 6.1(L) 6.5  Total Bilirubin 0.3 - 1.2 mg/dL 0.4 0.2(L) 0.6  Alkaline Phos 38 - 126 U/L 102 90 96  AST 15 - 41 U/L 12(L) 12(L) 17  ALT 0 - 44 U/L _2 DIAGNOSTIC IMAGING:  I have independently reviewed the scans and discussed with the patient.   ASSESSMENT:  1.  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 -Abiraterone/prednisone, cabazitaxel with progression. -Guardant 360 showed ATM mutation. -Olaparib 300 mg twice daily from 07/28/2019 through 11/03/2019 with progression. -Bone scan on 11/10/2019 showed worsening disease.  PSA was 132 on 11/21/2019. -CT scan on 08/11/2019 showed bone metastasis with no visceral metastasis. -Docetaxel 60 mg per metered square started on 11/21/2019.   PLAN:  1.  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  CBC and LFTs are adequate to proceed with cycle 3 today. -We will maintain 60 mg per metered squared dose.  I plan to repeat bone scan  prior to next visit in 3 weeks.  His PSA on 12/12/2019 was 125.  We will also follow-up today's PSA.  2. Bone Metastatic Disease: -Denosumab held indefinitely since 12/21/2018 secondary to ONJ.  3. Back Pain: -Continue fentanyl 75 mcg patch. -Continue hydrocodone 10 mg 3 to 4 tablets/day.  4. Mild Hypercalcemia: -Calcium today is 9.2 with albumin of 3.6.  5. Elevated Creatinine: -Creatinine improved to 1.1 today.    Orders placed this encounter:  No orders of the defined types were placed in this encounter.    Derek Jack, MD [facility] [phone]   I, Jacqualyn Posey, am  acting as a scribe for Dr. Sanda Linger.  I, Derek Jack MD, have reviewed the above documentation for accuracy and completeness, and I agree with the above.

## 2020-01-03 NOTE — Progress Notes (Signed)
Patient has been assessed, vital signs and labs have been reviewed by Dr. Katragadda. ANC, Creatinine, LFTs, and Platelets are within treatment parameters per Dr. Katragadda. The patient is good to proceed with treatment at this time.  

## 2020-01-03 NOTE — Progress Notes (Signed)
Patient tolerated chemotherapy with no complaints voiced.  Neulasta Onpro applied to left arm with green indicator light flashing.  Side effects with management reviewed with understanding verbalized.  Port site clean and dry with no bruising or swelling noted at site.  Good blood return noted before and after administration of chemotherapy.  Band aid applied.  Patient left ambulatory with VSS and no s/s of distress noted.

## 2020-01-10 ENCOUNTER — Other Ambulatory Visit (HOSPITAL_COMMUNITY): Payer: Self-pay | Admitting: *Deleted

## 2020-01-10 DIAGNOSIS — G893 Neoplasm related pain (acute) (chronic): Secondary | ICD-10-CM

## 2020-01-10 DIAGNOSIS — C7951 Secondary malignant neoplasm of bone: Secondary | ICD-10-CM

## 2020-01-10 DIAGNOSIS — C61 Malignant neoplasm of prostate: Secondary | ICD-10-CM

## 2020-01-10 MED ORDER — FENTANYL 75 MCG/HR TD PT72: 1.0000 | MEDICATED_PATCH | TRANSDERMAL | 0 refills | Status: DC

## 2020-01-10 MED ORDER — HYDROCODONE-ACETAMINOPHEN 10-325 MG PO TABS: 1.0000 | ORAL_TABLET | Freq: Four times a day (QID) | ORAL | 0 refills | Status: DC | PRN

## 2020-01-15 ENCOUNTER — Other Ambulatory Visit (HOSPITAL_COMMUNITY): Payer: Self-pay | Admitting: Nurse Practitioner

## 2020-01-15 ENCOUNTER — Other Ambulatory Visit (HOSPITAL_COMMUNITY): Payer: Self-pay | Admitting: Hematology

## 2020-01-15 DIAGNOSIS — C61 Malignant neoplasm of prostate: Secondary | ICD-10-CM

## 2020-01-18 ENCOUNTER — Other Ambulatory Visit (HOSPITAL_COMMUNITY): Payer: Self-pay | Admitting: *Deleted

## 2020-01-18 DIAGNOSIS — C61 Malignant neoplasm of prostate: Secondary | ICD-10-CM

## 2020-01-18 DIAGNOSIS — C7951 Secondary malignant neoplasm of bone: Secondary | ICD-10-CM

## 2020-01-18 MED ORDER — PROCHLORPERAZINE MALEATE 10 MG PO TABS: 10.0000 mg | ORAL_TABLET | Freq: Four times a day (QID) | ORAL | 1 refills | Status: DC | PRN

## 2020-01-23 ENCOUNTER — Ambulatory Visit (HOSPITAL_BASED_OUTPATIENT_CLINIC_OR_DEPARTMENT_OTHER)
Admission: RE | Admit: 2020-01-23 | Discharge: 2020-01-23 | Disposition: A | Discharge status: Home / Self Care | Payer: Medicare Other | Source: Ambulatory Visit | Attending: Hematology | Admitting: Hematology

## 2020-01-23 ENCOUNTER — Other Ambulatory Visit: Payer: Self-pay

## 2020-01-23 ENCOUNTER — Ambulatory Visit (HOSPITAL_COMMUNITY)
Admission: RE | Admit: 2020-01-23 | Discharge: 2020-01-23 | Disposition: A | Discharge status: Home / Self Care | Payer: Medicare Other | Source: Ambulatory Visit | Attending: Hematology | Admitting: Hematology

## 2020-01-23 ENCOUNTER — Encounter (HOSPITAL_COMMUNITY): Payer: Self-pay

## 2020-01-23 DIAGNOSIS — C61 Malignant neoplasm of prostate: Secondary | ICD-10-CM | POA: Diagnosis not present

## 2020-01-23 DIAGNOSIS — Z8546 Personal history of malignant neoplasm of prostate: Secondary | ICD-10-CM | POA: Diagnosis not present

## 2020-01-23 DIAGNOSIS — C7951 Secondary malignant neoplasm of bone: Secondary | ICD-10-CM | POA: Diagnosis not present

## 2020-01-23 MED ORDER — TECHNETIUM TC 99M MEDRONATE IV KIT
20.0000 | PACK | Freq: Once | INTRAVENOUS | Status: AC | PRN
  Administered 2020-01-23: 20.2 via INTRAVENOUS

## 2020-01-24 ENCOUNTER — Inpatient Hospital Stay (HOSPITAL_BASED_OUTPATIENT_CLINIC_OR_DEPARTMENT_OTHER): Payer: Medicare Other | Admitting: Oncology

## 2020-01-24 ENCOUNTER — Inpatient Hospital Stay (HOSPITAL_COMMUNITY): Payer: Medicare Other

## 2020-01-24 VITALS — BP 115/75 | HR 95 | Temp 98.0°F | Resp 20 | Wt 224.8 lb

## 2020-01-24 VITALS — BP 103/67 | HR 82 | Temp 97.7°F | Resp 18

## 2020-01-24 DIAGNOSIS — C7951 Secondary malignant neoplasm of bone: Secondary | ICD-10-CM

## 2020-01-24 DIAGNOSIS — C61 Malignant neoplasm of prostate: Secondary | ICD-10-CM

## 2020-01-24 DIAGNOSIS — G893 Neoplasm related pain (acute) (chronic): Secondary | ICD-10-CM

## 2020-01-24 DIAGNOSIS — Z5189 Encounter for other specified aftercare: Secondary | ICD-10-CM | POA: Diagnosis not present

## 2020-01-24 DIAGNOSIS — Z5111 Encounter for antineoplastic chemotherapy: Secondary | ICD-10-CM | POA: Diagnosis not present

## 2020-01-24 LAB — COMPREHENSIVE METABOLIC PANEL
ALT: 12 U/L (ref 0–44)
AST: 11 U/L — ABNORMAL LOW (ref 15–41)
Albumin: 3.4 g/dL — ABNORMAL LOW (ref 3.5–5.0)
Alkaline Phosphatase: 89 U/L (ref 38–126)
Anion gap: 7 (ref 5–15)
BUN: 10 mg/dL (ref 8–23)
CO2: 27 mmol/L (ref 22–32)
Calcium: 8.9 mg/dL (ref 8.9–10.3)
Chloride: 105 mmol/L (ref 98–111)
Creatinine, Ser: 0.96 mg/dL (ref 0.61–1.24)
GFR calc Af Amer: >60 mL/min (ref >60)
GFR calc non Af Amer: >60 mL/min (ref >60)
Glucose, Bld: 101 mg/dL — ABNORMAL HIGH (ref 70–99)
Potassium: 3.8 mmol/L (ref 3.5–5.1)
Sodium: 139 mmol/L (ref 135–145)
Total Bilirubin: 0.6 mg/dL (ref 0.3–1.2)
Total Protein: 6.1 g/dL — ABNORMAL LOW (ref 6.5–8.1)

## 2020-01-24 LAB — CBC WITH DIFFERENTIAL/PLATELET
Abs Immature Granulocytes: 0.03 10*3/uL (ref 0.00–0.07)
Basophils Absolute: 0 10*3/uL (ref 0.0–0.1)
Basophils Relative: 1 %
Eosinophils Absolute: 0.1 10*3/uL (ref 0.0–0.5)
Eosinophils Relative: 1 %
HCT: 33.6 % — ABNORMAL LOW (ref 39.0–52.0)
Hemoglobin: 10.9 g/dL — ABNORMAL LOW (ref 13.0–17.0)
Immature Granulocytes: 1 %
Lymphocytes Relative: 15 %
Lymphs Abs: 0.9 10*3/uL (ref 0.7–4.0)
MCH: 32.3 pg (ref 26.0–34.0)
MCHC: 32.4 g/dL (ref 30.0–36.0)
MCV: 99.7 fL (ref 80.0–100.0)
Monocytes Absolute: 0.5 10*3/uL (ref 0.1–1.0)
Monocytes Relative: 9 %
Neutro Abs: 4.5 10*3/uL (ref 1.7–7.7)
Neutrophils Relative %: 73 %
Platelets: 241 10*3/uL (ref 150–400)
RBC: 3.37 MIL/uL — ABNORMAL LOW (ref 4.22–5.81)
RDW: 15.2 % (ref 11.5–15.5)
WBC: 6 10*3/uL (ref 4.0–10.5)
nRBC: 0 % (ref 0.0–0.2)

## 2020-01-24 LAB — PSA: Prostatic Specific Antigen: 106.2 ng/mL — ABNORMAL HIGH (ref 0.00–4.00)

## 2020-01-24 MED ORDER — SODIUM CHLORIDE 0.9 % IV SOLN
10.0000 mg | Freq: Once | INTRAVENOUS | Status: AC
  Administered 2020-01-24: 10 mg via INTRAVENOUS
  Filled 2020-01-24: qty 10

## 2020-01-24 MED ORDER — HEPARIN SOD (PORK) LOCK FLUSH 100 UNIT/ML IV SOLN
500.0000 [IU] | Freq: Once | INTRAVENOUS | Status: AC | PRN
  Administered 2020-01-24: 500 [IU]

## 2020-01-24 MED ORDER — SODIUM CHLORIDE 0.9 % IV SOLN
60.0000 mg/m2 | Freq: Once | INTRAVENOUS | Status: AC
  Administered 2020-01-24: 140 mg via INTRAVENOUS
  Filled 2020-01-24: qty 14

## 2020-01-24 MED ORDER — PEGFILGRASTIM 6 MG/0.6ML ~~LOC~~ PSKT
6.0000 mg | PREFILLED_SYRINGE | Freq: Once | SUBCUTANEOUS | Status: AC
  Administered 2020-01-24: 6 mg via SUBCUTANEOUS
  Filled 2020-01-24: qty 0.6

## 2020-01-24 MED ORDER — FENTANYL 75 MCG/HR TD PT72: 1.0000 | MEDICATED_PATCH | TRANSDERMAL | 0 refills | Status: DC

## 2020-01-24 MED ORDER — SODIUM CHLORIDE 0.9% FLUSH
10.0000 mL | INTRAVENOUS | Status: DC | PRN
  Administered 2020-01-24: 10 mL

## 2020-01-24 MED ORDER — LEUPROLIDE ACETATE (3 MONTH) 22.5 MG ~~LOC~~ KIT
22.5000 mg | PACK | Freq: Once | SUBCUTANEOUS | Status: AC
  Administered 2020-01-24: 22.5 mg via SUBCUTANEOUS
  Filled 2020-01-24: qty 22.5

## 2020-01-24 MED ORDER — SODIUM CHLORIDE 0.9 % IV SOLN: Freq: Once | INTRAVENOUS | Status: AC

## 2020-01-24 MED ORDER — PALONOSETRON HCL INJECTION 0.25 MG/5ML
0.2500 mg | Freq: Once | INTRAVENOUS | Status: AC
  Administered 2020-01-24: 0.25 mg via INTRAVENOUS
  Filled 2020-01-24: qty 5

## 2020-01-24 NOTE — Progress Notes (Signed)
Patient has been assessed, vital signs and labs have been reviewed by Tom Kefalas PA. ANC, Creatinine, LFTs, and Platelets are within treatment parameters per Tom Kefalas PA. The patient is good to proceed with treatment at this time.   

## 2020-01-24 NOTE — Patient Instructions (Signed)
[facility] at [facility] Discharge Instructions  Labs drawn from portacath today   Thank you for choosing [facility] at [facility] to provide your oncology and hematology care.  To afford each patient quality time with our provider, please arrive at least 15 minutes before your scheduled appointment time.   If you have a lab appointment with the Cancer Center please come in thru the Main Entrance and check in at the main information desk.  You need to re-schedule your appointment should you arrive 10 or more minutes late.  We strive to give you quality time with our providers, and arriving late affects you and other patients whose appointments are after yours.  Also, if you no show three or more times for appointments you may be dismissed from the clinic at the providers discretion.     Again, thank you for choosing [facility].  Our hope is that these requests will decrease the amount of time that you wait before being seen by our physicians.       _____________________________________________________________  Should you have questions after your visit to  Cancer Center, please contact our office at [phone] between the hours of 8:00 a.m. and 4:30 p.m.  Voicemails left after 4:00 p.m. will not be returned until the following business day.  For prescription refill requests, have your pharmacy contact our office and allow 72 hours.    Due to Covid, you will need to wear a mask upon entering the hospital. If you do not have a mask, a mask will be given to you at the Main Entrance upon arrival. For doctor visits, patients may have 1 support person with them. For treatment visits, patients can not have anyone with them due to social distancing guidelines and our immunocompromised population.     

## 2020-01-24 NOTE — Patient Instructions (Signed)
[facility] at [facility] Discharge Instructions  You were seen today by Tom Kefalas PA. He went over your recent lab results. He will see you back in 3 weeks for labs, treatment and follow up.   Thank you for choosing [facility] at [facility] to provide your oncology and hematology care.  To afford each patient quality time with our provider, please arrive at least 15 minutes before your scheduled appointment time.   If you have a lab appointment with the Cancer Center please come in thru the  Main Entrance and check in at the main information desk  You need to re-schedule your appointment should you arrive 10 or more minutes late.  We strive to give you quality time with our providers, and arriving late affects you and other patients whose appointments are after yours.  Also, if you no show three or more times for appointments you may be dismissed from the clinic at the providers discretion.     Again, thank you for choosing [facility].  Our hope is that these requests will decrease the amount of time that you wait before being seen by our physicians.       _____________________________________________________________  Should you have questions after your visit to [facility], please contact our office at [phone] between the hours of 8:00 a.m. and 4:30 p.m.  Voicemails left after 4:00 p.m. will not be returned until the following business day.  For prescription refill requests, have your pharmacy contact our office and allow 72 hours.    Cancer Center Support Programs:   > Cancer Support Group  2nd Tuesday of the month 1pm-2pm, Journey Room    

## 2020-01-24 NOTE — Progress Notes (Signed)
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 -Abiraterone/prednisone, cabazitaxel with progression. -Guardant 360 showed ATM mutation. -Olaparib 300 mg twice daily from 07/28/2019 through 11/03/2019 with progression. -Bone scan on 11/10/2019 showed worsening disease.  PSA was 132 on 11/21/2019. -CT scan on 08/11/2019 showed bone metastasis with no visceral metastasis. -Docetaxel 60 mg per metered square started on 11/21/2019.  CURRENT STATUS: Matthew [name] 64 y.o. male returns for followup of in follow-up of metastatic prostate cancer currently on docetaxel chemotherapy with palliative intent.  He thus far is tolerating therapy reasonably well.  He reports a 2-week history of fatigue and tiredness without nausea or vomiting and then 1 week prior to treatment he started feeling better.  This is typical and he is managing well.  He denies any new lumps or bumps on his examination.  No new pain.  He does have chronic cancer induced pain and he is currently on fentanyl therapy.  He needs a refill on his Duragesic patches and I sent this to his pharmacy accordingly.  No new cough or hemoptysis.  He denies any new bone pain.  His bone scan recently was very stable.  He does admit to an increase in pain following his bone scan due to him having to leave for an extended period of time on a hard table  No new neurological deficits including headaches, dizziness, double vision, LOC, seizure.  Review of Systems  Constitutional: Positive for malaise/fatigue. Negative for chills, fever and weight loss.  HENT: Negative.   Eyes: Negative.   Respiratory: Negative.   Negative for cough.   Cardiovascular: Negative.  Negative for chest pain.  Gastrointestinal: Negative.  Negative for blood in stool, constipation, diarrhea, melena, nausea and vomiting.  Genitourinary: Negative.   Musculoskeletal: Positive for back pain (Chronic).  Skin: Negative.   Neurological: Positive for weakness.  Endo/Heme/Allergies: Negative.   Psychiatric/Behavioral: Negative.     Past Medical History:  Diagnosis Date  . Arthritis    Left knee  . Bone cancer ([place])   . Bone metastases ([place]) 12/26/2015  . Cancer [facility])    Prostate  . DVT (deep venous thrombosis) ([place]) 2004  . Family history of prostate cancer   . Family history of stomach cancer   . Kidney stones    ~2002  . Prostate carcinoma ([place]) 12/26/2015    Past Surgical History:  Procedure Laterality Date  . CIRCUMCISION  30 years ago  . MULTIPLE EXTRACTIONS WITH ALVEOLOPLASTY N/A 03/06/2016   Procedure: Extraction of tooth #'s 3,5,6,8-12,20-22, 27, 28, and 31 with alveoloplasty and bilateral mandibular tori reductions;  Surgeon: Lenn Cal, DDS;  Location: [place];  Service: Oral Surgery;  Laterality: N/A;    Family History  Problem Relation Age of Onset  . Stomach cancer Mother        dx 12s  . Aneurysm Maternal Aunt        brain  . Alcohol abuse Maternal Uncle   . Stomach cancer Maternal Grandmother        dx 50s-60s  . Prostate cancer Maternal Uncle        dx 40s    Social History   Socioeconomic History  .  Marital status: Single    Spouse name: Not on file  . Number of children: 2  . Years of education: Not on file  . Highest education level: Not on file  Occupational History  . Occupation: Construction/carpenter  Tobacco Use  . Smoking status: Current Every Day Smoker    Packs/day: 1.00    Years: 50.00    Pack years: 50.00    Types: Cigarettes    Start date: 06/16/1965  . Smokeless tobacco: Former Systems developer    Types: [place] date: 08/04/2005  Substance and Sexual Activity  .  Alcohol use: No    Alcohol/week: 0.0 standard drinks  . Drug use: Yes    Types: Marijuana    Comment: THC for appetite; occ  . Sexual activity: Not on file  Other Topics Concern  . Not on file  Social History Narrative  . Not on file   Social Determinants of Health   Financial Resource Strain:   . Difficulty of Paying Living Expenses:   Food Insecurity:   . Worried About Charity fundraiser in the Last Year:   . Arboriculturist in the Last Year:   Transportation Needs:   . Film/video editor (Medical):   Marland Kitchen Lack of Transportation (Non-Medical):   Physical Activity:   . Days of Exercise per Week:   . Minutes of Exercise per Session:   Stress:   . Feeling of Stress :   Social Connections:   . Frequency of Communication with Friends and Family:   . Frequency of Social Gatherings with Friends and Family:   . Attends Religious Services:   . Active Member of Clubs or Organizations:   . Attends Archivist Meetings:   Marland Kitchen Marital Status:      PHYSICAL EXAMINATION  ECOG PERFORMANCE STATUS: 1 - Symptomatic but completely ambulatory  Vitals:   01/24/20 1020  BP: 115/75  Pulse: 95  Resp: 20  Temp: 98 F (36.7 C)  SpO2: 98%    GENERAL:alert, no distress, well nourished, well developed, comfortable, cooperative and unaccomapnied SKIN: skin color, texture, turgor are normal, no rashes or significant lesions HEAD: Normocephalic, No masses, lesions, tenderness or abnormalities EYES: normal EARS: External ears normal OROPHARYNX:Not examined- mask in place  NECK: supple LYMPH:  no palpable lymphadenopathy BREAST:not examined LUNGS: clear to auscultation  HEART: regular rate & rhythm ABDOMEN:non-tender and normal bowel sounds BACK: Back symmetric, no curvature. EXTREMITIES:less then 2 second capillary refill, no skin discoloration  NEURO: alert & oriented x 3 with fluent speech   LABORATORY DATA: CBC    Component Value Date/Time   WBC 6.0 01/24/2020 0950     RBC 3.37 (L) 01/24/2020 0950   HGB 10.9 (L) 01/24/2020 0950   HCT 33.6 (L) 01/24/2020 0950   PLT 241 01/24/2020 0950   MCV 99.7 01/24/2020 0950   MCH 32.3 01/24/2020 0950   MCHC 32.4 01/24/2020 0950   RDW 15.2 01/24/2020 0950   LYMPHSABS 0.9 01/24/2020 0950   MONOABS 0.5 01/24/2020 0950   EOSABS 0.1 01/24/2020 0950   BASOSABS 0.0 01/24/2020 0950      Chemistry      Component Value Date/Time   NA 139 01/24/2020 0950   K 3.8 01/24/2020 0950   CL 105 01/24/2020 0950   CO2 27 01/24/2020 0950   BUN 10 01/24/2020 0950   CREATININE 0.96 01/24/2020 0950      Component Value Date/Time   CALCIUM 8.9 01/24/2020 0950   ALKPHOS  89 01/24/2020 0950   AST 11 (L) 01/24/2020 0950   ALT 12 01/24/2020 0950   BILITOT 0.6 01/24/2020 0950       RADIOGRAPHIC STUDIES:  NM Bone Scan Whole Body  Result Date: 01/24/2020 CLINICAL DATA:  History of metastatic prostate cancer. Diffuse back pain. EXAM: NUCLEAR MEDICINE WHOLE BODY BONE SCAN TECHNIQUE: Whole body anterior and posterior images were obtained approximately 3 hours after intravenous injection of radiopharmaceutical. RADIOPHARMACEUTICALS:  20.2 mCi Technetium-71mMDP IV COMPARISON:  11/10/2019 FINDINGS: Expected radiotracer activity is present in the urinary tract. Numerous foci of abnormal skeletal uptake are unchanged including 3 discrete foci of calvarial uptake and abnormal uptake in the upper cervical spine, lower thoracic spine (including prominent T10 and T12 vertebral involvement), lumbar spine (including prominent L3 involvement), multiple bilateral ribs, right humeral shaft, and bilateral bony pelvis (including intense uptake in the left ilium). No definite new lesions are identified. Uptake at the shoulders and left greater than right knees is unchanged and likely degenerative. IMPRESSION: Unchanged osseous metastatic disease. Electronically Signed   By: ALogan BoresM.D.   On: 01/24/2020 08:39     PATHOLOGY:    ASSESSMENT AND  PLAN:  1. 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 -Abiraterone/prednisone, cabazitaxel with progression. -Guardant 360 showed ATM mutation. -Olaparib 300 mg twice daily from 07/28/2019 through 11/03/2019 with progression. -Bone scan on 11/10/2019 showed worsening disease.  PSA was 132 on 11/21/2019. -CT scan on 08/11/2019 showed bone metastasis with no visceral metastasis. -Docetaxel 60 mg per metered square started on 11/21/2019.  Labs updated today and satisfy treatment parameters.   Today is D1C4 of therapy.  Nursing, in accordance with chemotherapy administration protocol, will monitor for acute side effects/toxicities associated with chemotherapy administration today.  I personally reviewed and went over radiographic studies with the patient.  The results are noted within this dictation.  I personally reviewed the images in PACS.  Bone scan yesterday was stable without any new sites of disease or progression of disease.   Documents and imaging are reviewed related to cancer care.  Return in 3 weeks for follow-up and ongoing treatment.  2. Bone metastases ([place] Bone targeted therapy on hold currently.  3. Cancer related pain On Fentanyl therapy, gabapentin.  I have electronically refilled his fentanyl patches and sent to his preferred pharmacy electronically.  ORDERS PLACED FOR THIS ENCOUNTER: No orders of the defined types were placed in this encounter.   MEDICATIONS PRESCRIBED THIS ENCOUNTER: Meds ordered this encounter  Medications  . fentaNYL (DURAGESIC) 75 MCG/HR    Sig: Place 1 patch onto the skin every 3 (three) days.    Dispense:  10 patch    Refill:  0    Order Specific Question:   Supervising Provider    Answer:   KDerek Jack[[[id number]   All questions were answered. The patient knows to call the clinic with any problems, questions or  concerns. We can certainly see the patient much sooner if necessary.  Patient and plan discussed with Dr. SDerek Jackand she is in agreement with the aforementioned.   This note is electronically signed by: TRobynn Pane PA-C 01/24/2020 11:05 AM

## 2020-01-24 NOTE — Progress Notes (Signed)
Patient tolerated chemotherapy with no complaints voiced.  Side effects with management reviewed with understanding verbalized.  Port site clean and dry with no bruising or swelling noted at site.  Good blood return noted before and after administration of chemotherapy.  Band aid applied.  Neulasta Onpro applied with green indicator light flashing.  Patient left ambulatory with VSS and no s/s of distress noted.

## 2020-01-25 ENCOUNTER — Other Ambulatory Visit (HOSPITAL_COMMUNITY): Payer: Self-pay | Admitting: *Deleted

## 2020-01-25 DIAGNOSIS — C61 Malignant neoplasm of prostate: Secondary | ICD-10-CM

## 2020-02-10 ENCOUNTER — Other Ambulatory Visit (HOSPITAL_COMMUNITY): Payer: Self-pay | Admitting: Surgery

## 2020-02-10 DIAGNOSIS — C7951 Secondary malignant neoplasm of bone: Secondary | ICD-10-CM

## 2020-02-10 DIAGNOSIS — G893 Neoplasm related pain (acute) (chronic): Secondary | ICD-10-CM

## 2020-02-10 DIAGNOSIS — C61 Malignant neoplasm of prostate: Secondary | ICD-10-CM

## 2020-02-10 MED ORDER — HYDROCODONE-ACETAMINOPHEN 10-325 MG PO TABS: 1.0000 | ORAL_TABLET | Freq: Four times a day (QID) | ORAL | 0 refills | Status: DC | PRN

## 2020-02-14 ENCOUNTER — Inpatient Hospital Stay (HOSPITAL_COMMUNITY): Payer: Medicare Other

## 2020-02-14 ENCOUNTER — Other Ambulatory Visit: Payer: Self-pay

## 2020-02-14 ENCOUNTER — Inpatient Hospital Stay (HOSPITAL_BASED_OUTPATIENT_CLINIC_OR_DEPARTMENT_OTHER): Payer: Medicare Other | Admitting: Hematology

## 2020-02-14 ENCOUNTER — Inpatient Hospital Stay (HOSPITAL_COMMUNITY): Payer: Medicare Other | Attending: Hematology

## 2020-02-14 VITALS — BP 99/70 | HR 82 | Temp 97.5°F | Resp 18

## 2020-02-14 DIAGNOSIS — Z5111 Encounter for antineoplastic chemotherapy: Secondary | ICD-10-CM | POA: Insufficient documentation

## 2020-02-14 DIAGNOSIS — C61 Malignant neoplasm of prostate: Secondary | ICD-10-CM | POA: Diagnosis not present

## 2020-02-14 DIAGNOSIS — C7951 Secondary malignant neoplasm of bone: Secondary | ICD-10-CM | POA: Diagnosis not present

## 2020-02-14 DIAGNOSIS — Z5189 Encounter for other specified aftercare: Secondary | ICD-10-CM | POA: Insufficient documentation

## 2020-02-14 DIAGNOSIS — R300 Dysuria: Secondary | ICD-10-CM

## 2020-02-14 LAB — CBC WITH DIFFERENTIAL/PLATELET
Abs Immature Granulocytes: 0.04 10*3/uL (ref 0.00–0.07)
Basophils Absolute: 0 10*3/uL (ref 0.0–0.1)
Basophils Relative: 1 %
Eosinophils Absolute: 0.1 10*3/uL (ref 0.0–0.5)
Eosinophils Relative: 1 %
HCT: 35.6 % — ABNORMAL LOW (ref 39.0–52.0)
Hemoglobin: 11.3 g/dL — ABNORMAL LOW (ref 13.0–17.0)
Immature Granulocytes: 1 %
Lymphocytes Relative: 13 %
Lymphs Abs: 0.9 10*3/uL (ref 0.7–4.0)
MCH: 31.1 pg (ref 26.0–34.0)
MCHC: 31.7 g/dL (ref 30.0–36.0)
MCV: 98.1 fL (ref 80.0–100.0)
Monocytes Absolute: 0.5 10*3/uL (ref 0.1–1.0)
Monocytes Relative: 7 %
Neutro Abs: 5.5 10*3/uL (ref 1.7–7.7)
Neutrophils Relative %: 77 %
Platelets: 271 10*3/uL (ref 150–400)
RBC: 3.63 MIL/uL — ABNORMAL LOW (ref 4.22–5.81)
RDW: 15.4 % (ref 11.5–15.5)
WBC: 7.1 10*3/uL (ref 4.0–10.5)
nRBC: 0 % (ref 0.0–0.2)

## 2020-02-14 LAB — URINALYSIS, COMPLETE (UACMP) WITH MICROSCOPIC
Bacteria, UA: NONE SEEN
Bilirubin Urine: NEGATIVE
Glucose, UA: NEGATIVE mg/dL
Hgb urine dipstick: NEGATIVE
Ketones, ur: NEGATIVE mg/dL
Leukocytes,Ua: NEGATIVE
Nitrite: NEGATIVE
Protein, ur: 30 mg/dL — AB
Specific Gravity, Urine: 1.018 (ref 1.005–1.030)
pH: 5 (ref 5.0–8.0)

## 2020-02-14 LAB — COMPREHENSIVE METABOLIC PANEL
ALT: 9 U/L (ref 0–44)
AST: 14 U/L — ABNORMAL LOW (ref 15–41)
Albumin: 3.3 g/dL — ABNORMAL LOW (ref 3.5–5.0)
Alkaline Phosphatase: 115 U/L (ref 38–126)
Anion gap: 11 (ref 5–15)
BUN: 8 mg/dL (ref 8–23)
CO2: 24 mmol/L (ref 22–32)
Calcium: 8.9 mg/dL (ref 8.9–10.3)
Chloride: 103 mmol/L (ref 98–111)
Creatinine, Ser: 1.05 mg/dL (ref 0.61–1.24)
GFR calc Af Amer: >60 mL/min (ref >60)
GFR calc non Af Amer: >60 mL/min (ref >60)
Glucose, Bld: 120 mg/dL — ABNORMAL HIGH (ref 70–99)
Potassium: 3.4 mmol/L — ABNORMAL LOW (ref 3.5–5.1)
Sodium: 138 mmol/L (ref 135–145)
Total Bilirubin: 0.4 mg/dL (ref 0.3–1.2)
Total Protein: 6.3 g/dL — ABNORMAL LOW (ref 6.5–8.1)

## 2020-02-14 LAB — MAGNESIUM: Magnesium: 1.9 mg/dL (ref 1.7–2.4)

## 2020-02-14 MED ORDER — SODIUM CHLORIDE 0.9 % IV SOLN
10.0000 mg | Freq: Once | INTRAVENOUS | Status: AC
  Administered 2020-02-14: 10 mg via INTRAVENOUS
  Filled 2020-02-14: qty 1

## 2020-02-14 MED ORDER — SODIUM CHLORIDE 0.9 % IV SOLN: Freq: Once | INTRAVENOUS | Status: AC

## 2020-02-14 MED ORDER — HEPARIN SOD (PORK) LOCK FLUSH 100 UNIT/ML IV SOLN
500.0000 [IU] | Freq: Once | INTRAVENOUS | Status: AC | PRN
  Administered 2020-02-14: 500 [IU]

## 2020-02-14 MED ORDER — PALONOSETRON HCL INJECTION 0.25 MG/5ML
0.2500 mg | Freq: Once | INTRAVENOUS | Status: AC
  Administered 2020-02-14: 0.25 mg via INTRAVENOUS
  Filled 2020-02-14: qty 5

## 2020-02-14 MED ORDER — SODIUM CHLORIDE 0.9% FLUSH
10.0000 mL | INTRAVENOUS | Status: DC | PRN
  Administered 2020-02-14: 10 mL

## 2020-02-14 MED ORDER — SODIUM CHLORIDE 0.9 % IV SOLN
60.0000 mg/m2 | Freq: Once | INTRAVENOUS | Status: AC
  Administered 2020-02-14: 140 mg via INTRAVENOUS
  Filled 2020-02-14: qty 14

## 2020-02-14 MED ORDER — ONDANSETRON HCL 8 MG PO TABS: ORAL_TABLET | ORAL | 6 refills | Status: AC

## 2020-02-14 MED ORDER — PROCHLORPERAZINE MALEATE 10 MG PO TABS: 10.0000 mg | ORAL_TABLET | Freq: Four times a day (QID) | ORAL | 6 refills | Status: AC | PRN

## 2020-02-14 MED ORDER — PEGFILGRASTIM 6 MG/0.6ML ~~LOC~~ PSKT
6.0000 mg | PREFILLED_SYRINGE | Freq: Once | SUBCUTANEOUS | Status: AC
  Administered 2020-02-14: 6 mg via SUBCUTANEOUS
  Filled 2020-02-14: qty 0.6

## 2020-02-14 NOTE — Progress Notes (Signed)
Ok to treat today verbal order Dr. Katragadda.   Patient tolerated chemotherapy with no complaints voiced.  Side effects with management reviewed with understanding verbalized.  Port site clean and dry with no bruising or swelling noted at site.  Good blood return noted before and after administration of chemotherapy.  Band aid applied.  Patient left in satisfactory condition with VSS and no s/s of distress noted.  

## 2020-02-14 NOTE — Progress Notes (Addendum)
[place],  [address]   CLINIC:  Medical Oncology/Hematology  PCP:  [name], [place] / [place] [address] [phone]   REASON FOR VISIT:  Follow-up for metastatic prostate cancer to bone  CURRENT THERAPY: Docetaxel  BRIEF ONCOLOGIC HISTORY:  Oncology History  Prostate carcinoma ([place])  12/10/2015 Imaging   CTA chest, multiple thoracic and rib osseous lesions. no primary evident. Atherosclerosis including aortic and CAD   12/12/2015 Imaging   CT abdomen/pelvis with sclerotic osseous lesions througout the lumbar spine, sacrum, bilateral iliac bones and L proximal femur worrisome for sclerotic osseous mets,mild prostatomegaly, no LAD, Infrarenal 3.8 cm AAA   12/12/2015 Tumor Marker   PSA 153.85   12/18/2015 Imaging   Bone Scan Diffuse bony metastatic disease, posterior calvarium, sternum, thoracic, lumbar spine, bilateral ribs, L shoulder, bilateral bony pelvis, proximal L femur   12/24/2015 Initial Biopsy   CT biopsy of L iliac bone lesion   12/26/2015 Pathology Results   Bone, biopsy, left iliac - POSITIVE FOR METASTATIC ADENOCARCINOMA.   12/28/2015 - 09/01/2016 Chemotherapy   Firmagon instituted 240 mg    01/01/2016 Procedure   Port placed by IR.   01/07/2016 - 04/11/2016 Chemotherapy   Docetaxel every 21 days x 6 cycles with dose reductions due to tolerance   02/18/2016 Adverse Reaction   GI toxicity from docetaxel, dose reduced to 60 mg/m2   02/18/2016 Treatment Plan Change   Docetaxel dose reduced by 20%   03/06/2016 Procedure   Dr. Enrique Sack- Multiple extraction of tooth numbers 3, 5, 6, 8, 9, 10, 11, 12, 20, 21, 22, 27, 28, and 31. 4 Quadrants of alveoloplasty. Bilateral mandibular lingual tori reductions.   04/09/2016 Imaging   Brain MRI 1. No intracranial metastatic disease 2. No acute intracranial abnormality 3. Findings of chronic microvascular disease   05/14/2016 Imaging   CT C/A/P Stable  appearing diffuse bony metastatic disease. No new significant disease is seen Innumerable patchy sclerotic osseous metastases throughout the axial and proximal appendicular skeleton, stable in size and distribution, significantly increased in density in the interval, likely reflecting treatment effect. 2. No new or progressive metastatic disease in the chest, abdomen or pelvis. 3. Bilateral perifissural pulmonary nodules are stable and probably benign. 4. Aortic atherosclerosis. Infrarenal 4.3 cm abdominal aortic aneurysm, minimally increased in size. Recommend followup by ultrasound in 1 year.   06/02/2016 Tumor Marker   PSA 0.99 ng/ml    - 07/31/2016 Radiation Therapy   Palliative XRT by Dr. Lianne Cure in [place].    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  The Multi-Gene Panel offered by [facility] includes sequencing and/or deletion duplication testing of the following 85 genes: AIP, ALK, APC, ATM, AXIN2,BAP1,  BARD1, BLM, BMPR1A, BRCA1, BRCA2, BRIP1, CASR, CDC73, CDH1, CDK4, CDKN1B, CDKN1C, CDKN2A (p14ARF), CDKN2A (p16INK4a), CEBPA, CHEK2, CTNNA1, DICER1, DIS3L2, EGFR (c.2369C>T, p.Thr790Met variant only), EPCAM (Deletion/duplication testing only), FH, FLCN, GATA2, GPC3, GREM1 (Promoter region deletion/duplication testing only), HOXB13 (c.251G>A, p.Gly84Glu), HRAS, KIT, MAX, MEN1, MET, MITF (c.952G>A, p.Glu318Lys variant only), MLH1, MSH2, MSH3, MSH6, MUTYH, NBN, NF1, NF2, NTHL1, PALB2, PDGFRA, PHOX2B, PMS2, POLD1, POLE, POT1, PRKAR1A, PTCH1, PTEN, RAD50, RAD51C, RAD51D, RB1, RECQL4, RET, RNF43, RUNX1, SDHAF2, SDHA (sequence changes only), SDHB, SDHC, SDHD, SMAD4, SMARCA4, SMARCB1, SMARCE1, STK11, SUFU, TERC, TERT, TMEM127, TP53, TSC1, TSC2, VHL, WRN and WT1.  The report  date is August 24, 2018  This is not thought to increase the risk for colon cancer or other  cancers, unless there is the presence of two pathogenic mutations.   09/27/2018 - 06/27/2019 Chemotherapy   The patient had palonosetron (ALOXI) injection 0.25 mg, 0.25 mg, Intravenous,  Once, 10 of 10 cycles Administration: 0.25 mg (09/27/2018), 0.25 mg (10/25/2018), 0.25 mg (11/23/2018), 0.25 mg (12/21/2018), 0.25 mg (01/18/2019), 0.25 mg (02/15/2019), 0.25 mg (03/15/2019), 0.25 mg (04/12/2019), 0.25 mg (05/10/2019), 0.25 mg (06/07/2019) pegfilgrastim (NEULASTA ONPRO KIT) injection 6 mg, 6 mg, Subcutaneous, Once, 9 of 9 cycles Administration: 6 mg (10/25/2018), 6 mg (11/23/2018), 6 mg (12/21/2018), 6 mg (01/18/2019), 6 mg (02/15/2019), 6 mg (03/15/2019), 6 mg (04/12/2019), 6 mg (05/10/2019), 6 mg (06/07/2019) pegfilgrastim-cbqv (UDENYCA) injection 6 mg, 6 mg, Subcutaneous, Once, 1 of 1 cycle Administration: 6 mg (09/29/2018) cabazitaxel (JEVTANA) 35 mg in dextrose 5 % 250 mL chemo infusion, 15 mg/m2 = 35 mg (100 % of original dose 15 mg/m2), Intravenous,  Once, 10 of 10 cycles Dose modification: 15 mg/m2 (original dose 15 mg/m2, Cycle 1, Reason: Provider Judgment), 20 mg/m2 (original dose 15 mg/m2, Cycle 2, Reason: Provider Judgment), 20 mg/m2 (original dose 20 mg/m2, Cycle 5, Reason: Patient Age, Comment: Change in jevtana orderable), 15 mg/m2 (75 % of original dose 20 mg/m2, Cycle 8, Reason: Other (see comments), Comment: nausea, worsening numbness) Administration: 35 mg (09/27/2018), 46 mg (10/25/2018), 46 mg (11/23/2018), 46 mg (12/21/2018), 46 mg (01/18/2019), 46 mg (02/15/2019), 46 mg (03/15/2019), 35 mg (04/12/2019), 35 mg (05/10/2019), 35 mg (06/07/2019)  for chemotherapy treatment.    11/21/2019 -  Chemotherapy   The patient had palonosetron (ALOXI) injection 0.25 mg, 0.25 mg, Intravenous,  Once, 4 of 5 cycles Administration: 0.25 mg (11/21/2019), 0.25 mg (12/12/2019), 0.25 mg (01/03/2020), 0.25 mg (01/24/2020) pegfilgrastim (NEULASTA ONPRO KIT) injection 6 mg, 6 mg, Subcutaneous, Once, 4 of 5 cycles Administration: 6 mg  (11/21/2019), 6 mg (12/12/2019), 6 mg (01/03/2020), 6 mg (01/24/2020) DOCEtaxel (TAXOTERE) 140 mg in sodium chloride 0.9 % 250 mL chemo infusion, 60 mg/m2 = 140 mg (80 % of original dose 75 mg/m2), Intravenous,  Once, 4 of 5 cycles Dose modification: 60 mg/m2 (80 % of original dose 75 mg/m2, Cycle 1, Reason: Other (see comments), Comment: based on previous tolerabiliity in 2017) Administration: 140 mg (11/21/2019), 140 mg (12/12/2019), 140 mg (01/03/2020), 140 mg (01/24/2020)  for chemotherapy treatment.    Bone metastases ([place])  12/26/2015 Initial Diagnosis   Bone metastases ([place])   09/27/2018 - 06/27/2019 Chemotherapy   The patient had palonosetron (ALOXI) injection 0.25 mg, 0.25 mg, Intravenous,  Once, 4 of 6 cycles Administration: 0.25 mg (09/27/2018), 0.25 mg (10/25/2018), 0.25 mg (11/23/2018), 0.25 mg (12/21/2018) pegfilgrastim (NEULASTA ONPRO KIT) injection 6 mg, 6 mg, Subcutaneous, Once, 3 of 5 cycles Administration: 6 mg (10/25/2018), 6 mg (11/23/2018), 6 mg (12/21/2018) pegfilgrastim-cbqv (UDENYCA) injection 6 mg, 6 mg, Subcutaneous, Once, 1 of 1 cycle Administration: 6 mg (09/29/2018) cabazitaxel (JEVTANA) 35 mg in dextrose 5 % 250 mL chemo infusion, 15 mg/m2 = 35 mg (100 % of original dose 15 mg/m2), Intravenous,  Once, 4 of 6 cycles Dose modification: 15 mg/m2 (original dose 15 mg/m2, Cycle 1, Reason: Provider Judgment), 20 mg/m2 (original dose 15 mg/m2, Cycle 2, Reason: Provider Judgment) Administration: 35 mg (09/27/2018), 46 mg (10/25/2018), 46 mg (11/23/2018), 46 mg (12/21/2018)  for chemotherapy treatment.    11/21/2019 -  Chemotherapy   The patient had palonosetron (ALOXI) injection 0.25 mg, 0.25 mg, Intravenous,  Once, 4 of 5 cycles Administration: 0.25 mg (11/21/2019), 0.25 mg (12/12/2019), 0.25 mg (01/03/2020), 0.25 mg (01/24/2020) pegfilgrastim (NEULASTA ONPRO KIT) injection 6 mg, 6 mg, Subcutaneous, Once, 4 of 5 cycles Administration: 6 mg (11/21/2019), 6 mg (12/12/2019), 6 mg (01/03/2020), 6 mg  (01/24/2020) DOCEtaxel (TAXOTERE) 140 mg in sodium chloride 0.9 % 250 mL chemo infusion, 60 mg/m2 = 140 mg (80 % of original dose 75 mg/m2), Intravenous,  Once, 4 of 5 cycles Dose modification: 60 mg/m2 (80 % of original dose 75 mg/m2, Cycle 1, Reason: Other (see comments), Comment: based on previous tolerabiliity in 2017) Administration: 140 mg (11/21/2019), 140 mg (12/12/2019), 140 mg (01/03/2020), 140 mg (01/24/2020)  for chemotherapy treatment.      CANCER STAGING: Cancer Staging Prostate carcinoma [facility]) Staging form: Prostate, AJCC 7th Edition - Clinical stage from 12/24/2015: Stage IV (TX, N0, M1b, PSA: 20 or greater) - Signed by Baird Cancer, PA-C on 01/07/2016   INTERVAL HISTORY:  Matthew [name], a 64 y.o. male, returns for routine follow-up and consideration for next cycle of chemotherapy. Matthew [name] was last seen on 01/03/2020.  Due for cycle #5 of docetaxel and Aloxi today.   Today he reports having pain while urinating for the past 2 weeks, but denies any F/C. He reports that his urine has been dark yellow for about 2 weeks as well. He reports having intermittent pain in his scrotum and penis, but without swelling, lasting about 1 week approximately 2 weeks ago. He is still taking the Fentanyl patch daily which helps the pain without causing drowsiness.  Overall, he feels ready for next cycle of chemo today.    REVIEW OF SYSTEMS:  Review of Systems  Constitutional: Positive for appetite change (moderately decreased) and fatigue (severe). Negative for chills and fever.  HENT:   Positive for trouble swallowing.   Cardiovascular: Positive for leg swelling.  Gastrointestinal: Positive for abdominal pain (occasional) and constipation.  Genitourinary: Positive for dysuria.   Musculoskeletal: Positive for arthralgias (3/10 shoulder pain) and back pain (3/10 back and hip pain).  Neurological: Positive for headaches and numbness (toes).  All other systems reviewed and are  negative.   PAST MEDICAL/SURGICAL HISTORY:  Past Medical History:  Diagnosis Date  . Arthritis    Left knee  . Bone cancer ([place])   . Bone metastases ([place]) 12/26/2015  . Cancer [facility])    Prostate  . DVT (deep venous thrombosis) ([place]) 2004  . Family history of prostate cancer   . Family history of stomach cancer   . Kidney stones    ~2002  . Prostate carcinoma ([place]) 12/26/2015   Past Surgical History:  Procedure Laterality Date  . CIRCUMCISION  30 years ago  . MULTIPLE EXTRACTIONS WITH ALVEOLOPLASTY N/A 03/06/2016   Procedure: Extraction of tooth #'s 3,5,6,8-12,20-22, 27, 28, and 31 with alveoloplasty and bilateral mandibular tori reductions;  Surgeon: Lenn Cal, DDS;  Location: [place];  Service: Oral Surgery;  Laterality: N/A;    SOCIAL HISTORY:  Social History   Socioeconomic History  . Marital status: Single    Spouse name: Not on file  . Number of children: 2  . Years of education: Not on file  . Highest education level: Not on file  Occupational History  . Occupation: Construction/carpenter  Tobacco Use  . Smoking status: Current Every Day Smoker    Packs/day: 1.00    Years: 50.00    Pack years: 50.00    Types: Cigarettes    Start date: 06/16/1965  .  Smokeless tobacco: Former Systems developer    Types: [place] date: 08/04/2005  Substance and Sexual Activity  . Alcohol use: No    Alcohol/week: 0.0 standard drinks  . Drug use: Yes    Types: Marijuana    Comment: THC for appetite; occ  . Sexual activity: Not on file  Other Topics Concern  . Not on file  Social History Narrative  . Not on file   Social Determinants of Health   Financial Resource Strain:   . Difficulty of Paying Living Expenses:   Food Insecurity:   . Worried About Charity fundraiser in the Last Year:   . Arboriculturist in the Last Year:   Transportation Needs:   . Film/video editor (Medical):   Marland Kitchen Lack of Transportation (Non-Medical):   Physical Activity:   . Days of Exercise per  Week:   . Minutes of Exercise per Session:   Stress:   . Feeling of Stress :   Social Connections:   . Frequency of Communication with Friends and Family:   . Frequency of Social Gatherings with Friends and Family:   . Attends Religious Services:   . Active Member of Clubs or Organizations:   . Attends Archivist Meetings:   Marland Kitchen Marital Status:   Intimate Partner Violence:   . Fear of Current or Ex-Partner:   . Emotionally Abused:   Marland Kitchen Physically Abused:   . Sexually Abused:     FAMILY HISTORY:  Family History  Problem Relation Age of Onset  . Stomach cancer Mother        dx 82s  . Aneurysm Maternal Aunt        brain  . Alcohol abuse Maternal Uncle   . Stomach cancer Maternal Grandmother        dx 50s-60s  . Prostate cancer Maternal Uncle        dx 22s    CURRENT MEDICATIONS:  Current Outpatient Medications  Medication Sig Dispense Refill  . DOCEtaxel (TAXOTERE IV) Inject 75 mg/m2 into the vein every 21 ( twenty-one) days.     Marland Kitchen docusate sodium (COLACE) 100 MG capsule Take 2 capsules daily to help soften the stool.  It this does not work, you can increase to 2 capsules in the morning and 2 capsules in the evening.  If you do not have a bowel movement please call the [facility]. 30 capsule 1  . fentaNYL (DURAGESIC) 75 MCG/HR Place 1 patch onto the skin every 3 (three) days. 10 patch 0  . gabapentin (NEURONTIN) 300 MG capsule Take 1 capsule (300 mg total) by mouth at bedtime. 30 capsule 3  . predniSONE (DELTASONE) 5 MG tablet Take 1 tablet (5 mg total) by mouth daily with breakfast. 30 tablet 3  . Pseudoeph-Chlorphen-DM (CHILDRENS NYQUIL COLD/COUGH PO) Take 15 mLs by mouth at bedtime.    . silodosin (RAPAFLO) 8 MG CAPS capsule Take 1 capsule (8 mg total) by mouth daily with breakfast. 30 capsule 11  . tamsulosin (FLOMAX) 0.4 MG CAPS capsule Take 1 capsule by mouth at bedtime 30 capsule 4  . traZODone (DESYREL) 100 MG tablet Take 1 tablet (100 mg total) by  mouth at bedtime as needed for sleep. 30 tablet 3  . HYDROcodone-acetaminophen (NORCO) 10-325 MG tablet Take 1 tablet by mouth every 6 (six) hours as needed. (Patient not taking: Reported on 02/14/2020) 112 tablet 0  . NARCAN 4 MG/0.1ML LIQD nasal spray kit Place 1  spray into the nose as needed.  (Patient not taking: Reported on 02/14/2020)    . ondansetron (ZOFRAN) 8 MG tablet TAKE 1 TABLET BY MOUTH EVERY 8 HOURS AS NEEDED FOR NAUSEA FOR VOMITING (Patient not taking: Reported on 02/14/2020) 30 tablet 0  . prochlorperazine (COMPAZINE) 10 MG tablet Take 1 tablet (10 mg total) by mouth every 6 (six) hours as needed for nausea or vomiting. (Patient not taking: Reported on 02/14/2020) 30 tablet 1   No current facility-administered medications for this visit.   Facility-Administered Medications Ordered in Other Visits  Medication Dose Route Frequency Provider Last Rate Last Admin  . sodium chloride flush (NS) 0.9 % injection 10 mL  10 mL Intracatheter PRN Derek Jack, MD   10 mL at 01/18/19 0815    ALLERGIES:  Allergies  Allergen Reactions  . Meloxicam Other (See Comments)    Caused stomach bleeding.    PHYSICAL EXAM:  Performance status (ECOG): 1 - Symptomatic but completely ambulatory  Vitals:   02/14/20 0821  BP: 98/68  Pulse: 100  Resp: 18  Temp: (!) 97.3 F (36.3 C)  SpO2: 98%   Wt Readings from Last 3 Encounters:  02/14/20 217 lb 9.6 oz (98.7 kg)  01/24/20 224 lb 12.8 oz (102 kg)  01/03/20 220 lb 8 oz (100 kg)   Physical Exam Vitals reviewed.  Constitutional:      Appearance: Normal appearance.  Cardiovascular:     Rate and Rhythm: Normal rate and regular rhythm.     Pulses: Normal pulses.     Heart sounds: Normal heart sounds.  Pulmonary:     Effort: Pulmonary effort is normal.     Breath sounds: Normal breath sounds.  Musculoskeletal:     Right lower leg: Edema (trace) present.     Left lower leg: Edema (trace) present.     Comments: Port over right shoulder   Neurological:     General: No focal deficit present.     Mental Status: He is alert and oriented to person, place, and time.  Psychiatric:        Mood and Affect: Mood normal.        Behavior: Behavior normal.     LABORATORY DATA:  I have reviewed the labs as listed.  CBC Latest Ref Rng & Units 02/14/2020 01/24/2020 01/03/2020  WBC 4.0 - 10.5 K/uL 7.1 6.0 8.7  Hemoglobin 13.0 - 17.0 g/dL 11.3(L) 10.9(L) 11.7(L)  Hematocrit 39 - 52 % 35.6(L) 33.6(L) 35.6(L)  Platelets 150 - 400 K/uL 271 241 261   CMP Latest Ref Rng & Units 02/14/2020 01/24/2020 01/03/2020  Glucose 70 - 99 mg/dL 120(H) 101(H) 126(H)  BUN 8 - 23 mg/dL '8 10 13  ' Creatinine 0.61 - 1.24 mg/dL 1.05 0.96 1.10  Sodium 135 - 145 mmol/L 138 139 136  Potassium 3.5 - 5.1 mmol/L 3.4(L) 3.8 3.9  Chloride 98 - 111 mmol/L 103 105 99  CO2 22 - 32 mmol/L '24 27 25  ' Calcium 8.9 - 10.3 mg/dL 8.9 8.9 9.2  Total Protein 6.5 - 8.1 g/dL 6.3(L) 6.1(L) 6.9  Total Bilirubin 0.3 - 1.2 mg/dL 0.4 0.6 0.4  Alkaline Phos 38 - 126 U/L 115 89 102  AST 15 - 41 U/L 14(L) 11(L) 12(L)  ALT 0 - 44 U/L '9 12 10    ' DIAGNOSTIC IMAGING:  I have independently reviewed the scans and discussed with the patient. NM Bone Scan Whole Body  Result Date: 01/24/2020 CLINICAL DATA:  History of metastatic prostate cancer. Diffuse  back pain. EXAM: NUCLEAR MEDICINE WHOLE BODY BONE SCAN TECHNIQUE: Whole body anterior and posterior images were obtained approximately 3 hours after intravenous injection of radiopharmaceutical. RADIOPHARMACEUTICALS:  20.2 mCi Technetium-17mMDP IV COMPARISON:  11/10/2019 FINDINGS: Expected radiotracer activity is present in the urinary tract. Numerous foci of abnormal skeletal uptake are unchanged including 3 discrete foci of calvarial uptake and abnormal uptake in the upper cervical spine, lower thoracic spine (including prominent T10 and T12 vertebral involvement), lumbar spine (including prominent L3 involvement), multiple bilateral ribs, right  humeral shaft, and bilateral bony pelvis (including intense uptake in the left ilium). No definite new lesions are identified. Uptake at the shoulders and left greater than right knees is unchanged and likely degenerative. IMPRESSION: Unchanged osseous metastatic disease. Electronically Signed   By: ALogan BoresM.D.   On: 01/24/2020 08:39     ASSESSMENT:  1. 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 -Abiraterone/prednisone, cabazitaxel with progression. -Guardant 360 showed ATM mutation. -Olaparib 300 mg twice daily from 07/28/2019 through 11/03/2019 with progression. -Bone scan on 11/10/2019 showed worsening disease.  PSA was 132 on 11/21/2019. -CT scan on 08/11/2019 showed bone metastasis with no visceral metastasis. -Docetaxel 60 mg per metered square started on 11/21/2019.   PLAN:  1. 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  He reports dark urine.  I have done a UA in the office which did not show any infection.  He was told to drink plenty of fluids. -I have reviewed his labs today.  CBC is adequate to proceed with next cycle of docetaxel.  We will give docetaxel at 60 mg per metered squared.  LFTs are also normal except slightly low albumin. -Last PSA improved to 106 on 01/24/2020.  Will be seen back in 3 weeks for follow-up.  2. Bone Metastatic Disease: -Denosumab indefinitely held since 12/21/2018 secondary to ONJ.  3. Back Pain: -Continue fentanyl 75 mcg patch.  Continue hydrocodone 10 mg 3 to 4 tablets daily.  4. Mild Hypercalcemia: -Calcium is 8.9.  Albumin 3.3.  5. Elevated Creatinine: -Creatinine today improved to 1.05.  6.  Peripheral neuropathy: -He has neuropathy in the toes.  Continue gabapentin 300 mg nightly.   Orders placed this encounter:  No orders of the defined types were placed in this  encounter.    SDerek Jack MD [phone]  I, DMilinda Antis am acting as a scribe for Dr. SSanda Linger  I, SDerek JackMD, have reviewed the above documentation for accuracy and completeness, and I agree with the above.

## 2020-02-14 NOTE — Patient Instructions (Signed)
[facility] at [facility] Discharge Instructions  You were seen today by Dr. Katragadda. He went over your recent results. You received your treatment today. Dr. Katragadda will see you back in 3 weeks for labs and follow up.   Thank you for choosing [facility] at [facility] to provide your oncology and hematology care.  To afford each patient quality time with our provider, please arrive at least 15 minutes before your scheduled appointment time.   If you have a lab appointment with the Cancer Center please come in thru the Main Entrance and check in at the main information desk  You need to re-schedule your appointment should you arrive 10 or more minutes late.  We strive to give you quality time with our providers, and arriving late affects you and other patients whose appointments are after yours.  Also, if you no show three or more times for appointments you may be dismissed from the clinic at the providers discretion.     Again, thank you for choosing [facility].  Our hope is that these requests will decrease the amount of time that you wait before being seen by our physicians.       _____________________________________________________________  Should you have questions after your visit to [facility], please contact our office at [phone] between the hours of 8:00 a.m. and 4:30 p.m.  Voicemails left after 4:00 p.m. will not be returned until the following business day.  For prescription refill requests, have your pharmacy contact our office and allow 72 hours.    Cancer Center Support Programs:   > Cancer Support Group  2nd Tuesday of the month 1pm-2pm, Journey Room    

## 2020-02-14 NOTE — Progress Notes (Signed)
Patient has been assessed, vital signs and labs have been reviewed by Dr. Katragadda. ANC, Creatinine, LFTs, and Platelets are within treatment parameters per Dr. Katragadda. The patient is good to proceed with treatment at this time.  

## 2020-02-22 ENCOUNTER — Other Ambulatory Visit (HOSPITAL_COMMUNITY): Payer: Self-pay | Admitting: *Deleted

## 2020-02-22 DIAGNOSIS — G893 Neoplasm related pain (acute) (chronic): Secondary | ICD-10-CM

## 2020-02-22 DIAGNOSIS — C61 Malignant neoplasm of prostate: Secondary | ICD-10-CM

## 2020-02-22 MED ORDER — FENTANYL 75 MCG/HR TD PT72: 1.0000 | MEDICATED_PATCH | TRANSDERMAL | 0 refills | Status: DC

## 2020-03-06 ENCOUNTER — Inpatient Hospital Stay (HOSPITAL_COMMUNITY): Payer: Medicare Other

## 2020-03-06 ENCOUNTER — Encounter (HOSPITAL_COMMUNITY): Payer: Self-pay

## 2020-03-06 ENCOUNTER — Inpatient Hospital Stay (HOSPITAL_COMMUNITY): Payer: Medicare Other | Attending: Hematology | Admitting: Hematology

## 2020-03-06 ENCOUNTER — Other Ambulatory Visit: Payer: Self-pay

## 2020-03-06 VITALS — BP 105/72 | HR 99 | Temp 97.3°F | Resp 18

## 2020-03-06 VITALS — BP 100/70 | HR 87 | Resp 18

## 2020-03-06 DIAGNOSIS — C7951 Secondary malignant neoplasm of bone: Secondary | ICD-10-CM | POA: Diagnosis not present

## 2020-03-06 DIAGNOSIS — G893 Neoplasm related pain (acute) (chronic): Secondary | ICD-10-CM

## 2020-03-06 DIAGNOSIS — Z5189 Encounter for other specified aftercare: Secondary | ICD-10-CM | POA: Diagnosis not present

## 2020-03-06 DIAGNOSIS — C61 Malignant neoplasm of prostate: Secondary | ICD-10-CM

## 2020-03-06 DIAGNOSIS — Z5111 Encounter for antineoplastic chemotherapy: Secondary | ICD-10-CM | POA: Insufficient documentation

## 2020-03-06 LAB — COMPREHENSIVE METABOLIC PANEL
ALT: 9 U/L (ref 0–44)
AST: 12 U/L — ABNORMAL LOW (ref 15–41)
Albumin: 3.6 g/dL (ref 3.5–5.0)
Alkaline Phosphatase: 114 U/L (ref 38–126)
Anion gap: 10 (ref 5–15)
BUN: 9 mg/dL (ref 8–23)
CO2: 25 mmol/L (ref 22–32)
Calcium: 8.7 mg/dL — ABNORMAL LOW (ref 8.9–10.3)
Chloride: 102 mmol/L (ref 98–111)
Creatinine, Ser: 0.97 mg/dL (ref 0.61–1.24)
GFR calc Af Amer: >60 mL/min (ref >60)
GFR calc non Af Amer: >60 mL/min (ref >60)
Glucose, Bld: 178 mg/dL — ABNORMAL HIGH (ref 70–99)
Potassium: 4 mmol/L (ref 3.5–5.1)
Sodium: 137 mmol/L (ref 135–145)
Total Bilirubin: 0.4 mg/dL (ref 0.3–1.2)
Total Protein: 6.6 g/dL (ref 6.5–8.1)

## 2020-03-06 LAB — CBC WITH DIFFERENTIAL/PLATELET
Abs Immature Granulocytes: 0.04 10*3/uL (ref 0.00–0.07)
Basophils Absolute: 0 10*3/uL (ref 0.0–0.1)
Basophils Relative: 0 %
Eosinophils Absolute: 0 10*3/uL (ref 0.0–0.5)
Eosinophils Relative: 0 %
HCT: 36.4 % — ABNORMAL LOW (ref 39.0–52.0)
Hemoglobin: 11.6 g/dL — ABNORMAL LOW (ref 13.0–17.0)
Immature Granulocytes: 0 %
Lymphocytes Relative: 6 %
Lymphs Abs: 0.6 10*3/uL — ABNORMAL LOW (ref 0.7–4.0)
MCH: 31.2 pg (ref 26.0–34.0)
MCHC: 31.9 g/dL (ref 30.0–36.0)
MCV: 97.8 fL (ref 80.0–100.0)
Monocytes Absolute: 0.4 10*3/uL (ref 0.1–1.0)
Monocytes Relative: 4 %
Neutro Abs: 9.4 10*3/uL — ABNORMAL HIGH (ref 1.7–7.7)
Neutrophils Relative %: 90 %
Platelets: 248 10*3/uL (ref 150–400)
RBC: 3.72 MIL/uL — ABNORMAL LOW (ref 4.22–5.81)
RDW: 17 % — ABNORMAL HIGH (ref 11.5–15.5)
WBC: 10.5 10*3/uL (ref 4.0–10.5)
nRBC: 0 % (ref 0.0–0.2)

## 2020-03-06 LAB — MAGNESIUM: Magnesium: 2 mg/dL (ref 1.7–2.4)

## 2020-03-06 LAB — PSA: Prostatic Specific Antigen: 89.03 ng/mL — ABNORMAL HIGH (ref 0.00–4.00)

## 2020-03-06 MED ORDER — SODIUM CHLORIDE 0.9% FLUSH: 10.0000 mL | INTRAVENOUS | Status: DC | PRN

## 2020-03-06 MED ORDER — SODIUM CHLORIDE 0.9 % IV SOLN: Freq: Once | INTRAVENOUS | Status: AC

## 2020-03-06 MED ORDER — SODIUM CHLORIDE 0.9 % IV SOLN
60.0000 mg/m2 | Freq: Once | INTRAVENOUS | Status: AC
  Administered 2020-03-06: 140 mg via INTRAVENOUS
  Filled 2020-03-06: qty 14

## 2020-03-06 MED ORDER — PEGFILGRASTIM 6 MG/0.6ML ~~LOC~~ PSKT
6.0000 mg | PREFILLED_SYRINGE | Freq: Once | SUBCUTANEOUS | Status: AC
  Administered 2020-03-06: 6 mg via SUBCUTANEOUS
  Filled 2020-03-06: qty 0.6

## 2020-03-06 MED ORDER — SODIUM CHLORIDE 0.9 % IV SOLN
10.0000 mg | Freq: Once | INTRAVENOUS | Status: AC
  Administered 2020-03-06: 10 mg via INTRAVENOUS
  Filled 2020-03-06: qty 10

## 2020-03-06 MED ORDER — PALONOSETRON HCL INJECTION 0.25 MG/5ML
0.2500 mg | Freq: Once | INTRAVENOUS | Status: AC
  Administered 2020-03-06: 0.25 mg via INTRAVENOUS
  Filled 2020-03-06: qty 5

## 2020-03-06 MED ORDER — HYDROCODONE-ACETAMINOPHEN 10-325 MG PO TABS: 1.0000 | ORAL_TABLET | Freq: Four times a day (QID) | ORAL | 0 refills | Status: DC | PRN

## 2020-03-06 MED ORDER — HEPARIN SOD (PORK) LOCK FLUSH 100 UNIT/ML IV SOLN
500.0000 [IU] | Freq: Once | INTRAVENOUS | Status: AC | PRN
  Administered 2020-03-06: 500 [IU]

## 2020-03-06 NOTE — Patient Instructions (Signed)
[facility] Discharge Instructions for Patients Receiving Chemotherapy  Today you received the following chemotherapy agents   To help prevent nausea and vomiting after your treatment, we encourage you to take your nausea medication   If you develop nausea and vomiting that is not controlled by your nausea medication, call the clinic.   BELOW ARE SYMPTOMS THAT SHOULD BE REPORTED IMMEDIATELY:  *FEVER GREATER THAN 100.5 F  *CHILLS WITH OR WITHOUT FEVER  NAUSEA AND VOMITING THAT IS NOT CONTROLLED WITH YOUR NAUSEA MEDICATION  *UNUSUAL SHORTNESS OF BREATH  *UNUSUAL BRUISING OR BLEEDING  TENDERNESS IN MOUTH AND THROAT WITH OR WITHOUT PRESENCE OF ULCERS  *URINARY PROBLEMS  *BOWEL PROBLEMS  UNUSUAL RASH Items with * indicate a potential emergency and should be followed up as soon as possible.  Feel free to call the clinic should you have any questions or concerns. The clinic phone number is [phone].  Please show the CHEMO ALERT CARD at check-in to the Emergency Department and triage nurse.   

## 2020-03-06 NOTE — Progress Notes (Signed)
Patient has been assessed, vital signs and labs have been reviewed by Dr. Katragadda. ANC, Creatinine, LFTs, and Platelets are within treatment parameters per Dr. Katragadda. The patient is good to proceed with treatment at this time.  

## 2020-03-06 NOTE — Patient Instructions (Signed)
[facility] at [facility] Discharge Instructions  You were seen today by Dr. Katragadda. He went over your recent results. You received your treatment today. Dr. Katragadda will see you back in 3 weeks for labs and follow up.   Thank you for choosing [facility] at [facility] to provide your oncology and hematology care.  To afford each patient quality time with our provider, please arrive at least 15 minutes before your scheduled appointment time.   If you have a lab appointment with the Cancer Center please come in thru the Main Entrance and check in at the main information desk  You need to re-schedule your appointment should you arrive 10 or more minutes late.  We strive to give you quality time with our providers, and arriving late affects you and other patients whose appointments are after yours.  Also, if you no show three or more times for appointments you may be dismissed from the clinic at the providers discretion.     Again, thank you for choosing [facility].  Our hope is that these requests will decrease the amount of time that you wait before being seen by our physicians.       _____________________________________________________________  Should you have questions after your visit to [facility], please contact our office at [phone] between the hours of 8:00 a.m. and 4:30 p.m.  Voicemails left after 4:00 p.m. will not be returned until the following business day.  For prescription refill requests, have your pharmacy contact our office and allow 72 hours.    Cancer Center Support Programs:   > Cancer Support Group  2nd Tuesday of the month 1pm-2pm, Journey Room    

## 2020-03-06 NOTE — Progress Notes (Signed)
Labs are within parameters to proceed with treatment.   Matthew [name] tolerated treatment well today without incidence. Vital signs WNL prior to discharge. Discharge ambulatory.

## 2020-03-06 NOTE — Progress Notes (Signed)
Matthew [name], [place] [address]   CLINIC:  Medical Oncology/Hematology  PCP:  [name], [place] / [place] [address] [phone]   REASON FOR VISIT:  Follow-up for metastatic prostate cancer to bone  PRIOR THERAPY:  1. Docetaxel from 01/07/2016 to 04/21/2016 2. Olaparib from 07/28/2019 to 11/03/2019 with progression  NGS Results: Not done  CURRENT THERAPY: Docetaxel  BRIEF ONCOLOGIC HISTORY:  Oncology History  Prostate carcinoma ([place])  12/10/2015 Imaging   CTA chest, multiple thoracic and rib osseous lesions. no primary evident. Atherosclerosis including aortic and CAD   12/12/2015 Imaging   CT abdomen/pelvis with sclerotic osseous lesions througout the lumbar spine, sacrum, bilateral iliac bones and L proximal femur worrisome for sclerotic osseous mets,mild prostatomegaly, no LAD, Infrarenal 3.8 cm AAA   12/12/2015 Tumor Marker   PSA 153.85   12/18/2015 Imaging   Bone Scan Diffuse bony metastatic disease, posterior calvarium, sternum, thoracic, lumbar spine, bilateral ribs, L shoulder, bilateral bony pelvis, proximal L femur   12/24/2015 Initial Biopsy   CT biopsy of L iliac bone lesion   12/26/2015 Pathology Results   Bone, biopsy, left iliac - POSITIVE FOR METASTATIC ADENOCARCINOMA.   12/28/2015 - 09/01/2016 Chemotherapy   Firmagon instituted 240 mg    01/01/2016 Procedure   Port placed by IR.   01/07/2016 - 04/11/2016 Chemotherapy   Docetaxel every 21 days x 6 cycles with dose reductions due to tolerance   02/18/2016 Adverse Reaction   GI toxicity from docetaxel, dose reduced to 60 mg/m2   02/18/2016 Treatment Plan Change   Docetaxel dose reduced by 20%   03/06/2016 Procedure   Dr. Enrique Sack- Multiple extraction of tooth numbers 3, 5, 6, 8, 9, 10, 11, 12, 20, 21, 22, 27, 28, and 31. 4 Quadrants of alveoloplasty. Bilateral mandibular lingual tori reductions.   04/09/2016 Imaging   Brain MRI 1. No intracranial  metastatic disease 2. No acute intracranial abnormality 3. Findings of chronic microvascular disease   05/14/2016 Imaging   CT C/A/P Stable appearing diffuse bony metastatic disease. No new significant disease is seen Innumerable patchy sclerotic osseous metastases throughout the axial and proximal appendicular skeleton, stable in size and distribution, significantly increased in density in the interval, likely reflecting treatment effect. 2. No new or progressive metastatic disease in the chest, abdomen or pelvis. 3. Bilateral perifissural pulmonary nodules are stable and probably benign. 4. Aortic atherosclerosis. Infrarenal 4.3 cm abdominal aortic aneurysm, minimally increased in size. Recommend followup by ultrasound in 1 year.   06/02/2016 Tumor Marker   PSA 0.99 ng/ml    - 07/31/2016 Radiation Therapy   Palliative XRT by Dr. Lianne Cure in [place].    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  The Multi-Gene Panel offered by [facility] includes sequencing and/or deletion duplication testing of the following 85 genes: AIP, ALK, APC, ATM, AXIN2,BAP1,  BARD1, BLM, BMPR1A, BRCA1, BRCA2, BRIP1, CASR, CDC73, CDH1, CDK4, CDKN1B, CDKN1C, CDKN2A (p14ARF), CDKN2A (p16INK4a), CEBPA, CHEK2, CTNNA1, DICER1, DIS3L2, EGFR (c.2369C>T, p.Thr790Met variant only), EPCAM (Deletion/duplication testing only), FH, FLCN, GATA2, GPC3, GREM1 (Promoter region deletion/duplication testing only), HOXB13 (c.251G>A, p.Gly84Glu), HRAS, KIT, MAX, MEN1, MET, MITF (c.952G>A, p.Glu318Lys variant only), MLH1, MSH2, MSH3, MSH6, MUTYH, NBN, NF1, NF2, NTHL1, PALB2, PDGFRA, PHOX2B, PMS2, POLD1, POLE, POT1, PRKAR1A, PTCH1, PTEN, RAD50, RAD51C, RAD51D, RB1, RECQL4, RET, RNF43, RUNX1, SDHAF2, SDHA (sequence changes  only), SDHB, SDHC, SDHD, SMAD4, SMARCA4, SMARCB1, SMARCE1, STK11, SUFU, TERC, TERT, TMEM127,  TP53, TSC1, TSC2, VHL, WRN and WT1.  The report date is August 24, 2018  This is not thought to increase the risk for colon cancer or other cancers, unless there is the presence of two pathogenic mutations.   09/27/2018 - 06/27/2019 Chemotherapy   The patient had palonosetron (ALOXI) injection 0.25 mg, 0.25 mg, Intravenous,  Once, 10 of 10 cycles Administration: 0.25 mg (09/27/2018), 0.25 mg (10/25/2018), 0.25 mg (11/23/2018), 0.25 mg (12/21/2018), 0.25 mg (01/18/2019), 0.25 mg (02/15/2019), 0.25 mg (03/15/2019), 0.25 mg (04/12/2019), 0.25 mg (05/10/2019), 0.25 mg (06/07/2019) pegfilgrastim (NEULASTA ONPRO KIT) injection 6 mg, 6 mg, Subcutaneous, Once, 9 of 9 cycles Administration: 6 mg (10/25/2018), 6 mg (11/23/2018), 6 mg (12/21/2018), 6 mg (01/18/2019), 6 mg (02/15/2019), 6 mg (03/15/2019), 6 mg (04/12/2019), 6 mg (05/10/2019), 6 mg (06/07/2019) pegfilgrastim-cbqv (UDENYCA) injection 6 mg, 6 mg, Subcutaneous, Once, 1 of 1 cycle Administration: 6 mg (09/29/2018) cabazitaxel (JEVTANA) 35 mg in dextrose 5 % 250 mL chemo infusion, 15 mg/m2 = 35 mg (100 % of original dose 15 mg/m2), Intravenous,  Once, 10 of 10 cycles Dose modification: 15 mg/m2 (original dose 15 mg/m2, Cycle 1, Reason: Provider Judgment), 20 mg/m2 (original dose 15 mg/m2, Cycle 2, Reason: Provider Judgment), 20 mg/m2 (original dose 20 mg/m2, Cycle 5, Reason: Patient Age, Comment: Change in jevtana orderable), 15 mg/m2 (75 % of original dose 20 mg/m2, Cycle 8, Reason: Other (see comments), Comment: nausea, worsening numbness) Administration: 35 mg (09/27/2018), 46 mg (10/25/2018), 46 mg (11/23/2018), 46 mg (12/21/2018), 46 mg (01/18/2019), 46 mg (02/15/2019), 46 mg (03/15/2019), 35 mg (04/12/2019), 35 mg (05/10/2019), 35 mg (06/07/2019)  for chemotherapy treatment.    11/21/2019 -  Chemotherapy   The patient had palonosetron (ALOXI) injection 0.25 mg, 0.25 mg, Intravenous,  Once, 6 of 8 cycles Administration: 0.25 mg (11/21/2019), 0.25 mg (12/12/2019), 0.25 mg  (01/03/2020), 0.25 mg (01/24/2020), 0.25 mg (02/14/2020) pegfilgrastim (NEULASTA ONPRO KIT) injection 6 mg, 6 mg, Subcutaneous, Once, 6 of 8 cycles Administration: 6 mg (11/21/2019), 6 mg (12/12/2019), 6 mg (01/03/2020), 6 mg (01/24/2020), 6 mg (02/14/2020) DOCEtaxel (TAXOTERE) 140 mg in sodium chloride 0.9 % 250 mL chemo infusion, 60 mg/m2 = 140 mg (80 % of original dose 75 mg/m2), Intravenous,  Once, 6 of 8 cycles Dose modification: 60 mg/m2 (80 % of original dose 75 mg/m2, Cycle 1, Reason: Other (see comments), Comment: based on previous tolerabiliity in 2017) Administration: 140 mg (11/21/2019), 140 mg (12/12/2019), 140 mg (01/03/2020), 140 mg (01/24/2020), 140 mg (02/14/2020)  for chemotherapy treatment.    Bone metastases ([place])  12/26/2015 Initial Diagnosis   Bone metastases ([place])   09/27/2018 - 06/27/2019 Chemotherapy   The patient had palonosetron (ALOXI) injection 0.25 mg, 0.25 mg, Intravenous,  Once, 4 of 6 cycles Administration: 0.25 mg (09/27/2018), 0.25 mg (10/25/2018), 0.25 mg (11/23/2018), 0.25 mg (12/21/2018) pegfilgrastim (NEULASTA ONPRO KIT) injection 6 mg, 6 mg, Subcutaneous, Once, 3 of 5 cycles Administration: 6 mg (10/25/2018), 6 mg (11/23/2018), 6 mg (12/21/2018) pegfilgrastim-cbqv (UDENYCA) injection 6 mg, 6 mg, Subcutaneous, Once, 1 of 1 cycle Administration: 6 mg (09/29/2018) cabazitaxel (JEVTANA) 35 mg in dextrose 5 % 250 mL chemo infusion, 15 mg/m2 = 35 mg (100 % of original dose 15 mg/m2), Intravenous,  Once, 4 of 6 cycles Dose modification: 15 mg/m2 (original dose 15 mg/m2, Cycle 1, Reason: Provider Judgment), 20 mg/m2 (original dose 15 mg/m2, Cycle 2, Reason: Provider Judgment) Administration: 35 mg (09/27/2018), 46 mg (  10/25/2018), 46 mg (11/23/2018), 46 mg (12/21/2018)  for chemotherapy treatment.    11/21/2019 -  Chemotherapy   The patient had palonosetron (ALOXI) injection 0.25 mg, 0.25 mg, Intravenous,  Once, 6 of 8 cycles Administration: 0.25 mg (11/21/2019), 0.25 mg (12/12/2019), 0.25  mg (01/03/2020), 0.25 mg (01/24/2020), 0.25 mg (02/14/2020) pegfilgrastim (NEULASTA ONPRO KIT) injection 6 mg, 6 mg, Subcutaneous, Once, 6 of 8 cycles Administration: 6 mg (11/21/2019), 6 mg (12/12/2019), 6 mg (01/03/2020), 6 mg (01/24/2020), 6 mg (02/14/2020) DOCEtaxel (TAXOTERE) 140 mg in sodium chloride 0.9 % 250 mL chemo infusion, 60 mg/m2 = 140 mg (80 % of original dose 75 mg/m2), Intravenous,  Once, 6 of 8 cycles Dose modification: 60 mg/m2 (80 % of original dose 75 mg/m2, Cycle 1, Reason: Other (see comments), Comment: based on previous tolerabiliity in 2017) Administration: 140 mg (11/21/2019), 140 mg (12/12/2019), 140 mg (01/03/2020), 140 mg (01/24/2020), 140 mg (02/14/2020)  for chemotherapy treatment.      CANCER STAGING: Cancer Staging Prostate carcinoma [facility]) Staging form: Prostate, AJCC 7th Edition - Clinical stage from 12/24/2015: Stage IV (TX, N0, M1b, PSA: 20 or greater) - Signed by Baird Cancer, PA-C on 01/07/2016   INTERVAL HISTORY:  Mr. SAYID MOLL, a 64 y.o. male, returns for routine follow-up and consideration for next cycle of chemotherapy. Orest was last seen on 02/14/2020.  Due for cycle #6 of docetaxel today.   Overall, today he tells me he has been feeling pretty well. He continues using the Fentanyl patches and Norco 3-5 tablets per day. His appetite is okay. He tolerated the previous treatment well. He continues complaining of chronic painful urination, especially in the morning, and he continues having trouble swallowing liquids which can cause nausea and vomiting.  Overall, he feels ready for next cycle of chemo today.    REVIEW OF SYSTEMS:  Review of Systems  Constitutional: Positive for appetite change (moderately decreased) and fatigue (severe).  HENT:   Positive for trouble swallowing (liquids).   Gastrointestinal: Positive for constipation.  Genitourinary: Positive for dysuria.   Musculoskeletal: Positive for back pain (3/10 back & L hip pain).    Neurological: Positive for dizziness and numbness (fingers & toes).  Psychiatric/Behavioral: Positive for sleep disturbance.  All other systems reviewed and are negative.   PAST MEDICAL/SURGICAL HISTORY:  Past Medical History:  Diagnosis Date  . Arthritis    Left knee  . Bone cancer ([place])   . Bone metastases ([place]) 12/26/2015  . Cancer [facility])    Prostate  . DVT (deep venous thrombosis) ([place]) 2004  . Family history of prostate cancer   . Family history of stomach cancer   . Kidney stones    ~2002  . Prostate carcinoma ([place]) 12/26/2015   Past Surgical History:  Procedure Laterality Date  . CIRCUMCISION  30 years ago  . MULTIPLE EXTRACTIONS WITH ALVEOLOPLASTY N/A 03/06/2016   Procedure: Extraction of tooth #'s 3,5,6,8-12,20-22, 27, 28, and 31 with alveoloplasty and bilateral mandibular tori reductions;  Surgeon: Lenn Cal, DDS;  Location: [place];  Service: Oral Surgery;  Laterality: N/A;    SOCIAL HISTORY:  Social History   Socioeconomic History  . Marital status: Single    Spouse name: Not on file  . Number of children: 2  . Years of education: Not on file  . Highest education level: Not on file  Occupational History  . Occupation: Construction/carpenter  Tobacco Use  . Smoking status: Current Every Day Smoker    Packs/day: 1.00  Years: 50.00    Pack years: 50.00    Types: Cigarettes    Start date: 06/16/1965  . Smokeless tobacco: Former Systems developer    Types: [place] date: 08/04/2005  Substance and Sexual Activity  . Alcohol use: No    Alcohol/week: 0.0 standard drinks  . Drug use: Yes    Types: Marijuana    Comment: THC for appetite; occ  . Sexual activity: Not on file  Other Topics Concern  . Not on file  Social History Narrative  . Not on file   Social Determinants of Health   Financial Resource Strain:   . Difficulty of Paying Living Expenses:   Food Insecurity:   . Worried About Charity fundraiser in the Last Year:   . Arboriculturist in the  Last Year:   Transportation Needs:   . Film/video editor (Medical):   Marland Kitchen Lack of Transportation (Non-Medical):   Physical Activity:   . Days of Exercise per Week:   . Minutes of Exercise per Session:   Stress:   . Feeling of Stress :   Social Connections:   . Frequency of Communication with Friends and Family:   . Frequency of Social Gatherings with Friends and Family:   . Attends Religious Services:   . Active Member of Clubs or Organizations:   . Attends Archivist Meetings:   Marland Kitchen Marital Status:   Intimate Partner Violence:   . Fear of Current or Ex-Partner:   . Emotionally Abused:   Marland Kitchen Physically Abused:   . Sexually Abused:     FAMILY HISTORY:  Family History  Problem Relation Age of Onset  . Stomach cancer Mother        dx 48s  . Aneurysm Maternal Aunt        brain  . Alcohol abuse Maternal Uncle   . Stomach cancer Maternal Grandmother        dx 50s-60s  . Prostate cancer Maternal Uncle        dx 29s    CURRENT MEDICATIONS:  Current Outpatient Medications  Medication Sig Dispense Refill  . DOCEtaxel (TAXOTERE IV) Inject 75 mg/m2 into the vein every 21 ( twenty-one) days.     Marland Kitchen docusate sodium (COLACE) 100 MG capsule Take 2 capsules daily to help soften the stool.  It this does not work, you can increase to 2 capsules in the morning and 2 capsules in the evening.  If you do not have a bowel movement please call the [facility]. 30 capsule 1  . fentaNYL (DURAGESIC) 75 MCG/HR Place 1 patch onto the skin every 3 (three) days. 10 patch 0  . gabapentin (NEURONTIN) 300 MG capsule Take 1 capsule (300 mg total) by mouth at bedtime. 30 capsule 3  . HYDROcodone-acetaminophen (NORCO) 10-325 MG tablet Take 1 tablet by mouth every 6 (six) hours as needed. (Patient not taking: Reported on 02/14/2020) 112 tablet 0  . NARCAN 4 MG/0.1ML LIQD nasal spray kit Place 1 spray into the nose as needed.  (Patient not taking: Reported on 02/14/2020)    . ondansetron  (ZOFRAN) 8 MG tablet Take every 8 hours as needed 30 tablet 6  . predniSONE (DELTASONE) 5 MG tablet Take 1 tablet (5 mg total) by mouth daily with breakfast. 30 tablet 3  . prochlorperazine (COMPAZINE) 10 MG tablet Take 1 tablet (10 mg total) by mouth every 6 (six) hours as needed for nausea or vomiting. 60 tablet 6  .  Pseudoeph-Chlorphen-DM (CHILDRENS NYQUIL COLD/COUGH PO) Take 15 mLs by mouth at bedtime.    . silodosin (RAPAFLO) 8 MG CAPS capsule Take 1 capsule (8 mg total) by mouth daily with breakfast. 30 capsule 11  . tamsulosin (FLOMAX) 0.4 MG CAPS capsule Take 1 capsule by mouth at bedtime 30 capsule 4  . traZODone (DESYREL) 100 MG tablet Take 1 tablet (100 mg total) by mouth at bedtime as needed for sleep. 30 tablet 3   No current facility-administered medications for this visit.   Facility-Administered Medications Ordered in Other Visits  Medication Dose Route Frequency Provider Last Rate Last Admin  . 0.9 %  sodium chloride infusion   Intravenous Once Derek Jack, MD      . dexamethasone (DECADRON) 10 mg in sodium chloride 0.9 % 50 mL IVPB  10 mg Intravenous Once Derek Jack, MD      . DOCEtaxel (TAXOTERE) 140 mg in sodium chloride 0.9 % 250 mL chemo infusion  60 mg/m2 (Treatment Plan Recorded) Intravenous Once Derek Jack, MD      . heparin lock flush 100 unit/mL  500 Units Intracatheter Once PRN Derek Jack, MD      . palonosetron (ALOXI) injection 0.25 mg  0.25 mg Intravenous Once Derek Jack, MD      . pegfilgrastim (NEULASTA ONPRO KIT) injection 6 mg  6 mg Subcutaneous Once Derek Jack, MD      . sodium chloride flush (NS) 0.9 % injection 10 mL  10 mL Intracatheter PRN Derek Jack, MD   10 mL at 01/18/19 0815  . sodium chloride flush (NS) 0.9 % injection 10 mL  10 mL Intracatheter PRN Derek Jack, MD        ALLERGIES:  Allergies  Allergen Reactions  . Meloxicam Other (See Comments)    Caused stomach  bleeding.    PHYSICAL EXAM:  Performance status (ECOG): 1 - Symptomatic but completely ambulatory  Vitals:   03/06/20 0955  BP: 105/72  Pulse: 99  Resp: 18  Temp: (!) 97.3 F (36.3 C)  SpO2: 97%   Wt Readings from Last 3 Encounters:  02/14/20 217 lb 9.6 oz (98.7 kg)  01/24/20 224 lb 12.8 oz (102 kg)  01/03/20 220 lb 8 oz (100 kg)   Physical Exam Vitals reviewed.  Constitutional:      Appearance: Normal appearance. He is obese.  Cardiovascular:     Rate and Rhythm: Normal rate and regular rhythm.     Pulses: Normal pulses.     Heart sounds: Normal heart sounds.  Pulmonary:     Effort: Pulmonary effort is normal.     Breath sounds: Normal breath sounds.  Chest:     Comments: Port-a-Cath on R chest Abdominal:     Palpations: Abdomen is soft.     Tenderness: There is no abdominal tenderness.  Musculoskeletal:     Right lower leg: No edema.     Left lower leg: No edema.  Neurological:     General: No focal deficit present.     Mental Status: He is alert and oriented to person, place, and time.  Psychiatric:        Mood and Affect: Mood normal.        Behavior: Behavior normal.     LABORATORY DATA:  I have reviewed the labs as listed.  CBC Latest Ref Rng & Units 03/06/2020 02/14/2020 01/24/2020  WBC 4.0 - 10.5 K/uL 10.5 7.1 6.0  Hemoglobin 13.0 - 17.0 g/dL 11.6(L) 11.3(L) 10.9(L)  Hematocrit 39 - 52 %  36.4(L) 35.6(L) 33.6(L)  Platelets 150 - 400 K/uL 248 271 241   CMP Latest Ref Rng & Units 03/06/2020 02/14/2020 01/24/2020  Glucose 70 - 99 mg/dL 178(H) 120(H) 101(H)  BUN 8 - 23 mg/dL _0 Creatinine 0.61 - 1.24 mg/dL 0.97 1.05 0.96  Sodium 135 - 145 mmol/L 137 138 139  Potassium 3.5 - 5.1 mmol/L 4.0 3.4(L) 3.8  Chloride 98 - 111 mmol/L 102 103 105  CO2 22 - 32 mmol/L _1 Calcium 8.9 - 10.3 mg/dL 8.7(L) 8.9 8.9  Total Protein 6.5 - 8.1 g/dL 6.6 6.3(L) 6.1(L)  Total Bilirubin 0.3 - 1.2 mg/dL 0.4 0.4 0.6  Alkaline Phos 38 - 126 U/L 114 115 89  AST 15 - 41  U/L 12(L) 14(L) 11(L)  ALT 0 - 44 U/L _2 Lab Results  Component Value Date   PSA 0.20 12/10/2016   PSA 0.28 09/29/2016   PSA 0.43 09/01/2016    DIAGNOSTIC IMAGING:  I have independently reviewed the scans and discussed with the patient. No results found.   ASSESSMENT:  1. 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 -Abiraterone/prednisone, cabazitaxel with progression. -Guardant 360 showed ATM mutation. -Olaparib 300 mg twice daily from 07/28/2019 through 11/03/2019 with progression. -Bone scan on 11/10/2019 showed worsening disease. PSA was 132 on 11/21/2019. -CT scan on 08/11/2019 showed bone metastasis with no visceral metastasis. -Docetaxel 60 mg per metered square started on 11/21/2019. -Bone scan on 01/23/2020 showed stable osseous metastatic disease. -PSA on 03/06/2020 improved to 89.   PLAN:  1. 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  He is tolerating docetaxel reasonably well. -I have reviewed his LFTs and CBC which are adequate to proceed with next cycle today. -We will reevaluate him in 3 weeks for his next chemo treatment.  I plan to repeat scans in 3 months from the last.  2. Bone Metastatic Disease: -Denosumab indefinitely held since 12/21/2018 secondary to ONJ.  3. Back Pain: -Continue fentanyl 75 mcg patch.  He is taking hydrocodone 10 mg every 5-6 hours which is helping pain.  4. Mild Hypercalcemia: -Calcium today is 8.7 with albumin of 3.6.  5. Elevated Creatinine: -Creatinine today improved to 0.97.  6.  Peripheral neuropathy: -Continue gabapentin 300 mg at bedtime.   Orders placed this encounter:  No orders of the defined types were placed in this encounter.    Derek Jack, MD [place] [phone]   I, Milinda Antis, am acting as a scribe for Dr. Sanda Linger.  I, Derek Jack MD, have reviewed the above documentation for accuracy and completeness, and I agree with the above.

## 2020-03-25 ENCOUNTER — Other Ambulatory Visit (HOSPITAL_COMMUNITY): Payer: Self-pay | Admitting: Hematology

## 2020-03-25 DIAGNOSIS — K219 Gastro-esophageal reflux disease without esophagitis: Secondary | ICD-10-CM

## 2020-03-27 ENCOUNTER — Inpatient Hospital Stay (HOSPITAL_COMMUNITY): Payer: Medicare Other

## 2020-03-27 ENCOUNTER — Encounter (HOSPITAL_COMMUNITY): Payer: Self-pay | Admitting: Hematology

## 2020-03-27 ENCOUNTER — Inpatient Hospital Stay (HOSPITAL_COMMUNITY): Payer: Medicare Other | Admitting: Hematology

## 2020-03-27 ENCOUNTER — Other Ambulatory Visit (HOSPITAL_COMMUNITY): Payer: Self-pay | Admitting: *Deleted

## 2020-03-27 ENCOUNTER — Other Ambulatory Visit: Payer: Self-pay

## 2020-03-27 VITALS — BP 97/55 | HR 77 | Resp 18

## 2020-03-27 VITALS — BP 97/65 | HR 95 | Temp 97.2°F | Resp 18 | Wt 213.8 lb

## 2020-03-27 DIAGNOSIS — C61 Malignant neoplasm of prostate: Secondary | ICD-10-CM | POA: Diagnosis not present

## 2020-03-27 DIAGNOSIS — C7951 Secondary malignant neoplasm of bone: Secondary | ICD-10-CM | POA: Diagnosis not present

## 2020-03-27 DIAGNOSIS — G893 Neoplasm related pain (acute) (chronic): Secondary | ICD-10-CM

## 2020-03-27 DIAGNOSIS — Z5189 Encounter for other specified aftercare: Secondary | ICD-10-CM | POA: Diagnosis not present

## 2020-03-27 DIAGNOSIS — Z5111 Encounter for antineoplastic chemotherapy: Secondary | ICD-10-CM | POA: Diagnosis not present

## 2020-03-27 LAB — CBC WITH DIFFERENTIAL/PLATELET
Abs Immature Granulocytes: 0.04 K/uL (ref 0.00–0.07)
Basophils Absolute: 0 K/uL (ref 0.0–0.1)
Basophils Relative: 1 %
Eosinophils Absolute: 0.1 K/uL (ref 0.0–0.5)
Eosinophils Relative: 1 %
HCT: 36.6 % — ABNORMAL LOW (ref 39.0–52.0)
Hemoglobin: 11.8 g/dL — ABNORMAL LOW (ref 13.0–17.0)
Immature Granulocytes: 1 %
Lymphocytes Relative: 8 %
Lymphs Abs: 0.7 K/uL (ref 0.7–4.0)
MCH: 31.5 pg (ref 26.0–34.0)
MCHC: 32.2 g/dL (ref 30.0–36.0)
MCV: 97.6 fL (ref 80.0–100.0)
Monocytes Absolute: 0.6 K/uL (ref 0.1–1.0)
Monocytes Relative: 7 %
Neutro Abs: 7 K/uL (ref 1.7–7.7)
Neutrophils Relative %: 82 %
Platelets: 220 K/uL (ref 150–400)
RBC: 3.75 MIL/uL — ABNORMAL LOW (ref 4.22–5.81)
RDW: 17.2 % — ABNORMAL HIGH (ref 11.5–15.5)
WBC: 8.4 K/uL (ref 4.0–10.5)
nRBC: 0 % (ref 0.0–0.2)

## 2020-03-27 LAB — COMPREHENSIVE METABOLIC PANEL
ALT: 9 U/L (ref 0–44)
AST: 13 U/L — ABNORMAL LOW (ref 15–41)
Albumin: 3.2 g/dL — ABNORMAL LOW (ref 3.5–5.0)
Alkaline Phosphatase: 101 U/L (ref 38–126)
Anion gap: 9 (ref 5–15)
BUN: 11 mg/dL (ref 8–23)
CO2: 25 mmol/L (ref 22–32)
Calcium: 8.7 mg/dL — ABNORMAL LOW (ref 8.9–10.3)
Chloride: 102 mmol/L (ref 98–111)
Creatinine, Ser: 1.1 mg/dL (ref 0.61–1.24)
GFR calc Af Amer: >60 mL/min (ref >60)
GFR calc non Af Amer: >60 mL/min (ref >60)
Glucose, Bld: 161 mg/dL — ABNORMAL HIGH (ref 70–99)
Potassium: 3.7 mmol/L (ref 3.5–5.1)
Sodium: 136 mmol/L (ref 135–145)
Total Bilirubin: 0.5 mg/dL (ref 0.3–1.2)
Total Protein: 6.4 g/dL — ABNORMAL LOW (ref 6.5–8.1)

## 2020-03-27 LAB — PSA: Prostatic Specific Antigen: 97.12 ng/mL — ABNORMAL HIGH (ref 0.00–4.00)

## 2020-03-27 MED ORDER — SODIUM CHLORIDE 0.9 % IV SOLN: Freq: Once | INTRAVENOUS | Status: AC

## 2020-03-27 MED ORDER — HEPARIN SOD (PORK) LOCK FLUSH 100 UNIT/ML IV SOLN
500.0000 [IU] | Freq: Once | INTRAVENOUS | Status: AC | PRN
  Administered 2020-03-27: 500 [IU]

## 2020-03-27 MED ORDER — SODIUM CHLORIDE 0.9 % IV SOLN
10.0000 mg | Freq: Once | INTRAVENOUS | Status: AC
  Administered 2020-03-27: 10 mg via INTRAVENOUS
  Filled 2020-03-27: qty 10

## 2020-03-27 MED ORDER — SODIUM CHLORIDE 0.9 % IV SOLN
60.0000 mg/m2 | Freq: Once | INTRAVENOUS | Status: AC
  Administered 2020-03-27: 140 mg via INTRAVENOUS
  Filled 2020-03-27: qty 14

## 2020-03-27 MED ORDER — PALONOSETRON HCL INJECTION 0.25 MG/5ML
0.2500 mg | Freq: Once | INTRAVENOUS | Status: AC
  Administered 2020-03-27: 0.25 mg via INTRAVENOUS
  Filled 2020-03-27: qty 5

## 2020-03-27 MED ORDER — SODIUM CHLORIDE 0.9% FLUSH
10.0000 mL | INTRAVENOUS | Status: DC | PRN
  Administered 2020-03-27: 10 mL

## 2020-03-27 MED ORDER — FENTANYL 75 MCG/HR TD PT72: 1.0000 | MEDICATED_PATCH | TRANSDERMAL | 0 refills | Status: DC

## 2020-03-27 MED ORDER — PEGFILGRASTIM 6 MG/0.6ML ~~LOC~~ PSKT
6.0000 mg | PREFILLED_SYRINGE | Freq: Once | SUBCUTANEOUS | Status: AC
  Administered 2020-03-27: 6 mg via SUBCUTANEOUS

## 2020-03-27 NOTE — Progress Notes (Signed)
Patient has been assessed by Dr. Delton Coombes and labs reviewed. Okay to proceed with treatment today. Albumin low today, patient encouraged to improve protein intake in his diet.  Primary RN and pharmacy aware.

## 2020-03-27 NOTE — Patient Instructions (Signed)
[place] at [facility] Discharge Instructions  You were seen today by Dr. Delton Coombes. He went over your recent results. You received your treatment today. Eat more protein-dense meals to increase your blood protein levels, and supplement your meals with Boost or Ensure. Drink at least 60 ounces of water daily. You will be scheduled for bone scans before your next visit. Dr. Delton Coombes will see you back in 3 weeks for labs and follow up.   Thank you for choosing [place] at [facility] to provide your oncology and hematology care.  To afford each patient quality time with our provider, please arrive at least 15 minutes before your scheduled appointment time.   If you have a lab appointment with the [facility] please come in thru the Main Entrance and check in at the main information desk  You need to re-schedule your appointment should you arrive 10 or more minutes late.  We strive to give you quality time with our providers, and arriving late affects you and other patients whose appointments are after yours.  Also, if you no show three or more times for appointments you may be dismissed from the clinic at the providers discretion.     Again, thank you for choosing [facility].  Our hope is that these requests will decrease the amount of time that you wait before being seen by our physicians.       _____________________________________________________________  Should you have questions after your visit to [facility], please contact our office at [phone] between the hours of 8:00 a.m. and 4:30 p.m.  Voicemails left after 4:00 p.m. will not be returned until the following business day.  For prescription refill requests, have your pharmacy contact our office and allow 72 hours.    Cancer Center Support Programs:   > Cancer Support Group  2nd Tuesday of the month 1pm-2pm, Journey Room

## 2020-03-27 NOTE — Patient Instructions (Signed)
[facility] Discharge Instructions for Patients Receiving Chemotherapy   Beginning January 23rd 2017 lab work for the [facility] will be done in the  Main lab at [facility] on 1st floor. If you have a lab appointment with the [facility] please come in thru the  Main Entrance and check in at the main information desk   Today you received the following chemotherapy agents Docetaxel and Neulasta On-Pro  To help prevent nausea and vomiting after your treatment, we encourage you to take your nausea medication   If you develop nausea and vomiting, or diarrhea that is not controlled by your medication, call the clinic.  The clinic phone number is [phone]. Office hours are Monday-Friday 8:30am-5:00pm.  BELOW ARE SYMPTOMS THAT SHOULD BE REPORTED IMMEDIATELY:  *FEVER GREATER THAN 101.0 F  *CHILLS WITH OR WITHOUT FEVER  NAUSEA AND VOMITING THAT IS NOT CONTROLLED WITH YOUR NAUSEA MEDICATION  *UNUSUAL SHORTNESS OF BREATH  *UNUSUAL BRUISING OR BLEEDING  TENDERNESS IN MOUTH AND THROAT WITH OR WITHOUT PRESENCE OF ULCERS  *URINARY PROBLEMS  *BOWEL PROBLEMS  UNUSUAL RASH Items with * indicate a potential emergency and should be followed up as soon as possible. If you have an emergency after office hours please contact your primary care physician or go to the nearest emergency department.  Please call the clinic during office hours if you have any questions or concerns.   You may also contact the Patient Navigator at [phone] should you have any questions or need assistance in obtaining follow up care.      Resources For Cancer Patients and their Caregivers ? American Cancer Society: Can assist with transportation, wigs, general needs, runs Look Good Feel Better.        ([phone] ? Cancer Care: Provides financial assistance, online support groups, medication/co-pay assistance.  1-800-813-HOPE (970)421-5346) ? [facility] Assists  [place] cancer patients and their families through emotional , educational and financial support.  708-389-7180 ? [place] DSS Where to apply for food stamps, Medicaid and utility assistance. 559-494-4027 ? [facility]: Transportation to medical appointments. [phone] ? Social Security Administration: May apply for disability if have a Stage IV cancer. 727-068-4037 [phone] ? [employer], Disability and Transit Services: Assists with nutrition, care and transit needs. [phone]

## 2020-03-27 NOTE — Progress Notes (Signed)
Matthew [name] presents today for D1C7 Docetaxel . Pt denies any new changes or symptoms since last treatment. Lab results and vitals have been reviewed and are stable and within parameters for treatment. Patient has been assessed by Dr. Delton Coombes who has approved proceeding with treatment today as planned.  Infusion tolerated without incident or complaint. VSS upon completion of treatment. Port flushed and deaccessed per protocol, see MAR and IV flowsheet for details. Neulasta on-pro applied onto arm and green light visible. Pt instructed on time to take pump off as well as instances to report. Discharged in satisfactory condition with follow up instructions.

## 2020-03-27 NOTE — Progress Notes (Signed)
[address] [place], [place] [address]   CLINIC:  Medical Oncology/Hematology  PCP:  [name], [place] / [place] [address] [phone]   REASON FOR VISIT:  Follow-up for metastatic prostate cancer to bone  PRIOR THERAPY:  1. Docetaxel from 01/07/2016 to 04/21/2016. 2. Olaparib from 07/28/2019 to 11/03/2019 with progression.  NGS Results: Not done  CURRENT THERAPY: Docetaxel and Aloxi  BRIEF ONCOLOGIC HISTORY:  Oncology History  Prostate carcinoma ([place])  12/10/2015 Imaging   CTA chest, multiple thoracic and rib osseous lesions. no primary evident. Atherosclerosis including aortic and CAD   12/12/2015 Imaging   CT abdomen/pelvis with sclerotic osseous lesions througout the lumbar spine, sacrum, bilateral iliac bones and L proximal femur worrisome for sclerotic osseous mets,mild prostatomegaly, no LAD, Infrarenal 3.8 cm AAA   12/12/2015 Tumor Marker   PSA 153.85   12/18/2015 Imaging   Bone Scan Diffuse bony metastatic disease, posterior calvarium, sternum, thoracic, lumbar spine, bilateral ribs, L shoulder, bilateral bony pelvis, proximal L femur   12/24/2015 Initial Biopsy   CT biopsy of L iliac bone lesion   12/26/2015 Pathology Results   Bone, biopsy, left iliac - POSITIVE FOR METASTATIC ADENOCARCINOMA.   12/28/2015 - 09/01/2016 Chemotherapy   Firmagon instituted 240 mg    01/01/2016 Procedure   Port placed by IR.   01/07/2016 - 04/11/2016 Chemotherapy   Docetaxel every 21 days x 6 cycles with dose reductions due to tolerance   02/18/2016 Adverse Reaction   GI toxicity from docetaxel, dose reduced to 60 mg/m2   02/18/2016 Treatment Plan Change   Docetaxel dose reduced by 20%   03/06/2016 Procedure   Dr. Enrique Sack- Multiple extraction of tooth numbers 3, 5, 6, 8, 9, 10, 11, 12, 20, 21, 22, 27, 28, and 31. 4 Quadrants of alveoloplasty. Bilateral mandibular lingual tori reductions.   04/09/2016 Imaging   Brain MRI 1. No  intracranial metastatic disease 2. No acute intracranial abnormality 3. Findings of chronic microvascular disease   05/14/2016 Imaging   CT C/A/P Stable appearing diffuse bony metastatic disease. No new significant disease is seen Innumerable patchy sclerotic osseous metastases throughout the axial and proximal appendicular skeleton, stable in size and distribution, significantly increased in density in the interval, likely reflecting treatment effect. 2. No new or progressive metastatic disease in the chest, abdomen or pelvis. 3. Bilateral perifissural pulmonary nodules are stable and probably benign. 4. Aortic atherosclerosis. Infrarenal 4.3 cm abdominal aortic aneurysm, minimally increased in size. Recommend followup by ultrasound in 1 year.   06/02/2016 Tumor Marker   PSA 0.99 ng/ml    - 07/31/2016 Radiation Therapy   Palliative XRT by Dr. Lianne Cure in [place].    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  The Multi-Gene Panel offered by [facility] includes sequencing and/or deletion duplication testing of the following 85 genes: AIP, ALK, APC, ATM, AXIN2,BAP1,  BARD1, BLM, BMPR1A, BRCA1, BRCA2, BRIP1, CASR, CDC73, CDH1, CDK4, CDKN1B, CDKN1C, CDKN2A (p14ARF), CDKN2A (p16INK4a), CEBPA, CHEK2, CTNNA1, DICER1, DIS3L2, EGFR (c.2369C>T, p.Thr790Met variant only), EPCAM (Deletion/duplication testing only), FH, FLCN, GATA2, GPC3, GREM1 (Promoter region deletion/duplication testing only), HOXB13 (c.251G>A, p.Gly84Glu), HRAS, KIT, MAX, MEN1, MET, MITF (c.952G>A, p.Glu318Lys variant only), MLH1, MSH2, MSH3, MSH6, MUTYH, NBN, NF1, NF2, NTHL1, PALB2, PDGFRA, PHOX2B, PMS2, POLD1, POLE, POT1, PRKAR1A, PTCH1, PTEN, RAD50, RAD51C, RAD51D, RB1, RECQL4, RET, RNF43, RUNX1, SDHAF2, SDHA (  sequence changes only), SDHB, SDHC, SDHD, SMAD4, SMARCA4, SMARCB1, SMARCE1, STK11, SUFU, TERC,  TERT, TMEM127, TP53, TSC1, TSC2, VHL, WRN and WT1.  The report date is August 24, 2018  This is not thought to increase the risk for colon cancer or other cancers, unless there is the presence of two pathogenic mutations.   09/27/2018 - 06/27/2019 Chemotherapy   The patient had palonosetron (ALOXI) injection 0.25 mg, 0.25 mg, Intravenous,  Once, 10 of 10 cycles Administration: 0.25 mg (09/27/2018), 0.25 mg (10/25/2018), 0.25 mg (11/23/2018), 0.25 mg (12/21/2018), 0.25 mg (01/18/2019), 0.25 mg (02/15/2019), 0.25 mg (03/15/2019), 0.25 mg (04/12/2019), 0.25 mg (05/10/2019), 0.25 mg (06/07/2019) pegfilgrastim (NEULASTA ONPRO KIT) injection 6 mg, 6 mg, Subcutaneous, Once, 9 of 9 cycles Administration: 6 mg (10/25/2018), 6 mg (11/23/2018), 6 mg (12/21/2018), 6 mg (01/18/2019), 6 mg (02/15/2019), 6 mg (03/15/2019), 6 mg (04/12/2019), 6 mg (05/10/2019), 6 mg (06/07/2019) pegfilgrastim-cbqv (UDENYCA) injection 6 mg, 6 mg, Subcutaneous, Once, 1 of 1 cycle Administration: 6 mg (09/29/2018) cabazitaxel (JEVTANA) 35 mg in dextrose 5 % 250 mL chemo infusion, 15 mg/m2 = 35 mg (100 % of original dose 15 mg/m2), Intravenous,  Once, 10 of 10 cycles Dose modification: 15 mg/m2 (original dose 15 mg/m2, Cycle 1, Reason: Provider Judgment), 20 mg/m2 (original dose 15 mg/m2, Cycle 2, Reason: Provider Judgment), 20 mg/m2 (original dose 20 mg/m2, Cycle 5, Reason: Patient Age, Comment: Change in jevtana orderable), 15 mg/m2 (75 % of original dose 20 mg/m2, Cycle 8, Reason: Other (see comments), Comment: nausea, worsening numbness) Administration: 35 mg (09/27/2018), 46 mg (10/25/2018), 46 mg (11/23/2018), 46 mg (12/21/2018), 46 mg (01/18/2019), 46 mg (02/15/2019), 46 mg (03/15/2019), 35 mg (04/12/2019), 35 mg (05/10/2019), 35 mg (06/07/2019)  for chemotherapy treatment.    11/21/2019 -  Chemotherapy   The patient had palonosetron (ALOXI) injection 0.25 mg, 0.25 mg, Intravenous,  Once, 6 of 8 cycles Administration: 0.25 mg (11/21/2019), 0.25 mg (12/12/2019),  0.25 mg (01/03/2020), 0.25 mg (01/24/2020), 0.25 mg (02/14/2020), 0.25 mg (03/06/2020) pegfilgrastim (NEULASTA ONPRO KIT) injection 6 mg, 6 mg, Subcutaneous, Once, 6 of 8 cycles Administration: 6 mg (11/21/2019), 6 mg (12/12/2019), 6 mg (01/03/2020), 6 mg (01/24/2020), 6 mg (02/14/2020), 6 mg (03/06/2020) DOCEtaxel (TAXOTERE) 140 mg in sodium chloride 0.9 % 250 mL chemo infusion, 60 mg/m2 = 140 mg (80 % of original dose 75 mg/m2), Intravenous,  Once, 6 of 8 cycles Dose modification: 60 mg/m2 (80 % of original dose 75 mg/m2, Cycle 1, Reason: Other (see comments), Comment: based on previous tolerabiliity in 2017) Administration: 140 mg (11/21/2019), 140 mg (12/12/2019), 140 mg (01/03/2020), 140 mg (01/24/2020), 140 mg (02/14/2020), 140 mg (03/06/2020)  for chemotherapy treatment.    Bone metastases ([place])  12/26/2015 Initial Diagnosis   Bone metastases ([place])   09/27/2018 - 06/27/2019 Chemotherapy   The patient had palonosetron (ALOXI) injection 0.25 mg, 0.25 mg, Intravenous,  Once, 4 of 6 cycles Administration: 0.25 mg (09/27/2018), 0.25 mg (10/25/2018), 0.25 mg (11/23/2018), 0.25 mg (12/21/2018) pegfilgrastim (NEULASTA ONPRO KIT) injection 6 mg, 6 mg, Subcutaneous, Once, 3 of 5 cycles Administration: 6 mg (10/25/2018), 6 mg (11/23/2018), 6 mg (12/21/2018) pegfilgrastim-cbqv (UDENYCA) injection 6 mg, 6 mg, Subcutaneous, Once, 1 of 1 cycle Administration: 6 mg (09/29/2018) cabazitaxel (JEVTANA) 35 mg in dextrose 5 % 250 mL chemo infusion, 15 mg/m2 = 35 mg (100 % of original dose 15 mg/m2), Intravenous,  Once, 4 of 6 cycles Dose modification: 15 mg/m2 (original dose 15 mg/m2, Cycle 1, Reason: Provider Judgment), 20 mg/m2 (original dose 15 mg/m2,  Cycle 2, Reason: Provider Judgment) Administration: 35 mg (09/27/2018), 46 mg (10/25/2018), 46 mg (11/23/2018), 46 mg (12/21/2018)  for chemotherapy treatment.    11/21/2019 -  Chemotherapy   The patient had palonosetron (ALOXI) injection 0.25 mg, 0.25 mg, Intravenous,  Once, 6 of 8  cycles Administration: 0.25 mg (11/21/2019), 0.25 mg (12/12/2019), 0.25 mg (01/03/2020), 0.25 mg (01/24/2020), 0.25 mg (02/14/2020), 0.25 mg (03/06/2020) pegfilgrastim (NEULASTA ONPRO KIT) injection 6 mg, 6 mg, Subcutaneous, Once, 6 of 8 cycles Administration: 6 mg (11/21/2019), 6 mg (12/12/2019), 6 mg (01/03/2020), 6 mg (01/24/2020), 6 mg (02/14/2020), 6 mg (03/06/2020) DOCEtaxel (TAXOTERE) 140 mg in sodium chloride 0.9 % 250 mL chemo infusion, 60 mg/m2 = 140 mg (80 % of original dose 75 mg/m2), Intravenous,  Once, 6 of 8 cycles Dose modification: 60 mg/m2 (80 % of original dose 75 mg/m2, Cycle 1, Reason: Other (see comments), Comment: based on previous tolerabiliity in 2017) Administration: 140 mg (11/21/2019), 140 mg (12/12/2019), 140 mg (01/03/2020), 140 mg (01/24/2020), 140 mg (02/14/2020), 140 mg (03/06/2020)  for chemotherapy treatment.      CANCER STAGING: Cancer Staging Prostate carcinoma [facility]) Staging form: Prostate, AJCC 7th Edition - Clinical stage from 12/24/2015: Stage IV (TX, N0, M1b, PSA: 20 or greater) - Signed by Baird Cancer, PA-C on 01/07/2016   INTERVAL HISTORY:  Mr. ANITA MCADORY, a 64 y.o. male, returns for routine follow-up and consideration for next cycle of chemotherapy. Finas was last seen on 03/06/2020.  Due for cycle #7 of docetaxel and Aloxi today.   Overall, he tells me he has been feeling good and tolerating the treatments well. He reports feeling nauseous and fatigued after getting the treatments, but denies vomiting. His appetite is moderately decreased and he is not eating as much as before. He also complains of trouble swallowing which is reportedly worse in the morning, but denies jaw pain. His numbness is stable and he takes gabapentin for it. He drinks 2 cans of Ensure or Boost daily.  Overall, he feels ready for next cycle of chemo today.    REVIEW OF SYSTEMS:  Review of Systems  Constitutional: Positive for appetite change (moderately decreased) and fatigue  (moderate).  HENT:   Positive for trouble swallowing (worse in AM).   Gastrointestinal: Positive for nausea.  Musculoskeletal: Positive for arthralgias (3/10 L shoulder).  Neurological: Positive for numbness.  All other systems reviewed and are negative.   PAST MEDICAL/SURGICAL HISTORY:  Past Medical History:  Diagnosis Date  . Arthritis    Left knee  . Bone cancer ([place])   . Bone metastases ([place]) 12/26/2015  . Cancer [facility])    Prostate  . DVT (deep venous thrombosis) ([place]) 2004  . Family history of prostate cancer   . Family history of stomach cancer   . Kidney stones    ~2002  . Prostate carcinoma ([place]) 12/26/2015   Past Surgical History:  Procedure Laterality Date  . CIRCUMCISION  30 years ago  . MULTIPLE EXTRACTIONS WITH ALVEOLOPLASTY N/A 03/06/2016   Procedure: Extraction of tooth #'s 3,5,6,8-12,20-22, 27, 28, and 31 with alveoloplasty and bilateral mandibular tori reductions;  Surgeon: Lenn Cal, DDS;  Location: [facility];  Service: Oral Surgery;  Laterality: N/A;    SOCIAL HISTORY:  Social History   Socioeconomic History  . Marital status: Single    Spouse name: Not on file  . Number of children: 2  . Years of education: Not on file  . Highest education level: Not on file  Occupational History  .  Occupation: Construction/carpenter  Tobacco Use  . Smoking status: Current Every Day Smoker    Packs/day: 1.00    Years: 50.00    Pack years: 50.00    Types: Cigarettes    Start date: 06/16/1965  . Smokeless tobacco: Former Systems developer    Types: [place] date: 08/04/2005  Substance and Sexual Activity  . Alcohol use: No    Alcohol/week: 0.0 standard drinks  . Drug use: Yes    Types: Marijuana    Comment: THC for appetite; occ  . Sexual activity: Not on file  Other Topics Concern  . Not on file  Social History Narrative  . Not on file   Social Determinants of Health   Financial Resource Strain:   . Difficulty of Paying Living Expenses: Not on file  Food  Insecurity:   . Worried About Charity fundraiser in the Last Year: Not on file  . Ran Out of Food in the Last Year: Not on file  Transportation Needs:   . Lack of Transportation (Medical): Not on file  . Lack of Transportation (Non-Medical): Not on file  Physical Activity:   . Days of Exercise per Week: Not on file  . Minutes of Exercise per Session: Not on file  Stress:   . Feeling of Stress : Not on file  Social Connections:   . Frequency of Communication with Friends and Family: Not on file  . Frequency of Social Gatherings with Friends and Family: Not on file  . Attends Religious Services: Not on file  . Active Member of Clubs or Organizations: Not on file  . Attends Archivist Meetings: Not on file  . Marital Status: Not on file  Intimate Partner Violence:   . Fear of Current or Ex-Partner: Not on file  . Emotionally Abused: Not on file  . Physically Abused: Not on file  . Sexually Abused: Not on file    FAMILY HISTORY:  Family History  Problem Relation Age of Onset  . Stomach cancer Mother        dx 54s  . Aneurysm Maternal Aunt        brain  . Alcohol abuse Maternal Uncle   . Stomach cancer Maternal Grandmother        dx 50s-60s  . Prostate cancer Maternal Uncle        dx 29s    CURRENT MEDICATIONS:  Current Outpatient Medications  Medication Sig Dispense Refill  . DOCEtaxel (TAXOTERE IV) Inject 75 mg/m2 into the vein every 21 ( twenty-one) days.     Marland Kitchen docusate sodium (COLACE) 100 MG capsule Take 2 capsules daily to help soften the stool.  It this does not work, you can increase to 2 capsules in the morning and 2 capsules in the evening.  If you do not have a bowel movement please call the [facility]. 30 capsule 1  . fentaNYL (DURAGESIC) 75 MCG/HR Place 1 patch onto the skin every 3 (three) days. 10 patch 0  . gabapentin (NEURONTIN) 300 MG capsule Take 1 capsule (300 mg total) by mouth at bedtime. 30 capsule 3  . HYDROcodone-acetaminophen  (NORCO) 10-325 MG tablet Take 1 tablet by mouth every 6 (six) hours as needed. 112 tablet 0  . omeprazole (PRILOSEC) 20 MG capsule Take 1 capsule by mouth once daily 90 capsule 0  . ondansetron (ZOFRAN) 8 MG tablet Take every 8 hours as needed 30 tablet 6  . predniSONE (DELTASONE) 5 MG tablet  Take 1 tablet (5 mg total) by mouth daily with breakfast. 30 tablet 3  . prochlorperazine (COMPAZINE) 10 MG tablet Take 1 tablet (10 mg total) by mouth every 6 (six) hours as needed for nausea or vomiting. 60 tablet 6  . silodosin (RAPAFLO) 8 MG CAPS capsule Take 1 capsule (8 mg total) by mouth daily with breakfast. 30 capsule 11  . tamsulosin (FLOMAX) 0.4 MG CAPS capsule Take 1 capsule by mouth at bedtime 30 capsule 4  . traZODone (DESYREL) 100 MG tablet Take 1 tablet (100 mg total) by mouth at bedtime as needed for sleep. 30 tablet 3  . NARCAN 4 MG/0.1ML LIQD nasal spray kit Place 1 spray into the nose as needed.  (Patient not taking: Reported on 03/06/2020)     No current facility-administered medications for this visit.   Facility-Administered Medications Ordered in Other Visits  Medication Dose Route Frequency Provider Last Rate Last Admin  . sodium chloride flush (NS) 0.9 % injection 10 mL  10 mL Intracatheter PRN Derek Jack, MD   10 mL at 01/18/19 0815    ALLERGIES:  Allergies  Allergen Reactions  . Meloxicam Other (See Comments)    Caused stomach bleeding.    PHYSICAL EXAM:  Performance status (ECOG): 1 - Symptomatic but completely ambulatory  Vitals:   03/27/20 0947  BP: 97/65  Pulse: 95  Resp: 18  Temp: (!) 97.2 F (36.2 C)  SpO2: 99%   Wt Readings from Last 3 Encounters:  03/27/20 213 lb 12.8 oz (97 kg)  02/14/20 217 lb 9.6 oz (98.7 kg)  01/24/20 224 lb 12.8 oz (102 kg)   Physical Exam Vitals reviewed.  Constitutional:      Appearance: Normal appearance.  Cardiovascular:     Rate and Rhythm: Normal rate and regular rhythm.     Pulses: Normal pulses.     Heart  sounds: Normal heart sounds.  Pulmonary:     Effort: Pulmonary effort is normal.     Breath sounds: Normal breath sounds.  Neurological:     General: No focal deficit present.     Mental Status: He is alert and oriented to person, place, and time.  Psychiatric:        Mood and Affect: Mood normal.        Behavior: Behavior normal.     LABORATORY DATA:  I have reviewed the labs as listed.  CBC Latest Ref Rng & Units 03/27/2020 03/06/2020 02/14/2020  WBC 4.0 - 10.5 K/uL 8.4 10.5 7.1  Hemoglobin 13.0 - 17.0 g/dL 11.8(L) 11.6(L) 11.3(L)  Hematocrit 39 - 52 % 36.6(L) 36.4(L) 35.6(L)  Platelets 150 - 400 K/uL 220 248 271   CMP Latest Ref Rng & Units 03/27/2020 03/06/2020 02/14/2020  Glucose 70 - 99 mg/dL 161(H) 178(H) 120(H)  BUN 8 - 23 mg/dL _0 Creatinine 0.61 - 1.24 mg/dL 1.10 0.97 1.05  Sodium 135 - 145 mmol/L 136 137 138  Potassium 3.5 - 5.1 mmol/L 3.7 4.0 3.4(L)  Chloride 98 - 111 mmol/L 102 102 103  CO2 22 - 32 mmol/L _1 Calcium 8.9 - 10.3 mg/dL 8.7(L) 8.7(L) 8.9  Total Protein 6.5 - 8.1 g/dL 6.4(L) 6.6 6.3(L)  Total Bilirubin 0.3 - 1.2 mg/dL 0.5 0.4 0.4  Alkaline Phos 38 - 126 U/L 101 114 115  AST 15 - 41 U/L 13(L) 12(L) 14(L)  ALT 0 - 44 U/L _2 Lab Results  Component Value Date   PSA 0.20 12/10/2016  PSA 0.28 09/29/2016   PSA 0.43 09/01/2016    DIAGNOSTIC IMAGING:  I have independently reviewed the scans and discussed with the patient. No results found.   ASSESSMENT:  1. 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 -Abiraterone/prednisone, cabazitaxel with progression. -Guardant 360 showed ATM mutation. -Olaparib 300 mg twice daily from 07/28/2019 through 11/03/2019 with progression. -Bone scan on 11/10/2019 showed worsening disease. PSA was 132 on 11/21/2019. -CT scan on 08/11/2019 showed bone metastasis with no visceral metastasis. -Docetaxel 60 mg per metered  square started on 11/21/2019. -Bone scan on 01/23/2020 showed stable osseous metastatic disease. -PSA on 03/06/2020 improved to 89.   PLAN:  1. 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  I reviewed his LFTs which showed decreased albumin at 3.2. -CBC was normal.  He had some nausea but denied any vomiting. -We will proceed with his next cycle today. -Plan to repeat bone scan prior to next visit in 3 weeks.  2. Bone Metastatic Disease: -Denosumab indefinitely on hold since 12/21/2018 secondary to ONJ.  3. Back Pain: -Continue fentanyl 75 mcg patch.  Continue hydrocodone 10 mg every 5-6 hours as needed.  Pain today is 3 out of 10.  4. Mild Hypercalcemia: -Calcium is 8.7 with albumin of 3.2.  5. Elevated Creatinine: -Creatinine today is 1.10.  6. Peripheral neuropathy: -Continue gabapentin 300 mg at bedtime.   Orders placed this encounter:  Orders Placed This Encounter  Procedures  . NM Bone Scan Whole Body     Derek Jack, MD [place] [phone]   I, Milinda Antis, am acting as a scribe for Dr. Sanda Linger.  I, Derek Jack MD, have reviewed the above documentation for accuracy and completeness, and I agree with the above.

## 2020-04-11 ENCOUNTER — Other Ambulatory Visit (HOSPITAL_COMMUNITY): Payer: Self-pay | Admitting: *Deleted

## 2020-04-11 DIAGNOSIS — G893 Neoplasm related pain (acute) (chronic): Secondary | ICD-10-CM

## 2020-04-11 DIAGNOSIS — C7951 Secondary malignant neoplasm of bone: Secondary | ICD-10-CM

## 2020-04-11 DIAGNOSIS — C61 Malignant neoplasm of prostate: Secondary | ICD-10-CM

## 2020-04-11 MED ORDER — HYDROCODONE-ACETAMINOPHEN 10-325 MG PO TABS: 1.0000 | ORAL_TABLET | Freq: Four times a day (QID) | ORAL | 0 refills | Status: DC | PRN

## 2020-04-12 ENCOUNTER — Encounter (HOSPITAL_COMMUNITY): Payer: Medicare Other

## 2020-04-17 ENCOUNTER — Inpatient Hospital Stay (HOSPITAL_COMMUNITY): Payer: Medicare Other | Attending: Hematology

## 2020-04-17 ENCOUNTER — Inpatient Hospital Stay (HOSPITAL_BASED_OUTPATIENT_CLINIC_OR_DEPARTMENT_OTHER): Payer: Medicare Other | Admitting: Hematology

## 2020-04-17 ENCOUNTER — Other Ambulatory Visit: Payer: Self-pay

## 2020-04-17 ENCOUNTER — Encounter (HOSPITAL_COMMUNITY): Payer: Self-pay | Admitting: Hematology

## 2020-04-17 ENCOUNTER — Inpatient Hospital Stay (HOSPITAL_COMMUNITY): Payer: Medicare Other

## 2020-04-17 VITALS — BP 99/60 | HR 84 | Temp 97.0°F | Resp 19

## 2020-04-17 VITALS — BP 109/68 | HR 99 | Temp 97.0°F | Resp 20 | Wt 212.6 lb

## 2020-04-17 DIAGNOSIS — C61 Malignant neoplasm of prostate: Secondary | ICD-10-CM

## 2020-04-17 DIAGNOSIS — Z5111 Encounter for antineoplastic chemotherapy: Secondary | ICD-10-CM | POA: Diagnosis not present

## 2020-04-17 DIAGNOSIS — Z5189 Encounter for other specified aftercare: Secondary | ICD-10-CM | POA: Insufficient documentation

## 2020-04-17 DIAGNOSIS — C7951 Secondary malignant neoplasm of bone: Secondary | ICD-10-CM | POA: Diagnosis not present

## 2020-04-17 LAB — COMPREHENSIVE METABOLIC PANEL
ALT: 8 U/L (ref 0–44)
AST: 12 U/L — ABNORMAL LOW (ref 15–41)
Albumin: 3.4 g/dL — ABNORMAL LOW (ref 3.5–5.0)
Alkaline Phosphatase: 80 U/L (ref 38–126)
Anion gap: 9 (ref 5–15)
BUN: 16 mg/dL (ref 8–23)
CO2: 26 mmol/L (ref 22–32)
Calcium: 9.1 mg/dL (ref 8.9–10.3)
Chloride: 104 mmol/L (ref 98–111)
Creatinine, Ser: 0.97 mg/dL (ref 0.61–1.24)
GFR calc Af Amer: >60 mL/min (ref >60)
GFR calc non Af Amer: >60 mL/min (ref >60)
Glucose, Bld: 138 mg/dL — ABNORMAL HIGH (ref 70–99)
Potassium: 3.7 mmol/L (ref 3.5–5.1)
Sodium: 139 mmol/L (ref 135–145)
Total Bilirubin: 0.4 mg/dL (ref 0.3–1.2)
Total Protein: 6.1 g/dL — ABNORMAL LOW (ref 6.5–8.1)

## 2020-04-17 LAB — CBC WITH DIFFERENTIAL/PLATELET
Abs Immature Granulocytes: 0.05 10*3/uL (ref 0.00–0.07)
Basophils Absolute: 0 10*3/uL (ref 0.0–0.1)
Basophils Relative: 0 %
Eosinophils Absolute: 0.1 10*3/uL (ref 0.0–0.5)
Eosinophils Relative: 1 %
HCT: 37 % — ABNORMAL LOW (ref 39.0–52.0)
Hemoglobin: 11.7 g/dL — ABNORMAL LOW (ref 13.0–17.0)
Immature Granulocytes: 1 %
Lymphocytes Relative: 8 %
Lymphs Abs: 0.8 10*3/uL (ref 0.7–4.0)
MCH: 30.9 pg (ref 26.0–34.0)
MCHC: 31.6 g/dL (ref 30.0–36.0)
MCV: 97.6 fL (ref 80.0–100.0)
Monocytes Absolute: 0.7 10*3/uL (ref 0.1–1.0)
Monocytes Relative: 7 %
Neutro Abs: 8.7 10*3/uL — ABNORMAL HIGH (ref 1.7–7.7)
Neutrophils Relative %: 83 %
Platelets: 198 10*3/uL (ref 150–400)
RBC: 3.79 MIL/uL — ABNORMAL LOW (ref 4.22–5.81)
RDW: 16.8 % — ABNORMAL HIGH (ref 11.5–15.5)
WBC: 10.4 10*3/uL (ref 4.0–10.5)
nRBC: 0 % (ref 0.0–0.2)

## 2020-04-17 LAB — MAGNESIUM: Magnesium: 2 mg/dL (ref 1.7–2.4)

## 2020-04-17 LAB — PSA: Prostatic Specific Antigen: 136.03 ng/mL — ABNORMAL HIGH (ref 0.00–4.00)

## 2020-04-17 MED ORDER — SODIUM CHLORIDE 0.9 % IV SOLN
60.0000 mg/m2 | Freq: Once | INTRAVENOUS | Status: AC
  Administered 2020-04-17: 140 mg via INTRAVENOUS
  Filled 2020-04-17: qty 14

## 2020-04-17 MED ORDER — SODIUM CHLORIDE 0.9% FLUSH
10.0000 mL | INTRAVENOUS | Status: DC | PRN
  Administered 2020-04-17: 10 mL

## 2020-04-17 MED ORDER — LEUPROLIDE ACETATE (3 MONTH) 22.5 MG ~~LOC~~ KIT
22.5000 mg | PACK | Freq: Once | SUBCUTANEOUS | Status: AC
  Administered 2020-04-17: 22.5 mg via SUBCUTANEOUS
  Filled 2020-04-17: qty 22.5

## 2020-04-17 MED ORDER — HYDROMORPHONE HCL 2 MG PO TABS: 2.0000 mg | ORAL_TABLET | ORAL | 0 refills | Status: DC | PRN

## 2020-04-17 MED ORDER — PALONOSETRON HCL INJECTION 0.25 MG/5ML
0.2500 mg | Freq: Once | INTRAVENOUS | Status: AC
  Administered 2020-04-17: 0.25 mg via INTRAVENOUS
  Filled 2020-04-17: qty 5

## 2020-04-17 MED ORDER — HYDROMORPHONE HCL 1 MG/ML IJ SOLN
2.0000 mg | Freq: Once | INTRAMUSCULAR | Status: AC
  Administered 2020-04-17: 2 mg via INTRAVENOUS
  Filled 2020-04-17: qty 2

## 2020-04-17 MED ORDER — SODIUM CHLORIDE 0.9 % IV SOLN
10.0000 mg | Freq: Once | INTRAVENOUS | Status: AC
  Administered 2020-04-17: 10 mg via INTRAVENOUS
  Filled 2020-04-17: qty 10

## 2020-04-17 MED ORDER — SODIUM CHLORIDE 0.9 % IV SOLN: Freq: Once | INTRAVENOUS | Status: AC

## 2020-04-17 MED ORDER — PEGFILGRASTIM 6 MG/0.6ML ~~LOC~~ PSKT
6.0000 mg | PREFILLED_SYRINGE | Freq: Once | SUBCUTANEOUS | Status: AC
  Administered 2020-04-17: 6 mg via SUBCUTANEOUS
  Filled 2020-04-17: qty 0.6

## 2020-04-17 MED ORDER — HEPARIN SOD (PORK) LOCK FLUSH 100 UNIT/ML IV SOLN
500.0000 [IU] | Freq: Once | INTRAVENOUS | Status: AC | PRN
  Administered 2020-04-17: 500 [IU]

## 2020-04-17 NOTE — Patient Instructions (Signed)
[facility] Discharge Instructions for Patients Receiving Chemotherapy  Today you received the following chemotherapy agents   To help prevent nausea and vomiting after your treatment, we encourage you to take your nausea medication   If you develop nausea and vomiting that is not controlled by your nausea medication, call the clinic.   BELOW ARE SYMPTOMS THAT SHOULD BE REPORTED IMMEDIATELY:  *FEVER GREATER THAN 100.5 F  *CHILLS WITH OR WITHOUT FEVER  NAUSEA AND VOMITING THAT IS NOT CONTROLLED WITH YOUR NAUSEA MEDICATION  *UNUSUAL SHORTNESS OF BREATH  *UNUSUAL BRUISING OR BLEEDING  TENDERNESS IN MOUTH AND THROAT WITH OR WITHOUT PRESENCE OF ULCERS  *URINARY PROBLEMS  *BOWEL PROBLEMS  UNUSUAL RASH Items with * indicate a potential emergency and should be followed up as soon as possible.  Feel free to call the clinic should you have any questions or concerns. The clinic phone number is [phone].  Please show the CHEMO ALERT CARD at check-in to the Emergency Department and triage nurse.   

## 2020-04-17 NOTE — Progress Notes (Signed)
Patient was assessed by Dr. Delton Coombes and labs have been reviewed.  Patient is in pain today and his hydrocodone is no longer working, wi will give one dose of dilaudid 2 mg IV today and then reassess pain to determine it's efficacy.  Patient is okay to proceed with treatment today. Primary RN and pharmacy aware.

## 2020-04-17 NOTE — Progress Notes (Signed)
Matthew [name], [place] [address]   CLINIC:  Medical Oncology/Hematology  PCP:  [name], [place] [address] [phone]   REASON FOR VISIT:  Follow-up for metastatic prostate cancer to bone  PRIOR THERAPY:  1. Docetaxel x 6 cycles from 01/07/2016 to 04/21/2016. 2. Olaparib from 07/28/2019 to 11/03/2019 with progression.  NGS Results: Not done  CURRENT THERAPY: Docetaxel and Aloxi every 3 weeks  BRIEF ONCOLOGIC HISTORY:  Oncology History  Prostate carcinoma ([place])  12/10/2015 Imaging   CTA chest, multiple thoracic and rib osseous lesions. no primary evident. Atherosclerosis including aortic and CAD   12/12/2015 Imaging   CT abdomen/pelvis with sclerotic osseous lesions througout the lumbar spine, sacrum, bilateral iliac bones and L proximal femur worrisome for sclerotic osseous mets,mild prostatomegaly, no LAD, Infrarenal 3.8 cm AAA   12/12/2015 Tumor Marker   PSA 153.85   12/18/2015 Imaging   Bone Scan Diffuse bony metastatic disease, posterior calvarium, sternum, thoracic, lumbar spine, bilateral ribs, L shoulder, bilateral bony pelvis, proximal L femur   12/24/2015 Initial Biopsy   CT biopsy of L iliac bone lesion   12/26/2015 Pathology Results   Bone, biopsy, left iliac - POSITIVE FOR METASTATIC ADENOCARCINOMA.   12/28/2015 - 09/01/2016 Chemotherapy   Firmagon instituted 240 mg    01/01/2016 Procedure   Port placed by IR.   01/07/2016 - 04/11/2016 Chemotherapy   Docetaxel every 21 days x 6 cycles with dose reductions due to tolerance   02/18/2016 Adverse Reaction   GI toxicity from docetaxel, dose reduced to 60 mg/m2   02/18/2016 Treatment Plan Change   Docetaxel dose reduced by 20%   03/06/2016 Procedure   Dr. Enrique Sack- Multiple extraction of tooth numbers 3, 5, 6, 8, 9, 10, 11, 12, 20, 21, 22, 27, 28, and 31. 4 Quadrants of alveoloplasty. Bilateral mandibular lingual tori reductions.   04/09/2016  Imaging   Brain MRI 1. No intracranial metastatic disease 2. No acute intracranial abnormality 3. Findings of chronic microvascular disease   05/14/2016 Imaging   CT C/A/P Stable appearing diffuse bony metastatic disease. No new significant disease is seen Innumerable patchy sclerotic osseous metastases throughout the axial and proximal appendicular skeleton, stable in size and distribution, significantly increased in density in the interval, likely reflecting treatment effect. 2. No new or progressive metastatic disease in the chest, abdomen or pelvis. 3. Bilateral perifissural pulmonary nodules are stable and probably benign. 4. Aortic atherosclerosis. Infrarenal 4.3 cm abdominal aortic aneurysm, minimally increased in size. Recommend followup by ultrasound in 1 year.   06/02/2016 Tumor Marker   PSA 0.99 ng/ml    - 07/31/2016 Radiation Therapy   Palliative XRT by Dr. Lianne Cure in [place].    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  The Multi-Gene Panel offered by [facility] includes sequencing and/or deletion duplication testing of the following 85 genes: AIP, ALK, APC, ATM, AXIN2,BAP1,  BARD1, BLM, BMPR1A, BRCA1, BRCA2, BRIP1, CASR, CDC73, CDH1, CDK4, CDKN1B, CDKN1C, CDKN2A (p14ARF), CDKN2A (p16INK4a), CEBPA, CHEK2, CTNNA1, DICER1, DIS3L2, EGFR (c.2369C>T, p.Thr790Met variant only), EPCAM (Deletion/duplication testing only), FH, FLCN, GATA2, GPC3, GREM1 (Promoter region deletion/duplication testing only), HOXB13 (c.251G>A, p.Gly84Glu), HRAS, KIT, MAX, MEN1, MET, MITF (c.952G>A, p.Glu318Lys variant only), MLH1, MSH2, MSH3, MSH6, MUTYH, NBN, NF1, NF2, NTHL1, PALB2, PDGFRA, PHOX2B, PMS2, POLD1, POLE, POT1, PRKAR1A, PTCH1, PTEN, RAD50, RAD51C, RAD51D, RB1,  RECQL4, RET, RNF43, RUNX1, SDHAF2, SDHA (sequence changes only), SDHB, SDHC, SDHD, SMAD4, SMARCA4, SMARCB1,  SMARCE1, STK11, SUFU, TERC, TERT, TMEM127, TP53, TSC1, TSC2, VHL, WRN and WT1.  The report date is August 24, 2018  This is not thought to increase the risk for colon cancer or other cancers, unless there is the presence of two pathogenic mutations.   09/27/2018 - 06/27/2019 Chemotherapy   The patient had palonosetron (ALOXI) injection 0.25 mg, 0.25 mg, Intravenous,  Once, 10 of 10 cycles Administration: 0.25 mg (09/27/2018), 0.25 mg (10/25/2018), 0.25 mg (11/23/2018), 0.25 mg (12/21/2018), 0.25 mg (01/18/2019), 0.25 mg (02/15/2019), 0.25 mg (03/15/2019), 0.25 mg (04/12/2019), 0.25 mg (05/10/2019), 0.25 mg (06/07/2019) pegfilgrastim (NEULASTA ONPRO KIT) injection 6 mg, 6 mg, Subcutaneous, Once, 9 of 9 cycles Administration: 6 mg (10/25/2018), 6 mg (11/23/2018), 6 mg (12/21/2018), 6 mg (01/18/2019), 6 mg (02/15/2019), 6 mg (03/15/2019), 6 mg (04/12/2019), 6 mg (05/10/2019), 6 mg (06/07/2019) pegfilgrastim-cbqv (UDENYCA) injection 6 mg, 6 mg, Subcutaneous, Once, 1 of 1 cycle Administration: 6 mg (09/29/2018) cabazitaxel (JEVTANA) 35 mg in dextrose 5 % 250 mL chemo infusion, 15 mg/m2 = 35 mg (100 % of original dose 15 mg/m2), Intravenous,  Once, 10 of 10 cycles Dose modification: 15 mg/m2 (original dose 15 mg/m2, Cycle 1, Reason: Provider Judgment), 20 mg/m2 (original dose 15 mg/m2, Cycle 2, Reason: Provider Judgment), 20 mg/m2 (original dose 20 mg/m2, Cycle 5, Reason: Patient Age, Comment: Change in jevtana orderable), 15 mg/m2 (75 % of original dose 20 mg/m2, Cycle 8, Reason: Other (see comments), Comment: nausea, worsening numbness) Administration: 35 mg (09/27/2018), 46 mg (10/25/2018), 46 mg (11/23/2018), 46 mg (12/21/2018), 46 mg (01/18/2019), 46 mg (02/15/2019), 46 mg (03/15/2019), 35 mg (04/12/2019), 35 mg (05/10/2019), 35 mg (06/07/2019)  for chemotherapy treatment.    11/21/2019 -  Chemotherapy   The patient had palonosetron (ALOXI) injection 0.25 mg, 0.25 mg, Intravenous,  Once, 7 of 10 cycles Administration: 0.25 mg  (11/21/2019), 0.25 mg (12/12/2019), 0.25 mg (01/03/2020), 0.25 mg (01/24/2020), 0.25 mg (02/14/2020), 0.25 mg (03/06/2020), 0.25 mg (03/27/2020) pegfilgrastim (NEULASTA ONPRO KIT) injection 6 mg, 6 mg, Subcutaneous, Once, 7 of 10 cycles Administration: 6 mg (11/21/2019), 6 mg (12/12/2019), 6 mg (01/03/2020), 6 mg (01/24/2020), 6 mg (02/14/2020), 6 mg (03/06/2020) DOCEtaxel (TAXOTERE) 140 mg in sodium chloride 0.9 % 250 mL chemo infusion, 60 mg/m2 = 140 mg (80 % of original dose 75 mg/m2), Intravenous,  Once, 7 of 10 cycles Dose modification: 60 mg/m2 (80 % of original dose 75 mg/m2, Cycle 1, Reason: Other (see comments), Comment: based on previous tolerabiliity in 2017) Administration: 140 mg (11/21/2019), 140 mg (12/12/2019), 140 mg (01/03/2020), 140 mg (01/24/2020), 140 mg (02/14/2020), 140 mg (03/06/2020), 140 mg (03/27/2020)  for chemotherapy treatment.    Bone metastases ([place])  12/26/2015 Initial Diagnosis   Bone metastases ([place])   09/27/2018 - 06/27/2019 Chemotherapy   The patient had palonosetron (ALOXI) injection 0.25 mg, 0.25 mg, Intravenous,  Once, 4 of 6 cycles Administration: 0.25 mg (09/27/2018), 0.25 mg (10/25/2018), 0.25 mg (11/23/2018), 0.25 mg (12/21/2018) pegfilgrastim (NEULASTA ONPRO KIT) injection 6 mg, 6 mg, Subcutaneous, Once, 3 of 5 cycles Administration: 6 mg (10/25/2018), 6 mg (11/23/2018), 6 mg (12/21/2018) pegfilgrastim-cbqv (UDENYCA) injection 6 mg, 6 mg, Subcutaneous, Once, 1 of 1 cycle Administration: 6 mg (09/29/2018) cabazitaxel (JEVTANA) 35 mg in dextrose 5 % 250 mL chemo infusion, 15 mg/m2 = 35 mg (100 % of original dose 15 mg/m2), Intravenous,  Once, 4 of 6 cycles Dose modification: 15 mg/m2 (original dose 15  mg/m2, Cycle 1, Reason: Provider Judgment), 20 mg/m2 (original dose 15 mg/m2, Cycle 2, Reason: Provider Judgment) Administration: 35 mg (09/27/2018), 46 mg (10/25/2018), 46 mg (11/23/2018), 46 mg (12/21/2018)  for chemotherapy treatment.    11/21/2019 -  Chemotherapy   The patient had  palonosetron (ALOXI) injection 0.25 mg, 0.25 mg, Intravenous,  Once, 7 of 10 cycles Administration: 0.25 mg (11/21/2019), 0.25 mg (12/12/2019), 0.25 mg (01/03/2020), 0.25 mg (01/24/2020), 0.25 mg (02/14/2020), 0.25 mg (03/06/2020), 0.25 mg (03/27/2020) pegfilgrastim (NEULASTA ONPRO KIT) injection 6 mg, 6 mg, Subcutaneous, Once, 7 of 10 cycles Administration: 6 mg (11/21/2019), 6 mg (12/12/2019), 6 mg (01/03/2020), 6 mg (01/24/2020), 6 mg (02/14/2020), 6 mg (03/06/2020) DOCEtaxel (TAXOTERE) 140 mg in sodium chloride 0.9 % 250 mL chemo infusion, 60 mg/m2 = 140 mg (80 % of original dose 75 mg/m2), Intravenous,  Once, 7 of 10 cycles Dose modification: 60 mg/m2 (80 % of original dose 75 mg/m2, Cycle 1, Reason: Other (see comments), Comment: based on previous tolerabiliity in 2017) Administration: 140 mg (11/21/2019), 140 mg (12/12/2019), 140 mg (01/03/2020), 140 mg (01/24/2020), 140 mg (02/14/2020), 140 mg (03/06/2020), 140 mg (03/27/2020)  for chemotherapy treatment.      CANCER STAGING: Cancer Staging Prostate carcinoma [facility]) Staging form: Prostate, AJCC 7th Edition - Clinical stage from 12/24/2015: Stage IV (TX, N0, M1b, PSA: 20 or greater) - Signed by Baird Cancer, PA-C on 01/07/2016   INTERVAL HISTORY:  Matthew [name], a 64 y.o. male, returns for routine follow-up and consideration for next cycle of chemotherapy. Matthew [name] was last seen on 03/27/2020.  Due for cycle #8 of docetaxel and Aloxi today.   Today he reports that he has been constantly hurting in his thoracic spine radiating into his right shoulder for the past 3-4 days since straining himself moving his trash can; he takes 3-5 tablets of Norco, fentanyl patch and the heating pad have not been alleviating the pain at all. His appetite has not been good since the pain started; he is drinking Boost and eating a protein bar daily.  Overall, he feels ready for next cycle of chemo today.    REVIEW OF SYSTEMS:  Review of Systems  Constitutional: Positive  for appetite change (severely decreased) and fatigue (severe).  HENT:   Positive for trouble swallowing (difficulty swallowing solids occasionally).   Respiratory: Positive for cough and shortness of breath.   Cardiovascular: Positive for chest pain.  Gastrointestinal: Positive for nausea.  Genitourinary: Positive for dysuria.   Musculoskeletal: Positive for back pain (7/10 spine and R shoulder).  Neurological: Positive for numbness (toes).  Psychiatric/Behavioral: Positive for sleep disturbance.  All other systems reviewed and are negative.   PAST MEDICAL/SURGICAL HISTORY:  Past Medical History:  Diagnosis Date  . Arthritis    Left knee  . Bone cancer ([place])   . Bone metastases ([place]) 12/26/2015  . Cancer [facility])    Prostate  . DVT (deep venous thrombosis) ([place]) 2004  . Family history of prostate cancer   . Family history of stomach cancer   . Kidney stones    ~2002  . Prostate carcinoma ([place]) 12/26/2015   Past Surgical History:  Procedure Laterality Date  . CIRCUMCISION  30 years ago  . MULTIPLE EXTRACTIONS WITH ALVEOLOPLASTY N/A 03/06/2016   Procedure: Extraction of tooth #'s 3,5,6,8-12,20-22, 27, 28, and 31 with alveoloplasty and bilateral mandibular tori reductions;  Surgeon: Lenn Cal, DDS;  Location: [facility];  Service: Oral Surgery;  Laterality: N/A;    SOCIAL HISTORY:  Social History   Socioeconomic History  . Marital status: Single    Spouse name: Not on file  . Number of children: 2  . Years of education: Not on file  . Highest education level: Not on file  Occupational History  . Occupation: Construction/carpenter  Tobacco Use  . Smoking status: Current Every Day Smoker    Packs/day: 1.00    Years: 50.00    Pack years: 50.00    Types: Cigarettes    Start date: 06/16/1965  . Smokeless tobacco: Former Systems developer    Types: [place] date: 08/04/2005  Substance and Sexual Activity  . Alcohol use: No    Alcohol/week: 0.0 standard drinks  . Drug use: Yes     Types: Marijuana    Comment: THC for appetite; occ  . Sexual activity: Not on file  Other Topics Concern  . Not on file  Social History Narrative  . Not on file   Social Determinants of Health   Financial Resource Strain:   . Difficulty of Paying Living Expenses: Not on file  Food Insecurity:   . Worried About Charity fundraiser in the Last Year: Not on file  . Ran Out of Food in the Last Year: Not on file  Transportation Needs:   . Lack of Transportation (Medical): Not on file  . Lack of Transportation (Non-Medical): Not on file  Physical Activity:   . Days of Exercise per Week: Not on file  . Minutes of Exercise per Session: Not on file  Stress:   . Feeling of Stress : Not on file  Social Connections:   . Frequency of Communication with Friends and Family: Not on file  . Frequency of Social Gatherings with Friends and Family: Not on file  . Attends Religious Services: Not on file  . Active Member of Clubs or Organizations: Not on file  . Attends Archivist Meetings: Not on file  . Marital Status: Not on file  Intimate Partner Violence:   . Fear of Current or Ex-Partner: Not on file  . Emotionally Abused: Not on file  . Physically Abused: Not on file  . Sexually Abused: Not on file    FAMILY HISTORY:  Family History  Problem Relation Age of Onset  . Stomach cancer Mother        dx 105s  . Aneurysm Maternal Aunt        brain  . Alcohol abuse Maternal Uncle   . Stomach cancer Maternal Grandmother        dx 50s-60s  . Prostate cancer Maternal Uncle        dx 73s    CURRENT MEDICATIONS:  Current Outpatient Medications  Medication Sig Dispense Refill  . DOCEtaxel (TAXOTERE IV) Inject 75 mg/m2 into the vein every 21 ( twenty-one) days.     Marland Kitchen docusate sodium (COLACE) 100 MG capsule Take 2 capsules daily to help soften the stool.  It this does not work, you can increase to 2 capsules in the morning and 2 capsules in the evening.  If you do not have a bowel  movement please call the [facility]. 30 capsule 1  . fentaNYL (DURAGESIC) 75 MCG/HR Place 1 patch onto the skin every 3 (three) days. 10 patch 0  . gabapentin (NEURONTIN) 300 MG capsule Take 1 capsule (300 mg total) by mouth at bedtime. 30 capsule 3  . HYDROcodone-acetaminophen (NORCO) 10-325 MG tablet Take 1 tablet by mouth every 6 (six)  hours as needed. 112 tablet 0  . NARCAN 4 MG/0.1ML LIQD nasal spray kit Place 1 spray into the nose as needed.  (Patient not taking: Reported on 03/06/2020)    . omeprazole (PRILOSEC) 20 MG capsule Take 1 capsule by mouth once daily 90 capsule 0  . ondansetron (ZOFRAN) 8 MG tablet Take every 8 hours as needed 30 tablet 6  . predniSONE (DELTASONE) 5 MG tablet Take 1 tablet (5 mg total) by mouth daily with breakfast. 30 tablet 3  . prochlorperazine (COMPAZINE) 10 MG tablet Take 1 tablet (10 mg total) by mouth every 6 (six) hours as needed for nausea or vomiting. 60 tablet 6  . silodosin (RAPAFLO) 8 MG CAPS capsule Take 1 capsule (8 mg total) by mouth daily with breakfast. 30 capsule 11  . tamsulosin (FLOMAX) 0.4 MG CAPS capsule Take 1 capsule by mouth at bedtime 30 capsule 4  . traZODone (DESYREL) 100 MG tablet Take 1 tablet (100 mg total) by mouth at bedtime as needed for sleep. 30 tablet 3   No current facility-administered medications for this visit.   Facility-Administered Medications Ordered in Other Visits  Medication Dose Route Frequency Provider Last Rate Last Admin  . sodium chloride flush (NS) 0.9 % injection 10 mL  10 mL Intracatheter PRN Derek Jack, MD   10 mL at 01/18/19 0815    ALLERGIES:  Allergies  Allergen Reactions  . Meloxicam Other (See Comments)    Caused stomach bleeding.    PHYSICAL EXAM:  Performance status (ECOG): 1 - Symptomatic but completely ambulatory  Vitals:   04/17/20 0902  BP: 109/68  Pulse: 99  Resp: 20  Temp: (!) 97 F (36.1 C)  SpO2: 98%   Wt Readings from Last 3 Encounters:  04/17/20  212 lb 9.6 oz (96.4 kg)  03/27/20 213 lb 12.8 oz (97 kg)  02/14/20 217 lb 9.6 oz (98.7 kg)   Physical Exam Vitals reviewed.  Constitutional:      Appearance: Normal appearance.  Cardiovascular:     Rate and Rhythm: Normal rate and regular rhythm.     Pulses: Normal pulses.     Heart sounds: Normal heart sounds.  Pulmonary:     Effort: Pulmonary effort is normal.     Breath sounds: Normal breath sounds.  Chest:     Comments: Port-a-Cath in R chest Musculoskeletal:     Thoracic back: Bony tenderness (R scapula TTP) present.  Neurological:     General: No focal deficit present.     Mental Status: He is alert and oriented to person, place, and time.  Psychiatric:        Mood and Affect: Mood normal.        Behavior: Behavior normal.     LABORATORY DATA:  I have reviewed the labs as listed.  CBC Latest Ref Rng & Units 04/17/2020 03/27/2020 03/06/2020  WBC 4.0 - 10.5 K/uL 10.4 8.4 10.5  Hemoglobin 13.0 - 17.0 g/dL 11.7(L) 11.8(L) 11.6(L)  Hematocrit 39 - 52 % 37.0(L) 36.6(L) 36.4(L)  Platelets 150 - 400 K/uL 198 220 248   CMP Latest Ref Rng & Units 04/17/2020 03/27/2020 03/06/2020  Glucose 70 - 99 mg/dL 138(H) 161(H) 178(H)  BUN 8 - 23 mg/dL _0 Creatinine 0.61 - 1.24 mg/dL 0.97 1.10 0.97  Sodium 135 - 145 mmol/L 139 136 137  Potassium 3.5 - 5.1 mmol/L 3.7 3.7 4.0  Chloride 98 - 111 mmol/L 104 102 102  CO2 22 - 32 mmol/L 26 25 25  Calcium 8.9 - 10.3 mg/dL 9.1 8.7(L) 8.7(L)  Total Protein 6.5 - 8.1 g/dL 6.1(L) 6.4(L) 6.6  Total Bilirubin 0.3 - 1.2 mg/dL 0.4 0.5 0.4  Alkaline Phos 38 - 126 U/L 80 101 114  AST 15 - 41 U/L 12(L) 13(L) 12(L)  ALT 0 - 44 U/L _0 Lab Results  Component Value Date   PSA     PSA 97.12 03/27/2020   PSA 89.03 03/06/2020    DIAGNOSTIC IMAGING:  I have independently reviewed the scans and discussed with the patient. No results found.   ASSESSMENT:  1. 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 -Abiraterone/prednisone, cabazitaxel with progression. -Guardant 360 showed ATM mutation. -Olaparib 300 mg twice daily from 07/28/2019 through 11/03/2019 with progression. -Bone scan on 11/10/2019 showed worsening disease. PSA was 132 on 11/21/2019. -CT scan on 08/11/2019 showed bone metastasis with no visceral metastasis. -Docetaxel 60 mg per metered square started on 11/21/2019. -Bone scan on 01/23/2020 showed stable osseous metastatic disease. -PSA on 03/06/2020 improved to 89.   PLAN:  1. 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 -He reported that his pain is worse since the weekend.  Pain is in the upper back and right shoulder blade. -We reviewed his labs which showed normal LFTs.  White count and platelets are adequate to proceed with next cycle. -I have arranged for another bone scan prior to next visit in 3 weeks.  2. Bone Metastatic Disease: -Denosumab on hold indefinitely secondary to ONJ.  3. Back Pain: -Continue fentanyl 75 mcg patch. -Hydrocodone 10 mg 5 to 6 tablets/day is not helping. -I have given Dilaudid 2 mg IV which helped.  I will send a prescription for Dilaudid 2 mg every 4 hours as needed for breakthrough.  4. Mild Hypercalcemia: -Calcium today is 9.1 with an albumin of 3.4.  Closely monitor.  5. Elevated Creatinine: -Creatinine improved to 0.97 today.  6. Peripheral neuropathy: -Continue gabapentin at bedtime.   Orders placed this encounter:  No orders of the defined types were placed in this encounter.    Derek Jack, MD [place] [phone]   I, Milinda Antis, am acting as a scribe for Dr. Sanda Linger.  I, Derek Jack MD, have reviewed the above documentation for accuracy and completeness, and I agree with the above.

## 2020-04-17 NOTE — Progress Notes (Signed)
Patient presents today for treatment and follow up visit with Dr. Delton Coombes. MAR reviewed and updated. Labs pending. Patient has no complaints of any significant changes since his last treatment. Patient has complaints of pain today in his right shoulder and spine which he rates a 7/10. Patient states he is using his pain medication but it doesn't help with his pain. Vital signs within parameters for treatment.   Labs within parameters for treatment.   Message received from [place] RN/ Dr. Delton Coombes. Proceed with treatment. Patient's pain uncontrolled, message received to give 2 mg of Dilaudid IV x 1 dose now.   Per Dr. Delton Coombes instant message.  Instruct patient to stop taking Hydrocodone/Acetaminophen 10/325 mg. Dilaudid 2mg  PO for pain will be prescribed today for pain management and sent to pharmacy. Patient teaching performed. Understanding verbalized from patient.   Treatment given today per MD orders. Tolerated infusion without adverse affects. Vital signs stable. No complaints at this time. Discharged from clinic ambulatory. F/U with [facility] as scheduled.

## 2020-04-17 NOTE — Patient Instructions (Signed)
[place] at [facility] Discharge Instructions  You were seen today by Dr. Delton Coombes. He went over your recent results. You will be prescribed hydromorphone for your back pain. Try applying an ice pack to the back. Eat more protein-dense meals or increase your intake of Boost to 3-4 cans daily. You will be scheduled for a scan of your bones. Dr. Delton Coombes will see you back in 3 weeks for labs and follow up.   Thank you for choosing [place] at [facility] to provide your oncology and hematology care.  To afford each patient quality time with our provider, please arrive at least 15 minutes before your scheduled appointment time.   If you have a lab appointment with the [facility] please come in thru the Main Entrance and check in at the main information desk  You need to re-schedule your appointment should you arrive 10 or more minutes late.  We strive to give you quality time with our providers, and arriving late affects you and other patients whose appointments are after yours.  Also, if you no show three or more times for appointments you may be dismissed from the clinic at the providers discretion.     Again, thank you for choosing [facility].  Our hope is that these requests will decrease the amount of time that you wait before being seen by our physicians.       _____________________________________________________________  Should you have questions after your visit to [facility], please contact our office at [phone] between the hours of 8:00 a.m. and 4:30 p.m.  Voicemails left after 4:00 p.m. will not be returned until the following business day.  For prescription refill requests, have your pharmacy contact our office and allow 72 hours.    Cancer Center Support Programs:   > Cancer Support Group  2nd Tuesday of the month 1pm-2pm, Journey Room

## 2020-04-27 ENCOUNTER — Telehealth (HOSPITAL_COMMUNITY): Payer: Self-pay

## 2020-04-27 NOTE — Telephone Encounter (Signed)
Nutrition  Patient identified on Malnutrition Screening report for weight loss and poor appetite.   Called patient. No answer. Left message with call back number.  Raynelle Fujikawa B. Zenia Resides, [place], [place] Registered Dietitian [phone] (mobile)

## 2020-04-30 ENCOUNTER — Other Ambulatory Visit (HOSPITAL_COMMUNITY): Payer: Self-pay | Admitting: *Deleted

## 2020-04-30 ENCOUNTER — Other Ambulatory Visit (HOSPITAL_COMMUNITY): Payer: Self-pay | Admitting: Hematology

## 2020-04-30 DIAGNOSIS — K219 Gastro-esophageal reflux disease without esophagitis: Secondary | ICD-10-CM

## 2020-04-30 DIAGNOSIS — G893 Neoplasm related pain (acute) (chronic): Secondary | ICD-10-CM

## 2020-04-30 DIAGNOSIS — C61 Malignant neoplasm of prostate: Secondary | ICD-10-CM

## 2020-04-30 MED ORDER — FENTANYL 75 MCG/HR TD PT72: 1.0000 | MEDICATED_PATCH | TRANSDERMAL | 0 refills | Status: DC

## 2020-05-07 ENCOUNTER — Encounter (HOSPITAL_COMMUNITY): Payer: Self-pay

## 2020-05-07 ENCOUNTER — Other Ambulatory Visit: Payer: Self-pay

## 2020-05-07 ENCOUNTER — Encounter (HOSPITAL_COMMUNITY)
Admission: RE | Admit: 2020-05-07 | Discharge: 2020-05-07 | Disposition: A | Discharge status: Home / Self Care | Payer: Medicare Other | Source: Ambulatory Visit | Attending: Hematology | Admitting: Hematology

## 2020-05-07 DIAGNOSIS — C61 Malignant neoplasm of prostate: Secondary | ICD-10-CM | POA: Diagnosis not present

## 2020-05-07 MED ORDER — TECHNETIUM TC 99M MEDRONATE IV KIT
20.0000 | PACK | Freq: Once | INTRAVENOUS | Status: AC | PRN
  Administered 2020-05-07: 21.3 via INTRAVENOUS

## 2020-05-09 ENCOUNTER — Inpatient Hospital Stay (HOSPITAL_COMMUNITY): Payer: Medicare Other | Admitting: Hematology

## 2020-05-09 ENCOUNTER — Other Ambulatory Visit: Payer: Self-pay

## 2020-05-09 ENCOUNTER — Inpatient Hospital Stay (HOSPITAL_COMMUNITY): Payer: Medicare Other | Attending: Hematology

## 2020-05-09 ENCOUNTER — Other Ambulatory Visit (HOSPITAL_COMMUNITY): Payer: Self-pay | Admitting: *Deleted

## 2020-05-09 ENCOUNTER — Inpatient Hospital Stay (HOSPITAL_COMMUNITY): Payer: Medicare Other

## 2020-05-09 VITALS — BP 108/69 | HR 108 | Temp 97.0°F | Resp 20 | Wt 209.7 lb

## 2020-05-09 VITALS — BP 94/55 | HR 79 | Temp 96.4°F | Resp 20

## 2020-05-09 DIAGNOSIS — C7951 Secondary malignant neoplasm of bone: Secondary | ICD-10-CM

## 2020-05-09 DIAGNOSIS — C61 Malignant neoplasm of prostate: Secondary | ICD-10-CM

## 2020-05-09 DIAGNOSIS — Z23 Encounter for immunization: Secondary | ICD-10-CM | POA: Diagnosis not present

## 2020-05-09 DIAGNOSIS — Z5111 Encounter for antineoplastic chemotherapy: Secondary | ICD-10-CM | POA: Insufficient documentation

## 2020-05-09 DIAGNOSIS — Z5189 Encounter for other specified aftercare: Secondary | ICD-10-CM | POA: Insufficient documentation

## 2020-05-09 DIAGNOSIS — K219 Gastro-esophageal reflux disease without esophagitis: Secondary | ICD-10-CM

## 2020-05-09 LAB — MAGNESIUM: Magnesium: 1.7 mg/dL (ref 1.7–2.4)

## 2020-05-09 LAB — CBC WITH DIFFERENTIAL/PLATELET
Abs Immature Granulocytes: 0.06 10*3/uL (ref 0.00–0.07)
Basophils Absolute: 0.1 10*3/uL (ref 0.0–0.1)
Basophils Relative: 1 %
Eosinophils Absolute: 0 10*3/uL (ref 0.0–0.5)
Eosinophils Relative: 0 %
HCT: 36.3 % — ABNORMAL LOW (ref 39.0–52.0)
Hemoglobin: 11.7 g/dL — ABNORMAL LOW (ref 13.0–17.0)
Immature Granulocytes: 1 %
Lymphocytes Relative: 7 %
Lymphs Abs: 0.7 10*3/uL (ref 0.7–4.0)
MCH: 31.4 pg (ref 26.0–34.0)
MCHC: 32.2 g/dL (ref 30.0–36.0)
MCV: 97.3 fL (ref 80.0–100.0)
Monocytes Absolute: 0.7 10*3/uL (ref 0.1–1.0)
Monocytes Relative: 6 %
Neutro Abs: 8.9 10*3/uL — ABNORMAL HIGH (ref 1.7–7.7)
Neutrophils Relative %: 85 %
Platelets: 226 10*3/uL (ref 150–400)
RBC: 3.73 MIL/uL — ABNORMAL LOW (ref 4.22–5.81)
RDW: 15.9 % — ABNORMAL HIGH (ref 11.5–15.5)
WBC: 10.3 10*3/uL (ref 4.0–10.5)
nRBC: 0 % (ref 0.0–0.2)

## 2020-05-09 LAB — COMPREHENSIVE METABOLIC PANEL
ALT: 8 U/L (ref 0–44)
AST: 10 U/L — ABNORMAL LOW (ref 15–41)
Albumin: 3.2 g/dL — ABNORMAL LOW (ref 3.5–5.0)
Alkaline Phosphatase: 85 U/L (ref 38–126)
Anion gap: 7 (ref 5–15)
BUN: 8 mg/dL (ref 8–23)
CO2: 29 mmol/L (ref 22–32)
Calcium: 8.7 mg/dL — ABNORMAL LOW (ref 8.9–10.3)
Chloride: 100 mmol/L (ref 98–111)
Creatinine, Ser: 0.88 mg/dL (ref 0.61–1.24)
GFR calc non Af Amer: >60 mL/min (ref >60)
Glucose, Bld: 132 mg/dL — ABNORMAL HIGH (ref 70–99)
Potassium: 3.3 mmol/L — ABNORMAL LOW (ref 3.5–5.1)
Sodium: 136 mmol/L (ref 135–145)
Total Bilirubin: 0.6 mg/dL (ref 0.3–1.2)
Total Protein: 6.1 g/dL — ABNORMAL LOW (ref 6.5–8.1)

## 2020-05-09 LAB — PSA: Prostatic Specific Antigen: 171 ng/mL — ABNORMAL HIGH (ref 0.00–4.00)

## 2020-05-09 MED ORDER — GABAPENTIN 300 MG PO CAPS: 300.0000 mg | ORAL_CAPSULE | Freq: Every day | ORAL | 3 refills | Status: DC

## 2020-05-09 MED ORDER — OMEPRAZOLE 20 MG PO CPDR: 20.0000 mg | DELAYED_RELEASE_CAPSULE | Freq: Two times a day (BID) | ORAL | 2 refills | Status: AC

## 2020-05-09 MED ORDER — SODIUM CHLORIDE 0.9% FLUSH
10.0000 mL | INTRAVENOUS | Status: DC | PRN
  Administered 2020-05-09 (×2): 10 mL

## 2020-05-09 MED ORDER — PALONOSETRON HCL INJECTION 0.25 MG/5ML
0.2500 mg | Freq: Once | INTRAVENOUS | Status: AC
  Administered 2020-05-09: 0.25 mg via INTRAVENOUS

## 2020-05-09 MED ORDER — HYDROCODONE-ACETAMINOPHEN 10-325 MG PO TABS: 1.0000 | ORAL_TABLET | ORAL | 0 refills | Status: DC | PRN

## 2020-05-09 MED ORDER — PEGFILGRASTIM 6 MG/0.6ML ~~LOC~~ PSKT
6.0000 mg | PREFILLED_SYRINGE | Freq: Once | SUBCUTANEOUS | Status: AC
  Administered 2020-05-09: 6 mg via SUBCUTANEOUS

## 2020-05-09 MED ORDER — SODIUM CHLORIDE 0.9 % IV SOLN: Freq: Once | INTRAVENOUS | Status: AC

## 2020-05-09 MED ORDER — POTASSIUM CHLORIDE CRYS ER 20 MEQ PO TBCR
20.0000 meq | EXTENDED_RELEASE_TABLET | Freq: Once | ORAL | Status: AC
  Administered 2020-05-09: 20 meq via ORAL

## 2020-05-09 MED ORDER — HEPARIN SOD (PORK) LOCK FLUSH 100 UNIT/ML IV SOLN
500.0000 [IU] | Freq: Once | INTRAVENOUS | Status: AC | PRN
  Administered 2020-05-09: 500 [IU]

## 2020-05-09 MED ORDER — NALOXEGOL OXALATE 25 MG PO TABS
25.0000 mg | ORAL_TABLET | Freq: Every day | ORAL | Status: DC
  Administered 2020-05-09: 25 mg via ORAL
  Filled 2020-05-09 (×2): qty 1

## 2020-05-09 MED ORDER — PALONOSETRON HCL INJECTION 0.25 MG/5ML
INTRAVENOUS | Status: AC
  Filled 2020-05-09: qty 5

## 2020-05-09 MED ORDER — SODIUM CHLORIDE 0.9 % IV SOLN
60.0000 mg/m2 | Freq: Once | INTRAVENOUS | Status: AC
  Administered 2020-05-09: 140 mg via INTRAVENOUS
  Filled 2020-05-09: qty 14

## 2020-05-09 MED ORDER — POTASSIUM CHLORIDE CRYS ER 20 MEQ PO TBCR
EXTENDED_RELEASE_TABLET | ORAL | Status: AC
  Filled 2020-05-09: qty 1

## 2020-05-09 MED ORDER — SODIUM CHLORIDE 0.9 % IV SOLN
10.0000 mg | Freq: Once | INTRAVENOUS | Status: AC
  Administered 2020-05-09: 10 mg via INTRAVENOUS
  Filled 2020-05-09: qty 10

## 2020-05-09 NOTE — Progress Notes (Signed)
[place], [place] [address]   CLINIC:  Medical Oncology/Hematology  PCP:  [name], [place] / [place] [address] [phone]   REASON FOR VISIT:  Follow-up for metastatic prostate cancer to bone  PRIOR THERAPY:  1. Docetaxel x 6 cycles from 01/07/2016 to 04/21/2016. 2. Olaparib from 07/28/2019 to 11/03/2019 with progression.  NGS Results: Not done  CURRENT THERAPY: Docetaxel and Aloxi every 3 weeks  BRIEF ONCOLOGIC HISTORY:  Oncology History  Prostate carcinoma ([place])  12/10/2015 Imaging   CTA chest, multiple thoracic and rib osseous lesions. no primary evident. Atherosclerosis including aortic and CAD   12/12/2015 Imaging   CT abdomen/pelvis with sclerotic osseous lesions througout the lumbar spine, sacrum, bilateral iliac bones and L proximal femur worrisome for sclerotic osseous mets,mild prostatomegaly, no LAD, Infrarenal 3.8 cm AAA   12/12/2015 Tumor Marker   PSA 153.85   12/18/2015 Imaging   Bone Scan Diffuse bony metastatic disease, posterior calvarium, sternum, thoracic, lumbar spine, bilateral ribs, L shoulder, bilateral bony pelvis, proximal L femur   12/24/2015 Initial Biopsy   CT biopsy of L iliac bone lesion   12/26/2015 Pathology Results   Bone, biopsy, left iliac - POSITIVE FOR METASTATIC ADENOCARCINOMA.   12/28/2015 - 09/01/2016 Chemotherapy   Firmagon instituted 240 mg    01/01/2016 Procedure   Port placed by IR.   01/07/2016 - 04/11/2016 Chemotherapy   Docetaxel every 21 days x 6 cycles with dose reductions due to tolerance   02/18/2016 Adverse Reaction   GI toxicity from docetaxel, dose reduced to 60 mg/m2   02/18/2016 Treatment Plan Change   Docetaxel dose reduced by 20%   03/06/2016 Procedure   Dr. Enrique Sack- Multiple extraction of tooth numbers 3, 5, 6, 8, 9, 10, 11, 12, 20, 21, 22, 27, 28, and 31. 4 Quadrants of alveoloplasty. Bilateral mandibular lingual tori reductions.   04/09/2016  Imaging   Brain MRI 1. No intracranial metastatic disease 2. No acute intracranial abnormality 3. Findings of chronic microvascular disease   05/14/2016 Imaging   CT C/A/P Stable appearing diffuse bony metastatic disease. No new significant disease is seen Innumerable patchy sclerotic osseous metastases throughout the axial and proximal appendicular skeleton, stable in size and distribution, significantly increased in density in the interval, likely reflecting treatment effect. 2. No new or progressive metastatic disease in the chest, abdomen or pelvis. 3. Bilateral perifissural pulmonary nodules are stable and probably benign. 4. Aortic atherosclerosis. Infrarenal 4.3 cm abdominal aortic aneurysm, minimally increased in size. Recommend followup by ultrasound in 1 year.   06/02/2016 Tumor Marker   PSA 0.99 ng/ml    - 07/31/2016 Radiation Therapy   Palliative XRT by Dr. Lianne Cure in [place].    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  The Multi-Gene Panel offered by [facility] includes sequencing and/or deletion duplication testing of the following 85 genes: AIP, ALK, APC, ATM, AXIN2,BAP1,  BARD1, BLM, BMPR1A, BRCA1, BRCA2, BRIP1, CASR, CDC73, CDH1, CDK4, CDKN1B, CDKN1C, CDKN2A (p14ARF), CDKN2A (p16INK4a), CEBPA, CHEK2, CTNNA1, DICER1, DIS3L2, EGFR (c.2369C>T, p.Thr790Met variant only), EPCAM (Deletion/duplication testing only), FH, FLCN, GATA2, GPC3, GREM1 (Promoter region deletion/duplication testing only), HOXB13 (c.251G>A, p.Gly84Glu), HRAS, KIT, MAX, MEN1, MET, MITF (c.952G>A, p.Glu318Lys variant only), MLH1, MSH2, MSH3, MSH6, MUTYH, NBN, NF1, NF2, NTHL1, PALB2, PDGFRA, PHOX2B, PMS2, POLD1, POLE, POT1, PRKAR1A, PTCH1, PTEN, RAD50, RAD51C, RAD51D, RB1,  RECQL4, RET, RNF43, RUNX1, SDHAF2, SDHA (sequence changes only), SDHB, SDHC, SDHD, SMAD4, SMARCA4, SMARCB1,  SMARCE1, STK11, SUFU, TERC, TERT, TMEM127, TP53, TSC1, TSC2, VHL, WRN and WT1.  The report date is August 24, 2018  This is not thought to increase the risk for colon cancer or other cancers, unless there is the presence of two pathogenic mutations.   09/27/2018 - 06/07/2019 Chemotherapy   The patient had palonosetron (ALOXI) injection 0.25 mg, 0.25 mg, Intravenous,  Once, 10 of 10 cycles Administration: 0.25 mg (09/27/2018), 0.25 mg (10/25/2018), 0.25 mg (11/23/2018), 0.25 mg (12/21/2018), 0.25 mg (01/18/2019), 0.25 mg (02/15/2019), 0.25 mg (03/15/2019), 0.25 mg (04/12/2019), 0.25 mg (05/10/2019), 0.25 mg (06/07/2019) pegfilgrastim (NEULASTA ONPRO KIT) injection 6 mg, 6 mg, Subcutaneous, Once, 9 of 9 cycles Administration: 6 mg (10/25/2018), 6 mg (11/23/2018), 6 mg (12/21/2018), 6 mg (01/18/2019), 6 mg (02/15/2019), 6 mg (03/15/2019), 6 mg (04/12/2019), 6 mg (05/10/2019), 6 mg (06/07/2019) pegfilgrastim-cbqv (UDENYCA) injection 6 mg, 6 mg, Subcutaneous, Once, 1 of 1 cycle Administration: 6 mg (09/29/2018) cabazitaxel (JEVTANA) 35 mg in dextrose 5 % 250 mL chemo infusion, 15 mg/m2 = 35 mg (100 % of original dose 15 mg/m2), Intravenous,  Once, 10 of 10 cycles Dose modification: 15 mg/m2 (original dose 15 mg/m2, Cycle 1, Reason: Provider Judgment), 20 mg/m2 (original dose 15 mg/m2, Cycle 2, Reason: Provider Judgment), 20 mg/m2 (original dose 20 mg/m2, Cycle 5, Reason: Patient Age, Comment: Change in jevtana orderable), 15 mg/m2 (75 % of original dose 20 mg/m2, Cycle 8, Reason: Other (see comments), Comment: nausea, worsening numbness) Administration: 35 mg (09/27/2018), 46 mg (10/25/2018), 46 mg (11/23/2018), 46 mg (12/21/2018), 46 mg (01/18/2019), 46 mg (02/15/2019), 46 mg (03/15/2019), 35 mg (04/12/2019), 35 mg (05/10/2019), 35 mg (06/07/2019)  for chemotherapy treatment.    11/21/2019 -  Chemotherapy   The patient had palonosetron (ALOXI) injection 0.25 mg, 0.25 mg, Intravenous,  Once, 8 of 10 cycles Administration: 0.25 mg  (11/21/2019), 0.25 mg (12/12/2019), 0.25 mg (01/03/2020), 0.25 mg (01/24/2020), 0.25 mg (02/14/2020), 0.25 mg (03/06/2020), 0.25 mg (03/27/2020), 0.25 mg (04/17/2020) pegfilgrastim (NEULASTA ONPRO KIT) injection 6 mg, 6 mg, Subcutaneous, Once, 8 of 10 cycles Administration: 6 mg (11/21/2019), 6 mg (12/12/2019), 6 mg (01/03/2020), 6 mg (01/24/2020), 6 mg (02/14/2020), 6 mg (03/06/2020), 6 mg (03/27/2020), 6 mg (04/17/2020) DOCEtaxel (TAXOTERE) 140 mg in sodium chloride 0.9 % 250 mL chemo infusion, 60 mg/m2 = 140 mg (80 % of original dose 75 mg/m2), Intravenous,  Once, 8 of 10 cycles Dose modification: 60 mg/m2 (80 % of original dose 75 mg/m2, Cycle 1, Reason: Other (see comments), Comment: based on previous tolerabiliity in 2017) Administration: 140 mg (11/21/2019), 140 mg (12/12/2019), 140 mg (01/03/2020), 140 mg (01/24/2020), 140 mg (02/14/2020), 140 mg (03/06/2020), 140 mg (03/27/2020), 140 mg (04/17/2020)  for chemotherapy treatment.    Bone metastases ([facility])  12/26/2015 Initial Diagnosis   Bone metastases ([place])   09/27/2018 - 06/07/2019 Chemotherapy   The patient had palonosetron (ALOXI) injection 0.25 mg, 0.25 mg, Intravenous,  Once, 10 of 10 cycles Administration: 0.25 mg (09/27/2018), 0.25 mg (10/25/2018), 0.25 mg (11/23/2018), 0.25 mg (12/21/2018), 0.25 mg (01/18/2019), 0.25 mg (02/15/2019), 0.25 mg (03/15/2019), 0.25 mg (04/12/2019), 0.25 mg (05/10/2019), 0.25 mg (06/07/2019) pegfilgrastim (NEULASTA ONPRO KIT) injection 6 mg, 6 mg, Subcutaneous, Once, 9 of 9 cycles Administration: 6 mg (10/25/2018), 6 mg (11/23/2018), 6 mg (12/21/2018), 6 mg (01/18/2019), 6 mg (02/15/2019), 6 mg (03/15/2019), 6 mg (04/12/2019), 6 mg (05/10/2019), 6 mg (06/07/2019) pegfilgrastim-cbqv (UDENYCA) injection 6 mg, 6 mg,  Subcutaneous, Once, 1 of 1 cycle Administration: 6 mg (09/29/2018) cabazitaxel (JEVTANA) 35 mg in dextrose 5 % 250 mL chemo infusion, 15 mg/m2 = 35 mg (100 % of original dose 15 mg/m2), Intravenous,  Once, 10 of 10 cycles Dose modification: 15 mg/m2  (original dose 15 mg/m2, Cycle 1, Reason: Provider Judgment), 20 mg/m2 (original dose 15 mg/m2, Cycle 2, Reason: Provider Judgment), 20 mg/m2 (original dose 20 mg/m2, Cycle 5, Reason: Patient Age, Comment: Change in jevtana orderable), 15 mg/m2 (75 % of original dose 20 mg/m2, Cycle 8, Reason: Other (see comments), Comment: nausea, worsening numbness) Administration: 35 mg (09/27/2018), 46 mg (10/25/2018), 46 mg (11/23/2018), 46 mg (12/21/2018), 46 mg (01/18/2019), 46 mg (02/15/2019), 46 mg (03/15/2019), 35 mg (04/12/2019), 35 mg (05/10/2019), 35 mg (06/07/2019)  for chemotherapy treatment.    11/21/2019 -  Chemotherapy   The patient had palonosetron (ALOXI) injection 0.25 mg, 0.25 mg, Intravenous,  Once, 8 of 10 cycles Administration: 0.25 mg (11/21/2019), 0.25 mg (12/12/2019), 0.25 mg (01/03/2020), 0.25 mg (01/24/2020), 0.25 mg (02/14/2020), 0.25 mg (03/06/2020), 0.25 mg (03/27/2020), 0.25 mg (04/17/2020) pegfilgrastim (NEULASTA ONPRO KIT) injection 6 mg, 6 mg, Subcutaneous, Once, 8 of 10 cycles Administration: 6 mg (11/21/2019), 6 mg (12/12/2019), 6 mg (01/03/2020), 6 mg (01/24/2020), 6 mg (02/14/2020), 6 mg (03/06/2020), 6 mg (03/27/2020), 6 mg (04/17/2020) DOCEtaxel (TAXOTERE) 140 mg in sodium chloride 0.9 % 250 mL chemo infusion, 60 mg/m2 = 140 mg (80 % of original dose 75 mg/m2), Intravenous,  Once, 8 of 10 cycles Dose modification: 60 mg/m2 (80 % of original dose 75 mg/m2, Cycle 1, Reason: Other (see comments), Comment: based on previous tolerabiliity in 2017) Administration: 140 mg (11/21/2019), 140 mg (12/12/2019), 140 mg (01/03/2020), 140 mg (01/24/2020), 140 mg (02/14/2020), 140 mg (03/06/2020), 140 mg (03/27/2020), 140 mg (04/17/2020)  for chemotherapy treatment.      CANCER STAGING: Cancer Staging Prostate carcinoma [facility]) Staging form: Prostate, AJCC 7th Edition - Clinical stage from 12/24/2015: Stage IV (TX, N0, M1b, PSA: 20 or greater) - Signed by Baird Cancer, PA-C on 01/07/2016   INTERVAL HISTORY:  [name], a 64 y.o. male, returns for routine follow-up and consideration for next cycle of chemotherapy. Arav was last seen on 04/17/2020.  Due for cycle #9 of docetaxel and Aloxi today.   Overall, he tells me he has been feeling okay. He took Dilaudid for 4 days but stopped taking it since it was giving him palpitations, abnormal breathing, nausea and vomiting; he prefers taking the Norco and took only once or twice daily for the past 3 days. He has not had a BM in 1 week and has some pain in his lower abdomen; his usual BM is every 3 days and he continues taking Colace BID along with Miralax. He is drinking water and Gatorade, but is not eating or drinking Ensure since he will vomit it back up.  Overall, he feels ready for next cycle of chemo today.    REVIEW OF SYSTEMS:  Review of Systems  Constitutional: Positive for appetite change (depleted) and fatigue (25%).  HENT:   Positive for trouble swallowing (occasional).   Cardiovascular: Positive for palpitations.  Gastrointestinal: Positive for abdominal pain (lower abdominal pain) and constipation (x 1 week).  Genitourinary: Positive for difficulty urinating (weak stream baseline).   Musculoskeletal: Positive for arthralgias (L shoulder pain) and back pain (2/10 back & hip pain).  Neurological: Positive for dizziness (occasional), headaches (occasional) and numbness (& burning in toes).  All other systems  reviewed and are negative.   PAST MEDICAL/SURGICAL HISTORY:  Past Medical History:  Diagnosis Date   Arthritis    Left knee   Bone cancer ([place])    Bone metastases ([place]) 12/26/2015   Cancer ([place])    Prostate   DVT (deep venous thrombosis) ([facility]) 2004   Family history of prostate cancer    Family history of stomach cancer    Kidney stones    ~2002   Prostate carcinoma ([place]) 12/26/2015   Past Surgical History:  Procedure Laterality Date   CIRCUMCISION  30 years ago   MULTIPLE EXTRACTIONS WITH ALVEOLOPLASTY N/A 03/06/2016     Procedure: Extraction of tooth #'s 3,5,6,8-12,20-22, 27, 28, and 31 with alveoloplasty and bilateral mandibular tori reductions;  Surgeon: Lenn Cal, DDS;  Location: [facility];  Service: Oral Surgery;  Laterality: N/A;    SOCIAL HISTORY:  Social History   Socioeconomic History   Marital status: Single    Spouse name: Not on file   Number of children: 2   Years of education: Not on file   Highest education level: Not on file  Occupational History   Occupation: Construction/carpenter  Tobacco Use   Smoking status: Current Every Day Smoker    Packs/day: 1.00    Years: 50.00    Pack years: 50.00    Types: Cigarettes    Start date: 06/16/1965   Smokeless tobacco: Former Systems developer    Types: Chew    Quit date: 08/04/2005  Substance and Sexual Activity   Alcohol use: No    Alcohol/week: 0.0 standard drinks   Drug use: Yes    Types: Marijuana    Comment: THC for appetite; occ   Sexual activity: Not on file  Other Topics Concern   Not on file  Social History Narrative   Not on file   Social Determinants of Health   Financial Resource Strain:    Difficulty of Paying Living Expenses: Not on file  Food Insecurity:    Worried About Charity fundraiser in the Last Year: Not on file   [employer] of Food in the Last Year: Not on file  Transportation Needs:    Lack of Transportation (Medical): Not on file   Lack of Transportation (Non-Medical): Not on file  Physical Activity:    Days of Exercise per Week: Not on file   Minutes of Exercise per Session: Not on file  Stress:    Feeling of Stress : Not on file  Social Connections:    Frequency of Communication with Friends and Family: Not on file   Frequency of Social Gatherings with Friends and Family: Not on file   Attends Religious Services: Not on file   Active Member of Clubs or Organizations: Not on file   Attends Archivist Meetings: Not on file   Marital Status: Not on file  Intimate  Partner Violence:    Fear of Current or Ex-Partner: Not on file   Emotionally Abused: Not on file   Physically Abused: Not on file   Sexually Abused: Not on file    FAMILY HISTORY:  Family History  Problem Relation Age of Onset   Stomach cancer Mother        dx 52s   Aneurysm Maternal Aunt        brain   Alcohol abuse Maternal Uncle    Stomach cancer Maternal Grandmother        dx 50s-60s   Prostate cancer Maternal Uncle  dx 11s    CURRENT MEDICATIONS:  Current Outpatient Medications  Medication Sig Dispense Refill   DOCEtaxel (TAXOTERE IV) Inject 75 mg/m2 into the vein every 21 ( twenty-one) days.      docusate sodium (COLACE) 100 MG capsule Take 2 capsules daily to help soften the stool.  It this does not work, you can increase to 2 capsules in the morning and 2 capsules in the evening.  If you do not have a bowel movement please call the [facility]. 30 capsule 1   fentaNYL (DURAGESIC) 75 MCG/HR Place 1 patch onto the skin every 3 (three) days. 10 patch 0   gabapentin (NEURONTIN) 300 MG capsule Take 1 capsule (300 mg total) by mouth at bedtime. 30 capsule 3   NARCAN 4 MG/0.1ML LIQD nasal spray kit Place 1 spray into the nose as needed.      omeprazole (PRILOSEC) 20 MG capsule Take 1 capsule by mouth once daily 90 capsule 0   predniSONE (DELTASONE) 5 MG tablet Take 1 tablet (5 mg total) by mouth daily with breakfast. 30 tablet 3   silodosin (RAPAFLO) 8 MG CAPS capsule Take 1 capsule (8 mg total) by mouth daily with breakfast. 30 capsule 11   tamsulosin (FLOMAX) 0.4 MG CAPS capsule Take 1 capsule by mouth at bedtime 30 capsule 4   traZODone (DESYREL) 100 MG tablet Take 1 tablet (100 mg total) by mouth at bedtime as needed for sleep. 30 tablet 3   HYDROmorphone (DILAUDID) 2 MG tablet Take 1 tablet (2 mg total) by mouth every 4 (four) hours as needed for severe pain. (Patient not taking: Reported on 05/09/2020) 84 tablet 0   ondansetron  (ZOFRAN) 8 MG tablet Take every 8 hours as needed (Patient not taking: Reported on 05/09/2020) 30 tablet 6   prochlorperazine (COMPAZINE) 10 MG tablet Take 1 tablet (10 mg total) by mouth every 6 (six) hours as needed for nausea or vomiting. (Patient not taking: Reported on 05/09/2020) 60 tablet 6   No current facility-administered medications for this visit.   Facility-Administered Medications Ordered in Other Visits  Medication Dose Route Frequency Provider Last Rate Last Admin   naloxegol oxalate (MOVANTIK) tablet 25 mg  25 mg Oral Daily Derek Jack, MD       sodium chloride flush (NS) 0.9 % injection 10 mL  10 mL Intracatheter PRN Derek Jack, MD   10 mL at 01/18/19 0815    ALLERGIES:  Allergies  Allergen Reactions   [name] [Denosumab]     Dental issues with previous doses   Meloxicam Other (See Comments)    Caused stomach bleeding.    PHYSICAL EXAM:  Performance status (ECOG): 1 - Symptomatic but completely ambulatory  Vitals:   05/09/20 0924  BP: 108/69  Pulse: (!) 108  Resp: 20  Temp: (!) 97 F (36.1 C)  SpO2: 92%   Wt Readings from Last 3 Encounters:  05/09/20 209 lb 11.2 oz (95.1 kg)  04/17/20 212 lb 9.6 oz (96.4 kg)  03/27/20 213 lb 12.8 oz (97 kg)   Physical Exam Vitals reviewed.  Constitutional:      Appearance: Normal appearance.  Cardiovascular:     Rate and Rhythm: Normal rate and regular rhythm.     Pulses: Normal pulses.     Heart sounds: Normal heart sounds.  Pulmonary:     Effort: Pulmonary effort is normal.     Breath sounds: Normal breath sounds.  Neurological:     General: No focal deficit present.  Mental Status: He is alert and oriented to person, place, and time.  Psychiatric:        Mood and Affect: Mood normal.        Behavior: Behavior normal.     LABORATORY DATA:  I have reviewed the labs as listed.  CBC Latest Ref Rng & Units 05/09/2020 04/17/2020 03/27/2020  WBC 4.0 - 10.5 K/uL 10.3 10.4 8.4  Hemoglobin  13.0 - 17.0 g/dL 11.7(L) 11.7(L) 11.8(L)  Hematocrit 39 - 52 % 36.3(L) 37.0(L) 36.6(L)  Platelets 150 - 400 K/uL 226 198 220   CMP Latest Ref Rng & Units 04/17/2020 03/27/2020 03/06/2020  Glucose 70 - 99 mg/dL 138(H) 161(H) 178(H)  BUN 8 - 23 mg/dL _0 Creatinine 0.61 - 1.24 mg/dL 0.97 1.10 0.97  Sodium 135 - 145 mmol/L 139 136 137  Potassium 3.5 - 5.1 mmol/L 3.7 3.7 4.0  Chloride 98 - 111 mmol/L 104 102 102  CO2 22 - 32 mmol/L _1 Calcium 8.9 - 10.3 mg/dL 9.1 8.7(L) 8.7(L)  Total Protein 6.5 - 8.1 g/dL 6.1(L) 6.4(L) 6.6  Total Bilirubin 0.3 - 1.2 mg/dL 0.4 0.5 0.4  Alkaline Phos 38 - 126 U/L 80 101 114  AST 15 - 41 U/L 12(L) 13(L) 12(L)  ALT 0 - 44 U/L _2 Lab Results  Component Value Date   PSA 0.20 12/10/2016   PSA 0.28 09/29/2016   PSA 0.43 09/01/2016    DIAGNOSTIC IMAGING:  I have independently reviewed the scans and discussed with the patient. NM Bone Scan Whole Body  Result Date: 05/08/2020 CLINICAL DATA:  Prostate carcinoma EXAM: NUCLEAR MEDICINE WHOLE BODY BONE SCAN TECHNIQUE: Whole body anterior and posterior images were obtained approximately 3 hours after intravenous injection of radiopharmaceutical. RADIOPHARMACEUTICALS:  21.3 mCi Technetium-28mMDP IV COMPARISON:  Bone scan January 23, 2020 FINDINGS: Abnormal radiotracer uptake is again noted in the calvarium, most notably in the right parietal bone region but also in the left posterior frontal bone region, stable. Increased uptake in the upper cervical spine region is stable. There are multiple areas of abnormal uptake in the thoracic spine, most notably at T10 and T12. There is also abnormal uptake at L3 and to a lesser extent at L1. Abnormal uptake is again noted in multiple ribs bilaterally, most severe on the left involving the fifth rib. Increased uptake is noted in the lateral left iliac crest, stable. Scattered foci of increased uptake are noted in the right mid humerus region as well as scattered areas of  increased uptake in both femurs, stable. Increased uptake in each proximal tibia is also stable. There is increased uptake in the right acetabulum, likewise stable as well as to a lesser extent along the right lateral iliac crest. Increased uptake is noted in the intertrochanteric region of the right proximal femur. The overall distribution of abnormal radiotracer uptake is stable compared to prior study. Kidneys are noted in the flank positions bilaterally. IMPRESSION: Multifocal abnormal uptake consistent with widespread bony metastatic disease, stable. No new foci of abnormal radiotracer uptake evident compared to prior study. Electronically Signed   By: WLowella GripIII M.D.   On: 05/08/2020 08:03     ASSESSMENT:  1. 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 -Abiraterone/prednisone, cabazitaxel with progression. -Guardant 360 showed ATM mutation. -Olaparib 300 mg twice daily from 07/28/2019 through 11/03/2019 with progression. -Bone scan on 11/10/2019 showed worsening disease.  PSA was 132 on 11/21/2019. -CT scan on 08/11/2019 showed bone metastasis with no visceral metastasis. -Docetaxel 60 mg per metered square started on 11/21/2019. -Bone scan on 01/23/2020 showed stable osseous metastatic disease. -Bone scan on 05/07/2020 shows multifocal abnormal uptake consistent with widespread bony metastatic disease, stable.  No new foci.   PLAN:  1. 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  Uptake areas are stable. -His PSA is bouncing up and down.  However scan is stable.  Hence I have recommended continuing docetaxel. -Reviewed labs which showed normal LFTs and renal function.  Albumin is low at 3.2.  PSA from today is pending. -We will proceed with his next cycle today.  2. Bone Metastatic Disease: -Denosumab  on hold indefinitely secondary to ONJ.  3. Back Pain: -Continue fentanyl 75 mcg patch. -Dilaudid 2 mg made him sleepy and caused abdominal pain and constipation. -We will start him back on hydrocodone 10 mg every 6-8 hours as needed.  4. Mild Hypercalcemia: -Calcium today is 9.1 with an albumin of 3.4.  Closely monitor.  5.  Constipation: -He reported no bowel movement for the past 6 days.  This could be attributed to Dilaudid. -He is taking stool softener 2 tablets twice daily.  Is also using MiraLAX. -We will give him Movantik 25 mg 1 dose.  If no improvement, will consider giving it on a daily basis R and lactulose every 3 hours until BM.  6. Peripheral neuropathy: -Continue gabapentin at bedtime.   Orders placed this encounter:  No orders of the defined types were placed in this encounter.    Derek Jack, MD [place] [phone]   I, Milinda Antis, am acting as a scribe for Dr. Sanda Linger.  I, Derek Jack MD, have reviewed the above documentation for accuracy and completeness, and I agree with the above.

## 2020-05-09 NOTE — Progress Notes (Signed)
Patient reports that he has not had a bowel movement in a week.  Orders received for one dose of Movantik today before he leaves the clinic.

## 2020-05-09 NOTE — Progress Notes (Signed)
Labs within parameter for treatment, K 3.3.  Okay to treat.  4meq of potassium given PO.   tolerated treatment well today without incidence.  Discharge in stable condition ambulatory with a cane.   Marland KitchenNorton Pastel Hallpresents today for neulasta OBI placement per MD orders. OBI device filled per protocol and placed on left Upper Arm. Needle/catheter placement noted prior to patient leaving. Tolerated without incident and aware of injection to be delivered in  27 hours.

## 2020-05-09 NOTE — Patient Instructions (Signed)
[facility] Discharge Instructions for Patients Receiving Chemotherapy  Today you received the following chemotherapy agents   To help prevent nausea and vomiting after your treatment, we encourage you to take your nausea medication   If you develop nausea and vomiting that is not controlled by your nausea medication, call the clinic.   BELOW ARE SYMPTOMS THAT SHOULD BE REPORTED IMMEDIATELY:  *FEVER GREATER THAN 100.5 F  *CHILLS WITH OR WITHOUT FEVER  NAUSEA AND VOMITING THAT IS NOT CONTROLLED WITH YOUR NAUSEA MEDICATION  *UNUSUAL SHORTNESS OF BREATH  *UNUSUAL BRUISING OR BLEEDING  TENDERNESS IN MOUTH AND THROAT WITH OR WITHOUT PRESENCE OF ULCERS  *URINARY PROBLEMS  *BOWEL PROBLEMS  UNUSUAL RASH Items with * indicate a potential emergency and should be followed up as soon as possible.  Feel free to call the clinic should you have any questions or concerns. The clinic phone number is [phone].  Please show the CHEMO ALERT CARD at check-in to the Emergency Department and triage nurse.   

## 2020-05-09 NOTE — Progress Notes (Signed)
Patients port flushed without difficulty.  Good blood return noted with no bruising or swelling noted at site.  Dressing applied.  Patient accessed for treatment.  

## 2020-05-09 NOTE — Patient Instructions (Signed)
[place] at [facility] Discharge Instructions  You were seen today by Dr. Delton Coombes. He went over your recent results and scans. You received your treatment today. Stop taking the Dilaudid and you will be prescribed Norco for your pain. Start taking the omeprazole 20 mg twice daily. You will be given Movantik today to take for your constipation; call the office if you do not get a bowel movement tomorrow. Dr. Delton Coombes will see you back in 3 weeks for labs and follow up.   Thank you for choosing [place] at [facility] to provide your oncology and hematology care.  To afford each patient quality time with our provider, please arrive at least 15 minutes before your scheduled appointment time.   If you have a lab appointment with the [place] please come in thru the Main Entrance and check in at the main information desk  You need to re-schedule your appointment should you arrive 10 or more minutes late.  We strive to give you quality time with our providers, and arriving late affects you and other patients whose appointments are after yours.  Also, if you no show three or more times for appointments you may be dismissed from the clinic at the providers discretion.     Again, thank you for choosing [facility].  Our hope is that these requests will decrease the amount of time that you wait before being seen by our physicians.       _____________________________________________________________  Should you have questions after your visit to [facility], please contact our office at [phone] between the hours of 8:00 a.m. and 4:30 p.m.  Voicemails left after 4:00 p.m. will not be returned until the following business day.  For prescription refill requests, have your pharmacy contact our office and allow 72 hours.    Cancer Center Support Programs:   > Cancer Support Group  2nd Tuesday of the month 1pm-2pm, Journey  Room

## 2020-05-30 ENCOUNTER — Other Ambulatory Visit (HOSPITAL_COMMUNITY): Payer: Self-pay | Admitting: *Deleted

## 2020-05-30 ENCOUNTER — Other Ambulatory Visit: Payer: Self-pay

## 2020-05-30 ENCOUNTER — Inpatient Hospital Stay (HOSPITAL_COMMUNITY): Payer: Medicare Other

## 2020-05-30 ENCOUNTER — Inpatient Hospital Stay (HOSPITAL_BASED_OUTPATIENT_CLINIC_OR_DEPARTMENT_OTHER): Payer: Medicare Other | Admitting: Hematology

## 2020-05-30 VITALS — BP 105/61 | HR 108 | Temp 97.0°F | Resp 18 | Wt 208.1 lb

## 2020-05-30 VITALS — BP 105/67 | HR 87 | Temp 97.5°F | Resp 18

## 2020-05-30 DIAGNOSIS — C61 Malignant neoplasm of prostate: Secondary | ICD-10-CM | POA: Diagnosis not present

## 2020-05-30 DIAGNOSIS — C7951 Secondary malignant neoplasm of bone: Secondary | ICD-10-CM

## 2020-05-30 DIAGNOSIS — Z5189 Encounter for other specified aftercare: Secondary | ICD-10-CM | POA: Diagnosis not present

## 2020-05-30 DIAGNOSIS — Z23 Encounter for immunization: Secondary | ICD-10-CM | POA: Diagnosis not present

## 2020-05-30 DIAGNOSIS — Z5111 Encounter for antineoplastic chemotherapy: Secondary | ICD-10-CM | POA: Diagnosis not present

## 2020-05-30 LAB — CBC WITH DIFFERENTIAL/PLATELET
Abs Immature Granulocytes: 0.06 10*3/uL (ref 0.00–0.07)
Basophils Absolute: 0 10*3/uL (ref 0.0–0.1)
Basophils Relative: 0 %
Eosinophils Absolute: 0 10*3/uL (ref 0.0–0.5)
Eosinophils Relative: 0 %
HCT: 36.6 % — ABNORMAL LOW (ref 39.0–52.0)
Hemoglobin: 11.9 g/dL — ABNORMAL LOW (ref 13.0–17.0)
Immature Granulocytes: 1 %
Lymphocytes Relative: 10 %
Lymphs Abs: 1.1 10*3/uL (ref 0.7–4.0)
MCH: 30.9 pg (ref 26.0–34.0)
MCHC: 32.5 g/dL (ref 30.0–36.0)
MCV: 95.1 fL (ref 80.0–100.0)
Monocytes Absolute: 0.8 10*3/uL (ref 0.1–1.0)
Monocytes Relative: 8 %
Neutro Abs: 8.1 10*3/uL — ABNORMAL HIGH (ref 1.7–7.7)
Neutrophils Relative %: 81 %
Platelets: 228 10*3/uL (ref 150–400)
RBC: 3.85 MIL/uL — ABNORMAL LOW (ref 4.22–5.81)
RDW: 16.7 % — ABNORMAL HIGH (ref 11.5–15.5)
WBC: 10.1 10*3/uL (ref 4.0–10.5)
nRBC: 0 % (ref 0.0–0.2)

## 2020-05-30 LAB — COMPREHENSIVE METABOLIC PANEL
ALT: 8 U/L (ref 0–44)
AST: 11 U/L — ABNORMAL LOW (ref 15–41)
Albumin: 3.2 g/dL — ABNORMAL LOW (ref 3.5–5.0)
Alkaline Phosphatase: 97 U/L (ref 38–126)
Anion gap: 7 (ref 5–15)
BUN: 9 mg/dL (ref 8–23)
CO2: 26 mmol/L (ref 22–32)
Calcium: 8.5 mg/dL — ABNORMAL LOW (ref 8.9–10.3)
Chloride: 101 mmol/L (ref 98–111)
Creatinine, Ser: 0.86 mg/dL (ref 0.61–1.24)
GFR, Estimated: >60 mL/min (ref >60)
Glucose, Bld: 106 mg/dL — ABNORMAL HIGH (ref 70–99)
Potassium: 3.7 mmol/L (ref 3.5–5.1)
Sodium: 134 mmol/L — ABNORMAL LOW (ref 135–145)
Total Bilirubin: 0.4 mg/dL (ref 0.3–1.2)
Total Protein: 6.2 g/dL — ABNORMAL LOW (ref 6.5–8.1)

## 2020-05-30 LAB — MAGNESIUM: Magnesium: 2 mg/dL (ref 1.7–2.4)

## 2020-05-30 LAB — PSA: Prostatic Specific Antigen: 224 ng/mL — ABNORMAL HIGH (ref 0.00–4.00)

## 2020-05-30 MED ORDER — GABAPENTIN 300 MG PO CAPS
300.0000 mg | ORAL_CAPSULE | Freq: Every day | ORAL | 3 refills | Status: DC
Start: 2020-05-30 — End: 2020-07-26

## 2020-05-30 MED ORDER — SODIUM CHLORIDE 0.9% FLUSH
10.0000 mL | INTRAVENOUS | Status: DC | PRN
  Administered 2020-05-30: 10 mL

## 2020-05-30 MED ORDER — SODIUM CHLORIDE 0.9 % IV SOLN: Freq: Once | INTRAVENOUS | Status: AC

## 2020-05-30 MED ORDER — HEPARIN SOD (PORK) LOCK FLUSH 100 UNIT/ML IV SOLN
500.0000 [IU] | Freq: Once | INTRAVENOUS | Status: AC | PRN
  Administered 2020-05-30: 500 [IU]

## 2020-05-30 MED ORDER — SODIUM CHLORIDE 0.9 % IV SOLN
60.0000 mg/m2 | Freq: Once | INTRAVENOUS | Status: AC
  Administered 2020-05-30: 140 mg via INTRAVENOUS
  Filled 2020-05-30: qty 14

## 2020-05-30 MED ORDER — INFLUENZA VAC SPLIT QUAD 0.5 ML IM SUSY
0.5000 mL | PREFILLED_SYRINGE | Freq: Once | INTRAMUSCULAR | Status: AC
  Administered 2020-05-30: 0.5 mL via INTRAMUSCULAR
  Filled 2020-05-30: qty 0.5

## 2020-05-30 MED ORDER — TAMSULOSIN HCL 0.4 MG PO CAPS
0.8000 mg | ORAL_CAPSULE | Freq: Every day | ORAL | 2 refills | Status: DC
Start: 2020-05-30 — End: 2020-10-12

## 2020-05-30 MED ORDER — INFLUENZA VAC SPLIT QUAD 0.5 ML IM SUSY: 0.5000 mL | PREFILLED_SYRINGE | Freq: Once | INTRAMUSCULAR | Status: DC

## 2020-05-30 MED ORDER — SODIUM CHLORIDE 0.9 % IV SOLN
10.0000 mg | Freq: Once | INTRAVENOUS | Status: AC
  Administered 2020-05-30: 10 mg via INTRAVENOUS
  Filled 2020-05-30: qty 10

## 2020-05-30 MED ORDER — PEGFILGRASTIM 6 MG/0.6ML ~~LOC~~ PSKT
6.0000 mg | PREFILLED_SYRINGE | Freq: Once | SUBCUTANEOUS | Status: AC
  Administered 2020-05-30: 6 mg via SUBCUTANEOUS
  Filled 2020-05-30: qty 0.6

## 2020-05-30 MED ORDER — PALONOSETRON HCL INJECTION 0.25 MG/5ML
0.2500 mg | Freq: Once | INTRAVENOUS | Status: AC
  Administered 2020-05-30: 0.25 mg via INTRAVENOUS
  Filled 2020-05-30: qty 5

## 2020-05-30 NOTE — Progress Notes (Signed)
Patient was assessed by Dr. Delton Coombes and labs have been reviewed.  PSA increased to 171, bone scan did not show anything.  Patient is okay to proceed with treatment today. We will repeat CT scans in 3 weeks. Primary RN and pharmacy aware.

## 2020-05-30 NOTE — Progress Notes (Signed)
[id number] Labs reviewed with and pt seen by Dr. Delton Coombes and pt approved for Taxotere infusion today per MD                                 Leonette Most tolerated Taxotere infusion with Neulasta on-pro and Influenza vaccine well without complaints or incident. Pt discharged with Neulasta on-pro intact to left arm with green light blinking. VSS upon discharge. Pt discharged self ambulatory using his cane in satisfactory condition

## 2020-05-30 NOTE — Patient Instructions (Signed)
[facility] at [facility] Discharge Instructions  Labs drawn from portacath today   Thank you for choosing [facility] at [facility] to provide your oncology and hematology care.  To afford each patient quality time with our provider, please arrive at least 15 minutes before your scheduled appointment time.   If you have a lab appointment with the Cancer Center please come in thru the Main Entrance and check in at the main information desk.  You need to re-schedule your appointment should you arrive 10 or more minutes late.  We strive to give you quality time with our providers, and arriving late affects you and other patients whose appointments are after yours.  Also, if you no show three or more times for appointments you may be dismissed from the clinic at the providers discretion.     Again, thank you for choosing [facility].  Our hope is that these requests will decrease the amount of time that you wait before being seen by our physicians.       _____________________________________________________________  Should you have questions after your visit to [facility], please contact our office at [phone] and follow the prompts.  Our office hours are 8:00 a.m. and 4:30 p.m. Monday - Friday.  Please note that voicemails left after 4:00 p.m. may not be returned until the following business day.  We are closed weekends and major holidays.  You do have access to a nurse 24-7, just call the main number to the clinic [phone] and do not press any options, hold on the line and a nurse will answer the phone.    For prescription refill requests, have your pharmacy contact our office and allow 72 hours.    Due to Covid, you will need to wear a mask upon entering the hospital. If you do not have a mask, a mask will be given to you at the Main Entrance upon arrival. For doctor visits, patients may have 1 support person age 18  or older with them. For treatment visits, patients can not have anyone with them due to social distancing guidelines and our immunocompromised population.     

## 2020-05-30 NOTE — Progress Notes (Signed)
[place], [place] [address]   CLINIC:  Medical Oncology/Hematology  PCP:  [name], [place] / [place] [address] [phone]   REASON FOR VISIT:  Follow-up for metastatic prostate cancer to bone  PRIOR THERAPY:  1. Docetaxel x 6 cycles from 01/07/2016 to 04/21/2016. 2. Olaparib from 07/28/2019 to 11/03/2019 with progression.  NGS Results: Not done  CURRENT THERAPY: Docetaxel and Aloxi every 3 weeks  BRIEF ONCOLOGIC HISTORY:  Oncology History  Prostate carcinoma ([place])  12/10/2015 Imaging   CTA chest, multiple thoracic and rib osseous lesions. no primary evident. Atherosclerosis including aortic and CAD   12/12/2015 Imaging   CT abdomen/pelvis with sclerotic osseous lesions througout the lumbar spine, sacrum, bilateral iliac bones and L proximal femur worrisome for sclerotic osseous mets,mild prostatomegaly, no LAD, Infrarenal 3.8 cm AAA   12/12/2015 Tumor Marker   PSA 153.85   12/18/2015 Imaging   Bone Scan Diffuse bony metastatic disease, posterior calvarium, sternum, thoracic, lumbar spine, bilateral ribs, L shoulder, bilateral bony pelvis, proximal L femur   12/24/2015 Initial Biopsy   CT biopsy of L iliac bone lesion   12/26/2015 Pathology Results   Bone, biopsy, left iliac - POSITIVE FOR METASTATIC ADENOCARCINOMA.   12/28/2015 - 09/01/2016 Chemotherapy   Firmagon instituted 240 mg    01/01/2016 Procedure   Port placed by IR.   01/07/2016 - 04/11/2016 Chemotherapy   Docetaxel every 21 days x 6 cycles with dose reductions due to tolerance   02/18/2016 Adverse Reaction   GI toxicity from docetaxel, dose reduced to 60 mg/m2   02/18/2016 Treatment Plan Change   Docetaxel dose reduced by 20%   03/06/2016 Procedure   Dr. Enrique Sack- Multiple extraction of tooth numbers 3, 5, 6, 8, 9, 10, 11, 12, 20, 21, 22, 27, 28, and 31. 4 Quadrants of alveoloplasty. Bilateral mandibular lingual tori reductions.   04/09/2016  Imaging   Brain MRI 1. No intracranial metastatic disease 2. No acute intracranial abnormality 3. Findings of chronic microvascular disease   05/14/2016 Imaging   CT C/A/P Stable appearing diffuse bony metastatic disease. No new significant disease is seen Innumerable patchy sclerotic osseous metastases throughout the axial and proximal appendicular skeleton, stable in size and distribution, significantly increased in density in the interval, likely reflecting treatment effect. 2. No new or progressive metastatic disease in the chest, abdomen or pelvis. 3. Bilateral perifissural pulmonary nodules are stable and probably benign. 4. Aortic atherosclerosis. Infrarenal 4.3 cm abdominal aortic aneurysm, minimally increased in size. Recommend followup by ultrasound in 1 year.   06/02/2016 Tumor Marker   PSA 0.99 ng/ml    - 07/31/2016 Radiation Therapy   Palliative XRT by Dr. Lianne Cure in [place].    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  The Multi-Gene Panel offered by [facility] includes sequencing and/or deletion duplication testing of the following 85 genes: AIP, ALK, APC, ATM, AXIN2,BAP1,  BARD1, BLM, BMPR1A, BRCA1, BRCA2, BRIP1, CASR, CDC73, CDH1, CDK4, CDKN1B, CDKN1C, CDKN2A (p14ARF), CDKN2A (p16INK4a), CEBPA, CHEK2, CTNNA1, DICER1, DIS3L2, EGFR (c.2369C>T, p.Thr790Met variant only), EPCAM (Deletion/duplication testing only), FH, FLCN, GATA2, GPC3, GREM1 (Promoter region deletion/duplication testing only), HOXB13 (c.251G>A, p.Gly84Glu), HRAS, KIT, MAX, MEN1, MET, MITF (c.952G>A, p.Glu318Lys variant only), MLH1, MSH2, MSH3, MSH6, MUTYH, NBN, NF1, NF2, NTHL1, PALB2, PDGFRA, PHOX2B, PMS2, POLD1, POLE, POT1, PRKAR1A, PTCH1, PTEN, RAD50, RAD51C, RAD51D, RB1,  RECQL4, RET, RNF43, RUNX1, SDHAF2, SDHA (sequence changes only), SDHB, SDHC, SDHD, SMAD4, SMARCA4, SMARCB1,  SMARCE1, STK11, SUFU, TERC, TERT, TMEM127, TP53, TSC1, TSC2, VHL, WRN and WT1.  The report date is August 24, 2018  This is not thought to increase the risk for colon cancer or other cancers, unless there is the presence of two pathogenic mutations.   09/27/2018 - 06/07/2019 Chemotherapy   The patient had palonosetron (ALOXI) injection 0.25 mg, 0.25 mg, Intravenous,  Once, 10 of 10 cycles Administration: 0.25 mg (09/27/2018), 0.25 mg (10/25/2018), 0.25 mg (11/23/2018), 0.25 mg (12/21/2018), 0.25 mg (01/18/2019), 0.25 mg (02/15/2019), 0.25 mg (03/15/2019), 0.25 mg (04/12/2019), 0.25 mg (05/10/2019), 0.25 mg (06/07/2019) pegfilgrastim (NEULASTA ONPRO KIT) injection 6 mg, 6 mg, Subcutaneous, Once, 9 of 9 cycles Administration: 6 mg (10/25/2018), 6 mg (11/23/2018), 6 mg (12/21/2018), 6 mg (01/18/2019), 6 mg (02/15/2019), 6 mg (03/15/2019), 6 mg (04/12/2019), 6 mg (05/10/2019), 6 mg (06/07/2019) pegfilgrastim-cbqv (UDENYCA) injection 6 mg, 6 mg, Subcutaneous, Once, 1 of 1 cycle Administration: 6 mg (09/29/2018) cabazitaxel (JEVTANA) 35 mg in dextrose 5 % 250 mL chemo infusion, 15 mg/m2 = 35 mg (100 % of original dose 15 mg/m2), Intravenous,  Once, 10 of 10 cycles Dose modification: 15 mg/m2 (original dose 15 mg/m2, Cycle 1, Reason: Provider Judgment), 20 mg/m2 (original dose 15 mg/m2, Cycle 2, Reason: Provider Judgment), 20 mg/m2 (original dose 20 mg/m2, Cycle 5, Reason: Patient Age, Comment: Change in jevtana orderable), 15 mg/m2 (75 % of original dose 20 mg/m2, Cycle 8, Reason: Other (see comments), Comment: nausea, worsening numbness) Administration: 35 mg (09/27/2018), 46 mg (10/25/2018), 46 mg (11/23/2018), 46 mg (12/21/2018), 46 mg (01/18/2019), 46 mg (02/15/2019), 46 mg (03/15/2019), 35 mg (04/12/2019), 35 mg (05/10/2019), 35 mg (06/07/2019)  for chemotherapy treatment.    11/21/2019 -  Chemotherapy   The patient had palonosetron (ALOXI) injection 0.25 mg, 0.25 mg, Intravenous,  Once, 10 of 12 cycles Administration: 0.25 mg  (11/21/2019), 0.25 mg (12/12/2019), 0.25 mg (01/03/2020), 0.25 mg (01/24/2020), 0.25 mg (02/14/2020), 0.25 mg (03/06/2020), 0.25 mg (03/27/2020), 0.25 mg (04/17/2020), 0.25 mg (05/09/2020), 0.25 mg (05/30/2020) pegfilgrastim (NEULASTA ONPRO KIT) injection 6 mg, 6 mg, Subcutaneous, Once, 10 of 12 cycles Administration: 6 mg (11/21/2019), 6 mg (12/12/2019), 6 mg (01/03/2020), 6 mg (01/24/2020), 6 mg (02/14/2020), 6 mg (03/06/2020), 6 mg (03/27/2020), 6 mg (04/17/2020), 6 mg (05/09/2020), 6 mg (05/30/2020) DOCEtaxel (TAXOTERE) 140 mg in sodium chloride 0.9 % 250 mL chemo infusion, 60 mg/m2 = 140 mg (80 % of original dose 75 mg/m2), Intravenous,  Once, 10 of 12 cycles Dose modification: 60 mg/m2 (80 % of original dose 75 mg/m2, Cycle 1, Reason: Other (see comments), Comment: based on previous tolerabiliity in 2017) Administration: 140 mg (11/21/2019), 140 mg (12/12/2019), 140 mg (01/03/2020), 140 mg (01/24/2020), 140 mg (02/14/2020), 140 mg (03/06/2020), 140 mg (03/27/2020), 140 mg (04/17/2020), 140 mg (05/09/2020), 140 mg (05/30/2020)  for chemotherapy treatment.    Bone metastases ([place])  12/26/2015 Initial Diagnosis   Bone metastases ([place])   09/27/2018 - 06/07/2019 Chemotherapy   The patient had palonosetron (ALOXI) injection 0.25 mg, 0.25 mg, Intravenous,  Once, 10 of 10 cycles Administration: 0.25 mg (09/27/2018), 0.25 mg (10/25/2018), 0.25 mg (11/23/2018), 0.25 mg (12/21/2018), 0.25 mg (01/18/2019), 0.25 mg (02/15/2019), 0.25 mg (03/15/2019), 0.25 mg (04/12/2019), 0.25 mg (05/10/2019), 0.25 mg (06/07/2019) pegfilgrastim (NEULASTA ONPRO KIT) injection 6 mg, 6 mg, Subcutaneous, Once, 9 of 9 cycles Administration: 6 mg (10/25/2018), 6 mg (11/23/2018), 6 mg (12/21/2018), 6 mg (01/18/2019), 6 mg (02/15/2019), 6  mg (03/15/2019), 6 mg (04/12/2019), 6 mg (05/10/2019), 6 mg (06/07/2019) pegfilgrastim-cbqv (UDENYCA) injection 6 mg, 6 mg, Subcutaneous, Once, 1 of 1 cycle Administration: 6 mg (09/29/2018) cabazitaxel (JEVTANA) 35 mg in dextrose 5 % 250 mL chemo  infusion, 15 mg/m2 = 35 mg (100 % of original dose 15 mg/m2), Intravenous,  Once, 10 of 10 cycles Dose modification: 15 mg/m2 (original dose 15 mg/m2, Cycle 1, Reason: Provider Judgment), 20 mg/m2 (original dose 15 mg/m2, Cycle 2, Reason: Provider Judgment), 20 mg/m2 (original dose 20 mg/m2, Cycle 5, Reason: Patient Age, Comment: Change in jevtana orderable), 15 mg/m2 (75 % of original dose 20 mg/m2, Cycle 8, Reason: Other (see comments), Comment: nausea, worsening numbness) Administration: 35 mg (09/27/2018), 46 mg (10/25/2018), 46 mg (11/23/2018), 46 mg (12/21/2018), 46 mg (01/18/2019), 46 mg (02/15/2019), 46 mg (03/15/2019), 35 mg (04/12/2019), 35 mg (05/10/2019), 35 mg (06/07/2019)  for chemotherapy treatment.    11/21/2019 -  Chemotherapy   The patient had palonosetron (ALOXI) injection 0.25 mg, 0.25 mg, Intravenous,  Once, 10 of 12 cycles Administration: 0.25 mg (11/21/2019), 0.25 mg (12/12/2019), 0.25 mg (01/03/2020), 0.25 mg (01/24/2020), 0.25 mg (02/14/2020), 0.25 mg (03/06/2020), 0.25 mg (03/27/2020), 0.25 mg (04/17/2020), 0.25 mg (05/09/2020), 0.25 mg (05/30/2020) pegfilgrastim (NEULASTA ONPRO KIT) injection 6 mg, 6 mg, Subcutaneous, Once, 10 of 12 cycles Administration: 6 mg (11/21/2019), 6 mg (12/12/2019), 6 mg (01/03/2020), 6 mg (01/24/2020), 6 mg (02/14/2020), 6 mg (03/06/2020), 6 mg (03/27/2020), 6 mg (04/17/2020), 6 mg (05/09/2020), 6 mg (05/30/2020) DOCEtaxel (TAXOTERE) 140 mg in sodium chloride 0.9 % 250 mL chemo infusion, 60 mg/m2 = 140 mg (80 % of original dose 75 mg/m2), Intravenous,  Once, 10 of 12 cycles Dose modification: 60 mg/m2 (80 % of original dose 75 mg/m2, Cycle 1, Reason: Other (see comments), Comment: based on previous tolerabiliity in 2017) Administration: 140 mg (11/21/2019), 140 mg (12/12/2019), 140 mg (01/03/2020), 140 mg (01/24/2020), 140 mg (02/14/2020), 140 mg (03/06/2020), 140 mg (03/27/2020), 140 mg (04/17/2020), 140 mg (05/09/2020), 140 mg (05/30/2020)  for chemotherapy treatment.      CANCER  STAGING: Cancer Staging Prostate carcinoma [facility]) Staging form: Prostate, AJCC 7th Edition - Clinical stage from 12/24/2015: Stage IV (TX, N0, M1b, PSA: 20 or greater) - Signed by Baird Cancer, PA-C on 01/07/2016   INTERVAL HISTORY:  [name], a 64 y.o. male, returns for routine follow-up and consideration for next cycle of chemotherapy.  Due for cycle #10 of docetaxel and Aloxi today.   He reports that he tolerated last cycle reasonably well.  His pain is rated as 3 out of 10 today.  Reports occasional nausea.  Appetite is low.  Energy levels are 50%.  Denies any worsening or new onset numbness in the extremities.  Requests refill for Flomax and gabapentin.  Overall, he feels ready for next cycle of chemo today.    REVIEW OF SYSTEMS:  Review of Systems  Constitutional: Negative for appetite change (depleted) and fatigue (25%).  HENT:   Positive for trouble swallowing (occasional).   Gastrointestinal: Positive for nausea. Negative for abdominal pain (lower abdominal pain) and constipation (x 1 week).  Genitourinary: Difficulty urinating: weak stream baseline.   Musculoskeletal: Positive for arthralgias and back pain (2/10 back & hip pain).  Neurological: Positive for dizziness (occasional) and headaches (occasional). Negative for numbness (& burning in toes).  All other systems reviewed and are negative.   PAST MEDICAL/SURGICAL HISTORY:  Past Medical History:  Diagnosis Date   Arthritis    Left knee  Bone cancer ([place])    Bone metastases ([place]) 12/26/2015   Cancer ([place])    Prostate   DVT (deep venous thrombosis) ([place]) 2004   Family history of prostate cancer    Family history of stomach cancer    Kidney stones    ~2002   Prostate carcinoma ([place]) 12/26/2015   Past Surgical History:  Procedure Laterality Date   CIRCUMCISION  30 years ago   MULTIPLE EXTRACTIONS WITH ALVEOLOPLASTY N/A 03/06/2016   Procedure: Extraction of tooth #'s 3,5,6,8-12,20-22, 27, 28,  and 31 with alveoloplasty and bilateral mandibular tori reductions;  Surgeon: Lenn Cal, DDS;  Location: [facility];  Service: Oral Surgery;  Laterality: N/A;    SOCIAL HISTORY:  Social History   Socioeconomic History   Marital status: Single    Spouse name: Not on file   Number of children: 2   Years of education: Not on file   Highest education level: Not on file  Occupational History   Occupation: Construction/carpenter  Tobacco Use   Smoking status: Current Every Day Smoker    Packs/day: 1.00    Years: 50.00    Pack years: 50.00    Types: Cigarettes    Start date: 06/16/1965   Smokeless tobacco: Former Systems developer    Types: Chew    Quit date: 08/04/2005  Substance and Sexual Activity   Alcohol use: No    Alcohol/week: 0.0 standard drinks   Drug use: Yes    Types: Marijuana    Comment: THC for appetite; occ   Sexual activity: Not on file  Other Topics Concern   Not on file  Social History Narrative   Not on file   Social Determinants of Health   Financial Resource Strain:    Difficulty of Paying Living Expenses: Not on file  Food Insecurity:    Worried About Charity fundraiser in the Last Year: Not on file   [employer] of Food in the Last Year: Not on file  Transportation Needs:    Lack of Transportation (Medical): Not on file   Lack of Transportation (Non-Medical): Not on file  Physical Activity:    Days of Exercise per Week: Not on file   Minutes of Exercise per Session: Not on file  Stress:    Feeling of Stress : Not on file  Social Connections:    Frequency of Communication with Friends and Family: Not on file   Frequency of Social Gatherings with Friends and Family: Not on file   Attends Religious Services: Not on file   Active Member of Clubs or Organizations: Not on file   Attends Archivist Meetings: Not on file   Marital Status: Not on file  Intimate Partner Violence:    Fear of Current or Ex-Partner: Not on file    Emotionally Abused: Not on file   Physically Abused: Not on file   Sexually Abused: Not on file    FAMILY HISTORY:  Family History  Problem Relation Age of Onset   Stomach cancer Mother        dx 52s   Aneurysm Maternal Aunt        brain   Alcohol abuse Maternal Uncle    Stomach cancer Maternal Grandmother        dx 50s-60s   Prostate cancer Maternal Uncle        dx 49s    CURRENT MEDICATIONS:  Current Outpatient Medications  Medication Sig Dispense Refill   DOCEtaxel (TAXOTERE IV) Inject 75 mg/m2  into the vein every 21 ( twenty-one) days.      docusate sodium (COLACE) 100 MG capsule Take 2 capsules daily to help soften the stool.  It this does not work, you can increase to 2 capsules in the morning and 2 capsules in the evening.  If you do not have a bowel movement please call the [facility]. 30 capsule 1   fentaNYL (DURAGESIC) 75 MCG/HR Place 1 patch onto the skin every 3 (three) days. 10 patch 0   HYDROcodone-acetaminophen (NORCO) 10-325 MG tablet Take 1 tablet by mouth every 4 (four) hours as needed. 180 tablet 0   HYDROmorphone (DILAUDID) 2 MG tablet Take 1 tablet (2 mg total) by mouth every 4 (four) hours as needed for severe pain. 84 tablet 0   NARCAN 4 MG/0.1ML LIQD nasal spray kit Place 1 spray into the nose as needed.      omeprazole (PRILOSEC) 20 MG capsule Take 1 capsule (20 mg total) by mouth 2 (two) times daily. 60 capsule 2   ondansetron (ZOFRAN) 8 MG tablet Take every 8 hours as needed 30 tablet 6   predniSONE (DELTASONE) 5 MG tablet Take 1 tablet (5 mg total) by mouth daily with breakfast. 30 tablet 3   silodosin (RAPAFLO) 8 MG CAPS capsule Take 1 capsule (8 mg total) by mouth daily with breakfast. 30 capsule 11   traZODone (DESYREL) 100 MG tablet Take 1 tablet (100 mg total) by mouth at bedtime as needed for sleep. 30 tablet 3   gabapentin (NEURONTIN) 300 MG capsule Take 1 capsule (300 mg total) by mouth at bedtime. 30 capsule 3    prochlorperazine (COMPAZINE) 10 MG tablet Take 1 tablet (10 mg total) by mouth every 6 (six) hours as needed for nausea or vomiting. (Patient not taking: Reported on 05/30/2020) 60 tablet 6   tamsulosin (FLOMAX) 0.4 MG CAPS capsule Take 2 capsules (0.8 mg total) by mouth at bedtime. 60 capsule 2   Current Facility-Administered Medications  Medication Dose Route Frequency Provider Last Rate Last Admin   influenza vac split quadrivalent PF (FLUARIX) injection 0.5 mL  0.5 mL Intramuscular Once Derek Jack, MD       Facility-Administered Medications Ordered in Other Visits  Medication Dose Route Frequency Provider Last Rate Last Admin   sodium chloride flush (NS) 0.9 % injection 10 mL  10 mL Intracatheter PRN Derek Jack, MD   10 mL at 01/18/19 0815    ALLERGIES:  Allergies  Allergen Reactions   [name] [Denosumab]     Dental issues with previous doses   Meloxicam Other (See Comments)    Caused stomach bleeding.    PHYSICAL EXAM:  Performance status (ECOG): 1 - Symptomatic but completely ambulatory  Vitals:   05/30/20 0919  BP: 105/61  Pulse: (!) 108  Resp: 18  Temp: (!) 97 F (36.1 C)  SpO2: 97%   Wt Readings from Last 3 Encounters:  05/30/20 208 lb 1.6 oz (94.4 kg)  05/09/20 209 lb 11.2 oz (95.1 kg)  04/17/20 212 lb 9.6 oz (96.4 kg)   Physical Exam Vitals reviewed.  Constitutional:      Appearance: Normal appearance.  Cardiovascular:     Rate and Rhythm: Normal rate and regular rhythm.     Pulses: Normal pulses.     Heart sounds: Normal heart sounds.  Pulmonary:     Effort: Pulmonary effort is normal.     Breath sounds: Normal breath sounds.  Abdominal:     General: There is  no distension.     Palpations: Abdomen is soft.  Neurological:     General: No focal deficit present.     Mental Status: He is alert and oriented to person, place, and time.  Psychiatric:        Mood and Affect: Mood normal.        Behavior: Behavior normal.      LABORATORY DATA:  I have reviewed the labs as listed.  CBC Latest Ref Rng & Units 05/30/2020 05/09/2020 04/17/2020  WBC 4.0 - 10.5 K/uL 10.1 10.3 10.4  Hemoglobin 13.0 - 17.0 g/dL 11.9(L) 11.7(L) 11.7(L)  Hematocrit 39 - 52 % 36.6(L) 36.3(L) 37.0(L)  Platelets 150 - 400 K/uL 228 226 198   CMP Latest Ref Rng & Units 05/30/2020 05/09/2020 04/17/2020  Glucose 70 - 99 mg/dL 106(H) 132(H) 138(H)  BUN 8 - 23 mg/dL $Remove'9 8 16  'OvtQImH$ Creatinine 0.61 - 1.24 mg/dL 0.86 0.88 0.97  Sodium 135 - 145 mmol/L 134(L) 136 139  Potassium 3.5 - 5.1 mmol/L 3.7 3.3(L) 3.7  Chloride 98 - 111 mmol/L 101 100 104  CO2 22 - 32 mmol/L $RemoveB'26 29 26  'YieJNRHH$ Calcium 8.9 - 10.3 mg/dL 8.5(L) 8.7(L) 9.1  Total Protein 6.5 - 8.1 g/dL 6.2(L) 6.1(L) 6.1(L)  Total Bilirubin 0.3 - 1.2 mg/dL 0.4 0.6 0.4  Alkaline Phos 38 - 126 U/L 97 85 80  AST 15 - 41 U/L 11(L) 10(L) 12(L)  ALT 0 - 44 U/L $Remo'8 8 8   'rUjpL$ Lab Results  Component Value Date   PSA 0.20 12/10/2016   PSA 0.28 09/29/2016   PSA 0.43 09/01/2016    DIAGNOSTIC IMAGING:  I have independently reviewed the scans and discussed with the patient. NM Bone Scan Whole Body  Result Date: 05/08/2020 CLINICAL DATA:  Prostate carcinoma EXAM: NUCLEAR MEDICINE WHOLE BODY BONE SCAN TECHNIQUE: Whole body anterior and posterior images were obtained approximately 3 hours after intravenous injection of radiopharmaceutical. RADIOPHARMACEUTICALS:  21.3 mCi Technetium-15m MDP IV COMPARISON:  Bone scan January 23, 2020 FINDINGS: Abnormal radiotracer uptake is again noted in the calvarium, most notably in the right parietal bone region but also in the left posterior frontal bone region, stable. Increased uptake in the upper cervical spine region is stable. There are multiple areas of abnormal uptake in the thoracic spine, most notably at T10 and T12. There is also abnormal uptake at L3 and to a lesser extent at L1. Abnormal uptake is again noted in multiple ribs bilaterally, most severe on the left involving the  fifth rib. Increased uptake is noted in the lateral left iliac crest, stable. Scattered foci of increased uptake are noted in the right mid humerus region as well as scattered areas of increased uptake in both femurs, stable. Increased uptake in each proximal tibia is also stable. There is increased uptake in the right acetabulum, likewise stable as well as to a lesser extent along the right lateral iliac crest. Increased uptake is noted in the intertrochanteric region of the right proximal femur. The overall distribution of abnormal radiotracer uptake is stable compared to prior study. Kidneys are noted in the flank positions bilaterally. IMPRESSION: Multifocal abnormal uptake consistent with widespread bony metastatic disease, stable. No new foci of abnormal radiotracer uptake evident compared to prior study. Electronically Signed   By: Lowella Grip III M.D.   On: 05/08/2020 08:03     ASSESSMENT:  1. 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 -Abiraterone/prednisone, cabazitaxel  with progression. -Guardant 360 showed ATM mutation. -Olaparib 300 mg twice daily from 07/28/2019 through 11/03/2019 with progression. -Bone scan on 11/10/2019 showed worsening disease. PSA was 132 on 11/21/2019. -CT scan on 08/11/2019 showed bone metastasis with no visceral metastasis. -Docetaxel 60 mg per metered square started on 11/21/2019. -Bone scan on 01/23/2020 showed stable osseous metastatic disease. -Bone scan on 05/07/2020 shows multifocal abnormal uptake consistent with widespread bony metastatic disease, stable.  No new foci.   PLAN:  1. 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  His last PSA went up to 170.  LFTs are normal with a low albumin of 3.2.  Creatinine is normal. -He will proceed with his next cycle today.  Because of the PSA going up, I have recommended CT of the  abdomen and pelvis in 3 weeks.  His bone scan did not show any progression 3 weeks ago.  2. Bone Metastatic Disease: -Denosumab on hold indefinitely secondary to ONJ.  3. Back Pain: -Continue fentanyl 75 mcg patch. -Continue hydrocodone 10 mg every 4-6 hours as needed.  4. Mild Hypercalcemia: -Calcium is 8.5 with albumin of 3.2.  Closely monitor.  5.  Constipation: -He had a bowel movement after a dose of Movantik at last visit.  He is taking stool softeners and MiraLAX. -He is back to his baseline of 1 bowel movement every 3 days.  6. Peripheral neuropathy: -Continue gabapentin at bedtime.   Orders placed this encounter:  Orders Placed This Encounter  Procedures   CT Abdomen Pelvis Harper, MD [place] [phone]   I, Milinda Antis, am acting as a scribe for Dr. Sanda Linger.  I, Derek Jack MD, have reviewed the above documentation for accuracy and completeness, and I agree with the above.

## 2020-05-30 NOTE — Patient Instructions (Addendum)
[facility] Discharge Instructions for Patients Receiving Chemotherapy   Beginning January 23rd 2017 lab work for the [facility] will be done in the  Main lab at [facility] on 1st floor. If you have a lab appointment with the [facility] please come in thru the  Main Entrance and check in at the main information desk   Today you received the following chemotherapy agents Taxotere as well as Neulasta on-pro and Influenza vaccine. Follow-up as scheduled  To help prevent nausea and vomiting after your treatment, we encourage you to take your nausea medication   If you develop nausea and vomiting, or diarrhea that is not controlled by your medication, call the clinic.  The clinic phone number is [phone]. Office hours are Monday-Friday 8:30am-5:00pm.  BELOW ARE SYMPTOMS THAT SHOULD BE REPORTED IMMEDIATELY:  *FEVER GREATER THAN 101.0 F  *CHILLS WITH OR WITHOUT FEVER  NAUSEA AND VOMITING THAT IS NOT CONTROLLED WITH YOUR NAUSEA MEDICATION  *UNUSUAL SHORTNESS OF BREATH  *UNUSUAL BRUISING OR BLEEDING  TENDERNESS IN MOUTH AND THROAT WITH OR WITHOUT PRESENCE OF ULCERS  *URINARY PROBLEMS  *BOWEL PROBLEMS  UNUSUAL RASH Items with * indicate a potential emergency and should be followed up as soon as possible. If you have an emergency after office hours please contact your primary care physician or go to the nearest emergency department.  Please call the clinic during office hours if you have any questions or concerns.   You may also contact the Patient Navigator at [phone] should you have any questions or need assistance in obtaining follow up care.      Resources For Cancer Patients and their Caregivers ? American Cancer Society: Can assist with transportation, wigs, general needs, runs Look Good Feel Better.        7056487849 ? Cancer Care: Provides financial assistance, online support groups, medication/co-pay assistance.  1-800-813-HOPE  531-028-2941) ? [place] Assists [place] cancer patients and their families through emotional , educational and financial support.  248-672-0885 ? [place] DSS Where to apply for food stamps, Medicaid and utility assistance. [phone] ? [facility]: Transportation to medical appointments. 501-450-5202 ? Social Security Administration: May apply for disability if have a Stage IV cancer. [phone] 580-629-6860 ? [employer], Disability and Transit Services: Assists with nutrition, care and transit needs. [phone]

## 2020-06-14 ENCOUNTER — Other Ambulatory Visit (HOSPITAL_COMMUNITY): Payer: Self-pay | Admitting: *Deleted

## 2020-06-14 DIAGNOSIS — C61 Malignant neoplasm of prostate: Secondary | ICD-10-CM

## 2020-06-14 DIAGNOSIS — G893 Neoplasm related pain (acute) (chronic): Secondary | ICD-10-CM

## 2020-06-14 MED ORDER — FENTANYL 75 MCG/HR TD PT72: 1.0000 | MEDICATED_PATCH | TRANSDERMAL | 0 refills | Status: DC

## 2020-06-19 ENCOUNTER — Ambulatory Visit (HOSPITAL_COMMUNITY): Payer: Medicare Other

## 2020-06-20 ENCOUNTER — Inpatient Hospital Stay (HOSPITAL_COMMUNITY): Payer: Medicare Other | Attending: Hematology

## 2020-06-20 ENCOUNTER — Other Ambulatory Visit: Payer: Self-pay

## 2020-06-20 ENCOUNTER — Inpatient Hospital Stay (HOSPITAL_BASED_OUTPATIENT_CLINIC_OR_DEPARTMENT_OTHER): Payer: Medicare Other | Admitting: Hematology

## 2020-06-20 ENCOUNTER — Other Ambulatory Visit (HOSPITAL_COMMUNITY): Payer: Self-pay | Admitting: *Deleted

## 2020-06-20 ENCOUNTER — Inpatient Hospital Stay (HOSPITAL_COMMUNITY): Payer: Medicare Other

## 2020-06-20 VITALS — BP 98/65 | HR 120 | Temp 96.9°F | Resp 18 | Wt 203.2 lb

## 2020-06-20 DIAGNOSIS — M549 Dorsalgia, unspecified: Secondary | ICD-10-CM | POA: Diagnosis not present

## 2020-06-20 DIAGNOSIS — M25512 Pain in left shoulder: Secondary | ICD-10-CM | POA: Diagnosis not present

## 2020-06-20 DIAGNOSIS — Z79899 Other long term (current) drug therapy: Secondary | ICD-10-CM | POA: Insufficient documentation

## 2020-06-20 DIAGNOSIS — Z86718 Personal history of other venous thrombosis and embolism: Secondary | ICD-10-CM | POA: Diagnosis not present

## 2020-06-20 DIAGNOSIS — M25551 Pain in right hip: Secondary | ICD-10-CM | POA: Diagnosis not present

## 2020-06-20 DIAGNOSIS — M25511 Pain in right shoulder: Secondary | ICD-10-CM | POA: Insufficient documentation

## 2020-06-20 DIAGNOSIS — C7951 Secondary malignant neoplasm of bone: Secondary | ICD-10-CM | POA: Diagnosis not present

## 2020-06-20 DIAGNOSIS — Z8042 Family history of malignant neoplasm of prostate: Secondary | ICD-10-CM | POA: Diagnosis not present

## 2020-06-20 DIAGNOSIS — F17210 Nicotine dependence, cigarettes, uncomplicated: Secondary | ICD-10-CM | POA: Insufficient documentation

## 2020-06-20 DIAGNOSIS — C61 Malignant neoplasm of prostate: Secondary | ICD-10-CM | POA: Diagnosis not present

## 2020-06-20 DIAGNOSIS — G629 Polyneuropathy, unspecified: Secondary | ICD-10-CM | POA: Insufficient documentation

## 2020-06-20 DIAGNOSIS — Z800 Family history of malignant neoplasm of digestive organs: Secondary | ICD-10-CM | POA: Insufficient documentation

## 2020-06-20 LAB — COMPREHENSIVE METABOLIC PANEL
ALT: 7 U/L (ref 0–44)
AST: 11 U/L — ABNORMAL LOW (ref 15–41)
Albumin: 3.2 g/dL — ABNORMAL LOW (ref 3.5–5.0)
Alkaline Phosphatase: 94 U/L (ref 38–126)
Anion gap: 10 (ref 5–15)
BUN: 10 mg/dL (ref 8–23)
CO2: 28 mmol/L (ref 22–32)
Calcium: 8.8 mg/dL — ABNORMAL LOW (ref 8.9–10.3)
Chloride: 100 mmol/L (ref 98–111)
Creatinine, Ser: 1 mg/dL (ref 0.61–1.24)
GFR, Estimated: >60 mL/min (ref >60)
Glucose, Bld: 119 mg/dL — ABNORMAL HIGH (ref 70–99)
Potassium: 3.9 mmol/L (ref 3.5–5.1)
Sodium: 138 mmol/L (ref 135–145)
Total Bilirubin: 0.4 mg/dL (ref 0.3–1.2)
Total Protein: 6.4 g/dL — ABNORMAL LOW (ref 6.5–8.1)

## 2020-06-20 LAB — CBC WITH DIFFERENTIAL/PLATELET
Abs Immature Granulocytes: 0.07 10*3/uL (ref 0.00–0.07)
Basophils Absolute: 0.1 10*3/uL (ref 0.0–0.1)
Basophils Relative: 1 %
Eosinophils Absolute: 0.1 10*3/uL (ref 0.0–0.5)
Eosinophils Relative: 1 %
HCT: 34.7 % — ABNORMAL LOW (ref 39.0–52.0)
Hemoglobin: 11.2 g/dL — ABNORMAL LOW (ref 13.0–17.0)
Immature Granulocytes: 1 %
Lymphocytes Relative: 8 %
Lymphs Abs: 0.8 10*3/uL (ref 0.7–4.0)
MCH: 31.2 pg (ref 26.0–34.0)
MCHC: 32.3 g/dL (ref 30.0–36.0)
MCV: 96.7 fL (ref 80.0–100.0)
Monocytes Absolute: 0.7 10*3/uL (ref 0.1–1.0)
Monocytes Relative: 7 %
Neutro Abs: 7.9 10*3/uL — ABNORMAL HIGH (ref 1.7–7.7)
Neutrophils Relative %: 82 %
Platelets: 231 10*3/uL (ref 150–400)
RBC: 3.59 MIL/uL — ABNORMAL LOW (ref 4.22–5.81)
RDW: 16.3 % — ABNORMAL HIGH (ref 11.5–15.5)
WBC: 9.6 10*3/uL (ref 4.0–10.5)
nRBC: 0 % (ref 0.0–0.2)

## 2020-06-20 LAB — MAGNESIUM: Magnesium: 1.8 mg/dL (ref 1.7–2.4)

## 2020-06-20 LAB — PSA: Prostatic Specific Antigen: 271 ng/mL — ABNORMAL HIGH (ref 0.00–4.00)

## 2020-06-20 MED ORDER — NALOXEGOL OXALATE 25 MG PO TABS: 25.0000 mg | ORAL_TABLET | ORAL | 1 refills | Status: DC | PRN

## 2020-06-20 MED ORDER — NALOXEGOL OXALATE 25 MG PO TABS
25.0000 mg | ORAL_TABLET | Freq: Once | ORAL | Status: AC
  Administered 2020-06-20: 25 mg via ORAL
  Filled 2020-06-20: qty 1

## 2020-06-20 MED ORDER — OXYCODONE HCL 10 MG PO TABS: 10.0000 mg | ORAL_TABLET | Freq: Four times a day (QID) | ORAL | 0 refills | Status: DC | PRN

## 2020-06-20 MED ORDER — HEPARIN SOD (PORK) LOCK FLUSH 100 UNIT/ML IV SOLN
500.0000 [IU] | Freq: Once | INTRAVENOUS | Status: AC
  Administered 2020-06-20: 500 [IU] via INTRAVENOUS

## 2020-06-20 MED ORDER — SODIUM CHLORIDE 0.9% FLUSH
20.0000 mL | INTRAVENOUS | Status: DC | PRN
  Administered 2020-06-20: 20 mL via INTRAVENOUS

## 2020-06-20 NOTE — Progress Notes (Signed)
Patients port flushed without difficulty.  Good blood return noted with no bruising or swelling noted at site.  Transparent dressing applied and patient left accessed for chemotherapy treatment. °

## 2020-06-20 NOTE — Progress Notes (Signed)
[place], [place] [address]   CLINIC:  Medical Oncology/Hematology  PCP:  [name], [place] / [place] [address] [phone]   REASON FOR VISIT:  Follow-up for metastatic prostate cancer to bone  PRIOR THERAPY:  1. Docetaxelx 6 cyclesfrom 01/07/2016 to 04/21/2016. 2. Olaparib from 07/28/2019 to 11/03/2019 with progression.  NGS Results: Not done  CURRENT THERAPY: Docetaxel & Aloxi every 3 weeks  BRIEF ONCOLOGIC HISTORY:  Oncology History  Prostate carcinoma ([place])  12/10/2015 Imaging   CTA chest, multiple thoracic and rib osseous lesions. no primary evident. Atherosclerosis including aortic and CAD   12/12/2015 Imaging   CT abdomen/pelvis with sclerotic osseous lesions througout the lumbar spine, sacrum, bilateral iliac bones and L proximal femur worrisome for sclerotic osseous mets,mild prostatomegaly, no LAD, Infrarenal 3.8 cm AAA   12/12/2015 Tumor Marker   PSA 153.85   12/18/2015 Imaging   Bone Scan Diffuse bony metastatic disease, posterior calvarium, sternum, thoracic, lumbar spine, bilateral ribs, L shoulder, bilateral bony pelvis, proximal L femur   12/24/2015 Initial Biopsy   CT biopsy of L iliac bone lesion   12/26/2015 Pathology Results   Bone, biopsy, left iliac - POSITIVE FOR METASTATIC ADENOCARCINOMA.   12/28/2015 - 09/01/2016 Chemotherapy   Firmagon instituted 240 mg    01/01/2016 Procedure   Port placed by IR.   01/07/2016 - 04/11/2016 Chemotherapy   Docetaxel every 21 days x 6 cycles with dose reductions due to tolerance   02/18/2016 Adverse Reaction   GI toxicity from docetaxel, dose reduced to 60 mg/m2   02/18/2016 Treatment Plan Change   Docetaxel dose reduced by 20%   03/06/2016 Procedure   Dr. Enrique Sack- Multiple extraction of tooth numbers 3, 5, 6, 8, 9, 10, 11, 12, 20, 21, 22, 27, 28, and 31. 4 Quadrants of alveoloplasty. Bilateral mandibular lingual tori reductions.   04/09/2016 Imaging    Brain MRI 1. No intracranial metastatic disease 2. No acute intracranial abnormality 3. Findings of chronic microvascular disease   05/14/2016 Imaging   CT C/A/P Stable appearing diffuse bony metastatic disease. No new significant disease is seen Innumerable patchy sclerotic osseous metastases throughout the axial and proximal appendicular skeleton, stable in size and distribution, significantly increased in density in the interval, likely reflecting treatment effect. 2. No new or progressive metastatic disease in the chest, abdomen or pelvis. 3. Bilateral perifissural pulmonary nodules are stable and probably benign. 4. Aortic atherosclerosis. Infrarenal 4.3 cm abdominal aortic aneurysm, minimally increased in size. Recommend followup by ultrasound in 1 year.   06/02/2016 Tumor Marker   PSA 0.99 ng/ml    - 07/31/2016 Radiation Therapy   Palliative XRT by Dr. Lianne Cure in [place].    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  The Multi-Gene Panel offered by [facility] includes sequencing and/or deletion duplication testing of the following 85 genes: AIP, ALK, APC, ATM, AXIN2,BAP1,  BARD1, BLM, BMPR1A, BRCA1, BRCA2, BRIP1, CASR, CDC73, CDH1, CDK4, CDKN1B, CDKN1C, CDKN2A (p14ARF), CDKN2A (p16INK4a), CEBPA, CHEK2, CTNNA1, DICER1, DIS3L2, EGFR (c.2369C>T, p.Thr790Met variant only), EPCAM (Deletion/duplication testing only), FH, FLCN, GATA2, GPC3, GREM1 (Promoter region deletion/duplication testing only), HOXB13 (c.251G>A, p.Gly84Glu), HRAS, KIT, MAX, MEN1, MET, MITF (c.952G>A, p.Glu318Lys variant only), MLH1, MSH2, MSH3, MSH6, MUTYH, NBN, NF1, NF2, NTHL1, PALB2, PDGFRA, PHOX2B, PMS2, POLD1, POLE, POT1, PRKAR1A, PTCH1, PTEN, RAD50, RAD51C, RAD51D, RB1, RECQL4, RET,  RNF43, RUNX1, SDHAF2, SDHA (sequence changes only), SDHB, SDHC, SDHD, SMAD4, SMARCA4, SMARCB1, SMARCE1,  STK11, SUFU, TERC, TERT, TMEM127, TP53, TSC1, TSC2, VHL, WRN and WT1.  The report date is August 24, 2018  This is not thought to increase the risk for colon cancer or other cancers, unless there is the presence of two pathogenic mutations.   09/27/2018 - 06/07/2019 Chemotherapy   The patient had palonosetron (ALOXI) injection 0.25 mg, 0.25 mg, Intravenous,  Once, 10 of 10 cycles Administration: 0.25 mg (09/27/2018), 0.25 mg (10/25/2018), 0.25 mg (11/23/2018), 0.25 mg (12/21/2018), 0.25 mg (01/18/2019), 0.25 mg (02/15/2019), 0.25 mg (03/15/2019), 0.25 mg (04/12/2019), 0.25 mg (05/10/2019), 0.25 mg (06/07/2019) pegfilgrastim (NEULASTA ONPRO KIT) injection 6 mg, 6 mg, Subcutaneous, Once, 9 of 9 cycles Administration: 6 mg (10/25/2018), 6 mg (11/23/2018), 6 mg (12/21/2018), 6 mg (01/18/2019), 6 mg (02/15/2019), 6 mg (03/15/2019), 6 mg (04/12/2019), 6 mg (05/10/2019), 6 mg (06/07/2019) pegfilgrastim-cbqv (UDENYCA) injection 6 mg, 6 mg, Subcutaneous, Once, 1 of 1 cycle Administration: 6 mg (09/29/2018) cabazitaxel (JEVTANA) 35 mg in dextrose 5 % 250 mL chemo infusion, 15 mg/m2 = 35 mg (100 % of original dose 15 mg/m2), Intravenous,  Once, 10 of 10 cycles Dose modification: 15 mg/m2 (original dose 15 mg/m2, Cycle 1, Reason: Provider Judgment), 20 mg/m2 (original dose 15 mg/m2, Cycle 2, Reason: Provider Judgment), 20 mg/m2 (original dose 20 mg/m2, Cycle 5, Reason: Patient Age, Comment: Change in jevtana orderable), 15 mg/m2 (75 % of original dose 20 mg/m2, Cycle 8, Reason: Other (see comments), Comment: nausea, worsening numbness) Administration: 35 mg (09/27/2018), 46 mg (10/25/2018), 46 mg (11/23/2018), 46 mg (12/21/2018), 46 mg (01/18/2019), 46 mg (02/15/2019), 46 mg (03/15/2019), 35 mg (04/12/2019), 35 mg (05/10/2019), 35 mg (06/07/2019)  for chemotherapy treatment.    11/21/2019 -  Chemotherapy   The patient had palonosetron (ALOXI) injection 0.25 mg, 0.25 mg, Intravenous,  Once, 10 of 12 cycles Administration: 0.25 mg (11/21/2019),  0.25 mg (12/12/2019), 0.25 mg (01/03/2020), 0.25 mg (01/24/2020), 0.25 mg (02/14/2020), 0.25 mg (03/06/2020), 0.25 mg (03/27/2020), 0.25 mg (04/17/2020), 0.25 mg (05/09/2020), 0.25 mg (05/30/2020) pegfilgrastim (NEULASTA ONPRO KIT) injection 6 mg, 6 mg, Subcutaneous, Once, 10 of 12 cycles Administration: 6 mg (11/21/2019), 6 mg (12/12/2019), 6 mg (01/03/2020), 6 mg (01/24/2020), 6 mg (02/14/2020), 6 mg (03/06/2020), 6 mg (03/27/2020), 6 mg (04/17/2020), 6 mg (05/09/2020), 6 mg (05/30/2020) DOCEtaxel (TAXOTERE) 140 mg in sodium chloride 0.9 % 250 mL chemo infusion, 60 mg/m2 = 140 mg (80 % of original dose 75 mg/m2), Intravenous,  Once, 10 of 12 cycles Dose modification: 60 mg/m2 (80 % of original dose 75 mg/m2, Cycle 1, Reason: Other (see comments), Comment: based on previous tolerabiliity in 2017) Administration: 140 mg (11/21/2019), 140 mg (12/12/2019), 140 mg (01/03/2020), 140 mg (01/24/2020), 140 mg (02/14/2020), 140 mg (03/06/2020), 140 mg (03/27/2020), 140 mg (04/17/2020), 140 mg (05/09/2020), 140 mg (05/30/2020)  for chemotherapy treatment.    Bone metastases ([place])  12/26/2015 Initial Diagnosis   Bone metastases ([place])   09/27/2018 - 06/07/2019 Chemotherapy   The patient had palonosetron (ALOXI) injection 0.25 mg, 0.25 mg, Intravenous,  Once, 10 of 10 cycles Administration: 0.25 mg (09/27/2018), 0.25 mg (10/25/2018), 0.25 mg (11/23/2018), 0.25 mg (12/21/2018), 0.25 mg (01/18/2019), 0.25 mg (02/15/2019), 0.25 mg (03/15/2019), 0.25 mg (04/12/2019), 0.25 mg (05/10/2019), 0.25 mg (06/07/2019) pegfilgrastim (NEULASTA ONPRO KIT) injection 6 mg, 6 mg, Subcutaneous, Once, 9 of 9 cycles Administration: 6 mg (10/25/2018), 6 mg (11/23/2018), 6 mg (12/21/2018), 6 mg (01/18/2019), 6 mg (02/15/2019), 6 mg (03/15/2019),  6 mg (04/12/2019), 6 mg (05/10/2019), 6 mg (06/07/2019) pegfilgrastim-cbqv (UDENYCA) injection 6 mg, 6 mg, Subcutaneous, Once, 1 of 1 cycle Administration: 6 mg (09/29/2018) cabazitaxel (JEVTANA) 35 mg in dextrose 5 % 250 mL chemo infusion, 15  mg/m2 = 35 mg (100 % of original dose 15 mg/m2), Intravenous,  Once, 10 of 10 cycles Dose modification: 15 mg/m2 (original dose 15 mg/m2, Cycle 1, Reason: Provider Judgment), 20 mg/m2 (original dose 15 mg/m2, Cycle 2, Reason: Provider Judgment), 20 mg/m2 (original dose 20 mg/m2, Cycle 5, Reason: Patient Age, Comment: Change in jevtana orderable), 15 mg/m2 (75 % of original dose 20 mg/m2, Cycle 8, Reason: Other (see comments), Comment: nausea, worsening numbness) Administration: 35 mg (09/27/2018), 46 mg (10/25/2018), 46 mg (11/23/2018), 46 mg (12/21/2018), 46 mg (01/18/2019), 46 mg (02/15/2019), 46 mg (03/15/2019), 35 mg (04/12/2019), 35 mg (05/10/2019), 35 mg (06/07/2019)  for chemotherapy treatment.    11/21/2019 -  Chemotherapy   The patient had palonosetron (ALOXI) injection 0.25 mg, 0.25 mg, Intravenous,  Once, 10 of 12 cycles Administration: 0.25 mg (11/21/2019), 0.25 mg (12/12/2019), 0.25 mg (01/03/2020), 0.25 mg (01/24/2020), 0.25 mg (02/14/2020), 0.25 mg (03/06/2020), 0.25 mg (03/27/2020), 0.25 mg (04/17/2020), 0.25 mg (05/09/2020), 0.25 mg (05/30/2020) pegfilgrastim (NEULASTA ONPRO KIT) injection 6 mg, 6 mg, Subcutaneous, Once, 10 of 12 cycles Administration: 6 mg (11/21/2019), 6 mg (12/12/2019), 6 mg (01/03/2020), 6 mg (01/24/2020), 6 mg (02/14/2020), 6 mg (03/06/2020), 6 mg (03/27/2020), 6 mg (04/17/2020), 6 mg (05/09/2020), 6 mg (05/30/2020) DOCEtaxel (TAXOTERE) 140 mg in sodium chloride 0.9 % 250 mL chemo infusion, 60 mg/m2 = 140 mg (80 % of original dose 75 mg/m2), Intravenous,  Once, 10 of 12 cycles Dose modification: 60 mg/m2 (80 % of original dose 75 mg/m2, Cycle 1, Reason: Other (see comments), Comment: based on previous tolerabiliity in 2017) Administration: 140 mg (11/21/2019), 140 mg (12/12/2019), 140 mg (01/03/2020), 140 mg (01/24/2020), 140 mg (02/14/2020), 140 mg (03/06/2020), 140 mg (03/27/2020), 140 mg (04/17/2020), 140 mg (05/09/2020), 140 mg (05/30/2020)  for chemotherapy treatment.      CANCER STAGING: Cancer  Staging Prostate carcinoma [facility]) Staging form: Prostate, AJCC 7th Edition - Clinical stage from 12/24/2015: Stage IV (TX, N0, M1b, PSA: 20 or greater) - Signed by Baird Cancer, PA-C on 01/07/2016   INTERVAL HISTORY:  [name], a 64 y.o. male, returns for routine follow-up and consideration for next cycle of chemotherapy. Levon was last seen on 05/30/2020.  Due for cycle #11 of docetaxel and Aloxi today.   Overall, he tells me he has been feeling fair. He was not able to do his scan yesterday because of pain and weakness in his right lateral hip radiating down his leg along with deep right inguinal pain when moving his leg. The pain and weakness worsened since 11/14. He continues having pain along his spine and left posterior shoulder. He takes Norco every 4-6 hours and applies a fentanyl patch every 3 days; the pain meds help with his back pain, but not with his hip pain. He tried taking Dilaudid but it caused him stomach ache. He is still constipated; his last BM was on 11/14. He continues losing weight since he eats only 1 meal a day. He had N/V/D last week which lasted for a couple days, but then resolved.  Due to his worsening PSA and overall condition, he will have another scan before receiving his next treatment.   REVIEW OF SYSTEMS:  Review of Systems  Constitutional: Positive for appetite change (25%) and  fatigue (25%).  Respiratory: Positive for shortness of breath (w/ bending).   Gastrointestinal: Positive for diarrhea (1.5 weeks ago lasting 3 days), nausea and vomiting (1.5 weeks ago lasting 3 days).  Musculoskeletal: Positive for arthralgias (bilat shoulders pain) and back pain (6/10 back and R lateral hip pain).  Neurological: Positive for dizziness (occasional), extremity weakness (R leg), headaches (occasional) and numbness (toes).  All other systems reviewed and are negative.   PAST MEDICAL/SURGICAL HISTORY:  Past Medical History:  Diagnosis Date  . Arthritis     Left knee  . Bone cancer ([place])   . Bone metastases ([place]) 12/26/2015  . Cancer [facility])    Prostate  . DVT (deep venous thrombosis) ([place]) 2004  . Family history of prostate cancer   . Family history of stomach cancer   . Kidney stones    ~2002  . Prostate carcinoma ([address]) 12/26/2015   Past Surgical History:  Procedure Laterality Date  . CIRCUMCISION  30 years ago  . MULTIPLE EXTRACTIONS WITH ALVEOLOPLASTY N/A 03/06/2016   Procedure: Extraction of tooth #'s 3,5,6,8-12,20-22, 27, 28, and 31 with alveoloplasty and bilateral mandibular tori reductions;  Surgeon: Lenn Cal, DDS;  Location: [facility];  Service: Oral Surgery;  Laterality: N/A;    SOCIAL HISTORY:  Social History   Socioeconomic History  . Marital status: Single    Spouse name: Not on file  . Number of children: 2  . Years of education: Not on file  . Highest education level: Not on file  Occupational History  . Occupation: Construction/carpenter  Tobacco Use  . Smoking status: Current Every Day Smoker    Packs/day: 1.00    Years: 50.00    Pack years: 50.00    Types: Cigarettes    Start date: 06/16/1965  . Smokeless tobacco: Former Systems developer    Types: [place] date: 08/04/2005  Substance and Sexual Activity  . Alcohol use: No    Alcohol/week: 0.0 standard drinks  . Drug use: Yes    Types: Marijuana    Comment: THC for appetite; occ  . Sexual activity: Not on file  Other Topics Concern  . Not on file  Social History Narrative  . Not on file   Social Determinants of Health   Financial Resource Strain:   . Difficulty of Paying Living Expenses: Not on file  Food Insecurity:   . Worried About Charity fundraiser in the Last Year: Not on file  . Ran Out of Food in the Last Year: Not on file  Transportation Needs:   . Lack of Transportation (Medical): Not on file  . Lack of Transportation (Non-Medical): Not on file  Physical Activity:   . Days of Exercise per Week: Not on file  . Minutes of Exercise per  Session: Not on file  Stress:   . Feeling of Stress : Not on file  Social Connections:   . Frequency of Communication with Friends and Family: Not on file  . Frequency of Social Gatherings with Friends and Family: Not on file  . Attends Religious Services: Not on file  . Active Member of Clubs or Organizations: Not on file  . Attends Archivist Meetings: Not on file  . Marital Status: Not on file  Intimate Partner Violence:   . Fear of Current or Ex-Partner: Not on file  . Emotionally Abused: Not on file  . Physically Abused: Not on file  . Sexually Abused: Not on file    FAMILY HISTORY:  Family History  Problem Relation Age of Onset  . Stomach cancer Mother        dx 26s  . Aneurysm Maternal Aunt        brain  . Alcohol abuse Maternal Uncle   . Stomach cancer Maternal Grandmother        dx 50s-60s  . Prostate cancer Maternal Uncle        dx 41s    CURRENT MEDICATIONS:  Current Outpatient Medications  Medication Sig Dispense Refill  . DOCEtaxel (TAXOTERE IV) Inject 75 mg/m2 into the vein every 21 ( twenty-one) days.     Marland Kitchen docusate sodium (COLACE) 100 MG capsule Take 2 capsules daily to help soften the stool.  It this does not work, you can increase to 2 capsules in the morning and 2 capsules in the evening.  If you do not have a bowel movement please call the [facility]. 30 capsule 1  . fentaNYL (DURAGESIC) 75 MCG/HR Place 1 patch onto the skin every 3 (three) days. 10 patch 0  . gabapentin (NEURONTIN) 300 MG capsule Take 1 capsule (300 mg total) by mouth at bedtime. 30 capsule 3  . HYDROcodone-acetaminophen (NORCO) 10-325 MG tablet Take 1 tablet by mouth every 4 (four) hours as needed. 180 tablet 0  . HYDROmorphone (DILAUDID) 2 MG tablet Take 1 tablet (2 mg total) by mouth every 4 (four) hours as needed for severe pain. 84 tablet 0  . NARCAN 4 MG/0.1ML LIQD nasal spray kit Place 1 spray into the nose as needed.     Marland Kitchen omeprazole (PRILOSEC) 20 MG  capsule Take 1 capsule (20 mg total) by mouth 2 (two) times daily. 60 capsule 2  . ondansetron (ZOFRAN) 8 MG tablet Take every 8 hours as needed 30 tablet 6  . predniSONE (DELTASONE) 5 MG tablet Take 1 tablet (5 mg total) by mouth daily with breakfast. 30 tablet 3  . silodosin (RAPAFLO) 8 MG CAPS capsule Take 1 capsule (8 mg total) by mouth daily with breakfast. 30 capsule 11  . tamsulosin (FLOMAX) 0.4 MG CAPS capsule Take 2 capsules (0.8 mg total) by mouth at bedtime. 60 capsule 2  . traZODone (DESYREL) 100 MG tablet Take 1 tablet (100 mg total) by mouth at bedtime as needed for sleep. 30 tablet 3  . prochlorperazine (COMPAZINE) 10 MG tablet Take 1 tablet (10 mg total) by mouth every 6 (six) hours as needed for nausea or vomiting. (Patient not taking: Reported on 06/20/2020) 60 tablet 6   No current facility-administered medications for this visit.   Facility-Administered Medications Ordered in Other Visits  Medication Dose Route Frequency Provider Last Rate Last Admin  . heparin lock flush 100 unit/mL  500 Units Intravenous Once Derek Jack, MD      . naloxegol oxalate (MOVANTIK) tablet 25 mg  25 mg Oral Once Derek Jack, MD      . sodium chloride flush (NS) 0.9 % injection 10 mL  10 mL Intracatheter PRN Derek Jack, MD   10 mL at 01/18/19 0815  . sodium chloride flush (NS) 0.9 % injection 20 mL  20 mL Intravenous PRN Derek Jack, MD        ALLERGIES:  Allergies  Allergen Reactions  . Xgeva [Denosumab]     Dental issues with previous doses  . Meloxicam Other (See Comments)    Caused stomach bleeding.    PHYSICAL EXAM:  Performance status (ECOG): 1 - Symptomatic but completely ambulatory  Vitals:   06/20/20 0859  BP: 98/65  Pulse: (!) 120  Resp: 18  Temp: (!) 96.9 F (36.1 C)  SpO2: 98%   Wt Readings from Last 3 Encounters:  06/20/20 203 lb 3.2 oz (92.2 kg)  05/30/20 208 lb 1.6 oz (94.4 kg)  05/09/20 209 lb 11.2 oz (95.1 kg)   Physical  Exam Vitals reviewed.  Constitutional:      Appearance: Normal appearance.  Musculoskeletal:     Right hip: Bony tenderness (lateral hip TTP) present.  Neurological:     General: No focal deficit present.     Mental Status: He is alert and oriented to person, place, and time.  Psychiatric:        Mood and Affect: Mood normal.        Behavior: Behavior normal.     LABORATORY DATA:  I have reviewed the labs as listed.  CBC Latest Ref Rng & Units 06/20/2020 05/30/2020 05/09/2020  WBC 4.0 - 10.5 K/uL 9.6 10.1 10.3  Hemoglobin 13.0 - 17.0 g/dL 11.2(L) 11.9(L) 11.7(L)  Hematocrit 39 - 52 % 34.7(L) 36.6(L) 36.3(L)  Platelets 150 - 400 K/uL 231 228 226   CMP Latest Ref Rng & Units 06/20/2020 05/30/2020 05/09/2020  Glucose 70 - 99 mg/dL 119(H) 106(H) 132(H)  BUN 8 - 23 mg/dL _0 Creatinine 0.61 - 1.24 mg/dL 1.00 0.86 0.88  Sodium 135 - 145 mmol/L 138 134(L) 136  Potassium 3.5 - 5.1 mmol/L 3.9 3.7 3.3(L)  Chloride 98 - 111 mmol/L 100 101 100  CO2 22 - 32 mmol/L _1 Calcium 8.9 - 10.3 mg/dL 8.8(L) 8.5(L) 8.7(L)  Total Protein 6.5 - 8.1 g/dL 6.4(L) 6.2(L) 6.1(L)  Total Bilirubin 0.3 - 1.2 mg/dL 0.4 0.4 0.6  Alkaline Phos 38 - 126 U/L 94 97 85  AST 15 - 41 U/L 11(L) 11(L) 10(L)  ALT 0 - 44 U/L _2 Lab Results  Component Value Date   PSA 224.00 (H) 05/30/2020   PSA 171.00 (H) 05/09/2020   PSA 136.03 (H) 04/17/2020    DIAGNOSTIC IMAGING:  I have independently reviewed the scans and discussed with the patient. No results found.   ASSESSMENT:  1. 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 -Abiraterone/prednisone, cabazitaxel with progression. -Guardant 360 showed ATM mutation. -Olaparib from 07/28/2019 through 11/03/2019 with progression. -Bone scan on 11/10/2019 showed worsening disease. PSA was 132 on 11/21/2019. -CT scan on 08/11/2019 showed bone metastasis with no visceral  metastasis. -Docetaxel 60 mg per metered square started on 11/21/2019. -Bone scan on 01/23/2020 showed stable osseous metastatic disease. -Bone scan on 05/07/2020 shows multifocal abnormal uptake consistent with widespread bony metastatic disease, stable.  No new foci.   PLAN:  1. 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  This pain worsened in the last 3-4 days. -Reviewed his PSA which has increased to 224. -I have recommended holding his treatment as I doubt it is helping at this point. -He had postponed CTAP secondary to pain.  I will reschedule it with particular emphasis on the hip joint.  He has declined MRI. -Other options include mitoxantrone and radium-223. -We also discussed comfort care in the form of hospice. -We will talk about it after CT scans. -I have also recommended echocardiogram if we choose to proceed with mitoxantrone.  2. Bone Metastatic Disease: -Denosumab on hold indefinitely secondary to  ONJ.  3. Back Pain: -Continue fentanyl 75 mcg patch.  He could not tolerate any higher dose. -He is taking hydrocodone 10 mg every 4 hours which is not helping. -I will discontinue hydrocodone and start him on oxycodone 10 mg every 6 hours as needed.  4.  Opioid constipation: -He is taking stool softeners and MiraLAX and has bowel movement once every 3 days. -I will start him on Movantik 25 mg every other day as needed.  5. Peripheral neuropathy: -Continue gabapentin at bedtime.   Orders placed this encounter:  Orders Placed This Encounter  Procedures  . NM Bone Scan Whole Body     Derek Jack, MD [place] [phone]   I, Milinda Antis, am acting as a scribe for Dr. Sanda Linger.  I, Derek Jack MD, have reviewed the above documentation for accuracy and completeness, and I agree with the above.

## 2020-06-20 NOTE — Progress Notes (Signed)
Patient was unable to get scans yesterday.  We will hold treatment, reschedule patient's scans and then follow up with him afterwards. Primary RN and pharmacy aware.

## 2020-06-20 NOTE — Patient Instructions (Signed)
[place] at [facility] Discharge Instructions  You were seen today by Dr. Delton Coombes. He went over your recent results. You did not receive your treatment today. You will be scheduled for a CT scan of your abdomen and pelvis and an echocardiogram. You will be prescribed Percocet to help with your pain and Movantik for your constipation. Dr. Delton Coombes will see you back after the scans for labs and follow up.   Thank you for choosing [place] at [facility] to provide your oncology and hematology care.  To afford each patient quality time with our provider, please arrive at least 15 minutes before your scheduled appointment time.   If you have a lab appointment with the [facility] please come in thru the Main Entrance and check in at the main information desk  You need to re-schedule your appointment should you arrive 10 or more minutes late.  We strive to give you quality time with our providers, and arriving late affects you and other patients whose appointments are after yours.  Also, if you no show three or more times for appointments you may be dismissed from the clinic at the providers discretion.     Again, thank you for choosing [facility].  Our hope is that these requests will decrease the amount of time that you wait before being seen by our physicians.       _____________________________________________________________  Should you have questions after your visit to [facility], please contact our office at [phone] between the hours of 8:00 a.m. and 4:30 p.m.  Voicemails left after 4:00 p.m. will not be returned until the following business day.  For prescription refill requests, have your pharmacy contact our office and allow 72 hours.    Cancer Center Support Programs:   > Cancer Support Group  2nd Tuesday of the month 1pm-2pm, Journey Room

## 2020-06-20 NOTE — Progress Notes (Signed)
1023 Labs reviewed with and pt seen by Dr. Delton Coombes and pt's chemo tx to be held today per MD                                           1044 Pt discharged self ambulatory using his cane in satisfactory condition

## 2020-06-22 ENCOUNTER — Other Ambulatory Visit: Payer: Self-pay

## 2020-06-22 ENCOUNTER — Ambulatory Visit (HOSPITAL_COMMUNITY)
Admission: RE | Admit: 2020-06-22 | Discharge: 2020-06-22 | Disposition: A | Discharge status: Home / Self Care | Payer: Medicare Other | Source: Ambulatory Visit | Attending: Hematology | Admitting: Hematology

## 2020-06-22 DIAGNOSIS — C7951 Secondary malignant neoplasm of bone: Secondary | ICD-10-CM | POA: Diagnosis not present

## 2020-06-22 DIAGNOSIS — Z0189 Encounter for other specified special examinations: Secondary | ICD-10-CM

## 2020-06-22 DIAGNOSIS — Z01818 Encounter for other preprocedural examination: Secondary | ICD-10-CM | POA: Insufficient documentation

## 2020-06-22 DIAGNOSIS — I517 Cardiomegaly: Secondary | ICD-10-CM | POA: Insufficient documentation

## 2020-06-22 DIAGNOSIS — C61 Malignant neoplasm of prostate: Secondary | ICD-10-CM

## 2020-06-22 LAB — ECHOCARDIOGRAM COMPLETE: S' Lateral: 3 cm

## 2020-06-22 NOTE — Progress Notes (Signed)
*  PRELIMINARY RESULTS* Echocardiogram 2D Echocardiogram has been attempted.  Matthew [name] 06/22/2020, 12:21 PM

## 2020-06-26 ENCOUNTER — Telehealth (HOSPITAL_COMMUNITY): Payer: Self-pay | Admitting: Surgery

## 2020-06-26 NOTE — Telephone Encounter (Signed)
Our office received a denial from [employer] regarding the pt's new prescription for Movantik.  The prescription was written for the pt's opioid induced constipation.    I called [employer] to appeal the denial, and an expedited appeal was started over the phone.  Our office should receive an answer in 72 hours.

## 2020-06-27 ENCOUNTER — Other Ambulatory Visit (HOSPITAL_COMMUNITY): Payer: Self-pay | Admitting: *Deleted

## 2020-06-27 MED ORDER — RELISTOR 150 MG PO TABS: 1.0000 | ORAL_TABLET | Freq: Every day | ORAL | 0 refills | Status: AC

## 2020-06-27 NOTE — Telephone Encounter (Signed)
I spoke with Alesha with [facility] and she advises that Movantik is not covered unless patient has tried and failed other medications for constipation.  She states that the recommended medication is Relistor.  I spoke with Dr. Delton Coombes and he is okay with patient trying relistor.  Orders received for patient to take this medication once daily for constipation.  New prescription has been sent to his pharmacy and a prior authorization has been started.    I called patient and left message on his voicemail with instructions.  I advised him to call us back should he have any questions or concerns.

## 2020-07-13 ENCOUNTER — Other Ambulatory Visit (HOSPITAL_COMMUNITY): Payer: Self-pay

## 2020-07-13 MED ORDER — OXYCODONE HCL 10 MG PO TABS: 10.0000 mg | ORAL_TABLET | Freq: Four times a day (QID) | ORAL | 0 refills | Status: DC | PRN

## 2020-07-17 ENCOUNTER — Ambulatory Visit (HOSPITAL_COMMUNITY)
Admission: RE | Admit: 2020-07-17 | Discharge: 2020-07-17 | Disposition: A | Discharge status: Home / Self Care | Payer: Medicare Other | Source: Ambulatory Visit | Attending: Hematology | Admitting: Hematology

## 2020-07-17 ENCOUNTER — Other Ambulatory Visit: Payer: Self-pay

## 2020-07-17 DIAGNOSIS — C7951 Secondary malignant neoplasm of bone: Secondary | ICD-10-CM | POA: Insufficient documentation

## 2020-07-17 DIAGNOSIS — N200 Calculus of kidney: Secondary | ICD-10-CM | POA: Diagnosis not present

## 2020-07-17 DIAGNOSIS — C61 Malignant neoplasm of prostate: Secondary | ICD-10-CM | POA: Diagnosis not present

## 2020-07-17 MED ORDER — TECHNETIUM TC 99M MEDRONATE IV KIT
20.0000 | PACK | Freq: Once | INTRAVENOUS | Status: AC | PRN
  Administered 2020-07-17: 18.1 via INTRAVENOUS

## 2020-07-17 MED ORDER — IOHEXOL 300 MG/ML  SOLN: 75.0000 mL | Freq: Once | INTRAMUSCULAR | Status: DC | PRN

## 2020-07-17 MED ORDER — IOHEXOL 300 MG/ML  SOLN
100.0000 mL | Freq: Once | INTRAMUSCULAR | Status: AC | PRN
  Administered 2020-07-17: 100 mL via INTRAVENOUS

## 2020-07-23 ENCOUNTER — Ambulatory Visit (HOSPITAL_COMMUNITY): Payer: Medicare Other | Admitting: Hematology

## 2020-07-26 ENCOUNTER — Other Ambulatory Visit (HOSPITAL_COMMUNITY): Payer: Self-pay

## 2020-07-26 ENCOUNTER — Inpatient Hospital Stay (HOSPITAL_COMMUNITY): Payer: Medicare Other | Attending: Hematology | Admitting: Hematology and Oncology

## 2020-07-26 ENCOUNTER — Other Ambulatory Visit: Payer: Self-pay

## 2020-07-26 ENCOUNTER — Encounter (HOSPITAL_COMMUNITY): Payer: Self-pay | Admitting: Hematology and Oncology

## 2020-07-26 DIAGNOSIS — M25511 Pain in right shoulder: Secondary | ICD-10-CM | POA: Insufficient documentation

## 2020-07-26 DIAGNOSIS — Z86718 Personal history of other venous thrombosis and embolism: Secondary | ICD-10-CM | POA: Diagnosis not present

## 2020-07-26 DIAGNOSIS — Z9221 Personal history of antineoplastic chemotherapy: Secondary | ICD-10-CM | POA: Diagnosis not present

## 2020-07-26 DIAGNOSIS — Z923 Personal history of irradiation: Secondary | ICD-10-CM | POA: Insufficient documentation

## 2020-07-26 DIAGNOSIS — Z79899 Other long term (current) drug therapy: Secondary | ICD-10-CM | POA: Diagnosis not present

## 2020-07-26 DIAGNOSIS — Z800 Family history of malignant neoplasm of digestive organs: Secondary | ICD-10-CM | POA: Insufficient documentation

## 2020-07-26 DIAGNOSIS — K5909 Other constipation: Secondary | ICD-10-CM | POA: Insufficient documentation

## 2020-07-26 DIAGNOSIS — F17210 Nicotine dependence, cigarettes, uncomplicated: Secondary | ICD-10-CM | POA: Insufficient documentation

## 2020-07-26 DIAGNOSIS — M25551 Pain in right hip: Secondary | ICD-10-CM | POA: Insufficient documentation

## 2020-07-26 DIAGNOSIS — C61 Malignant neoplasm of prostate: Secondary | ICD-10-CM

## 2020-07-26 DIAGNOSIS — M25552 Pain in left hip: Secondary | ICD-10-CM | POA: Insufficient documentation

## 2020-07-26 DIAGNOSIS — M549 Dorsalgia, unspecified: Secondary | ICD-10-CM | POA: Insufficient documentation

## 2020-07-26 DIAGNOSIS — G893 Neoplasm related pain (acute) (chronic): Secondary | ICD-10-CM

## 2020-07-26 DIAGNOSIS — C7951 Secondary malignant neoplasm of bone: Secondary | ICD-10-CM | POA: Insufficient documentation

## 2020-07-26 DIAGNOSIS — G629 Polyneuropathy, unspecified: Secondary | ICD-10-CM | POA: Insufficient documentation

## 2020-07-26 MED ORDER — FENTANYL 75 MCG/HR TD PT72: 1.0000 | MEDICATED_PATCH | TRANSDERMAL | 0 refills | Status: DC

## 2020-07-26 NOTE — Progress Notes (Signed)
[address] [place] [place], [place] [address]   CLINIC:  Medical Oncology/Hematology  PCP:  [name], [place] / [place] [address] [phone]   REASON FOR VISIT:  Follow-up for metastatic prostate cancer to bone  PRIOR THERAPY:   1. Docetaxelx 6 cyclesfrom 01/07/2016 to 04/21/2016. 2. Olaparib from 07/28/2019 to 11/03/2019 with progression.  NGS Results: Not done  CURRENT THERAPY: Docetaxel & Aloxi every 3 weeks  BRIEF ONCOLOGIC HISTORY:  Oncology History  Prostate carcinoma ([facility])  12/10/2015 Imaging   CTA chest, multiple thoracic and rib osseous lesions. no primary evident. Atherosclerosis including aortic and CAD   12/12/2015 Imaging   CT abdomen/pelvis with sclerotic osseous lesions througout the lumbar spine, sacrum, bilateral iliac bones and L proximal femur worrisome for sclerotic osseous mets,mild prostatomegaly, no LAD, Infrarenal 3.8 cm AAA   12/12/2015 Tumor Marker   PSA 153.85   12/18/2015 Imaging   Bone Scan Diffuse bony metastatic disease, posterior calvarium, sternum, thoracic, lumbar spine, bilateral ribs, L shoulder, bilateral bony pelvis, proximal L femur   12/24/2015 Initial Biopsy   CT biopsy of L iliac bone lesion   12/26/2015 Pathology Results   Bone, biopsy, left iliac - POSITIVE FOR METASTATIC ADENOCARCINOMA.   12/28/2015 - 09/01/2016 Chemotherapy   Firmagon instituted 240 mg    01/01/2016 Procedure   Port placed by IR.   01/07/2016 - 04/11/2016 Chemotherapy   Docetaxel every 21 days x 6 cycles with dose reductions due to tolerance   02/18/2016 Adverse Reaction   GI toxicity from docetaxel, dose reduced to 60 mg/m2   02/18/2016 Treatment Plan Change   Docetaxel dose reduced by 20%   03/06/2016 Procedure   Dr. Enrique Sack- Multiple extraction of tooth numbers 3, 5, 6, 8, 9, 10, 11, 12, 20, 21, 22, 27, 28, and 31. 4 Quadrants of alveoloplasty. Bilateral mandibular lingual tori reductions.   04/09/2016  Imaging   Brain MRI 1. No intracranial metastatic disease 2. No acute intracranial abnormality 3. Findings of chronic microvascular disease   05/14/2016 Imaging   CT C/A/P Stable appearing diffuse bony metastatic disease. No new significant disease is seen Innumerable patchy sclerotic osseous metastases throughout the axial and proximal appendicular skeleton, stable in size and distribution, significantly increased in density in the interval, likely reflecting treatment effect. 2. No new or progressive metastatic disease in the chest, abdomen or pelvis. 3. Bilateral perifissural pulmonary nodules are stable and probably benign. 4. Aortic atherosclerosis. Infrarenal 4.3 cm abdominal aortic aneurysm, minimally increased in size. Recommend followup by ultrasound in 1 year.   06/02/2016 Tumor Marker   PSA 0.99 ng/ml    - 07/31/2016 Radiation Therapy   Palliative XRT by Dr. Lianne Cure in [place].    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  The Multi-Gene Panel offered by [facility] includes sequencing and/or deletion duplication testing of the following 85 genes: AIP, ALK, APC, ATM, AXIN2,BAP1,  BARD1, BLM, BMPR1A, BRCA1, BRCA2, BRIP1, CASR, CDC73, CDH1, CDK4, CDKN1B, CDKN1C, CDKN2A (p14ARF), CDKN2A (p16INK4a), CEBPA, CHEK2, CTNNA1, DICER1, DIS3L2, EGFR (c.2369C>T, p.Thr790Met variant only), EPCAM (Deletion/duplication testing only), FH, FLCN, GATA2, GPC3, GREM1 (Promoter region deletion/duplication testing only), HOXB13 (c.251G>A, p.Gly84Glu), HRAS, KIT, MAX, MEN1, MET, MITF (c.952G>A, p.Glu318Lys variant only), MLH1, MSH2, MSH3, MSH6, MUTYH, NBN, NF1, NF2, NTHL1, PALB2, PDGFRA, PHOX2B, PMS2, POLD1, POLE, POT1, PRKAR1A, PTCH1, PTEN, RAD50, RAD51C, RAD51D, RB1, RECQL4,  RET, RNF43, RUNX1, SDHAF2, SDHA (sequence changes only), SDHB, SDHC, SDHD, SMAD4, SMARCA4, SMARCB1,  SMARCE1, STK11, SUFU, TERC, TERT, TMEM127, TP53, TSC1, TSC2, VHL, WRN and WT1.  The report date is August 24, 2018  This is not thought to increase the risk for colon cancer or other cancers, unless there is the presence of two pathogenic mutations.   09/27/2018 - 06/07/2019 Chemotherapy   The patient had palonosetron (ALOXI) injection 0.25 mg, 0.25 mg, Intravenous,  Once, 10 of 10 cycles Administration: 0.25 mg (09/27/2018), 0.25 mg (10/25/2018), 0.25 mg (11/23/2018), 0.25 mg (12/21/2018), 0.25 mg (01/18/2019), 0.25 mg (02/15/2019), 0.25 mg (03/15/2019), 0.25 mg (04/12/2019), 0.25 mg (05/10/2019), 0.25 mg (06/07/2019) pegfilgrastim (NEULASTA ONPRO KIT) injection 6 mg, 6 mg, Subcutaneous, Once, 9 of 9 cycles Administration: 6 mg (10/25/2018), 6 mg (11/23/2018), 6 mg (12/21/2018), 6 mg (01/18/2019), 6 mg (02/15/2019), 6 mg (03/15/2019), 6 mg (04/12/2019), 6 mg (05/10/2019), 6 mg (06/07/2019) pegfilgrastim-cbqv (UDENYCA) injection 6 mg, 6 mg, Subcutaneous, Once, 1 of 1 cycle Administration: 6 mg (09/29/2018) cabazitaxel (JEVTANA) 35 mg in dextrose 5 % 250 mL chemo infusion, 15 mg/m2 = 35 mg (100 % of original dose 15 mg/m2), Intravenous,  Once, 10 of 10 cycles Dose modification: 15 mg/m2 (original dose 15 mg/m2, Cycle 1, Reason: Provider Judgment), 20 mg/m2 (original dose 15 mg/m2, Cycle 2, Reason: Provider Judgment), 20 mg/m2 (original dose 20 mg/m2, Cycle 5, Reason: Patient Age, Comment: Change in jevtana orderable), 15 mg/m2 (75 % of original dose 20 mg/m2, Cycle 8, Reason: Other (see comments), Comment: nausea, worsening numbness) Administration: 35 mg (09/27/2018), 46 mg (10/25/2018), 46 mg (11/23/2018), 46 mg (12/21/2018), 46 mg (01/18/2019), 46 mg (02/15/2019), 46 mg (03/15/2019), 35 mg (04/12/2019), 35 mg (05/10/2019), 35 mg (06/07/2019)  for chemotherapy treatment.    11/21/2019 -  Chemotherapy   The patient had palonosetron (ALOXI) injection 0.25 mg, 0.25 mg, Intravenous,  Once, 10 of 12 cycles Administration: 0.25 mg  (11/21/2019), 0.25 mg (12/12/2019), 0.25 mg (01/03/2020), 0.25 mg (01/24/2020), 0.25 mg (02/14/2020), 0.25 mg (03/06/2020), 0.25 mg (03/27/2020), 0.25 mg (04/17/2020), 0.25 mg (05/09/2020), 0.25 mg (05/30/2020) pegfilgrastim (NEULASTA ONPRO KIT) injection 6 mg, 6 mg, Subcutaneous, Once, 10 of 12 cycles Administration: 6 mg (11/21/2019), 6 mg (12/12/2019), 6 mg (01/03/2020), 6 mg (01/24/2020), 6 mg (02/14/2020), 6 mg (03/06/2020), 6 mg (03/27/2020), 6 mg (04/17/2020), 6 mg (05/09/2020), 6 mg (05/30/2020) DOCEtaxel (TAXOTERE) 140 mg in sodium chloride 0.9 % 250 mL chemo infusion, 60 mg/m2 = 140 mg (80 % of original dose 75 mg/m2), Intravenous,  Once, 10 of 12 cycles Dose modification: 60 mg/m2 (80 % of original dose 75 mg/m2, Cycle 1, Reason: Other (see comments), Comment: based on previous tolerabiliity in 2017) Administration: 140 mg (11/21/2019), 140 mg (12/12/2019), 140 mg (01/03/2020), 140 mg (01/24/2020), 140 mg (02/14/2020), 140 mg (03/06/2020), 140 mg (03/27/2020), 140 mg (04/17/2020), 140 mg (05/09/2020), 140 mg (05/30/2020)  for chemotherapy treatment.    Bone metastases ([place])  12/26/2015 Initial Diagnosis   Bone metastases ([facility])   09/27/2018 - 06/07/2019 Chemotherapy   The patient had palonosetron (ALOXI) injection 0.25 mg, 0.25 mg, Intravenous,  Once, 10 of 10 cycles Administration: 0.25 mg (09/27/2018), 0.25 mg (10/25/2018), 0.25 mg (11/23/2018), 0.25 mg (12/21/2018), 0.25 mg (01/18/2019), 0.25 mg (02/15/2019), 0.25 mg (03/15/2019), 0.25 mg (04/12/2019), 0.25 mg (05/10/2019), 0.25 mg (06/07/2019) pegfilgrastim (NEULASTA ONPRO KIT) injection 6 mg, 6 mg, Subcutaneous, Once, 9 of 9 cycles Administration: 6 mg (10/25/2018), 6 mg (11/23/2018), 6 mg (12/21/2018), 6 mg (01/18/2019), 6 mg (02/15/2019), 6 mg (  03/15/2019), 6 mg (04/12/2019), 6 mg (05/10/2019), 6 mg (06/07/2019) pegfilgrastim-cbqv (UDENYCA) injection 6 mg, 6 mg, Subcutaneous, Once, 1 of 1 cycle Administration: 6 mg (09/29/2018) cabazitaxel (JEVTANA) 35 mg in dextrose 5 % 250 mL chemo  infusion, 15 mg/m2 = 35 mg (100 % of original dose 15 mg/m2), Intravenous,  Once, 10 of 10 cycles Dose modification: 15 mg/m2 (original dose 15 mg/m2, Cycle 1, Reason: Provider Judgment), 20 mg/m2 (original dose 15 mg/m2, Cycle 2, Reason: Provider Judgment), 20 mg/m2 (original dose 20 mg/m2, Cycle 5, Reason: Patient Age, Comment: Change in jevtana orderable), 15 mg/m2 (75 % of original dose 20 mg/m2, Cycle 8, Reason: Other (see comments), Comment: nausea, worsening numbness) Administration: 35 mg (09/27/2018), 46 mg (10/25/2018), 46 mg (11/23/2018), 46 mg (12/21/2018), 46 mg (01/18/2019), 46 mg (02/15/2019), 46 mg (03/15/2019), 35 mg (04/12/2019), 35 mg (05/10/2019), 35 mg (06/07/2019)  for chemotherapy treatment.    11/21/2019 -  Chemotherapy   The patient had palonosetron (ALOXI) injection 0.25 mg, 0.25 mg, Intravenous,  Once, 10 of 12 cycles Administration: 0.25 mg (11/21/2019), 0.25 mg (12/12/2019), 0.25 mg (01/03/2020), 0.25 mg (01/24/2020), 0.25 mg (02/14/2020), 0.25 mg (03/06/2020), 0.25 mg (03/27/2020), 0.25 mg (04/17/2020), 0.25 mg (05/09/2020), 0.25 mg (05/30/2020) pegfilgrastim (NEULASTA ONPRO KIT) injection 6 mg, 6 mg, Subcutaneous, Once, 10 of 12 cycles Administration: 6 mg (11/21/2019), 6 mg (12/12/2019), 6 mg (01/03/2020), 6 mg (01/24/2020), 6 mg (02/14/2020), 6 mg (03/06/2020), 6 mg (03/27/2020), 6 mg (04/17/2020), 6 mg (05/09/2020), 6 mg (05/30/2020) DOCEtaxel (TAXOTERE) 140 mg in sodium chloride 0.9 % 250 mL chemo infusion, 60 mg/m2 = 140 mg (80 % of original dose 75 mg/m2), Intravenous,  Once, 10 of 12 cycles Dose modification: 60 mg/m2 (80 % of original dose 75 mg/m2, Cycle 1, Reason: Other (see comments), Comment: based on previous tolerabiliity in 2017) Administration: 140 mg (11/21/2019), 140 mg (12/12/2019), 140 mg (01/03/2020), 140 mg (01/24/2020), 140 mg (02/14/2020), 140 mg (03/06/2020), 140 mg (03/27/2020), 140 mg (04/17/2020), 140 mg (05/09/2020), 140 mg (05/30/2020)  for chemotherapy treatment.      CANCER  STAGING: Cancer Staging Prostate carcinoma [facility]) Staging form: Prostate, AJCC 7th Edition - Clinical stage from 12/24/2015: Stage IV (TX, N0, M1b, PSA: 20 or greater) - Signed by Baird Cancer, PA-C on 01/07/2016   INTERVAL HISTORY:  [name], a 64 y.o. male, returns for routine follow-up and consideration for next cycle of chemotherapy. Mannie was last seen on 05/30/2020.  Due to his worsening PSA and overall condition, he was instructed to do another image before continuing chemo. He is here for FU with his daughter, had repeat scans. He says pain in the shoulder on the right and bilateral hips is terrible.He is taking fentanyl patches and oxycodone and these barely help him but at least makes it tolerable. Besides pain, he denies any complaints for me today. He has no appetite, no energy and also experiences some nausea He feels dizzy when he stands up and noticed some headaches and nasal drip Rest of the pertinent 10 point ROS reviewed and negative.  PAST MEDICAL/SURGICAL HISTORY:  Past Medical History:  Diagnosis Date  . Arthritis    Left knee  . Bone cancer ([place])   . Bone metastases ([place]) 12/26/2015  . Cancer [facility])    Prostate  . DVT (deep venous thrombosis) ([place]) 2004  . Family history of prostate cancer   . Family history of stomach cancer   . Kidney stones    ~2002  . Prostate carcinoma ([place])  12/26/2015   Past Surgical History:  Procedure Laterality Date  . CIRCUMCISION  30 years ago  . MULTIPLE EXTRACTIONS WITH ALVEOLOPLASTY N/A 03/06/2016   Procedure: Extraction of tooth #'s 3,5,6,8-12,20-22, 27, 28, and 31 with alveoloplasty and bilateral mandibular tori reductions;  Surgeon: Lenn Cal, DDS;  Location: [place];  Service: Oral Surgery;  Laterality: N/A;    SOCIAL HISTORY:  Social History   Socioeconomic History  . Marital status: Single    Spouse name: Not on file  . Number of children: 2  . Years of education: Not on file  . Highest education  level: Not on file  Occupational History  . Occupation: Construction/carpenter  Tobacco Use  . Smoking status: Current Every Day Smoker    Packs/day: 1.00    Years: 50.00    Pack years: 50.00    Types: Cigarettes    Start date: 06/16/1965  . Smokeless tobacco: Former Systems developer    Types: [place] date: 08/04/2005  Substance and Sexual Activity  . Alcohol use: No    Alcohol/week: 0.0 standard drinks  . Drug use: Yes    Types: Marijuana    Comment: THC for appetite; occ  . Sexual activity: Not on file  Other Topics Concern  . Not on file  Social History Narrative  . Not on file   Social Determinants of Health   Financial Resource Strain: Not on file  Food Insecurity: Not on file  Transportation Needs: Not on file  Physical Activity: Not on file  Stress: Not on file  Social Connections: Not on file  Intimate Partner Violence: Not on file    FAMILY HISTORY:  Family History  Problem Relation Age of Onset  . Stomach cancer Mother        dx 52s  . Aneurysm Maternal Aunt        brain  . Alcohol abuse Maternal Uncle   . Stomach cancer Maternal Grandmother        dx 50s-60s  . Prostate cancer Maternal Uncle        dx 17s    CURRENT MEDICATIONS:  Current Outpatient Medications  Medication Sig Dispense Refill  . DOCEtaxel (TAXOTERE IV) Inject 75 mg/m2 into the vein every 21 ( twenty-one) days.     Marland Kitchen docusate sodium (COLACE) 100 MG capsule Take 2 capsules daily to help soften the stool.  It this does not work, you can increase to 2 capsules in the morning and 2 capsules in the evening.  If you do not have a bowel movement please call the [facility]. 30 capsule 1  . fentaNYL (DURAGESIC) 75 MCG/HR Place 1 patch onto the skin every 3 (three) days. 10 patch 0  . gabapentin (NEURONTIN) 300 MG capsule Take 1 capsule (300 mg total) by mouth at bedtime. 30 capsule 3  . Methylnaltrexone Bromide (RELISTOR) 150 MG TABS Take 1 tablet by mouth daily. 30 tablet 0  . naloxegol  oxalate (MOVANTIK) 25 MG TABS tablet Take 1 tablet (25 mg total) by mouth every other day as needed. 10 tablet 1  . NARCAN 4 MG/0.1ML LIQD nasal spray kit Place 1 spray into the nose as needed.     Marland Kitchen omeprazole (PRILOSEC) 20 MG capsule Take 1 capsule (20 mg total) by mouth 2 (two) times daily. 60 capsule 2  . ondansetron (ZOFRAN) 8 MG tablet Take every 8 hours as needed 30 tablet 6  . Oxycodone HCl 10 MG TABS Take 1 tablet (10  mg total) by mouth every 6 (six) hours as needed. 60 tablet 0  . predniSONE (DELTASONE) 5 MG tablet Take 1 tablet (5 mg total) by mouth daily with breakfast. 30 tablet 3  . prochlorperazine (COMPAZINE) 10 MG tablet Take 1 tablet (10 mg total) by mouth every 6 (six) hours as needed for nausea or vomiting. (Patient not taking: Reported on 06/20/2020) 60 tablet 6  . silodosin (RAPAFLO) 8 MG CAPS capsule Take 1 capsule (8 mg total) by mouth daily with breakfast. 30 capsule 11  . tamsulosin (FLOMAX) 0.4 MG CAPS capsule Take 2 capsules (0.8 mg total) by mouth at bedtime. 60 capsule 2  . traZODone (DESYREL) 100 MG tablet Take 1 tablet (100 mg total) by mouth at bedtime as needed for sleep. 30 tablet 3   No current facility-administered medications for this visit.   Facility-Administered Medications Ordered in Other Visits  Medication Dose Route Frequency Provider Last Rate Last Admin  . sodium chloride flush (NS) 0.9 % injection 10 mL  10 mL Intracatheter PRN Derek Jack, MD   10 mL at 01/18/19 0815    ALLERGIES:  Allergies  Allergen Reactions  . Xgeva [Denosumab]     Dental issues with previous doses  . Meloxicam Other (See Comments)    Caused stomach bleeding.    PHYSICAL EXAM:  Performance status (ECOG): 1 - Symptomatic but completely ambulatory  There were no vitals filed for this visit. Wt Readings from Last 3 Encounters:  06/20/20 203 lb 3.2 oz (92.2 kg)  05/30/20 208 lb 1.6 oz (94.4 kg)  05/09/20 209 lb 11.2 oz (95.1 kg)   Physical Exam Vitals  reviewed.  Constitutional:      Appearance: Normal appearance.  Musculoskeletal:        General: Tenderness present.       Arms:       Legs:  Neurological:     General: No focal deficit present.     Mental Status: He is alert and oriented to person, place, and time.  Psychiatric:        Mood and Affect: Mood normal.        Behavior: Behavior normal.     LABORATORY DATA:  I have reviewed the labs as listed.  CBC Latest Ref Rng & Units 06/20/2020 05/30/2020 05/09/2020  WBC 4.0 - 10.5 K/uL 9.6 10.1 10.3  Hemoglobin 13.0 - 17.0 g/dL 11.2(L) 11.9(L) 11.7(L)  Hematocrit 39.0 - 52.0 % 34.7(L) 36.6(L) 36.3(L)  Platelets 150 - 400 K/uL 231 228 226   CMP Latest Ref Rng & Units 06/20/2020 05/30/2020 05/09/2020  Glucose 70 - 99 mg/dL 119(H) 106(H) 132(H)  BUN 8 - 23 mg/dL '10 9 8  ' Creatinine 0.61 - 1.24 mg/dL 1.00 0.86 0.88  Sodium 135 - 145 mmol/L 138 134(L) 136  Potassium 3.5 - 5.1 mmol/L 3.9 3.7 3.3(L)  Chloride 98 - 111 mmol/L 100 101 100  CO2 22 - 32 mmol/L '28 26 29  ' Calcium 8.9 - 10.3 mg/dL 8.8(L) 8.5(L) 8.7(L)  Total Protein 6.5 - 8.1 g/dL 6.4(L) 6.2(L) 6.1(L)  Total Bilirubin 0.3 - 1.2 mg/dL 0.4 0.4 0.6  Alkaline Phos 38 - 126 U/L 94 97 85  AST 15 - 41 U/L 11(L) 11(L) 10(L)  ALT 0 - 44 U/L '7 8 8   ' Lab Results  Component Value Date   PSA 224.00 (H) 05/30/2020   PSA 171.00 (H) 05/09/2020   PSA 136.03 (H) 04/17/2020    DIAGNOSTIC IMAGING:  I have independently reviewed the scans and  discussed with the patient. NM Bone Scan Whole Body  Result Date: 07/17/2020 CLINICAL DATA:  Restaging prostate cancer. Patient reports back pain, left shoulder pain and bilateral hip pain. EXAM: NUCLEAR MEDICINE WHOLE BODY BONE SCAN TECHNIQUE: Whole body anterior and posterior images were obtained approximately 3 hours after intravenous injection of radiopharmaceutical. RADIOPHARMACEUTICALS:  18.1 mCi Technetium-57mMDP IV COMPARISON:  05/07/2020 FINDINGS: Multifocal abnormal increased  radiotracer uptake within the axial and proximal appendicular skeleton is again noted compatible with diffuse bone metastases. When compared with the previous exam there is been interval progression of bilateral posterior rib metastases. Within the pelvis, similar appearance of abnormal uptake within the lower thoracic and lower lumbar spine. Stable metastasis scratch set stable lesion within the left anterior iliac spine. Metastasis within the right anterior iliac spine, right acetabulum is unchanged. Metastasis within the intertrochanteric portion of the proximal right femur is stable. Increased tracer uptake associated with metastasis within the mid shaft of the right femur. Similar metastasis involving the mid and distal shaft of the left femur. Stable metastatic disease within the mid shaft of right humerus. Unchanged metastatic lesions within both shoulders. No significant change in bilateral calvarial metastases. Physiologic tracer activity is seen within both kidneys an urinary bladder. IMPRESSION: 1. Persistent multifocal abnormal areas of increased uptake compatible with widespread bony metastasis. When compared with the previous exam there has been interval progression of bilateral posterior rib metastases. Other areas of abnormal uptake are similar to previous exam. Electronically Signed   By: TKerby MoorsM.D.   On: 07/17/2020 16:28   CT Abdomen Pelvis W Contrast  Result Date: 07/17/2020 CLINICAL DATA:  Prostate cancer, assess treatment response, osseous metastatic disease, status post chemotherapy and radiation EXAM: CT ABDOMEN AND PELVIS WITH CONTRAST TECHNIQUE: Multidetector CT imaging of the abdomen and pelvis was performed using the standard protocol following bolus administration of intravenous contrast. CONTRAST:  1021mOMNIPAQUE IOHEXOL 300 MG/ML SOLN, additional oral enteric contrast COMPARISON:  08/11/2019 FINDINGS: Lower chest: No acute abnormality. Unchanged elevation of the left  hemidiaphragm. Hepatobiliary: No solid liver abnormality is seen. No gallstones, gallbladder wall thickening, or biliary dilatation. Pancreas: Unremarkable. No pancreatic ductal dilatation or surrounding inflammatory changes. Spleen: Normal in size without significant abnormality. Adrenals/Urinary Tract: Adrenal glands are unremarkable. Exophytic cysts of the inferior pole of the right kidney. Small nonobstructive bilateral renal calculi. Bladder is unremarkable. Stomach/Bowel: Stomach is within normal limits. Appendix appears normal. No evidence of bowel wall thickening, distention, or inflammatory changes. Moderate burden of stool throughout the colon rectum. Vascular/Lymphatic: Slight interval enlargement of an infrarenal abdominal aortic aneurysm measuring up to 5.0 x 4.9 cm with a large burden of eccentric mural thrombus. No enlarged abdominal or pelvic lymph nodes. Reproductive: No mass or other significant abnormality. Other: No abdominal wall hernia or abnormality. No abdominopelvic ascites. Musculoskeletal: Interval increase in extensive sclerotic osseous metastatic disease. IMPRESSION: 1. Interval increase in extensive sclerotic osseous metastatic disease throughout the included osseous structures. 2. No evidence of overt mass, lymphadenopathy, or soft tissue metastatic disease in the abdomen or pelvis. 3. Continued slight interval enlargement of an infrarenal abdominal aortic aneurysm measuring up to 5.0 x 4.9 cm with a large burden of eccentric mural thrombus. Recommend follow-up CT/MR every 6 months and vascular consultation. This recommendation follows ACR consensus guidelines: White Paper of the ACR Incidental Findings Committee II on Vascular Findings. J Am Coll Radiol 2013; 10:789-794. 4. Nonobstructive bilateral nephrolithiasis Aortic Atherosclerosis (ICD10-I70.0). Electronically Signed   By: AlDorna Bloom.  On: 07/17/2020 15:16     ASSESSMENT:  1. 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 -Abiraterone/prednisone, cabazitaxel with progression. -Guardant 360 showed ATM mutation. -Olaparib from 07/28/2019 through 11/03/2019 with progression. -Bone scan on 11/10/2019 showed worsening disease. PSA was 132 on 11/21/2019. -CT scan on 08/11/2019 showed bone metastasis with no visceral metastasis. -Docetaxel 60 mg per metered square started on 11/21/2019. -Bone scan on 01/23/2020 showed stable osseous metastatic disease. -Bone scan on 05/07/2020 shows multifocal abnormal uptake consistent with widespread bony metastatic disease, stable.  No new foci. - Bone scan 07/17/2020 showed persistent multifocal abnormal areas of increased uptake compatible with wide spread bony metastasis.When compared with the previous exam there has been interval progression of bilateral posterior rib metastases.   PLAN:   1. 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 -Discussed about mitoxantrone and radium-223 vs comfort care Sine he has predominantly bone mets and since he is frail, Trudi Ida may be a reasonable choice. - I explained that xofigo added PFS of about 4 months in the study and is given IV every 4 weeks for 6 cycles. - Dr Raliegh Ip has offered mitoxantrone as another option. We discussed about the cardiotoxicity from the drug. He will discuss this with Dr Raliegh Ip as well next week  2. Bone Metastatic Disease: -Denosumab on hold indefinitely secondary to ONJ.  3. Back Pain: -Continue fentanyl 75 mcg patch.  He could not tolerate any higher dose. Refilled this today -He is on oxycodone 10 mg every 6 hours as needed.  4.  Opioid constipation: On Movantik  5. Peripheral neuropathy: -Continue gabapentin at bedtime.   Orders placed this encounter:  No orders of the defined types were placed in this  encounter.  Benay Pike MD  I spent over 40 minutes in the care of this patient including H and P, review of records, discussion about imaging reports,prognosis , treatment options and hospice.  Benay Pike MD

## 2020-07-26 NOTE — Patient Instructions (Signed)
[facility] at [facility] Discharge Instructions  You were seen today by Dr. Iruku. Follow up as scheduled.   Thank you for choosing [facility] at [facility] to provide your oncology and hematology care.  To afford each patient quality time with our provider, please arrive at least 15 minutes before your scheduled appointment time.   If you have a lab appointment with the Cancer Center please come in thru the Main Entrance and check in at the main information desk.  You need to re-schedule your appointment should you arrive 10 or more minutes late.  We strive to give you quality time with our providers, and arriving late affects you and other patients whose appointments are after yours.  Also, if you no show three or more times for appointments you may be dismissed from the clinic at the providers discretion.     Again, thank you for choosing [facility].  Our hope is that these requests will decrease the amount of time that you wait before being seen by our physicians.       _____________________________________________________________  Should you have questions after your visit to [facility], please contact our office at [phone] and follow the prompts.  Our office hours are 8:00 a.m. and 4:30 p.m. Monday - Friday.  Please note that voicemails left after 4:00 p.m. may not be returned until the following business day.  We are closed weekends and major holidays.  You do have access to a nurse 24-7, just call the main number to the clinic [phone] and do not press any options, hold on the line and a nurse will answer the phone.    For prescription refill requests, have your pharmacy contact our office and allow 72 hours.    Due to Covid, you will need to wear a mask upon entering the hospital. If you do not have a mask, a mask will be given to you at the Main Entrance upon arrival. For doctor visits, patients may  have 1 support person age 18 or older with them. For treatment visits, patients can not have anyone with them due to social distancing guidelines and our immunocompromised population.     

## 2020-08-02 ENCOUNTER — Other Ambulatory Visit (HOSPITAL_COMMUNITY): Payer: Self-pay | Admitting: *Deleted

## 2020-08-02 MED ORDER — OXYCODONE HCL 10 MG PO TABS: 10.0000 mg | ORAL_TABLET | Freq: Four times a day (QID) | ORAL | 0 refills | Status: DC | PRN

## 2020-08-06 ENCOUNTER — Ambulatory Visit (HOSPITAL_COMMUNITY): Payer: Medicare Other | Admitting: Hematology

## 2020-08-09 ENCOUNTER — Other Ambulatory Visit: Payer: Self-pay

## 2020-08-09 ENCOUNTER — Inpatient Hospital Stay (HOSPITAL_COMMUNITY): Payer: Medicare Other | Attending: Hematology | Admitting: Hematology

## 2020-08-09 VITALS — BP 107/56 | HR 109 | Temp 98.9°F | Resp 18 | Wt 194.4 lb

## 2020-08-09 DIAGNOSIS — M791 Myalgia, unspecified site: Secondary | ICD-10-CM | POA: Diagnosis not present

## 2020-08-09 DIAGNOSIS — C61 Malignant neoplasm of prostate: Secondary | ICD-10-CM | POA: Diagnosis not present

## 2020-08-09 DIAGNOSIS — C7951 Secondary malignant neoplasm of bone: Secondary | ICD-10-CM | POA: Diagnosis not present

## 2020-08-09 DIAGNOSIS — Z5111 Encounter for antineoplastic chemotherapy: Secondary | ICD-10-CM | POA: Insufficient documentation

## 2020-08-09 DIAGNOSIS — M25551 Pain in right hip: Secondary | ICD-10-CM | POA: Insufficient documentation

## 2020-08-09 MED ORDER — METHYLPREDNISOLONE SODIUM SUCC 125 MG IJ SOLR
60.0000 mg | Freq: Once | INTRAMUSCULAR | Status: AC
  Administered 2020-08-09: 60 mg via INTRAMUSCULAR
  Filled 2020-08-09: qty 2

## 2020-08-09 MED ORDER — HYDROMORPHONE HCL 1 MG/ML IJ SOLN
2.0000 mg | Freq: Once | INTRAMUSCULAR | Status: AC
Start: 2020-08-09 — End: 2020-08-09
  Administered 2020-08-09: 2 mg via INTRAMUSCULAR
  Filled 2020-08-09: qty 2

## 2020-08-09 MED ORDER — LEUPROLIDE ACETATE (3 MONTH) 22.5 MG ~~LOC~~ KIT
22.5000 mg | PACK | Freq: Once | SUBCUTANEOUS | Status: AC
  Administered 2020-08-09: 22.5 mg via SUBCUTANEOUS
  Filled 2020-08-09: qty 22.5

## 2020-08-09 MED ORDER — MIRTAZAPINE 15 MG PO TABS: 15.0000 mg | ORAL_TABLET | Freq: Every day | ORAL | 1 refills | Status: AC

## 2020-08-09 MED ORDER — CYCLOBENZAPRINE HCL 5 MG PO TABS: 5.0000 mg | ORAL_TABLET | Freq: Three times a day (TID) | ORAL | 0 refills | Status: DC | PRN

## 2020-08-09 NOTE — Progress Notes (Signed)
Matthew [name], [place] [address]   CLINIC:  Medical Oncology/Hematology  PCP:  [name], [place] / [place] [address] [phone]   REASON FOR VISIT:  Follow-up for metastatic prostate cancer to bone  PRIOR THERAPY:  1. Docetaxelx 6 cyclesfrom 01/07/2016 to 04/21/2016. 2. Olaparib from 07/28/2019 to 11/03/2019 with progression.  NGS Results: Not done  CURRENT THERAPY: Docetaxel & Aloxi every 3 weeks  BRIEF ONCOLOGIC HISTORY:  Oncology History  Prostate carcinoma ([place])  12/10/2015 Imaging   CTA chest, multiple thoracic and rib osseous lesions. no primary evident. Atherosclerosis including aortic and CAD   12/12/2015 Imaging   CT abdomen/pelvis with sclerotic osseous lesions througout the lumbar spine, sacrum, bilateral iliac bones and L proximal femur worrisome for sclerotic osseous mets,mild prostatomegaly, no LAD, Infrarenal 3.8 cm AAA   12/12/2015 Tumor Marker   PSA 153.85   12/18/2015 Imaging   Bone Scan Diffuse bony metastatic disease, posterior calvarium, sternum, thoracic, lumbar spine, bilateral ribs, L shoulder, bilateral bony pelvis, proximal L femur   12/24/2015 Initial Biopsy   CT biopsy of L iliac bone lesion   12/26/2015 Pathology Results   Bone, biopsy, left iliac - POSITIVE FOR METASTATIC ADENOCARCINOMA.   12/28/2015 - 09/01/2016 Chemotherapy   Firmagon instituted 240 mg    01/01/2016 Procedure   Port placed by IR.   01/07/2016 - 04/11/2016 Chemotherapy   Docetaxel every 21 days x 6 cycles with dose reductions due to tolerance   02/18/2016 Adverse Reaction   GI toxicity from docetaxel, dose reduced to 60 mg/m2   02/18/2016 Treatment Plan Change   Docetaxel dose reduced by 20%   03/06/2016 Procedure   Dr. Enrique Sack- Multiple extraction of tooth numbers 3, 5, 6, 8, 9, 10, 11, 12, 20, 21, 22, 27, 28, and 31. 4 Quadrants of alveoloplasty. Bilateral mandibular lingual tori reductions.   04/09/2016 Imaging    Brain MRI 1. No intracranial metastatic disease 2. No acute intracranial abnormality 3. Findings of chronic microvascular disease   05/14/2016 Imaging   CT C/A/P Stable appearing diffuse bony metastatic disease. No new significant disease is seen Innumerable patchy sclerotic osseous metastases throughout the axial and proximal appendicular skeleton, stable in size and distribution, significantly increased in density in the interval, likely reflecting treatment effect. 2. No new or progressive metastatic disease in the chest, abdomen or pelvis. 3. Bilateral perifissural pulmonary nodules are stable and probably benign. 4. Aortic atherosclerosis. Infrarenal 4.3 cm abdominal aortic aneurysm, minimally increased in size. Recommend followup by ultrasound in 1 year.   06/02/2016 Tumor Marker   PSA 0.99 ng/ml    - 07/31/2016 Radiation Therapy   Palliative XRT by Dr. Lianne Cure in [place].    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  The Multi-Gene Panel offered by [facility] includes sequencing and/or deletion duplication testing of the following 85 genes: AIP, ALK, APC, ATM, AXIN2,BAP1,  BARD1, BLM, BMPR1A, BRCA1, BRCA2, BRIP1, CASR, CDC73, CDH1, CDK4, CDKN1B, CDKN1C, CDKN2A (p14ARF), CDKN2A (p16INK4a), CEBPA, CHEK2, CTNNA1, DICER1, DIS3L2, EGFR (c.2369C>T, p.Thr790Met variant only), EPCAM (Deletion/duplication testing only), FH, FLCN, GATA2, GPC3, GREM1 (Promoter region deletion/duplication testing only), HOXB13 (c.251G>A, p.Gly84Glu), HRAS, KIT, MAX, MEN1, MET, MITF (c.952G>A, p.Glu318Lys variant only), MLH1, MSH2, MSH3, MSH6, MUTYH, NBN, NF1, NF2, NTHL1, PALB2, PDGFRA, PHOX2B, PMS2, POLD1, POLE, POT1, PRKAR1A, PTCH1, PTEN, RAD50, RAD51C, RAD51D, RB1, RECQL4, RET,  RNF43, RUNX1, SDHAF2, SDHA (sequence changes only), SDHB, SDHC, SDHD, SMAD4, SMARCA4, SMARCB1, SMARCE1,  STK11, SUFU, TERC, TERT, TMEM127, TP53, TSC1, TSC2, VHL, WRN and WT1.  The report date is August 24, 2018  This is not thought to increase the risk for colon cancer or other cancers, unless there is the presence of two pathogenic mutations.   09/27/2018 - 06/07/2019 Chemotherapy   The patient had palonosetron (ALOXI) injection 0.25 mg, 0.25 mg, Intravenous,  Once, 10 of 10 cycles Administration: 0.25 mg (09/27/2018), 0.25 mg (10/25/2018), 0.25 mg (11/23/2018), 0.25 mg (12/21/2018), 0.25 mg (01/18/2019), 0.25 mg (02/15/2019), 0.25 mg (03/15/2019), 0.25 mg (04/12/2019), 0.25 mg (05/10/2019), 0.25 mg (06/07/2019) pegfilgrastim (NEULASTA ONPRO KIT) injection 6 mg, 6 mg, Subcutaneous, Once, 9 of 9 cycles Administration: 6 mg (10/25/2018), 6 mg (11/23/2018), 6 mg (12/21/2018), 6 mg (01/18/2019), 6 mg (02/15/2019), 6 mg (03/15/2019), 6 mg (04/12/2019), 6 mg (05/10/2019), 6 mg (06/07/2019) pegfilgrastim-cbqv (UDENYCA) injection 6 mg, 6 mg, Subcutaneous, Once, 1 of 1 cycle Administration: 6 mg (09/29/2018) cabazitaxel (JEVTANA) 35 mg in dextrose 5 % 250 mL chemo infusion, 15 mg/m2 = 35 mg (100 % of original dose 15 mg/m2), Intravenous,  Once, 10 of 10 cycles Dose modification: 15 mg/m2 (original dose 15 mg/m2, Cycle 1, Reason: Provider Judgment), 20 mg/m2 (original dose 15 mg/m2, Cycle 2, Reason: Provider Judgment), 20 mg/m2 (original dose 20 mg/m2, Cycle 5, Reason: Patient Age, Comment: Change in jevtana orderable), 15 mg/m2 (75 % of original dose 20 mg/m2, Cycle 8, Reason: Other (see comments), Comment: nausea, worsening numbness) Administration: 35 mg (09/27/2018), 46 mg (10/25/2018), 46 mg (11/23/2018), 46 mg (12/21/2018), 46 mg (01/18/2019), 46 mg (02/15/2019), 46 mg (03/15/2019), 35 mg (04/12/2019), 35 mg (05/10/2019), 35 mg (06/07/2019)  for chemotherapy treatment.    11/21/2019 -  Chemotherapy   The patient had palonosetron (ALOXI) injection 0.25 mg, 0.25 mg, Intravenous,  Once, 10 of 12 cycles Administration: 0.25 mg (11/21/2019),  0.25 mg (12/12/2019), 0.25 mg (01/03/2020), 0.25 mg (01/24/2020), 0.25 mg (02/14/2020), 0.25 mg (03/06/2020), 0.25 mg (03/27/2020), 0.25 mg (04/17/2020), 0.25 mg (05/09/2020), 0.25 mg (05/30/2020) pegfilgrastim (NEULASTA ONPRO KIT) injection 6 mg, 6 mg, Subcutaneous, Once, 10 of 12 cycles Administration: 6 mg (11/21/2019), 6 mg (12/12/2019), 6 mg (01/03/2020), 6 mg (01/24/2020), 6 mg (02/14/2020), 6 mg (03/06/2020), 6 mg (03/27/2020), 6 mg (04/17/2020), 6 mg (05/09/2020), 6 mg (05/30/2020) DOCEtaxel (TAXOTERE) 140 mg in sodium chloride 0.9 % 250 mL chemo infusion, 60 mg/m2 = 140 mg (80 % of original dose 75 mg/m2), Intravenous,  Once, 10 of 12 cycles Dose modification: 60 mg/m2 (80 % of original dose 75 mg/m2, Cycle 1, Reason: Other (see comments), Comment: based on previous tolerabiliity in 2017) Administration: 140 mg (11/21/2019), 140 mg (12/12/2019), 140 mg (01/03/2020), 140 mg (01/24/2020), 140 mg (02/14/2020), 140 mg (03/06/2020), 140 mg (03/27/2020), 140 mg (04/17/2020), 140 mg (05/09/2020), 140 mg (05/30/2020)  for chemotherapy treatment.    Bone metastases ([place])  12/26/2015 Initial Diagnosis   Bone metastases ([place])   09/27/2018 - 06/07/2019 Chemotherapy   The patient had palonosetron (ALOXI) injection 0.25 mg, 0.25 mg, Intravenous,  Once, 10 of 10 cycles Administration: 0.25 mg (09/27/2018), 0.25 mg (10/25/2018), 0.25 mg (11/23/2018), 0.25 mg (12/21/2018), 0.25 mg (01/18/2019), 0.25 mg (02/15/2019), 0.25 mg (03/15/2019), 0.25 mg (04/12/2019), 0.25 mg (05/10/2019), 0.25 mg (06/07/2019) pegfilgrastim (NEULASTA ONPRO KIT) injection 6 mg, 6 mg, Subcutaneous, Once, 9 of 9 cycles Administration: 6 mg (10/25/2018), 6 mg (11/23/2018), 6 mg (12/21/2018), 6 mg (01/18/2019), 6 mg (02/15/2019), 6 mg (03/15/2019),  6 mg (04/12/2019), 6 mg (05/10/2019), 6 mg (06/07/2019) pegfilgrastim-cbqv (UDENYCA) injection 6 mg, 6 mg, Subcutaneous, Once, 1 of 1 cycle Administration: 6 mg (09/29/2018) cabazitaxel (JEVTANA) 35 mg in dextrose 5 % 250 mL chemo infusion, 15  mg/m2 = 35 mg (100 % of original dose 15 mg/m2), Intravenous,  Once, 10 of 10 cycles Dose modification: 15 mg/m2 (original dose 15 mg/m2, Cycle 1, Reason: Provider Judgment), 20 mg/m2 (original dose 15 mg/m2, Cycle 2, Reason: Provider Judgment), 20 mg/m2 (original dose 20 mg/m2, Cycle 5, Reason: Patient Age, Comment: Change in jevtana orderable), 15 mg/m2 (75 % of original dose 20 mg/m2, Cycle 8, Reason: Other (see comments), Comment: nausea, worsening numbness) Administration: 35 mg (09/27/2018), 46 mg (10/25/2018), 46 mg (11/23/2018), 46 mg (12/21/2018), 46 mg (01/18/2019), 46 mg (02/15/2019), 46 mg (03/15/2019), 35 mg (04/12/2019), 35 mg (05/10/2019), 35 mg (06/07/2019)  for chemotherapy treatment.    11/21/2019 -  Chemotherapy   The patient had palonosetron (ALOXI) injection 0.25 mg, 0.25 mg, Intravenous,  Once, 10 of 12 cycles Administration: 0.25 mg (11/21/2019), 0.25 mg (12/12/2019), 0.25 mg (01/03/2020), 0.25 mg (01/24/2020), 0.25 mg (02/14/2020), 0.25 mg (03/06/2020), 0.25 mg (03/27/2020), 0.25 mg (04/17/2020), 0.25 mg (05/09/2020), 0.25 mg (05/30/2020) pegfilgrastim (NEULASTA ONPRO KIT) injection 6 mg, 6 mg, Subcutaneous, Once, 10 of 12 cycles Administration: 6 mg (11/21/2019), 6 mg (12/12/2019), 6 mg (01/03/2020), 6 mg (01/24/2020), 6 mg (02/14/2020), 6 mg (03/06/2020), 6 mg (03/27/2020), 6 mg (04/17/2020), 6 mg (05/09/2020), 6 mg (05/30/2020) DOCEtaxel (TAXOTERE) 140 mg in sodium chloride 0.9 % 250 mL chemo infusion, 60 mg/m2 = 140 mg (80 % of original dose 75 mg/m2), Intravenous,  Once, 10 of 12 cycles Dose modification: 60 mg/m2 (80 % of original dose 75 mg/m2, Cycle 1, Reason: Other (see comments), Comment: based on previous tolerabiliity in 2017) Administration: 140 mg (11/21/2019), 140 mg (12/12/2019), 140 mg (01/03/2020), 140 mg (01/24/2020), 140 mg (02/14/2020), 140 mg (03/06/2020), 140 mg (03/27/2020), 140 mg (04/17/2020), 140 mg (05/09/2020), 140 mg (05/30/2020)  for chemotherapy treatment.      CANCER STAGING: Cancer  Staging Prostate carcinoma [facility]) Staging form: Prostate, AJCC 7th Edition - Clinical stage from 12/24/2015: Stage IV (TX, N0, M1b, PSA: 20 or greater) - Signed by Baird Cancer, PA-C on 01/07/2016   INTERVAL HISTORY:  Matthew [name], a 65 y.o. male, returns for routine follow-up of his metastatic prostate cancer to bone. Matthew [name] was last seen by Dr. Benay Pike on 07/26/2020.   Today he is accompanied by his sister and he reports feeling poorly. He complains of having muscle pain and right hip pain which has been worsening since 12/23. He continues applying fentanyl patches every 3 days and takes oxycodone 10 mg which is helping his sciatic nerve pain that radiating down his right hamstring, but not his hip pain. He tried ibuprofen which did not help. He sits on a heating pad and places another heating pad on his right thigh which helps his hip pain. He takes Movantik 1/2 tablet daily and he is having regular BM's.   REVIEW OF SYSTEMS:  Review of Systems  Constitutional: Positive for appetite change (25%) and fatigue (25%).  Gastrointestinal: Positive for abdominal pain and nausea.  Musculoskeletal: Positive for arthralgias (10/10 R hip pain) and back pain (R sciatic nerve pain).  All other systems reviewed and are negative.   PAST MEDICAL/SURGICAL HISTORY:  Past Medical History:  Diagnosis Date  . Arthritis    Left knee  . Bone cancer ([place])   .  Bone metastases ([place]) 12/26/2015  . Cancer [facility])    Prostate  . DVT (deep venous thrombosis) ([place]) 2004  . Family history of prostate cancer   . Family history of stomach cancer   . Kidney stones    ~2002  . Prostate carcinoma ([place]) 12/26/2015   Past Surgical History:  Procedure Laterality Date  . CIRCUMCISION  30 years ago  . MULTIPLE EXTRACTIONS WITH ALVEOLOPLASTY N/A 03/06/2016   Procedure: Extraction of tooth #'s 3,5,6,8-12,20-22, 27, 28, and 31 with alveoloplasty and bilateral mandibular tori reductions;  Surgeon: Lenn Cal, DDS;  Location: [facility];  Service: Oral Surgery;  Laterality: N/A;    SOCIAL HISTORY:  Social History   Socioeconomic History  . Marital status: Single    Spouse name: Not on file  . Number of children: 2  . Years of education: Not on file  . Highest education level: Not on file  Occupational History  . Occupation: Construction/carpenter  Tobacco Use  . Smoking status: Current Every Day Smoker    Packs/day: 1.00    Years: 50.00    Pack years: 50.00    Types: Cigarettes    Start date: 06/16/1965  . Smokeless tobacco: Former Systems developer    Types: [place] date: 08/04/2005  Substance and Sexual Activity  . Alcohol use: No    Alcohol/week: 0.0 standard drinks  . Drug use: Yes    Types: Marijuana    Comment: THC for appetite; occ  . Sexual activity: Not on file  Other Topics Concern  . Not on file  Social History Narrative  . Not on file   Social Determinants of Health   Financial Resource Strain: Not on file  Food Insecurity: Not on file  Transportation Needs: Not on file  Physical Activity: Not on file  Stress: Not on file  Social Connections: Not on file  Intimate Partner Violence: Not At Risk  . Fear of Current or Ex-Partner: No  . Emotionally Abused: No  . Physically Abused: No  . Sexually Abused: No    FAMILY HISTORY:  Family History  Problem Relation Age of Onset  . Stomach cancer Mother        dx 89s  . Aneurysm Maternal Aunt        brain  . Alcohol abuse Maternal Uncle   . Stomach cancer Maternal Grandmother        dx 50s-60s  . Prostate cancer Maternal Uncle        dx 50s    CURRENT MEDICATIONS:  Current Outpatient Medications  Medication Sig Dispense Refill  . cyclobenzaprine (FLEXERIL) 5 MG tablet Take 1 tablet (5 mg total) by mouth 3 (three) times daily as needed for muscle spasms. 30 tablet 0  . DOCEtaxel (TAXOTERE IV) Inject 75 mg/m2 into the vein every 21 ( twenty-one) days.     Marland Kitchen docusate sodium (COLACE) 100 MG capsule Take 2  capsules daily to help soften the stool.  It this does not work, you can increase to 2 capsules in the morning and 2 capsules in the evening.  If you do not have a bowel movement please call the [facility]. 30 capsule 1  . fentaNYL (DURAGESIC) 75 MCG/HR Place 1 patch onto the skin every 3 (three) days. 10 patch 0  . Methylnaltrexone Bromide (RELISTOR) 150 MG TABS Take 1 tablet by mouth daily. 30 tablet 0  . mirtazapine (REMERON) 15 MG tablet Take 1 tablet (15 mg total) by mouth  at bedtime. 30 tablet 1  . naloxegol oxalate (MOVANTIK) 25 MG TABS tablet Take 1 tablet (25 mg total) by mouth every other day as needed. 10 tablet 1  . NARCAN 4 MG/0.1ML LIQD nasal spray kit Place 1 spray into the nose as needed.     Marland Kitchen omeprazole (PRILOSEC) 20 MG capsule Take 1 capsule (20 mg total) by mouth 2 (two) times daily. 60 capsule 2  . ondansetron (ZOFRAN) 8 MG tablet Take every 8 hours as needed 30 tablet 6  . predniSONE (DELTASONE) 5 MG tablet Take 1 tablet (5 mg total) by mouth daily with breakfast. 30 tablet 3  . prochlorperazine (COMPAZINE) 10 MG tablet Take 1 tablet (10 mg total) by mouth every 6 (six) hours as needed for nausea or vomiting. 60 tablet 6  . silodosin (RAPAFLO) 8 MG CAPS capsule Take 1 capsule (8 mg total) by mouth daily with breakfast. 30 capsule 11  . tamsulosin (FLOMAX) 0.4 MG CAPS capsule Take 2 capsules (0.8 mg total) by mouth at bedtime. 60 capsule 2  . Oxycodone HCl 10 MG TABS Take 1 tablet (10 mg total) by mouth every 6 (six) hours as needed. (Patient not taking: Reported on 08/09/2020) 60 tablet 0   Current Facility-Administered Medications  Medication Dose Route Frequency Provider Last Rate Last Admin  . HYDROmorphone (DILAUDID) injection 2 mg  2 mg Intramuscular Once Derek Jack, MD      . methylPREDNISolone sodium succinate (SOLU-MEDROL) 125 mg/2 mL injection 60 mg  60 mg Intramuscular Once Derek Jack, MD       Facility-Administered Medications  Ordered in Other Visits  Medication Dose Route Frequency Provider Last Rate Last Admin  . sodium chloride flush (NS) 0.9 % injection 10 mL  10 mL Intracatheter PRN Derek Jack, MD   10 mL at 01/18/19 0815    ALLERGIES:  Allergies  Allergen Reactions  . Xgeva [Denosumab]     Dental issues with previous doses  . Meloxicam Other (See Comments)    Caused stomach bleeding.    PHYSICAL EXAM:  Performance status (ECOG): 1 - Symptomatic but completely ambulatory  Vitals:   08/09/20 1545  BP: (!) 107/56  Pulse: (!) 109  Resp: 18  Temp: 98.9 F (37.2 C)  SpO2: 97%   Wt Readings from Last 3 Encounters:  08/09/20 194 lb 6.4 oz (88.2 kg)  07/26/20 198 lb 1 oz (89.8 kg)  06/20/20 203 lb 3.2 oz (92.2 kg)   Physical Exam Vitals reviewed.  Constitutional:      Appearance: Normal appearance. He is ill-appearing.  Neurological:     General: No focal deficit present.     Mental Status: He is alert and oriented to person, place, and time.  Psychiatric:        Mood and Affect: Mood normal.        Behavior: Behavior normal.      LABORATORY DATA:  I have reviewed the labs as listed.  CBC Latest Ref Rng & Units 06/20/2020 05/30/2020 05/09/2020  WBC 4.0 - 10.5 K/uL 9.6 10.1 10.3  Hemoglobin 13.0 - 17.0 g/dL 11.2(L) 11.9(L) 11.7(L)  Hematocrit 39.0 - 52.0 % 34.7(L) 36.6(L) 36.3(L)  Platelets 150 - 400 K/uL 231 228 226   CMP Latest Ref Rng & Units 06/20/2020 05/30/2020 05/09/2020  Glucose 70 - 99 mg/dL 119(H) 106(H) 132(H)  BUN 8 - 23 mg/dL _0 Creatinine 0.61 - 1.24 mg/dL 1.00 0.86 0.88  Sodium 135 - 145 mmol/L 138 134(L) 136  Potassium  3.5 - 5.1 mmol/L 3.9 3.7 3.3(L)  Chloride 98 - 111 mmol/L 100 101 100  CO2 22 - 32 mmol/L _0 Calcium 8.9 - 10.3 mg/dL 8.8(L) 8.5(L) 8.7(L)  Total Protein 6.5 - 8.1 g/dL 6.4(L) 6.2(L) 6.1(L)  Total Bilirubin 0.3 - 1.2 mg/dL 0.4 0.4 0.6  Alkaline Phos 38 - 126 U/L 94 97 85  AST 15 - 41 U/L 11(L) 11(L) 10(L)  ALT 0 - 44 U/L _1 DIAGNOSTIC IMAGING:  I have independently reviewed the scans and discussed with the patient. NM Bone Scan Whole Body  Result Date: 07/17/2020 CLINICAL DATA:  Restaging prostate cancer. Patient reports back pain, left shoulder pain and bilateral hip pain. EXAM: NUCLEAR MEDICINE WHOLE BODY BONE SCAN TECHNIQUE: Whole body anterior and posterior images were obtained approximately 3 hours after intravenous injection of radiopharmaceutical. RADIOPHARMACEUTICALS:  18.1 mCi Technetium-24mMDP IV COMPARISON:  05/07/2020 FINDINGS: Multifocal abnormal increased radiotracer uptake within the axial and proximal appendicular skeleton is again noted compatible with diffuse bone metastases. When compared with the previous exam there is been interval progression of bilateral posterior rib metastases. Within the pelvis, similar appearance of abnormal uptake within the lower thoracic and lower lumbar spine. Stable metastasis scratch set stable lesion within the left anterior iliac spine. Metastasis within the right anterior iliac spine, right acetabulum is unchanged. Metastasis within the intertrochanteric portion of the proximal right femur is stable. Increased tracer uptake associated with metastasis within the mid shaft of the right femur. Similar metastasis involving the mid and distal shaft of the left femur. Stable metastatic disease within the mid shaft of right humerus. Unchanged metastatic lesions within both shoulders. No significant change in bilateral calvarial metastases. Physiologic tracer activity is seen within both kidneys an urinary bladder. IMPRESSION: 1. Persistent multifocal abnormal areas of increased uptake compatible with widespread bony metastasis. When compared with the previous exam there has been interval progression of bilateral posterior rib metastases. Other areas of abnormal uptake are similar to previous exam. Electronically Signed   By: TKerby MoorsM.D.   On: 07/17/2020 16:28   CT  Abdomen Pelvis W Contrast  Result Date: 07/17/2020 CLINICAL DATA:  Prostate cancer, assess treatment response, osseous metastatic disease, status post chemotherapy and radiation EXAM: CT ABDOMEN AND PELVIS WITH CONTRAST TECHNIQUE: Multidetector CT imaging of the abdomen and pelvis was performed using the standard protocol following bolus administration of intravenous contrast. CONTRAST:  1022mOMNIPAQUE IOHEXOL 300 MG/ML SOLN, additional oral enteric contrast COMPARISON:  08/11/2019 FINDINGS: Lower chest: No acute abnormality. Unchanged elevation of the left hemidiaphragm. Hepatobiliary: No solid liver abnormality is seen. No gallstones, gallbladder wall thickening, or biliary dilatation. Pancreas: Unremarkable. No pancreatic ductal dilatation or surrounding inflammatory changes. Spleen: Normal in size without significant abnormality. Adrenals/Urinary Tract: Adrenal glands are unremarkable. Exophytic cysts of the inferior pole of the right kidney. Small nonobstructive bilateral renal calculi. Bladder is unremarkable. Stomach/Bowel: Stomach is within normal limits. Appendix appears normal. No evidence of bowel wall thickening, distention, or inflammatory changes. Moderate burden of stool throughout the colon rectum. Vascular/Lymphatic: Slight interval enlargement of an infrarenal abdominal aortic aneurysm measuring up to 5.0 x 4.9 cm with a large burden of eccentric mural thrombus. No enlarged abdominal or pelvic lymph nodes. Reproductive: No mass or other significant abnormality. Other: No abdominal wall hernia or abnormality. No abdominopelvic ascites. Musculoskeletal: Interval increase in extensive sclerotic osseous metastatic disease. IMPRESSION: 1. Interval increase in extensive sclerotic osseous metastatic disease throughout the included osseous  structures. 2. No evidence of overt mass, lymphadenopathy, or soft tissue metastatic disease in the abdomen or pelvis. 3. Continued slight interval enlargement of an  infrarenal abdominal aortic aneurysm measuring up to 5.0 x 4.9 cm with a large burden of eccentric mural thrombus. Recommend follow-up CT/MR every 6 months and vascular consultation. This recommendation follows ACR consensus guidelines: White Paper of the ACR Incidental Findings Committee II on Vascular Findings. J Am Coll Radiol 2013; 10:789-794. 4. Nonobstructive bilateral nephrolithiasis Aortic Atherosclerosis (ICD10-I70.0). Electronically Signed   By: Eddie Candle M.D.   On: 07/17/2020 15:16     ASSESSMENT:  1. 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 -Abiraterone/prednisone, cabazitaxel with progression. -Guardant 360 showed ATM mutation. -Olaparib from 07/28/2019 through 11/03/2019 with progression. -Bone scan on 11/10/2019 showed worsening disease. PSA was 132 on 11/21/2019. -CT scan on 08/11/2019 showed bone metastasis with no visceral metastasis. -Docetaxel 60 mg per metered square started on 11/21/2019. -Bone scan on 01/23/2020 showed stable osseous metastatic disease. -Bone scan on 05/07/2020 shows multifocal abnormal uptake consistent with widespread bony metastatic disease, stable. No new foci. -CTAP on 07/17/2020 with interval increase in extensive sclerotic bone metastasis.  No evidence of lymphadenopathy or soft tissue metastatic disease. -Bone scan on 07/17/2020 showed persistent multifocal abnormal areas of increased uptake with interval progression of bilateral posterior rib metastasis.   PLAN:  1. 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  PSA is 271. -Reviewed his scans which showed worsening bone meta stasis.  No visceral metastasis. -We have discussed various options including radium-223 and best supportive care in the form of hospice. -We will make a referral to Dr. Tammi Klippel for radium-223. -He will receive [name] injection today.   He will be seen back in 1 month. -We have sent a prescription for Remeron 15 mg at bedtime for depression as well as appetite stimulation. -His daughter was on the phone during the entire visit.  2. Bone Metastatic Disease: -Denosumab indefinitely held secondary to ONJ.  3. Back Pain: -Continue fentanyl 75 mcg patch. -Continue oxycodone 10 mg every 6 hours as needed. -Oxycodone is not helping right hip pain. -He will receive Solu-Medrol 60 mg IM and Dilaudid 2 mg IM for acute severe right hip pain. -I have also sent prescription for Flexeril 5 mg 3 times daily as needed as he complained of anterior thigh muscular pain.  4.  Opioid constipation: -He is taking Movantik half tablet every other day which is helping.  5. Peripheral neuropathy: -Continue gabapentin at bedtime.   Orders placed this encounter:  No orders of the defined types were placed in this encounter.    Derek Jack, MD [place] [phone]   I, Milinda Antis, am acting as a scribe for Dr. Sanda Linger.  I, Derek Jack MD, have reviewed the above documentation for accuracy and completeness, and I agree with the above.

## 2020-08-09 NOTE — Progress Notes (Signed)
DRAXTON LUU presents today for office visit and injection per the provider's orders.  [name], Solumedrol and Dilaudid administration without incident; Right buttock, Left Arm, and Right Deltoid  injection site WNL; see MAR for injection details.  Patient tolerated procedure well and without incident.  No questions or complaints noted at this time.  Patient discharged in stable condition with family member.

## 2020-08-09 NOTE — Patient Instructions (Signed)
[facility] at [facility] Discharge Instructions  You were seen today by Dr. Ellin Saba. He went over your recent results and scans. You received a steroid and Dilaudid injection into your muscles to help with the pain, along with your [name] injection. You will also be prescribed Flexeril to take three times daily as needed and Remeron to take at bedtime. You will be referred to [facility] for radium-223 treatments. Dr. Ellin Saba will see you back in 4 weeks for follow up.   Thank you for choosing [facility] at [facility] to provide your oncology and hematology care.  To afford each patient quality time with our provider, please arrive at least 15 minutes before your scheduled appointment time.   If you have a lab appointment with the Cancer Center please come in thru the Main Entrance and check in at the main information desk  You need to re-schedule your appointment should you arrive 10 or more minutes late.  We strive to give you quality time with our providers, and arriving late affects you and other patients whose appointments are after yours.  Also, if you no show three or more times for appointments you may be dismissed from the clinic at the providers discretion.     Again, thank you for choosing [facility].  Our hope is that these requests will decrease the amount of time that you wait before being seen by our physicians.       _____________________________________________________________  Should you have questions after your visit to [facility], please contact our office at [phone] between the hours of 8:00 a.m. and 4:30 p.m.  Voicemails left after 4:00 p.m. will not be returned until the following business day.  For prescription refill requests, have your pharmacy contact our office and allow 72 hours.    Cancer Center Support Programs:   > Cancer Support Group  2nd Tuesday of the month  1pm-2pm, Journey Room

## 2020-08-09 NOTE — Progress Notes (Signed)
Patient assessed and labs reviewed by Dr. Ellin Saba. Okay for [name] injection.

## 2020-08-27 ENCOUNTER — Encounter (HOSPITAL_COMMUNITY): Payer: Self-pay | Admitting: *Deleted

## 2020-08-27 ENCOUNTER — Other Ambulatory Visit (HOSPITAL_COMMUNITY): Payer: Self-pay | Admitting: *Deleted

## 2020-08-27 MED ORDER — OXYCODONE HCL 10 MG PO TABS: 10.0000 mg | ORAL_TABLET | Freq: Four times a day (QID) | ORAL | 0 refills | Status: DC | PRN

## 2020-08-27 NOTE — Progress Notes (Signed)
Histology and Location of Primary Cancer: Metastatic prostate cancer to bone  Location(s) of Symptomatic tumor(s): Right hip radiating down right hamstring  Past/Anticipated chemotherapy by medical oncology, if any:  REASON FOR VISIT:  Follow-up for metastatic prostate cancer to bone  PRIOR THERAPY:  1. Docetaxelx 6 cyclesfrom 01/07/2016 to 04/21/2016. 2. Olaparib from 07/28/2019 to 11/03/2019 with progression.  NGS Results: Not done  CURRENT THERAPY: Docetaxel & Aloxi every 3 weeks. Stopped chemotherapy 2 months ago. Received [name] 08/09/2020.   Patient's main complaints related to symptomatic tumor(s) are: Pain 10/10 despite Fentanyl 75 and oxycodone 10 mg  Pain on a scale of 0-10 is: 10/10 muscle pain, right hip pain radiating down right hamstring, right sciatic nerve pain    If Spine Met(s), symptoms, if any, include:  Bowel/Bladder retention or incontinence (please describe): Reports he dribbles urine. Reports intermittent urinary incontinence. Reports medication (Movantik) keeps his bowels regular.  Numbness or weakness in extremities (please describe): Numbness in right foot. Reports right foot feels heavy at times and difficult to pick up.  Current Decadron regimen, if applicable: denies  Ambulatory status? Walker? Wheelchair?: walker  SAFETY ISSUES:  Prior radiation? Yes, December 2017 sternum, low back, hip in [place]  Pacemaker/ICD? denies  Possible current pregnancy? No, male patient  Is the patient on methotrexate? denies  Additional Complaints / other details:  65 year old male. 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 Last [name] received 08/09/2020. Most recent PSA 271. Reports he has a port for chemo.

## 2020-08-27 NOTE — Progress Notes (Signed)
We received a voicemail from Matthew [name] with Matthew [name].  Patient's Movantik has been approved from 08/22/20 until 08/22/21.  No approval reference number provided.

## 2020-08-28 ENCOUNTER — Other Ambulatory Visit: Payer: Self-pay

## 2020-08-28 ENCOUNTER — Encounter: Payer: Self-pay | Admitting: Radiation Oncology

## 2020-08-28 ENCOUNTER — Ambulatory Visit
Admission: RE | Admit: 2020-08-28 | Discharge: 2020-08-28 | Disposition: A | Discharge status: Home / Self Care | Payer: Medicare Other | Source: Ambulatory Visit | Attending: Radiation Oncology | Admitting: Radiation Oncology

## 2020-08-28 VITALS — Ht 73.0 in | Wt 194.0 lb

## 2020-08-28 DIAGNOSIS — C61 Malignant neoplasm of prostate: Secondary | ICD-10-CM | POA: Insufficient documentation

## 2020-08-28 DIAGNOSIS — Z192 Hormone resistant malignancy status: Secondary | ICD-10-CM | POA: Insufficient documentation

## 2020-08-28 DIAGNOSIS — G893 Neoplasm related pain (acute) (chronic): Secondary | ICD-10-CM | POA: Diagnosis not present

## 2020-08-28 DIAGNOSIS — C7951 Secondary malignant neoplasm of bone: Secondary | ICD-10-CM

## 2020-08-28 NOTE — Progress Notes (Signed)
Radiation Oncology         [phone] ________________________________  Initial outpatient Consultation - Conducted via telephone due to current COVID-19 concerns for limiting patient exposure  Name: Matthew [name] MRN: [id number]  Date of Service: 08/28/2020 DOB: 1955/12/16  ER:DEYCXKG, Jenny Reichmann, MD  Derek Jack, MD   REFERRING PHYSICIAN: Derek Jack, MD  DIAGNOSIS: 65 y.o. 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    ICD-10-CM   1. Bone metastases ([place])  C79.51 Ambulatory referral to Radiation Oncology  2. Metastatic castration-resistant adenocarcinoma of prostate [facility])  C61 Ambulatory referral to Radiation Oncology   Z19.2     HISTORY OF PRESENT ILLNESS: Matthew [name] is a 65 y.o. male seen at the request of Dr. Delton Coombes. He was initially worked up for chest pressure and shortness of breath in 12/2015. Chest CT showed multiple thoracic and rib osseous lesions with no evident primary. CT A/P  followed and showed additional sclerotic osseous lesions throughout the lumbar spine, sacrum, bilateral iliac bones, and left proximal femur. While no lymphadenopathy or other findings of metastatic disease were seen, mild prostatomegaly was noted. Lab work performed the same day revealed a PSA of 153.85. This prompted a bone scan on 12/19/15 showing metastatic disease to the posterior calvarium, sternum, and left shoulder, in addition to previously seen lesions. He proceeded to CT biopsy of the left iliac bone lesion on 12/24/15, with pathology confirming metastatic adenocarcinoma. He was started on firmagon shortly thereafter.  A brain MRI was performed on April 09, 2016 to complete his disease staging and was negative for metastatic disease.  He briefly received docetaxel from 01/2016 through 04/11/16 but was unable to tolerate it despite multiple dose adjustments. Restaging CT C/A/P on 05/14/16 was stable, and PSA later  that month showed a positive response at 0.99. He was referred to Dr. Lianne Cure, in [place], and received 14-15 fractions of palliative radiation therapy to the sternum and left hip through 07/31/16. Since 09/2016, he has continued on [name] every 3 months.  His PSA began to rise despite the [name], up to 17.63 at the end of 11/2017, and restaging CT and bone scan showed slight worsening of bony metastases. He was started on Zytiga at the beginning of 12/2017, with the PSA responding well with a decrease to 1.95 by 01/2018. However, his PSA again began to rise following this, up to 7.5 by 08/2018. He was then switched to cabazitaxel, which he received from 09/2018 through 06/2019. Again, his disease progressed with a PSA rise up to 67 by 06/2019, and osseous disease progression was seen on restaging bone scan. In light of the progression, he was then switched to Olaparib, which he received from 07/2019 through 10/2019. The PSA continued to rise despite treatment with PSA of 132 in 11/2019 and bone scan showing worsening disease. He was placed back on docetaxel in 11/2019 which led to stable disease through 05/2020 but unfortunately the PSA has been gradually rising since that time, up to 224 on 05/30/20.   More recently, restaging CT A/P and bone scan from 07/17/20 showed interval progression of extensive sclerotic bone metastasis, especially in bilateral posterior rib metastases but no evidence of visceral disease. His most recent PSA on 06/20/20 was further elevated at 271. He has also had progressive worsening of right hip pain in the groin and extending into the mid thigh with obvious, extensive bony involvement on recent imaging. The pain is worse with any weightbearing activity and only minimally improved  with rest.  He has been on narcotic pain medication with Fentanyl 75 mcg patch and Oxycodone 77m for breakthrough pain which is no longer effective. He was given Flexeril 555mpo TID to use in addition to his Fentanyl  patch and oxycodone but still rates the right hip/leg pain 10/10 in severity. He denies numbness or tingling or focal weakness in the LEs and has not had recent changes in bowel or bladder function.    PREVIOUS RADIATION THERAPY: Yes  07/2016: 14-15 fractions of palliative XRT to the sternum, low back, left hip (Dr. [place]ianne [place] PAST MEDICAL HISTORY:  Past Medical History:  Diagnosis Date   Arthritis    Left knee   Bone cancer ([facility]   Bone metastases ([facility]/24/2017   Cancer ([facility]   Prostate   DVT (deep venous thrombosis) ([facility]   Family history of prostate cancer    Family history of stomach cancer    Kidney stones    ~2002   [place]state carcinoma [place]      PAST SURGICAL HISTORY: Past Surgical History:  Procedure Laterality Date   CIRCUMCISION  30 years ago   MULTIPLE EXTRACTIONS WITH ALVEOLOPLASTY N/A 03/06/2016   Procedure: Extraction of tooth #'s 3,5,6,8-12,20-22, 27, 28, and 31 with alveoloplasty and bilateral mandibular tori reductions;  Surgeon: RoLenn CalDDS;  [place] [facility] [place] Service: Oral Surgery;  Laterality: N/A;    FAMILY HISTORY:  Family History  Problem Relation Age of Onset   Stomach cancer Mother        dx 406s Aneurysm Maternal Aunt        brain   Alcohol abuse Maternal Uncle    Stomach cancer Maternal Grandmother        dx 50s-60s   Prostate cancer Maternal Uncle        dx 5029s  SOCIAL HISTORY:  Social History   Socioeconomic History   Marital status: Single    Spouse name: Not on file   Number of children: 2   Years of education: Not on file   Highest education level: Not on file  Occupational History   Occupation: CoResearch scientist (life sciences)  Comment: disabled  Tobacco Use   Smoking status: Current Every Day Smoker    Packs/day: 0.50    Years: 50.00    Pack years: 25.00    Types: Cigarettes    Start date: 06/16/1965   Smokeless tobacco: Former [employer] developer  Types: Chew    Quit date:  08/04/2005  Vaping Use   Vaping Use: Never used  Substance and Sexual Activity   Alcohol use: No    Alcohol/week: 0.0 standard drinks   Drug use: Yes    Types: Marijuana    Comment: THC for appetite; occ   Sexual activity: Not Currently  Other Topics Concern   Not on file  Social History Narrative   Not on file   Social Determinants of Health   Financial Resource Strain: Not on file  Food Insecurity: Not on file  Transportation Needs: Not on file  Physical Activity: Not on file  Stress: Not on file  Social Connections: Not on file  Intimate Partner Violence: Not At Risk   Fear of Current or Ex-Partner: No   Emotionally Abused: No   Physically Abused: No   Sexually Abused: No    ALLERGIES: Xgeva [denosumab] and Meloxicam  MEDICATIONS:  Current Outpatient Medications  Medication Sig Dispense Refill   cyclobenzaprine (  FLEXERIL) 5 MG tablet Take 1 tablet (5 mg total) by mouth 3 (three) times daily as needed for muscle spasms. 30 tablet 0   docusate sodium (COLACE) 100 MG capsule Take 2 capsules daily to help soften the stool.  It this does not work, you can increase to 2 capsules in the morning and 2 capsules in the evening.  If you do not have a bowel movement please call the [facility]. 30 capsule 1   fentaNYL (DURAGESIC) 75 MCG/HR Place 1 patch onto the skin every 3 (three) days. 10 patch 0   Methylnaltrexone Bromide (RELISTOR) 150 MG TABS Take 1 tablet by mouth daily. 30 tablet 0   mirtazapine (REMERON) 15 MG tablet Take 1 tablet (15 mg total) by mouth at bedtime. 30 tablet 1   naloxegol oxalate (MOVANTIK) 25 MG TABS tablet Take 1 tablet (25 mg total) by mouth every other day as needed. 10 tablet 1   NARCAN 4 MG/0.1ML LIQD nasal spray kit Place 1 spray into the nose as needed.      omeprazole (PRILOSEC) 20 MG capsule Take 1 capsule (20 mg total) by mouth 2 (two) times daily. 60 capsule 2   ondansetron (ZOFRAN) 8 MG tablet Take every 8 hours as  needed 30 tablet 6   Oxycodone HCl 10 MG TABS Take 1 tablet (10 mg total) by mouth every 6 (six) hours as needed. 60 tablet 0   predniSONE (DELTASONE) 5 MG tablet Take 1 tablet (5 mg total) by mouth daily with breakfast. 30 tablet 3   prochlorperazine (COMPAZINE) 10 MG tablet Take 1 tablet (10 mg total) by mouth every 6 (six) hours as needed for nausea or vomiting. 60 tablet 6   silodosin (RAPAFLO) 8 MG CAPS capsule Take 1 capsule (8 mg total) by mouth daily with breakfast. 30 capsule 11   tamsulosin (FLOMAX) 0.4 MG CAPS capsule Take 2 capsules (0.8 mg total) by mouth at bedtime. 60 capsule 2   No current facility-administered medications for this encounter.   Facility-Administered Medications Ordered in Other Encounters  Medication Dose Route Frequency Provider Last Rate Last Admin   sodium chloride flush (NS) 0.9 % injection 10 mL  10 mL Intracatheter PRN Derek Jack, MD   10 mL at 01/18/19 0815    REVIEW OF SYSTEMS:  On review of systems, the patient reports that he is doing well overall. He denies any chest pain, shortness of breath, cough, fevers, chills, night sweats, unintended weight changes. He denies any bowel disturbances, and denies abdominal pain, nausea or vomiting. He reports severe (10/10) pain in his right hip/groin that radiates down his posterior thigh, as well as right sciatic nerve pain intermittently. He endorses taking oxycodone and using fentanyl with no relief to that area. He also reports intermittent numbness in his right foot, noting that it feels heavy and difficult to pick up at times. He reports chronic weak urinary stream and intermittent urinary incontinence, unchanged recently. He endorses taking Movantik to keep his bowels regular while on his opioid pain medication. A complete review of systems is obtained and is otherwise negative.    PHYSICAL EXAM:  Wt Readings from Last 3 Encounters:  08/28/20 194 lb (88 kg)  08/09/20 194 lb 6.4 oz (88.2 kg)   07/26/20 198 lb 1 oz (89.8 kg)   Temp Readings from Last 3 Encounters:  08/09/20 98.9 F (37.2 C) (Oral)  07/26/20 98.7 F (37.1 C) (Oral)  06/20/20 (!) 96.9 F (36.1 C) (Temporal)  BP Readings from Last 3 Encounters:  08/09/20 (!) 107/56  07/26/20 104/66  06/20/20 98/65   Pulse Readings from Last 3 Encounters:  08/09/20 (!) 109  07/26/20 (!) 116  06/20/20 (!) 120   Pain Assessment Pain Score: 10-Worst pain ever Pain Frequency: Constant Pain Loc: Hip/10  Unable to assess due to telephone consult visit format.   KPS = 50  100 - Normal; no complaints; no evidence of disease. 90   - Able to carry on normal activity; minor signs or symptoms of disease. 80   - Normal activity with effort; some signs or symptoms of disease. 58   - Cares for self; unable to carry on normal activity or to do active work. 60   - Requires occasional assistance, but is able to care for most of his personal needs. 50   - Requires considerable assistance and frequent medical care. 21   - Disabled; requires special care and assistance. 70   - Severely disabled; hospital admission is indicated although death not imminent. 7   - Very sick; hospital admission necessary; active supportive treatment necessary. 10   - Moribund; fatal processes progressing rapidly. 0     - Dead  Karnofsky DA, Abelmann [place], Craver LS and Burchenal [facility] [phone]) The use of the nitrogen mustards in the palliative treatment of carcinoma: with particular reference to bronchogenic carcinoma Cancer 1 634-56  LABORATORY DATA:  Lab Results  Component Value Date   WBC 9.6 06/20/2020   HGB 11.2 (L) 06/20/2020   HCT 34.7 (L) 06/20/2020   MCV 96.7 06/20/2020   PLT 231 06/20/2020   Lab Results  Component Value Date   NA 138 06/20/2020   K 3.9 06/20/2020   CL 100 06/20/2020   CO2 28 06/20/2020   Lab Results  Component Value Date   ALT 7 06/20/2020   AST 11 (L) 06/20/2020   ALKPHOS 94 06/20/2020   BILITOT 0.4 06/20/2020      RADIOGRAPHY: No results found.    IMPRESSION/PLAN: This visit was conducted via telephone to spare the patient unnecessary potential exposure in the healthcare setting during the current COVID-19 pandemic. 1. 65 y.o. 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 Today, we talked to the patient and family about the findings and workup thus far. We discussed the natural history of metastatic prostate cancer and general treatment, highlighting the role of Xogifo infusions in the management. We focused on the details of logistics and delivery. The recommendation is to proceed with monthly infusions of Xofigo x6. We will monitor labs prior to each infusion to ensure it is safe to proceed with each treatment. We reviewed the anticipated acute and late sequelae associated with Xofigo in this setting. The patient and his daughter, Lenna Sciara, were encouraged to ask questions that were answered to their satisfaction.  We also discussed the role of palliative radiotherapy in the management of painful, site-specific osseous metastatic disease such as that in his right hip and proximal right femur.  There is extensive metastatic activity in this area on his most recent bone scan from December 2021 making this the likely source of his pain.  He has had prior palliative radiotherapy for painful lesions in 2017 with an excellent response.  Therefore, we discussed a 2-week course of daily palliative radiotherapy to the right hip/right proximal femur for palliation of his pain.  We reviewed the anticipated acute and late sequelae associated with radiation in this setting.  At the end of our  conversation, the patient elects to proceed with Xofigo infusions for management of the diffuse osseous metastases. We will share this information with Dr. Delton Coombes and proceed with treatment planning accordingly, in anticipation of beginning treatment in the near  future.  He is also in agreement with proceeding with a 2-week course of daily palliative radiotherapy to the right hip/proximal femur but would like to have the daily radiation treatments at [facility] in [place] since this is much closer to his home.  We will make a referral for urgent consult with Dr. Adella Nissen to discuss the palliative radiotherapy in [place].  We enjoyed meeting him and his daughter today and look forward to continuing to participate in his care.   Given current concerns for patient exposure during the COVID-19 pandemic, this encounter was conducted via telephone. The patient was notified in advance and was offered a [place] meeting to allow for face to face communication but unfortunately reported that he did not have the appropriate resources/technology to support such a visit and instead preferred to proceed with telephone consult. The patient has given verbal consent for this type of encounter. The time spent during this encounter was 60 minutes. The attendants for this meeting include Tyler Pita MD, Ashlyn Bruning PA-C, [place], patient Matthew [name] and his daughter. During the encounter, Tyler Pita MD, Ashlyn Bruning PA-C, and scribe, Wilburn Mylar were located at [place].  Patient Matthew [name] and his daughter were located at home.     Nicholos Johns, PA-C    Tyler Pita, MD  [facility] Direct Dial: [phone]   Fax: [phone] [email or web address]   Skype   [email or web address]   This document serves as a record of services personally performed by Tyler Pita, MD and Freeman Caldron, PA-C. It was created on their behalf by Wilburn Mylar, a trained medical scribe. The creation of this record is based on the scribe's personal observations and the provider's statements to them. This document has been checked and approved by the attending provider.

## 2020-08-29 NOTE — Addendum Note (Signed)
Encounter addended by: Tyler Pita, MD on: 08/29/2020 11:24 AM  Actions taken: Medication List reviewed, Problem List reviewed, Allergies reviewed, Clinical Note Signed

## 2020-08-29 NOTE — Progress Notes (Signed)
  Radiation Oncology         [phone] ________________________________  Name: NOLON YELLIN MRN: [id number]  Date: 08/28/2020  DOB: [birth year]  Xofigo Treatment Planning Note:  Diagnosis:  Castration resistant prostate cancer with painful bone involvement  Narrative: Mr.Zidane EQUAN COGBILL is a patient who has been diagnosed with castration resistant prostate cancer with painful bone involvement.  His most recent blood counts show that he remains a good candidate to proceed with Ra-223.  The patient is going to receive Xofigo for his treatment.   Radiation Treatment Planning:  The prescribed radiation activity will be 50 kBq per kg per infusions. The plan is to offer a total of 6 IV administrations of this agent, assuming the blood counts are adequate prior to each administration, with each infusion done at 4 week intervals.  This will be done as an IV administration in the nuclear medicine department, with care to undertake all radiation protection precautions as recommended.   ________________________________  Sheral Apley. Tammi Klippel, M.D.

## 2020-08-30 ENCOUNTER — Other Ambulatory Visit (HOSPITAL_COMMUNITY): Payer: Self-pay | Admitting: Radiation Oncology

## 2020-08-30 ENCOUNTER — Telehealth: Payer: Self-pay | Admitting: Radiation Oncology

## 2020-08-30 ENCOUNTER — Telehealth: Payer: Self-pay | Admitting: *Deleted

## 2020-08-30 DIAGNOSIS — C7951 Secondary malignant neoplasm of bone: Secondary | ICD-10-CM

## 2020-08-30 DIAGNOSIS — C61 Malignant neoplasm of prostate: Secondary | ICD-10-CM

## 2020-08-30 NOTE — Telephone Encounter (Signed)
Received voicemail message from patient's daughter, Lenna Sciara, inquiring about when her father is scheduled to start external beam radiation in [place]. Noted referral had been placed by Ashlyn Bruning, PA-C. Consulted with Romie Jumper who understands the patient is scheduled in [place] tomorrow at [phone]. Enid Derry committed to calling Melissa back with this information.

## 2020-08-30 NOTE — Telephone Encounter (Signed)
CALLED PATIENT'S DAUGHTER- MELISSA HAND TO INFORM OF [place] APPT. WITH DR. Adella Nissen ON 08-31-20 - ARRIVAL TIME- 10:15 AM , ADDRESS - [address], [place]. NO. - [phone], PATIENT'S DAUGHTER- MELISSA HAND VERIFIED UNDERSTANDING THIS APPT.

## 2020-08-30 NOTE — Telephone Encounter (Signed)
CALLED PATIENT'S DAUGHTER- MELISSA HAND TO INFORM OF LAB AND WEIGHT FOR 09-04-20 - ARRIVAL TIME- 12 PM @ [place]. FOR 09-11-20 - ARRIVAL TIME- 12:15 PM @ [facility], SPOKE WITH MS. HAND AND SHE IS AWARE OF THIS INJ.

## 2020-08-31 ENCOUNTER — Other Ambulatory Visit (HOSPITAL_COMMUNITY): Payer: Self-pay | Admitting: Hematology

## 2020-08-31 DIAGNOSIS — Z192 Hormone resistant malignancy status: Secondary | ICD-10-CM | POA: Diagnosis not present

## 2020-08-31 DIAGNOSIS — C7951 Secondary malignant neoplasm of bone: Secondary | ICD-10-CM | POA: Diagnosis not present

## 2020-08-31 DIAGNOSIS — Z923 Personal history of irradiation: Secondary | ICD-10-CM | POA: Diagnosis not present

## 2020-08-31 DIAGNOSIS — C61 Malignant neoplasm of prostate: Secondary | ICD-10-CM | POA: Diagnosis not present

## 2020-08-31 DIAGNOSIS — C7989 Secondary malignant neoplasm of other specified sites: Secondary | ICD-10-CM | POA: Diagnosis not present

## 2020-09-03 ENCOUNTER — Telehealth: Payer: Self-pay | Admitting: *Deleted

## 2020-09-03 ENCOUNTER — Other Ambulatory Visit: Payer: Self-pay | Admitting: Radiation Oncology

## 2020-09-03 DIAGNOSIS — C61 Malignant neoplasm of prostate: Secondary | ICD-10-CM | POA: Diagnosis not present

## 2020-09-03 DIAGNOSIS — Z923 Personal history of irradiation: Secondary | ICD-10-CM | POA: Diagnosis not present

## 2020-09-03 DIAGNOSIS — C7951 Secondary malignant neoplasm of bone: Secondary | ICD-10-CM | POA: Diagnosis not present

## 2020-09-03 DIAGNOSIS — Z192 Hormone resistant malignancy status: Secondary | ICD-10-CM | POA: Diagnosis not present

## 2020-09-03 DIAGNOSIS — C7989 Secondary malignant neoplasm of other specified sites: Secondary | ICD-10-CM | POA: Diagnosis not present

## 2020-09-03 NOTE — Telephone Encounter (Signed)
Called patient's daughter - Melissa Hand to remind of lab and weight appt. for 09-04-20- arrival time- 11:45 am @ [facility], spoke with patient's daughter- Scientist, research (physical sciences) and she is aware of these appts.

## 2020-09-04 ENCOUNTER — Other Ambulatory Visit: Payer: Self-pay

## 2020-09-04 ENCOUNTER — Other Ambulatory Visit (HOSPITAL_COMMUNITY): Payer: Self-pay

## 2020-09-04 ENCOUNTER — Ambulatory Visit
Admission: RE | Admit: 2020-09-04 | Discharge: 2020-09-04 | Disposition: A | Discharge status: Home / Self Care | Payer: Medicare Other | Source: Ambulatory Visit | Attending: Radiation Oncology | Admitting: Radiation Oncology

## 2020-09-04 DIAGNOSIS — Z192 Hormone resistant malignancy status: Secondary | ICD-10-CM | POA: Diagnosis not present

## 2020-09-04 DIAGNOSIS — C7951 Secondary malignant neoplasm of bone: Secondary | ICD-10-CM | POA: Diagnosis not present

## 2020-09-04 DIAGNOSIS — Z923 Personal history of irradiation: Secondary | ICD-10-CM | POA: Diagnosis not present

## 2020-09-04 DIAGNOSIS — C61 Malignant neoplasm of prostate: Secondary | ICD-10-CM

## 2020-09-04 DIAGNOSIS — C7989 Secondary malignant neoplasm of other specified sites: Secondary | ICD-10-CM | POA: Diagnosis not present

## 2020-09-04 DIAGNOSIS — G893 Neoplasm related pain (acute) (chronic): Secondary | ICD-10-CM

## 2020-09-04 LAB — CBC WITH DIFFERENTIAL (CANCER CENTER ONLY)
Abs Immature Granulocytes: 0.18 10*3/uL — ABNORMAL HIGH (ref 0.00–0.07)
Basophils Absolute: 0 10*3/uL (ref 0.0–0.1)
Basophils Relative: 0 %
Eosinophils Absolute: 0 10*3/uL (ref 0.0–0.5)
Eosinophils Relative: 1 %
HCT: 32.6 % — ABNORMAL LOW (ref 39.0–52.0)
Hemoglobin: 10.2 g/dL — ABNORMAL LOW (ref 13.0–17.0)
Immature Granulocytes: 3 %
Lymphocytes Relative: 9 %
Lymphs Abs: 0.5 10*3/uL — ABNORMAL LOW (ref 0.7–4.0)
MCH: 25.9 pg — ABNORMAL LOW (ref 26.0–34.0)
MCHC: 31.3 g/dL (ref 30.0–36.0)
MCV: 82.7 fL (ref 80.0–100.0)
Monocytes Absolute: 0.4 10*3/uL (ref 0.1–1.0)
Monocytes Relative: 8 %
Neutro Abs: 4.4 10*3/uL (ref 1.7–7.7)
Neutrophils Relative %: 79 %
Platelet Count: 242 10*3/uL (ref 150–400)
RBC: 3.94 MIL/uL — ABNORMAL LOW (ref 4.22–5.81)
RDW: 18.7 % — ABNORMAL HIGH (ref 11.5–15.5)
WBC Count: 5.6 10*3/uL (ref 4.0–10.5)
nRBC: 0 % (ref 0.0–0.2)

## 2020-09-04 MED ORDER — FENTANYL 75 MCG/HR TD PT72: 1.0000 | MEDICATED_PATCH | TRANSDERMAL | 0 refills | Status: DC

## 2020-09-05 DIAGNOSIS — C7989 Secondary malignant neoplasm of other specified sites: Secondary | ICD-10-CM | POA: Diagnosis not present

## 2020-09-05 DIAGNOSIS — C7951 Secondary malignant neoplasm of bone: Secondary | ICD-10-CM | POA: Diagnosis not present

## 2020-09-05 DIAGNOSIS — Z192 Hormone resistant malignancy status: Secondary | ICD-10-CM | POA: Diagnosis not present

## 2020-09-05 DIAGNOSIS — Z923 Personal history of irradiation: Secondary | ICD-10-CM | POA: Diagnosis not present

## 2020-09-05 DIAGNOSIS — C61 Malignant neoplasm of prostate: Secondary | ICD-10-CM | POA: Diagnosis not present

## 2020-09-06 DIAGNOSIS — Z192 Hormone resistant malignancy status: Secondary | ICD-10-CM | POA: Diagnosis not present

## 2020-09-06 DIAGNOSIS — C7989 Secondary malignant neoplasm of other specified sites: Secondary | ICD-10-CM | POA: Diagnosis not present

## 2020-09-06 DIAGNOSIS — Z923 Personal history of irradiation: Secondary | ICD-10-CM | POA: Diagnosis not present

## 2020-09-06 DIAGNOSIS — C7951 Secondary malignant neoplasm of bone: Secondary | ICD-10-CM | POA: Diagnosis not present

## 2020-09-06 DIAGNOSIS — C61 Malignant neoplasm of prostate: Secondary | ICD-10-CM | POA: Diagnosis not present

## 2020-09-07 DIAGNOSIS — C7989 Secondary malignant neoplasm of other specified sites: Secondary | ICD-10-CM | POA: Diagnosis not present

## 2020-09-07 DIAGNOSIS — Z192 Hormone resistant malignancy status: Secondary | ICD-10-CM | POA: Diagnosis not present

## 2020-09-07 DIAGNOSIS — M25551 Pain in right hip: Secondary | ICD-10-CM | POA: Diagnosis not present

## 2020-09-07 DIAGNOSIS — C7951 Secondary malignant neoplasm of bone: Secondary | ICD-10-CM | POA: Diagnosis not present

## 2020-09-07 DIAGNOSIS — Z923 Personal history of irradiation: Secondary | ICD-10-CM | POA: Diagnosis not present

## 2020-09-07 DIAGNOSIS — C61 Malignant neoplasm of prostate: Secondary | ICD-10-CM | POA: Diagnosis not present

## 2020-09-10 ENCOUNTER — Telehealth: Payer: Self-pay | Admitting: *Deleted

## 2020-09-10 ENCOUNTER — Ambulatory Visit (HOSPITAL_COMMUNITY): Payer: Medicare Other | Admitting: Hematology

## 2020-09-10 NOTE — Telephone Encounter (Signed)
CALLED PATIENT'S DAUGHTER- MELISSA HAND TO REMIND OF XOFIGO INJ. FOR 09-11-20- ARRIVAL TIME- 12:15 PM @ [facility], LVM FOR A RETURN CALL

## 2020-09-11 ENCOUNTER — Other Ambulatory Visit (HOSPITAL_COMMUNITY): Payer: Self-pay

## 2020-09-11 ENCOUNTER — Encounter (HOSPITAL_COMMUNITY)
Admission: RE | Admit: 2020-09-11 | Discharge: 2020-09-11 | Disposition: A | Discharge status: Home / Self Care | Payer: Medicare Other | Source: Ambulatory Visit | Attending: Radiation Oncology | Admitting: Radiation Oncology

## 2020-09-11 ENCOUNTER — Other Ambulatory Visit: Payer: Self-pay

## 2020-09-11 ENCOUNTER — Inpatient Hospital Stay (HOSPITAL_COMMUNITY): Payer: Medicare Other | Attending: Hematology | Admitting: Hematology

## 2020-09-11 VITALS — BP 105/69 | HR 130 | Temp 97.8°F | Resp 20 | Wt 189.2 lb

## 2020-09-11 DIAGNOSIS — C61 Malignant neoplasm of prostate: Secondary | ICD-10-CM

## 2020-09-11 DIAGNOSIS — T402X5A Adverse effect of other opioids, initial encounter: Secondary | ICD-10-CM | POA: Insufficient documentation

## 2020-09-11 DIAGNOSIS — G893 Neoplasm related pain (acute) (chronic): Secondary | ICD-10-CM | POA: Diagnosis not present

## 2020-09-11 DIAGNOSIS — Z923 Personal history of irradiation: Secondary | ICD-10-CM | POA: Diagnosis not present

## 2020-09-11 DIAGNOSIS — J3489 Other specified disorders of nose and nasal sinuses: Secondary | ICD-10-CM

## 2020-09-11 DIAGNOSIS — Z192 Hormone resistant malignancy status: Secondary | ICD-10-CM | POA: Diagnosis not present

## 2020-09-11 DIAGNOSIS — M25551 Pain in right hip: Secondary | ICD-10-CM | POA: Insufficient documentation

## 2020-09-11 DIAGNOSIS — C7951 Secondary malignant neoplasm of bone: Secondary | ICD-10-CM | POA: Insufficient documentation

## 2020-09-11 DIAGNOSIS — Z86718 Personal history of other venous thrombosis and embolism: Secondary | ICD-10-CM | POA: Diagnosis not present

## 2020-09-11 DIAGNOSIS — G629 Polyneuropathy, unspecified: Secondary | ICD-10-CM | POA: Diagnosis not present

## 2020-09-11 DIAGNOSIS — K5903 Drug induced constipation: Secondary | ICD-10-CM | POA: Insufficient documentation

## 2020-09-11 DIAGNOSIS — F17210 Nicotine dependence, cigarettes, uncomplicated: Secondary | ICD-10-CM | POA: Diagnosis not present

## 2020-09-11 DIAGNOSIS — Z808 Family history of malignant neoplasm of other organs or systems: Secondary | ICD-10-CM | POA: Diagnosis not present

## 2020-09-11 DIAGNOSIS — Z79899 Other long term (current) drug therapy: Secondary | ICD-10-CM | POA: Insufficient documentation

## 2020-09-11 DIAGNOSIS — M549 Dorsalgia, unspecified: Secondary | ICD-10-CM | POA: Diagnosis not present

## 2020-09-11 DIAGNOSIS — Z8042 Family history of malignant neoplasm of prostate: Secondary | ICD-10-CM | POA: Insufficient documentation

## 2020-09-11 DIAGNOSIS — C7989 Secondary malignant neoplasm of other specified sites: Secondary | ICD-10-CM | POA: Diagnosis not present

## 2020-09-11 MED ORDER — RADIUM RA 223 DICHLORIDE 30 MCCI/ML IV SOLN
123.0000 | Freq: Once | INTRAVENOUS | Status: AC | PRN
  Administered 2020-09-11: 123 via INTRAVENOUS

## 2020-09-11 MED ORDER — MISC. DEVICES MISC: 0 refills | Status: AC

## 2020-09-11 MED ORDER — AMOXICILLIN-POT CLAVULANATE 875-125 MG PO TABS: 1.0000 | ORAL_TABLET | Freq: Two times a day (BID) | ORAL | 0 refills | Status: AC

## 2020-09-11 MED ORDER — OXYCODONE HCL 10 MG PO TABS: 15.0000 mg | ORAL_TABLET | Freq: Four times a day (QID) | ORAL | 0 refills | Status: DC | PRN

## 2020-09-11 NOTE — Progress Notes (Signed)
  Radiation Oncology         [phone] ________________________________  Name: Matthew [name] MRN: [id number]  Date: 09/11/2020  DOB: 03-Nov-1955  Radium-223 Infusion Note  Diagnosis:  Castration resistant prostate cancer with painful bone involvement  Current Infusion:    1  Planned Infusions:  6  Narrative: Matthew [name] presented to nuclear medicine for treatment. His most recent blood counts were reviewed.  He remains a good candidate to proceed with Ra-223.  The patient was situated in an infusion suite with a contact barrier placed under his arm. Intravenous access was established, using sterile technique, and a normal saline infusion from a syringe was started.  Micro-dosimetry:  The prescribed radiation activity was assayed and confirmed to be within specified tolerance.  Special Treatment Procedure - Infusion:  The nuclear medicine technologist and I personally verified the dose activity to be delivered as specified in the written directive, and verified the patient identification via 2 separate methods.  The syringe containing the dose was attached to an intravenous access and the dose delivered over a minute. No complications were noted.  The total administered dose was 126.3 microcuries.   A saline flush of the line and the syringe that contained the isotope was then performed.  The residual radioactivity in the syringe was 3.3 microcuries, so the actual infused isotope activity was 123.0 microcuries.   Pressure was applied to the venipuncture site, and a compression bandage placed.   Radiation Safety personnel were present to perform the discharge survey, as detailed on their documentation.   After a short period of observation, the patient had his IV removed.  Impression:  The patient tolerated his infusion relatively well.  Plan:  The patient will return in one month for ongoing care.    ________________________________  Sheral Apley. Tammi Klippel, M.D.

## 2020-09-11 NOTE — Patient Instructions (Addendum)
[place] at [facility] Discharge Instructions  You were seen today by Dr. Delton Coombes. He went over your recent results. Take 2 tablets of Remeron (mirtazapine) at bedtime to help you sleep at night. You will be prescribed Augmentin 875 mg to take for your tender sinus. Continue staying physical active to rehabilitation your muscles and strength. Dr. Delton Coombes will see you back as needed for follow up.   Thank you for choosing [place] at [facility] to provide your oncology and hematology care.  To afford each patient quality time with our provider, please arrive at least 15 minutes before your scheduled appointment time.   If you have a lab appointment with the [facility] please come in thru the Main Entrance and check in at the main information desk  You need to re-schedule your appointment should you arrive 10 or more minutes late.  We strive to give you quality time with our providers, and arriving late affects you and other patients whose appointments are after yours.  Also, if you no show three or more times for appointments you may be dismissed from the clinic at the providers discretion.     Again, thank you for choosing [facility].  Our hope is that these requests will decrease the amount of time that you wait before being seen by our physicians.       _____________________________________________________________  Should you have questions after your visit to [facility], please contact our office at [phone] between the hours of 8:00 a.m. and 4:30 p.m.  Voicemails left after 4:00 p.m. will not be returned until the following business day.  For prescription refill requests, have your pharmacy contact our office and allow 72 hours.    Cancer Center Support Programs:   > Cancer Support Group  2nd Tuesday of the month 1pm-2pm, Journey Room

## 2020-09-11 NOTE — Progress Notes (Signed)
[place], [place] [address]   CLINIC:  Medical Oncology/Hematology  PCP:  [name], [place] / [place] [address] [phone]   REASON FOR VISIT:  Follow-up for metastatic prostate cancer to bone  PRIOR THERAPY:  1. Docetaxelx 6 cyclesfrom 01/07/2016 to 04/21/2016. 2. Olaparib from 07/28/2019 to 11/03/2019 with progression. 3. Docetaxel x 10 cycles from 11/21/2019 to 05/30/2020.  NGS Results: Not done  CURRENT THERAPY: Surveillance  BRIEF ONCOLOGIC HISTORY:  Oncology History  Prostate carcinoma ([place])  12/10/2015 Imaging   CTA chest, multiple thoracic and rib osseous lesions. no primary evident. Atherosclerosis including aortic and CAD   12/12/2015 Imaging   CT abdomen/pelvis with sclerotic osseous lesions througout the lumbar spine, sacrum, bilateral iliac bones and L proximal femur worrisome for sclerotic osseous mets,mild prostatomegaly, no LAD, Infrarenal 3.8 cm AAA   12/12/2015 Tumor Marker   PSA 153.85   12/18/2015 Imaging   Bone Scan Diffuse bony metastatic disease, posterior calvarium, sternum, thoracic, lumbar spine, bilateral ribs, L shoulder, bilateral bony pelvis, proximal L femur   12/24/2015 Initial Biopsy   CT biopsy of L iliac bone lesion   12/26/2015 Pathology Results   Bone, biopsy, left iliac - POSITIVE FOR METASTATIC ADENOCARCINOMA.   12/28/2015 - 09/01/2016 Chemotherapy   Firmagon instituted 240 mg    01/01/2016 Procedure   Port placed by IR.   01/07/2016 - 04/11/2016 Chemotherapy   Docetaxel every 21 days x 6 cycles with dose reductions due to tolerance   02/18/2016 Adverse Reaction   GI toxicity from docetaxel, dose reduced to 60 mg/m2   02/18/2016 Treatment Plan Change   Docetaxel dose reduced by 20%   03/06/2016 Procedure   Dr. Enrique Sack- Multiple extraction of tooth numbers 3, 5, 6, 8, 9, 10, 11, 12, 20, 21, 22, 27, 28, and 31. 4 Quadrants of alveoloplasty. Bilateral mandibular lingual  tori reductions.   04/09/2016 Imaging   Brain MRI 1. No intracranial metastatic disease 2. No acute intracranial abnormality 3. Findings of chronic microvascular disease   05/14/2016 Imaging   CT C/A/P Stable appearing diffuse bony metastatic disease. No new significant disease is seen Innumerable patchy sclerotic osseous metastases throughout the axial and proximal appendicular skeleton, stable in size and distribution, significantly increased in density in the interval, likely reflecting treatment effect. 2. No new or progressive metastatic disease in the chest, abdomen or pelvis. 3. Bilateral perifissural pulmonary nodules are stable and probably benign. 4. Aortic atherosclerosis. Infrarenal 4.3 cm abdominal aortic aneurysm, minimally increased in size. Recommend followup by ultrasound in 1 year.   06/02/2016 Tumor Marker   PSA 0.99 ng/ml    - 07/31/2016 Radiation Therapy   Palliative XRT by Dr. Lianne Cure in [place].    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  The Multi-Gene Panel offered by [facility] includes sequencing and/or deletion duplication testing of the following 85 genes: AIP, ALK, APC, ATM, AXIN2,BAP1,  BARD1, BLM, BMPR1A, BRCA1, BRCA2, BRIP1, CASR, CDC73, CDH1, CDK4, CDKN1B, CDKN1C, CDKN2A (p14ARF), CDKN2A (p16INK4a), CEBPA, CHEK2, CTNNA1, DICER1, DIS3L2, EGFR (c.2369C>T, p.Thr790Met variant only), EPCAM (Deletion/duplication testing only), FH, FLCN, GATA2, GPC3, GREM1 (Promoter region deletion/duplication testing only), HOXB13 (c.251G>A, p.Gly84Glu), HRAS, KIT, MAX, MEN1, MET, MITF (c.952G>A, p.Glu318Lys variant only), MLH1, MSH2, MSH3, MSH6, MUTYH, NBN, NF1, NF2, NTHL1, PALB2, PDGFRA, PHOX2B, PMS2, POLD1, POLE, POT1, PRKAR1A, PTCH1, PTEN, RAD50, RAD51C,  RAD51D, RB1, RECQL4, RET, RNF43, RUNX1, SDHAF2, SDHA (sequence changes only), SDHB, SDHC,  SDHD, SMAD4, SMARCA4, SMARCB1, SMARCE1, STK11, SUFU, TERC, TERT, TMEM127, TP53, TSC1, TSC2, VHL, WRN and WT1.  The report date is August 24, 2018  This is not thought to increase the risk for colon cancer or other cancers, unless there is the presence of two pathogenic mutations.   09/27/2018 - 06/07/2019 Chemotherapy   The patient had palonosetron (ALOXI) injection 0.25 mg, 0.25 mg, Intravenous,  Once, 10 of 10 cycles Administration: 0.25 mg (09/27/2018), 0.25 mg (10/25/2018), 0.25 mg (11/23/2018), 0.25 mg (12/21/2018), 0.25 mg (01/18/2019), 0.25 mg (02/15/2019), 0.25 mg (03/15/2019), 0.25 mg (04/12/2019), 0.25 mg (05/10/2019), 0.25 mg (06/07/2019) pegfilgrastim (NEULASTA ONPRO KIT) injection 6 mg, 6 mg, Subcutaneous, Once, 9 of 9 cycles Administration: 6 mg (10/25/2018), 6 mg (11/23/2018), 6 mg (12/21/2018), 6 mg (01/18/2019), 6 mg (02/15/2019), 6 mg (03/15/2019), 6 mg (04/12/2019), 6 mg (05/10/2019), 6 mg (06/07/2019) pegfilgrastim-cbqv (UDENYCA) injection 6 mg, 6 mg, Subcutaneous, Once, 1 of 1 cycle Administration: 6 mg (09/29/2018) cabazitaxel (JEVTANA) 35 mg in dextrose 5 % 250 mL chemo infusion, 15 mg/m2 = 35 mg (100 % of original dose 15 mg/m2), Intravenous,  Once, 10 of 10 cycles Dose modification: 15 mg/m2 (original dose 15 mg/m2, Cycle 1, Reason: Provider Judgment), 20 mg/m2 (original dose 15 mg/m2, Cycle 2, Reason: Provider Judgment), 20 mg/m2 (original dose 20 mg/m2, Cycle 5, Reason: Patient Age, Comment: Change in jevtana orderable), 15 mg/m2 (75 % of original dose 20 mg/m2, Cycle 8, Reason: Other (see comments), Comment: nausea, worsening numbness) Administration: 35 mg (09/27/2018), 46 mg (10/25/2018), 46 mg (11/23/2018), 46 mg (12/21/2018), 46 mg (01/18/2019), 46 mg (02/15/2019), 46 mg (03/15/2019), 35 mg (04/12/2019), 35 mg (05/10/2019), 35 mg (06/07/2019)  for chemotherapy treatment.    11/21/2019 -  Chemotherapy   The patient had palonosetron (ALOXI) injection 0.25 mg, 0.25 mg, Intravenous,  Once, 10 of 12  cycles Administration: 0.25 mg (11/21/2019), 0.25 mg (12/12/2019), 0.25 mg (01/03/2020), 0.25 mg (01/24/2020), 0.25 mg (02/14/2020), 0.25 mg (03/06/2020), 0.25 mg (03/27/2020), 0.25 mg (04/17/2020), 0.25 mg (05/09/2020), 0.25 mg (05/30/2020) pegfilgrastim (NEULASTA ONPRO KIT) injection 6 mg, 6 mg, Subcutaneous, Once, 10 of 12 cycles Administration: 6 mg (11/21/2019), 6 mg (12/12/2019), 6 mg (01/03/2020), 6 mg (01/24/2020), 6 mg (02/14/2020), 6 mg (03/06/2020), 6 mg (03/27/2020), 6 mg (04/17/2020), 6 mg (05/09/2020), 6 mg (05/30/2020) DOCEtaxel (TAXOTERE) 140 mg in sodium chloride 0.9 % 250 mL chemo infusion, 60 mg/m2 = 140 mg (80 % of original dose 75 mg/m2), Intravenous,  Once, 10 of 12 cycles Dose modification: 60 mg/m2 (80 % of original dose 75 mg/m2, Cycle 1, Reason: Other (see comments), Comment: based on previous tolerabiliity in 2017) Administration: 140 mg (11/21/2019), 140 mg (12/12/2019), 140 mg (01/03/2020), 140 mg (01/24/2020), 140 mg (02/14/2020), 140 mg (03/06/2020), 140 mg (03/27/2020), 140 mg (04/17/2020), 140 mg (05/09/2020), 140 mg (05/30/2020)  for chemotherapy treatment.    Bone metastases ([place])  12/26/2015 Initial Diagnosis   Bone metastases ([place])   09/27/2018 - 06/07/2019 Chemotherapy   The patient had palonosetron (ALOXI) injection 0.25 mg, 0.25 mg, Intravenous,  Once, 10 of 10 cycles Administration: 0.25 mg (09/27/2018), 0.25 mg (10/25/2018), 0.25 mg (11/23/2018), 0.25 mg (12/21/2018), 0.25 mg (01/18/2019), 0.25 mg (02/15/2019), 0.25 mg (03/15/2019), 0.25 mg (04/12/2019), 0.25 mg (05/10/2019), 0.25 mg (06/07/2019) pegfilgrastim (NEULASTA ONPRO KIT) injection 6 mg, 6 mg, Subcutaneous, Once, 9 of 9 cycles Administration: 6 mg (10/25/2018), 6 mg (11/23/2018), 6 mg (12/21/2018), 6 mg (01/18/2019), 6 mg (  02/15/2019), 6 mg (03/15/2019), 6 mg (04/12/2019), 6 mg (05/10/2019), 6 mg (06/07/2019) pegfilgrastim-cbqv (UDENYCA) injection 6 mg, 6 mg, Subcutaneous, Once, 1 of 1 cycle Administration: 6 mg (09/29/2018) cabazitaxel (JEVTANA) 35 mg  in dextrose 5 % 250 mL chemo infusion, 15 mg/m2 = 35 mg (100 % of original dose 15 mg/m2), Intravenous,  Once, 10 of 10 cycles Dose modification: 15 mg/m2 (original dose 15 mg/m2, Cycle 1, Reason: Provider Judgment), 20 mg/m2 (original dose 15 mg/m2, Cycle 2, Reason: Provider Judgment), 20 mg/m2 (original dose 20 mg/m2, Cycle 5, Reason: Patient Age, Comment: Change in jevtana orderable), 15 mg/m2 (75 % of original dose 20 mg/m2, Cycle 8, Reason: Other (see comments), Comment: nausea, worsening numbness) Administration: 35 mg (09/27/2018), 46 mg (10/25/2018), 46 mg (11/23/2018), 46 mg (12/21/2018), 46 mg (01/18/2019), 46 mg (02/15/2019), 46 mg (03/15/2019), 35 mg (04/12/2019), 35 mg (05/10/2019), 35 mg (06/07/2019)  for chemotherapy treatment.    11/21/2019 -  Chemotherapy   The patient had palonosetron (ALOXI) injection 0.25 mg, 0.25 mg, Intravenous,  Once, 10 of 12 cycles Administration: 0.25 mg (11/21/2019), 0.25 mg (12/12/2019), 0.25 mg (01/03/2020), 0.25 mg (01/24/2020), 0.25 mg (02/14/2020), 0.25 mg (03/06/2020), 0.25 mg (03/27/2020), 0.25 mg (04/17/2020), 0.25 mg (05/09/2020), 0.25 mg (05/30/2020) pegfilgrastim (NEULASTA ONPRO KIT) injection 6 mg, 6 mg, Subcutaneous, Once, 10 of 12 cycles Administration: 6 mg (11/21/2019), 6 mg (12/12/2019), 6 mg (01/03/2020), 6 mg (01/24/2020), 6 mg (02/14/2020), 6 mg (03/06/2020), 6 mg (03/27/2020), 6 mg (04/17/2020), 6 mg (05/09/2020), 6 mg (05/30/2020) DOCEtaxel (TAXOTERE) 140 mg in sodium chloride 0.9 % 250 mL chemo infusion, 60 mg/m2 = 140 mg (80 % of original dose 75 mg/m2), Intravenous,  Once, 10 of 12 cycles Dose modification: 60 mg/m2 (80 % of original dose 75 mg/m2, Cycle 1, Reason: Other (see comments), Comment: based on previous tolerabiliity in 2017) Administration: 140 mg (11/21/2019), 140 mg (12/12/2019), 140 mg (01/03/2020), 140 mg (01/24/2020), 140 mg (02/14/2020), 140 mg (03/06/2020), 140 mg (03/27/2020), 140 mg (04/17/2020), 140 mg (05/09/2020), 140 mg (05/30/2020)  for chemotherapy  treatment.      CANCER STAGING: Cancer Staging Prostate carcinoma [facility]) Staging form: Prostate, AJCC 7th Edition - Clinical stage from 12/24/2015: Stage IV (TX, N0, M1b, PSA: 20 or greater) - Signed by Baird Cancer, PA-C on 01/07/2016   INTERVAL HISTORY:  [name], a 65 y.o. male, returns for routine follow-up of his metastatic prostate cancer to bone. Janai was last seen on 08/09/2020.   Today he is accompanied by his daughter and he reports feeling poorly. He fell on Thurdsday, 02/03, on his bed and then while getting into his car due to pain in his right hip and inability to move his right leg as much. He is taking oxycodone 15 mg every 4 hours which is helping control his pain. He also has noticed of having left cheek stabbing pain which started prior to the fall and interfering with his sleep; the pain is similar to the aching in his other joints. His left arm is aching and hurting currently, which is possibly due to moving homes recently. He is taking Remeron and helping his sleep, but is not improving his appetite. He is not currently on any antibiotics. He is having a BM every other day, with 4 BM's today.  He moved in with his daughter. [place] will come on 02/09 to get him situated in her home; she continues working a full-time job in a retinal surgery clinic in [place]. He is requesting  to have a hospital bed, a powerlift chair and shower chair installed at home, a transport wheelchair, a bedside commode and a gait belt.   REVIEW OF SYSTEMS:  Review of Systems  Constitutional: Positive for appetite change (25%).  HENT:         L cheek pain  Gastrointestinal: Positive for constipation.  Musculoskeletal: Positive for arthralgias (8/10 R hip pain).  Psychiatric/Behavioral: Positive for sleep disturbance (d/t stabbing jaw pain).  All other systems reviewed and are negative.   PAST MEDICAL/SURGICAL HISTORY:  Past Medical History:  Diagnosis  Date  . Arthritis    Left knee  . Bone cancer ([place])   . Bone metastases ([place]) 12/26/2015  . Cancer [facility])    Prostate  . DVT (deep venous thrombosis) ([place]) 2004  . Family history of prostate cancer   . Family history of stomach cancer   . Kidney stones    ~2002  . Prostate carcinoma ([place]) 12/26/2015   Past Surgical History:  Procedure Laterality Date  . CIRCUMCISION  30 years ago  . MULTIPLE EXTRACTIONS WITH ALVEOLOPLASTY N/A 03/06/2016   Procedure: Extraction of tooth #'s 3,5,6,8-12,20-22, 27, 28, and 31 with alveoloplasty and bilateral mandibular tori reductions;  Surgeon: Lenn Cal, DDS;  Location: [facility];  Service: Oral Surgery;  Laterality: N/A;    SOCIAL HISTORY:  Social History   Socioeconomic History  . Marital status: Single    Spouse name: Not on file  . Number of children: 2  . Years of education: Not on file  . Highest education level: Not on file  Occupational History  . Occupation: Research scientist (life sciences)    Comment: disabled  Tobacco Use  . Smoking status: Current Every Day Smoker    Packs/day: 0.50    Years: 50.00    Pack years: 25.00    Types: Cigarettes    Start date: 06/16/1965  . Smokeless tobacco: Former Systems developer    Types: [place] date: 08/04/2005  Vaping Use  . Vaping Use: Never used  Substance and Sexual Activity  . Alcohol use: No    Alcohol/week: 0.0 standard drinks  . Drug use: Yes    Types: Marijuana    Comment: THC for appetite; occ  . Sexual activity: Not Currently  Other Topics Concern  . Not on file  Social History Narrative  . Not on file   Social Determinants of Health   Financial Resource Strain: Not on file  Food Insecurity: Not on file  Transportation Needs: Not on file  Physical Activity: Not on file  Stress: Not on file  Social Connections: Not on file  Intimate Partner Violence: Not At Risk  . Fear of Current or Ex-Partner: No  . Emotionally Abused: No  . Physically Abused: No  . Sexually Abused: No     FAMILY HISTORY:  Family History  Problem Relation Age of Onset  . Stomach cancer Mother        dx 68s  . Aneurysm Maternal Aunt        brain  . Alcohol abuse Maternal Uncle   . Stomach cancer Maternal Grandmother        dx 50s-60s  . Prostate cancer Maternal Uncle        dx 87s    CURRENT MEDICATIONS:  Current Outpatient Medications  Medication Sig Dispense Refill  . amoxicillin-clavulanate (AUGMENTIN) 875-125 MG tablet Take 1 tablet by mouth 2 (two) times daily. 10 tablet 0  . docusate sodium (COLACE) 100 MG capsule Take  2 capsules daily to help soften the stool.  It this does not work, you can increase to 2 capsules in the morning and 2 capsules in the evening.  If you do not have a bowel movement please call the [facility]. 30 capsule 1  . fentaNYL (DURAGESIC) 75 MCG/HR Place 1 patch onto the skin every 3 (three) days. 10 patch 0  . gabapentin (NEURONTIN) 300 MG capsule     . Methylnaltrexone Bromide (RELISTOR) 150 MG TABS Take 1 tablet by mouth daily. 30 tablet 0  . mirtazapine (REMERON) 15 MG tablet Take 1 tablet (15 mg total) by mouth at bedtime. 30 tablet 1  . Misc. Devices MISC Please provide patient with- hospital bed, bedside commode, gait belt, power-lift chair, shower chair and lightweight wheelchair for transport   Dx:Z51.1, C61, C79.51 1 each 0  . omeprazole (PRILOSEC) 20 MG capsule Take 1 capsule (20 mg total) by mouth 2 (two) times daily. 60 capsule 2  . ondansetron (ZOFRAN) 8 MG tablet Take every 8 hours as needed 30 tablet 6  . Oxycodone HCl 10 MG TABS Take 1 tablet (10 mg total) by mouth every 6 (six) hours as needed. (Patient taking differently: Take 10 mg by mouth 4 (four) times daily as needed. Pt takes 1 1/2 tablets every 4 hours) 60 tablet 0  . predniSONE (DELTASONE) 5 MG tablet Take 1 tablet (5 mg total) by mouth daily with breakfast. 30 tablet 3  . silodosin (RAPAFLO) 8 MG CAPS capsule Take 1 capsule (8 mg total) by mouth daily with breakfast.  30 capsule 11  . tamsulosin (FLOMAX) 0.4 MG CAPS capsule Take 2 capsules (0.8 mg total) by mouth at bedtime. 60 capsule 2  . cyclobenzaprine (FLEXERIL) 5 MG tablet Take 1 tablet by mouth three times daily as needed for muscle spasm (Patient not taking: Reported on 09/11/2020) 30 tablet 0  . NARCAN 4 MG/0.1ML LIQD nasal spray kit Place 1 spray into the nose as needed.  (Patient not taking: Reported on 09/11/2020)    . prochlorperazine (COMPAZINE) 10 MG tablet Take 1 tablet (10 mg total) by mouth every 6 (six) hours as needed for nausea or vomiting. (Patient not taking: Reported on 09/11/2020) 60 tablet 6   No current facility-administered medications for this visit.   Facility-Administered Medications Ordered in Other Visits  Medication Dose Route Frequency Provider Last Rate Last Admin  . sodium chloride flush (NS) 0.9 % injection 10 mL  10 mL Intracatheter PRN Derek Jack, MD   10 mL at 01/18/19 0815    ALLERGIES:  Allergies  Allergen Reactions  . Xgeva [Denosumab]     Dental issues with previous doses  . Meloxicam Other (See Comments)    Caused stomach bleeding.    PHYSICAL EXAM:  Performance status (ECOG): 1 - Symptomatic but completely ambulatory  Vitals:   09/11/20 1613  BP: 105/69  Pulse: (!) 130  Resp: 20  Temp: 97.8 F (36.6 C)  SpO2: 99%   Wt Readings from Last 3 Encounters:  09/11/20 189 lb 3.2 oz (85.8 kg)  08/28/20 194 lb (88 kg)  08/09/20 194 lb 6.4 oz (88.2 kg)   Physical Exam Vitals reviewed.  Constitutional:      Appearance: Normal appearance.  HENT:     Head:     Jaw: Tenderness (L jaw TTP) present.     Nose:     Left Sinus: Maxillary sinus tenderness (TTP) present.     Mouth/Throat:     Lips:  No lesions.     Mouth: No oral lesions.     Dentition: No gum lesions.     Tongue: No lesions.  Cardiovascular:     Rate and Rhythm: Normal rate and regular rhythm.     Pulses: Normal pulses.     Heart sounds: Normal heart sounds.  Pulmonary:      Effort: Pulmonary effort is normal.     Breath sounds: Normal breath sounds.  Musculoskeletal:     Left shoulder: Bony tenderness (TTP on humeral head) present.  Neurological:     General: No focal deficit present.     Mental Status: He is alert and oriented to person, place, and time.  Psychiatric:        Mood and Affect: Mood normal.        Behavior: Behavior normal.      LABORATORY DATA:  I have reviewed the labs as listed.  CBC Latest Ref Rng & Units 09/04/2020 06/20/2020 05/30/2020  WBC 4.0 - 10.5 K/uL 5.6 9.6 10.1  Hemoglobin 13.0 - 17.0 g/dL 10.2(L) 11.2(L) 11.9(L)  Hematocrit 39.0 - 52.0 % 32.6(L) 34.7(L) 36.6(L)  Platelets 150 - 400 K/uL 242 231 228   CMP Latest Ref Rng & Units 06/20/2020 05/30/2020 05/09/2020  Glucose 70 - 99 mg/dL 119(H) 106(H) 132(H)  BUN 8 - 23 mg/dL _0 Creatinine 0.61 - 1.24 mg/dL 1.00 0.86 0.88  Sodium 135 - 145 mmol/L 138 134(L) 136  Potassium 3.5 - 5.1 mmol/L 3.9 3.7 3.3(L)  Chloride 98 - 111 mmol/L 100 101 100  CO2 22 - 32 mmol/L _1 Calcium 8.9 - 10.3 mg/dL 8.8(L) 8.5(L) 8.7(L)  Total Protein 6.5 - 8.1 g/dL 6.4(L) 6.2(L) 6.1(L)  Total Bilirubin 0.3 - 1.2 mg/dL 0.4 0.4 0.6  Alkaline Phos 38 - 126 U/L 94 97 85  AST 15 - 41 U/L 11(L) 11(L) 10(L)  ALT 0 - 44 U/L _2 DIAGNOSTIC IMAGING:  I have independently reviewed the scans and discussed with the patient. NM XOFIGO INJECTION  Result Date: 09/11/2020  Matthew [name] was injected intravenously in Nuclear Medicine under the supervision of the attending radiologist     ASSESSMENT:  1. 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 -Abiraterone/prednisone, cabazitaxel with progression. -Guardant 360 showed ATM mutation. -Olaparib from 07/28/2019 through 11/03/2019 with progression. -Bone scan on 11/10/2019 showed worsening disease. PSA was 132 on 11/21/2019. -CT scan on 08/11/2019 showed bone  metastasis with no visceral metastasis. -Docetaxel 60 mg per metered square started on 11/21/2019. -Bone scan on 01/23/2020 showed stable osseous metastatic disease. -Bone scan on 05/07/2020 shows multifocal abnormal uptake consistent with widespread bony metastatic disease, stable. No new foci. -CTAP on 07/17/2020 with interval increase in extensive sclerotic bone metastasis.  No evidence of lymphadenopathy or soft tissue metastatic disease. -Bone scan on 07/17/2020 showed persistent multifocal abnormal areas of increased uptake with interval progression of bilateral posterior rib metastasis.   PLAN:  1. 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  His right leg movement has improved slightly. -Received first dose of Xofigo on 09/11/2020. -Reported some pain in the left cheek region.  Also has sinus symptoms.  We will give him Augmentin for 5 days. -His daughter reports that he will be talking/enrolling with palliative care. -He needs home health to help with his weakness and pain. -We have  also given prescription for hospital bed, shower chair, transportation wheelchair and power lift chair.  2. Bone Metastatic Disease: -Denosumab indefinitely held secondary to ONJ.  3. Back Pain: -Continue fentanyl 75 mcg patch. -Continue oxycodone 15 mg every 4 hours as needed. -Hip pain has improved since he started radiation therapy.  4.Opioid constipation: -He is taking Relistor 150 mg half tablet as needed which is helping.  5. Peripheral neuropathy: -Continue gabapentin at bedtime.   Orders placed this encounter:  No orders of the defined types were placed in this encounter.    Derek Jack, MD [place] [phone]   I, Milinda Antis, am acting as a scribe for Dr. Sanda Linger.  I, Derek Jack MD, have reviewed the above documentation for accuracy and  completeness, and I agree with the above.

## 2020-09-12 ENCOUNTER — Other Ambulatory Visit (HOSPITAL_COMMUNITY): Payer: Self-pay

## 2020-09-12 DIAGNOSIS — Z192 Hormone resistant malignancy status: Secondary | ICD-10-CM | POA: Diagnosis not present

## 2020-09-12 DIAGNOSIS — Z515 Encounter for palliative care: Secondary | ICD-10-CM | POA: Diagnosis not present

## 2020-09-12 DIAGNOSIS — C7951 Secondary malignant neoplasm of bone: Secondary | ICD-10-CM

## 2020-09-12 DIAGNOSIS — Z923 Personal history of irradiation: Secondary | ICD-10-CM | POA: Diagnosis not present

## 2020-09-12 DIAGNOSIS — L603 Nail dystrophy: Secondary | ICD-10-CM | POA: Diagnosis not present

## 2020-09-12 DIAGNOSIS — C61 Malignant neoplasm of prostate: Secondary | ICD-10-CM

## 2020-09-12 DIAGNOSIS — C7989 Secondary malignant neoplasm of other specified sites: Secondary | ICD-10-CM | POA: Diagnosis not present

## 2020-09-13 ENCOUNTER — Encounter (HOSPITAL_COMMUNITY): Payer: Self-pay

## 2020-09-13 DIAGNOSIS — C7951 Secondary malignant neoplasm of bone: Secondary | ICD-10-CM | POA: Diagnosis not present

## 2020-09-13 DIAGNOSIS — C7989 Secondary malignant neoplasm of other specified sites: Secondary | ICD-10-CM | POA: Diagnosis not present

## 2020-09-13 DIAGNOSIS — C61 Malignant neoplasm of prostate: Secondary | ICD-10-CM | POA: Diagnosis not present

## 2020-09-13 DIAGNOSIS — Z192 Hormone resistant malignancy status: Secondary | ICD-10-CM | POA: Diagnosis not present

## 2020-09-13 DIAGNOSIS — Z923 Personal history of irradiation: Secondary | ICD-10-CM | POA: Diagnosis not present

## 2020-09-13 NOTE — Progress Notes (Signed)
Patient accepted for home health by [facility]

## 2020-09-14 DIAGNOSIS — C7951 Secondary malignant neoplasm of bone: Secondary | ICD-10-CM | POA: Diagnosis not present

## 2020-09-14 DIAGNOSIS — Z192 Hormone resistant malignancy status: Secondary | ICD-10-CM | POA: Diagnosis not present

## 2020-09-14 DIAGNOSIS — Z923 Personal history of irradiation: Secondary | ICD-10-CM | POA: Diagnosis not present

## 2020-09-14 DIAGNOSIS — C7989 Secondary malignant neoplasm of other specified sites: Secondary | ICD-10-CM | POA: Diagnosis not present

## 2020-09-14 DIAGNOSIS — C61 Malignant neoplasm of prostate: Secondary | ICD-10-CM | POA: Diagnosis not present

## 2020-09-15 DIAGNOSIS — F17221 Nicotine dependence, chewing tobacco, in remission: Secondary | ICD-10-CM | POA: Diagnosis not present

## 2020-09-15 DIAGNOSIS — I2510 Atherosclerotic heart disease of native coronary artery without angina pectoris: Secondary | ICD-10-CM | POA: Diagnosis not present

## 2020-09-15 DIAGNOSIS — K5903 Drug induced constipation: Secondary | ICD-10-CM | POA: Diagnosis not present

## 2020-09-15 DIAGNOSIS — Z9181 History of falling: Secondary | ICD-10-CM | POA: Diagnosis not present

## 2020-09-15 DIAGNOSIS — G479 Sleep disorder, unspecified: Secondary | ICD-10-CM | POA: Diagnosis not present

## 2020-09-15 DIAGNOSIS — T402X5D Adverse effect of other opioids, subsequent encounter: Secondary | ICD-10-CM | POA: Diagnosis not present

## 2020-09-15 DIAGNOSIS — F17210 Nicotine dependence, cigarettes, uncomplicated: Secondary | ICD-10-CM | POA: Diagnosis not present

## 2020-09-15 DIAGNOSIS — C61 Malignant neoplasm of prostate: Secondary | ICD-10-CM | POA: Diagnosis not present

## 2020-09-15 DIAGNOSIS — Z79891 Long term (current) use of opiate analgesic: Secondary | ICD-10-CM | POA: Diagnosis not present

## 2020-09-15 DIAGNOSIS — Z86718 Personal history of other venous thrombosis and embolism: Secondary | ICD-10-CM | POA: Diagnosis not present

## 2020-09-15 DIAGNOSIS — M549 Dorsalgia, unspecified: Secondary | ICD-10-CM | POA: Diagnosis not present

## 2020-09-15 DIAGNOSIS — G629 Polyneuropathy, unspecified: Secondary | ICD-10-CM | POA: Diagnosis not present

## 2020-09-15 DIAGNOSIS — M1712 Unilateral primary osteoarthritis, left knee: Secondary | ICD-10-CM | POA: Diagnosis not present

## 2020-09-15 DIAGNOSIS — Z7982 Long term (current) use of aspirin: Secondary | ICD-10-CM | POA: Diagnosis not present

## 2020-09-15 DIAGNOSIS — C7951 Secondary malignant neoplasm of bone: Secondary | ICD-10-CM | POA: Diagnosis not present

## 2020-09-15 DIAGNOSIS — I700 Atherosclerosis of aorta: Secondary | ICD-10-CM | POA: Diagnosis not present

## 2020-09-17 ENCOUNTER — Ambulatory Visit (HOSPITAL_COMMUNITY): Payer: Medicare Other | Admitting: Hematology

## 2020-09-17 ENCOUNTER — Other Ambulatory Visit (HOSPITAL_COMMUNITY): Payer: Self-pay | Admitting: Radiation Oncology

## 2020-09-17 ENCOUNTER — Encounter (HOSPITAL_COMMUNITY): Payer: Self-pay | Admitting: *Deleted

## 2020-09-17 DIAGNOSIS — Z192 Hormone resistant malignancy status: Secondary | ICD-10-CM | POA: Diagnosis not present

## 2020-09-17 DIAGNOSIS — C61 Malignant neoplasm of prostate: Secondary | ICD-10-CM

## 2020-09-17 DIAGNOSIS — Z923 Personal history of irradiation: Secondary | ICD-10-CM | POA: Diagnosis not present

## 2020-09-17 DIAGNOSIS — C7951 Secondary malignant neoplasm of bone: Secondary | ICD-10-CM | POA: Diagnosis not present

## 2020-09-17 DIAGNOSIS — C7989 Secondary malignant neoplasm of other specified sites: Secondary | ICD-10-CM | POA: Diagnosis not present

## 2020-09-17 NOTE — Progress Notes (Signed)
I spoke with [facility] with [place].  Verbal orders given to her per Dr. Delton Coombes for home health skilled nursing. Okay to see patient once per week for 4 weeks.  She will send over a fax for signature.

## 2020-09-24 ENCOUNTER — Telehealth: Payer: Self-pay | Admitting: *Deleted

## 2020-09-24 NOTE — Telephone Encounter (Signed)
CALLED PATIENT'S DAUGHTER- MELISSA HAND TO INFORM OF LAB AND WEIGHT ON 10-05-20 @ 12 PM @ [place] ON 10-12-20- ARRVIAL TIME- 10:45 @ [facility], SPOKE WITH PATIENT'S DAUGHTER- MELISSA HAND AND SHE IS AWARE OF THESE APPTS.

## 2020-10-01 ENCOUNTER — Telehealth (HOSPITAL_COMMUNITY): Payer: Self-pay | Admitting: *Deleted

## 2020-10-01 DIAGNOSIS — M79675 Pain in left toe(s): Secondary | ICD-10-CM | POA: Diagnosis not present

## 2020-10-01 DIAGNOSIS — M79674 Pain in right toe(s): Secondary | ICD-10-CM | POA: Diagnosis not present

## 2020-10-01 DIAGNOSIS — B351 Tinea unguium: Secondary | ICD-10-CM | POA: Diagnosis not present

## 2020-10-01 DIAGNOSIS — I739 Peripheral vascular disease, unspecified: Secondary | ICD-10-CM | POA: Diagnosis not present

## 2020-10-02 ENCOUNTER — Other Ambulatory Visit (HOSPITAL_COMMUNITY): Payer: Self-pay | Admitting: Surgery

## 2020-10-02 DIAGNOSIS — C61 Malignant neoplasm of prostate: Secondary | ICD-10-CM

## 2020-10-02 DIAGNOSIS — G893 Neoplasm related pain (acute) (chronic): Secondary | ICD-10-CM

## 2020-10-02 MED ORDER — FENTANYL 75 MCG/HR TD PT72: 1.0000 | MEDICATED_PATCH | TRANSDERMAL | 0 refills | Status: DC

## 2020-10-03 ENCOUNTER — Encounter (HOSPITAL_COMMUNITY): Payer: Self-pay

## 2020-10-04 ENCOUNTER — Telehealth: Payer: Self-pay | Admitting: *Deleted

## 2020-10-04 ENCOUNTER — Other Ambulatory Visit: Payer: Self-pay | Admitting: Radiation Oncology

## 2020-10-04 DIAGNOSIS — C7951 Secondary malignant neoplasm of bone: Secondary | ICD-10-CM

## 2020-10-04 NOTE — Telephone Encounter (Signed)
Called patient to remind of lab and weight appt. for 10-05-20 @ 12 pm @ [facility], lvm for a return call

## 2020-10-05 ENCOUNTER — Ambulatory Visit
Admission: RE | Admit: 2020-10-05 | Discharge: 2020-10-05 | Disposition: A | Discharge status: Home / Self Care | Payer: Medicare Other | Source: Ambulatory Visit | Attending: Radiation Oncology | Admitting: Radiation Oncology

## 2020-10-05 ENCOUNTER — Other Ambulatory Visit: Payer: Self-pay

## 2020-10-05 ENCOUNTER — Encounter: Payer: Self-pay | Admitting: Radiation Oncology

## 2020-10-05 DIAGNOSIS — C7952 Secondary malignant neoplasm of bone marrow: Secondary | ICD-10-CM | POA: Insufficient documentation

## 2020-10-05 DIAGNOSIS — C7951 Secondary malignant neoplasm of bone: Secondary | ICD-10-CM

## 2020-10-05 LAB — CBC WITH DIFFERENTIAL (CANCER CENTER ONLY)
Abs Immature Granulocytes: 0.14 10*3/uL — ABNORMAL HIGH (ref 0.00–0.07)
Basophils Absolute: 0 10*3/uL (ref 0.0–0.1)
Basophils Relative: 0 %
Eosinophils Absolute: 0 10*3/uL (ref 0.0–0.5)
Eosinophils Relative: 1 %
HCT: 26.2 % — ABNORMAL LOW (ref 39.0–52.0)
Hemoglobin: 8.2 g/dL — ABNORMAL LOW (ref 13.0–17.0)
Immature Granulocytes: 4 %
Lymphocytes Relative: 12 %
Lymphs Abs: 0.4 10*3/uL — ABNORMAL LOW (ref 0.7–4.0)
MCH: 25 pg — ABNORMAL LOW (ref 26.0–34.0)
MCHC: 31.3 g/dL (ref 30.0–36.0)
MCV: 79.9 fL — ABNORMAL LOW (ref 80.0–100.0)
Monocytes Absolute: 0.3 10*3/uL (ref 0.1–1.0)
Monocytes Relative: 8 %
Neutro Abs: 2.4 10*3/uL (ref 1.7–7.7)
Neutrophils Relative %: 75 %
Platelet Count: 150 10*3/uL (ref 150–400)
RBC: 3.28 MIL/uL — ABNORMAL LOW (ref 4.22–5.81)
RDW: 20.8 % — ABNORMAL HIGH (ref 11.5–15.5)
WBC Count: 3.2 10*3/uL — ABNORMAL LOW (ref 4.0–10.5)
nRBC: 0 % (ref 0.0–0.2)

## 2020-10-05 NOTE — Progress Notes (Signed)
Phoned Matthew [name] in nuclear med and informed her that the patient's weight today was 187 lb. Weight and labs collected today in preparation for upcoming Xofigo injection.

## 2020-10-08 ENCOUNTER — Telehealth: Payer: Self-pay | Admitting: *Deleted

## 2020-10-08 NOTE — Telephone Encounter (Signed)
Called patient's daughter- Melissa Hand to inform that Xofigo inj. has been cancelled for -10-12-20 due to his hgb. dropping, lvm for a return call

## 2020-10-11 ENCOUNTER — Other Ambulatory Visit (HOSPITAL_COMMUNITY): Payer: Self-pay | Admitting: Hematology

## 2020-10-11 ENCOUNTER — Telehealth: Payer: Self-pay | Admitting: Radiation Oncology

## 2020-10-11 NOTE — Telephone Encounter (Signed)
Received call from patient's daughter, Lenna Sciara, requesting more details about her father's xofigo injection being cancelled. Phoned her back to explain. No answer. Left voicemail explaining her father's hemoglobin dropped two points from last month most likely related to the xofigo injection. Explained that the drop resulted in his injection being cancelled to allow time for his cells to rebuild. Explained normal hemoglobin range is 13-17 though her father's normal is more like 10 or 11. Explained his current hemoglobin is 8.2. Explained he would benefit from supplementing his diet with iron rich foods. Explained a supplement has not been recommended by Dr. Tammi Klippel. Explained that her father may have the following symptoms related to his low hemoglobin: fatigue, weakness, pale skin, headache, and cold hands/feet. Explained transfusions are only recommended if the patient's hemoglobin is less than 7 and the patient is symptomatic. Provided my direct office number for further questions.

## 2020-10-12 ENCOUNTER — Ambulatory Visit (HOSPITAL_COMMUNITY): Admission: RE | Admit: 2020-10-12 | Payer: Medicare Other | Source: Ambulatory Visit

## 2020-10-15 DIAGNOSIS — Z515 Encounter for palliative care: Secondary | ICD-10-CM | POA: Diagnosis not present

## 2020-10-15 DIAGNOSIS — Z192 Hormone resistant malignancy status: Secondary | ICD-10-CM | POA: Diagnosis not present

## 2020-10-15 DIAGNOSIS — C61 Malignant neoplasm of prostate: Secondary | ICD-10-CM | POA: Diagnosis not present

## 2020-10-15 DIAGNOSIS — C7951 Secondary malignant neoplasm of bone: Secondary | ICD-10-CM | POA: Diagnosis not present

## 2020-10-19 ENCOUNTER — Telehealth: Payer: Self-pay | Admitting: *Deleted

## 2020-10-19 NOTE — Telephone Encounter (Signed)
CALLED PATIENT'S DAUGHTER MELISSA HAND TO INFORM OF LAB AND WEIGHT ON 11-02-20 @ 12:15 PM @ [place]. ON 11-09-20- ARRIVAL TIME- 11:45 AM @ [facility], SPOKE WITH PATIENT'S DAUGHTER- MELISSA HAND AND SHE IS AWARE OF THESE APPTS.

## 2020-10-25 DIAGNOSIS — C61 Malignant neoplasm of prostate: Secondary | ICD-10-CM | POA: Diagnosis not present

## 2020-10-25 DIAGNOSIS — Z192 Hormone resistant malignancy status: Secondary | ICD-10-CM | POA: Diagnosis not present

## 2020-10-25 DIAGNOSIS — C7989 Secondary malignant neoplasm of other specified sites: Secondary | ICD-10-CM | POA: Diagnosis not present

## 2020-10-25 DIAGNOSIS — Z923 Personal history of irradiation: Secondary | ICD-10-CM | POA: Diagnosis not present

## 2020-10-25 DIAGNOSIS — C7951 Secondary malignant neoplasm of bone: Secondary | ICD-10-CM | POA: Diagnosis not present

## 2020-10-29 ENCOUNTER — Other Ambulatory Visit (HOSPITAL_COMMUNITY): Payer: Self-pay | Admitting: Surgery

## 2020-10-29 DIAGNOSIS — C61 Malignant neoplasm of prostate: Secondary | ICD-10-CM

## 2020-10-29 DIAGNOSIS — C7951 Secondary malignant neoplasm of bone: Secondary | ICD-10-CM

## 2020-10-29 MED ORDER — OXYCODONE HCL 10 MG PO TABS: 15.0000 mg | ORAL_TABLET | Freq: Four times a day (QID) | ORAL | 0 refills | Status: AC | PRN

## 2020-11-01 ENCOUNTER — Other Ambulatory Visit: Payer: Self-pay | Admitting: Radiation Oncology

## 2020-11-01 ENCOUNTER — Telehealth: Payer: Self-pay | Admitting: *Deleted

## 2020-11-01 DIAGNOSIS — C7951 Secondary malignant neoplasm of bone: Secondary | ICD-10-CM

## 2020-11-01 NOTE — Telephone Encounter (Signed)
CALLED PATIENT'S DAUGHTER- MELISSA HAND TO REMIND OF LAB AND WEIGHT FOR 11-02-20 @ 12:15 PM, LVM FOR A RETURN CALL

## 2020-11-02 ENCOUNTER — Other Ambulatory Visit: Payer: Self-pay

## 2020-11-02 ENCOUNTER — Ambulatory Visit
Admission: RE | Admit: 2020-11-02 | Discharge: 2020-11-02 | Disposition: A | Discharge status: Home / Self Care | Payer: Medicare Other | Source: Ambulatory Visit | Attending: Radiation Oncology | Admitting: Radiation Oncology

## 2020-11-02 DIAGNOSIS — C7951 Secondary malignant neoplasm of bone: Secondary | ICD-10-CM | POA: Diagnosis not present

## 2020-11-02 DIAGNOSIS — C61 Malignant neoplasm of prostate: Secondary | ICD-10-CM | POA: Insufficient documentation

## 2020-11-02 LAB — CBC WITH DIFFERENTIAL (CANCER CENTER ONLY)
Abs Immature Granulocytes: 0.18 10*3/uL — ABNORMAL HIGH (ref 0.00–0.07)
Basophils Absolute: 0 10*3/uL (ref 0.0–0.1)
Basophils Relative: 0 %
Eosinophils Absolute: 0 10*3/uL (ref 0.0–0.5)
Eosinophils Relative: 1 %
HCT: 23.1 % — ABNORMAL LOW (ref 39.0–52.0)
Hemoglobin: 7.1 g/dL — ABNORMAL LOW (ref 13.0–17.0)
Immature Granulocytes: 5 %
Lymphocytes Relative: 16 %
Lymphs Abs: 0.6 10*3/uL — ABNORMAL LOW (ref 0.7–4.0)
MCH: 24.9 pg — ABNORMAL LOW (ref 26.0–34.0)
MCHC: 30.7 g/dL (ref 30.0–36.0)
MCV: 81.1 fL (ref 80.0–100.0)
Monocytes Absolute: 0.4 10*3/uL (ref 0.1–1.0)
Monocytes Relative: 10 %
Neutro Abs: 2.8 10*3/uL (ref 1.7–7.7)
Neutrophils Relative %: 68 %
Platelet Count: 137 10*3/uL — ABNORMAL LOW (ref 150–400)
RBC: 2.85 MIL/uL — ABNORMAL LOW (ref 4.22–5.81)
RDW: 23.2 % — ABNORMAL HIGH (ref 11.5–15.5)
WBC Count: 4 10*3/uL (ref 4.0–10.5)
nRBC: 2 % — ABNORMAL HIGH (ref 0.0–0.2)

## 2020-11-05 ENCOUNTER — Other Ambulatory Visit (HOSPITAL_COMMUNITY): Payer: Self-pay | Admitting: *Deleted

## 2020-11-05 DIAGNOSIS — C61 Malignant neoplasm of prostate: Secondary | ICD-10-CM

## 2020-11-05 DIAGNOSIS — G893 Neoplasm related pain (acute) (chronic): Secondary | ICD-10-CM

## 2020-11-05 MED ORDER — FENTANYL 75 MCG/HR TD PT72: 1.0000 | MEDICATED_PATCH | TRANSDERMAL | 0 refills | Status: AC

## 2020-11-06 ENCOUNTER — Telehealth: Payer: Self-pay | Admitting: Radiation Oncology

## 2020-11-06 ENCOUNTER — Telehealth: Payer: Self-pay | Admitting: *Deleted

## 2020-11-06 NOTE — Telephone Encounter (Signed)
Called patient's daughter- Lenna Sciara Hand to inform that Xofigo inj. would not happen this Friday April 8, lvm for a return call

## 2020-11-06 NOTE — Telephone Encounter (Signed)
-----   Message from Tyler Pita, MD sent at 11/04/2020  2:20 PM EDT ----- Regarding: RE: Matthew [name] with a Hgb of 7.1 Thanks Sam, Agree with cancelling Xofigo for 4/8 and copying on this reply Dr. Delton Coombes.  His Hgb was low in March also.  I suspect he won't be eligible for further Xofigo. MM  ----- Message ----- From: Heywood Footman, RN Sent: 11/02/2020   3:29 PM EDT To: Tyler Pita, MD, Freeman Caldron, PA-C, # Subject: Matthew [name] with a Hgb of 7.1                       [place].   Please defer to Dr. Tammi Klippel on this but this patient's hemoglobin is less than 9 at 7.1. I believe his Xofigo injection for 11/09/2020 should be cancelled. This would be his second injection. Please confirm with Dr. Tammi Klippel.   Sam

## 2020-11-06 NOTE — Telephone Encounter (Signed)
Received voicemail message from patient's daughter, Lenna Sciara, inquiring why her father's Trudi Ida injection was cancelled a second time. Phoned Melissa back to explain. No answer. Left detailed voicemail message explaining her father's hemoglobin was low thus xofigo had to be cancelled. Explained that Dr. Tammi Klippel contact Dr. Raliegh Ip with our findings. Encouraged Melissa to reach out to Dr. Marthann Schiller office to determine next steps. Provided Melissa with my direct number and encouraged her to phone back with additional questions.  Lenna Sciara has told this RN in the past that because of work she is unable to answer her phone at time and to leave a Dance movement psychotherapist.

## 2020-11-07 ENCOUNTER — Other Ambulatory Visit: Payer: Self-pay

## 2020-11-07 ENCOUNTER — Encounter (HOSPITAL_COMMUNITY): Payer: Self-pay

## 2020-11-07 ENCOUNTER — Other Ambulatory Visit (HOSPITAL_COMMUNITY): Payer: Self-pay

## 2020-11-07 ENCOUNTER — Inpatient Hospital Stay (HOSPITAL_COMMUNITY): Payer: Medicare Other | Attending: Hematology

## 2020-11-07 DIAGNOSIS — C61 Malignant neoplasm of prostate: Secondary | ICD-10-CM | POA: Diagnosis not present

## 2020-11-07 DIAGNOSIS — C7951 Secondary malignant neoplasm of bone: Secondary | ICD-10-CM

## 2020-11-07 LAB — COMPREHENSIVE METABOLIC PANEL
ALT: 14 U/L (ref 0–44)
AST: 20 U/L (ref 15–41)
Albumin: 2.5 g/dL — ABNORMAL LOW (ref 3.5–5.0)
Alkaline Phosphatase: 293 U/L — ABNORMAL HIGH (ref 38–126)
Anion gap: 17 — ABNORMAL HIGH (ref 5–15)
BUN: 14 mg/dL (ref 8–23)
CO2: 19 mmol/L — ABNORMAL LOW (ref 22–32)
Calcium: 8.3 mg/dL — ABNORMAL LOW (ref 8.9–10.3)
Chloride: 98 mmol/L (ref 98–111)
Creatinine, Ser: 0.76 mg/dL (ref 0.61–1.24)
GFR, Estimated: >60 mL/min (ref >60)
Glucose, Bld: 102 mg/dL — ABNORMAL HIGH (ref 70–99)
Potassium: 4 mmol/L (ref 3.5–5.1)
Sodium: 134 mmol/L — ABNORMAL LOW (ref 135–145)
Total Bilirubin: 0.6 mg/dL (ref 0.3–1.2)
Total Protein: 5.7 g/dL — ABNORMAL LOW (ref 6.5–8.1)

## 2020-11-07 LAB — CBC WITH DIFFERENTIAL/PLATELET
Abs Immature Granulocytes: 0.16 10*3/uL — ABNORMAL HIGH (ref 0.00–0.07)
Basophils Absolute: 0 10*3/uL (ref 0.0–0.1)
Basophils Relative: 1 %
Eosinophils Absolute: 0 10*3/uL (ref 0.0–0.5)
Eosinophils Relative: 1 %
HCT: 24.6 % — ABNORMAL LOW (ref 39.0–52.0)
Hemoglobin: 7.1 g/dL — ABNORMAL LOW (ref 13.0–17.0)
Immature Granulocytes: 4 %
Lymphocytes Relative: 9 %
Lymphs Abs: 0.4 10*3/uL — ABNORMAL LOW (ref 0.7–4.0)
MCH: 25.3 pg — ABNORMAL LOW (ref 26.0–34.0)
MCHC: 28.9 g/dL — ABNORMAL LOW (ref 30.0–36.0)
MCV: 87.5 fL (ref 80.0–100.0)
Monocytes Absolute: 0.4 10*3/uL (ref 0.1–1.0)
Monocytes Relative: 9 %
Neutro Abs: 3.2 10*3/uL (ref 1.7–7.7)
Neutrophils Relative %: 76 %
Platelets: 141 10*3/uL — ABNORMAL LOW (ref 150–400)
RBC: 2.81 MIL/uL — ABNORMAL LOW (ref 4.22–5.81)
RDW: 23.9 % — ABNORMAL HIGH (ref 11.5–15.5)
WBC: 4.1 10*3/uL (ref 4.0–10.5)
nRBC: 1.7 % — ABNORMAL HIGH (ref 0.0–0.2)

## 2020-11-07 LAB — MAGNESIUM: Magnesium: 1.8 mg/dL (ref 1.7–2.4)

## 2020-11-07 LAB — PSA: Prostatic Specific Antigen: 878 ng/mL — ABNORMAL HIGH (ref 0.00–4.00)

## 2020-11-07 LAB — SAMPLE TO BLOOD BANK

## 2020-11-07 NOTE — Progress Notes (Signed)
Call received from patient's palliative NP asking that I reach out to the patient's daughter regarding the need for blood. Call placed to [facility], patient's daughter who reports that the previous two radium223 treatments have been held for hgb of 8.2 in March and 7.1 on 4/1. I explained that blood transfusions are given in the case of a hgb less than 7, she verbalized understanding. I arranged for lab work today, including a sample to blood bank and explained that a blood transfusion can be arranged if warranted in today's lab. MD aware.

## 2020-11-07 NOTE — Progress Notes (Signed)
LATE ENTRY:  At about 1210, follow-up phone call received from patient's daughter stating that she has just spoken to the patient via telephone and says that he has just woken up and is stating that he does not feel well and "something is wrong." She is unable to elaborate. She states that she feels he needs to see a MD today. I encourage her to take him to urgent care or to the ED if he feels that he needs immediate attention. Melissa verbalized understanding.

## 2020-11-08 ENCOUNTER — Encounter (HOSPITAL_COMMUNITY): Payer: Self-pay

## 2020-11-08 NOTE — Progress Notes (Signed)
MD reviewed labs. Unable to transfuse blood at this time due to Hgb of 7.1. I have called the patient's daughter and reviewed results of lab work. Daughter verbalized understanding that there is not a current indication for blood transfusion. Discussed goals of care with daughter. Daughter states that she does not feel that her dad will be a candidate for any future Xofigo treatment with his current Hgb trends. She states that she and her dad have discussed next steps. States that they understand Hospice care is next but that they want to make that decision when they are ready. I encourage her to have continued conversations to ensure that the patient's wishes are upheld and discuss that hospice can be beneficial when focusing on quality of life. Daughter agrees and states that she does not feel it is time to transition from palliative care to hospice care, but that she feels like she and her dad have had enough conversations that she could make that decision when the time comes. I offer the daughter an appt with Dr. Delton Coombes to discuss further and she declines. Daughter appreciative of my call. I encourage her to call with any future questions or concerns. I updated Kim, Palliative Care NP of this conversation who is scheduled to meet with the patient on Monday and will discuss goals of care further when the patient and daughter.

## 2020-11-09 ENCOUNTER — Ambulatory Visit (HOSPITAL_COMMUNITY): Admission: RE | Admit: 2020-11-09 | Payer: Medicare Other | Source: Ambulatory Visit

## 2021-01-02 DEATH — deceased
